# Patient Record
Sex: Male | Born: 1940 | Race: White | Hispanic: No | Marital: Married | Smoking: Former smoker
Health system: Southern US, Community
[De-identification: ages and names within clinical notes are randomized; demographics above are authoritative.]

## PROBLEM LIST (undated history)

## (undated) DIAGNOSIS — E119 Type 2 diabetes mellitus without complications: Secondary | ICD-10-CM

## (undated) DIAGNOSIS — N400 Enlarged prostate without lower urinary tract symptoms: Secondary | ICD-10-CM

## (undated) DIAGNOSIS — I2699 Other pulmonary embolism without acute cor pulmonale: Secondary | ICD-10-CM

## (undated) DIAGNOSIS — C4490 Unspecified malignant neoplasm of skin, unspecified: Secondary | ICD-10-CM

## (undated) DIAGNOSIS — I6381 Other cerebral infarction due to occlusion or stenosis of small artery: Secondary | ICD-10-CM

## (undated) DIAGNOSIS — C189 Malignant neoplasm of colon, unspecified: Secondary | ICD-10-CM

## (undated) DIAGNOSIS — K7460 Unspecified cirrhosis of liver: Secondary | ICD-10-CM

## (undated) DIAGNOSIS — I4891 Unspecified atrial fibrillation: Secondary | ICD-10-CM

## (undated) DIAGNOSIS — E039 Hypothyroidism, unspecified: Secondary | ICD-10-CM

## (undated) DIAGNOSIS — I499 Cardiac arrhythmia, unspecified: Secondary | ICD-10-CM

## (undated) DIAGNOSIS — I4892 Unspecified atrial flutter: Secondary | ICD-10-CM

## (undated) DIAGNOSIS — D649 Anemia, unspecified: Secondary | ICD-10-CM

## (undated) HISTORY — DX: Hypothyroidism, unspecified: E03.9

## (undated) HISTORY — PX: COLOSTOMY: SHX63

## (undated) HISTORY — DX: Other cerebral infarction due to occlusion or stenosis of small artery: I63.81

## (undated) HISTORY — DX: Unspecified malignant neoplasm of skin, unspecified: C44.90

## (undated) HISTORY — DX: Benign prostatic hyperplasia without lower urinary tract symptoms: N40.0

## (undated) HISTORY — DX: Other pulmonary embolism without acute cor pulmonale: I26.99

## (undated) SURGERY — MINOR REMOVAL PORT-A-CATH
Anesthesia: LOCAL

---

## 2005-08-09 HISTORY — PX: LAPAROSCOPIC CHOLECYSTECTOMY: SUR755

## 2006-08-09 HISTORY — PX: COLECTOMY: SHX59

## 2007-01-12 DIAGNOSIS — C189 Malignant neoplasm of colon, unspecified: Secondary | ICD-10-CM | POA: Insufficient documentation

## 2007-02-13 ENCOUNTER — Ambulatory Visit (HOSPITAL_COMMUNITY): Admission: RE | Admit: 2007-02-13 | Discharge: 2007-02-13 | Payer: Self-pay

## 2007-08-10 HISTORY — PX: ABDOMINOPERINEAL PROCTOCOLECTOMY: SUR8

## 2007-08-29 ENCOUNTER — Ambulatory Visit (HOSPITAL_COMMUNITY): Admission: RE | Admit: 2007-08-29 | Discharge: 2007-08-29 | Payer: Self-pay | Admitting: Internal Medicine

## 2007-12-11 ENCOUNTER — Ambulatory Visit: Admission: RE | Admit: 2007-12-11 | Discharge: 2008-02-16 | Payer: Self-pay | Admitting: Radiation Oncology

## 2008-05-20 ENCOUNTER — Ambulatory Visit (HOSPITAL_COMMUNITY): Admission: RE | Admit: 2008-05-20 | Discharge: 2008-05-20 | Payer: Self-pay | Admitting: Internal Medicine

## 2009-04-15 ENCOUNTER — Ambulatory Visit (HOSPITAL_COMMUNITY): Admission: RE | Admit: 2009-04-15 | Discharge: 2009-04-15 | Payer: Self-pay | Admitting: Internal Medicine

## 2009-06-09 HISTORY — PX: OTHER SURGICAL HISTORY: SHX169

## 2012-02-25 ENCOUNTER — Encounter: Payer: Self-pay | Admitting: Internal Medicine

## 2012-02-25 DIAGNOSIS — C189 Malignant neoplasm of colon, unspecified: Secondary | ICD-10-CM

## 2012-11-07 DIAGNOSIS — I6381 Other cerebral infarction due to occlusion or stenosis of small artery: Secondary | ICD-10-CM

## 2012-11-07 HISTORY — DX: Other cerebral infarction due to occlusion or stenosis of small artery: I63.81

## 2013-02-26 DIAGNOSIS — C189 Malignant neoplasm of colon, unspecified: Secondary | ICD-10-CM

## 2015-08-10 HISTORY — PX: EXPLORATORY LAPAROTOMY: SUR591

## 2015-08-28 DIAGNOSIS — H2513 Age-related nuclear cataract, bilateral: Secondary | ICD-10-CM | POA: Diagnosis not present

## 2015-10-09 DIAGNOSIS — R509 Fever, unspecified: Secondary | ICD-10-CM | POA: Diagnosis not present

## 2015-10-09 DIAGNOSIS — R05 Cough: Secondary | ICD-10-CM | POA: Diagnosis not present

## 2015-10-09 DIAGNOSIS — Z299 Encounter for prophylactic measures, unspecified: Secondary | ICD-10-CM | POA: Diagnosis not present

## 2015-10-09 DIAGNOSIS — J3489 Other specified disorders of nose and nasal sinuses: Secondary | ICD-10-CM | POA: Diagnosis not present

## 2015-10-09 DIAGNOSIS — Z87891 Personal history of nicotine dependence: Secondary | ICD-10-CM | POA: Diagnosis not present

## 2015-10-09 DIAGNOSIS — J329 Chronic sinusitis, unspecified: Secondary | ICD-10-CM | POA: Diagnosis not present

## 2015-10-29 DIAGNOSIS — E669 Obesity, unspecified: Secondary | ICD-10-CM | POA: Diagnosis not present

## 2015-10-29 DIAGNOSIS — K469 Unspecified abdominal hernia without obstruction or gangrene: Secondary | ICD-10-CM | POA: Diagnosis not present

## 2015-10-29 DIAGNOSIS — Z933 Colostomy status: Secondary | ICD-10-CM | POA: Diagnosis not present

## 2015-10-29 DIAGNOSIS — E119 Type 2 diabetes mellitus without complications: Secondary | ICD-10-CM | POA: Diagnosis not present

## 2015-10-29 DIAGNOSIS — R188 Other ascites: Secondary | ICD-10-CM | POA: Diagnosis not present

## 2015-10-29 DIAGNOSIS — I10 Essential (primary) hypertension: Secondary | ICD-10-CM | POA: Diagnosis not present

## 2015-10-29 DIAGNOSIS — R1084 Generalized abdominal pain: Secondary | ICD-10-CM | POA: Diagnosis not present

## 2015-10-29 DIAGNOSIS — R7989 Other specified abnormal findings of blood chemistry: Secondary | ICD-10-CM | POA: Diagnosis not present

## 2015-10-29 DIAGNOSIS — Z7982 Long term (current) use of aspirin: Secondary | ICD-10-CM | POA: Diagnosis not present

## 2015-10-29 DIAGNOSIS — K5900 Constipation, unspecified: Secondary | ICD-10-CM | POA: Diagnosis not present

## 2015-10-29 DIAGNOSIS — E039 Hypothyroidism, unspecified: Secondary | ICD-10-CM | POA: Diagnosis not present

## 2015-10-29 DIAGNOSIS — R61 Generalized hyperhidrosis: Secondary | ICD-10-CM | POA: Diagnosis not present

## 2015-10-29 DIAGNOSIS — R109 Unspecified abdominal pain: Secondary | ICD-10-CM | POA: Diagnosis not present

## 2015-10-29 DIAGNOSIS — K449 Diaphragmatic hernia without obstruction or gangrene: Secondary | ICD-10-CM | POA: Diagnosis not present

## 2015-10-29 DIAGNOSIS — Z79899 Other long term (current) drug therapy: Secondary | ICD-10-CM | POA: Diagnosis not present

## 2015-10-29 DIAGNOSIS — Z8249 Family history of ischemic heart disease and other diseases of the circulatory system: Secondary | ICD-10-CM | POA: Diagnosis not present

## 2015-10-29 DIAGNOSIS — N179 Acute kidney failure, unspecified: Secondary | ICD-10-CM | POA: Diagnosis not present

## 2015-10-29 DIAGNOSIS — Z9049 Acquired absence of other specified parts of digestive tract: Secondary | ICD-10-CM | POA: Diagnosis not present

## 2015-10-29 DIAGNOSIS — R338 Other retention of urine: Secondary | ICD-10-CM | POA: Diagnosis not present

## 2015-10-29 DIAGNOSIS — Z87891 Personal history of nicotine dependence: Secondary | ICD-10-CM | POA: Diagnosis not present

## 2015-10-29 DIAGNOSIS — R339 Retention of urine, unspecified: Secondary | ICD-10-CM | POA: Diagnosis not present

## 2015-10-29 DIAGNOSIS — Z7984 Long term (current) use of oral hypoglycemic drugs: Secondary | ICD-10-CM | POA: Diagnosis not present

## 2015-10-29 DIAGNOSIS — Z85038 Personal history of other malignant neoplasm of large intestine: Secondary | ICD-10-CM | POA: Diagnosis not present

## 2015-10-29 DIAGNOSIS — N400 Enlarged prostate without lower urinary tract symptoms: Secondary | ICD-10-CM | POA: Diagnosis not present

## 2015-10-29 DIAGNOSIS — Z6829 Body mass index (BMI) 29.0-29.9, adult: Secondary | ICD-10-CM | POA: Diagnosis not present

## 2015-10-30 DIAGNOSIS — K631 Perforation of intestine (nontraumatic): Secondary | ICD-10-CM | POA: Diagnosis not present

## 2015-10-30 DIAGNOSIS — R1084 Generalized abdominal pain: Secondary | ICD-10-CM | POA: Diagnosis not present

## 2015-10-30 DIAGNOSIS — Z9049 Acquired absence of other specified parts of digestive tract: Secondary | ICD-10-CM | POA: Diagnosis not present

## 2015-10-30 DIAGNOSIS — Z7982 Long term (current) use of aspirin: Secondary | ICD-10-CM | POA: Diagnosis not present

## 2015-10-30 DIAGNOSIS — Z9989 Dependence on other enabling machines and devices: Secondary | ICD-10-CM | POA: Diagnosis not present

## 2015-10-30 DIAGNOSIS — Z933 Colostomy status: Secondary | ICD-10-CM | POA: Diagnosis not present

## 2015-10-30 DIAGNOSIS — R1033 Periumbilical pain: Secondary | ICD-10-CM | POA: Diagnosis not present

## 2015-10-30 DIAGNOSIS — R509 Fever, unspecified: Secondary | ICD-10-CM | POA: Diagnosis not present

## 2015-10-30 DIAGNOSIS — K668 Other specified disorders of peritoneum: Secondary | ICD-10-CM | POA: Diagnosis not present

## 2015-10-30 DIAGNOSIS — I10 Essential (primary) hypertension: Secondary | ICD-10-CM | POA: Diagnosis not present

## 2015-10-30 DIAGNOSIS — E876 Hypokalemia: Secondary | ICD-10-CM | POA: Diagnosis not present

## 2015-10-30 DIAGNOSIS — E039 Hypothyroidism, unspecified: Secondary | ICD-10-CM | POA: Diagnosis not present

## 2015-10-30 DIAGNOSIS — R066 Hiccough: Secondary | ICD-10-CM | POA: Diagnosis not present

## 2015-10-30 DIAGNOSIS — R339 Retention of urine, unspecified: Secondary | ICD-10-CM | POA: Diagnosis not present

## 2015-10-30 DIAGNOSIS — K659 Peritonitis, unspecified: Secondary | ICD-10-CM | POA: Diagnosis not present

## 2015-10-30 DIAGNOSIS — Z79899 Other long term (current) drug therapy: Secondary | ICD-10-CM | POA: Diagnosis not present

## 2015-10-30 DIAGNOSIS — Z85038 Personal history of other malignant neoplasm of large intestine: Secondary | ICD-10-CM | POA: Diagnosis not present

## 2015-10-30 DIAGNOSIS — E119 Type 2 diabetes mellitus without complications: Secondary | ICD-10-CM | POA: Diagnosis not present

## 2015-10-30 DIAGNOSIS — Z9981 Dependence on supplemental oxygen: Secondary | ICD-10-CM | POA: Diagnosis not present

## 2015-10-30 DIAGNOSIS — R531 Weakness: Secondary | ICD-10-CM | POA: Diagnosis not present

## 2015-10-30 DIAGNOSIS — R109 Unspecified abdominal pain: Secondary | ICD-10-CM | POA: Diagnosis not present

## 2015-10-30 DIAGNOSIS — R102 Pelvic and perineal pain: Secondary | ICD-10-CM | POA: Diagnosis not present

## 2015-10-31 DIAGNOSIS — Z79899 Other long term (current) drug therapy: Secondary | ICD-10-CM | POA: Diagnosis not present

## 2015-10-31 DIAGNOSIS — Z933 Colostomy status: Secondary | ICD-10-CM | POA: Diagnosis not present

## 2015-10-31 DIAGNOSIS — Z7984 Long term (current) use of oral hypoglycemic drugs: Secondary | ICD-10-CM | POA: Diagnosis not present

## 2015-10-31 DIAGNOSIS — K659 Peritonitis, unspecified: Secondary | ICD-10-CM | POA: Diagnosis present

## 2015-10-31 DIAGNOSIS — K668 Other specified disorders of peritoneum: Secondary | ICD-10-CM | POA: Insufficient documentation

## 2015-10-31 DIAGNOSIS — Z7982 Long term (current) use of aspirin: Secondary | ICD-10-CM | POA: Diagnosis not present

## 2015-10-31 DIAGNOSIS — E039 Hypothyroidism, unspecified: Secondary | ICD-10-CM | POA: Diagnosis present

## 2015-10-31 DIAGNOSIS — K660 Peritoneal adhesions (postprocedural) (postinfection): Secondary | ICD-10-CM | POA: Diagnosis not present

## 2015-10-31 DIAGNOSIS — K658 Other peritonitis: Secondary | ICD-10-CM | POA: Diagnosis not present

## 2015-10-31 DIAGNOSIS — E876 Hypokalemia: Secondary | ICD-10-CM | POA: Diagnosis not present

## 2015-10-31 DIAGNOSIS — Z85038 Personal history of other malignant neoplasm of large intestine: Secondary | ICD-10-CM | POA: Diagnosis not present

## 2015-10-31 DIAGNOSIS — R066 Hiccough: Secondary | ICD-10-CM | POA: Diagnosis not present

## 2015-10-31 DIAGNOSIS — R188 Other ascites: Secondary | ICD-10-CM | POA: Diagnosis not present

## 2015-10-31 DIAGNOSIS — Z79890 Hormone replacement therapy (postmenopausal): Secondary | ICD-10-CM | POA: Diagnosis not present

## 2015-10-31 DIAGNOSIS — Z9221 Personal history of antineoplastic chemotherapy: Secondary | ICD-10-CM | POA: Diagnosis not present

## 2015-10-31 DIAGNOSIS — E119 Type 2 diabetes mellitus without complications: Secondary | ICD-10-CM | POA: Diagnosis present

## 2015-10-31 DIAGNOSIS — Z8673 Personal history of transient ischemic attack (TIA), and cerebral infarction without residual deficits: Secondary | ICD-10-CM | POA: Diagnosis not present

## 2015-10-31 DIAGNOSIS — K435 Parastomal hernia without obstruction or  gangrene: Secondary | ICD-10-CM | POA: Diagnosis present

## 2015-10-31 DIAGNOSIS — I10 Essential (primary) hypertension: Secondary | ICD-10-CM | POA: Diagnosis present

## 2015-10-31 DIAGNOSIS — Z923 Personal history of irradiation: Secondary | ICD-10-CM | POA: Diagnosis not present

## 2015-11-11 DIAGNOSIS — Z9889 Other specified postprocedural states: Secondary | ICD-10-CM | POA: Insufficient documentation

## 2015-11-11 DIAGNOSIS — Z48815 Encounter for surgical aftercare following surgery on the digestive system: Secondary | ICD-10-CM | POA: Diagnosis not present

## 2015-11-11 DIAGNOSIS — R109 Unspecified abdominal pain: Secondary | ICD-10-CM | POA: Diagnosis not present

## 2015-11-12 DIAGNOSIS — R079 Chest pain, unspecified: Secondary | ICD-10-CM | POA: Diagnosis not present

## 2015-11-12 DIAGNOSIS — R0600 Dyspnea, unspecified: Secondary | ICD-10-CM | POA: Diagnosis not present

## 2015-11-12 DIAGNOSIS — R0602 Shortness of breath: Secondary | ICD-10-CM | POA: Diagnosis not present

## 2015-11-12 DIAGNOSIS — I2699 Other pulmonary embolism without acute cor pulmonale: Secondary | ICD-10-CM | POA: Diagnosis not present

## 2015-11-13 DIAGNOSIS — Z7984 Long term (current) use of oral hypoglycemic drugs: Secondary | ICD-10-CM | POA: Diagnosis not present

## 2015-11-13 DIAGNOSIS — I5189 Other ill-defined heart diseases: Secondary | ICD-10-CM | POA: Diagnosis not present

## 2015-11-13 DIAGNOSIS — E119 Type 2 diabetes mellitus without complications: Secondary | ICD-10-CM | POA: Diagnosis not present

## 2015-11-13 DIAGNOSIS — R0600 Dyspnea, unspecified: Secondary | ICD-10-CM | POA: Diagnosis not present

## 2015-11-13 DIAGNOSIS — Z79899 Other long term (current) drug therapy: Secondary | ICD-10-CM | POA: Diagnosis not present

## 2015-11-13 DIAGNOSIS — N400 Enlarged prostate without lower urinary tract symptoms: Secondary | ICD-10-CM | POA: Diagnosis not present

## 2015-11-13 DIAGNOSIS — R079 Chest pain, unspecified: Secondary | ICD-10-CM | POA: Diagnosis not present

## 2015-11-13 DIAGNOSIS — Z85038 Personal history of other malignant neoplasm of large intestine: Secondary | ICD-10-CM | POA: Diagnosis not present

## 2015-11-13 DIAGNOSIS — E039 Hypothyroidism, unspecified: Secondary | ICD-10-CM | POA: Diagnosis not present

## 2015-11-13 DIAGNOSIS — R0602 Shortness of breath: Secondary | ICD-10-CM | POA: Diagnosis not present

## 2015-11-13 DIAGNOSIS — I2699 Other pulmonary embolism without acute cor pulmonale: Secondary | ICD-10-CM | POA: Diagnosis not present

## 2015-11-14 DIAGNOSIS — I2699 Other pulmonary embolism without acute cor pulmonale: Secondary | ICD-10-CM | POA: Diagnosis not present

## 2015-11-20 DIAGNOSIS — I2699 Other pulmonary embolism without acute cor pulmonale: Secondary | ICD-10-CM | POA: Diagnosis not present

## 2015-11-20 DIAGNOSIS — Z299 Encounter for prophylactic measures, unspecified: Secondary | ICD-10-CM | POA: Diagnosis not present

## 2015-11-20 DIAGNOSIS — Z09 Encounter for follow-up examination after completed treatment for conditions other than malignant neoplasm: Secondary | ICD-10-CM | POA: Diagnosis not present

## 2015-11-20 DIAGNOSIS — I10 Essential (primary) hypertension: Secondary | ICD-10-CM | POA: Diagnosis not present

## 2015-11-20 DIAGNOSIS — E1165 Type 2 diabetes mellitus with hyperglycemia: Secondary | ICD-10-CM | POA: Diagnosis not present

## 2015-12-05 DIAGNOSIS — E1165 Type 2 diabetes mellitus with hyperglycemia: Secondary | ICD-10-CM | POA: Diagnosis not present

## 2015-12-05 DIAGNOSIS — I10 Essential (primary) hypertension: Secondary | ICD-10-CM | POA: Diagnosis not present

## 2015-12-05 DIAGNOSIS — F419 Anxiety disorder, unspecified: Secondary | ICD-10-CM | POA: Diagnosis not present

## 2015-12-05 DIAGNOSIS — I2699 Other pulmonary embolism without acute cor pulmonale: Secondary | ICD-10-CM | POA: Diagnosis not present

## 2015-12-09 ENCOUNTER — Encounter: Payer: Self-pay | Admitting: *Deleted

## 2015-12-10 ENCOUNTER — Ambulatory Visit (INDEPENDENT_AMBULATORY_CARE_PROVIDER_SITE_OTHER): Payer: Medicare Other | Admitting: Cardiology

## 2015-12-10 ENCOUNTER — Encounter: Payer: Self-pay | Admitting: *Deleted

## 2015-12-10 VITALS — BP 130/76 | HR 90 | Ht 71.0 in | Wt 215.0 lb

## 2015-12-10 DIAGNOSIS — R0602 Shortness of breath: Secondary | ICD-10-CM

## 2015-12-10 DIAGNOSIS — I2782 Chronic pulmonary embolism: Secondary | ICD-10-CM | POA: Diagnosis not present

## 2015-12-10 NOTE — Progress Notes (Signed)
Patient ID: Jose Lawrence, male   DOB: 12-Nov-1940, 75 y.o.   MRN: [id number]     Clinical Summary Mr. Deblanc is a 75 y.o.male seen today as a new patient for the following medical problems, he is referred by Dr Woody Seller  1. History of PE/SOB - history of large bilateral PE with RV strain by CT during admit at [facility] 11/2015 - echo 11/2015 report indicated normal RV function, no LVEF reported. PASP 45. Fairly limited details in echo report.  - recent abdominal surgery 10/2015 at [facility] for peforated bowel, suspected trigger for PE - no history of previous blood clot - several months of DOE, started even prior to recent PE. No history of chest pain.  - gradual onset of SOB/DOE over last several months. Occasional cough and wheezing.  - no recent LE edema, no orthopnea.  -CAD risk factors: DM2, HTN, former smoke, father CABG in 59s     Past Medical History  Diagnosis Date  . Hypothyroidism   . PE (pulmonary embolism)     on eliquis 11-2015  . 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  . Diabetes mellitus ([place])   . BPH (benign prostatic hypertrophy)      No Known Allergies   Current Outpatient Prescriptions  Medication Sig Dispense Refill  . ALPRAZolam (XANAX) 0.5 MG tablet Take 0.5 mg by mouth at bedtime as needed for anxiety.    Marland Kitchen apixaban (ELIQUIS) 5 MG TABS tablet Take 5 mg by mouth 2 (two) times daily.    Marland Kitchen aspirin 81 MG tablet Take 81 mg by mouth daily.    Marland Kitchen docusate sodium (COLACE) 100 MG capsule Take 100 mg by mouth daily as needed for mild constipation.    Marland Kitchen glipiZIDE (GLUCOTROL) 5 MG tablet Take 5 mg by mouth daily before breakfast.    . levothyroxine (SYNTHROID, LEVOTHROID) 50 MCG tablet Take 50 mcg by mouth daily before breakfast.    . lisinopril (PRINIVIL,ZESTRIL) 10 MG tablet Take 10 mg by mouth daily.    . meloxicam (MOBIC) 7.5 MG tablet Take 7.5 mg by mouth daily.    . tamsulosin (FLOMAX) 0.4 MG CAPS capsule Take 0.4  mg by mouth daily.     No current facility-administered medications for this visit.     Past Surgical History  Procedure Laterality Date  . Sbo surgery  06/2009     No Known Allergies    Family History  Problem Relation Age of Onset  . Heart disease Father   . Heart failure Father   . Heart attack Father      Social History Mr. Larick reports that he has quit smoking. He does not have any smokeless tobacco history on file. Mr. Spilman has no alcohol history on file.   Review of Systems CONSTITUTIONAL: No weight loss, fever, chills, weakness or fatigue.  HEENT: Eyes: No visual loss, blurred vision, double vision or yellow sclerae.No hearing loss, sneezing, congestion, runny nose or sore throat.  SKIN: No rash or itching.  CARDIOVASCULAR: per HPI RESPIRATORY: per HPI GASTROINTESTINAL: No anorexia, nausea, vomiting or diarrhea. No abdominal pain or blood.  GENITOURINARY: No burning on urination, no polyuria NEUROLOGICAL: No headache, dizziness, syncope, paralysis, ataxia, numbness or tingling in the extremities. No change in bowel or bladder control.  MUSCULOSKELETAL: No muscle, back pain, joint pain or stiffness.  LYMPHATICS: No enlarged nodes. No history of splenectomy.  PSYCHIATRIC: No history of depression or anxiety.  ENDOCRINOLOGIC: No reports  of sweating, cold or heat intolerance. No polyuria or polydipsia.  Marland Kitchen   Physical Examination Filed Vitals:   12/10/15 1028  BP: 130/76  Pulse: 90   Filed Vitals:   12/10/15 1028  Height: 5\' 11"  (1.803 m)  Weight: 215 lb (97.523 kg)    Gen: resting comfortably, no acute distress HEENT: no scleral icterus, pupils equal round and reactive, no palptable cervical adenopathy,  CV: RRR, no m/r/g, no jvd Resp: Clear to auscultation bilaterally GI: abdomen is soft, non-tender, non-distended, normal bowel sounds, no hepatosplenomegaly MSK: extremities are warm, no edema.  Skin: warm, no rash Neuro:  no focal  deficits Psych: appropriate affect    Assessment and Plan  1. SOB - gradual onset over the last several months, symptoms started prior to recent PE and continue 1 month after PE - we will repeat his echo as prior echo report is limited, and wish to reevaluate RV function and PASP after recent PE   2. PE - likely provoked due to recent abdominal surgery - he is on eliquis, continue anticoagulation - f/u echo in setting of ongoing SOB/DOE  F/u 1 month      Arnoldo Lenis, M.D.,

## 2015-12-10 NOTE — Patient Instructions (Signed)
Your physician has requested that you have an echocardiogram. Echocardiography is a painless test that uses sound waves to create images of your heart. It provides your doctor with information about the size and shape of your heart and how well your heart's chambers and valves are working. This procedure takes approximately one hour. There are no restrictions for this procedure. Office will contact with results via phone or letter.   Continue all current medications. Follow up in  1 month  

## 2015-12-11 ENCOUNTER — Other Ambulatory Visit: Payer: Self-pay

## 2015-12-11 ENCOUNTER — Telehealth: Payer: Self-pay | Admitting: Cardiology

## 2015-12-11 ENCOUNTER — Telehealth: Payer: Self-pay | Admitting: *Deleted

## 2015-12-11 ENCOUNTER — Ambulatory Visit (INDEPENDENT_AMBULATORY_CARE_PROVIDER_SITE_OTHER): Payer: Medicare Other

## 2015-12-11 DIAGNOSIS — R0602 Shortness of breath: Secondary | ICD-10-CM | POA: Diagnosis not present

## 2015-12-11 DIAGNOSIS — I4892 Unspecified atrial flutter: Secondary | ICD-10-CM

## 2015-12-11 MED ORDER — METOPROLOL TARTRATE 25 MG PO TABS: 25.0000 mg | ORAL_TABLET | Freq: Two times a day (BID) | ORAL | Status: DC

## 2015-12-11 NOTE — Telephone Encounter (Signed)
Pt voiced understanding. Medication sent to pharmacy. Routed to pcp

## 2015-12-11 NOTE — Telephone Encounter (Signed)
Patient in clinic today for echo. Echo technician identified abnormal rhythm, 12 lead EKG done shows aflutter heart rate of 70. This is a new finding for this patient. He denies any palpitations. Unclear if paroxysmal aflutter could be playing a role in his fatigue and SOB. We will start lopressor 12.5mg  bid, CHADS2Vasc score of 2(HTN, DM2), he is already on eliquis for recent PE, continue.   Zandra Abts MD

## 2015-12-11 NOTE — Telephone Encounter (Signed)
-----   Message from Arnoldo Lenis, MD sent at 12/11/2015  4:21 PM EDT ----- Please let patient know that overall his echo looks good, his heart function is normal. For the abnormal heart rhythm we talked about today (aflutter) please start him on lopressor 12.5mg  bid  J BrancH MD

## 2015-12-12 DIAGNOSIS — I10 Essential (primary) hypertension: Secondary | ICD-10-CM | POA: Diagnosis not present

## 2015-12-12 DIAGNOSIS — E119 Type 2 diabetes mellitus without complications: Secondary | ICD-10-CM | POA: Diagnosis not present

## 2015-12-12 NOTE — Addendum Note (Signed)
Addended by: Merlene Laughter on: 12/12/2015 03:51 PM   Modules accepted: Orders

## 2015-12-16 DIAGNOSIS — I4891 Unspecified atrial fibrillation: Secondary | ICD-10-CM | POA: Diagnosis not present

## 2015-12-16 DIAGNOSIS — I2699 Other pulmonary embolism without acute cor pulmonale: Secondary | ICD-10-CM | POA: Diagnosis not present

## 2015-12-16 DIAGNOSIS — Z6829 Body mass index (BMI) 29.0-29.9, adult: Secondary | ICD-10-CM | POA: Diagnosis not present

## 2015-12-16 DIAGNOSIS — E1165 Type 2 diabetes mellitus with hyperglycemia: Secondary | ICD-10-CM | POA: Diagnosis not present

## 2015-12-16 DIAGNOSIS — I10 Essential (primary) hypertension: Secondary | ICD-10-CM | POA: Diagnosis not present

## 2015-12-24 ENCOUNTER — Telehealth: Payer: Self-pay | Admitting: Cardiology

## 2015-12-24 MED ORDER — DILTIAZEM HCL 30 MG PO TABS: 30.0000 mg | ORAL_TABLET | Freq: Four times a day (QID) | ORAL | Status: DC

## 2015-12-24 NOTE — Telephone Encounter (Signed)
Called pt to see what the problem is with the metoprolol and he stated that he is so depressed, has no energy at all, stays dizzy headed and has worsening sob since starting the medication. He stated he will no be taking any more. Please advise.

## 2015-12-24 NOTE — Telephone Encounter (Signed)
Pt called stating he is unable to take his metoprolol tartrate (LOPRESSOR) 25 MG tablet [id number] says it's driving him crazy

## 2015-12-24 NOTE — Telephone Encounter (Signed)
Let pt know to start diltiazem 30 mg daily. He stated he will wait about 10 days before he starts because he wants to be back in his right mind after going off the metoprolol. Sent in rx .

## 2015-12-24 NOTE — Telephone Encounter (Signed)
Ok to stop metoprolol but would recommend starting short acting diltiazem 30mg  bid instead.   Zandra Abts MD

## 2016-01-16 DIAGNOSIS — Z8673 Personal history of transient ischemic attack (TIA), and cerebral infarction without residual deficits: Secondary | ICD-10-CM | POA: Diagnosis not present

## 2016-01-16 DIAGNOSIS — Z833 Family history of diabetes mellitus: Secondary | ICD-10-CM | POA: Diagnosis not present

## 2016-01-16 DIAGNOSIS — K922 Gastrointestinal hemorrhage, unspecified: Secondary | ICD-10-CM | POA: Diagnosis present

## 2016-01-16 DIAGNOSIS — E1165 Type 2 diabetes mellitus with hyperglycemia: Secondary | ICD-10-CM | POA: Diagnosis not present

## 2016-01-16 DIAGNOSIS — I10 Essential (primary) hypertension: Secondary | ICD-10-CM | POA: Diagnosis not present

## 2016-01-16 DIAGNOSIS — Z86711 Personal history of pulmonary embolism: Secondary | ICD-10-CM | POA: Diagnosis not present

## 2016-01-16 DIAGNOSIS — Z79899 Other long term (current) drug therapy: Secondary | ICD-10-CM | POA: Diagnosis not present

## 2016-01-16 DIAGNOSIS — N400 Enlarged prostate without lower urinary tract symptoms: Secondary | ICD-10-CM | POA: Diagnosis present

## 2016-01-16 DIAGNOSIS — I4891 Unspecified atrial fibrillation: Secondary | ICD-10-CM | POA: Diagnosis present

## 2016-01-16 DIAGNOSIS — E119 Type 2 diabetes mellitus without complications: Secondary | ICD-10-CM | POA: Diagnosis present

## 2016-01-16 DIAGNOSIS — Z299 Encounter for prophylactic measures, unspecified: Secondary | ICD-10-CM | POA: Diagnosis not present

## 2016-01-16 DIAGNOSIS — D649 Anemia, unspecified: Secondary | ICD-10-CM | POA: Diagnosis not present

## 2016-01-16 DIAGNOSIS — I2699 Other pulmonary embolism without acute cor pulmonale: Secondary | ICD-10-CM | POA: Diagnosis not present

## 2016-01-16 DIAGNOSIS — R0609 Other forms of dyspnea: Secondary | ICD-10-CM | POA: Diagnosis not present

## 2016-01-16 DIAGNOSIS — E039 Hypothyroidism, unspecified: Secondary | ICD-10-CM | POA: Diagnosis present

## 2016-01-16 DIAGNOSIS — D62 Acute posthemorrhagic anemia: Secondary | ICD-10-CM | POA: Diagnosis present

## 2016-01-16 DIAGNOSIS — Z85038 Personal history of other malignant neoplasm of large intestine: Secondary | ICD-10-CM | POA: Diagnosis not present

## 2016-01-16 DIAGNOSIS — Z933 Colostomy status: Secondary | ICD-10-CM | POA: Diagnosis not present

## 2016-01-16 DIAGNOSIS — Z7901 Long term (current) use of anticoagulants: Secondary | ICD-10-CM | POA: Diagnosis not present

## 2016-01-16 DIAGNOSIS — R0602 Shortness of breath: Secondary | ICD-10-CM | POA: Diagnosis not present

## 2016-01-16 DIAGNOSIS — Z8249 Family history of ischemic heart disease and other diseases of the circulatory system: Secondary | ICD-10-CM | POA: Diagnosis not present

## 2016-01-16 DIAGNOSIS — D500 Iron deficiency anemia secondary to blood loss (chronic): Secondary | ICD-10-CM | POA: Diagnosis not present

## 2016-01-19 ENCOUNTER — Ambulatory Visit: Payer: PRIVATE HEALTH INSURANCE | Admitting: Cardiology

## 2016-01-20 DIAGNOSIS — K922 Gastrointestinal hemorrhage, unspecified: Secondary | ICD-10-CM | POA: Diagnosis not present

## 2016-01-20 DIAGNOSIS — D649 Anemia, unspecified: Secondary | ICD-10-CM | POA: Diagnosis not present

## 2016-01-20 DIAGNOSIS — E119 Type 2 diabetes mellitus without complications: Secondary | ICD-10-CM | POA: Diagnosis not present

## 2016-01-20 DIAGNOSIS — Z85038 Personal history of other malignant neoplasm of large intestine: Secondary | ICD-10-CM | POA: Diagnosis not present

## 2016-01-20 DIAGNOSIS — Z7901 Long term (current) use of anticoagulants: Secondary | ICD-10-CM | POA: Diagnosis not present

## 2016-01-20 DIAGNOSIS — Z8673 Personal history of transient ischemic attack (TIA), and cerebral infarction without residual deficits: Secondary | ICD-10-CM | POA: Diagnosis not present

## 2016-01-20 DIAGNOSIS — I10 Essential (primary) hypertension: Secondary | ICD-10-CM | POA: Diagnosis not present

## 2016-01-20 DIAGNOSIS — D500 Iron deficiency anemia secondary to blood loss (chronic): Secondary | ICD-10-CM | POA: Diagnosis not present

## 2016-01-20 DIAGNOSIS — Z933 Colostomy status: Secondary | ICD-10-CM | POA: Diagnosis not present

## 2016-01-20 DIAGNOSIS — R5383 Other fatigue: Secondary | ICD-10-CM | POA: Diagnosis not present

## 2016-01-29 DIAGNOSIS — Z09 Encounter for follow-up examination after completed treatment for conditions other than malignant neoplasm: Secondary | ICD-10-CM | POA: Diagnosis not present

## 2016-01-29 DIAGNOSIS — D649 Anemia, unspecified: Secondary | ICD-10-CM | POA: Diagnosis not present

## 2016-01-29 DIAGNOSIS — Z299 Encounter for prophylactic measures, unspecified: Secondary | ICD-10-CM | POA: Diagnosis not present

## 2016-01-29 DIAGNOSIS — K922 Gastrointestinal hemorrhage, unspecified: Secondary | ICD-10-CM | POA: Diagnosis not present

## 2016-02-12 DIAGNOSIS — Z85038 Personal history of other malignant neoplasm of large intestine: Secondary | ICD-10-CM | POA: Diagnosis not present

## 2016-02-12 DIAGNOSIS — D500 Iron deficiency anemia secondary to blood loss (chronic): Secondary | ICD-10-CM | POA: Diagnosis not present

## 2016-02-16 ENCOUNTER — Encounter: Payer: Self-pay | Admitting: *Deleted

## 2016-02-16 ENCOUNTER — Ambulatory Visit (INDEPENDENT_AMBULATORY_CARE_PROVIDER_SITE_OTHER): Payer: Medicare Other | Admitting: Cardiology

## 2016-02-16 ENCOUNTER — Encounter: Payer: Self-pay | Admitting: Cardiology

## 2016-02-16 VITALS — BP 137/87 | HR 70 | Ht 71.0 in | Wt 217.6 lb

## 2016-02-16 DIAGNOSIS — I10 Essential (primary) hypertension: Secondary | ICD-10-CM | POA: Diagnosis not present

## 2016-02-16 DIAGNOSIS — I4892 Unspecified atrial flutter: Secondary | ICD-10-CM | POA: Diagnosis not present

## 2016-02-16 DIAGNOSIS — I2782 Chronic pulmonary embolism: Secondary | ICD-10-CM

## 2016-02-16 MED ORDER — METOPROLOL TARTRATE 25 MG PO TABS: 12.5000 mg | ORAL_TABLET | Freq: Two times a day (BID) | ORAL | Status: DC

## 2016-02-16 NOTE — Patient Instructions (Signed)
Your physician wants you to follow-up in: [place] DR. BRANCH You will receive a reminder letter in the mail two months in advance. If you don't receive a letter, please call our office to schedule the follow-up appointment.  Your physician has recommended you make the following change in your medication:   RESTART METOPROLOL 12.5 MG TWICE DAILY  Your physician recommends that you return for lab work CBC/BMP  Thank you for choosing [facility]!!

## 2016-02-16 NOTE — Progress Notes (Signed)
Clinical Summary Jose Lawrence is a 75 y.o.male seen today for follow up of the following medical problems.   1. History of PE/SOB - history of large bilateral PE with RV strain by CT during admit at [facility] 11/2015 - echo 11/2015 report indicated normal RV function, no LVEF reported. PASP 45. Fairly limited details in echo report.  - recent abdominal surgery 10/2015 at [facility] for peforated bowel, suspected trigger for PE - anticoag stopped recently due to severe anemia, with recent admission 01/2016 at [facility]. Hgb in the 6s, received 4 total units of pRBCs. He reports negative GI workup. Has been off anticoag since, Hgb followed as outpatient by pcp   2. Aflutter - new diagnosis 12/2015 - started on lopressor, he had already been on anticoagualation due to recent PE.  - stopped metoprolol due to SOB and weakness. Took for about 1 week. Did not fill Rx for diltiazem. He thinks now that his fatigue was probably more related to his severe anemia at the time.  - eliquis $300, xarelto was same price. Currently has samples at home.  - reports occasional palpitaitons.   3. Anemia - reports Hgb in 6s recently, tranfused blood during recent admission.  - he has stopped takling his eliquis. - has not restarted eliquis since discharge.  - H&H followed as outpatient by pcp  Past Medical History  Diagnosis Date  . Hypothyroidism   . PE (pulmonary embolism)     on eliquis 11-2015  . 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  . Diabetes mellitus ([place])   . BPH (benign prostatic hypertrophy)      Allergies  Allergen Reactions  . Metoprolol Tartrate Shortness Of Breath, Anxiety and Other (See Comments)    Pt says medication caused severe depression " felt like he was going to die and was going crazy"     Current Outpatient Prescriptions  Medication Sig Dispense Refill  . ALPRAZolam (XANAX) 0.5 MG tablet Take 0.5 mg by mouth at bedtime as  needed for anxiety.    Marland Kitchen apixaban (ELIQUIS) 5 MG TABS tablet Take 5 mg by mouth 2 (two) times daily.    Marland Kitchen diltiazem (CARDIZEM) 30 MG tablet Take 1 tablet (30 mg total) by mouth 4 (four) times daily. 30 tablet 3  . glipiZIDE (GLUCOTROL) 5 MG tablet Take 10 mg by mouth daily before breakfast. & 5 mg in the evening    . levothyroxine (SYNTHROID, LEVOTHROID) 50 MCG tablet Take 50 mcg by mouth daily before breakfast.    . lisinopril (PRINIVIL,ZESTRIL) 10 MG tablet Take 10 mg by mouth daily.    . tamsulosin (FLOMAX) 0.4 MG CAPS capsule Take 0.4 mg by mouth daily.     No current facility-administered medications for this visit.     Past Surgical History  Procedure Laterality Date  . Sbo surgery  06/2009     Allergies  Allergen Reactions  . Metoprolol Tartrate Shortness Of Breath, Anxiety and Other (See Comments)    Pt says medication caused severe depression " felt like he was going to die and was going crazy"      Family History  Problem Relation Age of Onset  . Heart disease Father   . Heart failure Father   . Heart attack Father      Social History Jose Lawrence reports that he quit smoking about 25 years ago. His smoking use included Cigarettes. He started smoking about 52 years ago. He  has a 27 pack-year smoking history. He quit smokeless tobacco use about 20 years ago. His smokeless tobacco use included Chew. Jose Lawrence has no alcohol history on file.   Review of Systems CONSTITUTIONAL: No weight loss, fever, chills, weakness or fatigue.  HEENT: Eyes: No visual loss, blurred vision, double vision or yellow sclerae.No hearing loss, sneezing, congestion, runny nose or sore throat.  SKIN: No rash or itching.  CARDIOVASCULAR: per HPI RESPIRATORY: No shortness of breath, cough or sputum.  GASTROINTESTINAL: No anorexia, nausea, vomiting or diarrhea. No abdominal pain or blood.  GENITOURINARY: No burning on urination, no polyuria NEUROLOGICAL: No headache, dizziness, syncope,  paralysis, ataxia, numbness or tingling in the extremities. No change in bowel or bladder control.  MUSCULOSKELETAL: No muscle, back pain, joint pain or stiffness.  LYMPHATICS: No enlarged nodes. No history of splenectomy.  PSYCHIATRIC: No history of depression or anxiety.  ENDOCRINOLOGIC: No reports of sweating, cold or heat intolerance. No polyuria or polydipsia.  Marland Kitchen   Physical Examination Filed Vitals:   02/16/16 1529  BP: 137/87  Pulse: 70   Filed Vitals:   02/16/16 1529  Height: 5\' 11"  (1.803 m)  Weight: 217 lb 9.6 oz (98.703 kg)    Gen: resting comfortably, no acute distress HEENT: no scleral icterus, pupils equal round and reactive, no palptable cervical adenopathy,  CV: RRR, no m/r/g, no jvd Resp: Clear to auscultation bilaterally GI: abdomen is soft, non-tender, non-distended, normal bowel sounds, no hepatosplenomegaly MSK: extremities are warm, no edema.  Skin: warm, no rash Neuro:  no focal deficits Psych: appropriate affect   Diagnostic Studies 12/2015 echo Study Conclusions  - Left ventricle: The cavity size was normal. Wall thickness was  increased in a pattern of mild LVH. Systolic function was normal.  The estimated ejection fraction was in the range of 55% to 60%.  Unable to assess diastolic function, patient is in aflutter. Wall  motion was normal; there were no regional wall motion  abnormalities. - Aortic valve: Mildly calcified annulus. Trileaflet; mildly  thickened leaflets. Valve area (VTI): 2.74 cm^2. Valve area  (Vmax): 3.04 cm^2. Valve area (Vmean): 2.25 cm^2. - Mitral valve: Mildly calcified annulus. Mildly thickened leaflets  . - Left atrium: The atrium was moderately dilated. - Right ventricle: The cavity size was mildly dilated. 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 - Atrial septum: There was increased thickness of the  septum,  consistent with lipomatous hypertrophy. No defect or patent  foramen ovale was identified. - Pulmonary arteries: Systolic pressure was moderately increased.  PA peak pressure: 42 mm Hg (S). - Technically adequate study.    Assessment and Plan  1. PE - likely provoked due to recent abdominal surgery - anticoag on hold due to recent admission with severe anemia   2. Paroxysmal aflutter - he will retry low dose lopressor 12.5mg  bid for palpitations - repeat CBC, if stable Hgb plan to restart trial of eliquis. Ultimately once samples have run out if he tolerates anticoag he will need to be changed to coumadin due to costs.    F/u 6 months. Restart eliquis if Hgb stable on upcoming lab check.        Arnoldo Lenis, M.D., F.A.C.C.

## 2016-02-17 DIAGNOSIS — I10 Essential (primary) hypertension: Secondary | ICD-10-CM | POA: Diagnosis not present

## 2016-02-20 ENCOUNTER — Telehealth: Payer: Self-pay | Admitting: *Deleted

## 2016-02-20 NOTE — Telephone Encounter (Signed)
Pt aware and will re start Eliquis - routed to pcp

## 2016-02-20 NOTE — Telephone Encounter (Signed)
-----   Message from Arnoldo Lenis, MD sent at 02/20/2016 12:38 PM EDT ----- Blood counts look good, have him restart his eliquis samples at home.   Zandra Abts MD

## 2016-03-16 DIAGNOSIS — E1165 Type 2 diabetes mellitus with hyperglycemia: Secondary | ICD-10-CM | POA: Diagnosis not present

## 2016-03-16 DIAGNOSIS — D649 Anemia, unspecified: Secondary | ICD-10-CM | POA: Diagnosis not present

## 2016-03-16 DIAGNOSIS — I10 Essential (primary) hypertension: Secondary | ICD-10-CM | POA: Diagnosis not present

## 2016-03-16 DIAGNOSIS — I4891 Unspecified atrial fibrillation: Secondary | ICD-10-CM | POA: Diagnosis not present

## 2016-04-06 DIAGNOSIS — I10 Essential (primary) hypertension: Secondary | ICD-10-CM | POA: Diagnosis not present

## 2016-04-06 DIAGNOSIS — E119 Type 2 diabetes mellitus without complications: Secondary | ICD-10-CM | POA: Diagnosis not present

## 2016-04-09 DIAGNOSIS — Z79899 Other long term (current) drug therapy: Secondary | ICD-10-CM | POA: Diagnosis not present

## 2016-04-09 DIAGNOSIS — Z125 Encounter for screening for malignant neoplasm of prostate: Secondary | ICD-10-CM | POA: Diagnosis not present

## 2016-04-09 DIAGNOSIS — Z1211 Encounter for screening for malignant neoplasm of colon: Secondary | ICD-10-CM | POA: Diagnosis not present

## 2016-04-09 DIAGNOSIS — Z299 Encounter for prophylactic measures, unspecified: Secondary | ICD-10-CM | POA: Diagnosis not present

## 2016-04-09 DIAGNOSIS — Z1389 Encounter for screening for other disorder: Secondary | ICD-10-CM | POA: Diagnosis not present

## 2016-04-09 DIAGNOSIS — R5383 Other fatigue: Secondary | ICD-10-CM | POA: Diagnosis not present

## 2016-04-09 DIAGNOSIS — Z7189 Other specified counseling: Secondary | ICD-10-CM | POA: Diagnosis not present

## 2016-04-09 DIAGNOSIS — Z0000 Encounter for general adult medical examination without abnormal findings: Secondary | ICD-10-CM | POA: Diagnosis not present

## 2016-06-08 DIAGNOSIS — Z23 Encounter for immunization: Secondary | ICD-10-CM | POA: Diagnosis not present

## 2016-06-29 DIAGNOSIS — F419 Anxiety disorder, unspecified: Secondary | ICD-10-CM | POA: Diagnosis not present

## 2016-06-29 DIAGNOSIS — M545 Low back pain: Secondary | ICD-10-CM | POA: Diagnosis not present

## 2016-06-29 DIAGNOSIS — E1165 Type 2 diabetes mellitus with hyperglycemia: Secondary | ICD-10-CM | POA: Diagnosis not present

## 2016-06-29 DIAGNOSIS — Z299 Encounter for prophylactic measures, unspecified: Secondary | ICD-10-CM | POA: Diagnosis not present

## 2016-06-29 DIAGNOSIS — D649 Anemia, unspecified: Secondary | ICD-10-CM | POA: Diagnosis not present

## 2016-08-09 DEATH — deceased

## 2016-09-03 DIAGNOSIS — E1165 Type 2 diabetes mellitus with hyperglycemia: Secondary | ICD-10-CM | POA: Diagnosis not present

## 2016-09-03 DIAGNOSIS — D649 Anemia, unspecified: Secondary | ICD-10-CM | POA: Diagnosis not present

## 2016-09-03 DIAGNOSIS — R5383 Other fatigue: Secondary | ICD-10-CM | POA: Diagnosis not present

## 2016-09-03 DIAGNOSIS — R350 Frequency of micturition: Secondary | ICD-10-CM | POA: Diagnosis not present

## 2016-09-03 DIAGNOSIS — Z79899 Other long term (current) drug therapy: Secondary | ICD-10-CM | POA: Diagnosis not present

## 2016-09-03 DIAGNOSIS — I4891 Unspecified atrial fibrillation: Secondary | ICD-10-CM | POA: Diagnosis not present

## 2016-09-03 DIAGNOSIS — I10 Essential (primary) hypertension: Secondary | ICD-10-CM | POA: Diagnosis not present

## 2016-09-29 DIAGNOSIS — E119 Type 2 diabetes mellitus without complications: Secondary | ICD-10-CM | POA: Diagnosis not present

## 2016-09-29 DIAGNOSIS — I10 Essential (primary) hypertension: Secondary | ICD-10-CM | POA: Diagnosis not present

## 2016-10-21 DIAGNOSIS — Z713 Dietary counseling and surveillance: Secondary | ICD-10-CM | POA: Diagnosis not present

## 2016-10-21 DIAGNOSIS — Z299 Encounter for prophylactic measures, unspecified: Secondary | ICD-10-CM | POA: Diagnosis not present

## 2016-10-21 DIAGNOSIS — I4891 Unspecified atrial fibrillation: Secondary | ICD-10-CM | POA: Diagnosis not present

## 2016-10-21 DIAGNOSIS — I2699 Other pulmonary embolism without acute cor pulmonale: Secondary | ICD-10-CM | POA: Diagnosis not present

## 2016-10-21 DIAGNOSIS — E1165 Type 2 diabetes mellitus with hyperglycemia: Secondary | ICD-10-CM | POA: Diagnosis not present

## 2016-10-21 DIAGNOSIS — F419 Anxiety disorder, unspecified: Secondary | ICD-10-CM | POA: Diagnosis not present

## 2016-10-21 DIAGNOSIS — N400 Enlarged prostate without lower urinary tract symptoms: Secondary | ICD-10-CM | POA: Diagnosis not present

## 2016-10-21 DIAGNOSIS — I10 Essential (primary) hypertension: Secondary | ICD-10-CM | POA: Diagnosis not present

## 2016-10-29 DIAGNOSIS — Z299 Encounter for prophylactic measures, unspecified: Secondary | ICD-10-CM | POA: Diagnosis not present

## 2016-10-29 DIAGNOSIS — I10 Essential (primary) hypertension: Secondary | ICD-10-CM | POA: Diagnosis not present

## 2016-10-29 DIAGNOSIS — I4891 Unspecified atrial fibrillation: Secondary | ICD-10-CM | POA: Diagnosis not present

## 2016-10-29 DIAGNOSIS — E1165 Type 2 diabetes mellitus with hyperglycemia: Secondary | ICD-10-CM | POA: Diagnosis not present

## 2016-10-29 DIAGNOSIS — D649 Anemia, unspecified: Secondary | ICD-10-CM | POA: Diagnosis not present

## 2016-10-29 DIAGNOSIS — Z6830 Body mass index (BMI) 30.0-30.9, adult: Secondary | ICD-10-CM | POA: Diagnosis not present

## 2016-10-29 DIAGNOSIS — K6289 Other specified diseases of anus and rectum: Secondary | ICD-10-CM | POA: Diagnosis not present

## 2016-10-29 DIAGNOSIS — Z713 Dietary counseling and surveillance: Secondary | ICD-10-CM | POA: Diagnosis not present

## 2016-11-15 DIAGNOSIS — Z9049 Acquired absence of other specified parts of digestive tract: Secondary | ICD-10-CM | POA: Diagnosis not present

## 2016-11-15 DIAGNOSIS — R102 Pelvic and perineal pain: Secondary | ICD-10-CM | POA: Diagnosis not present

## 2016-11-19 DIAGNOSIS — I700 Atherosclerosis of aorta: Secondary | ICD-10-CM | POA: Diagnosis not present

## 2016-11-19 DIAGNOSIS — Z85038 Personal history of other malignant neoplasm of large intestine: Secondary | ICD-10-CM | POA: Diagnosis not present

## 2016-11-19 DIAGNOSIS — Z933 Colostomy status: Secondary | ICD-10-CM | POA: Diagnosis not present

## 2016-11-19 DIAGNOSIS — R102 Pelvic and perineal pain: Secondary | ICD-10-CM | POA: Diagnosis not present

## 2016-11-22 DIAGNOSIS — Z9049 Acquired absence of other specified parts of digestive tract: Secondary | ICD-10-CM | POA: Diagnosis not present

## 2017-01-31 DIAGNOSIS — I10 Essential (primary) hypertension: Secondary | ICD-10-CM | POA: Diagnosis not present

## 2017-01-31 DIAGNOSIS — Z299 Encounter for prophylactic measures, unspecified: Secondary | ICD-10-CM | POA: Diagnosis not present

## 2017-01-31 DIAGNOSIS — I4891 Unspecified atrial fibrillation: Secondary | ICD-10-CM | POA: Diagnosis not present

## 2017-01-31 DIAGNOSIS — N400 Enlarged prostate without lower urinary tract symptoms: Secondary | ICD-10-CM | POA: Diagnosis not present

## 2017-01-31 DIAGNOSIS — Z6830 Body mass index (BMI) 30.0-30.9, adult: Secondary | ICD-10-CM | POA: Diagnosis not present

## 2017-01-31 DIAGNOSIS — E1165 Type 2 diabetes mellitus with hyperglycemia: Secondary | ICD-10-CM | POA: Diagnosis not present

## 2017-03-03 ENCOUNTER — Telehealth: Payer: Self-pay | Admitting: Cardiology

## 2017-03-03 NOTE — Telephone Encounter (Signed)
Numerous attempts to contact patient with recall letters. Unable to reach by telephone. with no success.   02/16/2016 4:12 PM New [10]    [System] 05/09/2016 11:01 PM Notification Sent [20]   Chanda Busing [[phone]] 10/22/2016 9:40 AM Notification Sent [20]   Chanda Busing [[id number]] 12/31/2016 11:37 AM Notification Sent [20]   Weston Anna [[id number]] 01/20/2017 12:18 PM Notification Sent [20]   Chanda Busing [[id number]] 03/03/2017 2:59 PM Notification Sent [20]

## 2017-03-30 DIAGNOSIS — I4891 Unspecified atrial fibrillation: Secondary | ICD-10-CM | POA: Diagnosis not present

## 2017-03-30 DIAGNOSIS — E119 Type 2 diabetes mellitus without complications: Secondary | ICD-10-CM | POA: Diagnosis not present

## 2017-03-30 DIAGNOSIS — I10 Essential (primary) hypertension: Secondary | ICD-10-CM | POA: Diagnosis not present

## 2017-05-03 DIAGNOSIS — Z0000 Encounter for general adult medical examination without abnormal findings: Secondary | ICD-10-CM | POA: Diagnosis not present

## 2017-05-03 DIAGNOSIS — I4891 Unspecified atrial fibrillation: Secondary | ICD-10-CM | POA: Diagnosis not present

## 2017-05-03 DIAGNOSIS — F419 Anxiety disorder, unspecified: Secondary | ICD-10-CM | POA: Diagnosis not present

## 2017-05-03 DIAGNOSIS — E1165 Type 2 diabetes mellitus with hyperglycemia: Secondary | ICD-10-CM | POA: Diagnosis not present

## 2017-05-03 DIAGNOSIS — Z299 Encounter for prophylactic measures, unspecified: Secondary | ICD-10-CM | POA: Diagnosis not present

## 2017-05-03 DIAGNOSIS — Z1211 Encounter for screening for malignant neoplasm of colon: Secondary | ICD-10-CM | POA: Diagnosis not present

## 2017-05-03 DIAGNOSIS — Z6829 Body mass index (BMI) 29.0-29.9, adult: Secondary | ICD-10-CM | POA: Diagnosis not present

## 2017-05-03 DIAGNOSIS — Z1389 Encounter for screening for other disorder: Secondary | ICD-10-CM | POA: Diagnosis not present

## 2017-05-03 DIAGNOSIS — I10 Essential (primary) hypertension: Secondary | ICD-10-CM | POA: Diagnosis not present

## 2017-05-03 DIAGNOSIS — N400 Enlarged prostate without lower urinary tract symptoms: Secondary | ICD-10-CM | POA: Diagnosis not present

## 2017-05-03 DIAGNOSIS — Z7189 Other specified counseling: Secondary | ICD-10-CM | POA: Diagnosis not present

## 2017-05-03 DIAGNOSIS — R5383 Other fatigue: Secondary | ICD-10-CM | POA: Diagnosis not present

## 2017-05-04 DIAGNOSIS — F419 Anxiety disorder, unspecified: Secondary | ICD-10-CM | POA: Diagnosis not present

## 2017-05-04 DIAGNOSIS — Z79899 Other long term (current) drug therapy: Secondary | ICD-10-CM | POA: Diagnosis not present

## 2017-05-04 DIAGNOSIS — Z125 Encounter for screening for malignant neoplasm of prostate: Secondary | ICD-10-CM | POA: Diagnosis not present

## 2017-05-04 DIAGNOSIS — R5383 Other fatigue: Secondary | ICD-10-CM | POA: Diagnosis not present

## 2017-05-04 DIAGNOSIS — I10 Essential (primary) hypertension: Secondary | ICD-10-CM | POA: Diagnosis not present

## 2017-06-02 DIAGNOSIS — Z299 Encounter for prophylactic measures, unspecified: Secondary | ICD-10-CM | POA: Diagnosis not present

## 2017-06-02 DIAGNOSIS — E1165 Type 2 diabetes mellitus with hyperglycemia: Secondary | ICD-10-CM | POA: Diagnosis not present

## 2017-06-02 DIAGNOSIS — Z6829 Body mass index (BMI) 29.0-29.9, adult: Secondary | ICD-10-CM | POA: Diagnosis not present

## 2017-06-02 DIAGNOSIS — I4891 Unspecified atrial fibrillation: Secondary | ICD-10-CM | POA: Diagnosis not present

## 2017-06-02 DIAGNOSIS — I10 Essential (primary) hypertension: Secondary | ICD-10-CM | POA: Diagnosis not present

## 2017-06-02 DIAGNOSIS — R252 Cramp and spasm: Secondary | ICD-10-CM | POA: Diagnosis not present

## 2017-06-27 DIAGNOSIS — Z789 Other specified health status: Secondary | ICD-10-CM | POA: Diagnosis not present

## 2017-06-27 DIAGNOSIS — Z299 Encounter for prophylactic measures, unspecified: Secondary | ICD-10-CM | POA: Diagnosis not present

## 2017-06-27 DIAGNOSIS — J40 Bronchitis, not specified as acute or chronic: Secondary | ICD-10-CM | POA: Diagnosis not present

## 2017-06-27 DIAGNOSIS — Z6829 Body mass index (BMI) 29.0-29.9, adult: Secondary | ICD-10-CM | POA: Diagnosis not present

## 2017-06-27 DIAGNOSIS — I4891 Unspecified atrial fibrillation: Secondary | ICD-10-CM | POA: Diagnosis not present

## 2017-06-27 DIAGNOSIS — I2699 Other pulmonary embolism without acute cor pulmonale: Secondary | ICD-10-CM | POA: Diagnosis not present

## 2017-06-27 DIAGNOSIS — E1165 Type 2 diabetes mellitus with hyperglycemia: Secondary | ICD-10-CM | POA: Diagnosis not present

## 2017-07-01 DIAGNOSIS — R05 Cough: Secondary | ICD-10-CM | POA: Diagnosis not present

## 2017-07-27 DIAGNOSIS — Z23 Encounter for immunization: Secondary | ICD-10-CM | POA: Diagnosis not present

## 2017-10-13 DIAGNOSIS — I2699 Other pulmonary embolism without acute cor pulmonale: Secondary | ICD-10-CM | POA: Diagnosis not present

## 2017-10-13 DIAGNOSIS — R109 Unspecified abdominal pain: Secondary | ICD-10-CM | POA: Diagnosis not present

## 2017-10-13 DIAGNOSIS — G459 Transient cerebral ischemic attack, unspecified: Secondary | ICD-10-CM | POA: Diagnosis not present

## 2017-10-13 DIAGNOSIS — E1165 Type 2 diabetes mellitus with hyperglycemia: Secondary | ICD-10-CM | POA: Diagnosis not present

## 2017-10-13 DIAGNOSIS — I4891 Unspecified atrial fibrillation: Secondary | ICD-10-CM | POA: Diagnosis not present

## 2017-10-13 DIAGNOSIS — Z87891 Personal history of nicotine dependence: Secondary | ICD-10-CM | POA: Diagnosis not present

## 2017-10-13 DIAGNOSIS — D649 Anemia, unspecified: Secondary | ICD-10-CM | POA: Diagnosis not present

## 2017-10-13 DIAGNOSIS — R5383 Other fatigue: Secondary | ICD-10-CM | POA: Diagnosis not present

## 2017-10-13 DIAGNOSIS — Z299 Encounter for prophylactic measures, unspecified: Secondary | ICD-10-CM | POA: Diagnosis not present

## 2017-10-13 DIAGNOSIS — Z6829 Body mass index (BMI) 29.0-29.9, adult: Secondary | ICD-10-CM | POA: Diagnosis not present

## 2017-10-17 DIAGNOSIS — K9409 Other complications of colostomy: Secondary | ICD-10-CM | POA: Diagnosis not present

## 2017-10-17 DIAGNOSIS — R1031 Right lower quadrant pain: Secondary | ICD-10-CM | POA: Diagnosis not present

## 2017-10-17 DIAGNOSIS — Z85038 Personal history of other malignant neoplasm of large intestine: Secondary | ICD-10-CM | POA: Diagnosis not present

## 2017-10-17 DIAGNOSIS — K449 Diaphragmatic hernia without obstruction or gangrene: Secondary | ICD-10-CM | POA: Diagnosis not present

## 2017-12-21 DIAGNOSIS — I10 Essential (primary) hypertension: Secondary | ICD-10-CM | POA: Diagnosis not present

## 2017-12-21 DIAGNOSIS — E1165 Type 2 diabetes mellitus with hyperglycemia: Secondary | ICD-10-CM | POA: Diagnosis not present

## 2017-12-21 DIAGNOSIS — Z299 Encounter for prophylactic measures, unspecified: Secondary | ICD-10-CM | POA: Diagnosis not present

## 2017-12-21 DIAGNOSIS — Z6828 Body mass index (BMI) 28.0-28.9, adult: Secondary | ICD-10-CM | POA: Diagnosis not present

## 2017-12-21 DIAGNOSIS — I2699 Other pulmonary embolism without acute cor pulmonale: Secondary | ICD-10-CM | POA: Diagnosis not present

## 2017-12-21 DIAGNOSIS — I4891 Unspecified atrial fibrillation: Secondary | ICD-10-CM | POA: Diagnosis not present

## 2018-01-31 DIAGNOSIS — C004 Malignant neoplasm of lower lip, inner aspect: Secondary | ICD-10-CM | POA: Diagnosis not present

## 2018-01-31 DIAGNOSIS — C002 Malignant neoplasm of external lip, unspecified: Secondary | ICD-10-CM | POA: Diagnosis not present

## 2018-02-08 DIAGNOSIS — C002 Malignant neoplasm of external lip, unspecified: Secondary | ICD-10-CM | POA: Diagnosis not present

## 2018-02-08 DIAGNOSIS — C004 Malignant neoplasm of lower lip, inner aspect: Secondary | ICD-10-CM | POA: Diagnosis not present

## 2018-03-29 DIAGNOSIS — E1165 Type 2 diabetes mellitus with hyperglycemia: Secondary | ICD-10-CM | POA: Diagnosis not present

## 2018-03-29 DIAGNOSIS — I4891 Unspecified atrial fibrillation: Secondary | ICD-10-CM | POA: Diagnosis not present

## 2018-03-29 DIAGNOSIS — Z299 Encounter for prophylactic measures, unspecified: Secondary | ICD-10-CM | POA: Diagnosis not present

## 2018-03-29 DIAGNOSIS — I10 Essential (primary) hypertension: Secondary | ICD-10-CM | POA: Diagnosis not present

## 2018-03-29 DIAGNOSIS — I2699 Other pulmonary embolism without acute cor pulmonale: Secondary | ICD-10-CM | POA: Diagnosis not present

## 2018-03-29 DIAGNOSIS — Z6828 Body mass index (BMI) 28.0-28.9, adult: Secondary | ICD-10-CM | POA: Diagnosis not present

## 2018-05-12 DIAGNOSIS — D649 Anemia, unspecified: Secondary | ICD-10-CM | POA: Diagnosis not present

## 2018-05-12 DIAGNOSIS — Z6827 Body mass index (BMI) 27.0-27.9, adult: Secondary | ICD-10-CM | POA: Diagnosis not present

## 2018-05-12 DIAGNOSIS — Z299 Encounter for prophylactic measures, unspecified: Secondary | ICD-10-CM | POA: Diagnosis not present

## 2018-05-12 DIAGNOSIS — Z7189 Other specified counseling: Secondary | ICD-10-CM | POA: Diagnosis not present

## 2018-05-12 DIAGNOSIS — Z125 Encounter for screening for malignant neoplasm of prostate: Secondary | ICD-10-CM | POA: Diagnosis not present

## 2018-05-12 DIAGNOSIS — Z0000 Encounter for general adult medical examination without abnormal findings: Secondary | ICD-10-CM | POA: Diagnosis not present

## 2018-05-12 DIAGNOSIS — E039 Hypothyroidism, unspecified: Secondary | ICD-10-CM | POA: Diagnosis not present

## 2018-05-12 DIAGNOSIS — I10 Essential (primary) hypertension: Secondary | ICD-10-CM | POA: Diagnosis not present

## 2018-05-12 DIAGNOSIS — Z1331 Encounter for screening for depression: Secondary | ICD-10-CM | POA: Diagnosis not present

## 2018-05-12 DIAGNOSIS — Z1339 Encounter for screening examination for other mental health and behavioral disorders: Secondary | ICD-10-CM | POA: Diagnosis not present

## 2018-05-12 DIAGNOSIS — Z79899 Other long term (current) drug therapy: Secondary | ICD-10-CM | POA: Diagnosis not present

## 2018-05-16 DIAGNOSIS — D649 Anemia, unspecified: Secondary | ICD-10-CM | POA: Diagnosis not present

## 2018-05-17 DIAGNOSIS — D649 Anemia, unspecified: Secondary | ICD-10-CM | POA: Diagnosis not present

## 2018-06-06 DIAGNOSIS — Z23 Encounter for immunization: Secondary | ICD-10-CM | POA: Diagnosis not present

## 2018-06-21 DIAGNOSIS — R05 Cough: Secondary | ICD-10-CM | POA: Diagnosis not present

## 2018-06-21 DIAGNOSIS — Z6826 Body mass index (BMI) 26.0-26.9, adult: Secondary | ICD-10-CM | POA: Diagnosis not present

## 2018-06-21 DIAGNOSIS — R0989 Other specified symptoms and signs involving the circulatory and respiratory systems: Secondary | ICD-10-CM | POA: Diagnosis not present

## 2018-06-21 DIAGNOSIS — Z299 Encounter for prophylactic measures, unspecified: Secondary | ICD-10-CM | POA: Diagnosis not present

## 2018-06-21 DIAGNOSIS — J45909 Unspecified asthma, uncomplicated: Secondary | ICD-10-CM | POA: Diagnosis not present

## 2018-06-21 DIAGNOSIS — I4891 Unspecified atrial fibrillation: Secondary | ICD-10-CM | POA: Diagnosis not present

## 2018-06-21 DIAGNOSIS — R0602 Shortness of breath: Secondary | ICD-10-CM | POA: Diagnosis not present

## 2018-06-21 DIAGNOSIS — I10 Essential (primary) hypertension: Secondary | ICD-10-CM | POA: Diagnosis not present

## 2018-07-05 DIAGNOSIS — R5383 Other fatigue: Secondary | ICD-10-CM | POA: Diagnosis not present

## 2018-07-05 DIAGNOSIS — Z6826 Body mass index (BMI) 26.0-26.9, adult: Secondary | ICD-10-CM | POA: Diagnosis not present

## 2018-07-05 DIAGNOSIS — I10 Essential (primary) hypertension: Secondary | ICD-10-CM | POA: Diagnosis not present

## 2018-07-05 DIAGNOSIS — D649 Anemia, unspecified: Secondary | ICD-10-CM | POA: Diagnosis not present

## 2018-07-05 DIAGNOSIS — Z299 Encounter for prophylactic measures, unspecified: Secondary | ICD-10-CM | POA: Diagnosis not present

## 2018-07-05 DIAGNOSIS — E1165 Type 2 diabetes mellitus with hyperglycemia: Secondary | ICD-10-CM | POA: Diagnosis not present

## 2018-07-05 DIAGNOSIS — R0600 Dyspnea, unspecified: Secondary | ICD-10-CM | POA: Diagnosis not present

## 2018-07-10 DIAGNOSIS — R0602 Shortness of breath: Secondary | ICD-10-CM | POA: Diagnosis not present

## 2018-07-17 DIAGNOSIS — R0600 Dyspnea, unspecified: Secondary | ICD-10-CM | POA: Diagnosis not present

## 2018-07-17 DIAGNOSIS — D649 Anemia, unspecified: Secondary | ICD-10-CM | POA: Diagnosis not present

## 2018-07-17 DIAGNOSIS — I4891 Unspecified atrial fibrillation: Secondary | ICD-10-CM | POA: Diagnosis not present

## 2018-07-17 DIAGNOSIS — Z299 Encounter for prophylactic measures, unspecified: Secondary | ICD-10-CM | POA: Diagnosis not present

## 2018-07-17 DIAGNOSIS — I10 Essential (primary) hypertension: Secondary | ICD-10-CM | POA: Diagnosis not present

## 2018-07-17 DIAGNOSIS — E1165 Type 2 diabetes mellitus with hyperglycemia: Secondary | ICD-10-CM | POA: Diagnosis not present

## 2018-07-17 DIAGNOSIS — Z6826 Body mass index (BMI) 26.0-26.9, adult: Secondary | ICD-10-CM | POA: Diagnosis not present

## 2018-08-01 ENCOUNTER — Encounter: Payer: Self-pay | Admitting: *Deleted

## 2018-08-03 ENCOUNTER — Encounter: Payer: Self-pay | Admitting: *Deleted

## 2018-08-03 ENCOUNTER — Encounter: Payer: Self-pay | Admitting: Cardiology

## 2018-08-03 ENCOUNTER — Ambulatory Visit (INDEPENDENT_AMBULATORY_CARE_PROVIDER_SITE_OTHER): Payer: Medicare Other | Admitting: Cardiology

## 2018-08-03 VITALS — BP 140/79 | HR 100 | Ht 72.0 in | Wt 197.0 lb

## 2018-08-03 DIAGNOSIS — R0602 Shortness of breath: Secondary | ICD-10-CM

## 2018-08-03 NOTE — Progress Notes (Signed)
Clinical Summary Mr. Osment is a 77 y.o.male seen today for follow up of the following medical problems.   1. History of PE/SOB - history of large bilateral PE with RV strain by CT during admit at [facility] 11/2015 - echo 11/2015 report indicated normal RV function, no LVEF reported. PASP 45. Fairly limited details in echo report.  - recent abdominal surgery 10/2015 at [facility] for peforated bowel, suspected trigger for PE - anticoag stopped recently due to severe anemia, with recent admission 01/2016 at [facility]. Hgb in the 6s, received 4 total units of pRBCs. He reports negative GI workup. Has been off anticoag since, Hgb followed as outpatient by pcp   2. Aflutter/Afib - new diagnosis 12/2015 - started on lopressor, he had already been on anticoagualation due to recent PE.  - stopped metoprolol due to SOB and weakness. Took for about 1 week. Did not fill Rx for diltiazem. He thinks now that his fatigue was probably more related to his severe anemia at the time.  - eliquis $300, xarelto was same price. Currently has samples at home.  - reports occasional palpitaitons.   - no recent palpitations. Has not been on anticoag due to ongoing issues with anemia. Has required trasnfusions twice within the last year.   3. Anemia - reports Hgb in 6s recently, tranfused blood during recent admission.  - he has stopped takling his eliquis. - has not restarted eliquis since discharge.  - H&H followed as outpatient by pcp  - recent labs by pcp. Ongoing problem. Last labs about 1 month ago   4. SOB - started 3-4 months ago. Gradual onset. Mainyl with exertion. Notices walking steps at home. No chest pain. Can have some leg weakness - +cough few months, no productive. Former smoker off and on 20 years.  - no recent edema, no chest pain. - he reports a recent echo by pcp that was abnormal but he was unsure of results.    Past Medical History:  Diagnosis Date  . BPH (benign  prostatic hypertrophy)   . Diabetes mellitus ([place])   . Hypothyroidism   . 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  . PE (pulmonary embolism)    on eliquis 11-2015     No Active Allergies   Current Outpatient Medications  Medication Sig Dispense Refill  . ALPRAZolam (XANAX) 0.5 MG tablet Take 0.5 mg by mouth at bedtime as needed for anxiety.    Marland Kitchen apixaban (ELIQUIS) 5 MG TABS tablet Take 5 mg by mouth 2 (two) times daily.    Marland Kitchen aspirin 81 MG tablet Take 81 mg by mouth daily.    Marland Kitchen glipiZIDE (GLUCOTROL) 5 MG tablet Take 10 mg by mouth daily before breakfast. & 5 mg in the evening    . levothyroxine (SYNTHROID, LEVOTHROID) 50 MCG tablet Take 50 mcg by mouth daily before breakfast.    . lisinopril (PRINIVIL,ZESTRIL) 10 MG tablet Take 10 mg by mouth daily.    . metoprolol tartrate (LOPRESSOR) 25 MG tablet Take 0.5 tablets (12.5 mg total) by mouth 2 (two) times daily. 90 tablet 3  . tamsulosin (FLOMAX) 0.4 MG CAPS capsule Take 0.4 mg by mouth daily.     No current facility-administered medications for this visit.         No Active Allergies    Family History  Problem Relation Age of Onset  . Heart disease Father   . Heart failure Father   .  Heart attack Father      Social History Mr. Berberian reports that he quit smoking about 27 years ago. His smoking use included cigarettes. He started smoking about 54 years ago. He has a 27.00 pack-year smoking history. He quit smokeless tobacco use about 22 years ago.  His smokeless tobacco use included chew. Mr. Graumann has no history on file for alcohol.   Review of Systems CONSTITUTIONAL: No weight loss, fever, chills, weakness or fatigue.  HEENT: Eyes: No visual loss, blurred vision, double vision or yellow sclerae.No hearing loss, sneezing, congestion, runny nose or sore throat.  SKIN: No rash or itching.  CARDIOVASCULAR: per hpi RESPIRATORY: per hpi GASTROINTESTINAL: No anorexia, nausea, vomiting or  diarrhea. No abdominal pain or blood.  GENITOURINARY: No burning on urination, no polyuria NEUROLOGICAL: No headache, dizziness, syncope, paralysis, ataxia, numbness or tingling in the extremities. No change in bowel or bladder control.  MUSCULOSKELETAL: No muscle, back pain, joint pain or stiffness.  LYMPHATICS: No enlarged nodes. No history of splenectomy.  PSYCHIATRIC: No history of depression or anxiety.  ENDOCRINOLOGIC: No reports of sweating, cold or heat intolerance. No polyuria or polydipsia.  Marland Kitchen   Physical Examination Vitals:   08/03/18 0814  BP: 140/79  Pulse: 100  SpO2: 96%   Vitals:   08/03/18 0814  Weight: 197 lb (89.4 kg)  Height: 6' (1.829 m)    Gen: resting comfortably, no acute distress HEENT: no scleral icterus, pupils equal round and reactive, no palptable cervical adenopathy,  CV: irreg, no m/r/g, no jvd Resp: Clear to auscultation bilaterally GI: abdomen is soft, non-tender, non-distended, normal bowel sounds, no hepatosplenomegaly MSK: extremities are warm, no edema.  Skin: warm, no rash Neuro:  no focal deficits Psych: appropriate affect   Diagnostic Studies 12/2015 echo Study Conclusions  - Left ventricle: The cavity size was normal. Wall thickness was  increased in a pattern of mild LVH. Systolic function was normal.  The estimated ejection fraction was in the range of 55% to 60%.  Unable to assess diastolic function, patient is in aflutter. Wall  motion was normal; there were no regional wall motion  abnormalities. - Aortic valve: Mildly calcified annulus. Trileaflet; mildly  thickened leaflets. Valve area (VTI): 2.74 cm^2. Valve area  (Vmax): 3.04 cm^2. Valve area (Vmean): 2.25 cm^2. - Mitral valve: Mildly calcified annulus. Mildly thickened leaflets  . - Left atrium: The atrium was moderately dilated. - Right ventricle: The cavity size was mildly dilated. - Right atrium: Right atrial mass on the superior wall of similar   echogenicity as surrounding myocardium, likely prominent crista  terminalis. - Atrial septum: There was increased thickness of the septum,  consistent with lipomatous hypertrophy. No defect or patent  foramen ovale was identified. - Pulmonary arteries: Systolic pressure was moderately increased.  PA peak pressure: 42 mm Hg (S). - Technically adequate study.     Assessment and Plan   1. Paroxysmal aflutter/Afib - previously noted to have aflutter, today EKG shows afib - no symptoms. He reports metoprolol causes depression and suicidial ideations and will not retry. He did not fill the dilt we gave him last visit - has not been on anticoag due to ongoing issues with anemia  2. SOB - unclear etiology. We will request echo report  From pcp. Does not appear volume overloaded by exam, no significant murmurs on exam - ongoing cough few months, former smoker. Consider PFTs - ongoing issues with anemia, f/u labs from pcp -no chest pain,  would check PFTs first  before considering ischemic testing unless markedly abnormal echocardiogram.     Echo obtained after echo. LVEF 70-75%, no WMAs, normal RV, no significant valve pathology - will obtain PFTs given SOB, cough, smoking history. If negative reconsider possible ischemic testing at that time.    Arnoldo Lenis, M.D.

## 2018-08-03 NOTE — Patient Instructions (Addendum)
Medication Instructions:  Continue all current medications.  Labwork: none  Testing/Procedures: none  Follow-Up: Pending   Any Other Special Instructions Will Be Listed Below (If Applicable).   If you need a refill on your cardiac medications before your next appointment, please call your pharmacy.  

## 2018-08-04 ENCOUNTER — Telehealth: Payer: Self-pay | Admitting: *Deleted

## 2018-08-04 NOTE — Telephone Encounter (Signed)
[  08/04/2018 4:13 PM]  Carlyle Dolly:   can we call Jose Lawrence from [facility] and let him know the echo from his pcp was normal. I would not start with a stress or cath but would recommend getting PFTs at [facility] for his SOB

## 2018-08-04 NOTE — Telephone Encounter (Signed)
Patient notified and verbalized understanding.   Stated that he will be seeing Dr. Woody Seller on 08/07/18 & would like to discuss with him first.   He will call back when ready to schedule.

## 2018-08-07 DIAGNOSIS — Z299 Encounter for prophylactic measures, unspecified: Secondary | ICD-10-CM | POA: Diagnosis not present

## 2018-08-07 DIAGNOSIS — I10 Essential (primary) hypertension: Secondary | ICD-10-CM | POA: Diagnosis not present

## 2018-08-07 DIAGNOSIS — E1165 Type 2 diabetes mellitus with hyperglycemia: Secondary | ICD-10-CM | POA: Diagnosis not present

## 2018-08-07 DIAGNOSIS — Z6826 Body mass index (BMI) 26.0-26.9, adult: Secondary | ICD-10-CM | POA: Diagnosis not present

## 2018-08-07 DIAGNOSIS — R42 Dizziness and giddiness: Secondary | ICD-10-CM | POA: Diagnosis not present

## 2018-08-07 DIAGNOSIS — D649 Anemia, unspecified: Secondary | ICD-10-CM | POA: Diagnosis not present

## 2018-08-08 DIAGNOSIS — D649 Anemia, unspecified: Secondary | ICD-10-CM | POA: Diagnosis not present

## 2018-08-08 DIAGNOSIS — H81393 Other peripheral vertigo, bilateral: Secondary | ICD-10-CM | POA: Diagnosis not present

## 2018-08-08 DIAGNOSIS — E1140 Type 2 diabetes mellitus with diabetic neuropathy, unspecified: Secondary | ICD-10-CM | POA: Diagnosis not present

## 2018-08-08 DIAGNOSIS — E1159 Type 2 diabetes mellitus with other circulatory complications: Secondary | ICD-10-CM | POA: Diagnosis not present

## 2018-08-11 DIAGNOSIS — D649 Anemia, unspecified: Secondary | ICD-10-CM | POA: Diagnosis not present

## 2018-08-11 DIAGNOSIS — R531 Weakness: Secondary | ICD-10-CM | POA: Diagnosis not present

## 2018-08-17 DIAGNOSIS — I4891 Unspecified atrial fibrillation: Secondary | ICD-10-CM | POA: Diagnosis not present

## 2018-08-17 DIAGNOSIS — Z299 Encounter for prophylactic measures, unspecified: Secondary | ICD-10-CM | POA: Diagnosis not present

## 2018-08-17 DIAGNOSIS — Z6826 Body mass index (BMI) 26.0-26.9, adult: Secondary | ICD-10-CM | POA: Diagnosis not present

## 2018-08-17 DIAGNOSIS — I10 Essential (primary) hypertension: Secondary | ICD-10-CM | POA: Diagnosis not present

## 2018-08-17 DIAGNOSIS — K921 Melena: Secondary | ICD-10-CM | POA: Diagnosis not present

## 2018-08-17 DIAGNOSIS — K219 Gastro-esophageal reflux disease without esophagitis: Secondary | ICD-10-CM | POA: Diagnosis not present

## 2018-08-21 ENCOUNTER — Encounter (HOSPITAL_COMMUNITY): Payer: Self-pay | Admitting: Emergency Medicine

## 2018-08-21 ENCOUNTER — Emergency Department (HOSPITAL_COMMUNITY): Payer: Medicare Other

## 2018-08-21 ENCOUNTER — Encounter (INDEPENDENT_AMBULATORY_CARE_PROVIDER_SITE_OTHER): Payer: Self-pay | Admitting: *Deleted

## 2018-08-21 ENCOUNTER — Emergency Department (HOSPITAL_COMMUNITY)
Admission: EM | Admit: 2018-08-21 | Discharge: 2018-08-21 | Disposition: A | Discharge status: Home / Self Care | Payer: Medicare Other | Attending: Emergency Medicine | Admitting: Emergency Medicine

## 2018-08-21 ENCOUNTER — Other Ambulatory Visit (INDEPENDENT_AMBULATORY_CARE_PROVIDER_SITE_OTHER): Payer: Self-pay | Admitting: *Deleted

## 2018-08-21 DIAGNOSIS — Z87891 Personal history of nicotine dependence: Secondary | ICD-10-CM | POA: Insufficient documentation

## 2018-08-21 DIAGNOSIS — R531 Weakness: Secondary | ICD-10-CM | POA: Diagnosis not present

## 2018-08-21 DIAGNOSIS — D649 Anemia, unspecified: Secondary | ICD-10-CM | POA: Diagnosis not present

## 2018-08-21 DIAGNOSIS — R1030 Lower abdominal pain, unspecified: Secondary | ICD-10-CM | POA: Diagnosis not present

## 2018-08-21 DIAGNOSIS — Z7982 Long term (current) use of aspirin: Secondary | ICD-10-CM | POA: Diagnosis not present

## 2018-08-21 DIAGNOSIS — Z79899 Other long term (current) drug therapy: Secondary | ICD-10-CM | POA: Insufficient documentation

## 2018-08-21 DIAGNOSIS — E039 Hypothyroidism, unspecified: Secondary | ICD-10-CM | POA: Diagnosis not present

## 2018-08-21 DIAGNOSIS — Z7984 Long term (current) use of oral hypoglycemic drugs: Secondary | ICD-10-CM | POA: Insufficient documentation

## 2018-08-21 DIAGNOSIS — R1032 Left lower quadrant pain: Secondary | ICD-10-CM | POA: Diagnosis not present

## 2018-08-21 DIAGNOSIS — E119 Type 2 diabetes mellitus without complications: Secondary | ICD-10-CM | POA: Insufficient documentation

## 2018-08-21 DIAGNOSIS — K449 Diaphragmatic hernia without obstruction or gangrene: Secondary | ICD-10-CM | POA: Diagnosis not present

## 2018-08-21 DIAGNOSIS — K6389 Other specified diseases of intestine: Secondary | ICD-10-CM | POA: Diagnosis present

## 2018-08-21 LAB — CBC WITH DIFFERENTIAL/PLATELET
Abs Immature Granulocytes: 0.02 10*3/uL (ref 0.00–0.07)
BASOS PCT: 1 %
Basophils Absolute: 0.1 10*3/uL (ref 0.0–0.1)
Eosinophils Absolute: 0.2 10*3/uL (ref 0.0–0.5)
Eosinophils Relative: 3 %
HCT: 33.2 % — ABNORMAL LOW (ref 39.0–52.0)
Hemoglobin: 9.7 g/dL — ABNORMAL LOW (ref 13.0–17.0)
Immature Granulocytes: 0 %
Lymphocytes Relative: 18 %
Lymphs Abs: 1.4 10*3/uL (ref 0.7–4.0)
MCH: 25.8 pg — ABNORMAL LOW (ref 26.0–34.0)
MCHC: 29.2 g/dL — AB (ref 30.0–36.0)
MCV: 88.3 fL (ref 80.0–100.0)
Monocytes Absolute: 0.8 10*3/uL (ref 0.1–1.0)
Monocytes Relative: 10 %
Neutro Abs: 5.3 10*3/uL (ref 1.7–7.7)
Neutrophils Relative %: 68 %
Platelets: 237 10*3/uL (ref 150–400)
RBC: 3.76 MIL/uL — ABNORMAL LOW (ref 4.22–5.81)
RDW: 14.1 % (ref 11.5–15.5)
WBC: 7.7 10*3/uL (ref 4.0–10.5)
nRBC: 0 % (ref 0.0–0.2)

## 2018-08-21 LAB — COMPREHENSIVE METABOLIC PANEL
ALT: 15 U/L (ref 0–44)
AST: 22 U/L (ref 15–41)
Albumin: 3.2 g/dL — ABNORMAL LOW (ref 3.5–5.0)
Alkaline Phosphatase: 69 U/L (ref 38–126)
Anion gap: 7 (ref 5–15)
BUN: 12 mg/dL (ref 8–23)
CO2: 24 mmol/L (ref 22–32)
Calcium: 8.8 mg/dL — ABNORMAL LOW (ref 8.9–10.3)
Chloride: 105 mmol/L (ref 98–111)
Creatinine, Ser: 0.8 mg/dL (ref 0.61–1.24)
GFR calc Af Amer: >60 mL/min (ref >60)
Glucose, Bld: 147 mg/dL — ABNORMAL HIGH (ref 70–99)
Potassium: 3.9 mmol/L (ref 3.5–5.1)
Sodium: 136 mmol/L (ref 135–145)
Total Bilirubin: 0.5 mg/dL (ref 0.3–1.2)
Total Protein: 6.8 g/dL (ref 6.5–8.1)

## 2018-08-21 LAB — TYPE AND SCREEN
ABO/RH(D): A POS
ANTIBODY SCREEN: NEGATIVE

## 2018-08-21 LAB — LIPASE, BLOOD: Lipase: 23 U/L (ref 11–51)

## 2018-08-21 LAB — POC OCCULT BLOOD, ED: Fecal Occult Bld: POSITIVE — AB

## 2018-08-21 MED ORDER — SODIUM CHLORIDE 0.9 % IV BOLUS
1000.0000 mL | Freq: Once | INTRAVENOUS | Status: AC
  Administered 2018-08-21: 1000 mL via INTRAVENOUS

## 2018-08-21 MED ORDER — IOHEXOL 300 MG/ML  SOLN
100.0000 mL | Freq: Once | INTRAMUSCULAR | Status: AC | PRN
  Administered 2018-08-21: 100 mL via INTRAVENOUS

## 2018-08-21 NOTE — Discharge Instructions (Addendum)
Someone from Dr. Olevia Perches office will contact you this week to arrange for a follow-up appt in his office and schedule you to have a colonoscopy.  You may continue taking your 81 mg aspirin.  Return here for any worsening symptoms

## 2018-08-21 NOTE — ED Provider Notes (Signed)
[facility] EMERGENCY DEPARTMENT Provider Note   CSN: [id number] Arrival date & time: 08/21/18  0759     History   Chief Complaint Chief Complaint  Patient presents with  . Melena    HPI Jose Lawrence is a 78 y.o. male.  HPI  Jose Lawrence is a 78 y.o. male with history of colon cancer with colostomy, diabetes mellitus, PE, and atrial fibrillation presents to the Emergency Department complaining of left lower abdominal pain and blood in his colostomy bag for 4 days.  He states that his abdomen feels "sore" around his stoma and appears to be swollen.  He describes the blood in his bag as bright red in color.  He denies fever or vomiting.  No decreased appetite.  Pain to his abdomen is constant and not affected by food intake.  He also complains of generalized weakness and fatigue since his current symptoms began.  No dizziness or headache, no visual changes.  he has a history of anemia of unclear etiology.  He had a blood transfusion 2 weeks ago (2 units) at [facility] with f/u Hgb of 10.5.  He takes an 81 mg aspirin daily for his A. fib, but no other anticoagulants.  He denies fever, chills, weight loss and dysuria.  No shortness of breath or chest pain.    Past Medical History:  Diagnosis Date  . A-fib ([place])   . BPH (benign prostatic hypertrophy)   . Cancer ([place])    colon  . Diabetes mellitus ([place])   . Hypothyroidism   . 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  . PE (pulmonary embolism)    on eliquis 11-2015    There are no active problems to display for this patient.   Past Surgical History:  Procedure Laterality Date  . CHOLECYSTECTOMY    . COLOSTOMY    . SBO SURGERY  06/2009      Home Medications    Prior to Admission medications   Medication Sig Start Date End Date Taking? Authorizing Provider  aspirin 81 MG tablet Take 81 mg by mouth daily.    [provider]  glipiZIDE (GLUCOTROL) 5 MG tablet Take 10 mg by  mouth daily before breakfast. & 5 mg in the evening    [provider]  levothyroxine (SYNTHROID, LEVOTHROID) 50 MCG tablet Take 50 mcg by mouth daily before breakfast.    [provider]  lisinopril (PRINIVIL,ZESTRIL) 10 MG tablet Take 10 mg by mouth daily.    [provider]  tamsulosin (FLOMAX) 0.4 MG CAPS capsule Take 0.4 mg by mouth daily.    [provider]    Family History Family History  Problem Relation Age of Onset  . Heart disease Father   . Heart failure Father   . Heart attack Father     Social History Social History   Tobacco Use  . Smoking status: Former Smoker    Packs/day: 1.00    Years: 27.00    Pack years: 27.00    Types: Cigarettes    Start date: 01/27/1964    Last attempt to quit: 01/27/1991    Years since quitting: 27.5  . Smokeless tobacco: Former Systems developer    Types: Chew    Quit date: 08/10/1995  Substance Use Topics  . Alcohol use: Never    Alcohol/week: 0.0 standard drinks    Frequency: Never  . Drug use: Never     Allergies   Patient has no  known allergies.   Review of Systems Review of Systems  Constitutional: Positive for fatigue. Negative for activity change, appetite change, fever and unexpected weight change.  HENT: Negative for trouble swallowing.   Eyes: Negative for visual disturbance.  Respiratory: Negative for cough, chest tightness and shortness of breath.   Cardiovascular: Negative for chest pain and leg swelling.  Gastrointestinal: Positive for abdominal pain and blood in stool. Negative for nausea and vomiting.  Genitourinary: Negative for dysuria.  Musculoskeletal: Negative for arthralgias, back pain and myalgias.  Skin: Negative for rash.  Neurological: Positive for weakness (Generalized weakness). Negative for dizziness, syncope, light-headedness, numbness and headaches.  Psychiatric/Behavioral: Negative for confusion. The patient is not nervous/anxious.      Physical Exam Updated Vital  Signs BP 139/87 (BP Location: Left Arm)   Pulse 88   Temp 97.9 F (36.6 C) (Oral)   Resp 18   Ht 6' (1.829 m)   Wt 87.1 kg   SpO2 99%   BMI 26.04 kg/m   Physical Exam Vitals signs and nursing note reviewed.  Constitutional:      General: He is not in acute distress.    Appearance: Normal appearance. He is not ill-appearing or toxic-appearing.  HENT:     Head: Atraumatic.     Mouth/Throat:     Mouth: Mucous membranes are moist.     Pharynx: Oropharynx is clear. No posterior oropharyngeal erythema.  Eyes:     Conjunctiva/sclera: Conjunctivae normal.  Neck:     Musculoskeletal: Normal range of motion and neck supple.  Cardiovascular:     Rate and Rhythm: Normal rate and regular rhythm.     Pulses: Normal pulses.  Pulmonary:     Effort: Pulmonary effort is normal. No respiratory distress.     Breath sounds: Normal breath sounds. No wheezing or rales.  Abdominal:     Palpations: Abdomen is soft.     Tenderness: There is abdominal tenderness. There is no guarding.     Comments: Mild left lower abdominal tenderness and swelling around the stoma site.  Dark brown, watery stool present in the colostomy bag.  Musculoskeletal: Normal range of motion.     Right lower leg: No edema.     Left lower leg: No edema.  Skin:    General: Skin is warm.     Findings: No rash.  Neurological:     General: No focal deficit present.     Mental Status: He is alert.     Motor: No weakness.      ED Treatments / Results  Labs (all labs ordered are listed, but only abnormal results are displayed) Labs Reviewed  CBC WITH DIFFERENTIAL/PLATELET - Abnormal; Notable for the following components:      Result Value   RBC 3.76 (*)    Hemoglobin 9.7 (*)    HCT 33.2 (*)    MCH 25.8 (*)    MCHC 29.2 (*)    All other components within normal limits  COMPREHENSIVE METABOLIC PANEL - Abnormal; Notable for the following components:   Glucose, Bld 147 (*)    Calcium 8.8 (*)    Albumin 3.2 (*)     All other components within normal limits  POC OCCULT BLOOD, ED - Abnormal; Notable for the following components:   Fecal Occult Bld POSITIVE (*)    All other components within normal limits  LIPASE, BLOOD  TYPE AND SCREEN    EKG None  Radiology Ct Abdomen Pelvis W Contrast  Result Date: 08/21/2018 CLINICAL  DATA:  Blood in his colostomy bag. Intermittent abdominal pain. History of colon cancer. EXAM: CT ABDOMEN AND PELVIS WITH CONTRAST TECHNIQUE: Multidetector CT imaging of the abdomen and pelvis was performed using the standard protocol following bolus administration of intravenous contrast. CONTRAST:  174mL OMNIPAQUE IOHEXOL 300 MG/ML  SOLN COMPARISON:  CT abdomen pelvis dated October 17, 2017. FINDINGS: Lower chest: No acute abnormality. Hepatobiliary: No focal liver abnormality is seen. Status post cholecystectomy. No biliary dilatation. Pancreas: Unremarkable. No pancreatic ductal dilatation or surrounding inflammatory changes. Spleen: Normal in size without focal abnormality. Adrenals/Urinary Tract: The adrenal glands are unremarkable. Subcentimeter low-density lesions in the peripheral right kidney remain too small to characterize. No renal calculi or hydronephrosis. The bladder is unremarkable. Stomach/Bowel: Unchanged small hiatal hernia. The stomach is otherwise within normal limits. Unchanged left lower quadrant colostomy with small parasternal hernia containing nondilated small bowel. Focal, irregular, nodular wall thickening of the mid transverse colon. Normal appendix. Vascular/Lymphatic: Aortic atherosclerosis. No enlarged abdominal or pelvic lymph nodes. Reproductive: Unchanged prostatomegaly. Other: Stable presacral soft tissue thickening consistent with post treatment changes. No free fluid or pneumoperitoneum. Musculoskeletal: No acute or significant osseous findings. IMPRESSION: 1. Persistent focal, irregular, nodular wall thickening of the mid transverse colon, concerning for  malignancy. Colonoscopy is recommended for further evaluation. 2. Unchanged left lower quadrant colostomy with small parastomal hernia containing non-dilated small bowel. 3. Stable presacral post treatment changes. Electronically Signed   By: Titus Dubin M.D.   On: 08/21/2018 10:29    Procedures Procedures (including critical care time)  Medications Ordered in ED Medications  sodium chloride 0.9 % bolus 1,000 mL (0 mLs Intravenous Stopped 08/21/18 1002)  iohexol (OMNIPAQUE) 300 MG/ML solution 100 mL (100 mLs Intravenous Contrast Given 08/21/18 1000)     Initial Impression / Assessment and Plan / ED Course  I have reviewed the triage vital signs and the nursing notes.  Pertinent labs & imaging results that were available during my care of the patient were reviewed by me and considered in my medical decision making (see chart for details).    Patient resting comfortably, appears hemodynamically stable.  Follow-up hemoglobin 1 week ago of 10.5 per patient after receiving blood transfusion,  hemoglobin today is 9.7  Patient is requesting referral to GI.  I will consult Dr. Laural Golden.  1300 consulted Dr. Laural Golden and discussed findings, he will follow-up patient in his office and arrange for colonoscopy this week.  Patient agrees to plan, Dr. Laural Golden asked that patient stop by his office after discharge from ER, to pick up instructions for colonoscopy. Pt verbalized understanding  Final Clinical Impressions(s) / ED Diagnoses   Final diagnoses:  Anemia, unspecified type  Lower abdominal pain    ED Discharge Orders    None       Kem Parkinson, PA-C 08/21/18 1641    Milton Ferguson, MD 08/23/18 1134

## 2018-08-21 NOTE — ED Triage Notes (Signed)
Pt states he has had blood in his colostomy bag since Thursday.  Abd is painful to touch, but not constant and has been very tired and weak since this began.  Had blood 2 weeks ago (2 units).

## 2018-08-23 ENCOUNTER — Encounter (HOSPITAL_COMMUNITY)
Admission: RE | Admit: 2018-08-23 | Discharge: 2018-08-23 | Disposition: A | Discharge status: Home / Self Care | Payer: Medicare Other | Source: Ambulatory Visit | Attending: Internal Medicine | Admitting: Internal Medicine

## 2018-08-23 ENCOUNTER — Encounter (HOSPITAL_COMMUNITY): Payer: Self-pay

## 2018-08-23 DIAGNOSIS — Z299 Encounter for prophylactic measures, unspecified: Secondary | ICD-10-CM | POA: Diagnosis not present

## 2018-08-23 DIAGNOSIS — I10 Essential (primary) hypertension: Secondary | ICD-10-CM | POA: Diagnosis not present

## 2018-08-23 DIAGNOSIS — I2699 Other pulmonary embolism without acute cor pulmonale: Secondary | ICD-10-CM | POA: Diagnosis not present

## 2018-08-23 DIAGNOSIS — Z6827 Body mass index (BMI) 27.0-27.9, adult: Secondary | ICD-10-CM | POA: Diagnosis not present

## 2018-08-23 DIAGNOSIS — R42 Dizziness and giddiness: Secondary | ICD-10-CM | POA: Diagnosis not present

## 2018-08-23 DIAGNOSIS — E1165 Type 2 diabetes mellitus with hyperglycemia: Secondary | ICD-10-CM | POA: Diagnosis not present

## 2018-08-25 ENCOUNTER — Inpatient Hospital Stay (HOSPITAL_COMMUNITY)
Admission: RE | Admit: 2018-08-25 | Discharge: 2018-09-04 | DRG: 330 | Disposition: A | Discharge status: Home / Self Care | Payer: Medicare Other | Attending: General Surgery | Admitting: General Surgery

## 2018-08-25 ENCOUNTER — Other Ambulatory Visit: Payer: Self-pay

## 2018-08-25 ENCOUNTER — Encounter (HOSPITAL_COMMUNITY): Payer: Self-pay | Admitting: *Deleted

## 2018-08-25 ENCOUNTER — Ambulatory Visit (HOSPITAL_COMMUNITY): Payer: Medicare Other | Admitting: Anesthesiology

## 2018-08-25 ENCOUNTER — Encounter (HOSPITAL_COMMUNITY)
Admission: RE | Disposition: A | Discharge status: Home / Self Care | Payer: Self-pay | Source: Home / Self Care | Attending: General Surgery

## 2018-08-25 ENCOUNTER — Inpatient Hospital Stay (HOSPITAL_COMMUNITY): Payer: Medicare Other

## 2018-08-25 DIAGNOSIS — Z923 Personal history of irradiation: Secondary | ICD-10-CM

## 2018-08-25 DIAGNOSIS — N990 Postprocedural (acute) (chronic) kidney failure: Secondary | ICD-10-CM | POA: Diagnosis not present

## 2018-08-25 DIAGNOSIS — K660 Peritoneal adhesions (postprocedural) (postinfection): Secondary | ICD-10-CM | POA: Diagnosis present

## 2018-08-25 DIAGNOSIS — I10 Essential (primary) hypertension: Secondary | ICD-10-CM | POA: Diagnosis present

## 2018-08-25 DIAGNOSIS — Z87891 Personal history of nicotine dependence: Secondary | ICD-10-CM

## 2018-08-25 DIAGNOSIS — Y838 Other surgical procedures as the cause of abnormal reaction of the patient, or of later complication, without mention of misadventure at the time of the procedure: Secondary | ICD-10-CM | POA: Diagnosis not present

## 2018-08-25 DIAGNOSIS — K435 Parastomal hernia without obstruction or  gangrene: Secondary | ICD-10-CM | POA: Diagnosis present

## 2018-08-25 DIAGNOSIS — R05 Cough: Secondary | ICD-10-CM | POA: Diagnosis not present

## 2018-08-25 DIAGNOSIS — I2510 Atherosclerotic heart disease of native coronary artery without angina pectoris: Secondary | ICD-10-CM | POA: Diagnosis present

## 2018-08-25 DIAGNOSIS — K432 Incisional hernia without obstruction or gangrene: Secondary | ICD-10-CM | POA: Diagnosis present

## 2018-08-25 DIAGNOSIS — I4821 Permanent atrial fibrillation: Secondary | ICD-10-CM | POA: Diagnosis present

## 2018-08-25 DIAGNOSIS — D72829 Elevated white blood cell count, unspecified: Secondary | ICD-10-CM | POA: Diagnosis not present

## 2018-08-25 DIAGNOSIS — E1165 Type 2 diabetes mellitus with hyperglycemia: Secondary | ICD-10-CM | POA: Diagnosis present

## 2018-08-25 DIAGNOSIS — E039 Hypothyroidism, unspecified: Secondary | ICD-10-CM | POA: Diagnosis present

## 2018-08-25 DIAGNOSIS — N400 Enlarged prostate without lower urinary tract symptoms: Secondary | ICD-10-CM | POA: Diagnosis present

## 2018-08-25 DIAGNOSIS — N9981 Other intraoperative complications of genitourinary system: Secondary | ICD-10-CM

## 2018-08-25 DIAGNOSIS — Z79899 Other long term (current) drug therapy: Secondary | ICD-10-CM

## 2018-08-25 DIAGNOSIS — D500 Iron deficiency anemia secondary to blood loss (chronic): Secondary | ICD-10-CM | POA: Diagnosis present

## 2018-08-25 DIAGNOSIS — I482 Chronic atrial fibrillation, unspecified: Secondary | ICD-10-CM | POA: Diagnosis not present

## 2018-08-25 DIAGNOSIS — C184 Malignant neoplasm of transverse colon: Principal | ICD-10-CM | POA: Diagnosis present

## 2018-08-25 DIAGNOSIS — Z9049 Acquired absence of other specified parts of digestive tract: Secondary | ICD-10-CM

## 2018-08-25 DIAGNOSIS — Z79890 Hormone replacement therapy (postmenopausal): Secondary | ICD-10-CM

## 2018-08-25 DIAGNOSIS — K6389 Other specified diseases of intestine: Secondary | ICD-10-CM | POA: Diagnosis not present

## 2018-08-25 DIAGNOSIS — E875 Hyperkalemia: Secondary | ICD-10-CM | POA: Diagnosis not present

## 2018-08-25 DIAGNOSIS — Y842 Radiological procedure and radiotherapy as the cause of abnormal reaction of the patient, or of later complication, without mention of misadventure at the time of the procedure: Secondary | ICD-10-CM | POA: Diagnosis present

## 2018-08-25 DIAGNOSIS — R000 Tachycardia, unspecified: Secondary | ICD-10-CM | POA: Diagnosis not present

## 2018-08-25 DIAGNOSIS — Z01810 Encounter for preprocedural cardiovascular examination: Secondary | ICD-10-CM | POA: Diagnosis not present

## 2018-08-25 DIAGNOSIS — C189 Malignant neoplasm of colon, unspecified: Secondary | ICD-10-CM | POA: Diagnosis not present

## 2018-08-25 DIAGNOSIS — Z933 Colostomy status: Secondary | ICD-10-CM

## 2018-08-25 DIAGNOSIS — R262 Difficulty in walking, not elsewhere classified: Secondary | ICD-10-CM | POA: Diagnosis present

## 2018-08-25 DIAGNOSIS — Z8249 Family history of ischemic heart disease and other diseases of the circulatory system: Secondary | ICD-10-CM | POA: Diagnosis not present

## 2018-08-25 DIAGNOSIS — S3729XA Other injury of bladder, initial encounter: Secondary | ICD-10-CM | POA: Diagnosis not present

## 2018-08-25 DIAGNOSIS — R0609 Other forms of dyspnea: Secondary | ICD-10-CM | POA: Diagnosis not present

## 2018-08-25 DIAGNOSIS — F329 Major depressive disorder, single episode, unspecified: Secondary | ICD-10-CM | POA: Diagnosis present

## 2018-08-25 DIAGNOSIS — E119 Type 2 diabetes mellitus without complications: Secondary | ICD-10-CM

## 2018-08-25 DIAGNOSIS — R918 Other nonspecific abnormal finding of lung field: Secondary | ICD-10-CM | POA: Diagnosis not present

## 2018-08-25 DIAGNOSIS — R933 Abnormal findings on diagnostic imaging of other parts of digestive tract: Secondary | ICD-10-CM | POA: Diagnosis not present

## 2018-08-25 DIAGNOSIS — Z7982 Long term (current) use of aspirin: Secondary | ICD-10-CM

## 2018-08-25 DIAGNOSIS — Z85048 Personal history of other malignant neoplasm of rectum, rectosigmoid junction, and anus: Secondary | ICD-10-CM

## 2018-08-25 DIAGNOSIS — D62 Acute posthemorrhagic anemia: Secondary | ICD-10-CM

## 2018-08-25 DIAGNOSIS — K567 Ileus, unspecified: Secondary | ICD-10-CM | POA: Diagnosis not present

## 2018-08-25 DIAGNOSIS — K921 Melena: Secondary | ICD-10-CM | POA: Diagnosis not present

## 2018-08-25 DIAGNOSIS — Z86711 Personal history of pulmonary embolism: Secondary | ICD-10-CM

## 2018-08-25 DIAGNOSIS — K5669 Other partial intestinal obstruction: Secondary | ICD-10-CM | POA: Diagnosis not present

## 2018-08-25 DIAGNOSIS — E1169 Type 2 diabetes mellitus with other specified complication: Secondary | ICD-10-CM | POA: Diagnosis not present

## 2018-08-25 DIAGNOSIS — Z7984 Long term (current) use of oral hypoglycemic drugs: Secondary | ICD-10-CM

## 2018-08-25 DIAGNOSIS — Z8673 Personal history of transient ischemic attack (TIA), and cerebral infarction without residual deficits: Secondary | ICD-10-CM

## 2018-08-25 HISTORY — PX: BIOPSY: SHX5522

## 2018-08-25 HISTORY — DX: Type 2 diabetes mellitus without complications: E11.9

## 2018-08-25 HISTORY — DX: Unspecified atrial flutter: I48.92

## 2018-08-25 HISTORY — PX: COLONOSCOPY WITH PROPOFOL: SHX5780

## 2018-08-25 HISTORY — DX: Malignant neoplasm of colon, unspecified: C18.9

## 2018-08-25 HISTORY — DX: Unspecified atrial fibrillation: I48.91

## 2018-08-25 HISTORY — DX: Anemia, unspecified: D64.9

## 2018-08-25 LAB — CBC
HCT: 32.7 % — ABNORMAL LOW (ref 39.0–52.0)
Hemoglobin: 9.6 g/dL — ABNORMAL LOW (ref 13.0–17.0)
MCH: 26.4 pg (ref 26.0–34.0)
MCHC: 29.4 g/dL — ABNORMAL LOW (ref 30.0–36.0)
MCV: 89.8 fL (ref 80.0–100.0)
Platelets: 258 10*3/uL (ref 150–400)
RBC: 3.64 MIL/uL — ABNORMAL LOW (ref 4.22–5.81)
RDW: 14.2 % (ref 11.5–15.5)
WBC: 6.6 10*3/uL (ref 4.0–10.5)
nRBC: 0 % (ref 0.0–0.2)

## 2018-08-25 LAB — COMPREHENSIVE METABOLIC PANEL
ALT: 15 U/L (ref 0–44)
AST: 21 U/L (ref 15–41)
Albumin: 3.2 g/dL — ABNORMAL LOW (ref 3.5–5.0)
Alkaline Phosphatase: 74 U/L (ref 38–126)
Anion gap: 9 (ref 5–15)
BUN: 11 mg/dL (ref 8–23)
CO2: 24 mmol/L (ref 22–32)
CREATININE: 0.83 mg/dL (ref 0.61–1.24)
Calcium: 8.7 mg/dL — ABNORMAL LOW (ref 8.9–10.3)
Chloride: 102 mmol/L (ref 98–111)
GFR calc Af Amer: >60 mL/min (ref >60)
GFR calc non Af Amer: >60 mL/min (ref >60)
Glucose, Bld: 158 mg/dL — ABNORMAL HIGH (ref 70–99)
Potassium: 3.9 mmol/L (ref 3.5–5.1)
SODIUM: 135 mmol/L (ref 135–145)
Total Bilirubin: 0.6 mg/dL (ref 0.3–1.2)
Total Protein: 6.8 g/dL (ref 6.5–8.1)

## 2018-08-25 LAB — TYPE AND SCREEN
ABO/RH(D): A POS
Antibody Screen: NEGATIVE

## 2018-08-25 LAB — GLUCOSE, CAPILLARY
GLUCOSE-CAPILLARY: 152 mg/dL — AB (ref 70–99)
GLUCOSE-CAPILLARY: 158 mg/dL — AB (ref 70–99)
Glucose-Capillary: 167 mg/dL — ABNORMAL HIGH (ref 70–99)

## 2018-08-25 LAB — PROTIME-INR
INR: 1.12
Prothrombin Time: 14.3 seconds (ref 11.4–15.2)

## 2018-08-25 SURGERY — COLONOSCOPY WITH PROPOFOL
Anesthesia: General

## 2018-08-25 MED ORDER — PROPOFOL 500 MG/50ML IV EMUL
INTRAVENOUS | Status: DC | PRN
  Administered 2018-08-25: 200 ug/kg/min via INTRAVENOUS

## 2018-08-25 MED ORDER — PROPOFOL 10 MG/ML IV BOLUS
INTRAVENOUS | Status: DC | PRN
  Administered 2018-08-25: 40 mg via INTRAVENOUS

## 2018-08-25 MED ORDER — LEVOTHYROXINE SODIUM 50 MCG PO TABS
50.0000 ug | ORAL_TABLET | Freq: Every day | ORAL | Status: DC
  Administered 2018-08-30 – 2018-09-04 (×6): 50 ug via ORAL
  Filled 2018-08-25: qty 1
  Filled 2018-08-25 (×2): qty 2
  Filled 2018-08-25: qty 1
  Filled 2018-08-25: qty 2
  Filled 2018-08-25: qty 1
  Filled 2018-08-25: qty 2

## 2018-08-25 MED ORDER — CHLORHEXIDINE GLUCONATE CLOTH 2 % EX PADS: 6.0000 | MEDICATED_PAD | Freq: Once | CUTANEOUS | Status: DC

## 2018-08-25 MED ORDER — PHENOL 1.4 % MT LIQD
1.0000 | OROMUCOSAL | Status: DC | PRN
  Administered 2018-08-25: 1 via OROMUCOSAL
  Filled 2018-08-25: qty 177

## 2018-08-25 MED ORDER — LACTATED RINGERS IV SOLN
INTRAVENOUS | Status: DC
  Administered 2018-08-25: 11:00:00 via INTRAVENOUS

## 2018-08-25 MED ORDER — SODIUM CHLORIDE 0.9% FLUSH
3.0000 mL | INTRAVENOUS | Status: DC | PRN
  Filled 2018-08-25: qty 3

## 2018-08-25 MED ORDER — STERILE WATER FOR IRRIGATION IR SOLN
Status: DC | PRN
  Administered 2018-08-25: 100 mL

## 2018-08-25 MED ORDER — ACETAMINOPHEN 325 MG PO TABS: 650.0000 mg | ORAL_TABLET | Freq: Four times a day (QID) | ORAL | Status: DC | PRN

## 2018-08-25 MED ORDER — HYDROCODONE-ACETAMINOPHEN 7.5-325 MG PO TABS: 1.0000 | ORAL_TABLET | Freq: Once | ORAL | Status: DC | PRN

## 2018-08-25 MED ORDER — ACETAMINOPHEN 650 MG RE SUPP: 650.0000 mg | Freq: Four times a day (QID) | RECTAL | Status: DC | PRN

## 2018-08-25 MED ORDER — IOHEXOL 300 MG/ML  SOLN
75.0000 mL | Freq: Once | INTRAMUSCULAR | Status: AC | PRN
  Administered 2018-08-25: 75 mL via INTRAVENOUS

## 2018-08-25 MED ORDER — SODIUM CHLORIDE 0.9% FLUSH
3.0000 mL | Freq: Two times a day (BID) | INTRAVENOUS | Status: DC
  Administered 2018-08-25 – 2018-09-03 (×17): 3 mL via INTRAVENOUS

## 2018-08-25 MED ORDER — MIDAZOLAM HCL 2 MG/2ML IJ SOLN: 0.5000 mg | Freq: Once | INTRAMUSCULAR | Status: DC | PRN

## 2018-08-25 MED ORDER — TAMSULOSIN HCL 0.4 MG PO CAPS
0.4000 mg | ORAL_CAPSULE | Freq: Every day | ORAL | Status: DC
  Administered 2018-08-28 – 2018-09-03 (×7): 0.4 mg via ORAL
  Filled 2018-08-25 (×9): qty 1

## 2018-08-25 MED ORDER — ONDANSETRON HCL 4 MG/2ML IJ SOLN: 4.0000 mg | Freq: Four times a day (QID) | INTRAMUSCULAR | Status: DC | PRN

## 2018-08-25 MED ORDER — PROMETHAZINE HCL 25 MG/ML IJ SOLN: 6.2500 mg | INTRAMUSCULAR | Status: DC | PRN

## 2018-08-25 MED ORDER — SODIUM CHLORIDE 0.9 % IV SOLN: 250.0000 mL | INTRAVENOUS | Status: DC | PRN

## 2018-08-25 MED ORDER — HYDROMORPHONE HCL 1 MG/ML IJ SOLN: 0.2500 mg | INTRAMUSCULAR | Status: DC | PRN

## 2018-08-25 MED ORDER — ONDANSETRON HCL 4 MG PO TABS: 4.0000 mg | ORAL_TABLET | Freq: Four times a day (QID) | ORAL | Status: DC | PRN

## 2018-08-25 MED ORDER — INSULIN ASPART 100 UNIT/ML ~~LOC~~ SOLN
0.0000 [IU] | Freq: Three times a day (TID) | SUBCUTANEOUS | Status: DC
  Administered 2018-08-25: 3 [IU] via SUBCUTANEOUS
  Administered 2018-08-26 (×2): 5 [IU] via SUBCUTANEOUS
  Administered 2018-08-26: 3 [IU] via SUBCUTANEOUS
  Administered 2018-08-27: 8 [IU] via SUBCUTANEOUS

## 2018-08-25 NOTE — Consult Note (Signed)
Jose Lawrence  Reason for Consult: Colon mass/ Cancer  Referring Physician:  [name] Jose Lawrence is a 78 y.o. male.  HPI: [name] is a 78 yo with a prior history of colon cancer s/p LAR in 2008, followed by a re-resection with APR in 2009, followed by a SBO with lysis of adhesion in 2010.  Then subsequent exploration for pneumoperitoneum of unknown etiology in 2017 at [facility] after having severe acute onset of abdominal pain. He says that over the past few weeks he has been having pain at his ostomy and bleeding. He was evaluated in the ED and on CT was found to have an apple core lesion of the transverse colon. Dr. Laural Golden scoped him today and has found a large mass that is near obstructing with sequale of bleeding. He was reported to have lost 60 lbs in the last year but trying in addition to having no appetite.  He is only on an aspirin for his A fib. He had a prior PE and was on Eliquis but is not taking anything now.  He was recently seen by cardiology for SOB. [name] notes indicate a recent ECHO with EF of 70% and recommended PFTs, which I cannot find in the chart.  Dr. [name] indicated he might need more testing.    Past Medical History:  Diagnosis Date  . A-fib ([place])   . BPH (benign prostatic hypertrophy)   . Cancer ([place])    colon  . Diabetes mellitus ([place])   . Hypothyroidism   . Lacunar infarction ([place]) [phone]   RT 3rd NERVE PALSY, SB [name]  . PE (pulmonary embolism)    on eliquis 11-2015    Past Surgical History:  Procedure Laterality Date  . ABDOMINOPERINEAL PROCTOCOLECTOMY  2009   end colostomy   . COLECTOMY  2008  . COLOSTOMY    . EXPLORATORY LAPAROTOMY  2017   ? pneumoperitoneum of unknown etiology/ negative Ex lap  . LAPAROSCOPIC CHOLECYSTECTOMY  2007  . SBO SURGERY  06/2009    Family History  Problem Relation Age of Onset  . Heart disease Father   . Heart failure Father   . Heart attack Father      Social History   Tobacco Use  . Smoking status: Former Smoker    Packs/day: 1.00    Years: 27.00    Pack years: 27.00    Types: Cigarettes    Start date: 01/27/1964    Last attempt to quit: 01/27/1991    Years since quitting: 27.5  . Smokeless tobacco: Former Systems developer    Types: Chew    Quit date: 08/10/1995  Substance Use Topics  . Alcohol use: Never    Alcohol/week: 0.0 standard drinks    Frequency: Never  . Drug use: Never    Medications: I have reviewed the patient's current medications. Current Facility-Administered Medications  Medication Dose Route Frequency Provider Last Rate Last Dose  . Chlorhexidine Gluconate Cloth 2 % PADS 6 each  6 each Topical Once [name], [name], MD       And  . Chlorhexidine Gluconate Cloth 2 % PADS 6 each  6 each Topical Once [name], [name], MD      . HYDROcodone-acetaminophen (NORCO) 7.5-325 MG per tablet 1 tablet  1 tablet Oral Once PRN [name], MD      . HYDROmorphone (DILAUDID) injection 0.25-0.5 mg  0.25-0.5 mg Intravenous Q5 min PRN [name], MD      .  lactated ringers infusion   Intravenous Continuous [name], MD 50 mL/hr at 08/25/18 1120    . midazolam (VERSED) injection 0.5-2 mg  0.5-2 mg Intravenous Once PRN [name], MD      . promethazine (PHENERGAN) injection 6.25-12.5 mg  6.25-12.5 mg Intravenous Q15 min PRN [name], MD       Allergies as of 08/25/2018   No Known Allergies     Medication List    STOP taking these medications   aspirin 81 MG tablet     TAKE these medications   cetirizine 10 MG tablet Commonly known as:  ZYRTEC Take 10 mg by mouth daily.   glipiZIDE 5 MG tablet Commonly known as:  GLUCOTROL Take 10 mg by mouth 2 (two) times daily before a meal.   levothyroxine 50 MCG tablet Commonly known as:  SYNTHROID, LEVOTHROID Take 50 mcg by mouth daily before breakfast.   tamsulosin 0.4 MG Caps capsule Commonly known as:  FLOMAX Take 0.4 mg by mouth at bedtime.    valsartan 80 MG tablet Commonly known as:  DIOVAN Take 80 mg by mouth daily.        ROS:  A comprehensive review of systems was negative except for: Gastrointestinal: positive for bleeding from ostomy, pain at ostomy site  Blood pressure (!) 145/83, pulse 75, temperature 98.2 F (36.8 C), resp. rate 18, height 6' (1.829 m), weight 87.9 kg, SpO2 100 %. Physical Exam Vitals signs reviewed.  Constitutional:      Appearance: Normal appearance.  HENT:     Head: Normocephalic and atraumatic.     Nose: Nose normal.     Mouth/Throat:     Mouth: Mucous membranes are moist.  Eyes:     Extraocular Movements: Extraocular movements intact.     Pupils: Pupils are equal, round, and reactive to light.  Neck:     Musculoskeletal: Normal range of motion.  Cardiovascular:     Rate and Rhythm: Normal rate. Rhythm irregular.  Pulmonary:     Breath sounds: Normal breath sounds.  Abdominal:     General: There is no distension.     Palpations: Abdomen is soft.     Tenderness: There is no abdominal tenderness.     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        General: No swelling.  Skin:    General: Skin is warm and dry.  Neurological:     General: No focal deficit present.     Mental Status: He is alert and oriented to person, place, and time.  Psychiatric:        Mood and Affect: Mood normal.        Behavior: Behavior normal.        Thought Content: Thought content normal.        Judgment: Judgment normal.     Results: Results for orders placed or performed during the hospital encounter of 08/25/18 (from the past 48 hour(s))  Glucose, capillary     Status: Abnormal   Collection Time: 08/25/18 10:42 AM  Result Value Ref Range   Glucose-Capillary 167 (H) 70 - 99 mg/dL  Glucose, capillary     Status: Abnormal   Collection Time: 08/25/18 12:29 PM  Result Value Ref Range    Glucose-Capillary 152 (H) 70 - 99 mg/dL    CT a/p personally reviewed - Apple  Core lesion transverse colon, parastomal hernia  around colostomy   Assessment & Plan:  Jose Lawrence is a 78 y.o. male with a colon cancer in his transverse colon. He has been scoped by Dr. Laural Golden. He has a prior history of colon cancer s/p LAR and subsequent APR. He has also been explored since those surgeries for SBO and pneumoperitoneum of unknown etiology.  Will proceed with completion colectomy on Monday given the cancer and bleeding. Dr. Olevia Perches photos show it near obstructing but he was able to pass the scope.    Discussed ileostomy and possibility of dehydration with the patient. Will discuss surgery more over the weekend. Discussed clears for weekend since he already had a bowel preparation. Discussed risk of hernia given history and parastomal hernia  Discussed Cardiac history and patient has been seen by Dr. [name] last month. His note indicated possibly needing ischemic workup. Will ask them to weigh in. If bleeds, would give some pRBC as the bleeding does not seem that vigorous, will plan for surgery Monday.  Asked Dr. Roderic Palau to get Cardiology to see.   Will get CT chest to complete workup. Has had CT a/p an LFTs.   All questions were answered to the satisfaction of the patient.   [name] 08/25/2018, 1:37 PM

## 2018-08-25 NOTE — Anesthesia Procedure Notes (Signed)
Procedure Name: MAC Date/Time: 08/25/2018 11:58 AM Performed by: Vista Deck, CRNA Pre-anesthesia Checklist: Patient identified, Emergency Drugs available, Suction available, Timeout performed and Patient being monitored Patient Re-evaluated:Patient Re-evaluated prior to induction Oxygen Delivery Method: Non-rebreather mask

## 2018-08-25 NOTE — Op Note (Addendum)
[facility] Patient Name: Jose Lawrence Procedure Date: 08/25/2018 11:18 AM MRN: [id number] Date of Birth: 03-Jan-1941 Attending MD: Hildred Laser , MD CSN: [id number] Age: 78 Admit Type: Outpatient Procedure:                Colonoscopy Indications:              Hematochezia, Abnormal CT of the GI tract Providers:                Hildred Laser, MD, Rosina Lowenstein, RN, Aram Candela Referring MD:             Kem Parkinson, [facility] Medicines:                Propofol per Anesthesia Complications:            No immediate complications. Estimated Blood Loss:     Estimated blood loss was minimal. Procedure:                Pre-Anesthesia Assessment:                           - Prior to the procedure, a History and Physical                            was performed, and patient medications and                            allergies were reviewed. The patient's tolerance of                            previous anesthesia was also reviewed. The risks                            and benefits of the procedure and the sedation                            options and risks were discussed with the patient.                            All questions were answered, and informed consent                            was obtained. Prior Anticoagulants: The patient                            last took aspirin 2 days prior to the procedure.                            ASA Grade Assessment: III - A patient with severe                            systemic disease. After reviewing the risks and                            benefits, the patient was deemed in satisfactory  condition to undergo the procedure.                           After obtaining informed consent, the colonoscope                            was passed under direct vision. Throughout the                            procedure, the patient's blood pressure, pulse, and                            oxygen saturations were monitored  continuously. The                            [id number]-[id number] ([id number]) scope was introduced through                            the anus and advanced to the the cecum, identified                            by appendiceal orifice and ileocecal valve. The                            ileocecal valve and the appendiceal orifice were                            photographed. Scope In: 12:03:41 PM Scope Out: 12:15:47 PM Scope Withdrawal Time: 0 hours 6 minutes 9 seconds  Total Procedure Duration: 0 hours 12 minutes 6 seconds  Findings:      There was evidence of a widely patent end colostomy in the descending       colon. This was characterized by healthy appearing mucosa.      A fungating and polypoid partially obstructing large mass was found in       the mid transverse colon. The mass was circumferential. Oozing was       present. This was biopsied with a cold forceps for histology.      The descending colon, proximal transverse colon, hepatic flexure,       ascending colon and cecum appeared normal. Impression:               - Widely patent end colostomy with healthy                            appearing mucosa in the descending colon.                           - Malignant partially obstructing tumor in the mid                            transverse colon with active bleeding. Biopsied.                           - The descending colon, proximal transverse colon,  hepatic flexure, ascending colon and cecum are                            normal. Moderate Sedation:      Per Anesthesia Care Recommendation:           - Patient has a contact number available for                            emergencies. The signs and symptoms of potential                            delayed complications were discussed with the                            patient. Return to normal activities tomorrow.                            Written discharge instructions were provided to the                             patient.                           - Admit the patient to hospital ward for ongoing                            care.                           - Clear liquid diet today.                           - Continue present medications.                           - Await pathology results.                           Surgical consult. Procedure Code(s):        --- Professional ---                           [phone], Colonoscopy, flexible; with biopsy, single                            or multiple Diagnosis Code(s):        --- Professional ---                           Z93.3, Colostomy status                           C18.4, Malignant neoplasm of transverse colon                           K56.690, Other partial intestinal obstruction  K92.1, Melena (includes Hematochezia)                           R93.3, Abnormal findings on diagnostic imaging of                            other parts of digestive tract CPT copyright 2018 [employer]. All rights reserved. The codes documented in this report are preliminary and upon coder review may  be revised to meet current compliance requirements. Hildred Laser, MD Hildred Laser, MD 08/25/2018 12:47:20 PM This report has been signed electronically. Number of Addenda: 0

## 2018-08-25 NOTE — H&P (Signed)
Jose Lawrence is an 78 y.o. male.   Chief Complaint: Patient is here for colonoscopy. HPI: Patient is 78 year old Caucasian male who has history of colon carcinoma.  He had resection in 2009.  6 months later he had to go back for residual disease.  He underwent APR.  He did fine.  Few years later he had emergency laparotomy for pneumoperitoneum.  Surgery was done at [facility] and no bowel perforation was found.  His last colonoscopy was about 3 years ago.  He presented to emergency room earlier this week with frank bleeding into colostomy.  His hemoglobin was 9.7.  Patient underwent abdominal pelvic CT which showed apple core lesion in mid transverse colon.  He is therefore returning for diagnostic colonoscopy.  He did notice some blood when he took the prep yesterday.  He notices some lower abdominal discomfort.  He has lost 60 pounds last year according to his wife and their daughter.  He has been trying to lose weight but then he has no appetite.  He has not had any nausea or vomiting.  He is on low-dose aspirin.  Last dose was 2 days ago.  Patient has history of atrial fibrillation and pulmonary embolism.  He took an anticoagulant for 1 month and stopped it.  He states his co-pay was over $300 a month and he could not afford it. Family history is negative for CRC.  Past Medical History:  Diagnosis Date  . A-fib ([place])   . BPH (benign prostatic hypertrophy)   . Cancer ([place])    colon  . Diabetes mellitus ([place])   . Hypothyroidism   . Lacunar infarction ([place]) [phone]   RT 3rd NERVE PALSY, SB [name]  . PE (pulmonary embolism)    on eliquis 11-2015    Past Surgical History:  Procedure Laterality Date  . CHOLECYSTECTOMY    . COLOSTOMY    . SBO SURGERY  06/2009    Family History  Problem Relation Age of Onset  . Heart disease Father   . Heart failure Father   . Heart attack Father    Social History:  reports that he quit smoking about 27 years ago. His smoking use included  cigarettes. He started smoking about 54 years ago. He has a 27.00 pack-year smoking history. He quit smokeless tobacco use about 23 years ago.  His smokeless tobacco use included chew. He reports that he does not drink alcohol or use drugs.  Allergies: No Known Allergies  Medications Prior to Admission  Medication Sig Dispense Refill  . aspirin 81 MG tablet Take 81 mg by mouth daily.    . cetirizine (ZYRTEC) 10 MG tablet Take 10 mg by mouth daily.    Marland Kitchen glipiZIDE (GLUCOTROL) 5 MG tablet Take 10 mg by mouth 2 (two) times daily before a meal.     . levothyroxine (SYNTHROID, LEVOTHROID) 50 MCG tablet Take 50 mcg by mouth daily before breakfast.    . tamsulosin (FLOMAX) 0.4 MG CAPS capsule Take 0.4 mg by mouth at bedtime.     . valsartan (DIOVAN) 80 MG tablet Take 80 mg by mouth daily.      Results for orders placed or performed during the hospital encounter of 08/25/18 (from the past 48 hour(s))  Glucose, capillary     Status: Abnormal   Collection Time: 08/25/18 10:42 AM  Result Value Ref Range   Glucose-Capillary 167 (H) 70 - 99 mg/dL   No results found.  ROS  Blood pressure (!) 137/98,  pulse 98, temperature 98.3 F (36.8 C), temperature source Oral, resp. rate (!) 24, height 6' (1.829 m), weight 87.9 kg, SpO2 98 %. Physical Exam  Constitutional: He appears well-developed and well-nourished.  HENT:  Mouth/Throat: Oropharynx is clear and moist.  Eyes: Conjunctivae are normal. No scleral icterus.  Neck: No thyromegaly present.  Cardiovascular:  Irregular rhythm normal S1 and S2.  No murmur gallop noted.  Respiratory: Effort normal and breath sounds normal.  GI:  He has midline scar.  There is incisional hernia below the level of umbilicus.  Colostomy bag has scant amount of brownish stool.  Abdomen is soft nontender with organomegaly or masses.  Musculoskeletal:        General: No edema.  Lymphadenopathy:    He has no cervical adenopathy.  Skin: Skin is warm and dry.      Assessment/Plan Abnormal CT suggesting apple core lesion in mid transverse colon. Rectal bleeding. History of colon carcinoma. Diagnostic colonoscopy.  Hildred Laser, MD 08/25/2018, 11:48 AM

## 2018-08-25 NOTE — H&P (Addendum)
History and Physical    [name]:[id number] DOB: 05-23-1941 DOA: 08/25/2018  PCP: Glenda Chroman, MD  Patient coming from: Home  I have personally briefly reviewed patient's old medical records in [place]  Chief Complaint: Bloody stools  HPI: Jose Lawrence is a 78 y.o. male with medical history significant of colon cancer in the past status post LAR and APR in 2009.  He reports that for the past few weeks he has noticed dark-colored stool in his ostomy.  Wife reports that is been bloody.  He has been having difficulty ambulating, feels he is unsteady on his feet.  He has had had worsening shortness of breath, worse on exertion.  This is been present since Thanksgiving.  He does not take any anticoagulation.  He is chronically on aspirin.  Denies any chest pain.  He has not had any vomiting.  He was seen in the emergency room on 1/13 and was arranged to have follow-up with GI for further work-up of heme positive stools.  Hemoglobin at that time was 9.7 and CT abdomen showed lesion of the transverse colon, concerning for malignancy.  [facility] for scheduled [facility].  During his colonoscopy, he was noted to have a friable lesion in the transverse colon which was a source of his heme positive stools.  General surgery was consulted and he was referred to the hospital for admission.  Review of Systems: As per HPI otherwise 10 point review of systems negative.    Past Medical History:  Diagnosis Date  . A-fib ([place])   . BPH (benign prostatic hypertrophy)   . Cancer ([place])    colon  . Diabetes mellitus ([place])   . Hypothyroidism   . Lacunar infarction ([place]) [phone]   RT 3rd NERVE PALSY, SB [name]  . PE (pulmonary embolism)    on eliquis 11-2015    Past Surgical History:  Procedure Laterality Date  . ABDOMINOPERINEAL PROCTOCOLECTOMY  2009   end colostomy   . COLECTOMY  2008  . COLOSTOMY    . EXPLORATORY LAPAROTOMY  2017   ?  pneumoperitoneum of unknown etiology/ negative Ex lap  . LAPAROSCOPIC CHOLECYSTECTOMY  2007  . SBO SURGERY  06/2009    Social History:  reports that he quit smoking about 27 years ago. His smoking use included cigarettes. He started smoking about 54 years ago. He has a 27.00 pack-year smoking history. He quit smokeless tobacco use about 23 years ago.  His smokeless tobacco use included chew. He reports that he does not drink alcohol or use drugs.  No Known Allergies  Family History  Problem Relation Age of Onset  . Heart disease Father   . Heart failure Father   . Heart attack Father     Prior to Admission medications   Medication Sig Start Date End Date Taking? Authorizing Provider  aspirin 81 MG tablet Take 81 mg by mouth daily.    [provider]  cetirizine (ZYRTEC) 10 MG tablet Take 10 mg by mouth daily.    [provider]  glipiZIDE (GLUCOTROL) 5 MG tablet Take 10 mg by mouth 2 (two) times daily before a meal.     [provider]  levothyroxine (SYNTHROID, LEVOTHROID) 50 MCG tablet Take 50 mcg by mouth daily before breakfast.    [provider]  tamsulosin (FLOMAX) 0.4 MG CAPS capsule Take 0.4 mg by mouth at bedtime.     [provider]  valsartan (  DIOVAN) 80 MG tablet Take 80 mg by mouth daily.    [provider]    Physical Exam: Vitals:   08/25/18 1400 08/25/18 1415 08/25/18 1430 08/25/18 1440  BP: 137/77 121/72  (!) 143/94  Pulse: 85 88 86 78  Resp: 19 (!) 22 20 18   Temp:    98.9 F (37.2 C)  TempSrc:    Oral  SpO2: 99% 97% 96% 100%  Weight:      Height:        Constitutional: NAD, calm, comfortable Eyes: PERRL, lids and conjunctivae normal ENMT: Mucous membranes are moist. Posterior pharynx clear of any exudate or lesions.Normal dentition.  Neck: normal, supple, no masses, no thyromegaly Respiratory: clear to auscultation bilaterally, no wheezing, no crackles. Normal respiratory effort. No accessory muscle  use.  Cardiovascular: Irregular rate and rhythm, no murmurs / rubs / gallops. No extremity edema. 2+ pedal pulses. No carotid bruits.  Abdomen: no tenderness, no masses palpated. No hepatosplenomegaly. Bowel sounds positive.  Colostomy left lower quadrant with empty bag Musculoskeletal: no clubbing / cyanosis. No joint deformity upper and lower extremities. Good ROM, no contractures. Normal muscle tone.  Skin: no rashes, lesions, ulcers. No induration Neurologic: CN 2-12 grossly intact. Sensation intact, DTR normal. Strength 5/5 in all 4.  Psychiatric: Normal judgment and insight. Alert and oriented x 3. Normal mood.    Labs on Admission: I have personally reviewed following labs and imaging studies  CBC: Recent Labs  Lab 08/21/18 0853  WBC 7.7  NEUTROABS 5.3  HGB 9.7*  HCT 33.2*  MCV 88.3  PLT 500   Basic Metabolic Panel: Recent Labs  Lab 08/21/18 0853  NA 136  K 3.9  CL 105  CO2 24  GLUCOSE 147*  BUN 12  CREATININE 0.80  CALCIUM 8.8*   GFR: Estimated Creatinine Clearance: 87.6 mL/min (by C-G formula based on SCr of 0.8 mg/dL). Liver Function Tests: Recent Labs  Lab 08/21/18 0853  AST 22  ALT 15  ALKPHOS 69  BILITOT 0.5  PROT 6.8  ALBUMIN 3.2*   Recent Labs  Lab 08/21/18 0853  LIPASE 23   No results for input(s): AMMONIA in the last 168 hours. Coagulation Profile: No results for input(s): INR, PROTIME in the last 168 hours. Cardiac Enzymes: No results for input(s): CKTOTAL, CKMB, CKMBINDEX, TROPONINI in the last 168 hours. BNP (last 3 results) No results for input(s): PROBNP in the last 8760 hours. HbA1C: No results for input(s): HGBA1C in the last 72 hours. CBG: Recent Labs  Lab 08/25/18 1042 08/25/18 1229  GLUCAP 167* 152*   Lipid Profile: No results for input(s): CHOL, HDL, LDLCALC, TRIG, CHOLHDL, LDLDIRECT in the last 72 hours. Thyroid Function Tests: No results for input(s): TSH, T4TOTAL, FREET4, T3FREE, THYROIDAB in the last 72  hours. Anemia Panel: No results for input(s): VITAMINB12, FOLATE, FERRITIN, TIBC, IRON, RETICCTPCT in the last 72 hours. Urine analysis: No results found for: COLORURINE, APPEARANCEUR, LABSPEC, PHURINE, GLUCOSEU, HGBUR, BILIRUBINUR, KETONESUR, PROTEINUR, UROBILINOGEN, NITRITE, LEUKOCYTESUR  Radiological Exams on Admission: No results found.  EKG: Independently reviewed.  Atrial fibrillation  Assessment/Plan Principal Problem:   Mass of colon Active Problems:   Iron deficiency anemia due to chronic blood loss   Hypothyroidism   HTN (hypertension)   DM (diabetes mellitus), type 2 (HCC)   Colostomy in place [facility])   BPH (benign prostatic hyperplasia)     1. Colon mass.  Seen by general surgery. Plan is for total colectomy with ileostomy.  We will keep the  patient on clear liquids.  CT of the chest has been ordered to complete staging.  Since he recently saw cardiology in the end of December and there was concern that he may need further ischemic work-up for shortness of breath, preop cardiology consultation has been requested. 2. Anemia secondary to chronic blood loss.  Repeat hemoglobin today is currently in process.  He has been typed and screened.  Will transfuse as necessary. 3. Diabetes.  Hold oral agents.  Start on sliding scale insulin. 4. Hypertension.  He is chronically on valsartan.  Will hold for now in the setting of bleeding. 5. BPH.  Continue on Flomax. 6. Hypothyroidism.  Continue on Synthroid. 7. Chronic atrial fibrillation.  Heart rate is currently controlled.  Monitor on telemetry.  He has not been on anticoagulation due to issues with anemia.  He is chronically on aspirin.  DVT prophylaxis: SCD Code Status: Full code Family Communication: Discussed with multiple family was at the bedside Disposition Plan: Discharge home once improved Consults called: General surgery, cardiology Admission status: Inpatient, telemetry  Kathie Dike MD [facility]   If  7PM-7AM, please contact night-coverage [email or web address]   08/25/2018, 3:41 PM

## 2018-08-25 NOTE — H&P (View-Only) (Signed)
[place]  Reason for Consult: Colon mass/ Cancer  Referring Physician:  [name] Jose Lawrence is a 78 y.o. male.  HPI: Jose Lawrence is a 78 yo with a prior history of colon cancer s/p LAR in 2008, followed by a re-resection with APR in 2009, followed by a SBO with lysis of adhesion in 2010.  Then subsequent exploration for pneumoperitoneum of unknown etiology in 2017 at [facility] after having severe acute onset of abdominal pain. He says that over the past few weeks he has been having pain at his ostomy and bleeding. He was evaluated in the ED and on CT was found to have an apple core lesion of the transverse colon. Dr. Laural Golden scoped him today and has found a large mass that is near obstructing with sequale of bleeding. He was reported to have lost 60 lbs in the last year but trying in addition to having no appetite.  He is only on an aspirin for his A fib. He had a prior PE and was on Eliquis but is not taking anything now.  He was recently seen by cardiology for SOB. [name] notes indicate a recent ECHO with EF of 70% and recommended PFTs, which I cannot find in the chart.  Dr. [name] indicated he might need more testing.    Past Medical History:  Diagnosis Date  . A-fib ([place])   . BPH (benign prostatic hypertrophy)   . Cancer ([place])    colon  . Diabetes mellitus ([place])   . Hypothyroidism   . Lacunar infarction ([place]) [phone]   RT 3rd NERVE PALSY, SB [name]  . PE (pulmonary embolism)    on eliquis 11-2015    Past Surgical History:  Procedure Laterality Date  . ABDOMINOPERINEAL PROCTOCOLECTOMY  2009   end colostomy   . COLECTOMY  2008  . COLOSTOMY    . EXPLORATORY LAPAROTOMY  2017   ? pneumoperitoneum of unknown etiology/ negative Ex lap  . LAPAROSCOPIC CHOLECYSTECTOMY  2007  . SBO SURGERY  06/2009    Family History  Problem Relation Age of Onset  . Heart disease Father   . Heart failure Father   . Heart attack Father      Social History   Tobacco Use  . Smoking status: Former Smoker    Packs/day: 1.00    Years: 27.00    Pack years: 27.00    Types: Cigarettes    Start date: 01/27/1964    Last attempt to quit: 01/27/1991    Years since quitting: 27.5  . Smokeless tobacco: Former Systems developer    Types: Chew    Quit date: 08/10/1995  Substance Use Topics  . Alcohol use: Never    Alcohol/week: 0.0 standard drinks    Frequency: Never  . Drug use: Never    Medications: I have reviewed the patient's current medications. Current Facility-Administered Medications  Medication Dose Route Frequency Provider Last Rate Last Dose  . Chlorhexidine Gluconate Cloth 2 % PADS 6 each  6 each Topical Once [name], [name], MD       And  . Chlorhexidine Gluconate Cloth 2 % PADS 6 each  6 each Topical Once [name], [name], MD      . HYDROcodone-acetaminophen (NORCO) 7.5-325 MG per tablet 1 tablet  1 tablet Oral Once PRN [name], MD      . HYDROmorphone (DILAUDID) injection 0.25-0.5 mg  0.25-0.5 mg Intravenous Q5 min PRN [name], MD      .  lactated ringers infusion   Intravenous Continuous [name], MD 50 mL/hr at 08/25/18 1120    . midazolam (VERSED) injection 0.5-2 mg  0.5-2 mg Intravenous Once PRN [name], MD      . promethazine (PHENERGAN) injection 6.25-12.5 mg  6.25-12.5 mg Intravenous Q15 min PRN [name], MD       Allergies as of 08/25/2018   No Known Allergies     Medication List    STOP taking these medications   aspirin 81 MG tablet     TAKE these medications   cetirizine 10 MG tablet Commonly known as:  ZYRTEC Take 10 mg by mouth daily.   glipiZIDE 5 MG tablet Commonly known as:  GLUCOTROL Take 10 mg by mouth 2 (two) times daily before a meal.   levothyroxine 50 MCG tablet Commonly known as:  SYNTHROID, LEVOTHROID Take 50 mcg by mouth daily before breakfast.   tamsulosin 0.4 MG Caps capsule Commonly known as:  FLOMAX Take 0.4 mg by mouth at bedtime.    valsartan 80 MG tablet Commonly known as:  DIOVAN Take 80 mg by mouth daily.        ROS:  A comprehensive review of systems was negative except for: Gastrointestinal: positive for bleeding from ostomy, pain at ostomy site  Blood pressure (!) 145/83, pulse 75, temperature 98.2 F (36.8 C), resp. rate 18, height 6' (1.829 m), weight 87.9 kg, SpO2 100 %. Physical Exam Vitals signs reviewed.  Constitutional:      Appearance: Normal appearance.  HENT:     Head: Normocephalic and atraumatic.     Nose: Nose normal.     Mouth/Throat:     Mouth: Mucous membranes are moist.  Eyes:     Extraocular Movements: Extraocular movements intact.     Pupils: Pupils are equal, round, and reactive to light.  Neck:     Musculoskeletal: Normal range of motion.  Cardiovascular:     Rate and Rhythm: Normal rate. Rhythm irregular.  Pulmonary:     Breath sounds: Normal breath sounds.  Abdominal:     General: There is no distension.     Palpations: Abdomen is soft.     Tenderness: There is no abdominal tenderness.     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        General: No swelling.  Skin:    General: Skin is warm and dry.  Neurological:     General: No focal deficit present.     Mental Status: He is alert and oriented to person, place, and time.  Psychiatric:        Mood and Affect: Mood normal.        Behavior: Behavior normal.        Thought Content: Thought content normal.        Judgment: Judgment normal.     Results: Results for orders placed or performed during the hospital encounter of 08/25/18 (from the past 48 hour(s))  Glucose, capillary     Status: Abnormal   Collection Time: 08/25/18 10:42 AM  Result Value Ref Range   Glucose-Capillary 167 (H) 70 - 99 mg/dL  Glucose, capillary     Status: Abnormal   Collection Time: 08/25/18 12:29 PM  Result Value Ref Range    Glucose-Capillary 152 (H) 70 - 99 mg/dL    CT a/p personally reviewed - Apple  Core lesion transverse colon, parastomal hernia  around colostomy   Assessment & Plan:  AMIN Jose Lawrence is a 78 y.o. male with a colon cancer in his transverse colon. He has been scoped by Dr. Laural Golden. He has a prior history of colon cancer s/p LAR and subsequent APR. He has also been explored since those surgeries for SBO and pneumoperitoneum of unknown etiology.  Will proceed with completion colectomy on Monday given the cancer and bleeding. Dr. Olevia Perches photos show it near obstructing but he was able to pass the scope.    Discussed ileostomy and possibility of dehydration with the patient. Will discuss surgery more over the weekend. Discussed clears for weekend since he already had a bowel preparation. Discussed risk of hernia given history and parastomal hernia  Discussed Cardiac history and patient has been seen by Dr. [name] last month. His note indicated possibly needing ischemic workup. Will ask them to weigh in. If bleeds, would give some pRBC as the bleeding does not seem that vigorous, will plan for surgery Monday.  Asked Dr. Roderic Palau to get Cardiology to see.   Will get CT chest to complete workup. Has had CT a/p an LFTs.   All questions were answered to the satisfaction of the patient.   [name] 08/25/2018, 1:37 PM

## 2018-08-25 NOTE — Consult Note (Signed)
Cardiology Consultation:   Patient ID: MASAHIRO IGLESIA; [id number]; 08-22-1940   Admit date: 08/25/2018 Date of Consult: 08/25/2018  Primary Care Provider: Glenda Chroman, MD Primary Cardiologist: Dr. Carlyle Dolly   Patient Profile:   SALIH WILLIAMSON is a 78 y.o. male with a history of permanent atrial fibrillation (not anticoagulated with history of recurring anemia and recent GI bleed), previous diagnosis of colon cancer status post LAR in 2008, type 2 diabetes mellitus, pulmonary embolus in 2017 following surgery, and previous lacunar infarct, who is being seen today for preoperative cardiac evaluation at the request of Dr. Constance Haw.  History of Present Illness:   Mr. Legrand is currently admitted to the hospital with recurring anemia, recent melena at his ostomy site, and progressive dyspnea on exertion.  CT imaging noted an apple core lesion within the transverse colon concerning for malignancy and he underwent a colonoscopy earlier today which confirmed a large, near obstructing mass within the transverse colon.  Patient has been seen by Dr. Constance Haw with tentative plan for surgical management on Monday.  Patient has been experiencing intermittent dyspnea on exertion over the past few months although in the setting of anemia, he has received packed red cell transfusions as an outpatient under the direction of his PCP.  At this point he states that he cannot walk up his stairs from the basement without stopping due to shortness of breath.  He does not describe angina symptoms, no definite sense of palpitations.  He has not required AV nodal blockers as an outpatient for rate control of atrial fibrillation.  His current hemoglobin is 9.6.  Echocardiogram done through [facility] in December 2019 revealed LVEF 70 to 75%, mild left atrial enlargement, mild mitral annular calcification with trace mitral regurgitation, no significant aortic valve disease, mild tricuspid  regurgitation with normal RVSP, no significant pericardial effusion.  Patient has no known history of ischemic heart disease although has not undergone any objective testing.  He does have a history of type 2 diabetes mellitus and previous lacunar infarct.  Past Medical History:  Diagnosis Date  . Anemia    Causing interruption in anticoagulation  . Atrial fibrillation and flutter ([place])   . BPH (benign prostatic hypertrophy)   . Colon cancer ([place])    Status post LAR 2008  . Hypothyroidism   . Lacunar infarction ([place]) 11/2012   RT 3rd NERVE PALSY, SB [name]  . PE (pulmonary embolism)    Temporarily on Eliquis 2017  . Type 2 diabetes mellitus ([place])     Past Surgical History:  Procedure Laterality Date  . ABDOMINOPERINEAL PROCTOCOLECTOMY  2009   end colostomy   . COLECTOMY  2008  . COLOSTOMY    . EXPLORATORY LAPAROTOMY  2017   ? pneumoperitoneum of unknown etiology/ negative Ex lap  . LAPAROSCOPIC CHOLECYSTECTOMY  2007  . SBO SURGERY  06/2009     Inpatient Medications: Scheduled Meds: . insulin aspart  0-15 Units Subcutaneous TID WC  . levothyroxine  50 mcg Oral QAC breakfast  . sodium chloride flush  3 mL Intravenous Q12H  . tamsulosin  0.4 mg Oral QHS   Continuous Infusions: . sodium chloride     PRN Meds: sodium chloride, acetaminophen **OR** acetaminophen, ondansetron **OR** ondansetron (ZOFRAN) IV, sodium chloride flush  Allergies:   No Known Allergies  Social History:   Social History   Socioeconomic History  . Marital status: Married    Spouse name: Not on file  . Number of children:  Not on file  . Years of education: Not on file  . Highest education level: Not on file  Occupational History  . Not on file  Social Needs  . Financial resource strain: Not on file  . Food insecurity:    Worry: Not on file    Inability: Not on file  . Transportation needs:    Medical: Not on file    Non-medical: Not on file  Tobacco Use  . Smoking status: Former  Smoker    Packs/day: 1.00    Years: 27.00    Pack years: 27.00    Types: Cigarettes    Start date: 01/27/1964    Last attempt to quit: 01/27/1991    Years since quitting: 27.5  . Smokeless tobacco: Former Systems developer    Types: [place] date: 08/10/1995  Substance and Sexual Activity  . Alcohol use: Never    Alcohol/week: 0.0 standard drinks    Frequency: Never  . Drug use: Never  . Sexual activity: Not on file  Lifestyle  . Physical activity:    Days per week: Not on file    Minutes per session: Not on file  . Stress: Not on file  Relationships  . Social connections:    Talks on phone: Not on file    Gets together: Not on file    Attends religious service: Not on file    Active member of club or organization: Not on file    Attends meetings of clubs or organizations: Not on file    Relationship status: Not on file  . Intimate partner violence:    Fear of current or ex partner: Not on file    Emotionally abused: Not on file    Physically abused: Not on file    Forced sexual activity: Not on file  Other Topics Concern  . Not on file  Social History Narrative  . Not on file    Family History:   The patient's family history includes Heart attack in his father; Heart disease in his father; Heart failure in his father.  ROS:  Please see the history of present illness.  None.  All other ROS reviewed and negative.     Physical Exam/Data:   Vitals:   08/25/18 1400 08/25/18 1415 08/25/18 1430 08/25/18 1440  BP: 137/77 121/72  (!) 143/94  Pulse: 85 88 86 78  Resp: 19 (!) 22 20 18   Temp:    98.9 F (37.2 C)  TempSrc:    Oral  SpO2: 99% 97% 96% 100%  Weight:      Height:        Intake/Output Summary (Last 24 hours) at 08/25/2018 1628 Last data filed at 08/25/2018 1202 Gross per 24 hour  Intake 100 ml  Output -  Net 100 ml   Filed Weights   08/25/18 1037  Weight: 87.9 kg   Body mass index is 26.28 kg/m.   Gen: Elderly male, no acute distress. HEENT: Conjunctiva  and lids normal, oropharynx clear. Neck: Supple, no elevated JVP or carotid bruits, no thyromegaly. Lungs: Clear to auscultation, nonlabored breathing at rest. Cardiac: Irregularly irregular, no S3, soft systolic murmur. 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 Extremities: No pitting edema, distal pulses 2+. Skin: Warm and dry. Musculoskeletal: No kyphosis. Neuropsychiatric: Alert and oriented x3, affect grossly appropriate.  EKG:  I personally reviewed the tracing from 08/21/2018 which showed rate controlled atrial fibrillation with leftward axis, R' in V1 and  V2, nonspecific T wave changes, low voltage.  Telemetry: Currently not on telemetry.  Relevant CV Studies:  Echocardiogram through [facility] from December 2019 as summarized above.  Laboratory Data:  Chemistry Recent Labs  Lab 08/21/18 0853  NA 136  K 3.9  CL 105  CO2 24  GLUCOSE 147*  BUN 12  CREATININE 0.80  CALCIUM 8.8*  GFRNONAA >60  GFRAA >60  ANIONGAP 7    Recent Labs  Lab 08/21/18 0853  PROT 6.8  ALBUMIN 3.2*  AST 22  ALT 15  ALKPHOS 69  BILITOT 0.5   Hematology Recent Labs  Lab 08/21/18 0853 08/25/18 1553  WBC 7.7 6.6  RBC 3.76* 3.64*  HGB 9.7* 9.6*  HCT 33.2* 32.7*  MCV 88.3 89.8  MCH 25.8* 26.4  MCHC 29.2* 29.4*  RDW 14.1 14.2  PLT 237 258   Radiology/Studies:  Ct Chest W Contrast  Result Date: 08/25/2018 CLINICAL DATA:  Recurrent colon carcinoma. Treated with chemotherapy and radiation therapy 10 years ago. Scheduled for surgery. EXAM: CT CHEST WITH CONTRAST TECHNIQUE: Multidetector CT imaging of the chest was performed during intravenous contrast administration. CONTRAST:  79mL OMNIPAQUE IOHEXOL 300 MG/ML  SOLN COMPARISON:  Chest radiographs, 06/21/2018 FINDINGS: Cardiovascular: Heart is normal in size. No pericardial effusion. Three-vessel coronary artery calcifications. Great vessels are normal in caliber. Minor aortic  atherosclerosis. Mediastinum/Nodes: No enlarged mediastinal, hilar, or axillary lymph nodes. Thyroid gland, trachea, and esophagus demonstrate no significant findings. Lungs/Pleura: Small nodules are noted along the diaphragmatic surface of the right lung base, adjacent to the oblique fissure, largest measuring 5 mm. There are multiple small calcified nodules consistent with healed granuloma most evident in the peripheral right upper lobe. There are scattered areas of peripheral reticular opacities consistent with scarring. No evidence of pneumonia or pulmonary edema. No pleural effusion or pneumothorax. Upper Abdomen: Fat herniates through the esophageal hiatus into the posterior inferior mediastinum. No visualized liver or adrenal masses. There is thickening of the transverse colon that is partly imaged, consistent with the given history of colon carcinoma. No acute findings in the upper abdomen. Musculoskeletal: No acute fracture. No osteoblastic or osteolytic lesions. IMPRESSION: 1. No acute findings. 2. Small pleural based nodules the base of the right lung, largest measuring 5 mm. These are stable compared to the lung bases from an abdomen and pelvis CT dated 10/17/2017 and relatively stable from a CT dated 02/22/2013, therefore benign. 3. 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 4. Coronary artery and aortic atherosclerosis. Aortic Atherosclerosis (ICD10-I70.0). Electronically Signed   By: Lajean Manes M.D.   On: 08/25/2018 16:04    Assessment and Plan:   1.  Preoperative cardiac evaluation in a 78 year old male with a history of type 2 diabetes mellitus, previous lacunar infarct, and progressive dyspnea on exertion although in the setting of anemia with recently documented GI bleed and a transverse colonic, near obstructing mass consistent with malignancy.  Echocardiogram from December 2019 revealed LVEF 70 to 75% without significant  valvular abnormalities.  He has no known history of chronic lung disease, stable nodules/granulomas by CT imaging are evident.  He also has 3 vessel distribution coronary artery calcification by CT imaging.  He has not undergone any objective ischemic testing.  Tentative plan is for him to undergo colon surgery under general anesthesia early next week.  2.  Permanent atrial fibrillation, not anticoagulated with history of recurring anemia and recent GI bleed.  He was previously on  Eliquis during diagnosis of pulmonary embolus in 2017, but has not been anticoagulated for the last few years.  He has been on aspirin at home. CHADSVASC score is 5.  Heart rate is controlled without use of AV nodal blockers.  I reviewed his records including recent work-up, also discussed the case with Dr. Roderic Palau, patient and family members present.  Would place him on telemetry to get a better sense of heart rate control in atrial fibrillation as he is not on any AV nodal blockers at this time.  We will schedule a Lexiscan Myoview for Monday to objectively assess ischemic burden.  RCRI cardiac risk calculator indicates class II perioperative risk at 6.6% for major event, although if he does have significant ischemic heart disease this would further increase his risk.  Main scenario is to exclude a high risk Myoview result, in that case it may be best to have him transferred to [facility] for surgical intervention at that facility.  Pursuing a cardiac catheterization at this time however would only serve to define coronary anatomy as he would not be a candidate for PCI and dual antiplatelet therapy under the present circumstances.  Signed, Rozann Lesches, MD  08/25/2018 4:28 PM

## 2018-08-25 NOTE — Anesthesia Postprocedure Evaluation (Signed)
Anesthesia Post Note  Patient: Jose Lawrence  Procedure(s) Performed: COLONOSCOPY WITH PROPOFOL (N/A ) BIOPSY  Patient location during evaluation: PACU Anesthesia Type: General Level of consciousness: awake and alert and patient cooperative Pain management: satisfactory to patient Vital Signs Assessment: post-procedure vital signs reviewed and stable Cardiovascular status: stable Postop Assessment: no apparent nausea or vomiting Anesthetic complications: no     Last Vitals:  Vitals:   08/25/18 1102  BP: (!) 137/98  Pulse: 98  Resp: (!) 24  Temp: 36.8 C  SpO2: 98%    Last Pain:  Vitals:   08/25/18 1155  TempSrc:   PainSc: 0-No pain                 Amillia Biffle

## 2018-08-25 NOTE — Anesthesia Preprocedure Evaluation (Addendum)
Anesthesia Evaluation  Patient identified by MRN, date of birth, ID band Patient awake    Reviewed: Allergy & Precautions, NPO status , Patient's Chart, lab work & pertinent test results  Airway Mallampati: II  TM Distance: >3 FB Neck ROM: Full    Dental no notable dental hx. (+) Teeth Intact   Pulmonary neg pulmonary ROS, former smoker,    Pulmonary exam normal breath sounds clear to auscultation       Cardiovascular Exercise Tolerance: Good negative cardio ROS Normal cardiovascular examI Rhythm:Regular Rate:Normal  States gardens and cuts wood -but recently decreasing ET Has been receiving prbcs x3 - last time 2 units ~2 weeks ago    Neuro/Psych Per chart h/o lacunar infarct with CN 3,5 issues  States uses an eye patch  CVA negative psych ROS   GI/Hepatic negative GI ROS, Neg liver ROS, Colon mass and anemia  H/o Colon Ca in the past - now diverted    Endo/Other  diabetes, Well Controlled, Type 2Hypothyroidism   Renal/GU negative Renal ROS  negative genitourinary   Musculoskeletal negative musculoskeletal ROS (+)   Abdominal   Peds negative pediatric ROS (+)  Hematology negative hematology ROS (+)   Anesthesia Other Findings   Reproductive/Obstetrics negative OB ROS                            Anesthesia Physical Anesthesia Plan  ASA: III  Anesthesia Plan: General   Post-op Pain Management:    Induction: Intravenous  PONV Risk Score and Plan:   Airway Management Planned: Nasal Cannula and Simple Face Mask  Additional Equipment:   Intra-op Plan:   Post-operative Plan:   Informed Consent: I have reviewed the patients History and Physical, chart, labs and discussed the procedure including the risks, benefits and alternatives for the proposed anesthesia with the patient or authorized representative who has indicated his/her understanding and acceptance.     Dental  advisory given  Plan Discussed with: CRNA  Anesthesia Plan Comments:         Anesthesia Quick Evaluation

## 2018-08-25 NOTE — Transfer of Care (Signed)
Immediate Anesthesia Transfer of Care Note  Patient: Jose Lawrence  Procedure(s) Performed: COLONOSCOPY WITH PROPOFOL (N/A )  Patient Location: PACU  Anesthesia Type:General  Level of Consciousness: awake, alert  and patient cooperative  Airway & Oxygen Therapy: Patient Spontanous Breathing  Post-op Assessment: Report given to RN and Post -op Vital signs reviewed and stable  Post vital signs: Reviewed and stable  Last Vitals:  Vitals Value Taken Time  BP    Temp    Pulse 99 08/25/2018 12:25 PM  Resp 17 08/25/2018 12:25 PM  SpO2 100 % 08/25/2018 12:25 PM  Vitals shown include unvalidated device data.  Last Pain:  Vitals:   08/25/18 1155  TempSrc:   PainSc: 0-No pain         Complications: No apparent anesthesia complications

## 2018-08-25 NOTE — Progress Notes (Signed)
[facility]  Colon cancer, second primary. Spoke with Dr. Laural Golden. Has been bleeding. Coming into hospitalist service. Will see tomorrow as had sedation today for colonoscopy.   Plan for OR Monday with Ex lap, colectomy with end ileostomy.  Will discuss with patient and family tomorrow.  Curlene Labrum, MD [facility] [address] [place], [place] [address] [phone] (office)

## 2018-08-26 LAB — CBC
HCT: 33.6 % — ABNORMAL LOW (ref 39.0–52.0)
Hemoglobin: 9.9 g/dL — ABNORMAL LOW (ref 13.0–17.0)
MCH: 26.2 pg (ref 26.0–34.0)
MCHC: 29.5 g/dL — ABNORMAL LOW (ref 30.0–36.0)
MCV: 88.9 fL (ref 80.0–100.0)
Platelets: 270 10*3/uL (ref 150–400)
RBC: 3.78 MIL/uL — ABNORMAL LOW (ref 4.22–5.81)
RDW: 14.4 % (ref 11.5–15.5)
WBC: 6.8 10*3/uL (ref 4.0–10.5)
nRBC: 0 % (ref 0.0–0.2)

## 2018-08-26 LAB — BASIC METABOLIC PANEL
Anion gap: 11 (ref 5–15)
BUN: 10 mg/dL (ref 8–23)
CHLORIDE: 103 mmol/L (ref 98–111)
CO2: 21 mmol/L — ABNORMAL LOW (ref 22–32)
CREATININE: 0.92 mg/dL (ref 0.61–1.24)
Calcium: 8.7 mg/dL — ABNORMAL LOW (ref 8.9–10.3)
GFR calc Af Amer: >60 mL/min (ref >60)
GFR calc non Af Amer: >60 mL/min (ref >60)
Glucose, Bld: 176 mg/dL — ABNORMAL HIGH (ref 70–99)
Potassium: 3.8 mmol/L (ref 3.5–5.1)
SODIUM: 135 mmol/L (ref 135–145)

## 2018-08-26 LAB — GLUCOSE, CAPILLARY
Glucose-Capillary: 237 mg/dL — ABNORMAL HIGH (ref 70–99)
Glucose-Capillary: 216 mg/dL — ABNORMAL HIGH (ref 70–99)
Glucose-Capillary: 255 mg/dL — ABNORMAL HIGH (ref 70–99)

## 2018-08-26 LAB — CEA: CEA1: 2 ng/mL (ref 0.0–4.7)

## 2018-08-26 NOTE — Progress Notes (Signed)
[facility] Progress Note  1 Day Post-Op  Subjective: Discussed with patient and his family the colon cancer and plans for surgery. Discussed need for completion colectomy based on the location of the cancer in the transverse colon as well as a new primary cancer. Discussed the issues with ileostomy and dehydration and the challenges we will face. Discussed that we will be getting a stress test on Monday and that he will have surgery on Tuesday pending no issues.   He is doing well. Tolerating diet.   Objective: Vital signs in last 24 hours: Temp:  [98.2 F (36.8 C)-98.9 F (37.2 C)] 98.2 F (36.8 C) (01/18 0526) Pulse Rate:  [75-91] 83 (01/18 0526) Resp:  [17-24] 20 (01/18 0526) BP: (90-145)/(64-94) 90/64 (01/18 0526) SpO2:  [83 %-100 %] 83 % (01/18 0526)    Intake/Output from previous day: 01/17 0701 - 01/18 0700 In: 100 [I.V.:100] Out: -  Intake/Output this shift: Total I/O In: 240 [P.O.:240] Out: 300 [Urine:300]  General appearance: alert, cooperative and no distress Resp: normal work breathing GI: soft, nondistended, nontender, parastomal hernia, ostomy pink  Lab Results:  Recent Labs    08/25/18 1553 08/26/18 0643  WBC 6.6 6.8  HGB 9.6* 9.9*  HCT 32.7* 33.6*  PLT 258 270   BMET Recent Labs    08/25/18 1553 08/26/18 0643  NA 135 135  K 3.9 3.8  CL 102 103  CO2 24 21*  GLUCOSE 158* 176*  BUN 11 10  CREATININE 0.83 0.92  CALCIUM 8.7* 8.7*   PT/INR Recent Labs    08/25/18 1553  LABPROT 14.3  INR 1.12   Personally reviewed-  No metastatic disease on CT chest  Studies/Results: Ct Chest W Contrast  Result Date: 08/25/2018 CLINICAL DATA:  Recurrent colon carcinoma. Treated with chemotherapy and radiation therapy 10 years ago. Scheduled for surgery. EXAM: CT CHEST WITH CONTRAST TECHNIQUE: Multidetector CT imaging of the chest was performed during intravenous contrast administration. CONTRAST:  63mL OMNIPAQUE IOHEXOL 300 MG/ML  SOLN  COMPARISON:  Chest radiographs, 06/21/2018 FINDINGS: Cardiovascular: Heart is normal in size. No pericardial effusion. Three-vessel coronary artery calcifications. Great vessels are normal in caliber. Minor aortic atherosclerosis. Mediastinum/Nodes: No enlarged mediastinal, hilar, or axillary lymph nodes. Thyroid gland, trachea, and esophagus demonstrate no significant findings. Lungs/Pleura: Small nodules are noted along the diaphragmatic surface of the right lung base, adjacent to the oblique fissure, largest measuring 5 mm. There are multiple small calcified nodules consistent with healed granuloma most evident in the peripheral right upper lobe. There are scattered areas of peripheral reticular opacities consistent with scarring. No evidence of pneumonia or pulmonary edema. No pleural effusion or pneumothorax. Upper Abdomen: Fat herniates through the esophageal hiatus into the posterior inferior mediastinum. No visualized liver or adrenal masses. There is thickening of the transverse colon that is partly imaged, consistent with the given history of colon carcinoma. No acute findings in the upper abdomen. Musculoskeletal: No acute fracture. No osteoblastic or osteolytic lesions. IMPRESSION: 1. No acute findings. 2. Small pleural based nodules the base of the right lung, largest measuring 5 mm. These are stable compared to the lung bases from an abdomen and pelvis CT dated 10/17/2017 and relatively stable from a CT dated 02/22/2013, therefore benign. 3. 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 4. Coronary artery and aortic atherosclerosis. Aortic Atherosclerosis (ICD10-I70.0). Electronically Signed   By: Lajean Manes M.D.   On: 08/25/2018 16:04    Assessment/Plan: Jose Lawrence is a 78 yo with a new colon cancer who is getting a stress test on Monday. He is doing fair now. We have discussed surgery and the plan for Tuesday pending no issues on  Monday with the stress test.  -Soft diet for now -Will see to make sure no more questions  -Cardiology Stress test Monday    LOS: 1 day    [name] 08/26/2018

## 2018-08-26 NOTE — Progress Notes (Signed)
PROGRESS NOTE    Jose Lawrence  NLG:[id number] DOB: Dec 26, 1940 DOA: 08/25/2018 PCP: Glenda Chroman, MD    Brief Narrative:  78 year old male with a history of colon cancer status post resection and colostomy, with undergoing colonoscopy for a 2 to 3-week history of dark-colored stool.  He was found to have a partially obstructing lesion in his transverse colon which was a source of dark stools.  He was admitted to the hospital and seen by general surgery with plans for resection.  Cardiology is also evaluated the patient for preoperative evaluation and plans on stress test on 1/20 in order to risk stratify him.   Assessment & Plan:   Principal Problem:   Mass of colon Active Problems:   Iron deficiency anemia due to chronic blood loss   Hypothyroidism   HTN (hypertension)   DM (diabetes mellitus), type 2 (HCC)   Colostomy in place [facility])   BPH (benign prostatic hyperplasia)   Atrial fibrillation, chronic   1. Colon mass.  Seen by general surgery with plans for total colectomy and ileostomy.  Currently on clear liquids.  CT scan of the chest did not show any clear evidence of metastatic disease.  Seen by cardiology for preoperative work-up and with his issues with recent shortness of breath, stress test has been ordered for 1/20. 2. Anemia secondary to chronic blood loss.  Hemoglobin has been stable since coming to the hospital.  Continue to follow. 3. Diabetes.  Holding oral agents.  Blood sugars have been stable on sliding scale insulin. 4. Hypertension.  Chronically on valsartan.  Currently on hold in the setting of bleeding. 5. BPH.  Continue on Flomax. 6. Hypothyroidism.  Continue on Synthroid. 7. Chronic atrial fibrillation.  Heart rate is currently controlled.  He was not on anticoagulation in the past due to issues with anemia.  He is chronically on aspirin which is currently on hold due to bleeding issues.   DVT prophylaxis: SCDs Code Status: Full code Family  Communication: Discussed with multiple family members at bedside Disposition Plan: Discharge home after surgery   Consultants:   General surgery  Cardiology  Gastroenterology  Procedures:  Colonoscopy:- Widely patent end colostomy with healthy                            appearing mucosa in the descending colon.                           - Malignant partially obstructing tumor in the mid                            transverse colon with active bleeding. Biopsied.                           - The descending colon, proximal transverse colon,                            hepatic flexure, ascending colon and cecum are                             normal.  Antimicrobials:       Subjective: No chest pain or shortness of breath.  No vomiting.  Tolerating clear liquids.  Objective: Vitals:  08/25/18 1440 08/25/18 2201 08/26/18 0526 08/26/18 1458  BP: (!) 143/94 118/79 90/64 122/89  Pulse: 78 79 83 85  Resp: 18 20 20 18   Temp: 98.9 F (37.2 C) 98.4 F (36.9 C) 98.2 F (36.8 C) 98 F (36.7 C)  TempSrc: Oral Oral Oral Oral  SpO2: 100% 94% (!) 83% 100%  Weight:      Height:        Intake/Output Summary (Last 24 hours) at 08/26/2018 1822 Last data filed at 08/26/2018 1741 Gross per 24 hour  Intake 1200 ml  Output 1300 ml  Net -100 ml   Filed Weights   08/25/18 1037  Weight: 87.9 kg    Examination:  General exam: Appears calm and comfortable  Respiratory system: Clear to auscultation. Respiratory effort normal. Cardiovascular system: Irregularly irregular. No JVD, murmurs, rubs, gallops or clicks. No pedal edema. Gastrointestinal system: Abdomen is nondistended, soft and nontender. No organomegaly or masses felt. Normal bowel sounds heard.  Colostomy in left lower quadrant with empty bag Central nervous system: Alert and oriented. No focal neurological deficits. Extremities: Symmetric 5 x 5 power. Skin: No rashes, lesions or ulcers Psychiatry: Judgement and insight  appear normal. Mood & affect appropriate.     Data Reviewed: I have personally reviewed following labs and imaging studies  CBC: Recent Labs  Lab 08/21/18 0853 08/25/18 1553 08/26/18 0643  WBC 7.7 6.6 6.8  NEUTROABS 5.3  --   --   HGB 9.7* 9.6* 9.9*  HCT 33.2* 32.7* 33.6*  MCV 88.3 89.8 88.9  PLT 237 258 656   Basic Metabolic Panel: Recent Labs  Lab 08/21/18 0853 08/25/18 1553 08/26/18 0643  NA 136 135 135  K 3.9 3.9 3.8  CL 105 102 103  CO2 24 24 21*  GLUCOSE 147* 158* 176*  BUN 12 11 10   CREATININE 0.80 0.83 0.92  CALCIUM 8.8* 8.7* 8.7*   GFR: Estimated Creatinine Clearance: 76.1 mL/min (by C-G formula based on SCr of 0.92 mg/dL). Liver Function Tests: Recent Labs  Lab 08/21/18 0853 08/25/18 1553  AST 22 21  ALT 15 15  ALKPHOS 69 74  BILITOT 0.5 0.6  PROT 6.8 6.8  ALBUMIN 3.2* 3.2*   Recent Labs  Lab 08/21/18 0853  LIPASE 23   No results for input(s): AMMONIA in the last 168 hours. Coagulation Profile: Recent Labs  Lab 08/25/18 1553  INR 1.12   Cardiac Enzymes: No results for input(s): CKTOTAL, CKMB, CKMBINDEX, TROPONINI in the last 168 hours. BNP (last 3 results) No results for input(s): PROBNP in the last 8760 hours. HbA1C: No results for input(s): HGBA1C in the last 72 hours. CBG: Recent Labs  Lab 08/25/18 1042 08/25/18 1229 08/25/18 2117 08/26/18 1129 08/26/18 1621  GLUCAP 167* 152* 158* 237* 216*   Lipid Profile: No results for input(s): CHOL, HDL, LDLCALC, TRIG, CHOLHDL, LDLDIRECT in the last 72 hours. Thyroid Function Tests: No results for input(s): TSH, T4TOTAL, FREET4, T3FREE, THYROIDAB in the last 72 hours. Anemia Panel: No results for input(s): VITAMINB12, FOLATE, FERRITIN, TIBC, IRON, RETICCTPCT in the last 72 hours. Sepsis Labs: No results for input(s): PROCALCITON, LATICACIDVEN in the last 168 hours.  No results found for this or any previous visit (from the past 240 hour(s)).       Radiology Studies: Ct Chest  W Contrast  Result Date: 08/25/2018 CLINICAL DATA:  Recurrent colon carcinoma. Treated with chemotherapy and radiation therapy 10 years ago. Scheduled for surgery. EXAM: CT CHEST WITH CONTRAST TECHNIQUE: Multidetector CT imaging of  the chest was performed during intravenous contrast administration. CONTRAST:  6mL OMNIPAQUE IOHEXOL 300 MG/ML  SOLN COMPARISON:  Chest radiographs, 06/21/2018 FINDINGS: Cardiovascular: Heart is normal in size. No pericardial effusion. Three-vessel coronary artery calcifications. Great vessels are normal in caliber. Minor aortic atherosclerosis. Mediastinum/Nodes: No enlarged mediastinal, hilar, or axillary lymph nodes. Thyroid gland, trachea, and esophagus demonstrate no significant findings. Lungs/Pleura: Small nodules are noted along the diaphragmatic surface of the right lung base, adjacent to the oblique fissure, largest measuring 5 mm. There are multiple small calcified nodules consistent with healed granuloma most evident in the peripheral right upper lobe. There are scattered areas of peripheral reticular opacities consistent with scarring. No evidence of pneumonia or pulmonary edema. No pleural effusion or pneumothorax. Upper Abdomen: Fat herniates through the esophageal hiatus into the posterior inferior mediastinum. No visualized liver or adrenal masses. There is thickening of the transverse colon that is partly imaged, consistent with the given history of colon carcinoma. No acute findings in the upper abdomen. Musculoskeletal: No acute fracture. No osteoblastic or osteolytic lesions. IMPRESSION: 1. No acute findings. 2. Small pleural based nodules the base of the right lung, largest measuring 5 mm. These are stable compared to the lung bases from an abdomen and pelvis CT dated 10/17/2017 and relatively stable from a CT dated 02/22/2013, therefore benign. 3. 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 4. Coronary  artery and aortic atherosclerosis. Aortic Atherosclerosis (ICD10-I70.0). Electronically Signed   By: Lajean Manes M.D.   On: 08/25/2018 16:04        Scheduled Meds: . insulin aspart  0-15 Units Subcutaneous TID WC  . levothyroxine  50 mcg Oral QAC breakfast  . sodium chloride flush  3 mL Intravenous Q12H  . tamsulosin  0.4 mg Oral QHS   Continuous Infusions: . sodium chloride       LOS: 1 day    Time spent: 71mins    Kathie Dike, MD [facility]   If 7PM-7AM, please contact night-coverage [email or web address]  08/26/2018, 6:22 PM

## 2018-08-27 LAB — CBC
HEMATOCRIT: 33.5 % — AB (ref 39.0–52.0)
Hemoglobin: 9.7 g/dL — ABNORMAL LOW (ref 13.0–17.0)
MCH: 25.7 pg — ABNORMAL LOW (ref 26.0–34.0)
MCHC: 29 g/dL — ABNORMAL LOW (ref 30.0–36.0)
MCV: 88.6 fL (ref 80.0–100.0)
Platelets: 250 10*3/uL (ref 150–400)
RBC: 3.78 MIL/uL — ABNORMAL LOW (ref 4.22–5.81)
RDW: 14.1 % (ref 11.5–15.5)
WBC: 7.3 10*3/uL (ref 4.0–10.5)
nRBC: 0 % (ref 0.0–0.2)

## 2018-08-27 LAB — BASIC METABOLIC PANEL
Anion gap: 9 (ref 5–15)
BUN: 10 mg/dL (ref 8–23)
CO2: 24 mmol/L (ref 22–32)
Calcium: 8.5 mg/dL — ABNORMAL LOW (ref 8.9–10.3)
Chloride: 99 mmol/L (ref 98–111)
Creatinine, Ser: 1.21 mg/dL (ref 0.61–1.24)
GFR calc non Af Amer: 58 mL/min — ABNORMAL LOW (ref >60)
Glucose, Bld: 345 mg/dL — ABNORMAL HIGH (ref 70–99)
Potassium: 4 mmol/L (ref 3.5–5.1)
Sodium: 132 mmol/L — ABNORMAL LOW (ref 135–145)

## 2018-08-27 LAB — GLUCOSE, CAPILLARY
Glucose-Capillary: 197 mg/dL — ABNORMAL HIGH (ref 70–99)
Glucose-Capillary: 285 mg/dL — ABNORMAL HIGH (ref 70–99)
Glucose-Capillary: 311 mg/dL — ABNORMAL HIGH (ref 70–99)
Glucose-Capillary: 254 mg/dL — ABNORMAL HIGH (ref 70–99)
Glucose-Capillary: 159 mg/dL — ABNORMAL HIGH (ref 70–99)

## 2018-08-27 MED ORDER — INSULIN ASPART 100 UNIT/ML ~~LOC~~ SOLN
0.0000 [IU] | Freq: Every day | SUBCUTANEOUS | Status: DC
  Administered 2018-08-29: 4 [IU] via SUBCUTANEOUS

## 2018-08-27 MED ORDER — INSULIN ASPART 100 UNIT/ML ~~LOC~~ SOLN
0.0000 [IU] | Freq: Three times a day (TID) | SUBCUTANEOUS | Status: DC
  Administered 2018-08-27: 11 [IU] via SUBCUTANEOUS
  Administered 2018-08-28: 7 [IU] via SUBCUTANEOUS
  Administered 2018-08-28 – 2018-08-29 (×3): 4 [IU] via SUBCUTANEOUS
  Administered 2018-08-30 (×2): 11 [IU] via SUBCUTANEOUS

## 2018-08-27 MED ORDER — METOPROLOL TARTRATE 25 MG PO TABS
25.0000 mg | ORAL_TABLET | Freq: Two times a day (BID) | ORAL | Status: DC
  Administered 2018-08-27 – 2018-08-29 (×5): 25 mg via ORAL
  Filled 2018-08-27 (×5): qty 1

## 2018-08-27 NOTE — Progress Notes (Signed)
PROGRESS NOTE    Jose Lawrence  NUU:[id number] DOB: April 17, 1941 DOA: 08/25/2018 PCP: Glenda Chroman, MD    Brief Narrative:  78 year old male with a history of colon cancer status post resection and colostomy, with undergoing colonoscopy for a 2 to 3-week history of dark-colored stool.  He was found to have a partially obstructing lesion in his transverse colon which was a source of dark stools.  He was admitted to the hospital and seen by general surgery with plans for resection.  Cardiology is also evaluated the patient for preoperative evaluation and plans on stress test on 1/20 in order to risk stratify him.   Assessment & Plan:   Principal Problem:   Mass of colon Active Problems:   Iron deficiency anemia due to chronic blood loss   Hypothyroidism   HTN (hypertension)   DM (diabetes mellitus), type 2 (HCC)   Colostomy in place [facility])   BPH (benign prostatic hyperplasia)   Atrial fibrillation, chronic   1. Colon mass.  Seen by general surgery with plans for total colectomy and ileostomy.  Currently on soft diet.  CT scan of the chest did not show any clear evidence of metastatic disease.  Seen by cardiology for preoperative work-up and with his issues with recent shortness of breath, stress test has been ordered for 1/20. 2. Anemia secondary to chronic blood loss.  Hemoglobin has been stable since coming to the hospital.  Continue to follow. 3. Diabetes.  Holding oral agents.  Blood sugars have trended up since diet is being advanced.  Continue on sliding scale insulin.  Will be n.p.o. after midnight. 4. Hypertension.  Chronically on valsartan.  Currently on hold in the setting of bleeding.  Blood pressures been stable 5. BPH.  Continue on Flomax. 6. Hypothyroidism.  Continue on Synthroid. 7. Chronic atrial fibrillation.  Having periods of tachycardia overnight.  Will start on low-dose beta-blockers..  He was not on anticoagulation in the past due to issues with anemia.  He is  chronically on aspirin which is currently on hold due to bleeding issues.   DVT prophylaxis: SCDs Code Status: Full code Family Communication: Discussed with wife at bedside Disposition Plan: Discharge home after surgery   Consultants:   General surgery  Cardiology  Gastroenterology  Procedures:  Colonoscopy:- Widely patent end colostomy with healthy                            appearing mucosa in the descending colon.                           - Malignant partially obstructing tumor in the mid                            transverse colon with active bleeding. Biopsied.                           - The descending colon, proximal transverse colon,                            hepatic flexure, ascending colon and cecum are                             normal.  Antimicrobials:  Subjective: Feeling some palpitations overnight.  No vomiting.  No abdominal pain.  Objective: Vitals:   08/26/18 0526 08/26/18 1458 08/26/18 2144 08/27/18 0611  BP: 90/64 122/89 118/77 109/66  Pulse: 83 85 79 83  Resp: 20 18 18 18   Temp: 98.2 F (36.8 C) 98 F (36.7 C) 98.4 F (36.9 C) 98.4 F (36.9 C)  TempSrc: Oral Oral Oral Oral  SpO2: (!) 83% 100% 98% 96%  Weight:      Height:        Intake/Output Summary (Last 24 hours) at 08/27/2018 1304 Last data filed at 08/27/2018 0926 Gross per 24 hour  Intake 720 ml  Output 1100 ml  Net -380 ml   Filed Weights   08/25/18 1037  Weight: 87.9 kg    Examination:  General exam: Alert, awake, oriented x 3 Respiratory system: Clear to auscultation. Respiratory effort normal. Cardiovascular system: Irregularly irregular. No murmurs, rubs, gallops. Gastrointestinal system: Abdomen is nondistended, soft and nontender. No organomegaly or masses felt. Normal bowel sounds heard.  Colostomy left lower quadrant Central nervous system: Alert and oriented. No focal neurological deficits. Extremities: No C/C/E, +pedal pulses Skin: No rashes, lesions  or ulcers Psychiatry: Judgement and insight appear normal. Mood & affect appropriate.    Data Reviewed: I have personally reviewed following labs and imaging studies  CBC: Recent Labs  Lab 08/21/18 0853 08/25/18 1553 08/26/18 0643 08/27/18 1046  WBC 7.7 6.6 6.8 7.3  NEUTROABS 5.3  --   --   --   HGB 9.7* 9.6* 9.9* 9.7*  HCT 33.2* 32.7* 33.6* 33.5*  MCV 88.3 89.8 88.9 88.6  PLT 237 258 270 355   Basic Metabolic Panel: Recent Labs  Lab 08/21/18 0853 08/25/18 1553 08/26/18 0643 08/27/18 1046  NA 136 135 135 132*  K 3.9 3.9 3.8 4.0  CL 105 102 103 99  CO2 24 24 21* 24  GLUCOSE 147* 158* 176* 345*  BUN 12 11 10 10   CREATININE 0.80 0.83 0.92 1.21  CALCIUM 8.8* 8.7* 8.7* 8.5*   GFR: Estimated Creatinine Clearance: 57.9 mL/min (by C-G formula based on SCr of 1.21 mg/dL). Liver Function Tests: Recent Labs  Lab 08/21/18 0853 08/25/18 1553  AST 22 21  ALT 15 15  ALKPHOS 69 74  BILITOT 0.5 0.6  PROT 6.8 6.8  ALBUMIN 3.2* 3.2*   Recent Labs  Lab 08/21/18 0853  LIPASE 23   No results for input(s): AMMONIA in the last 168 hours. Coagulation Profile: Recent Labs  Lab 08/25/18 1553  INR 1.12   Cardiac Enzymes: No results for input(s): CKTOTAL, CKMB, CKMBINDEX, TROPONINI in the last 168 hours. BNP (last 3 results) No results for input(s): PROBNP in the last 8760 hours. HbA1C: No results for input(s): HGBA1C in the last 72 hours. CBG: Recent Labs  Lab 08/26/18 1129 08/26/18 1621 08/26/18 2138 08/27/18 0735 08/27/18 1133  GLUCAP 237* 216* 255* 197* 285*   Lipid Profile: No results for input(s): CHOL, HDL, LDLCALC, TRIG, CHOLHDL, LDLDIRECT in the last 72 hours. Thyroid Function Tests: No results for input(s): TSH, T4TOTAL, FREET4, T3FREE, THYROIDAB in the last 72 hours. Anemia Panel: No results for input(s): VITAMINB12, FOLATE, FERRITIN, TIBC, IRON, RETICCTPCT in the last 72 hours. Sepsis Labs: No results for input(s): PROCALCITON, LATICACIDVEN in the  last 168 hours.  No results found for this or any previous visit (from the past 240 hour(s)).       Radiology Studies: Ct Chest W Contrast  Result Date: 08/25/2018 CLINICAL DATA:  Recurrent  colon carcinoma. Treated with chemotherapy and radiation therapy 10 years ago. Scheduled for surgery. EXAM: CT CHEST WITH CONTRAST TECHNIQUE: Multidetector CT imaging of the chest was performed during intravenous contrast administration. CONTRAST:  77mL OMNIPAQUE IOHEXOL 300 MG/ML  SOLN COMPARISON:  Chest radiographs, 06/21/2018 FINDINGS: Cardiovascular: Heart is normal in size. No pericardial effusion. Three-vessel coronary artery calcifications. Great vessels are normal in caliber. Minor aortic atherosclerosis. Mediastinum/Nodes: No enlarged mediastinal, hilar, or axillary lymph nodes. Thyroid gland, trachea, and esophagus demonstrate no significant findings. Lungs/Pleura: Small nodules are noted along the diaphragmatic surface of the right lung base, adjacent to the oblique fissure, largest measuring 5 mm. There are multiple small calcified nodules consistent with healed granuloma most evident in the peripheral right upper lobe. There are scattered areas of peripheral reticular opacities consistent with scarring. No evidence of pneumonia or pulmonary edema. No pleural effusion or pneumothorax. Upper Abdomen: Fat herniates through the esophageal hiatus into the posterior inferior mediastinum. No visualized liver or adrenal masses. There is thickening of the transverse colon that is partly imaged, consistent with the given history of colon carcinoma. No acute findings in the upper abdomen. Musculoskeletal: No acute fracture. No osteoblastic or osteolytic lesions. IMPRESSION: 1. No acute findings. 2. Small pleural based nodules the base of the right lung, largest measuring 5 mm. These are stable compared to the lung bases from an abdomen and pelvis CT dated 10/17/2017 and relatively stable from a CT dated 02/22/2013,  therefore benign. 3. 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 4. Coronary artery and aortic atherosclerosis. Aortic Atherosclerosis (ICD10-I70.0). Electronically Signed   By: Lajean Manes M.D.   On: 08/25/2018 16:04        Scheduled Meds: . insulin aspart  0-15 Units Subcutaneous TID WC  . levothyroxine  50 mcg Oral QAC breakfast  . metoprolol tartrate  25 mg Oral BID  . sodium chloride flush  3 mL Intravenous Q12H  . tamsulosin  0.4 mg Oral QHS   Continuous Infusions: . sodium chloride       LOS: 2 days    Time spent: 67mins    Kathie Dike, MD [facility]   If 7PM-7AM, please contact night-coverage [email or web address]  08/27/2018, 1:04 PM

## 2018-08-28 ENCOUNTER — Inpatient Hospital Stay (HOSPITAL_COMMUNITY): Payer: Medicare Other

## 2018-08-28 ENCOUNTER — Encounter (HOSPITAL_COMMUNITY): Payer: Self-pay

## 2018-08-28 DIAGNOSIS — K6389 Other specified diseases of intestine: Secondary | ICD-10-CM

## 2018-08-28 DIAGNOSIS — Z01810 Encounter for preprocedural cardiovascular examination: Secondary | ICD-10-CM

## 2018-08-28 LAB — CBC
HCT: 33.3 % — ABNORMAL LOW (ref 39.0–52.0)
Hemoglobin: 9.8 g/dL — ABNORMAL LOW (ref 13.0–17.0)
MCH: 26.1 pg (ref 26.0–34.0)
MCHC: 29.4 g/dL — ABNORMAL LOW (ref 30.0–36.0)
MCV: 88.8 fL (ref 80.0–100.0)
Platelets: 273 10*3/uL (ref 150–400)
RBC: 3.75 MIL/uL — ABNORMAL LOW (ref 4.22–5.81)
RDW: 14.1 % (ref 11.5–15.5)
WBC: 7.6 10*3/uL (ref 4.0–10.5)
nRBC: 0 % (ref 0.0–0.2)

## 2018-08-28 LAB — GLUCOSE, CAPILLARY
GLUCOSE-CAPILLARY: 199 mg/dL — AB (ref 70–99)
Glucose-Capillary: 139 mg/dL — ABNORMAL HIGH (ref 70–99)
Glucose-Capillary: 153 mg/dL — ABNORMAL HIGH (ref 70–99)
Glucose-Capillary: 190 mg/dL — ABNORMAL HIGH (ref 70–99)
Glucose-Capillary: 185 mg/dL — ABNORMAL HIGH (ref 70–99)

## 2018-08-28 LAB — BASIC METABOLIC PANEL
Anion gap: 7 (ref 5–15)
BUN: 13 mg/dL (ref 8–23)
CO2: 25 mmol/L (ref 22–32)
Calcium: 8.5 mg/dL — ABNORMAL LOW (ref 8.9–10.3)
Chloride: 102 mmol/L (ref 98–111)
Creatinine, Ser: 0.94 mg/dL (ref 0.61–1.24)
GFR calc Af Amer: >60 mL/min (ref >60)
GFR calc non Af Amer: >60 mL/min (ref >60)
GLUCOSE: 202 mg/dL — AB (ref 70–99)
Potassium: 3.8 mmol/L (ref 3.5–5.1)
Sodium: 134 mmol/L — ABNORMAL LOW (ref 135–145)

## 2018-08-28 LAB — SURGICAL PCR SCREEN
MRSA, PCR: NEGATIVE
Staphylococcus aureus: NEGATIVE

## 2018-08-28 LAB — NM MYOCAR MULTI W/SPECT W/WALL MOTION / EF
CHL CUP RESTING HR STRESS: 81 {beats}/min
LHR: 0.36
LV dias vol: 77 mL (ref 62–150)
LV sys vol: 27 mL
NUC STRESS TID: 1.09
Peak HR: 91 {beats}/min
SDS: 0
SRS: 5
SSS: 5

## 2018-08-28 MED ORDER — TECHNETIUM TC 99M TETROFOSMIN IV KIT
10.0000 | PACK | Freq: Once | INTRAVENOUS | Status: AC | PRN
  Administered 2018-08-28: 11 via INTRAVENOUS

## 2018-08-28 MED ORDER — REGADENOSON 0.4 MG/5ML IV SOLN
INTRAVENOUS | Status: AC
  Administered 2018-08-28: 0.4 mg via INTRAVENOUS
  Filled 2018-08-28: qty 5

## 2018-08-28 MED ORDER — DEXTROMETHORPHAN POLISTIREX ER 30 MG/5ML PO SUER
30.0000 mg | Freq: Two times a day (BID) | ORAL | Status: DC
  Administered 2018-08-28 – 2018-08-31 (×5): 30 mg via ORAL
  Filled 2018-08-28 (×15): qty 5

## 2018-08-28 MED ORDER — POLYETHYLENE GLYCOL 3350 17 GM/SCOOP PO POWD
1.0000 | Freq: Once | ORAL | Status: DC
  Filled 2018-08-28: qty 255

## 2018-08-28 MED ORDER — TECHNETIUM TC 99M TETROFOSMIN IV KIT
30.0000 | PACK | Freq: Once | INTRAVENOUS | Status: AC | PRN
  Administered 2018-08-28: 31 via INTRAVENOUS

## 2018-08-28 MED ORDER — SODIUM CHLORIDE 0.9 % IV SOLN
2.0000 g | INTRAVENOUS | Status: AC
  Administered 2018-08-29 (×2): 2 g via INTRAVENOUS
  Filled 2018-08-28 (×2): qty 2

## 2018-08-28 MED ORDER — ACETAMINOPHEN 500 MG PO TABS: 1000.0000 mg | ORAL_TABLET | ORAL | Status: DC

## 2018-08-28 MED ORDER — CHLORHEXIDINE GLUCONATE CLOTH 2 % EX PADS
6.0000 | MEDICATED_PAD | Freq: Once | CUTANEOUS | Status: AC
  Administered 2018-08-29: 6 via TOPICAL

## 2018-08-28 MED ORDER — POLYETHYLENE GLYCOL 3350 17 G PO PACK
255.0000 g | PACK | Freq: Once | ORAL | Status: AC
  Administered 2018-08-28: 255 g via ORAL
  Filled 2018-08-28: qty 15

## 2018-08-28 MED ORDER — GABAPENTIN 300 MG PO CAPS: 300.0000 mg | ORAL_CAPSULE | ORAL | Status: DC

## 2018-08-28 MED ORDER — CHLORHEXIDINE GLUCONATE CLOTH 2 % EX PADS
6.0000 | MEDICATED_PAD | Freq: Once | CUTANEOUS | Status: AC
  Administered 2018-08-28: 6 via TOPICAL

## 2018-08-28 MED ORDER — SODIUM CHLORIDE 0.9% FLUSH
INTRAVENOUS | Status: AC
  Administered 2018-08-28: 10 mL via INTRAVENOUS
  Filled 2018-08-28: qty 10

## 2018-08-28 NOTE — Progress Notes (Signed)
[facility] Progress Note  3 Days Post-Op  Subjective: No major issues. Stress test low risk. Feeling good.   Objective: Vital signs in last 24 hours: Temp:  [97.9 F (36.6 C)-98.2 F (36.8 C)] 97.9 F (36.6 C) (01/20 1426) Pulse Rate:  [62-85] 62 (01/20 1426) Resp:  [18] 18 (01/20 1426) BP: (103-115)/(57-80) 103/71 (01/20 1426) SpO2:  [99 %-100 %] 100 % (01/20 1426)    Intake/Output from previous day: 01/19 0701 - 01/20 0700 In: 720 [P.O.:720] Out: 2000 [Urine:2000] Intake/Output this shift: Total I/O In: -  Out: 600 [Urine:600]  General appearance: alert, cooperative and no distress Resp: normal work breathing GI: soft, non-tender; bowel sounds normal; no masses,  no organomegaly ostomy pink  Lab Results:  Recent Labs    08/27/18 1046 08/28/18 0653  WBC 7.3 7.6  HGB 9.7* 9.8*  HCT 33.5* 33.3*  PLT 250 273   BMET Recent Labs    08/27/18 1046 08/28/18 0653  NA 132* 134*  K 4.0 3.8  CL 99 102  CO2 24 25  GLUCOSE 345* 202*  BUN 10 13  CREATININE 1.21 0.94  CALCIUM 8.5* 8.5*   PT/INR Recent Labs    08/25/18 1553  LABPROT 14.3  INR 1.12    Studies/Results: Nm Myocar Multi W/spect W/wall Motion / Ef  Result Date: 08/28/2018  The study is normal.  This is a low risk study.  The left ventricular ejection fraction is normal (55-65%).  Blood pressure demonstrated a normal response to exercise.  There was no ST segment deviation noted during stress.  Diaphragmatic attenuation no ischemia EF 64% Considered low risk study Ok to proceed with colon surgery at [facility]    Assessment/Plan: Jose Lawrence is a 78 yo with new colon cancer in the setting of 2 prior cancers and end colostomy. We will do a completion colectomy and end ileostomy. We have discussed the risk of surgery and the issues with ileostomy. Discussed risk of getting blood.   All questions answered. Clears today NPO midnight Miralax for bowel prep    LOS: 3 days     Jose Lawrence 08/28/2018

## 2018-08-28 NOTE — Progress Notes (Signed)
Inpatient Diabetes Program Recommendations  AACE/ADA: New Consensus Statement on Inpatient Glycemic Control (2015)  Target Ranges:  Prepandial:   less than 140 mg/dL      Peak postprandial:   less than 180 mg/dL (1-2 hours)      Critically ill patients:  140 - 180 mg/dL   Results for Jose Lawrence, Jose Lawrence (MRN [id number]) as of 08/28/2018 09:03  Ref. Range 08/27/2018 07:35 08/27/2018 11:33 08/27/2018 14:10 08/27/2018 16:00 08/27/2018 21:23  Glucose-Capillary Latest Ref Range: 70 - 99 mg/dL 197 (H)  Refused Novolog SSI 285 (H)  8 units NOVOLOG  311 (H) 254 (H)  11 units NOVOLOG  159 (H)   Results for Jose Lawrence, Jose Lawrence (MRN [id number]) as of 08/28/2018 09:03  Ref. Range 08/28/2018 08:01  Glucose-Capillary Latest Ref Range: 70 - 99 mg/dL 190 (H)  4 units NOVOLOG     Admit with: Colon Mass/ Anemia from Blood Loss  History: DM, Colon Cancer  Home DM Meds: Glipizide 10 mg BID  Current Orders: Novolog Resistant Correction Scale/ SSI (0-20 units) TID AC + HS     Needs to undergo Colectomy per Surgery (08/29/2018).  NPO for Myoview today.   MD- Please consider starting low dose basal insulin while home DM meds are on hold and patient continues to have elevated CBGs in hospital:  Recommend Lantus 9 units Daily (0.1 units/kg dosing based on weight of 87.9kg)  Please start today     --Will follow patient during hospitalization--  Wyn Quaker RN, MSN, CDE Diabetes Coordinator Inpatient Glycemic Control Team Team Pager: [phone] (8a-5p)

## 2018-08-28 NOTE — Progress Notes (Signed)
Progress Note  Patient Name: Jose Lawrence Date of Encounter: 08/28/2018  Primary Cardiologist: No primary care provider on file. Branch  Subjective   No cardiac complaints  Inpatient Medications    Scheduled Meds: . insulin aspart  0-20 Units Subcutaneous TID WC  . insulin aspart  0-5 Units Subcutaneous QHS  . levothyroxine  50 mcg Oral QAC breakfast  . metoprolol tartrate  25 mg Oral BID  . sodium chloride flush  3 mL Intravenous Q12H  . tamsulosin  0.4 mg Oral QHS   Continuous Infusions: . sodium chloride     PRN Meds: sodium chloride, acetaminophen **OR** acetaminophen, ondansetron **OR** ondansetron (ZOFRAN) IV, phenol, sodium chloride flush   Vital Signs    Vitals:   08/27/18 0611 08/27/18 1400 08/27/18 2118 08/28/18 0621  BP: 109/66 98/68 (!) 112/57 108/73  Pulse: 83 64 85 82  Resp: 18 18 18 18   Temp: 98.4 F (36.9 C) 98.2 F (36.8 C) 98.2 F (36.8 C) 98 F (36.7 C)  TempSrc: Oral Oral Oral Oral  SpO2: 96% 100% 100% 99%  Weight:      Height:        Intake/Output Summary (Last 24 hours) at 08/28/2018 0803 Last data filed at 08/27/2018 1858 Gross per 24 hour  Intake 720 ml  Output 2000 ml  Net -1280 ml   Last 3 Weights 08/25/2018 08/21/2018 08/03/2018  Weight (lbs) 193 lb 12.6 oz 192 lb 197 lb  Weight (kg) 87.9 kg 87.091 kg 89.359 kg      Telemetry    Afib  - Personally Reviewed  ECG    Afib rate 91 non specific ST changes - Personally Reviewed  Physical Exam  Pale  GEN: No acute distress.   Neck: No JVD Cardiac: RRR, no murmurs, rubs, or gallops.  Respiratory: Clear to auscultation bilaterally. GI: Soft, nontender, non-distended  MS: No edema; No deformity. Neuro:  Nonfocal  Psych: Normal affect   Labs    Chemistry Recent Labs  Lab 08/21/18 0853 08/25/18 1553 08/26/18 0643 08/27/18 1046 08/28/18 0653  NA 136 135 135 132* 134*  K 3.9 3.9 3.8 4.0 3.8  CL 105 102 103 99 102  CO2 24 24 21* 24 25  GLUCOSE 147* 158* 176*  345* 202*  BUN 12 11 10 10 13   CREATININE 0.80 0.83 0.92 1.21 0.94  CALCIUM 8.8* 8.7* 8.7* 8.5* 8.5*  PROT 6.8 6.8  --   --   --   ALBUMIN 3.2* 3.2*  --   --   --   AST 22 21  --   --   --   ALT 15 15  --   --   --   ALKPHOS 69 74  --   --   --   BILITOT 0.5 0.6  --   --   --   GFRNONAA >60 >60 >60 58* >60  GFRAA >60 >60 >60 >60 >60  ANIONGAP 7 9 11 9 7      Hematology Recent Labs  Lab 08/26/18 0643 08/27/18 1046 08/28/18 0653  WBC 6.8 7.3 7.6  RBC 3.78* 3.78* 3.75*  HGB 9.9* 9.7* 9.8*  HCT 33.6* 33.5* 33.3*  MCV 88.9 88.6 88.8  MCH 26.2 25.7* 26.1  MCHC 29.5* 29.0* 29.4*  RDW 14.4 14.1 14.1  PLT 270 250 273    Cardiac EnzymesNo results for input(s): TROPONINI in the last 168 hours. No results for input(s): TROPIPOC in the last 168 hours.   BNPNo results for input(s):  BNP, PROBNP in the last 168 hours.   DDimer No results for input(s): DDIMER in the last 168 hours.   Radiology    No results found.  Cardiac Studies   TTE EF 70-75%    Assessment and Plan      78 y.o. male recurrent anemia and melena with ostomy transverse colon cancer needs surgical Rx. Seen by Dr Domenic Polite who ordered myovue for 08/28/18 to risk stratify. Not a candidate for Intervention due to bleeding and anemia No known CAD History of DM-2 CVA PE.   Afib is chronic And not on anticoagulation due to bleeding   1. Preoperative:  Per SM if myovue low risk can stay at [facility] and have surgery in am if not transfer to [facility] for surgery 2. Afib: chronic rate control ok on bid lopressor consider adding eliquis back post surgery after source of bleeding eliminated 3. DM:  Discussed low carb diet.  Target hemoglobin A1c is 6.5 or less.  Continue current medications. 4. Thyroid :  Continue synthroid replacement   Testing     Myovue today       For questions or updates, please contact [facility] Please consult [email or web address] for contact info under        Signed, Jenkins Rouge, MD  08/28/2018,  8:03 AM

## 2018-08-28 NOTE — Progress Notes (Signed)
Patient states he is still noticing blood in his colostomy bag.  It is not large volume Hgb 9.8 g from this morning. Biopsy of colonic lesion reveals adenocarcinoma. Results reviewed with patient wife and their daughter. Patient is scheduled for surgery on 08/29/2018.

## 2018-08-28 NOTE — Progress Notes (Signed)
PROGRESS NOTE    Jose Lawrence  DVV:[id number] DOB: 08/28/1940 DOA: 08/25/2018 PCP: Glenda Chroman, MD    Brief Narrative:  78 year old male with a history of colon cancer status post resection and colostomy, with undergoing colonoscopy for a 2 to 3-week history of dark-colored stool.  He was found to have a partially obstructing lesion in his transverse colon which was a source of dark stools.  He was admitted to the hospital and seen by general surgery with plans for resection.  Cardiology is also evaluated the patient for preoperative evaluation.  He underwent stress test on 1/20 that was found to be low risk study.  Plans are for operative management on 1/21.   Assessment & Plan:   Principal Problem:   Mass of colon Active Problems:   Iron deficiency anemia due to chronic blood loss   Hypothyroidism   HTN (hypertension)   DM (diabetes mellitus), type 2 (HCC)   Colostomy in place [facility])   BPH (benign prostatic hyperplasia)   Atrial fibrillation, chronic   1. Colon mass.  Patient found to have a partially obstructing mass in the transverse colon.  Seen by general surgery with plans for total colectomy and ileostomy.  Currently on soft diet.  CT scan of the chest did not show any clear evidence of metastatic disease.  Seen by cardiology for preoperative work-up.  Patient underwent stress test that was found to be low risk study.  Plans are for operative management on 1/21. 2. Anemia secondary to chronic blood loss.  Hemoglobin has been stable since coming to the hospital.  Continue to follow. 3. Diabetes.  Holding oral agents.  Continue on sliding scale insulin.  Will be n.p.o. after midnight. 4. Hypertension.  Chronically on valsartan.  Currently on hold in the setting of bleeding.  Blood pressures been stable 5. BPH.  Continue on Flomax. 6. Hypothyroidism.  Continue on Synthroid. 7. Chronic atrial fibrillation.  Heart rate is stable on low-dose metoprolol.  He was not on  anticoagulation in the past due to issues with anemia.  He is chronically on aspirin which is currently on hold due to bleeding issues.   DVT prophylaxis: SCDs Code Status: Full code Family Communication: Discussed with wife at bedside Disposition Plan: Discharge home after surgery   Consultants:   General surgery  Cardiology  Gastroenterology  Procedures:  Colonoscopy:- Widely patent end colostomy with healthy                            appearing mucosa in the descending colon.                           - Malignant partially obstructing tumor in the mid                            transverse colon with active bleeding. Biopsied.                           - The descending colon, proximal transverse colon,                            hepatic flexure, ascending colon and cecum are  normal.  Antimicrobials:       Subjective: No complaints of chest pain or shortness of breath.  No nausea or vomiting.  Objective: Vitals:   08/27/18 1400 08/27/18 2118 08/28/18 0621 08/28/18 0853  BP: 98/68 (!) 112/57 108/73 115/80  Pulse: 64 85 82   Resp: 18 18 18    Temp: 98.2 F (36.8 C) 98.2 F (36.8 C) 98 F (36.7 C)   TempSrc: Oral Oral Oral   SpO2: 100% 100% 99%   Weight:      Height:        Intake/Output Summary (Last 24 hours) at 08/28/2018 1420 Last data filed at 08/28/2018 1137 Gross per 24 hour  Intake 480 ml  Output 2000 ml  Net -1520 ml   Filed Weights   08/25/18 1037  Weight: 87.9 kg    Examination:  General exam: Alert, awake, oriented x 3 Respiratory system: Clear to auscultation. Respiratory effort normal. Cardiovascular system: Irregular. No murmurs, rubs, gallops. Gastrointestinal system: Abdomen is nondistended, soft and nontender. No organomegaly or masses felt. Normal bowel sounds heard.  Colostomy left lower quadrant Central nervous system: Alert and oriented. No focal neurological deficits. Extremities: No C/C/E, +pedal  pulses Skin: No rashes, lesions or ulcers Psychiatry: Judgement and insight appear normal. Mood & affect appropriate.     Data Reviewed: I have personally reviewed following labs and imaging studies  CBC: Recent Labs  Lab 08/25/18 1553 08/26/18 0643 08/27/18 1046 08/28/18 0653  WBC 6.6 6.8 7.3 7.6  HGB 9.6* 9.9* 9.7* 9.8*  HCT 32.7* 33.6* 33.5* 33.3*  MCV 89.8 88.9 88.6 88.8  PLT 258 270 250 656   Basic Metabolic Panel: Recent Labs  Lab 08/25/18 1553 08/26/18 0643 08/27/18 1046 08/28/18 0653  NA 135 135 132* 134*  K 3.9 3.8 4.0 3.8  CL 102 103 99 102  CO2 24 21* 24 25  GLUCOSE 158* 176* 345* 202*  BUN 11 10 10 13   CREATININE 0.83 0.92 1.21 0.94  CALCIUM 8.7* 8.7* 8.5* 8.5*   GFR: Estimated Creatinine Clearance: 74.5 mL/min (by C-G formula based on SCr of 0.94 mg/dL). Liver Function Tests: Recent Labs  Lab 08/25/18 1553  AST 21  ALT 15  ALKPHOS 74  BILITOT 0.6  PROT 6.8  ALBUMIN 3.2*   No results for input(s): LIPASE, AMYLASE in the last 168 hours. No results for input(s): AMMONIA in the last 168 hours. Coagulation Profile: Recent Labs  Lab 08/25/18 1553  INR 1.12   Cardiac Enzymes: No results for input(s): CKTOTAL, CKMB, CKMBINDEX, TROPONINI in the last 168 hours. BNP (last 3 results) No results for input(s): PROBNP in the last 8760 hours. HbA1C: No results for input(s): HGBA1C in the last 72 hours. CBG: Recent Labs  Lab 08/27/18 1133 08/27/18 1410 08/27/18 1600 08/27/18 2123 08/28/18 0801  GLUCAP 285* 311* 254* 159* 190*   Lipid Profile: No results for input(s): CHOL, HDL, LDLCALC, TRIG, CHOLHDL, LDLDIRECT in the last 72 hours. Thyroid Function Tests: No results for input(s): TSH, T4TOTAL, FREET4, T3FREE, THYROIDAB in the last 72 hours. Anemia Panel: No results for input(s): VITAMINB12, FOLATE, FERRITIN, TIBC, IRON, RETICCTPCT in the last 72 hours. Sepsis Labs: No results for input(s): PROCALCITON, LATICACIDVEN in the last 168  hours.  No results found for this or any previous visit (from the past 240 hour(s)).       Radiology Studies: Nm Myocar Multi W/spect W/wall Motion / Ef  Result Date: 08/28/2018  The study is normal.  This is a low risk  study.  The left ventricular ejection fraction is normal (55-65%).  Blood pressure demonstrated a normal response to exercise.  There was no ST segment deviation noted during stress.  Diaphragmatic attenuation no ischemia EF 64% Considered low risk study Ok to proceed with colon surgery at [place]: . insulin aspart  0-20 Units Subcutaneous TID WC  . insulin aspart  0-5 Units Subcutaneous QHS  . levothyroxine  50 mcg Oral QAC breakfast  . metoprolol tartrate  25 mg Oral BID  . sodium chloride flush  3 mL Intravenous Q12H  . tamsulosin  0.4 mg Oral QHS   Continuous Infusions: . sodium chloride       LOS: 3 days    Time spent: 26mins    Kathie Dike, MD [facility]   If 7PM-7AM, please contact night-coverage [email or web address]  08/28/2018, 2:20 PM

## 2018-08-29 ENCOUNTER — Encounter (HOSPITAL_COMMUNITY): Payer: Self-pay | Admitting: Anesthesiology

## 2018-08-29 ENCOUNTER — Inpatient Hospital Stay (HOSPITAL_COMMUNITY): Payer: Medicare Other | Admitting: Anesthesiology

## 2018-08-29 ENCOUNTER — Encounter (HOSPITAL_COMMUNITY)
Admission: RE | Disposition: A | Discharge status: Home / Self Care | Payer: Self-pay | Source: Home / Self Care | Attending: General Surgery

## 2018-08-29 DIAGNOSIS — Z933 Colostomy status: Secondary | ICD-10-CM

## 2018-08-29 DIAGNOSIS — I10 Essential (primary) hypertension: Secondary | ICD-10-CM

## 2018-08-29 DIAGNOSIS — I482 Chronic atrial fibrillation, unspecified: Secondary | ICD-10-CM

## 2018-08-29 DIAGNOSIS — N400 Enlarged prostate without lower urinary tract symptoms: Secondary | ICD-10-CM

## 2018-08-29 DIAGNOSIS — E1169 Type 2 diabetes mellitus with other specified complication: Secondary | ICD-10-CM

## 2018-08-29 DIAGNOSIS — K6389 Other specified diseases of intestine: Secondary | ICD-10-CM

## 2018-08-29 DIAGNOSIS — C184 Malignant neoplasm of transverse colon: Principal | ICD-10-CM

## 2018-08-29 HISTORY — PX: BLADDER REPAIR: SHX6721

## 2018-08-29 HISTORY — PX: ILEOSTOMY: SHX1783

## 2018-08-29 HISTORY — PX: PARTIAL COLECTOMY: SHX5273

## 2018-08-29 HISTORY — PX: LYSIS OF ADHESION: SHX5961

## 2018-08-29 LAB — GLUCOSE, CAPILLARY
GLUCOSE-CAPILLARY: 157 mg/dL — AB (ref 70–99)
GLUCOSE-CAPILLARY: 312 mg/dL — AB (ref 70–99)
Glucose-Capillary: 170 mg/dL — ABNORMAL HIGH (ref 70–99)
Glucose-Capillary: 150 mg/dL — ABNORMAL HIGH (ref 70–99)
Glucose-Capillary: 285 mg/dL — ABNORMAL HIGH (ref 70–99)

## 2018-08-29 LAB — CBC
HCT: 35.3 % — ABNORMAL LOW (ref 39.0–52.0)
Hemoglobin: 10.3 g/dL — ABNORMAL LOW (ref 13.0–17.0)
MCH: 26.6 pg (ref 26.0–34.0)
MCHC: 29.2 g/dL — ABNORMAL LOW (ref 30.0–36.0)
MCV: 91.2 fL (ref 80.0–100.0)
NRBC: 0 % (ref 0.0–0.2)
Platelets: 362 10*3/uL (ref 150–400)
RBC: 3.87 MIL/uL — ABNORMAL LOW (ref 4.22–5.81)
RDW: 13.9 % (ref 11.5–15.5)
WBC: 17.8 10*3/uL — ABNORMAL HIGH (ref 4.0–10.5)

## 2018-08-29 LAB — POCT I-STAT 4, (NA,K, GLUC, HGB,HCT)
Glucose, Bld: 237 mg/dL — ABNORMAL HIGH (ref 70–99)
HCT: 26 % — ABNORMAL LOW (ref 39.0–52.0)
Hemoglobin: 8.8 g/dL — ABNORMAL LOW (ref 13.0–17.0)
Potassium: 4.7 mmol/L (ref 3.5–5.1)
Sodium: 135 mmol/L (ref 135–145)

## 2018-08-29 LAB — PREPARE RBC (CROSSMATCH)

## 2018-08-29 SURGERY — COLECTOMY, PARTIAL
Anesthesia: General

## 2018-08-29 MED ORDER — SODIUM CHLORIDE 0.9 % IV SOLN
INTRAVENOUS | Status: AC
  Filled 2018-08-29: qty 2

## 2018-08-29 MED ORDER — DIPHENHYDRAMINE HCL 12.5 MG/5ML PO ELIX: 12.5000 mg | ORAL_SOLUTION | Freq: Four times a day (QID) | ORAL | Status: DC | PRN

## 2018-08-29 MED ORDER — ROCURONIUM BROMIDE 10 MG/ML (PF) SYRINGE
PREFILLED_SYRINGE | INTRAVENOUS | Status: AC
  Filled 2018-08-29: qty 10

## 2018-08-29 MED ORDER — KETOROLAC TROMETHAMINE 15 MG/ML IJ SOLN
15.0000 mg | Freq: Four times a day (QID) | INTRAMUSCULAR | Status: AC
  Administered 2018-08-29 – 2018-09-03 (×19): 15 mg via INTRAVENOUS
  Filled 2018-08-29 (×19): qty 1

## 2018-08-29 MED ORDER — 0.9 % SODIUM CHLORIDE (POUR BTL) OPTIME
TOPICAL | Status: DC | PRN
  Administered 2018-08-29: 1000 mL

## 2018-08-29 MED ORDER — FENTANYL CITRATE (PF) 250 MCG/5ML IJ SOLN
INTRAMUSCULAR | Status: AC
  Filled 2018-08-29: qty 5

## 2018-08-29 MED ORDER — ARTIFICIAL TEARS OPHTHALMIC OINT
TOPICAL_OINTMENT | OPHTHALMIC | Status: AC
  Filled 2018-08-29: qty 3.5

## 2018-08-29 MED ORDER — PHENYLEPHRINE 40 MCG/ML (10ML) SYRINGE FOR IV PUSH (FOR BLOOD PRESSURE SUPPORT)
PREFILLED_SYRINGE | INTRAVENOUS | Status: AC
  Filled 2018-08-29: qty 20

## 2018-08-29 MED ORDER — FENTANYL CITRATE (PF) 100 MCG/2ML IJ SOLN
INTRAMUSCULAR | Status: DC | PRN
  Administered 2018-08-29 (×10): 50 ug via INTRAVENOUS

## 2018-08-29 MED ORDER — DIPHENHYDRAMINE HCL 50 MG/ML IJ SOLN
12.5000 mg | Freq: Four times a day (QID) | INTRAMUSCULAR | Status: DC | PRN
  Administered 2018-08-30: 12.5 mg via INTRAVENOUS
  Filled 2018-08-29: qty 1

## 2018-08-29 MED ORDER — LIDOCAINE 2% (20 MG/ML) 5 ML SYRINGE
INTRAMUSCULAR | Status: DC | PRN
  Administered 2018-08-29: 40 mg via INTRAVENOUS

## 2018-08-29 MED ORDER — KETOROLAC TROMETHAMINE 30 MG/ML IJ SOLN: 30.0000 mg | Freq: Once | INTRAMUSCULAR | Status: DC | PRN

## 2018-08-29 MED ORDER — PHENYLEPHRINE HCL 10 MG/ML IJ SOLN
INTRAMUSCULAR | Status: DC | PRN
  Administered 2018-08-29: 80 ug via INTRAVENOUS
  Administered 2018-08-29: 100 ug via INTRAVENOUS
  Administered 2018-08-29 (×4): 80 ug via INTRAVENOUS
  Administered 2018-08-29: 200 ug via INTRAVENOUS
  Administered 2018-08-29 (×3): 80 ug via INTRAVENOUS

## 2018-08-29 MED ORDER — PROPOFOL 10 MG/ML IV BOLUS
INTRAVENOUS | Status: DC | PRN
  Administered 2018-08-29: 150 mg via INTRAVENOUS

## 2018-08-29 MED ORDER — GLYCOPYRROLATE PF 0.2 MG/ML IJ SOSY
PREFILLED_SYRINGE | INTRAMUSCULAR | Status: DC | PRN
  Administered 2018-08-29 (×2): .2 mg via INTRAVENOUS

## 2018-08-29 MED ORDER — HEPARIN SODIUM (PORCINE) 5000 UNIT/ML IJ SOLN
5000.0000 [IU] | Freq: Three times a day (TID) | INTRAMUSCULAR | Status: DC
  Administered 2018-08-30 – 2018-09-04 (×15): 5000 [IU] via SUBCUTANEOUS
  Filled 2018-08-29 (×15): qty 1

## 2018-08-29 MED ORDER — SUCCINYLCHOLINE CHLORIDE 20 MG/ML IJ SOLN
INTRAMUSCULAR | Status: DC | PRN
  Administered 2018-08-29: 120 mg via INTRAVENOUS

## 2018-08-29 MED ORDER — KETOROLAC TROMETHAMINE 30 MG/ML IJ SOLN
INTRAMUSCULAR | Status: AC
  Filled 2018-08-29: qty 1

## 2018-08-29 MED ORDER — HYDROCODONE-ACETAMINOPHEN 7.5-325 MG PO TABS: 1.0000 | ORAL_TABLET | Freq: Once | ORAL | Status: DC | PRN

## 2018-08-29 MED ORDER — TETRACAINE HCL 0.5 % OP SOLN
OPHTHALMIC | Status: AC
  Filled 2018-08-29: qty 4

## 2018-08-29 MED ORDER — ROCURONIUM BROMIDE 50 MG/5ML IV SOSY
PREFILLED_SYRINGE | INTRAVENOUS | Status: DC | PRN
  Administered 2018-08-29: 40 mg via INTRAVENOUS
  Administered 2018-08-29 (×2): 20 mg via INTRAVENOUS
  Administered 2018-08-29: 10 mg via INTRAVENOUS
  Administered 2018-08-29 (×2): 20 mg via INTRAVENOUS

## 2018-08-29 MED ORDER — PROPOFOL 10 MG/ML IV BOLUS
INTRAVENOUS | Status: AC
  Filled 2018-08-29: qty 40

## 2018-08-29 MED ORDER — METHYLENE BLUE 0.5 % INJ SOLN
INTRAVENOUS | Status: AC
  Filled 2018-08-29: qty 10

## 2018-08-29 MED ORDER — EPHEDRINE SULFATE 50 MG/ML IJ SOLN
INTRAMUSCULAR | Status: DC | PRN
  Administered 2018-08-29: 10 mg via INTRAVENOUS

## 2018-08-29 MED ORDER — MEPERIDINE HCL 50 MG/ML IJ SOLN: 6.2500 mg | INTRAMUSCULAR | Status: DC | PRN

## 2018-08-29 MED ORDER — SODIUM CHLORIDE 0.9 % IV SOLN
2.0000 g | Freq: Two times a day (BID) | INTRAVENOUS | Status: AC
  Administered 2018-08-30 – 2018-08-31 (×4): 2 g via INTRAVENOUS
  Filled 2018-08-29 (×4): qty 2

## 2018-08-29 MED ORDER — MORPHINE SULFATE (PF) 2 MG/ML IV SOLN
2.0000 mg | INTRAVENOUS | Status: DC | PRN
  Administered 2018-08-29 – 2018-09-01 (×4): 2 mg via INTRAVENOUS
  Filled 2018-08-29 (×4): qty 1

## 2018-08-29 MED ORDER — METOPROLOL TARTRATE 5 MG/5ML IV SOLN
5.0000 mg | Freq: Four times a day (QID) | INTRAVENOUS | Status: DC | PRN
  Filled 2018-08-29: qty 5

## 2018-08-29 MED ORDER — LACTATED RINGERS IV SOLN
INTRAVENOUS | Status: DC
  Administered 2018-08-29 – 2018-09-01 (×7): via INTRAVENOUS

## 2018-08-29 MED ORDER — PHENYLEPHRINE 40 MCG/ML (10ML) SYRINGE FOR IV PUSH (FOR BLOOD PRESSURE SUPPORT)
PREFILLED_SYRINGE | INTRAVENOUS | Status: AC
  Filled 2018-08-29: qty 10

## 2018-08-29 MED ORDER — GLYCOPYRROLATE PF 0.2 MG/ML IJ SOSY
PREFILLED_SYRINGE | INTRAMUSCULAR | Status: AC
  Filled 2018-08-29: qty 1

## 2018-08-29 MED ORDER — ONDANSETRON HCL 4 MG/2ML IJ SOLN
INTRAMUSCULAR | Status: AC
  Filled 2018-08-29: qty 2

## 2018-08-29 MED ORDER — BUPIVACAINE LIPOSOME 1.3 % IJ SUSP
INTRAMUSCULAR | Status: AC
  Filled 2018-08-29: qty 20

## 2018-08-29 MED ORDER — OXYCODONE HCL 5 MG PO TABS
5.0000 mg | ORAL_TABLET | ORAL | Status: DC | PRN
  Administered 2018-08-30: 5 mg via ORAL
  Administered 2018-08-30 – 2018-09-01 (×8): 10 mg via ORAL
  Filled 2018-08-29 (×9): qty 2

## 2018-08-29 MED ORDER — METOPROLOL TARTRATE 5 MG/5ML IV SOLN
5.0000 mg | Freq: Four times a day (QID) | INTRAVENOUS | Status: DC
  Administered 2018-08-29 – 2018-09-03 (×18): 5 mg via INTRAVENOUS
  Filled 2018-08-29 (×18): qty 5

## 2018-08-29 MED ORDER — SUCCINYLCHOLINE CHLORIDE 200 MG/10ML IV SOSY
PREFILLED_SYRINGE | INTRAVENOUS | Status: AC
  Filled 2018-08-29: qty 10

## 2018-08-29 MED ORDER — NEOSTIGMINE METHYLSULFATE 10 MG/10ML IV SOLN
INTRAVENOUS | Status: DC | PRN
  Administered 2018-08-29: 3 mg via INTRAVENOUS

## 2018-08-29 MED ORDER — ONDANSETRON HCL 4 MG/2ML IJ SOLN
INTRAMUSCULAR | Status: DC | PRN
  Administered 2018-08-29: 4 mg via INTRAVENOUS

## 2018-08-29 MED ORDER — LACTATED RINGERS IV SOLN
INTRAVENOUS | Status: DC
  Administered 2018-08-29 (×5): via INTRAVENOUS

## 2018-08-29 MED ORDER — PANTOPRAZOLE SODIUM 40 MG IV SOLR
40.0000 mg | Freq: Every day | INTRAVENOUS | Status: DC
  Administered 2018-08-29 – 2018-09-02 (×5): 40 mg via INTRAVENOUS
  Filled 2018-08-29 (×5): qty 40

## 2018-08-29 MED ORDER — DEXAMETHASONE SODIUM PHOSPHATE 4 MG/ML IJ SOLN
INTRAMUSCULAR | Status: DC | PRN
  Administered 2018-08-29: 10 mg via INTRAVENOUS

## 2018-08-29 MED ORDER — ONDANSETRON HCL 4 MG/2ML IJ SOLN: 4.0000 mg | Freq: Once | INTRAMUSCULAR | Status: DC | PRN

## 2018-08-29 MED ORDER — HYDROMORPHONE HCL 1 MG/ML IJ SOLN
0.2500 mg | INTRAMUSCULAR | Status: DC | PRN
  Administered 2018-08-29: 0.5 mg via INTRAVENOUS
  Filled 2018-08-29: qty 0.5

## 2018-08-29 MED ORDER — DEXAMETHASONE SODIUM PHOSPHATE 10 MG/ML IJ SOLN
INTRAMUSCULAR | Status: AC
  Filled 2018-08-29: qty 1

## 2018-08-29 MED ORDER — LIDOCAINE 2% (20 MG/ML) 5 ML SYRINGE
INTRAMUSCULAR | Status: AC
  Filled 2018-08-29: qty 5

## 2018-08-29 SURGICAL SUPPLY — 62 items
BAG URINE DRAINAGE (UROLOGICAL SUPPLIES) ×2 IMPLANT
BARRIER SKIN 2 3/4 (OSTOMY) ×3 IMPLANT
BARRIER SKIN 2 3/4 INCH (OSTOMY) ×1
BARRIER SKIN OD2.25 2 3/4 FLNG (OSTOMY) IMPLANT
CATH FOLEY 2WAY SLVR  5CC 20FR (CATHETERS) ×2
CATH FOLEY 2WAY SLVR 5CC 20FR (CATHETERS) IMPLANT
COVER LIGHT HANDLE STERIS (MISCELLANEOUS) ×8 IMPLANT
COVER WAND RF STERILE (DRAPES) ×2 IMPLANT
DRAPE HALF SHEET 40X57 (DRAPES) ×2 IMPLANT
DRSG OPSITE POSTOP 4X10 (GAUZE/BANDAGES/DRESSINGS) ×2 IMPLANT
ELECT BLADE 6 FLAT ULTRCLN (ELECTRODE) ×2 IMPLANT
ELECT REM PT RETURN 9FT ADLT (ELECTROSURGICAL) ×4
ELECTRODE REM PT RTRN 9FT ADLT (ELECTROSURGICAL) ×2 IMPLANT
EVACUATOR DRAINAGE 10X20 100CC (DRAIN) IMPLANT
EVACUATOR SILICONE 100CC (DRAIN) ×2
GAUZE 4X4 16PLY RFD (DISPOSABLE) ×4 IMPLANT
GLOVE BIO SURGEON STRL SZ 6.5 (GLOVE) ×7 IMPLANT
GLOVE BIO SURGEON STRL SZ7 (GLOVE) ×2 IMPLANT
GLOVE BIO SURGEONS STRL SZ 6.5 (GLOVE) ×3
GLOVE BIOGEL PI IND STRL 6.5 (GLOVE) ×4 IMPLANT
GLOVE BIOGEL PI IND STRL 7.0 (GLOVE) ×12 IMPLANT
GLOVE BIOGEL PI IND STRL 7.5 (GLOVE) IMPLANT
GLOVE BIOGEL PI INDICATOR 6.5 (GLOVE) ×8
GLOVE BIOGEL PI INDICATOR 7.0 (GLOVE) ×12
GLOVE BIOGEL PI INDICATOR 7.5 (GLOVE) ×2
GLOVE SURG SS PI 6.5 STRL IVOR (GLOVE) ×4 IMPLANT
GLOVE SURG SS PI 7.5 STRL IVOR (GLOVE) ×2 IMPLANT
GOWN STRL REUS W/ TWL LRG LVL3 (GOWN DISPOSABLE) IMPLANT
GOWN STRL REUS W/TWL LRG LVL3 (GOWN DISPOSABLE) ×24 IMPLANT
INST SET MAJOR GENERAL (KITS) ×4 IMPLANT
KIT COLOSTOMY ILEOSTOMY 4 (WOUND CARE) ×2 IMPLANT
KIT TURNOVER KIT A (KITS) ×4 IMPLANT
LIGASURE IMPACT 36 18CM CVD LR (INSTRUMENTS) ×4 IMPLANT
MANIFOLD NEPTUNE II (INSTRUMENTS) ×4 IMPLANT
NDL HYPO 18GX1.5 BLUNT FILL (NEEDLE) ×2 IMPLANT
NDL HYPO 21X1.5 SAFETY (NEEDLE) ×2 IMPLANT
NEEDLE HYPO 18GX1.5 BLUNT FILL (NEEDLE) ×4 IMPLANT
NEEDLE HYPO 21X1.5 SAFETY (NEEDLE) ×4 IMPLANT
NS IRRIG 1000ML POUR BTL (IV SOLUTION) ×6 IMPLANT
PACK COLON (CUSTOM PROCEDURE TRAY) ×4 IMPLANT
PAD ABD 5X9 TENDERSORB (GAUZE/BANDAGES/DRESSINGS) ×2 IMPLANT
PAD ARMBOARD 7.5X6 YLW CONV (MISCELLANEOUS) ×4 IMPLANT
PENCIL HANDSWITCHING (ELECTRODE) ×4 IMPLANT
RELOAD PROXIMATE 75MM BLUE (ENDOMECHANICALS) ×4 IMPLANT
RELOAD STAPLE 75 3.8 BLU REG (ENDOMECHANICALS) IMPLANT
SPONGE DRAIN TRACH 4X4 STRL 2S (GAUZE/BANDAGES/DRESSINGS) ×2 IMPLANT
SPONGE LAP 18X18 RF (DISPOSABLE) ×18 IMPLANT
STAPLER GUN LINEAR PROX 60 (STAPLE) ×4 IMPLANT
STAPLER PROXIMATE 75MM BLUE (STAPLE) ×2 IMPLANT
STAPLER VISISTAT (STAPLE) ×6 IMPLANT
SUT CHROMIC 3 0 SH 27 (SUTURE) ×10 IMPLANT
SUT PDS AB CT VIOLET #0 27IN (SUTURE) ×4 IMPLANT
SUT PROLENE 0 CT 1 30 (SUTURE) ×6 IMPLANT
SUT PROLENE NAB BLUE 3-0 30IN (SUTURE) ×2 IMPLANT
SUT SILK 0 FSL (SUTURE) ×4 IMPLANT
SUT SILK 3 0 SH CR/8 (SUTURE) ×6 IMPLANT
SUT VIC AB 2-0 CT2 27 (SUTURE) ×6 IMPLANT
SUT VIC AB 3-0 SH 27 (SUTURE) ×4
SUT VIC AB 3-0 SH 27X BRD (SUTURE) IMPLANT
SYR 20CC LL (SYRINGE) ×4 IMPLANT
TRAY FOLEY MTR SLVR 16FR STAT (SET/KITS/TRAYS/PACK) ×4 IMPLANT
YANKAUER SUCT BULB TIP 10FT TU (MISCELLANEOUS) ×4 IMPLANT

## 2018-08-29 NOTE — Interval H&P Note (Signed)
History and Physical Interval Note:  08/29/2018 12:27 PM  Jose Lawrence  has presented today for surgery, with the diagnosis of colon cancer  The various methods of treatment have been discussed with the patient and family. After consideration of risks, benefits and other options for treatment, the patient has consented to  Procedure(s): PARTIAL COLECTOMY (N/A) as a surgical intervention .  The patient's history has been reviewed, patient examined, no change in status, stable for surgery.  I have reviewed the patient's chart and labs.  Questions were answered to the patient's satisfaction.    No questions. Took prep.   [name]

## 2018-08-29 NOTE — Anesthesia Postprocedure Evaluation (Signed)
Anesthesia Post Note  Patient: LEWELLYN FULTZ  Procedure(s) Performed: PARTIAL COLECTOMY (N/A ) LYSIS OF ADHESION ILEOSTOMY BLADDER REPAIR  Patient location during evaluation: PACU Anesthesia Type: General Level of consciousness: awake and alert Pain management: pain level controlled Vital Signs Assessment: post-procedure vital signs reviewed and stable Respiratory status: spontaneous breathing, nonlabored ventilation, respiratory function stable and patient connected to nasal cannula oxygen Cardiovascular status: blood pressure returned to baseline and stable Postop Assessment: no apparent nausea or vomiting Anesthetic complications: no Comments: Patient complaining of sensation that "something is in my (right) eye".  No gross changes noted.  Suspect abrasion despite tape and lube intraop...possible postop contact.  Eyelid closed and paper tape applied to maintain closed eyelid.       Last Vitals:  Vitals:   08/29/18 1915 08/29/18 1930  BP: (!) 94/50 93/84  Pulse: 97 96  Resp: 20 18  Temp:    SpO2: 98% 98%    Last Pain:  Vitals:   08/29/18 1909  TempSrc:   PainSc: [place]

## 2018-08-29 NOTE — Anesthesia Procedure Notes (Signed)
Procedure Name: Intubation Date/Time: 08/29/2018 1:09 PM Performed by: Andree Elk, Amy A, CRNA Pre-anesthesia Checklist: Patient identified, Patient being monitored, Timeout performed, Emergency Drugs available and Suction available Patient Re-evaluated:Patient Re-evaluated prior to induction Oxygen Delivery Method: Circle system utilized Preoxygenation: Pre-oxygenation with 100% oxygen Induction Type: IV induction Ventilation: Mask ventilation without difficulty Laryngoscope Size: 3 and Miller Grade View: Grade I Tube type: Oral Tube size: 7.0 mm Number of attempts: 1 Airway Equipment and Method: Stylet Placement Confirmation: ETT inserted through vocal cords under direct vision,  positive ETCO2 and breath sounds checked- equal and bilateral Secured at: 21 cm Tube secured with: Tape Dental Injury: Teeth and Oropharynx as per pre-operative assessment

## 2018-08-29 NOTE — Progress Notes (Signed)
PROGRESS NOTE    Jose Lawrence  PIR:[id number] DOB: 21-Aug-1940 DOA: 08/25/2018 PCP: Jose Chroman, MD    Brief Narrative:  78 year old male with a history of colon cancer status post resection and colostomy, with undergoing colonoscopy for a 2 to 3-week history of dark-colored stool.  He was found to have a partially obstructing lesion in his transverse colon which was a source of dark stools.  He was admitted to the hospital and seen by general surgery with plans for resection.  Cardiology is also evaluated the patient for preoperative evaluation.  He underwent stress test on 1/20 that was found to be low risk study.  Plans are for operative management on 1/21.  Assessment & Plan:   Principal Problem:   Mass of colon Active Problems:   Iron deficiency anemia due to chronic blood loss   Hypothyroidism   HTN (hypertension)   DM (diabetes mellitus), type 2 (HCC)   Colostomy in place [facility])   BPH (benign prostatic hyperplasia)   Atrial fibrillation, chronic   1. Colon mass.  Patient found to have a partially obstructing mass in the transverse colon.  Seen by general surgery with plans for total colectomy and ileostomy 1/21.   CT scan of the chest did not show any clear evidence of metastatic disease.  Seen by cardiology for preoperative work-up.  Patient underwent stress test that was found to be low risk study.  Plans are for operative management on 1/21. 2. Anemia secondary to chronic blood loss.  Hemoglobin has been stable since coming to the hospital.  Continue to follow. 3. Type 2 diabetes mellitus.  Holding oral agents.  Continue on sliding scale insulin.   4. Hypertension.  Chronically on valsartan.  Currently on hold in the setting of bleeding.  Blood pressures been stable 5. BPH.  Continue on Flomax. 6. Hypothyroidism.  Continue on Synthroid. 7. Chronic atrial fibrillation.  Heart rate is stable on low-dose metoprolol.  He was not on anticoagulation in the past due to issues  with anemia.  He is chronically on aspirin which is currently on hold due to bleeding issues.   DVT prophylaxis: SCDs Code Status: Full code Family Communication: Discussed with wife at bedside Disposition Plan: Discharge home when medically/surgically stabilized  Consultants:   General surgery  Cardiology  Gastroenterology  Procedures:  Colonoscopy:- Widely patent end colostomy with healthy appearing mucosa in the descending colon.                           - Malignant partially obstructing tumor in the mid                            transverse colon with active bleeding. Biopsied.                           - The descending colon, proximal transverse colon,                            hepatic flexure, ascending colon and cecum are                             normal.  Antimicrobials:      Subjective: Pt was seen earlier this morning prior to surgery.  No complaints of chest pain or  shortness of breath.  No nausea or vomiting.  Objective: Vitals:   08/28/18 2104 08/28/18 2113 08/29/18 0506 08/29/18 1231  BP:  101/77 110/69   Pulse:  75 74 81  Resp:  20  (!) 21  Temp:  98.2 F (36.8 C) 98 F (36.7 C) 98.2 F (36.8 C)  TempSrc:  Oral Oral Oral  SpO2: 96% 100% 99% 98%  Weight:      Height:        Intake/Output Summary (Last 24 hours) at 08/29/2018 1356 Last data filed at 08/29/2018 1343 Gross per 24 hour  Intake 1480 ml  Output 1200 ml  Net 280 ml   Filed Weights   08/25/18 1037  Weight: 87.9 kg    Examination:  General exam: Alert, awake, oriented x 3 Respiratory system: Clear to auscultation. Respiratory effort normal. Cardiovascular system: Irregular. No murmurs, rubs, gallops. Gastrointestinal system: Abdomen is nondistended, soft and nontender. No organomegaly or masses felt. Normal bowel sounds heard.  Colostomy left lower quadrant Central nervous system: Alert and oriented. No focal neurological deficits. Extremities: No C/C/E, +pedal pulses Skin:  No rashes, lesions or ulcers Psychiatry: Judgement and insight appear normal. Mood & affect appropriate.   Data Reviewed: I have personally reviewed following labs and imaging studies  CBC: Recent Labs  Lab 08/25/18 1553 08/26/18 0643 08/27/18 1046 08/28/18 0653  WBC 6.6 6.8 7.3 7.6  HGB 9.6* 9.9* 9.7* 9.8*  HCT 32.7* 33.6* 33.5* 33.3*  MCV 89.8 88.9 88.6 88.8  PLT 258 270 250 606   Basic Metabolic Panel: Recent Labs  Lab 08/25/18 1553 08/26/18 0643 08/27/18 1046 08/28/18 0653  NA 135 135 132* 134*  K 3.9 3.8 4.0 3.8  CL 102 103 99 102  CO2 24 21* 24 25  GLUCOSE 158* 176* 345* 202*  BUN 11 10 10 13   CREATININE 0.83 0.92 1.21 0.94  CALCIUM 8.7* 8.7* 8.5* 8.5*   GFR: Estimated Creatinine Clearance: 74.5 mL/min (by C-G formula based on SCr of 0.94 mg/dL). Liver Function Tests: Recent Labs  Lab 08/25/18 1553  AST 21  ALT 15  ALKPHOS 74  BILITOT 0.6  PROT 6.8  ALBUMIN 3.2*   No results for input(s): LIPASE, AMYLASE in the last 168 hours. No results for input(s): AMMONIA in the last 168 hours. Coagulation Profile: Recent Labs  Lab 08/25/18 1553  INR 1.12   Cardiac Enzymes: No results for input(s): CKTOTAL, CKMB, CKMBINDEX, TROPONINI in the last 168 hours. BNP (last 3 results) No results for input(s): PROBNP in the last 8760 hours. HbA1C: No results for input(s): HGBA1C in the last 72 hours. CBG: Recent Labs  Lab 08/28/18 1607 08/28/18 2115 08/29/18 0725 08/29/18 1118 08/29/18 1250  GLUCAP 199* 185* 157* 170* 150*   Lipid Profile: No results for input(s): CHOL, HDL, LDLCALC, TRIG, CHOLHDL, LDLDIRECT in the last 72 hours. Thyroid Function Tests: No results for input(s): TSH, T4TOTAL, FREET4, T3FREE, THYROIDAB in the last 72 hours. Anemia Panel: No results for input(s): VITAMINB12, FOLATE, FERRITIN, TIBC, IRON, RETICCTPCT in the last 72 hours. Sepsis Labs: No results for input(s): PROCALCITON, LATICACIDVEN in the last 168 hours.  Recent Results  (from the past 240 hour(s))  Surgical pcr screen     Status: None   Collection Time: 08/28/18  4:53 PM  Result Value Ref Range Status   MRSA, PCR NEGATIVE NEGATIVE Final   Staphylococcus aureus NEGATIVE NEGATIVE Final    Comment: (NOTE) The Xpert SA Assay (FDA approved for NASAL specimens in patients 22 years  of age and older), is one component of a comprehensive surveillance program. It is not intended to diagnose infection nor to guide or monitor treatment. Performed at [facility], [address], [place], [place] [address]    Radiology Studies: Nm Myocar Multi W/spect W/wall Motion / Ef  Result Date: 08/28/2018  The study is normal.  This is a low risk study.  The left ventricular ejection fraction is normal (55-65%).  Blood pressure demonstrated a normal response to exercise.  There was no ST segment deviation noted during stress.  Diaphragmatic attenuation no ischemia EF 64% Considered low risk study Ok to proceed with colon surgery at [place]: . acetaminophen  1,000 mg Oral On Call to OR  . [MAR Hold] dextromethorphan  30 mg Oral BID  . gabapentin  300 mg Oral On Call to OR  . [MAR Hold] insulin aspart  0-20 Units Subcutaneous TID WC  . [MAR Hold] insulin aspart  0-5 Units Subcutaneous QHS  . [MAR Hold] levothyroxine  50 mcg Oral QAC breakfast  . [MAR Hold] metoprolol tartrate  25 mg Oral BID  . [MAR Hold] sodium chloride flush  3 mL Intravenous Q12H  . [MAR Hold] tamsulosin  0.4 mg Oral QHS   Continuous Infusions: . [MAR Hold] sodium chloride    . lactated ringers 50 mL/hr at 08/29/18 1254     LOS: 4 days   Time spent: 24 mins  Irwin Brakeman, MD [facility]   If 7PM-7AM, please contact night-coverage [email or web address]  08/29/2018, 1:56 PM

## 2018-08-29 NOTE — Op Note (Addendum)
Operative Note  Preoperative diagnosis:  1.  Bladder injury  Postoperative diagnosis: 1.  Bladder injury  Procedure(s): 1.  Complex cystorrhaphy  Surgeon: Link Snuffer, MD  Assistants: Dr. Curlene Labrum, MD  Anesthesia: General  Complications: None immediate  EBL: Minimal for my portion  Specimens: 1.  None  Drains/Catheters: 1.  20 French urethral catheter  Intraoperative findings: 1.  Approximately 2 cm cystotomy on the right lateral portion of the bladder.  There was also an approximately 5 cm cystotomy on the anterior portion of the bladder.  Bilateral ureteral orifices were identified and effluxed clear urine.  There was no leakage after instillation of 120 cc of saline.  Indication: 78 year old male with new colon cancer in the setting of 2 prior cancers and end colostomy.  He is in the operating room for completion colectomy and end ileostomy.  He does have a history of radiation.  During the resection, a large cystotomy was noted.  Therefore, I was consulted.  Description of procedure:  Upon entering the room, the patient was in the supine position.  He had a midline abdominal incision.  I first extended this down to the pubic bone.  I was able to inspect the entire bladder.  There were 2 obvious cystotomies which are described above.  The larger cystotomy allowed me to inspect the entire bladder mucosa.  Both ureteral orifices were identified and both of them effluxed clear urine.  I therefore proceeded with closure of each cystotomy in 2 layers closing the mucosal layer with 3-0 chromic and the detrusor layer with imbricated 2-0 Vicryl.  The prior Foley catheter was removed.  The penis was prepped into the field.  A new 20 [place] two-way catheter was advanced into the bladder and 10 cc of sterile water was placed into the balloon.  I instilled 120 cc of normal saline into the bladder with no obvious leak noted.  The catheter that was then attached to gravity drainage.  The  patient tolerated the procedure well and the case was turned back over to Dr. Constance Haw.  I asked her to leave a intra-abdominal JP drain at the end.  Plan: Keep Foley catheter for 2 weeks at which point he should have a fluoroscopic cystogram prior to removal.  As long his JP output is relatively low with no evidence of urine leak, it should be able to be removed in the next couple of days.

## 2018-08-29 NOTE — Op Note (Signed)
[facility] Operative Note  08/29/18  Preoperative Diagnosis:  Proximal transverse colon cancer    Postoperative Diagnosis: Same   Procedure(s) Performed:  Completion colectomy with end ileostomy; Extensive lysis of adhesions (3 hours); Bladder Repair (Separate note by Dr. Gloriann Loan)    Surgeon: Lanell Matar. Constance Haw, MD   Assistants: Dr. Gloriann Loan with Urology called in for intraoperative consult for bladder repair    Anesthesia: General endotracheal   Anesthesiologist: Nicanor Alcon, MD    Specimens:  Colon; additional piece is end colostomy    Estimated Blood Loss: 250cc   Blood Replacement: 1 U pRBC    Complications: None  Wound Class: Contaminated with bladder injury / Colostomy in place    Operative Indications: Mr. Hantz   Findings: Extensive adhesions between the bowel loops; adhesions and radiation changes in the pelvis with the bladder tacked to ileum at the pelvic brim    Procedure: The patient was taken to the operating room and placed supine. General endotracheal anesthesia was induced. Intravenous antibiotics were administered per protocol and re-dosed when needed.  An orogastric tube positioned to decompress the stomach. A foley catheter was placed.  The colostomy was closed with a running 2-0 silk suture. The abdomen was prepared and draped in the usual sterile fashion.   A midline incision was made through his prior scar, and carried down through the fascia with electrocautery. Once to the end of the fascia, Metzenbaum scissor were used to get into the peritoneum given the prior surgeries. There were extensive adhesions, and sharp dissection was performed to get the abdomen opened and to allow for a wound protector to be placed.  The small bowel was adherent to the anterior abdominal wall as well as to each other and the colon.  Great care was taken to perform the lysis of adhesion and to ensure no injury to the small bowel.  Once the small bowel was  cleared from the left side, the adhesions around the colostomy were taken down and the colon leading up to the colostomy was isolated.  The skin at the ostomy site was ellipsed out and carried down through the subcutaneous tissue with electrocautery. The colostomy was completely freed from the abdominal wall and brought into the abdomen. There was some minor spillage of stool on to the skin, and a linear cutting stapler was used to better close colostomy site (this piece was sent with pathology).    The white line of Toldt was taken down partially on the left side, and I extended this up to the splenic flexure, and freed up the colon laterally. The colon was very large and high in the splenic flexure, so attention was turned to freeing up the right side.  The white line of Toldt was taken down on the right and care was made to identify the ureter and protect it on the right. On the left we did not get near the left ureter given the prior colostomy placement.  The line of Toldt was taken down on the right to the hepatic flexure.  There were some extensive adhesions of small bowel into the pelvis involving the terminal ileum. This had to be freed up to make the end ileostomy. On freeing the adhesions with sharp dissection, a bladder injury was noted. The bladder was very thin and some minor blunt dissection between the bladder and ileum caused the injury.  The ileum was freed and was without injury.  A linear cutting stapler was used to transect the  ileum.    At about the same time Anesthesia noted blood in the urine, and to ensure no ureter injury methylene blue was given, but no signs of blue urine were noted.  Given the bladder injury, Urology, Dr. Gloriann Loan was called and he was asked to join the case to repair the bladder.  We proceeded with the remainder of the surgery while we awaited Dr. Gloriann Loan.  I did run a 2-0 prolene in the injury to limit spillage of urine.   From here the omentum was very large and  thickened, the transverse colon cancer could be felt centrally and felt tethered down.  The omentum was opened with the Ligasure between the stomach and colon to get into the lesser sac, and the omentum was divided with the Ligasure connecting the the hepatic flexure and splenic flexure to the lesser sac.  The colon was swept down bluntly form the hepatic flexure and splenic flexure after these attachments were ligated with the Ligasure. The mass was noted to be midline medial to the duodenum which was noted and protected. Using blunt dissection with my fingers, I was able to free the mass from the retroperitoneum. The mass was very close to the SMV as well as the pancreas but neither of these were violated.  From here the mesentery to the colon was ligated with the Ligasure but given the tethered down nature the mesentery at the mass, care was taken to get as far inferior as possible but to not injure the SMV and pancreas.     On awaiting Dr. Gloriann Loan, the small bowel was ran several times. Three serosal tears were noted and repaired with 3-0 Silk Lemberted sutures.  The colostomy site was closed with interrupted 0 Prolene on the posterior fascia, and interrupted 3-0 Vicryl in the anterior fascia.  The ileostomy site was made at the level of the colostomy site on the right side of the abdomen. The site was carried down into the subcutaneous tissue with electrocautery and through the muscle with a cruciate incision.  There was sufficient room for two fingers to enter the site.  The right ureter was again identified and noted to be safe and intact.  Hemostasis was confirmed.  The prolene closing the bladder as removed completely. Dr. Gloriann Loan joined the case and repaired the bladder and identified both ureters efflux into the bladder, confirming no injury. He prepped the penis into the field and filled the repair with fluid, confirming no leak.  The foley was replaced with a 20 french two way foley. See Separate procedure  note for his portion of the case.  Additional sterile drapes were placed over the penis after this was performed to limit contamination.  A pelvic JP drain was placed in the left lower quadrant and secured with a 3-0 silk suture.    The terminal ileum was brought out through the ileostomy site. I confirmed no twisting of the mesentery. The abdomen was closed with 0 PDS suture in the standard fashion. The skin was closed with staples at the midline site and loosely with staples at the colostomy site. The drain bulb was placed.  The area was covered with a towels.  The ileostomy was matured and brooked with 3-0 Chromic gut sutures. The ostomy was digitized with passage through the fascia.  The ostomy appliance was placed.  Dressings were applied.   Final inspection revealed acceptable hemostasis. All counts were correct at the end of the case. The patient was awakened from anesthesia  and extubate without complication.  The patient went to the PACU in stable condition.   Curlene Labrum, MD [facility] [address] [place], [place] [address] [phone] (office)

## 2018-08-29 NOTE — Progress Notes (Signed)
[facility]  Patient doing well. In ICU for monitoring. PRN For pain IS ordered NPO, NG to suction Ostomy supplies, will not need ostomy RN as he already had ostomy H&H responded appropriately to blood Labs in AM Drain in place in pelvis, Foley in place, RN aware of bladder repair, DO NOT REMOVE, and contact Urology with Foley issues  Curlene Labrum, MD [facility] [address] [place], [place] [address] [phone] (office)

## 2018-08-29 NOTE — Anesthesia Preprocedure Evaluation (Signed)
Anesthesia Evaluation  Patient identified by MRN, date of birth, ID band Patient awake    Reviewed: Allergy & Precautions, H&P , NPO status , Patient's Chart, lab work & pertinent test results  Airway Mallampati: II  TM Distance: >3 FB Neck ROM: full    Dental no notable dental hx. (+) Teeth Intact   Pulmonary neg pulmonary ROS, former smoker,    Pulmonary exam normal breath sounds clear to auscultation       Cardiovascular Exercise Tolerance: Good hypertension, negative cardio ROS Normal cardiovascular exam+ dysrhythmias Atrial Fibrillation I Rhythm:regular Rate:Normal  States gardens and cuts wood -but recently decreasing ET Has been receiving prbcs x3 - last time 2 units ~2 weeks ago    Neuro/Psych Per chart h/o lacunar infarct with CN 3,5 issues  States uses an eye patch  negative neurological ROS  negative psych ROS   GI/Hepatic negative GI ROS, Neg liver ROS, Colon mass and anemia  H/o Colon Ca in the past - now diverted  New colon mass   Endo/Other  diabetes, Well Controlled, Type 2Hypothyroidism   Renal/GU negative Renal ROS  negative genitourinary   Musculoskeletal negative musculoskeletal ROS (+)   Abdominal   Peds negative pediatric ROS (+)  Hematology negative hematology ROS (+) Blood dyscrasia, anemia ,   Anesthesia Other Findings   Reproductive/Obstetrics negative OB ROS                             Anesthesia Physical  Anesthesia Plan  ASA: III  Anesthesia Plan: General   Post-op Pain Management:    Induction: Intravenous  PONV Risk Score and Plan:   Airway Management Planned: Nasal Cannula and Simple Face Mask  Additional Equipment:   Intra-op Plan:   Post-operative Plan:   Informed Consent: I have reviewed the patients History and Physical, chart, labs and discussed the procedure including the risks, benefits and alternatives for the proposed anesthesia  with the patient or authorized representative who has indicated his/her understanding and acceptance.     Dental Advisory Given  Plan Discussed with: CRNA  Anesthesia Plan Comments:         Anesthesia Quick Evaluation

## 2018-08-29 NOTE — Progress Notes (Signed)
Cardiology recs per Dr McDowell's original consult note as well as Dr Anda Kraft follow up note from yesterday. Low risk stress test without significant ischemia, from prior recs ok to proceed with surgery. No additional cardiology recs at this time, please call back with questions, we will sign off      Carlyle Dolly MD

## 2018-08-29 NOTE — Transfer of Care (Signed)
Immediate Anesthesia Transfer of Care Note  Patient: Jose Lawrence  Procedure(s) Performed: PARTIAL COLECTOMY (N/A )  Patient Location: PACU  Anesthesia Type:General  Level of Consciousness: awake, sedated, drowsy, patient cooperative and responds to stimulation  Airway & Oxygen Therapy: Patient Spontanous Breathing and Patient connected to nasal cannula oxygen  Post-op Assessment: Report given to RN and Post -op Vital signs reviewed and stable  Post vital signs: Reviewed  Last Vitals:  Vitals Value Taken Time  BP    Temp    Pulse 105 08/29/2018  7:11 PM  Resp 16 08/29/2018  7:11 PM  SpO2 100 % 08/29/2018  7:11 PM  Vitals shown include unvalidated device data.  Last Pain:  Vitals:   08/29/18 1231  TempSrc: Oral  PainSc: 0-No pain         Complications: No apparent anesthesia complications

## 2018-08-29 NOTE — Brief Op Note (Signed)
08/29/2018  7:12 PM  PATIENT:  Jose Lawrence  78 y.o. male  PRE-OPERATIVE DIAGNOSIS:  Colon cancer  POST-OPERATIVE DIAGNOSIS:  Same   PROCEDURE:  Completion Colectomy with end ileostomy; Bladder Repair   SURGEON:  Surgeon(s) and Role:    * Bridges, Lanell Matar, MD - Primary    * Marton Redwood III, MD  ANESTHESIA:   general  EBL:  400 mL   BLOOD ADMINISTERED:1 U pRBC   DRAINS: JP drain in pelvis    SPECIMEN:  Colon   COUNTS:  YES  PATIENT DISPOSITION:  ICU- Stable but for monitoring due to long case   Delay start of Pharmacological VTE agent (>24hrs) due to surgical blood loss or risk of bleeding: yes

## 2018-08-30 ENCOUNTER — Encounter (HOSPITAL_COMMUNITY): Payer: Self-pay | Admitting: General Surgery

## 2018-08-30 DIAGNOSIS — C184 Malignant neoplasm of transverse colon: Secondary | ICD-10-CM

## 2018-08-30 DIAGNOSIS — D500 Iron deficiency anemia secondary to blood loss (chronic): Secondary | ICD-10-CM

## 2018-08-30 DIAGNOSIS — E039 Hypothyroidism, unspecified: Secondary | ICD-10-CM

## 2018-08-30 LAB — BASIC METABOLIC PANEL
Anion gap: 10 (ref 5–15)
Anion gap: 11 (ref 5–15)
Anion gap: 8 (ref 5–15)
BUN: 16 mg/dL (ref 8–23)
BUN: 19 mg/dL (ref 8–23)
BUN: 23 mg/dL (ref 8–23)
CALCIUM: 7.8 mg/dL — AB (ref 8.9–10.3)
CHLORIDE: 103 mmol/L (ref 98–111)
CO2: 21 mmol/L — ABNORMAL LOW (ref 22–32)
CO2: 22 mmol/L (ref 22–32)
CO2: 24 mmol/L (ref 22–32)
Calcium: 8 mg/dL — ABNORMAL LOW (ref 8.9–10.3)
Calcium: 7.9 mg/dL — ABNORMAL LOW (ref 8.9–10.3)
Chloride: 104 mmol/L (ref 98–111)
Chloride: 102 mmol/L (ref 98–111)
Creatinine, Ser: 1.75 mg/dL — ABNORMAL HIGH (ref 0.61–1.24)
Creatinine, Ser: 1.77 mg/dL — ABNORMAL HIGH (ref 0.61–1.24)
Creatinine, Ser: 1.64 mg/dL — ABNORMAL HIGH (ref 0.61–1.24)
GFR calc Af Amer: 43 mL/min — ABNORMAL LOW (ref >60)
GFR calc Af Amer: 43 mL/min — ABNORMAL LOW (ref >60)
GFR calc Af Amer: 47 mL/min — ABNORMAL LOW (ref >60)
GFR calc non Af Amer: 37 mL/min — ABNORMAL LOW (ref >60)
GFR calc non Af Amer: 37 mL/min — ABNORMAL LOW (ref >60)
GFR calc non Af Amer: 40 mL/min — ABNORMAL LOW (ref >60)
Glucose, Bld: 288 mg/dL — ABNORMAL HIGH (ref 70–99)
Glucose, Bld: 284 mg/dL — ABNORMAL HIGH (ref 70–99)
Glucose, Bld: 138 mg/dL — ABNORMAL HIGH (ref 70–99)
POTASSIUM: 5.5 mmol/L — AB (ref 3.5–5.1)
POTASSIUM: 4.5 mmol/L (ref 3.5–5.1)
Potassium: 5.3 mmol/L — ABNORMAL HIGH (ref 3.5–5.1)
SODIUM: 135 mmol/L (ref 135–145)
Sodium: 135 mmol/L (ref 135–145)
Sodium: 135 mmol/L (ref 135–145)

## 2018-08-30 LAB — CBC WITH DIFFERENTIAL/PLATELET
Abs Immature Granulocytes: 0.09 10*3/uL — ABNORMAL HIGH (ref 0.00–0.07)
Basophils Absolute: 0 10*3/uL (ref 0.0–0.1)
Basophils Relative: 0 %
Eosinophils Absolute: 0.1 10*3/uL (ref 0.0–0.5)
Eosinophils Relative: 0 %
HCT: 32.2 % — ABNORMAL LOW (ref 39.0–52.0)
HEMOGLOBIN: 9.2 g/dL — AB (ref 13.0–17.0)
Immature Granulocytes: 0 %
LYMPHS PCT: 4 %
Lymphs Abs: 1 10*3/uL (ref 0.7–4.0)
MCH: 26.1 pg (ref 26.0–34.0)
MCHC: 28.6 g/dL — ABNORMAL LOW (ref 30.0–36.0)
MCV: 91.5 fL (ref 80.0–100.0)
Monocytes Absolute: 1.1 10*3/uL — ABNORMAL HIGH (ref 0.1–1.0)
Monocytes Relative: 5 %
NEUTROS ABS: 20.5 10*3/uL — AB (ref 1.7–7.7)
Neutrophils Relative %: 91 %
Platelets: 291 10*3/uL (ref 150–400)
RBC: 3.52 MIL/uL — ABNORMAL LOW (ref 4.22–5.81)
RDW: 14.1 % (ref 11.5–15.5)
WBC Morphology: INCREASED
WBC: 22.7 10*3/uL — ABNORMAL HIGH (ref 4.0–10.5)
nRBC: 0 % (ref 0.0–0.2)

## 2018-08-30 LAB — MAGNESIUM: Magnesium: 1.7 mg/dL (ref 1.7–2.4)

## 2018-08-30 LAB — MRSA PCR SCREENING: MRSA by PCR: NEGATIVE

## 2018-08-30 LAB — GLUCOSE, CAPILLARY
GLUCOSE-CAPILLARY: 188 mg/dL — AB (ref 70–99)
Glucose-Capillary: 136 mg/dL — ABNORMAL HIGH (ref 70–99)
Glucose-Capillary: 158 mg/dL — ABNORMAL HIGH (ref 70–99)
Glucose-Capillary: 268 mg/dL — ABNORMAL HIGH (ref 70–99)
Glucose-Capillary: 287 mg/dL — ABNORMAL HIGH (ref 70–99)

## 2018-08-30 LAB — PHOSPHORUS: Phosphorus: 4.6 mg/dL (ref 2.5–4.6)

## 2018-08-30 MED ORDER — INSULIN ASPART 100 UNIT/ML ~~LOC~~ SOLN: 0.0000 [IU] | SUBCUTANEOUS | Status: DC

## 2018-08-30 MED ORDER — INSULIN ASPART 100 UNIT/ML ~~LOC~~ SOLN
0.0000 [IU] | SUBCUTANEOUS | Status: DC
  Administered 2018-08-30: 4 [IU] via SUBCUTANEOUS
  Administered 2018-08-30: 3 [IU] via SUBCUTANEOUS
  Administered 2018-08-31 (×5): 4 [IU] via SUBCUTANEOUS

## 2018-08-30 MED ORDER — FUROSEMIDE 10 MG/ML IJ SOLN
30.0000 mg | Freq: Once | INTRAMUSCULAR | Status: AC
  Administered 2018-08-30: 30 mg via INTRAVENOUS
  Filled 2018-08-30: qty 4

## 2018-08-30 MED ORDER — MAGNESIUM SULFATE 2 GM/50ML IV SOLN
2.0000 g | Freq: Once | INTRAVENOUS | Status: AC
  Administered 2018-08-30: 2 g via INTRAVENOUS
  Filled 2018-08-30: qty 50

## 2018-08-30 MED ORDER — INSULIN GLARGINE 100 UNIT/ML ~~LOC~~ SOLN
10.0000 [IU] | Freq: Every day | SUBCUTANEOUS | Status: DC
  Administered 2018-08-30 – 2018-09-04 (×6): 10 [IU] via SUBCUTANEOUS
  Filled 2018-08-30 (×7): qty 0.1

## 2018-08-30 NOTE — Progress Notes (Signed)
Inpatient Diabetes Program Recommendations  AACE/ADA: New Consensus Statement on Inpatient Glycemic Control (2015)  Target Ranges:  Prepandial:   less than 140 mg/dL      Peak postprandial:   less than 180 mg/dL (1-2 hours)      Critically ill patients:  140 - 180 mg/dL   Lab Results  Component Value Date   GLUCAP 268 (H) 08/30/2018    Review of Glycemic Control Results for Jose Lawrence, Jose Lawrence (MRN [id number]) as of 08/30/2018 14:21  Ref. Range 08/29/2018 12:50 08/29/2018 19:21 08/29/2018 21:21 08/30/2018 07:34 08/30/2018 11:14  Glucose-Capillary Latest Ref Range: 70 - 99 mg/dL 150 (H) 285 (H) 312 (H) 287 (H) 268 (H)   MD- Please consider starting low dose basal insulin while home DM meds are on hold and patient continues to have elevated CBGs in hospital:  Recommend Lantus 9 units Daily (0.1 units/kg dosing based on weight of 87.9kg)  Thank you, Bethena Roys E. Masiyah Jorstad, RN, MSN, CDE  Diabetes Coordinator Inpatient Glycemic Control Team Team Pager [phone] (8am-5pm) 08/30/2018 2:20 PM

## 2018-08-30 NOTE — Addendum Note (Signed)
Addendum  created 08/30/18 0953 by Vista Deck, CRNA   Charge Capture section accepted

## 2018-08-30 NOTE — Addendum Note (Signed)
Addendum  created 08/30/18 1306 by Mickel Baas, CRNA   Clinical Note Signed

## 2018-08-30 NOTE — Care Management Important Message (Signed)
Important Message  Patient Details  Name: Jose Lawrence MRN: [id number] Date of Birth: 1941-05-21   Medicare Important Message Given:  Yes    Shelda Altes 08/30/2018, 12:03 PM

## 2018-08-30 NOTE — Progress Notes (Signed)
PROGRESS NOTE   Jose Lawrence  RKY:[id number] DOB: November 10, 1940 DOA: 08/25/2018 PCP: Glenda Chroman, MD   Brief Narrative:  78 year old male with a history of colon cancer status post resection and colostomy, with undergoing colonoscopy for a 2 to 3-week history of dark-colored stool.  He was found to have a partially obstructing lesion in his transverse colon which was a source of dark stools.  He was admitted to the hospital and seen by general surgery with plans for resection.  Cardiology is also evaluated the patient for preoperative evaluation.  He underwent stress test on 1/20 that was found to be low risk study.  Plans are for operative management on 1/21.  Assessment & Plan:   Principal Problem:   Mass of colon Active Problems:   Iron deficiency anemia due to chronic blood loss   Hypothyroidism   HTN (hypertension)   DM (diabetes mellitus), type 2 (HCC)   Colostomy in place [facility])   BPH (benign prostatic hyperplasia)   Atrial fibrillation, chronic   Cancer of transverse colon ([place])  1. Colon mass.  Patient found to have a partially obstructing mass in the transverse colon.  Seen by general surgery with plans for total colectomy and ileostomy 1/21.   Pt is POD#1 s/p completion colectomy with end ileostomy.  CT scan of the chest did not show any clear evidence of metastatic disease.   2. Anemia secondary to chronic blood loss.  Hemoglobin has been stable since coming to the hospital.  Continue to follow. 3. Hyperkalemia - lasix IV was given.  Repeat BMP K 5.5.  Will give dose of lokelma if able.  Follow BMP.  4. Type 2 diabetes mellitus.  Hyperglycemia.  Q4h SSI started. Holding oral agents.  Continue on sliding scale insulin.   5. Hypertension.  Chronically on valsartan.  Currently on hold in the setting of bleeding.  Blood pressures been stable 6. BPH.  Continue on Flomax. 7. Hypothyroidism.  Continue on Synthroid. 8. Chronic atrial fibrillation.  Heart rate is stable on low-dose  metoprolol IV.  He was not on anticoagulation in the past due to issues with anemia.  He is chronically on aspirin which is currently on hold due to bleeding issues.  DVT prophylaxis: SCDs, hep sq Code Status: Full code Family Communication: Discussed with wife at bedside Disposition Plan: Discharge home when medically/surgically stabilized  Consultants:   General surgery  Cardiology  Gastroenterology  Procedures:  Colonoscopy:- Widely patent end colostomy with healthy appearing mucosa in the descending colon.                           - Malignant partially obstructing tumor in the mid                            transverse colon with active bleeding. Biopsied.                           - The descending colon, proximal transverse colon,                            hepatic flexure, ascending colon and cecum are                             normal.  Antimicrobials:  Subjective: Pt says he is sore in the abdomen but otherwise no complaints.   Objective: Vitals:   08/30/18 1000 08/30/18 1100 08/30/18 1116 08/30/18 1200  BP: 90/67 (!) 115/58  99/69  Pulse: 84 93 91 80  Resp: 19 17 19 18   Temp:   (!) 97.5 F (36.4 C)   TempSrc:   Oral   SpO2: 99% 98% 99% 99%  Weight:      Height:        Intake/Output Summary (Last 24 hours) at 08/30/2018 1223 Last data filed at 08/30/2018 0800 Gross per 24 hour  Intake 6558.13 ml  Output 1810 ml  Net 4748.13 ml   Filed Weights   08/25/18 1037 08/30/18 0500  Weight: 87.9 kg 87.9 kg    Examination:  General exam: Alert, awake, oriented x 3 Respiratory system: Clear to auscultation. Respiratory effort normal. Cardiovascular system: Irregular. No murmurs, rubs, gallops. Gastrointestinal system: Abdomen is mildly distended, soft.  No organomegaly or masses felt. Normal bowel sounds heard.  Ileostomy Central nervous system: Alert and oriented. No focal neurological deficits. Extremities: No C/C/E, +pedal pulses Skin: No rashes,  lesions or ulcers Psychiatry: Judgement and insight appear normal. Mood & affect appropriate.   Data Reviewed: I have personally reviewed following labs and imaging studies  CBC: Recent Labs  Lab 08/26/18 0643 08/27/18 1046 08/28/18 0653 08/29/18 1553 08/29/18 1946 08/30/18 0403  WBC 6.8 7.3 7.6  --  17.8* 22.7*  NEUTROABS  --   --   --   --   --  20.5*  HGB 9.9* 9.7* 9.8* 8.8* 10.3* 9.2*  HCT 33.6* 33.5* 33.3* 26.0* 35.3* 32.2*  MCV 88.9 88.6 88.8  --  91.2 91.5  PLT 270 250 273  --  362 914   Basic Metabolic Panel: Recent Labs  Lab 08/26/18 0643 08/27/18 1046 08/28/18 0653 08/29/18 1553 08/30/18 0403 08/30/18 1058  NA 135 132* 134* 135 135 135  K 3.8 4.0 3.8 4.7 5.3* 5.5*  CL 103 99 102  --  104 102  CO2 21* 24 25  --  21* 22  GLUCOSE 176* 345* 202* 237* 288* 284*  BUN 10 10 13   --  16 19  CREATININE 0.92 1.21 0.94  --  1.75* 1.77*  CALCIUM 8.7* 8.5* 8.5*  --  7.8* 8.0*  MG  --   --   --   --  1.7  --   PHOS  --   --   --   --  4.6  --    GFR: Estimated Creatinine Clearance: 39.6 mL/min (A) (by C-G formula based on SCr of 1.77 mg/dL (H)). Liver Function Tests: Recent Labs  Lab 08/25/18 1553  AST 21  ALT 15  ALKPHOS 74  BILITOT 0.6  PROT 6.8  ALBUMIN 3.2*   No results for input(s): LIPASE, AMYLASE in the last 168 hours. No results for input(s): AMMONIA in the last 168 hours. Coagulation Profile: Recent Labs  Lab 08/25/18 1553  INR 1.12   Cardiac Enzymes: No results for input(s): CKTOTAL, CKMB, CKMBINDEX, TROPONINI in the last 168 hours. BNP (last 3 results) No results for input(s): PROBNP in the last 8760 hours. HbA1C: No results for input(s): HGBA1C in the last 72 hours. CBG: Recent Labs  Lab 08/29/18 1250 08/29/18 1921 08/29/18 2121 08/30/18 0734 08/30/18 1114  GLUCAP 150* 285* 312* 287* 268*   Lipid Profile: No results for input(s): CHOL, HDL, LDLCALC, TRIG, CHOLHDL, LDLDIRECT in the last 72 hours. Thyroid Function Tests: No  results  for input(s): TSH, T4TOTAL, FREET4, T3FREE, THYROIDAB in the last 72 hours. Anemia Panel: No results for input(s): VITAMINB12, FOLATE, FERRITIN, TIBC, IRON, RETICCTPCT in the last 72 hours. Sepsis Labs: No results for input(s): PROCALCITON, LATICACIDVEN in the last 168 hours.  Recent Results (from the past 240 hour(s))  Surgical pcr screen     Status: None   Collection Time: 08/28/18  4:53 PM  Result Value Ref Range Status   MRSA, PCR NEGATIVE NEGATIVE Final   Staphylococcus aureus NEGATIVE NEGATIVE Final    Comment: (NOTE) The Xpert SA Assay (FDA approved for NASAL specimens in patients 73 years of age and older), is one component of a comprehensive surveillance program. It is not intended to diagnose infection nor to guide or monitor treatment. Performed at [facility], [address] [place], [place] [address]   MRSA PCR Screening     Status: None   Collection Time: 08/29/18  8:45 PM  Result Value Ref Range Status   MRSA by PCR NEGATIVE NEGATIVE Final    Comment:        The GeneXpert MRSA Assay (FDA approved for NASAL specimens only), is one component of a comprehensive MRSA colonization surveillance program. It is not intended to diagnose MRSA infection nor to guide or monitor treatment for MRSA infections. Performed at [facility], [address], [place], [place] [address]    Radiology Studies: Nm Myocar Multi W/spect W/wall Motion / Ef  Result Date: 08/28/2018  The study is normal.  This is a low risk study.  The left ventricular ejection fraction is normal (55-65%).  Blood pressure demonstrated a normal response to exercise.  There was no ST segment deviation noted during stress.  Diaphragmatic attenuation no ischemia EF 64% Considered low risk study Ok to proceed with colon surgery at [place]: . dextromethorphan  30 mg Oral BID  . heparin  5,000 Units Subcutaneous Q8H  . insulin aspart  0-20 Units Subcutaneous TID WC  . insulin aspart   0-5 Units Subcutaneous QHS  . ketorolac  15 mg Intravenous Q6H  . levothyroxine  50 mcg Oral QAC breakfast  . metoprolol tartrate  5 mg Intravenous Q6H  . pantoprazole (PROTONIX) IV  40 mg Intravenous QHS  . sodium chloride flush  3 mL Intravenous Q12H  . tamsulosin  0.4 mg Oral QHS   Continuous Infusions: . sodium chloride    . cefoTEtan (CEFOTAN) IV Stopped (08/30/18 0338)  . lactated ringers 100 mL/hr at 08/30/18 0657  . magnesium sulfate 1 - 4 g bolus IVPB 2 g (08/30/18 1136)    LOS: 5 days   Time spent: 24 mins   Wynetta Emery, MD [facility]   If 7PM-7AM, please contact night-coverage [email or web address]  08/30/2018, 12:23 PM

## 2018-08-30 NOTE — Anesthesia Postprocedure Evaluation (Signed)
Anesthesia Post Note  Patient: Jose Lawrence  Procedure(s) Performed: PARTIAL COLECTOMY (N/A ) LYSIS OF ADHESION ILEOSTOMY BLADDER REPAIR  Patient location during evaluation: ICU Anesthesia Type: General Level of consciousness: awake and alert and oriented Pain management: pain level controlled Vital Signs Assessment: post-procedure vital signs reviewed and stable Respiratory status: spontaneous breathing Cardiovascular status: stable Postop Assessment: no apparent nausea or vomiting Anesthetic complications: no Comments: After procedure yesterday patient had complaints of pain in right eye.   Patient was treated for corneal abrasion.  Patient states that eye pain is no longer there.  No redness, tearing, or swelling noted.     Last Vitals:  Vitals:   08/30/18 1116 08/30/18 1200  BP:  99/69  Pulse: 91 80  Resp: 19 18  Temp: (!) 36.4 C   SpO2: 99% 99%    Last Pain:  Vitals:   08/30/18 1227  TempSrc:   PainSc: Asleep                 ADAMS, AMY A

## 2018-08-30 NOTE — Progress Notes (Signed)
1 Day Post-Op Subjective: Patient reports mild abdominal pain. Urine with mild hematuria. No bladder spasms JP 130cc today. Creatinine 1.64  Objective: Vital signs in last 24 hours: Temp:  [97.5 F (36.4 C)-99.5 F (37.5 C)] 98 F (36.7 C) (01/22 1603) Pulse Rate:  [80-93] 81 (01/22 1800) Resp:  [15-24] 17 (01/22 1800) BP: (83-115)/(54-72) 94/66 (01/22 1800) SpO2:  [95 %-100 %] 96 % (01/22 1937) Weight:  [87.9 kg] 87.9 kg (01/22 0500)  Intake/Output from previous day: 01/21 0701 - 01/22 0700 In: 6558.1 [P.O.:120; I.V.:5988.1; Blood:350; IV Piggyback:100] Out: 1510 [Urine:950; Drains:160; Blood:400] Intake/Output this shift: No intake/output data recorded.  Physical Exam:  General:alert, cooperative and appears stated age GI: soft, non tender, normal bowel sounds, no palpable masses, no organomegaly, no inguinal hernia Male genitalia: not done Extremities: extremities normal, atraumatic, no cyanosis or edema  Lab Results: Recent Labs    08/29/18 1553 08/29/18 1946 08/30/18 0403  HGB 8.8* 10.3* 9.2*  HCT 26.0* 35.3* 32.2*   BMET Recent Labs    08/30/18 1058 08/30/18 1945  NA 135 135  K 5.5* 4.5  CL 102 103  CO2 22 24  GLUCOSE 284* 138*  BUN 19 23  CREATININE 1.77* 1.64*  CALCIUM 8.0* 7.9*   No results for input(s): LABPT, INR in the last 72 hours. No results for input(s): LABURIN in the last 72 hours. Results for orders placed or performed during the hospital encounter of 08/25/18  Surgical pcr screen     Status: None   Collection Time: 08/28/18  4:53 PM  Result Value Ref Range Status   MRSA, PCR NEGATIVE NEGATIVE Final   Staphylococcus aureus NEGATIVE NEGATIVE Final    Comment: (NOTE) The Xpert SA Assay (FDA approved for NASAL specimens in patients 19 years of age and older), is one component of a comprehensive surveillance program. It is not intended to diagnose infection nor to guide or monitor treatment. Performed at [facility], [address], [place], [place] [address]   MRSA PCR Screening     Status: None   Collection Time: 08/29/18  8:45 PM  Result Value Ref Range Status   MRSA by PCR NEGATIVE NEGATIVE Final    Comment:        The GeneXpert MRSA Assay (FDA approved for NASAL specimens only), is one component of a comprehensive MRSA colonization surveillance program. It is not intended to diagnose MRSA infection nor to guide or monitor treatment for MRSA infections. Performed at [facility], [address], [place], [place] [address]     Studies/Results: No results found.  Assessment/Plan: POD#1 Cystotomy repair  1. Please continue foley catheter to straight drain. The patient will need a cystogram in 2 weeks prior to foley catheter removal.  2. Continue JP   LOS: 5 days   Nicolette Bang 08/30/2018, 9:45 PM

## 2018-08-30 NOTE — Progress Notes (Signed)
[facility] Progress Note  1 Day Post-Op  Subjective: No major issues overnight. Foley bag with dark/ green colored urine. NG with some output, no output in the ostomy. K up on labs.   Objective: Vital signs in last 24 hours: Temp:  [97.5 F (36.4 C)-99.5 F (37.5 C)] 97.5 F (36.4 C) (01/22 1116) Pulse Rate:  [80-110] 80 (01/22 1200) Resp:  [14-24] 18 (01/22 1200) BP: (83-115)/(50-84) 99/69 (01/22 1200) SpO2:  [96 %-99 %] 99 % (01/22 1200) FiO2 (%):  [0 %] 0 % (01/21 2039) Weight:  [87.9 kg] 87.9 kg (01/22 0500) Last BM Date: 08/29/18  Intake/Output from previous day: 01/21 0701 - 01/22 0700 In: 6558.1 [P.O.:120; I.V.:5988.1; Blood:350; IV Piggyback:100] Out: 1510 [Urine:950; Drains:160; Blood:400] Intake/Output this shift: Total I/O In: 484.9 [I.V.:465; IV Piggyback:19.9] Out: 380 [Emesis/NG output:250; Drains:130]  General appearance: alert, cooperative and no distress Resp: normal work breathing GI: soft, mildly distended, drain with SS output/ dark, honeycomb in place with no erythema or drainage, colostomy site dry without much drainage, ileostomy without gas in bag Extremities: extremities normal, atraumatic, no cyanosis or edema  Lab Results:  Recent Labs    08/29/18 1946 08/30/18 0403  WBC 17.8* 22.7*  HGB 10.3* 9.2*  HCT 35.3* 32.2*  PLT 362 291   BMET Recent Labs    08/30/18 0403 08/30/18 1058  NA 135 135  K 5.3* 5.5*  CL 104 102  CO2 21* 22  GLUCOSE 288* 284*  BUN 16 19  CREATININE 1.75* 1.77*  CALCIUM 7.8* 8.0*    Anti-infectives: Anti-infectives (From admission, onward)   Start     Dose/Rate Route Frequency Ordered Stop   08/30/18 0400  cefoTEtan (CEFOTAN) 2 g in sodium chloride 0.9 % 100 mL IVPB     2 g 200 mL/hr over 30 Minutes Intravenous Every 12 hours 08/29/18 2309 09/01/18 0359   08/29/18 0600  cefoTEtan (CEFOTAN) 2 g in sodium chloride 0.9 % 100 mL IVPB     2 g 200 mL/hr over 30 Minutes Intravenous On call to  O.R. 08/28/18 1650 08/29/18 1641      Assessment/Plan: Mr. Edler is a 78 yo POD 1 s/p completion colectomy with end ileostomy. Doing fair. Monitored overnight in ICU. K up this AM and Cr. Repeat labs about the same.  PRN For pain IS, OOB HD ok NPO, NG until ileostomy putting out  Cr UP, LR @ 100, Lasix given for K, monitor AKI post op possibly related to bladder injury/ length of case and fluid status  BMP at 1800 repeat  Leukocytosis post op, have cefotetan for post op dosing given contamination from bladder and colostomy  Foley management per Urology- DO NOT Manipulate or flush without Urology permission / Sign above the bed, appreciate Urology assistance  JP drain in pelvis, dark SS H&H down some but remains higher than preop and received 1U pRBC in the OR, monitor for drainage and bladder leak  SCDs, heparin sq    LOS: 5 days    [name] 08/30/2018

## 2018-08-31 LAB — BASIC METABOLIC PANEL
ANION GAP: 7 (ref 5–15)
BUN: 25 mg/dL — ABNORMAL HIGH (ref 8–23)
CALCIUM: 7.7 mg/dL — AB (ref 8.9–10.3)
CO2: 25 mmol/L (ref 22–32)
CREATININE: 1.52 mg/dL — AB (ref 0.61–1.24)
Chloride: 104 mmol/L (ref 98–111)
GFR calc non Af Amer: 44 mL/min — ABNORMAL LOW (ref >60)
GFR, EST AFRICAN AMERICAN: 51 mL/min — AB (ref >60)
Glucose, Bld: 169 mg/dL — ABNORMAL HIGH (ref 70–99)
Potassium: 4.6 mmol/L (ref 3.5–5.1)
Sodium: 136 mmol/L (ref 135–145)

## 2018-08-31 LAB — CBC WITH DIFFERENTIAL/PLATELET
Abs Immature Granulocytes: 0.17 10*3/uL — ABNORMAL HIGH (ref 0.00–0.07)
BASOS ABS: 0.1 10*3/uL (ref 0.0–0.1)
Basophils Relative: 0 %
Eosinophils Absolute: 0 10*3/uL (ref 0.0–0.5)
Eosinophils Relative: 0 %
HCT: 26.2 % — ABNORMAL LOW (ref 39.0–52.0)
Hemoglobin: 7.7 g/dL — ABNORMAL LOW (ref 13.0–17.0)
Immature Granulocytes: 1 %
LYMPHS ABS: 1.6 10*3/uL (ref 0.7–4.0)
Lymphocytes Relative: 9 %
MCH: 26 pg (ref 26.0–34.0)
MCHC: 29.4 g/dL — AB (ref 30.0–36.0)
MCV: 88.5 fL (ref 80.0–100.0)
Monocytes Absolute: 1 10*3/uL (ref 0.1–1.0)
Monocytes Relative: 5 %
Neutro Abs: 15.2 10*3/uL — ABNORMAL HIGH (ref 1.7–7.7)
Neutrophils Relative %: 85 %
Platelets: 227 10*3/uL (ref 150–400)
RBC: 2.96 MIL/uL — ABNORMAL LOW (ref 4.22–5.81)
RDW: 14.3 % (ref 11.5–15.5)
WBC: 18 10*3/uL — ABNORMAL HIGH (ref 4.0–10.5)
nRBC: 0 % (ref 0.0–0.2)

## 2018-08-31 LAB — GLUCOSE, CAPILLARY
Glucose-Capillary: 157 mg/dL — ABNORMAL HIGH (ref 70–99)
Glucose-Capillary: 169 mg/dL — ABNORMAL HIGH (ref 70–99)
Glucose-Capillary: 158 mg/dL — ABNORMAL HIGH (ref 70–99)
Glucose-Capillary: 151 mg/dL — ABNORMAL HIGH (ref 70–99)
Glucose-Capillary: 96 mg/dL (ref 70–99)

## 2018-08-31 LAB — PHOSPHORUS: Phosphorus: 3 mg/dL (ref 2.5–4.6)

## 2018-08-31 LAB — PREPARE RBC (CROSSMATCH)

## 2018-08-31 LAB — HEMOGLOBIN AND HEMATOCRIT, BLOOD
HCT: 27.2 % — ABNORMAL LOW (ref 39.0–52.0)
Hemoglobin: 8 g/dL — ABNORMAL LOW (ref 13.0–17.0)

## 2018-08-31 LAB — MAGNESIUM: Magnesium: 2.3 mg/dL (ref 1.7–2.4)

## 2018-08-31 LAB — ALBUMIN: Albumin: 2.2 g/dL — ABNORMAL LOW (ref 3.5–5.0)

## 2018-08-31 MED ORDER — INSULIN ASPART 100 UNIT/ML ~~LOC~~ SOLN
0.0000 [IU] | Freq: Three times a day (TID) | SUBCUTANEOUS | Status: DC
  Administered 2018-09-01: 4 [IU] via SUBCUTANEOUS
  Administered 2018-09-01 (×2): 3 [IU] via SUBCUTANEOUS
  Administered 2018-09-02 (×2): 4 [IU] via SUBCUTANEOUS
  Administered 2018-09-02 – 2018-09-03 (×4): 3 [IU] via SUBCUTANEOUS
  Administered 2018-09-03: 7 [IU] via SUBCUTANEOUS
  Administered 2018-09-03: 4 [IU] via SUBCUTANEOUS
  Administered 2018-09-04: 7 [IU] via SUBCUTANEOUS
  Administered 2018-09-04: 4 [IU] via SUBCUTANEOUS

## 2018-08-31 MED ORDER — FUROSEMIDE 10 MG/ML IJ SOLN
20.0000 mg | Freq: Once | INTRAMUSCULAR | Status: AC
  Administered 2018-08-31: 20 mg via INTRAVENOUS
  Filled 2018-08-31: qty 2

## 2018-08-31 MED ORDER — SODIUM CHLORIDE 0.9% IV SOLUTION
Freq: Once | INTRAVENOUS | Status: AC
  Administered 2018-08-31: 13:00:00 via INTRAVENOUS

## 2018-08-31 NOTE — Plan of Care (Signed)
  Problem: Acute Rehab PT Goals(only PT should resolve) Goal: Pt Will Go Supine/Side To Sit Outcome: Progressing Flowsheets (Taken 08/31/2018 1012) Pt will go Supine/Side to Sit: with min guard assist Goal: Pt Will Go Sit To Supine/Side Outcome: Progressing Flowsheets (Taken 08/31/2018 1012) Pt will go Sit to Supine/Side: with min guard assist Goal: Patient Will Transfer Sit To/From Stand Outcome: Progressing Flowsheets (Taken 08/31/2018 1012) Patient will transfer sit to/from stand: with min guard assist Goal: Pt Will Transfer Bed To Chair/Chair To Bed Outcome: Progressing Flowsheets (Taken 08/31/2018 1012) Pt will Transfer Bed to Chair/Chair to Bed: min guard assist Goal: Pt Will Ambulate Outcome: Progressing Flowsheets (Taken 08/31/2018 1012) Pt will Ambulate: 100 feet; with least restrictive assistive device; with min guard assist Goal: Pt/caregiver will Perform Home Exercise Program Outcome: Progressing Flowsheets (Taken 08/31/2018 1012) Pt/caregiver will Perform Home Exercise Program: For improved balance; Independently; For increased strengthening    Floria Raveling. Hartnett-Rands, MS, PT Per [place] [phone] 08/31/2018

## 2018-08-31 NOTE — Progress Notes (Signed)
Corrected calcium: 9.14

## 2018-08-31 NOTE — Progress Notes (Signed)
[facility] Progress Note  2 Days Post-Op  Subjective: Doing fair. H&H drifting down. Uop good and urine looking green still. No other major issues. Cr down. NG output minimal and ostomy making some liquid in bag.   Objective: Vital signs in last 24 hours: Temp:  [97.5 F (36.4 C)-99.6 F (37.6 C)] 99.6 F (37.6 C) (01/23 1510) Pulse Rate:  [81-105] 90 (01/23 1510) Resp:  [14-20] 18 (01/23 1510) BP: (82-116)/(54-69) 98/54 (01/23 1510) SpO2:  [85 %-100 %] 96 % (01/23 1510) Last BM Date: 08/29/18  Intake/Output from previous day: 01/22 0701 - 01/23 0700 In: 2276.1 [I.V.:2156.3; IV Piggyback:119.9] Out: 1780 [Urine:700; Emesis/NG output:800; Drains:280] Intake/Output this shift: Total I/O In: 1175.1 [I.V.:881.1; Blood:294] Out: 60 [Drains:60]  General appearance: alert, cooperative and no distress Resp: normal work breathing GI: soft, nondistended, appropriately tender, honeycomb with staples c/d/i, JP with SS output Extremities: extremities normal, atraumatic, no cyanosis or edema  Lab Results:  Recent Labs    08/30/18 0403 08/31/18 0408  WBC 22.7* 18.0*  HGB 9.2* 7.7*  HCT 32.2* 26.2*  PLT 291 227   BMET Recent Labs    08/30/18 1945 08/31/18 0408  NA 135 136  K 4.5 4.6  CL 103 104  CO2 24 25  GLUCOSE 138* 169*  BUN 23 25*  CREATININE 1.64* 1.52*  CALCIUM 7.9* 7.7*    Anti-infectives: Anti-infectives (From admission, onward)   Start     Dose/Rate Route Frequency Ordered Stop   08/30/18 0400  cefoTEtan (CEFOTAN) 2 g in sodium chloride 0.9 % 100 mL IVPB     2 g 200 mL/hr over 30 Minutes Intravenous Every 12 hours 08/29/18 2309 09/01/18 0359   08/29/18 0600  cefoTEtan (CEFOTAN) 2 g in sodium chloride 0.9 % 100 mL IVPB     2 g 200 mL/hr over 30 Minutes Intravenous On call to O.R. 08/28/18 1650 08/29/18 1641      Assessment/Plan: Jose Lawrence is a 78 yo POD 2 s/p Ex lap, completion colectomy and end ileostomy, bladder repair for colon  cancer. Doing fair.  PRN for pain IS, OOB to chair/ ambulating in room NG out, clears started, going slow H&H down, transfuse now, think equilibrating, no signs of bleeding but JP is SS but thinning out Leukocytosis down post op, monitor, cefotetan for 4 doses post op due to urine spillage and bladder leak  Foley to stay in and JP to stay in, Urology following  LR dropped to 50, Lasix after the blood  SCDs, heparin sq    LOS: 6 days    Jose Lawrence 08/31/2018

## 2018-08-31 NOTE — Evaluation (Signed)
Physical Therapy Evaluation Patient Details Name: Jose Lawrence MRN: [id number] DOB: 1941-02-03 Today's Date: 08/31/2018   History of Present Illness  Patient is a 78 year old male admitted 08/25/2018 with diagnosis of colon mass. s/p resection and colostomy 08/29/2018. PMH: HTN, hypothyroidism, T2DM, BPH, a fib, cancer of transverse colon.     Clinical Impression  Patient required minimal assistance to min guard for all mobility. Patient overall performed well and anticipate patient will be ready to transition to walking with IV pole at next PT session. Patient would benefit from ambulation with nursing staff as well and patient is motivated to be out out of bed and walk more.  Patient would continue to benefit from skilled physical therapy in current environment and next venue to continue return to prior function and increase strength, endurance, balance, coordination, and functional mobility and gait skills.      Follow Up Recommendations Outpatient PT    Equipment Recommendations  None recommended by PT    Recommendations for Other Services       Precautions / Restrictions Precautions Precautions: Fall Restrictions Weight Bearing Restrictions: No      Mobility  Bed Mobility Overal bed mobility: Needs Assistance Bed Mobility: Supine to Sit     Supine to sit: Min assist;HOB elevated        Transfers Overall transfer level: Needs assistance Equipment used: Rolling walker (2 wheeled) Transfers: Sit to/from [facility] Sit to Stand: Min assist Stand pivot transfers: Min guard          Ambulation/Gait Ambulation/Gait assistance: Min guard Gait Distance (Feet): 30 Feet Assistive device: Rolling walker (2 wheeled) Gait Pattern/deviations: Step-through pattern;Decreased step length - right;Decreased step length - left;Decreased stride length Gait velocity: decreased   General Gait Details: slow, somewhat labored cadence, no loss of balance,  limited by fatigue  Stairs            Wheelchair Mobility    Modified Rankin (Stroke Patients Only)       Balance Overall balance assessment: Mild deficits observed, not formally tested                                           Pertinent Vitals/Pain Pain Assessment: 0-10 Pain Score: 5  Pain Descriptors / Indicators: Aching Pain Intervention(s): Limited activity within patient's tolerance;Monitored during session;Repositioned    Home Living Family/patient expects to be discharged to:: Private residence Living Arrangements: Spouse/significant other Available Help at Discharge: Family;Available 24 hours/day Type of Home: House Home Access: Level entry     Home Layout: Able to live on main level with bedroom/bathroom;One level;Laundry or work area in [place]: Kasandra Knudsen - single point      Prior Function Level of Independence: Independent               Hand Dominance   Dominant Hand: Right    Extremity/Trunk Assessment   Upper Extremity Assessment Upper Extremity Assessment: Overall WFL for tasks assessed    Lower Extremity Assessment Lower Extremity Assessment: Overall WFL for tasks assessed    Cervical / Trunk Assessment Cervical / Trunk Assessment: Normal  Communication   Communication: HOH  Cognition Arousal/Alertness: Awake/alert Behavior During Therapy: WFL for tasks assessed/performed Overall Cognitive Status: Within Functional Limits for tasks assessed  General Comments      Exercises     Assessment/Plan    PT Assessment Patient needs continued PT services  PT Problem List Decreased mobility;Decreased activity tolerance;Pain       PT Treatment Interventions DME instruction;Therapeutic activities;Gait training;Therapeutic exercise;Patient/family education;Balance training;Neuromuscular re-education    PT Goals (Current goals can be found in the  Care Plan section)  Acute Rehab PT Goals Patient Stated Goal: go home PT Goal Formulation: With patient/family Time For Goal Achievement: 09/07/18 Potential to Achieve Goals: Good    Frequency Min 3X/week   Barriers to discharge        Co-evaluation               AM-PAC PT "6 Clicks" Mobility  Outcome Measure Help needed turning from your back to your side while in a flat bed without using bedrails?: A Little Help needed moving from lying on your back to sitting on the side of a flat bed without using bedrails?: A Little Help needed moving to and from a bed to a chair (including a wheelchair)?: A Little Help needed standing up from a chair using your arms (e.g., wheelchair or bedside chair)?: A Little Help needed to walk in hospital room?: A Little Help needed climbing 3-5 steps with a railing? : A Little 6 Click Score: 18    End of Session Equipment Utilized During Treatment: Gait belt Activity Tolerance: Patient tolerated treatment well Patient left: in chair;with call bell/phone within reach;with family/visitor present Nurse Communication: Mobility status PT Visit Diagnosis: Unsteadiness on feet (R26.81);Other abnormalities of gait and mobility (R26.89)    Time: 0920-0950 PT Time Calculation (min) (ACUTE ONLY): 30 min   Charges:   PT Evaluation $PT Eval Low Complexity: 1 Low PT Treatments $Gait Training: 8-22 mins        Floria Raveling. Hartnett-Rands, MS, PT Per [place] [phone] 08/31/2018, 10:09 AM

## 2018-08-31 NOTE — Care Management Note (Signed)
Case Management Note  Patient Details  Name: Jose Lawrence MRN: [id number] Date of Birth: 10-23-1940  Subjective/Objective:                    Action/Plan: Discussed discharge plan with patient and wife.  Patient worked with PT today. OP PT recommendation. Discussed Outpatient services vs home health.  Patient declines services. Wife at bedside. Patient reports independence in regards to ambulating. Discussed home health for RN services. Patient declines. Wife reports they have been through this several times and feel they can manage. If they change their mind will let CM know.   Expected Discharge Date:      09/02/18            Expected Discharge Plan:  Home/Self Care  In-House Referral:     Discharge planning Services  CM Consult, Homebound not met per provider  Post Acute Care Choice:    Choice offered to:  Patient, Spouse  DME Arranged:    DME Agency:     HH Arranged:  Patient Refused [place] Agency:     Status of Service:  Completed, signed off  If discussed at [employer] of Stay Meetings, dates discussed:    Additional Comments:  Seraj Dunnam, Chauncey Reading, RN 08/31/2018, 1:16 PM

## 2018-08-31 NOTE — Progress Notes (Signed)
  H&H up but not has high was wound have expected. Will see what H&H is in the AM.   Monitor.  Curlene Labrum, MD [facility] [address] [place], [place] [address] [phone] (office)

## 2018-08-31 NOTE — Progress Notes (Signed)
PROGRESS NOTE   Jose Lawrence  ZDG:[id number] DOB: 1940/09/28 DOA: 08/25/2018 PCP: Glenda Chroman, MD   Brief Narrative:  78 year old male with a history of colon cancer status post resection and colostomy, with undergoing colonoscopy for a 2 to 3-week history of dark-colored stool.  He was found to have a partially obstructing lesion in his transverse colon which was a source of dark stools.  He was admitted to the hospital and seen by general surgery with plans for resection.  Cardiology is also evaluated the patient for preoperative evaluation.  He underwent stress test on 1/20 that was found to be low risk study.  Pt had operative management on 1/21.  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 Colon mass.  Patient found to have a partially obstructing mass in the transverse colon.  Seen by general surgery with plans for total colectomy and ileostomy 1/21.   Pt is POD#2 s/p completion colectomy with end ileostomy.  CT scan of the chest did not show any clear evidence of metastatic disease.   2. Anemia secondary to acute blood loss.  Hg down to 7.7.  Will defer to surgery if transfusion needed.  3. Hyperkalemia - RESOLVED.  lasix IV was given.   4. Type 2 diabetes mellitus.  Hyperglycemia. Much improved.  Q4h SSI started. Holding oral agents.  Continue on sliding scale insulin.   5. AKI - Improving.  Holding home valsartan, hydrating with IVFs.   6. Hypertension.  Chronically on valsartan.  Currently on hold in the setting of bleeding.  Blood pressures been stable.  7. Leukocytosis - WBC trending down.  8. BPH.  Continue on Flomax. 9. Hypothyroidism.  Continue on Synthroid. 10. Chronic atrial  fibrillation.  Heart rate is stable on low-dose metoprolol IV.  He was not on anticoagulation in the past due to issues with anemia.  He is chronically on aspirin which is currently on hold due to bleeding issues.  DVT prophylaxis: SCDs, hep sq Code Status: Full code Family Communication: Discussed with wife at bedside Disposition Plan: Discharge home when medically/surgically stabilized  Consultants:   General surgery  Cardiology  Gastroenterology  Procedures:  Colonoscopy:- Widely patent end colostomy with healthy appearing mucosa in the descending colon.                           - Malignant partially obstructing tumor in the mid                            transverse colon with active bleeding. Biopsied.                           - The descending colon, proximal transverse colon,                            hepatic flexure, ascending colon and cecum are normal.  Antimicrobials:      Subjective: Pt having some flatus, No BM.     Objective: Vitals:   08/31/18 0300  08/31/18 0400 08/31/18 0500 08/31/18 0600  BP: (!) 82/69 91/62 98/66  (!) 96/59  Pulse: 86 96 86 81  Resp: 16 18 16 15   Temp:  (!) 97.5 F (36.4 C)    TempSrc:      SpO2: 95% (!) 85% 96% 95%  Weight:      Height:        Intake/Output Summary (Last 24 hours) at 08/31/2018 0722 Last data filed at 08/31/2018 4818 Gross per 24 hour  Intake 2276.14 ml  Output 1780 ml  Net 496.14 ml   Filed Weights   08/25/18 1037 08/30/18 0500  Weight: 87.9 kg 87.9 kg    Examination:  General exam: Alert, awake, oriented x 3 Respiratory system: Clear to auscultation. Respiratory effort normal. Cardiovascular system: Irregular. No murmurs, rubs, gallops. Gastrointestinal system: Abdomen is mildly distended, soft.  No organomegaly or masses felt. Normal bowel sounds heard.  Ileostomy Central nervous system: Alert and oriented. No focal neurological deficits. Extremities: No C/C/E, +pedal pulses Skin: No rashes, lesions  or ulcers Psychiatry: Judgement and insight appear normal. Mood & affect appropriate.   Data Reviewed: I have personally reviewed following labs and imaging studies  CBC: Recent Labs  Lab 08/27/18 1046 08/28/18 0653 08/29/18 1553 08/29/18 1946 08/30/18 0403 08/31/18 0408  WBC 7.3 7.6  --  17.8* 22.7* 18.0*  NEUTROABS  --   --   --   --  20.5* 15.2*  HGB 9.7* 9.8* 8.8* 10.3* 9.2* 7.7*  HCT 33.5* 33.3* 26.0* 35.3* 32.2* 26.2*  MCV 88.6 88.8  --  91.2 91.5 88.5  PLT 250 273  --  362 291 563   Basic Metabolic Panel: Recent Labs  Lab 08/28/18 0653 08/29/18 1553 08/30/18 0403 08/30/18 1058 08/30/18 1945 08/31/18 0408  NA 134* 135 135 135 135 136  K 3.8 4.7 5.3* 5.5* 4.5 4.6  CL 102  --  104 102 103 104  CO2 25  --  21* 22 24 25   GLUCOSE 202* 237* 288* 284* 138* 169*  BUN 13  --  16 19 23  25*  CREATININE 0.94  --  1.75* 1.77* 1.64* 1.52*  CALCIUM 8.5*  --  7.8* 8.0* 7.9* 7.7*  MG  --   --  1.7  --   --  2.3  PHOS  --   --  4.6  --   --  3.0   GFR: Estimated Creatinine Clearance: 46.1 mL/min (A) (by C-G formula based on SCr of 1.52 mg/dL (H)). Liver Function Tests: Recent Labs  Lab 08/25/18 1553  AST 21  ALT 15  ALKPHOS 74  BILITOT 0.6  PROT 6.8  ALBUMIN 3.2*   No results for input(s): LIPASE, AMYLASE in the last 168 hours. No results for input(s): AMMONIA in the last 168 hours. Coagulation Profile: Recent Labs  Lab 08/25/18 1553  INR 1.12   Cardiac Enzymes: No results for input(s): CKTOTAL, CKMB, CKMBINDEX, TROPONINI in the last 168 hours. BNP (last 3 results) No results for input(s): PROBNP in the last 8760 hours. HbA1C: No results for input(s): HGBA1C in the last 72 hours. CBG: Recent Labs  Lab 08/30/18 1114 08/30/18 1602 08/30/18 2015 08/30/18 2350 08/31/18 0410  GLUCAP 268* 188* 136* 158* 157*   Lipid Profile: No results for input(s): CHOL, HDL, LDLCALC, TRIG, CHOLHDL, LDLDIRECT in the last 72 hours. Thyroid Function Tests: No results for  input(s): TSH, T4TOTAL, FREET4, T3FREE, THYROIDAB in the last 72 hours. Anemia Panel: No results for input(s): VITAMINB12, FOLATE, FERRITIN, TIBC, IRON,  RETICCTPCT in the last 72 hours. Sepsis Labs: No results for input(s): PROCALCITON, LATICACIDVEN in the last 168 hours.  Recent Results (from the past 240 hour(s))  Surgical pcr screen     Status: None   Collection Time: 08/28/18  4:53 PM  Result Value Ref Range Status   MRSA, PCR NEGATIVE NEGATIVE Final   Staphylococcus aureus NEGATIVE NEGATIVE Final    Comment: (NOTE) The Xpert SA Assay (FDA approved for NASAL specimens in patients 39 years of age and older), is one component of a comprehensive surveillance program. It is not intended to diagnose infection nor to guide or monitor treatment. Performed at [facility], [address] [place], [place] [address]   MRSA PCR Screening     Status: None   Collection Time: 08/29/18  8:45 PM  Result Value Ref Range Status   MRSA by PCR NEGATIVE NEGATIVE Final    Comment:        The GeneXpert MRSA Assay (FDA approved for NASAL specimens only), is one component of a comprehensive MRSA colonization surveillance program. It is not intended to diagnose MRSA infection nor to guide or monitor treatment for MRSA infections. Performed at [facility], [address], [place], [place] [address]    Radiology Studies: No results found. Scheduled Meds: . dextromethorphan  30 mg Oral BID  . heparin  5,000 Units Subcutaneous Q8H  . insulin aspart  0-20 Units Subcutaneous Q4H  . insulin glargine  10 Units Subcutaneous Daily  . ketorolac  15 mg Intravenous Q6H  . levothyroxine  50 mcg Oral QAC breakfast  . metoprolol tartrate  5 mg Intravenous Q6H  . pantoprazole (PROTONIX) IV  40 mg Intravenous QHS  . sodium chloride flush  3 mL Intravenous Q12H  . tamsulosin  0.4 mg Oral QHS   Continuous Infusions: . sodium chloride    . cefoTEtan (CEFOTAN) IV 2 g (08/31/18 0410)  . lactated ringers 100  mL/hr at 08/31/18 0639    LOS: 6 days   Time spent: 24 mins  Irwin Brakeman, MD [facility]   If 7PM-7AM, please contact night-coverage [email or web address]  08/31/2018, 7:22 AM

## 2018-09-01 LAB — CBC WITH DIFFERENTIAL/PLATELET
Abs Immature Granulocytes: 0.07 10*3/uL (ref 0.00–0.07)
Basophils Absolute: 0 10*3/uL (ref 0.0–0.1)
Basophils Relative: 0 %
Eosinophils Absolute: 0.3 10*3/uL (ref 0.0–0.5)
Eosinophils Relative: 3 %
HCT: 25.4 % — ABNORMAL LOW (ref 39.0–52.0)
HEMOGLOBIN: 7.6 g/dL — AB (ref 13.0–17.0)
Immature Granulocytes: 1 %
Lymphocytes Relative: 11 %
Lymphs Abs: 1.1 10*3/uL (ref 0.7–4.0)
MCH: 26.7 pg (ref 26.0–34.0)
MCHC: 29.9 g/dL — AB (ref 30.0–36.0)
MCV: 89.1 fL (ref 80.0–100.0)
Monocytes Absolute: 0.5 10*3/uL (ref 0.1–1.0)
Monocytes Relative: 6 %
Neutro Abs: 7.4 10*3/uL (ref 1.7–7.7)
Neutrophils Relative %: 79 %
Platelets: 189 10*3/uL (ref 150–400)
RBC: 2.85 MIL/uL — ABNORMAL LOW (ref 4.22–5.81)
RDW: 14 % (ref 11.5–15.5)
WBC: 9.4 10*3/uL (ref 4.0–10.5)
nRBC: 0.2 % (ref 0.0–0.2)

## 2018-09-01 LAB — COMPREHENSIVE METABOLIC PANEL
ALT: 11 U/L (ref 0–44)
AST: 15 U/L (ref 15–41)
Albumin: 2 g/dL — ABNORMAL LOW (ref 3.5–5.0)
Alkaline Phosphatase: 60 U/L (ref 38–126)
Anion gap: 6 (ref 5–15)
BUN: 22 mg/dL (ref 8–23)
CHLORIDE: 104 mmol/L (ref 98–111)
CO2: 26 mmol/L (ref 22–32)
Calcium: 7.8 mg/dL — ABNORMAL LOW (ref 8.9–10.3)
Creatinine, Ser: 1.15 mg/dL (ref 0.61–1.24)
GFR calc Af Amer: >60 mL/min (ref >60)
Glucose, Bld: 120 mg/dL — ABNORMAL HIGH (ref 70–99)
Potassium: 4.3 mmol/L (ref 3.5–5.1)
Sodium: 136 mmol/L (ref 135–145)
Total Bilirubin: 0.7 mg/dL (ref 0.3–1.2)
Total Protein: 5 g/dL — ABNORMAL LOW (ref 6.5–8.1)

## 2018-09-01 LAB — PHOSPHORUS: Phosphorus: 2.2 mg/dL — ABNORMAL LOW (ref 2.5–4.6)

## 2018-09-01 LAB — HEMOGLOBIN AND HEMATOCRIT, BLOOD
HCT: 24.8 % — ABNORMAL LOW (ref 39.0–52.0)
HEMOGLOBIN: 7.5 g/dL — AB (ref 13.0–17.0)

## 2018-09-01 LAB — GLUCOSE, CAPILLARY
Glucose-Capillary: 115 mg/dL — ABNORMAL HIGH (ref 70–99)
Glucose-Capillary: 146 mg/dL — ABNORMAL HIGH (ref 70–99)
Glucose-Capillary: 146 mg/dL — ABNORMAL HIGH (ref 70–99)

## 2018-09-01 LAB — PROTIME-INR
INR: 1.17
Prothrombin Time: 14.8 seconds (ref 11.4–15.2)

## 2018-09-01 LAB — MAGNESIUM: Magnesium: 2.3 mg/dL (ref 1.7–2.4)

## 2018-09-01 MED ORDER — DEXTROMETHORPHAN POLISTIREX ER 30 MG/5ML PO SUER
30.0000 mg | Freq: Two times a day (BID) | ORAL | Status: DC | PRN
  Administered 2018-09-01 – 2018-09-02 (×2): 30 mg via ORAL

## 2018-09-01 MED ORDER — SODIUM PHOSPHATES 45 MMOLE/15ML IV SOLN
10.0000 mmol | Freq: Once | INTRAVENOUS | Status: AC
  Administered 2018-09-01: 10 mmol via INTRAVENOUS
  Filled 2018-09-01: qty 3.33

## 2018-09-01 NOTE — Progress Notes (Signed)
PROGRESS NOTE   MARTYN TIMME  OYD:[id number] DOB: 1941-04-22 DOA: 08/25/2018 PCP: Glenda Chroman, MD   Brief Narrative:  78 year old male with a history of colon cancer status post resection and colostomy, with undergoing colonoscopy for a 2 to 3-week history of dark-colored stool.  He was found to have a partially obstructing lesion in his transverse colon which was a source of dark stools.  He was admitted to the hospital and seen by general surgery with plans for resection.  Cardiology is also evaluated the patient for preoperative evaluation.  He underwent stress test on 1/20 that was found to be low risk study.  Pt had operative management on 1/21.  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 Colon mass.  Surgically managed.  Patient found to have a partially obstructing mass in the transverse colon.  Seen by general surgery with plans for total colectomy and ileostomy 1/21.   Pt is POD#3 s/p completion colectomy with end ileostomy.  CT scan of the chest did not show any clear evidence of metastatic disease.   He is progressing.  Bowel sounds heard.  Some ileostomy output reported.  2. Anemia secondary to acute blood loss.  Hg down to 7.6 after 1 unit PRBC given 1/23. Likely will receive additional PRBC.  Will defer to surgery if transfusion needed.  Will continue to monitor.   3. Hyperkalemia - RESOLVED.  lasix IV was given.   4. Low Phos - IV replacement ordered.  5. Type 2 diabetes mellitus.  Hyperglycemia is much improved.  Holding home oral agents.  Continue on sliding scale insulin.   6. AKI - RESOLVED.  Creatinine 1.15.  Holding home valsartan, he was treated with IVFs.    7. Hypertension.  Chronically on valsartan.  Currently on hold in the setting of soft blood pressures.  Blood pressures been low normal not requiring antihypertensive agents.  8. Leukocytosis - RESOLVED.  WBC within normal limits.  9. BPH.  Continue on Flomax. 10. Hypothyroidism.  Continue on Synthroid. 11. Chronic atrial fibrillation.  Heart rate is stable on low-dose metoprolol IV.  Resume home oral metoprolol when he has tolerated his diet well.  He was not on anticoagulation in the past due to issues with anemia.  He is chronically on aspirin which is currently on hold due to bleeding issues.  DVT prophylaxis: SCDs, hep sq Code Status: Full code Family Communication: Discussed with wife at bedside Disposition Plan: Discharge home when medically/surgically stabilized  Consultants:   General surgery  Cardiology  Gastroenterology  Procedures:  Colonoscopy:- Widely patent end colostomy with healthy appearing mucosa in the descending colon.                           - Malignant partially obstructing tumor in the mid transverse colon with active bleeding. Biopsied.                           - The descending colon, proximal transverse colon, hepatic flexure, ascending colon and cecum are normal.  Antimicrobials:      Subjective: Pt having bowel  sounds, pain well controlled with abdominal binder on.  So far, he has been tolerating clear liquids.        Objective: Vitals:   09/01/18 0700 09/01/18 0800 09/01/18 0907 09/01/18 0908  BP: (!) 91/59 (!) 103/57 119/67 119/67  Pulse: 80 75 84 87  Resp: 16 14  18   Temp:  97.6 F (36.4 C)    TempSrc:      SpO2: 93% 95% 97% 98%  Weight:      Height:        Intake/Output Summary (Last 24 hours) at 09/01/2018 1414 Last data filed at 09/01/2018 1300 Gross per 24 hour  Intake 2293.05 ml  Output 1750 ml  Net 543.05 ml   Filed Weights   08/25/18 1037 08/30/18 0500  Weight: 87.9 kg 87.9 kg    Examination:  General exam: Alert,  awake, oriented x 3. He appears comfortable, No distress.  Respiratory system: BBS mostly clear. Respiratory effort normal. Cardiovascular system: Irregularly irregular. No murmurs, rubs, gallops. Gastrointestinal system: Abdomen is mildly distended, soft, mild generalized tenderness. Abdominal binder in place.  No organomegaly or masses felt. Normal bowel sounds heard.  Ileostomy.  Central nervous system: Alert and oriented. No focal neurological deficits. Extremities: No C/C/E, +pedal pulses Skin: No rashes, lesions or ulcers Psychiatry: Judgement and insight appear normal. Mood & affect appropriate.   Data Reviewed: I have personally reviewed following labs and imaging studies  CBC: Recent Labs  Lab 08/28/18 0653  08/29/18 1946 08/30/18 0403 08/31/18 0408 08/31/18 1623 09/01/18 0454  WBC 7.6  --  17.8* 22.7* 18.0*  --  9.4  NEUTROABS  --   --   --  20.5* 15.2*  --  7.4  HGB 9.8*   < > 10.3* 9.2* 7.7* 8.0* 7.6*  HCT 33.3*   < > 35.3* 32.2* 26.2* 27.2* 25.4*  MCV 88.8  --  91.2 91.5 88.5  --  89.1  PLT 273  --  362 291 227  --  189   < > = values in this interval not displayed.   Basic Metabolic Panel: Recent Labs  Lab 08/30/18 0403 08/30/18 1058 08/30/18 1945 08/31/18 0408 09/01/18 0454  NA 135 135 135 136 136  K 5.3* 5.5* 4.5 4.6 4.3  CL 104 102 103 104 104  CO2 21* 22 24 25 26   GLUCOSE 288* 284* 138* 169* 120*  BUN 16 19 23  25* 22  CREATININE 1.75* 1.77* 1.64* 1.52* 1.15  CALCIUM 7.8* 8.0* 7.9* 7.7* 7.8*  MG 1.7  --   --  2.3 2.3  PHOS 4.6  --   --  3.0 2.2*   GFR: Estimated Creatinine Clearance: 60.9 mL/min (by C-G formula based on SCr of 1.15 mg/dL). Liver Function Tests: Recent Labs  Lab 08/25/18 1553 08/31/18 0721 09/01/18 0454  AST 21  --  15  ALT 15  --  11  ALKPHOS 74  --  60  BILITOT 0.6  --  0.7  PROT 6.8  --  5.0*  ALBUMIN 3.2* 2.2* 2.0*   No results for input(s): LIPASE, AMYLASE in the last 168 hours. No results for input(s): AMMONIA in  the last 168 hours. Coagulation Profile: Recent Labs  Lab 08/25/18 1553  INR 1.12   Cardiac Enzymes: No results for input(s): CKTOTAL, CKMB, CKMBINDEX, TROPONINI in the last 168 hours. BNP (last 3 results) No results for input(s): PROBNP in the last 8760 hours. HbA1C: No results for input(s): HGBA1C in the last 72 hours. CBG: Recent  Labs  Lab 08/31/18 0754 08/31/18 1102 08/31/18 1603 08/31/18 2131 09/01/18 0744  GLUCAP 169* 158* 151* 96 115*   Lipid Profile: No results for input(s): CHOL, HDL, LDLCALC, TRIG, CHOLHDL, LDLDIRECT in the last 72 hours. Thyroid Function Tests: No results for input(s): TSH, T4TOTAL, FREET4, T3FREE, THYROIDAB in the last 72 hours. Anemia Panel: No results for input(s): VITAMINB12, FOLATE, FERRITIN, TIBC, IRON, RETICCTPCT in the last 72 hours. Sepsis Labs: No results for input(s): PROCALCITON, LATICACIDVEN in the last 168 hours.  Recent Results (from the past 240 hour(s))  Surgical pcr screen     Status: None   Collection Time: 08/28/18  4:53 PM  Result Value Ref Range Status   MRSA, PCR NEGATIVE NEGATIVE Final   Staphylococcus aureus NEGATIVE NEGATIVE Final    Comment: (NOTE) The Xpert SA Assay (FDA approved for NASAL specimens in patients 25 years of age and older), is one component of a comprehensive surveillance program. It is not intended to diagnose infection nor to guide or monitor treatment. Performed at [facility], [address], [place], [place] [address]   MRSA PCR Screening     Status: None   Collection Time: 08/29/18  8:45 PM  Result Value Ref Range Status   MRSA by PCR NEGATIVE NEGATIVE Final    Comment:        The GeneXpert MRSA Assay (FDA approved for NASAL specimens only), is one component of a comprehensive MRSA colonization surveillance program. It is not intended to diagnose MRSA infection nor to guide or monitor treatment for MRSA infections. Performed at [facility], [address] [place], [place]  [address]    Radiology Studies: No results found. Scheduled Meds: . dextromethorphan  30 mg Oral BID  . heparin  5,000 Units Subcutaneous Q8H  . insulin aspart  0-20 Units Subcutaneous TID WC & HS  . insulin glargine  10 Units Subcutaneous Daily  . ketorolac  15 mg Intravenous Q6H  . levothyroxine  50 mcg Oral QAC breakfast  . metoprolol tartrate  5 mg Intravenous Q6H  . pantoprazole (PROTONIX) IV  40 mg Intravenous QHS  . sodium chloride flush  3 mL Intravenous Q12H  . tamsulosin  0.4 mg Oral QHS   Continuous Infusions: . sodium chloride    . lactated ringers Stopped (09/01/18 0923)  . sodium phosphate  Dextrose 5% IVPB 42 mL/hr at 09/01/18 1140    LOS: 7 days   Time spent: 27 mins  Irwin Brakeman, MD [facility]  09/01/2018, 2:14 PM

## 2018-09-01 NOTE — Progress Notes (Signed)
[facility] Progress Note  3 Days Post-Op  Subjective: Improving overall. Having bowel function. Tolerating clears. Pain controlled. Waiting to ambulate.  H&H rechecked.   Objective: Vital signs in last 24 hours: Temp:  [97.6 F (36.4 C)-99.1 F (37.3 C)] 97.6 F (36.4 C) (01/24 0800) Pulse Rate:  [75-94] 87 (01/24 0908) Resp:  [14-20] 18 (01/24 0908) BP: (81-119)/(52-72) 119/67 (01/24 0908) SpO2:  [90 %-98 %] 98 % (01/24 0908) Last BM Date: 08/29/18  Intake/Output from previous day: 01/23 0701 - 01/24 0700 In: 2354 [P.O.:240; I.V.:1620; Blood:294; IV Piggyback:200] Out: 1400 [Urine:1200; Drains:200] Intake/Output this shift: Total I/O In: 505.1 [P.O.:240; I.V.:169.1; IV Piggyback:96.1] Out: 710 [Urine:300; Drains:190; Stool:220]  General appearance: alert, cooperative and no distress Resp: normal work of breathing GI: soft, nondistended, appropriately tender, staples c/d/i wtih no erythema or drainage, ostomy with green output  Extremities: extremities normal, atraumatic, no cyanosis or edema  Lab Results:  Recent Labs    08/31/18 0408  09/01/18 0454 09/01/18 1419  WBC 18.0*  --  9.4  --   HGB 7.7*   < > 7.6* 7.5*  HCT 26.2*   < > 25.4* 24.8*  PLT 227  --  189  --    < > = values in this interval not displayed.   BMET Recent Labs    08/31/18 0408 09/01/18 0454  NA 136 136  K 4.6 4.3  CL 104 104  CO2 25 26  GLUCOSE 169* 120*  BUN 25* 22  CREATININE 1.52* 1.15  CALCIUM 7.7* 7.8*   PT/INR Recent Labs    09/01/18 1419  LABPROT 14.8  INR 1.17     Assessment/Plan: Mr. Noreen is a 78 yo POD 3 s/p Ex lap, completion colectomy and end ileostomy for colon cancer and bladder repair. Doing well. Tolerating clears. H&H drifting still, drain clearing up and is less dark SS.  PRN for pain IS, ambulate HD ok, will monitor with ambulation to see if gets tachcy or has issues requiring more transfusion Soft diet Leukocytosis resolved post  op Foley in place, remains in place with JP due to bladder injury, urology to see 14 days out  LR stopped SCDs, heparin sq  H&H repeated and stable, monitor, may need more blood  Home in the next few days, will need to match I/Os for discharge due to ileostomy output causing dehydration    LOS: 7 days    [name] 09/01/2018

## 2018-09-01 NOTE — Care Management Important Message (Signed)
Important Message  Patient Details  Name: Jose Lawrence MRN: [id number] Date of Birth: 04-09-1941   Medicare Important Message Given:  Yes    Shelda Altes 09/01/2018, 3:18 PM

## 2018-09-02 LAB — TYPE AND SCREEN
ABO/RH(D): A POS
Antibody Screen: NEGATIVE
UNIT DIVISION: 0
Unit division: 0
Unit division: 0
Unit division: 0

## 2018-09-02 LAB — COMPREHENSIVE METABOLIC PANEL
ALBUMIN: 2 g/dL — AB (ref 3.5–5.0)
ALK PHOS: 76 U/L (ref 38–126)
ALT: 11 U/L (ref 0–44)
AST: 18 U/L (ref 15–41)
Anion gap: 5 (ref 5–15)
BUN: 17 mg/dL (ref 8–23)
CO2: 28 mmol/L (ref 22–32)
Calcium: 7.9 mg/dL — ABNORMAL LOW (ref 8.9–10.3)
Chloride: 104 mmol/L (ref 98–111)
Creatinine, Ser: 1.02 mg/dL (ref 0.61–1.24)
GFR calc Af Amer: >60 mL/min (ref >60)
GFR calc non Af Amer: >60 mL/min (ref >60)
Glucose, Bld: 116 mg/dL — ABNORMAL HIGH (ref 70–99)
Potassium: 3.8 mmol/L (ref 3.5–5.1)
Sodium: 137 mmol/L (ref 135–145)
Total Bilirubin: 0.7 mg/dL (ref 0.3–1.2)
Total Protein: 5.1 g/dL — ABNORMAL LOW (ref 6.5–8.1)

## 2018-09-02 LAB — CBC WITH DIFFERENTIAL/PLATELET
Abs Immature Granulocytes: 0.07 10*3/uL (ref 0.00–0.07)
Basophils Absolute: 0 10*3/uL (ref 0.0–0.1)
Basophils Relative: 1 %
EOS PCT: 5 %
Eosinophils Absolute: 0.3 10*3/uL (ref 0.0–0.5)
HCT: 24.8 % — ABNORMAL LOW (ref 39.0–52.0)
Hemoglobin: 7.4 g/dL — ABNORMAL LOW (ref 13.0–17.0)
Immature Granulocytes: 1 %
LYMPHS PCT: 18 %
Lymphs Abs: 1.1 10*3/uL (ref 0.7–4.0)
MCH: 26.2 pg (ref 26.0–34.0)
MCHC: 29.8 g/dL — ABNORMAL LOW (ref 30.0–36.0)
MCV: 87.9 fL (ref 80.0–100.0)
Monocytes Absolute: 0.6 10*3/uL (ref 0.1–1.0)
Monocytes Relative: 9 %
Neutro Abs: 4.1 10*3/uL (ref 1.7–7.7)
Neutrophils Relative %: 66 %
Platelets: 182 10*3/uL (ref 150–400)
RBC: 2.82 MIL/uL — ABNORMAL LOW (ref 4.22–5.81)
RDW: 14.1 % (ref 11.5–15.5)
WBC: 6.2 10*3/uL (ref 4.0–10.5)
nRBC: 0 % (ref 0.0–0.2)

## 2018-09-02 LAB — GLUCOSE, CAPILLARY
Glucose-Capillary: 123 mg/dL — ABNORMAL HIGH (ref 70–99)
Glucose-Capillary: 133 mg/dL — ABNORMAL HIGH (ref 70–99)
Glucose-Capillary: 151 mg/dL — ABNORMAL HIGH (ref 70–99)
Glucose-Capillary: 188 mg/dL — ABNORMAL HIGH (ref 70–99)

## 2018-09-02 LAB — BPAM RBC
BLOOD PRODUCT EXPIRATION DATE: 202001232359
Blood Product Expiration Date: 202001232359
Blood Product Expiration Date: 202002082359
Blood Product Expiration Date: 202002082359
ISSUE DATE / TIME: 202001211752
ISSUE DATE / TIME: 202001221838
ISSUE DATE / TIME: 202001231236
Unit Type and Rh: 6200
Unit Type and Rh: 6200
Unit Type and Rh: 6200
Unit Type and Rh: 6200

## 2018-09-02 LAB — PREPARE RBC (CROSSMATCH)

## 2018-09-02 MED ORDER — SODIUM CHLORIDE 0.9% IV SOLUTION: Freq: Once | INTRAVENOUS | Status: DC

## 2018-09-02 MED ORDER — FUROSEMIDE 10 MG/ML IJ SOLN
20.0000 mg | Freq: Once | INTRAMUSCULAR | Status: AC
  Administered 2018-09-02: 20 mg via INTRAVENOUS
  Filled 2018-09-02: qty 2

## 2018-09-02 NOTE — Progress Notes (Signed)
[facility] Progress Note  4 Days Post-Op  Subjective: Doing fair but weak with ambulation and feeling a little short of breath. Anemia post operative from acute blood loss on top of prior chronic anemia. Tolerating diet.   Objective: Vital signs in last 24 hours: Temp:  [98.5 F (36.9 C)-98.9 F (37.2 C)] 98.9 F (37.2 C) (01/25 1425) Pulse Rate:  [66-84] 66 (01/25 1425) Resp:  [16-19] 18 (01/25 1425) BP: (102-111)/(63-66) 111/66 (01/25 1425) SpO2:  [94 %-98 %] 97 % (01/25 1425) Last BM Date: 09/02/18  Intake/Output from previous day: 01/24 0701 - 01/25 0700 In: 508.1 [P.O.:240; I.V.:172.1; IV Piggyback:96.1] Out: 1660 [Urine:300; Drains:390; Stool:970] Intake/Output this shift: Total I/O In: 960 [P.O.:960] Out: 1075 [Urine:550; Drains:175; Stool:350]  General appearance: alert, cooperative and no distress Resp: normal work breathing GI: soft, nondistended, appropriately tender, staples w dressing ostomy pink with sucus  Drain with more serous output   Lab Results:  Recent Labs    09/01/18 0454 09/01/18 1419 09/02/18 0543  WBC 9.4  --  6.2  HGB 7.6* 7.5* 7.4*  HCT 25.4* 24.8* 24.8*  PLT 189  --  182   BMET Recent Labs    09/01/18 0454 09/02/18 0543  NA 136 137  K 4.3 3.8  CL 104 104  CO2 26 28  GLUCOSE 120* 116*  BUN 22 17  CREATININE 1.15 1.02  CALCIUM 7.8* 7.9*   PT/INR Recent Labs    09/01/18 1419  LABPROT 14.8  INR 1.17    Anti-infectives: Anti-infectives (From admission, onward)   Start     Dose/Rate Route Frequency Ordered Stop   08/30/18 0400  cefoTEtan (CEFOTAN) 2 g in sodium chloride 0.9 % 100 mL IVPB     2 g 200 mL/hr over 30 Minutes Intravenous Every 12 hours 08/29/18 2309 08/31/18 1718   08/29/18 0600  cefoTEtan (CEFOTAN) 2 g in sodium chloride 0.9 % 100 mL IVPB     2 g 200 mL/hr over 30 Minutes Intravenous On call to O.R. 08/28/18 1650 08/29/18 1641      Assessment/Plan: Mr. Bradt is a 78 yo POD 4 s/p Ex  lap, completion colectomy and end ileostomy for colon cancer and bladder repair. Diet advanced and tolerating. PRN for pain IS, ambulate but weak and SOB HD ok, stopped telemetry  Soft diet Foley in place, remains in place with JP due to bladder injury, urology to see outpatient  SCDs, heparin sq  H&H down from post operative anemia from acute blood loss from surgery, transfusing due to symptoms of weakness and SOB with ambulation, 2U pRBC and lasix between    LOS: 8 days    [name] 09/02/2018

## 2018-09-02 NOTE — Progress Notes (Addendum)
PROGRESS NOTE   Jose Lawrence  GNO:[id number] DOB: [birth year] DOA: 08/25/2018 PCP: Glenda Chroman, MD   Brief Narrative:  78 year old male with a history of colon cancer status post resection and colostomy, with undergoing colonoscopy for a 2 to 3-week history of dark-colored stool.  He was found to have a partially obstructing lesion in his transverse colon which was a source of dark stools.  He was admitted to the hospital and seen by general surgery with plans for resection.  Cardiology is also evaluated the patient for preoperative evaluation.  He underwent stress test on 1/20 that was found to be low risk study.  Pt had operative management on 1/21.  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 Colon mass.  Surgically managed.  Patient found to have a partially obstructing mass in the transverse colon.  Seen by general surgery with plans for total colectomy and ileostomy 1/21.   Pt is POD#4 s/p completion colectomy with end ileostomy.  CT scan of the chest did not show any clear evidence of metastatic disease.   He is progressing.  Bowel sounds heard.  Some ileostomy output reported.  Pt is ambulating the halls  2. Anemia secondary to acute blood loss.  Hg down to 7.4 after 1 unit PRBC given 1/23. He will be transfused an additional 2 unit PRBC today per Dr. Constance Haw.  Will continue to monitor.   3. Hyperkalemia - RESOLVED.    4. Low Phos - IV replacement was given.  5. Type 2 diabetes mellitus.  Hyperglycemia is much improved.  Holding home oral agents.  Continue on sliding scale insulin.   6. AKI - RESOLVED.  Creatinine 1.15.  Holding home valsartan, he was treated with IVFs.   7. Hypertension.   Chronically on valsartan.  Currently on hold in the setting of soft blood pressures.  Blood pressures been low normal not requiring antihypertensive agents.  8. Leukocytosis - RESOLVED.  WBC within normal limits.  9. BPH.  Continue on Flomax. 10. Hypothyroidism.  Continue on Synthroid. 11. Chronic atrial fibrillation.  Heart rate is stable on low-dose metoprolol IV.  Resume home oral metoprolol when he has tolerated his diet well.  He was not on anticoagulation in the past due to issues with anemia.  He is chronically on aspirin which is currently on hold due to bleeding issues.  DVT prophylaxis: SCDs, hep sq Code Status: Full code Family Communication: Discussed with wife at bedside Disposition Plan: Discharge home when medically/surgically stabilized  Consultants:   General surgery  Cardiology  Gastroenterology  Procedures:  Colonoscopy:- Widely patent end colostomy with healthy appearing mucosa in the descending colon.                           - Malignant partially obstructing tumor in the mid transverse colon with active bleeding. Biopsied.                           - The descending colon, proximal transverse colon, hepatic flexure, ascending colon and cecum are normal.  Antimicrobials:      Subjective:  Pt has started ambulating a little more in the halls.  He still has a nagging cough but he is using his incent spirometer.        Objective: Vitals:   09/01/18 0908 09/01/18 2033 09/01/18 2102 09/02/18 0454  BP: 119/67  102/65 107/64  Pulse: 87  77 84  Resp: 18  18 16   Temp:   98.5 F (36.9 C)   TempSrc:   Oral   SpO2: 98% 98% 96% 94%  Weight:      Height:        Intake/Output Summary (Last 24 hours) at 09/02/2018 1225 Last data filed at 09/02/2018 0900 Gross per 24 hour  Intake 963 ml  Output 1325 ml  Net -362 ml   Filed Weights   08/25/18 1037 08/30/18 0500  Weight: 87.9 kg 87.9 kg   Examination:  General exam: Alert, awake, oriented x 3. He appears  comfortable, No distress.  Respiratory system: BBS mostly clear. Respiratory effort normal. Cardiovascular system: Irregularly irregular. No murmurs, rubs, gallops. Gastrointestinal system: Abdomen is mildly distended, soft, mild generalized tenderness. Abdominal binder in place.  No organomegaly or masses felt. Normal bowel sounds heard.  Ileostomy.  Central nervous system: Alert and oriented. No focal neurological deficits. Extremities: No C/C/E, +pedal pulses Skin: No rashes, lesions or ulcers Psychiatry: Judgement and insight appear normal. Mood & affect appropriate.   Data Reviewed: I have personally reviewed following labs and imaging studies  CBC: Recent Labs  Lab 08/29/18 1946 08/30/18 0403 08/31/18 0408 08/31/18 1623 09/01/18 0454 09/01/18 1419 09/02/18 0543  WBC 17.8* 22.7* 18.0*  --  9.4  --  6.2  NEUTROABS  --  20.5* 15.2*  --  7.4  --  4.1  HGB 10.3* 9.2* 7.7* 8.0* 7.6* 7.5* 7.4*  HCT 35.3* 32.2* 26.2* 27.2* 25.4* 24.8* 24.8*  MCV 91.2 91.5 88.5  --  89.1  --  87.9  PLT 362 291 227  --  189  --  749   Basic Metabolic Panel: Recent Labs  Lab 08/30/18 0403 08/30/18 1058 08/30/18 1945 08/31/18 0408 09/01/18 0454 09/02/18 0543  NA 135 135 135 136 136 137  K 5.3* 5.5* 4.5 4.6 4.3 3.8  CL 104 102 103 104 104 104  CO2 21* 22 24 25 26 28   GLUCOSE 288* 284* 138* 169* 120* 116*  BUN 16 19 23  25* 22 17  CREATININE 1.75* 1.77* 1.64* 1.52* 1.15 1.02  CALCIUM 7.8* 8.0* 7.9* 7.7* 7.8* 7.9*  MG 1.7  --   --  2.3 2.3  --   PHOS 4.6  --   --  3.0 2.2*  --    GFR: Estimated Creatinine Clearance: 68.7 mL/min (by C-G formula based on SCr of 1.02 mg/dL). Liver Function Tests: Recent Labs  Lab 08/31/18 0721 09/01/18 0454 09/02/18 0543  AST  --  15 18  ALT  --  11 11  ALKPHOS  --  60 76  BILITOT  --  0.7 0.7  PROT  --  5.0* 5.1*  ALBUMIN 2.2* 2.0* 2.0*   No results for input(s): LIPASE, AMYLASE in the last 168 hours. No results for input(s): AMMONIA in the last  168 hours. Coagulation Profile: Recent Labs  Lab 09/01/18 1419  INR 1.17   Cardiac Enzymes: No results for input(s): CKTOTAL, CKMB, CKMBINDEX, TROPONINI in the last 168 hours. BNP (last 3 results) No results for input(s): PROBNP in the last 8760 hours. HbA1C: No results for input(s): HGBA1C in the last 72 hours.  CBG: Recent Labs  Lab 09/01/18 0744 09/01/18 1619 09/01/18 2103 09/02/18 0715 09/02/18 1133  GLUCAP 115* 146* 146* 123* 133*   Lipid Profile: No results for input(s): CHOL, HDL, LDLCALC, TRIG, CHOLHDL, LDLDIRECT in the last 72 hours. Thyroid Function Tests: No results for input(s): TSH, T4TOTAL, FREET4, T3FREE, THYROIDAB in the last 72 hours. Anemia Panel: No results for input(s): VITAMINB12, FOLATE, FERRITIN, TIBC, IRON, RETICCTPCT in the last 72 hours. Sepsis Labs: No results for input(s): PROCALCITON, LATICACIDVEN in the last 168 hours.  Recent Results (from the past 240 hour(s))  Surgical pcr screen     Status: None   Collection Time: 08/28/18  4:53 PM  Result Value Ref Range Status   MRSA, PCR NEGATIVE NEGATIVE Final   Staphylococcus aureus NEGATIVE NEGATIVE Final    Comment: (NOTE) The Xpert SA Assay (FDA approved for NASAL specimens in patients 23 years of age and older), is one component of a comprehensive surveillance program. It is not intended to diagnose infection nor to guide or monitor treatment. Performed at [facility], [address] [place], [place] [address]   MRSA PCR Screening     Status: None   Collection Time: 08/29/18  8:45 PM  Result Value Ref Range Status   MRSA by PCR NEGATIVE NEGATIVE Final    Comment:        The GeneXpert MRSA Assay (FDA approved for NASAL specimens only), is one component of a comprehensive MRSA colonization surveillance program. It is not intended to diagnose MRSA infection nor to guide or monitor treatment for MRSA infections. Performed at [facility], [address] [place], [place] [address]     Radiology Studies: No results found. Scheduled Meds: . sodium chloride   Intravenous Once  . furosemide  20 mg Intravenous Once  . heparin  5,000 Units Subcutaneous Q8H  . insulin aspart  0-20 Units Subcutaneous TID WC & HS  . insulin glargine  10 Units Subcutaneous Daily  . ketorolac  15 mg Intravenous Q6H  . levothyroxine  50 mcg Oral QAC breakfast  . metoprolol tartrate  5 mg Intravenous Q6H  . pantoprazole (PROTONIX) IV  40 mg Intravenous QHS  . sodium chloride flush  3 mL Intravenous Q12H  . tamsulosin  0.4 mg Oral QHS   Continuous Infusions: . sodium chloride      LOS: 8 days   Time spent: 25 mins  Irwin Brakeman, MD [facility]  09/02/2018, 12:25 PM

## 2018-09-03 LAB — COMPREHENSIVE METABOLIC PANEL
ALT: 13 U/L (ref 0–44)
AST: 22 U/L (ref 15–41)
Albumin: 2 g/dL — ABNORMAL LOW (ref 3.5–5.0)
Alkaline Phosphatase: 87 U/L (ref 38–126)
Anion gap: 8 (ref 5–15)
BUN: 10 mg/dL (ref 8–23)
CO2: 25 mmol/L (ref 22–32)
Calcium: 7.9 mg/dL — ABNORMAL LOW (ref 8.9–10.3)
Chloride: 102 mmol/L (ref 98–111)
Creatinine, Ser: 0.83 mg/dL (ref 0.61–1.24)
Glucose, Bld: 136 mg/dL — ABNORMAL HIGH (ref 70–99)
Potassium: 3.4 mmol/L — ABNORMAL LOW (ref 3.5–5.1)
Sodium: 135 mmol/L (ref 135–145)
Total Bilirubin: 0.9 mg/dL (ref 0.3–1.2)
Total Protein: 5.3 g/dL — ABNORMAL LOW (ref 6.5–8.1)

## 2018-09-03 LAB — CBC WITH DIFFERENTIAL/PLATELET
Abs Immature Granulocytes: 0.08 10*3/uL — ABNORMAL HIGH (ref 0.00–0.07)
Basophils Absolute: 0 10*3/uL (ref 0.0–0.1)
Basophils Relative: 1 %
Eosinophils Absolute: 0.3 10*3/uL (ref 0.0–0.5)
Eosinophils Relative: 5 %
HCT: 30.3 % — ABNORMAL LOW (ref 39.0–52.0)
Hemoglobin: 9.7 g/dL — ABNORMAL LOW (ref 13.0–17.0)
IMMATURE GRANULOCYTES: 1 %
Lymphocytes Relative: 14 %
Lymphs Abs: 0.9 10*3/uL (ref 0.7–4.0)
MCH: 27.2 pg (ref 26.0–34.0)
MCHC: 32 g/dL (ref 30.0–36.0)
MCV: 85.1 fL (ref 80.0–100.0)
Monocytes Absolute: 0.8 10*3/uL (ref 0.1–1.0)
Monocytes Relative: 12 %
Neutro Abs: 4.5 10*3/uL (ref 1.7–7.7)
Neutrophils Relative %: 67 %
Platelets: 178 10*3/uL (ref 150–400)
RBC: 3.56 MIL/uL — ABNORMAL LOW (ref 4.22–5.81)
RDW: 14.3 % (ref 11.5–15.5)
WBC: 6.6 10*3/uL (ref 4.0–10.5)
nRBC: 0 % (ref 0.0–0.2)

## 2018-09-03 LAB — GLUCOSE, CAPILLARY
Glucose-Capillary: 133 mg/dL — ABNORMAL HIGH (ref 70–99)
Glucose-Capillary: 249 mg/dL — ABNORMAL HIGH (ref 70–99)
Glucose-Capillary: 166 mg/dL — ABNORMAL HIGH (ref 70–99)
Glucose-Capillary: 146 mg/dL — ABNORMAL HIGH (ref 70–99)

## 2018-09-03 LAB — TYPE AND SCREEN
ABO/RH(D): A POS
Antibody Screen: NEGATIVE
UNIT DIVISION: 0
Unit division: 0

## 2018-09-03 LAB — BPAM RBC
Blood Product Expiration Date: 202002082359
Blood Product Expiration Date: 202002222359
ISSUE DATE / TIME: 202001251404
ISSUE DATE / TIME: 202001251847
Unit Type and Rh: 5100
Unit Type and Rh: 6200

## 2018-09-03 MED ORDER — PANTOPRAZOLE SODIUM 40 MG IV SOLR
40.0000 mg | Freq: Every day | INTRAVENOUS | Status: DC
  Administered 2018-09-03: 40 mg via INTRAVENOUS
  Filled 2018-09-03: qty 40

## 2018-09-03 MED ORDER — PANTOPRAZOLE SODIUM 40 MG IV SOLR: 40.0000 mg | Freq: Every day | INTRAVENOUS | Status: DC

## 2018-09-03 MED ORDER — PANTOPRAZOLE SODIUM 40 MG PO TBEC: 40.0000 mg | DELAYED_RELEASE_TABLET | Freq: Every day | ORAL | Status: DC

## 2018-09-03 MED ORDER — PSYLLIUM 95 % PO PACK
1.0000 | PACK | Freq: Every day | ORAL | Status: DC
  Administered 2018-09-03 – 2018-09-04 (×2): 1 via ORAL
  Filled 2018-09-03 (×2): qty 1

## 2018-09-03 MED ORDER — BENZONATATE 100 MG PO CAPS
200.0000 mg | ORAL_CAPSULE | Freq: Three times a day (TID) | ORAL | Status: DC
  Administered 2018-09-03 – 2018-09-04 (×3): 200 mg via ORAL
  Filled 2018-09-03 (×3): qty 2

## 2018-09-03 MED ORDER — ACETAMINOPHEN-CODEINE #3 300-30 MG PO TABS
1.0000 | ORAL_TABLET | ORAL | Status: DC | PRN
  Administered 2018-09-03: 1 via ORAL
  Filled 2018-09-03: qty 1

## 2018-09-03 MED ORDER — METOPROLOL TARTRATE 25 MG PO TABS
25.0000 mg | ORAL_TABLET | Freq: Two times a day (BID) | ORAL | Status: DC
  Administered 2018-09-03 – 2018-09-04 (×2): 25 mg via ORAL
  Filled 2018-09-03 (×2): qty 1

## 2018-09-03 MED ORDER — POTASSIUM CHLORIDE CRYS ER 20 MEQ PO TBCR
40.0000 meq | EXTENDED_RELEASE_TABLET | Freq: Two times a day (BID) | ORAL | Status: AC
  Administered 2018-09-03 (×2): 40 meq via ORAL
  Filled 2018-09-03 (×2): qty 2

## 2018-09-03 NOTE — Progress Notes (Signed)
[facility] Progress Note  5 Days Post-Op  Subjective: Feeling better after blood. Less weak. Taking in good amount. Ileostomy with about 1L out.   Objective: Vital signs in last 24 hours: Temp:  [98.2 F (36.8 C)-99.1 F (37.3 C)] 98.2 F (36.8 C) (01/26 0526) Pulse Rate:  [65-80] 73 (01/26 0526) Resp:  [17-19] 17 (01/26 0526) BP: (110-119)/(63-70) 119/66 (01/26 0526) SpO2:  [94 %-98 %] 94 % (01/26 0526) Last BM Date: 09/03/18  Intake/Output from previous day: 01/25 0701 - 01/26 0700 In: 1743 [P.O.:1200; I.V.:53; Blood:490] Out: 2675 [YBFXO:3291; Drains:350; BTYOM:6004] Intake/Output this shift: Total I/O In: 240 [P.O.:240] Out: -   General appearance: alert, cooperative and no distress Resp: normal work of breathing GI: soft, nondistended, ostomy making sucus, staples c/d/ i without erythema or drainage  Lab Results:  Recent Labs    09/02/18 0543 09/03/18 0601  WBC 6.2 6.6  HGB 7.4* 9.7*  HCT 24.8* 30.3*  PLT 182 178   BMET Recent Labs    09/02/18 0543 09/03/18 0601  NA 137 135  K 3.8 3.4*  CL 104 102  CO2 28 25  GLUCOSE 116* 136*  BUN 17 10  CREATININE 1.02 0.83  CALCIUM 7.9* 7.9*   PT/INR Recent Labs    09/01/18 1419  LABPROT 14.8  INR 1.17     Anti-infectives: Anti-infectives (From admission, onward)   Start     Dose/Rate Route Frequency Ordered Stop   08/30/18 0400  cefoTEtan (CEFOTAN) 2 g in sodium chloride 0.9 % 100 mL IVPB     2 g 200 mL/hr over 30 Minutes Intravenous Every 12 hours 08/29/18 2309 08/31/18 1718   08/29/18 0600  cefoTEtan (CEFOTAN) 2 g in sodium chloride 0.9 % 100 mL IVPB     2 g 200 mL/hr over 30 Minutes Intravenous On call to O.R. 08/28/18 1650 08/29/18 1641      Assessment/Plan: Jose Lawrence is a 78 yo POD 5 s/p Ex lap, completion colectomy and end ileostomy for colon cancer and bladder repair. Intake good and output 1L, needs to take in about 2L to account for all losses.  PRN for pain IS,  ambulate HD ok Soft diet; patient to record ins and outs today  Foley in place, remains in place with JP due to bladder injury, urology to see outpatient 09/12/2018 SCDs, heparin sq  Likely home tomorrow    LOS: 9 days    [name] 09/03/2018

## 2018-09-03 NOTE — Progress Notes (Addendum)
PROGRESS NOTE   Jose Lawrence  WUX:[id number] DOB: 24-Feb-1941 DOA: 08/25/2018 PCP: Glenda Chroman, MD   Brief Narrative:  78 year old male with a history of colon cancer status post resection and colostomy, with undergoing colonoscopy for a 2 to 3-week history of dark-colored stool.  He was found to have a partially obstructing lesion in his transverse colon which was a source of dark stools.  He was admitted to the hospital and seen by general surgery with plans for resection.  Cardiology is also evaluated the patient for preoperative evaluation.  He underwent stress test on 1/20 that was found to be low risk study.  Pt had operative management on 1/21.  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 Colon mass.  Surgically managed.  Patient found to have a partially obstructing mass in the transverse colon.  Seen by general surgery with plans for total colectomy and ileostomy 1/21.   Pt is POD#4 s/p completion colectomy with end ileostomy.  CT scan of the chest did not show any clear evidence of metastatic disease.   He is progressing.  Bowel sounds heard.  Some ileostomy output reported.  Pt is ambulating the halls  2. Anemia secondary to acute blood loss.  Hg down to 7.4 after 1 unit PRBC given 1/23.  He was transfused an additional 2 unit PRBC on 1/25 per Dr. Constance Haw.  Follow-up hemoglobin has improved.  Patient is feeling better. 3. Type 2 diabetes mellitus.  Hyperglycemia is much improved.  Holding home oral agents.  Continue on sliding scale insulin.  Will resume home agents on discharge. 4. AKI - RESOLVED.  Creatinine 1.15.  Holding home valsartan, he was treated with IVFs.   5. Hypertension.   Blood pressures currently stable on metoprolol.  Can consider restarting valsartan as an outpatient as blood pressure tolerates..  6. BPH.  Continue on Flomax. 7. Hypothyroidism.  Continue on Synthroid. 8. Chronic atrial fibrillation.  Heart rate is stable on low-dose metoprolol IV.  Will transition to oral metoprolol.  He was not on anticoagulation in the past due to issues with anemia.  He is chronically on aspirin which is currently on hold due to bleeding issues.  Restart aspirin once okay with surgery. 9. Depression.  Patient/family are concerned about depression substance.  I suspect these are situational related to him being in the hospital as well as related to his pain medications.  I would be reluctant to start him on any antidepressants right now.  Recommend close follow-up with his primary care physician/psychiatrist to discuss this further.  If symptoms are persisting, can consider starting antidepressant as an outpatient.  DVT prophylaxis: SCDs, hep sq Code Status: Full code Family Communication: Discussed with wife at bedside Disposition Plan: Discharge home when medically/surgically stabilized  Consultants:   General surgery  Cardiology  Gastroenterology  Procedures:  Colonoscopy:- Widely patent end colostomy with healthy appearing mucosa in the descending colon.                           - Malignant partially obstructing tumor in the mid transverse colon with active bleeding. Biopsied.                           -  The descending colon, proximal transverse colon, hepatic flexure, ascending colon and cecum are normal.  Antimicrobials:      Subjective: Denies any significant abdominal pain outside of coughing.  Still has significant cough.  No dizziness or lightheadedness.  Feels better after receiving blood.  Patient and family do note that he feels significantly depressed.  He reports that this has also occurred in the past when he has taken any opiates.         Objective: Vitals:   09/02/18 2055 09/02/18 2124 09/03/18 0526 09/03/18 1403  BP:  117/63 119/66 116/61  Pulse:  65 73 62  Resp:  18 17 18   Temp:  98.4 F (36.9 C) 98.2 F (36.8 C) 98.6 F (37 C)  TempSrc:  Oral Oral Oral  SpO2: 97% 98% 94% 97%  Weight:      Height:        Intake/Output Summary (Last 24 hours) at 09/03/2018 1840 Last data filed at 09/03/2018 1300 Gross per 24 hour  Intake 533 ml  Output 2775 ml  Net -2242 ml   Filed Weights   08/25/18 1037 08/30/18 0500  Weight: 87.9 kg 87.9 kg   Examination:  General exam: Alert, awake, oriented x 3 Respiratory system: Clear to auscultation. Respiratory effort normal. Cardiovascular system: Irregular. No murmurs, rubs, gallops. Gastrointestinal system: Abdomen is nondistended, soft and nontender. No organomegaly or masses felt. Normal bowel sounds heard.  Ostomy with brown-colored stool Central nervous system: Alert and oriented. No focal neurological deficits. Extremities: No C/C/E, +pedal pulses Skin: No rashes, lesions or ulcers Psychiatry: Appears to be tearful, does not keep eye contact  Data Reviewed: I have personally reviewed following labs and imaging studies  CBC: Recent Labs  Lab 08/30/18 0403 08/31/18 0408 08/31/18 1623 09/01/18 0454 09/01/18 1419 09/02/18 0543 09/03/18 0601  WBC 22.7* 18.0*  --  9.4  --  6.2 6.6  NEUTROABS 20.5* 15.2*  --  7.4  --  4.1 4.5  HGB 9.2* 7.7* 8.0* 7.6* 7.5* 7.4* 9.7*  HCT 32.2* 26.2* 27.2* 25.4* 24.8* 24.8* 30.3*  MCV 91.5 88.5  --  89.1  --  87.9 85.1  PLT 291 227  --  189  --  182 448   Basic Metabolic Panel: Recent Labs  Lab 08/30/18 0403  08/30/18 1945 08/31/18 0408 09/01/18 0454 09/02/18 0543 09/03/18 0601  NA 135   < > 135 136 136 137 135  K 5.3*   < > 4.5 4.6 4.3 3.8 3.4*  CL 104   < > 103 104 104 104 102  CO2 21*   < > 24 25 26 28 25   GLUCOSE 288*   < > 138* 169* 120* 116* 136*  BUN 16   < > 23 25* 22 17 10   CREATININE 1.75*   < > 1.64* 1.52*  1.15 1.02 0.83  CALCIUM 7.8*   < > 7.9* 7.7* 7.8* 7.9* 7.9*  MG 1.7  --   --  2.3 2.3  --   --   PHOS 4.6  --   --  3.0 2.2*  --   --    < > = values in this interval not displayed.   GFR: Estimated Creatinine Clearance: 84.4 mL/min (by C-G formula based on SCr of 0.83 mg/dL). Liver Function Tests: Recent Labs  Lab 08/31/18 0721 09/01/18 0454 09/02/18 0543 09/03/18 0601  AST  --  15 18 22   ALT  --  11 11 13   ALKPHOS  --  60 76  87  BILITOT  --  0.7 0.7 0.9  PROT  --  5.0* 5.1* 5.3*  ALBUMIN 2.2* 2.0* 2.0* 2.0*   No results for input(s): LIPASE, AMYLASE in the last 168 hours. No results for input(s): AMMONIA in the last 168 hours. Coagulation Profile: Recent Labs  Lab 09/01/18 1419  INR 1.17   Cardiac Enzymes: No results for input(s): CKTOTAL, CKMB, CKMBINDEX, TROPONINI in the last 168 hours. BNP (last 3 results) No results for input(s): PROBNP in the last 8760 hours. HbA1C: No results for input(s): HGBA1C in the last 72 hours. CBG: Recent Labs  Lab 09/02/18 1602 09/02/18 2050 09/03/18 0724 09/03/18 1126 09/03/18 1625  GLUCAP 151* 188* 133* 249* 166*   Lipid Profile: No results for input(s): CHOL, HDL, LDLCALC, TRIG, CHOLHDL, LDLDIRECT in the last 72 hours. Thyroid Function Tests: No results for input(s): TSH, T4TOTAL, FREET4, T3FREE, THYROIDAB in the last 72 hours. Anemia Panel: No results for input(s): VITAMINB12, FOLATE, FERRITIN, TIBC, IRON, RETICCTPCT in the last 72 hours. Sepsis Labs: No results for input(s): PROCALCITON, LATICACIDVEN in the last 168 hours.  Recent Results (from the past 240 hour(s))  Surgical pcr screen     Status: None   Collection Time: 08/28/18  4:53 PM  Result Value Ref Range Status   MRSA, PCR NEGATIVE NEGATIVE Final   Staphylococcus aureus NEGATIVE NEGATIVE Final    Comment: (NOTE) The Xpert SA Assay (FDA approved for NASAL specimens in patients 66 years of age and older), is one component of a comprehensive surveillance  program. It is not intended to diagnose infection nor to guide or monitor treatment. Performed at [facility], [address] [place], [place] [address]   MRSA PCR Screening     Status: None   Collection Time: 08/29/18  8:45 PM  Result Value Ref Range Status   MRSA by PCR NEGATIVE NEGATIVE Final    Comment:        The GeneXpert MRSA Assay (FDA approved for NASAL specimens only), is one component of a comprehensive MRSA colonization surveillance program. It is not intended to diagnose MRSA infection nor to guide or monitor treatment for MRSA infections. Performed at [facility], [address], [place], [place] [address]    Radiology Studies: No results found. Scheduled Meds: . sodium chloride   Intravenous Once  . benzonatate  200 mg Oral TID  . heparin  5,000 Units Subcutaneous Q8H  . insulin aspart  0-20 Units Subcutaneous TID WC & HS  . insulin glargine  10 Units Subcutaneous Daily  . levothyroxine  50 mcg Oral QAC breakfast  . metoprolol tartrate  25 mg Oral BID  . pantoprazole (PROTONIX) IV  40 mg Intravenous QHS  . potassium chloride  40 mEq Oral BID  . psyllium  1 packet Oral Daily  . sodium chloride flush  3 mL Intravenous Q12H  . tamsulosin  0.4 mg Oral QHS   Continuous Infusions: . sodium chloride      LOS: 9 days   Time spent: 25 mins  Kathie Dike, MD [facility]  09/03/2018, 6:40 PM

## 2018-09-04 DIAGNOSIS — D62 Acute posthemorrhagic anemia: Secondary | ICD-10-CM

## 2018-09-04 DIAGNOSIS — N9981 Other intraoperative complications of genitourinary system: Secondary | ICD-10-CM

## 2018-09-04 LAB — CBC WITH DIFFERENTIAL/PLATELET
Abs Immature Granulocytes: 0.14 10*3/uL — ABNORMAL HIGH (ref 0.00–0.07)
Basophils Absolute: 0.1 10*3/uL (ref 0.0–0.1)
Basophils Relative: 1 %
Eosinophils Absolute: 0.5 10*3/uL (ref 0.0–0.5)
Eosinophils Relative: 7 %
HCT: 33 % — ABNORMAL LOW (ref 39.0–52.0)
Hemoglobin: 10.2 g/dL — ABNORMAL LOW (ref 13.0–17.0)
IMMATURE GRANULOCYTES: 2 %
Lymphocytes Relative: 14 %
Lymphs Abs: 1.1 10*3/uL (ref 0.7–4.0)
MCH: 27.4 pg (ref 26.0–34.0)
MCHC: 30.9 g/dL (ref 30.0–36.0)
MCV: 88.7 fL (ref 80.0–100.0)
Monocytes Absolute: 0.9 10*3/uL (ref 0.1–1.0)
Monocytes Relative: 13 %
NEUTROS PCT: 63 %
NRBC: 0 % (ref 0.0–0.2)
Neutro Abs: 4.7 10*3/uL (ref 1.7–7.7)
Platelets: 205 10*3/uL (ref 150–400)
RBC: 3.72 MIL/uL — ABNORMAL LOW (ref 4.22–5.81)
RDW: 14.6 % (ref 11.5–15.5)
WBC: 7.4 10*3/uL (ref 4.0–10.5)

## 2018-09-04 LAB — COMPREHENSIVE METABOLIC PANEL
ALT: 12 U/L (ref 0–44)
AST: 19 U/L (ref 15–41)
Albumin: 2.1 g/dL — ABNORMAL LOW (ref 3.5–5.0)
Alkaline Phosphatase: 87 U/L (ref 38–126)
Anion gap: 6 (ref 5–15)
BILIRUBIN TOTAL: 0.8 mg/dL (ref 0.3–1.2)
BUN: 9 mg/dL (ref 8–23)
CHLORIDE: 104 mmol/L (ref 98–111)
CO2: 27 mmol/L (ref 22–32)
Calcium: 8.4 mg/dL — ABNORMAL LOW (ref 8.9–10.3)
Creatinine, Ser: 0.99 mg/dL (ref 0.61–1.24)
GFR calc Af Amer: >60 mL/min (ref >60)
GFR calc non Af Amer: >60 mL/min (ref >60)
Glucose, Bld: 153 mg/dL — ABNORMAL HIGH (ref 70–99)
POTASSIUM: 5 mmol/L (ref 3.5–5.1)
Sodium: 137 mmol/L (ref 135–145)
TOTAL PROTEIN: 5.5 g/dL — AB (ref 6.5–8.1)

## 2018-09-04 LAB — GLUCOSE, CAPILLARY
GLUCOSE-CAPILLARY: 164 mg/dL — AB (ref 70–99)
Glucose-Capillary: 237 mg/dL — ABNORMAL HIGH (ref 70–99)

## 2018-09-04 MED ORDER — METOPROLOL TARTRATE 25 MG PO TABS: 25.0000 mg | ORAL_TABLET | Freq: Two times a day (BID) | ORAL | 0 refills | Status: DC

## 2018-09-04 MED ORDER — ONDANSETRON HCL 4 MG PO TABS: 4.0000 mg | ORAL_TABLET | Freq: Four times a day (QID) | ORAL | 0 refills | Status: DC | PRN

## 2018-09-04 MED ORDER — LOPERAMIDE HCL 2 MG PO CAPS: 2.0000 mg | ORAL_CAPSULE | Freq: Three times a day (TID) | ORAL | 3 refills | Status: DC

## 2018-09-04 MED ORDER — OXYCODONE HCL 5 MG PO TABS: 5.0000 mg | ORAL_TABLET | ORAL | 0 refills | Status: DC | PRN

## 2018-09-04 MED ORDER — PSYLLIUM 95 % PO PACK: 1.0000 | PACK | Freq: Every day | ORAL | 3 refills | Status: DC

## 2018-09-04 MED ORDER — LOPERAMIDE HCL 2 MG PO CAPS
2.0000 mg | ORAL_CAPSULE | Freq: Three times a day (TID) | ORAL | Status: DC
  Administered 2018-09-04: 2 mg via ORAL
  Filled 2018-09-04: qty 1

## 2018-09-04 NOTE — Care Management Important Message (Signed)
Important Message  Patient Details  Name: MAZI BRAILSFORD MRN: [id number] Date of Birth: 04-11-1941   Medicare Important Message Given:  Yes    Shelda Altes 09/04/2018, 1:29 PM

## 2018-09-04 NOTE — Discharge Instructions (Signed)
Ileostomy Output:  I called [facility] and they will send you samples of their equivalent to the new bag.   Monitor how much output you have from your ileostomy a day and drink at least twice that amount in fluids as you will also have losses from sweat and urine.  Example: 1 L output you need 2 L of intake   If you are having more than 1 liter of output a day, please start these medications in a stepwise fashion. Metamucil 34 g packet in 30 mL of water daily.  Imodium 2 - 4 mg four times a day; 30 minutes before meals and before bed.  Start with 2 mg and increase to 4 mg if needed.  Lomotil 2.5 mg four times a day; 30 minutes before meals and before bed.   If you are still having trouble with output, please let us know in your office.   You may need to adjust these medications as you have less output.   Discharge Open Abdominal Surgery Instructions:  Common Complaints: Pain at the incision site is common. This will improve with time. Take your pain medications as described below. Some nausea is common and poor appetite. The main goal is to stay hydrated the first few days after surgery.   Diet/ Activity: Diet as tolerated. You have started and tolerated a diet in the hospital, and should continue to increase what you are able to eat.   You may not have a large appetite, but it is important to stay hydrated. Drink 64 ounces of water a day. Your appetite will return with time.  Keep a dry dressing in place over your staples daily or as needed. Some minor pink/ blood tinged drainage is expected.  This will stop in a few days after surgery.  Shower per your regular routine daily.  Do not take hot showers. Take warm showers that are less than 10 minutes. Path the incision dry. Wear an abdominal binder daily with activity. You do not have to wear this while sleeping or sitting.  Rest and listen to your body, but do not remain in bed all day.  Walk everyday for at least 15-20 minutes. Deep cough  and move around every 1-2 hours in the first few days after surgery.  Do not lift > 10 lbs, perform excessive bending, pushing, pulling, squatting for 6-8 weeks after surgery.  The activity restrictions and the abdominal binder are to prevent hernia formation at your incision while you are healing.  Do not place lotions or balms on your incision unless instructed to specifically by Dr. Constance Haw.   Medication: Take tylenol and ibuprofen as needed for pain control, alternating every 4-6 hours.  Example:  Tylenol 1000mg  @ 6am, 12noon, 6pm, 41midnight (Do not exceed 4000mg  of tylenol a day). Ibuprofen 800mg  @ 9am, 3pm, 9pm, 3am (Do not exceed 3600mg  of ibuprofen a day).  Take Roxicodone for breakthrough pain every 4 hours.  Take Colace for constipation related to narcotic pain medication. If you do not have a bowel movement in 2 days, take Miralax over the counter.  Drink plenty of water to also prevent constipation.   Contact Information: If you have questions or concerns, please call our office, [phone], Monday- Thursday 8AM-5PM and Friday 8AM-12Noon.  If it is after hours or on the weekend, please call [facility], [phone], and ask to speak to the surgeon on call for Dr. Constance Haw at [facility].    Indwelling Urinary Catheter Care, Adult An indwelling  urinary catheter is a thin, flexible, germ-free (sterile) tube that is placed into the bladder to help drain urine out of the body. The catheter is inserted into the part of the body that drains urine from the bladder (urethra). Urine drains from the catheter into a drainage bag outside of the body. Taking good care of your catheter will keep it working properly and help to prevent problems from developing. What are the risks?  Bacteria may get into your bladder and cause a urinary tract infection.  Urine flow can become blocked. This can happen if the catheter is not working correctly, or if you have sediment or a blood clot in  your bladder or the catheter.  Tissue near the catheter may become irritated and bleed. How to wear your catheter and your drainage bag Supplies needed  Adhesive tape or a leg strap.  Alcohol wipe or soap and water (if you use tape).  A clean towel (if you use tape).  Overnight drainage bag.  Smaller drainage bag (leg bag). Wearing your catheter and bag Use adhesive tape or a leg strap to attach your catheter to your leg.  Make sure the catheter is not pulled tight.  If a leg strap gets wet, replace it with a dry one.  If you use adhesive tape: 1. Use an alcohol wipe or soap and water to wash off any stickiness on your skin where you had tape before. 2. Use a clean towel to pat-dry the area. 3. Apply the new tape. You should have received a large overnight drainage bag and a smaller leg bag that fits underneath clothing.  You may wear the overnight bag at any time, but you should not wear the leg bag at night.  Always wear the leg bag below your knee.  Make sure the overnight drainage bag is always lower than the level of your bladder, but do not let it touch the floor. Before you go to sleep, hang the bag inside a wastebasket that is covered by a clean plastic bag. How to care for your skin around the catheter     Supplies needed  A clean washcloth.  Water and mild soap.  A clean towel. Caring for your skin and catheter  Every day, use a clean washcloth and soapy water to clean the skin around your catheter. ? Wash your hands with soap and water. ? Wet a washcloth in warm water and mild soap. ? Clean the skin around your urethra. ? If you are male: ? Use one hand to gently spread the folds of skin around your vagina (labia). ? With the washcloth in your other hand, wipe the inner side of your labia on each side. Do this in a front-to-back direction. ? If you are male: ? Use one hand to pull back any skin that covers the end of your penis (foreskin). ? With  the washcloth in your other hand, wipe your penis in small circles. Start wiping at the tip of your penis, then move outward from the catheter. ? With your free hand, hold the catheter close to where it enters your body. Keep holding the catheter during cleaning so it does not get pulled out. ? Use your other hand to clean the catheter with the washcloth. ? Only wipe downward on the catheter. ? Do not wipe upward toward your body, because that may push bacteria into your urethra and cause infection. ? Use a clean towel to pat-dry the catheter and the skin around  it. Make sure to wipe off all soap. ? Wash your hands with soap and water.  Shower every day. Do not take baths.  Do not use cream, ointment, or lotion on the area where the catheter enters your body, unless your health care provider tells you to do that.  Do not use powders, sprays, or lotions on your genital area.  Check your skin around the catheter every day for signs of infection. Check for: ? Redness, swelling, or pain. ? Fluid or blood. ? Warmth. ? Pus or a bad smell. How to empty the drainage bag Supplies needed  Rubbing alcohol.  Gauze pad or cotton ball.  Adhesive tape or a leg strap. Emptying the bag Empty your drainage bag (your overnight drainage bag or your leg bag) when it is ?- full, or at least 2-3 times a day. Clean the drainage bag according to the manufacturer's instructions or as told by your health care provider. 1. Wash your hands with soap and water. 2. Detach the drainage bag from your leg. 3. Hold the drainage bag over the toilet or a clean container. Make sure the drainage bag is lower than your hips and bladder. This stops urine from going back into the tubing and into your bladder. 4. Open the pour spout at the bottom of the bag. 5. Empty the urine into the toilet or container. Do not let the pour spout touch any surface. This precaution is important to prevent bacteria from getting in the bag  and causing infection. 6. Apply rubbing alcohol to a gauze pad or cotton ball. 7. Use the gauze pad or cotton ball to clean the pour spout. 8. Close the pour spout. 9. Attach the bag to your leg with adhesive tape or a leg strap. 10. Wash your hands with soap and water. How to change the drainage bag Supplies needed:  Alcohol wipes.  A clean drainage bag.  Adhesive tape or a leg strap. Changing the bag Replace your drainage bag with a clean bag once a month. Replace the bag sooner if it leaks, starts to smell bad, or looks dirty. 1. Wash your hands with soap and water. 2. Detach the dirty drainage bag from your leg. 3. Pinch the catheter with your fingers so that urine does not spill out. 4. Disconnect the catheter tube from the drainage tube at the connection valve. Do not let the tubes touch any surface. 5. Clean the end of the catheter tube with an alcohol wipe. Use a different alcohol wipe to clean the end of the drainage tube. 6. Connect the catheter tube to the drainage tube of the clean bag. 7. Attach the clean bag to your leg with adhesive tape or a leg strap. Avoid attaching the new bag too tightly. 8. Wash your hands with soap and water. General instructions   Never pull on your catheter or try to remove it. Pulling can damage your internal tissues.  Always wash your hands before and after you handle your catheter or drainage bag. Use a mild, fragrance-free soap. If soap and water are not available, use hand sanitizer.  Always make sure there are no twists or bends (kinks) in the catheter tube.  Always make sure there are no leaks in the catheter or drainage bag.  Drink enough fluid to keep your urine pale yellow.  Do not take baths, swim, or use a hot tub.  If you are male, wipe from front to back after having a bowel movement. Contact a  health care provider if:  Your urine is cloudy.  Your urine smells unusually bad.  Your catheter gets clogged.  Your  catheter starts to leak.  Your bladder feels full. Get help right away if:  You have redness, swelling, or pain where the catheter enters your body.  You have fluid, blood, pus, or a bad smell coming from the area where the catheter enters your body.  The area where the catheter enters your body feels warm to the touch.  You have a fever.  You have pain in your abdomen, legs, lower back, or bladder.  You see blood in the catheter.  Your urine is pink or red.  You have nausea, vomiting, or chills.  Your urine is not draining into the bag.  Your catheter gets pulled out. Summary  An indwelling urinary catheter is a thin, flexible, germ-free (sterile) tube that is placed into the bladder to help drain urine out of the body.  The catheter is inserted into the part of the body that drains urine from the bladder (urethra).  Take good care of your catheter to keep it working properly and help prevent problems from developing.  Always wash your hands before and after you handle your catheter or drainage bag.  Never pull on your catheter or try to remove it. This information is not intended to replace advice given to you by your health care provider. Make sure you discuss any questions you have with your health care provider. Document Released: 07/26/2005 Document Revised: 01/16/2018 Document Reviewed: 03/11/2017 Elsevier Interactive Patient Education  2019 [place].   Open Colectomy, Care After This sheet gives you information about how to care for yourself after your procedure. Your health care provider may also give you more specific instructions. If you have problems or questions, contact your health care provider. What can I expect after the procedure? After the procedure, it is common to have:  Pain in your abdomen, especially along your incision.  Tiredness. Your energy level will return to normal over the next several weeks.  Constipation.  Nausea.  Difficulty  urinating. Follow these instructions at home: Activity  You may be able to return to most of your normal activities within 1-2 weeks, such as working, walking up stairs, and sexual activity.  Avoid activities that require a lot of energy for 4-6 weeks after surgery, such as running, climbing, and lifting heavy objects. Ask your health care provider what activities are safe for you.  Take rest breaks during the day as needed.  Do not drive for 1-2 weeks or until your health care provider says that it is safe.  Do not drive or use heavy machinery while taking prescription pain medicines.  Do not lift anything that is heavier than 10 lb (4.3 kg) until your health care provider says that it is safe. Incision care   Follow instructions from your health care provider about how to take care of your incision. Make sure you: ? Wash your hands with soap and water before you change your bandage (dressing). If soap and water are not available, use hand sanitizer. ? Change your dressing as told by your health care provider. ? Leave stitches (sutures) or staples in place. These skin closures may need to stay in place for 2 weeks or longer.  Avoid wearing tight clothing around your incision.  Protect your incision area from the sun.  Check your incision area every day for signs of infection. Check for: ? More redness, swelling, or  pain. ? More fluid or blood. ? Warmth. ? Pus or a bad smell. General instructions  Do not take baths, swim, or use a hot tub until your health care provider approves.  You may shower.   Take over-the-counter and prescription medicines, including stool softeners, only as told by your health care provider.  Eat a low-fat and low-fiber diet for the first 4 weeks after surgery.  Keep all follow-up visits as told by your health care provider. This is important. Contact a health care provider if:  You have more redness, swelling, or pain around your incision.  You  have more fluid or blood coming from your incision.  Your incision feels warm to the touch.  You have pus or a bad smell coming from your incision.  You have a fever or chills.  You do not have a bowel movement 2-3 days after surgery.  You cannot eat or drink for 24 hours or more.  You have persistent nausea and vomiting.  You have abdominal pain that gets worse and does not get better with medicine. Get help right away if:  You have chest pain.  You have shortness of breath.  You have pain or swelling in your legs.  Your incision breaks open after your sutures or staples have been removed.  You have bleeding from the rectum. This information is not intended to replace advice given to you by your health care provider. Make sure you discuss any questions you have with your health care provider. Document Released: 02/16/2011 Document Revised: 04/26/2016 Document Reviewed: 04/26/2016 Elsevier Interactive Patient Education  2019 [place]  An ileostomy is a surgical procedure to make an opening (stoma) for stool to leave your body. The surgery is done when a medical condition prevents stool from passing through the intestines and leaving your body through the rectum. During the surgery, a part of the small intestine (ileum) is attached to the stoma made in the abdominal wall. A bag or pouch is fitted over the stoma. Stool and gas will collect in the pouch. After having this surgery, you will need to empty and change your ileostomy pouch as needed. You will also need to take steps to care for the stoma. How do I care for my stoma? Your stoma should look pink and moist. At first, the stoma may be swollen, but this swelling will go away within 6 weeks. To care for the stoma:  Keep the skin around the stoma clean and dry.  Use a clean, soft washcloth to gently wash the stoma and the skin around it.  Use stoma powder or ointment on your skin only as told  by your health care provider.  If your skin becomes irritated, change your ileostomy pouch. The irritation may indicate that the pouch is leaking.  Measure the stoma opening regularly and record the size. Watch for changes. Share the information with your health care provider.  Check your stoma area every day for signs of infection. Check for: ? More redness, swelling, or pain. ? More fluid or blood. ? Pus or warmth. How do I care for my ileostomy pouch? The pouch that fits over the stoma can have either one or two pieces.  One-piece pouch: The skin barrier and the pouch are combined in one unit.  Two-piece pouch: The skin barrier and the pouch are separate pieces that attach to each other. Empty your pouch when it is one-third to one-half full. Do not let more  stool or gas build up. This could cause the pouch to leak. Some pouches have a built-in gas release valve. Change your pouch every 3-4 days for the first 6 weeks, and then every 5-7 days. You should also change the pouch right away if your skin near the stoma looks irritated. How do I empty my ileostomy pouch? You will be taught how to empty your pouch before you leave the hospital. Basic steps include: 1. Wash your hands with soap and water. 2. Sit far back on the toilet. 3. Put pieces of toilet paper into the toilet water. This will prevent splashing as you empty the stool into the toilet bowl. 4. Unclip the tail end of the pouch or separate the hook-and-loop fastener at the end of the pouch. 5. Unroll the tail and empty stool into the toilet. 6. Clean the tail with toilet paper. 7. Reroll the tail, and clip it closed or press the hook-and-loop fastener pieces together. 8. Wash your hands again. How do I change my ileostomy pouch? You will be taught how to change the pouch before you leave the hospital. Basic steps include: 1. Lay out your supplies. 2. Wash your hands with soap and water. 3. Carefully remove the old  pouch. 4. Wash the stoma and the skin around the stoma. Allow the area to dry. Men may be told to carefully shave any hair around the stoma. 5. Use the stoma measuring guide that comes with your pouch set to decide what size hole you will need to cut in the skin barrier piece. Choose the smallest possible size that will hold the stoma but will not touch it. 6. Use the guide to trace the circle on the back of the skin barrier piece. 7. Cut out the hole. 8. Hold the skin barrier piece over the stoma to make sure the hole is the correct size. 9. Remove the adhesive paper backing from the skin barrier piece. 10. Squeeze stoma paste around the opening of the skin barrier piece. 11. Clean and dry the skin around the stoma again. 12. Carefully fit the skin barrier piece over your stoma. 13. If you are using a two-piece pouch, snap the pouch onto the skin barrier piece. 14. Close the tail of the pouch. 15. Put your hand over the top of the skin barrier piece to help warm it for about 5 minutes. This helps it conform to your body better. 63. Wash your hands again. What are some general tips?  Avoid wearing clothes that are tight directly over your stoma.  You can shower with or without the pouch in place. Do not use harsh or oily soaps or lotions. Always keep the pouch on if you are taking a bath or swimming.  If your pouch gets wet, you can dry it with a hair dryer on the cool setting.  Whenever you leave home, take an extra skin barrier and pouch with you.  Store all supplies in a cool, dry place. Do not leave supplies in extreme heat because parts can melt.  To prevent odor, you can put drops of ostomy deodorizer in the pouch. Your health care provider may also recommend putting ostomy lubricant inside the pouch. This helps the stool to slide out of the pouch more easily and completely.  Return to your normal activities as told by your health care provider. Ask your health care provider what  activities are safe for you. Contact a health care provider if:  You have more redness, swelling,  or pain around your stoma.  You have more fluid or blood coming from your stoma.  Your stoma feels warm to the touch.  You have pus coming from your stoma.  Your stoma extends in or out farther than normal.  Your stoma becomes purple, black, or pale white.  You need to change the pouch every day.  You have a fever.  You have abdominal pain, nausea, or bloating.  No stool is passing from the stoma.  You have diarrhea, requiring you to empty the pouch more frequently than normal. Get help right away if:  Your stool is bloody.  You vomit.  You have trouble breathing.  You feel dizzy or light-headed. This information is not intended to replace advice given to you by your health care provider. Make sure you discuss any questions you have with your health care provider. Document Released: 07/29/2003 Document Revised: 09/24/2016 Document Reviewed: 04/08/2015 Elsevier Interactive Patient Education  2019 [place].  Bladder Injury Repair, Care After This sheet gives you information about how to care for yourself after your procedure. Your health care provider may also give you more specific instructions. If you have problems or questions, contact your health care provider. What can I expect after the procedure? After the procedure, it is common to have:  An overactive bladder. This means you may urinate more than normal, have strong urges to urinate, and have urine leaks. These problems may last for a few weeks up to a few months.  Some incision pain.  An ache in your abdomen. You may also have:  A urinary catheter in place. This is a small, thin tube placed in the part of your body that drains urine from your bladder (urethra). The catheter will be removed after your health care provider determines that your bladder has healed. The healing may take 10 or more days, and you  will stay in the hospital during that time.  A pelvic drain in place for several days. This is a tube placed near your incision to drain excess fluid from the surgical area.  Tests, such as a CT cystogram before the urinary catheter is removed. This may be done to make sure your bladder has healed. This involves injecting dye into the bladder and then doing a CT scan of the bladder. Follow these instructions at home: Medicines  Take over-the-counter and prescription medicines only as told by your health care provider.  If you were prescribed an antibiotic medicine, take it as told by your health care provider. Do not stop taking the antibiotic even if you start to feel better. Activity  Do not drive or use heavy machinery while taking prescription pain medicine.  Limit your physical activity as told by your health care provider. Ask what activities are safe for you. Your health care provider may recommend that you: ? Do not lift anything that is heavier than 25 lb (11 kg) for up to 3 months. ? Do not do any exercise besides walking for 3 months. Incision care  Follow instructions from your health care provider about how to take care of your incision. Make sure you: ? Wash your hands with soap and water before you change your bandage (dressing). If soap and water are not available, use hand sanitizer. ? Change your dressing as told by your health care provider. ? Leave stitches (sutures), skin glue, or adhesive strips in place. These skin closures may need to stay in place for 2 weeks or longer. If adhesive strip  edges start to loosen and curl up, you may trim the loose edges. Do not remove adhesive strips completely unless your health care provider tells you to do that.  Check your incision area every day for signs of infection. Check for: ? Redness, swelling, or more pain. ? Fluid or blood. ? Warmth. ? Pus or a bad smell.  General instructions  To prevent or treat constipation while  you are taking prescription pain medicine, your health care provider may recommend that you: ? Drink enough fluid to keep your urine pale yellow. ? Take over-the-counter or prescription medicines. ? Eat foods that are high in fiber, such as fresh fruits and vegetables, whole grains, and beans. ? Limit foods that are high in fat and processed sugars, such as fried and sweet foods.   Do not take baths, swim, or use a hot tub until your health care provider approves. Ask your health care provider if you may take showers. You may only be allowed to take sponge baths.  Keep all follow-up visits as told by your health care provider. This is important. Contact a health care provider if:  You do not produce as much urine as you normally do.  You have a fever or chills.  You have severe pain: ? In your abdomen. ? In your back.  You have trouble emptying your bladder completely.  Your urine is cloudy or bloody.  Your urine has an unusual smell.  Your sutures fall out too soon.  You have redness, swelling, or more pain around your incision.  You have fluid or blood coming from your incision.  Your incision feels warm to the touch.  You have pus or a bad smell coming from your incision. Get help right away if:  You are less alert and more drowsy than usual.  You have a fast heart rate.  Your skin gets pale or gray, and you are sweating.  You have swelling or pain in your legs.  You have shortness of breath.  You have chest pain. Summary  Keep all follow-up visits as told by your health care provider. This is important.  Take over-the-counter and prescription medicines only as told by your health care provider.  Contact your healthcare provider if you have severe pain in your abdomen or your back. This information is not intended to replace advice given to you by your health care provider. Make sure you discuss any questions you have with your health care provider. Document  Released: 11/17/2016 Document Revised: 11/17/2016 Document Reviewed: 11/17/2016 Elsevier Interactive Patient Education  [employer].

## 2018-09-04 NOTE — Discharge Summary (Addendum)
Physician Discharge Summary  Patient ID: Jose Lawrence MRN: [id number] DOB/AGE: 76/12/1940 76 y.o.  Admit date: 08/25/2018 Discharge date: 09/04/2018  Admission Diagnoses: Colon Cancer   Discharge Diagnoses:  Principal Problem:   Mass of colon Active Problems:   Iron deficiency anemia due to chronic blood loss   Hypothyroidism   HTN (hypertension)   DM (diabetes mellitus), type 2 (HCC)   Colostomy in place [facility])   BPH (benign prostatic hyperplasia)   Atrial fibrillation, chronic   Cancer of transverse colon (HCC)   Intraoperative bladder injury   Postoperative anemia due to acute blood loss   Discharged Condition: good  Hospital Course: 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 He was found to have a new transverse colon cancer, and we discussed the risk and benefits and opted to remove the remaining colon due to the second primary and the location of the cancer.  We had cardiology see him prior to surgery to risk stratify him and he underwent a stress test that was low risk.  He was cleared for surgery at [facility].   The patient had a very difficult surgery with significant adhesions, and had a bladder injury secondary to his prior radiation and scarring.  Urology came in and repaired the bladder.  The patient did fair post operatively. He did have a small ileus, but this resolved.  He was slowly started on a diet and had good pain control.  He was ambulating. He was emptying his bag, and did not want the ostomy RN to help with the ostomy care in the hospital as they are educated on this due to this prior ostomy.  He had some issues with post operative anemia secondary to losses in the OR and was transfused blood due to symptoms of dizziness and weakness.    The patient also had his foley and a drain in his pelvis due to the bladder injury, and was making good urine output  prior to his discharge. His drain did become dislodged and this was removed after I spoke with Dr. Alyson Ingles.   Prior to his discharge, he was ambulating and was able to care for his ileostomy and his foley bag. The drain was removed after the dislodgement.  He was recommended for outpatient PT but did not want to pursue this option.  He was tolerating a good diet, and he understand his ileostomy care and need for keeping his output less than 1000-1241mL. He understood that he needs to drink twice as much as the output from the ileostomy.  He understood to take metamucil and imodium for keeping his ostomy output down to prevent dehydration.   Consults: urology and physical therapy; hospitalist; cardiology  Significant Diagnostic Studies: nuclear medicine: stress test 08/28/2018 - low risk without significant ischemia  Treatments: Open completion colectomy with end ileostomy, bladder repair 08/29/2018   Discharge Exam: Blood pressure 127/73, pulse 75, temperature 98.7 F (37.1 C), temperature source Oral, resp. rate 18, height 6' (1.829 m), weight 87.9 kg, SpO2 95 %. General appearance: alert, cooperative and no distress Resp: normal work breathing GI: soft, nondistended, staples c/d/i without drainage or erythema, ileostomy pink and sucus in bag, drain site covered with gauze Extremities: extremities normal, atraumatic, no cyanosis or edema  Disposition: Discharge disposition: 01-Home or Self Care       Discharge Instructions    Call MD  for:  difficulty breathing, headache or visual disturbances   Complete by:  As directed    Call MD for:  extreme fatigue   Complete by:  As directed    Call MD for:  persistant dizziness or light-headedness   Complete by:  As directed    Call MD for:  persistant nausea and vomiting   Complete by:  As directed    Call MD for:  redness, tenderness, or signs of infection (pain, swelling, redness, odor or green/yellow discharge around incision site)    Complete by:  As directed    Call MD for:  severe uncontrolled pain   Complete by:  As directed    Call MD for:  temperature >100.4   Complete by:  As directed    Diet - low sodium heart healthy   Complete by:  As directed    Increase activity slowly   Complete by:  As directed      Allergies as of 09/04/2018   No Known Allergies     Medication List    STOP taking these medications   valsartan 80 MG tablet Commonly known as:  DIOVAN     TAKE these medications   aspirin 81 MG tablet Take 81 mg by mouth daily.   cetirizine 10 MG tablet Commonly known as:  ZYRTEC Take 10 mg by mouth daily.   glipiZIDE 5 MG tablet Commonly known as:  GLUCOTROL Take 10 mg by mouth 2 (two) times daily before a meal.   levothyroxine 50 MCG tablet Commonly known as:  SYNTHROID, LEVOTHROID Take 50 mcg by mouth daily before breakfast.   loperamide 2 MG capsule Commonly known as:  IMODIUM Take 1 capsule (2 mg total) by mouth 4 (four) times daily -  before meals and at bedtime.   metoprolol tartrate 25 MG tablet Commonly known as:  LOPRESSOR Take 1 tablet (25 mg total) by mouth 2 (two) times daily.   ondansetron 4 MG tablet Commonly known as:  ZOFRAN Take 1 tablet (4 mg total) by mouth every 6 (six) hours as needed for nausea.   oxyCODONE 5 MG immediate release tablet Commonly known as:  ROXICODONE Take 1 tablet (5 mg total) by mouth every 4 (four) hours as needed for severe pain or breakthrough pain.   psyllium 95 % Pack Commonly known as:  HYDROCIL/METAMUCIL Take 1 packet by mouth daily. Start taking on:  September 05, 2018   tamsulosin 0.4 MG Caps capsule Commonly known as:  FLOMAX Take 0.4 mg by mouth at bedtime.      Follow-up Information    [name], MD On 09/07/2018.   Specialty:  General Surgery Contact information: [address] [place] [address] [phone]        Alyson Ingles Candee Furbish, MD.   Specialty:  Urology Why:  someone from his office  should call you with follow up this week for a cystogram and an appointment  Contact information: [address] [place] [address] [phone]        Glenda Chroman, MD On 09/12/2018.   Specialty:  Internal Medicine Why:  1:30 Contact information: [place] [address] [phone]          Ileostomy Output:  Monitor how much output you have from your ileostomy a day and drink at least twice that amount in fluids as you will also have losses from sweat and urine.  Example: 1 L output you need 2 L of intake  If you are having more than 1 liter of output a day, please start these medications in a stepwise fashion. Metamucil 34 g packet in 30 mL of water daily.  Imodium 2 - 4 mg four times a day; 30 minutes before meals and before bed.  Start with 2 mg and increase to 4 mg if needed.  Lomotil 2.5 mg four times a day; 30 minutes before meals and before bed.   If you are still having trouble with output, please let us know in your office.   You may need to adjust these medications as you have less output.   Discharge Open Abdominal Surgery Instructions:  Common Complaints: Pain at the incision site is common. This will improve with time. Take your pain medications as described below. Some nausea is common and poor appetite. The main goal is to stay hydrated the first few days after surgery.   Diet/ Activity: Diet as tolerated. You have started and tolerated a diet in the hospital, and should continue to increase what you are able to eat.   You may not have a large appetite, but it is important to stay hydrated. Drink 64 ounces of water a day. Your appetite will return with time.  Keep a dry dressing in place over your staples daily or as needed. Some minor pink/ blood tinged drainage is expected.  This will stop in a few days after surgery.  Shower per your regular routine daily.  Do not take hot showers. Take warm showers that are less than 10 minutes. Path  the incision dry. Wear an abdominal binder daily with activity. You do not have to wear this while sleeping or sitting.  Rest and listen to your body, but do not remain in bed all day.  Walk everyday for at least 15-20 minutes. Deep cough and move around every 1-2 hours in the first few days after surgery.  Do not lift > 10 lbs, perform excessive bending, pushing, pulling, squatting for 6-8 weeks after surgery.  The activity restrictions and the abdominal binder are to prevent hernia formation at your incision while you are healing.  Do not place lotions or balms on your incision unless instructed to specifically by Dr. Constance Haw.   Medication: Take tylenol and ibuprofen as needed for pain control, alternating every 4-6 hours.  Example:  Tylenol 1000mg  @ 6am, 12noon, 6pm, 38midnight (Do not exceed 4000mg  of tylenol a day). Ibuprofen 800mg  @ 9am, 3pm, 9pm, 3am (Do not exceed 3600mg  of ibuprofen a day).  Take Roxicodone for breakthrough pain every 4 hours.  Take Colace for constipation related to narcotic pain medication. If you do not have a bowel movement in 2 days, take Miralax over the counter.  Drink plenty of water to also prevent constipation.   Contact Information: If you have questions or concerns, please call our office, [phone], Monday- Thursday 8AM-5PM and Friday 8AM-12Noon.  If it is after hours or on the weekend, please call [facility] Main Number, [phone], and ask to speak to the surgeon on call for Dr. Constance Haw at [facility].   Signed: [name] 09/04/2018, 1:16 PM

## 2018-09-04 NOTE — Progress Notes (Signed)
Patient discharged home with instructions given on medications and follow up visits,patient  verbalized understanding. Prescriptions sent to Pharmacy of choice documented on AVS. Accompanied by staff to an awaiting vehicle. 

## 2018-09-04 NOTE — Care Management (Signed)
Patient discharging home. Met with wife and patient again in regards to home health.  Dr. Constance Haw came in, we all discussed home health. Patient is not new to having an ileostomy and does not feel that home health is needed.

## 2018-09-04 NOTE — Progress Notes (Signed)
[facility]  On seeing patient today the JP drain in the pelvis is dislodged. Notified Dr. Alyson Ingles. This was removed. He did not want a drain creatinine.  Curlene Labrum, MD [facility] [address] [place], [place] [address] [phone] (office)

## 2018-09-04 NOTE — Progress Notes (Signed)
Physical Therapy Treatment Patient Details Name: LEELYN JASINSKI MRN: [id number] DOB: 29-Aug-1940 Today's Date: 09/04/2018    History of Present Illness Patient is a 78 year old male admitted 08/25/2018 with diagnosis of colon mass. s/p resection and colostomy 08/29/2018. PMH: HTN, hypothyroidism, T2DM, BPH, a fib, cancer of transverse colon.     PT Comments    Patient presents up in chair with spouse at bedside and agreeable to therapy. Patient with improved endurance and reduced pain today. Patient performs sit to/from stand transfers using RW with min guard assistance requiring increased time and dependent on bil lower extremities to assist in rising from seated surfaces. Patient ambulates 200 ft with RW demonstrating decreased cadence, equal bil step length, mildly labored, increased steps and time with turning, no loss of balance, and increased bil upper extremity weight bearing on RW for balance and support. Patient performs seated bil lower extremity strengthening exercises with demonstration and verbal cues from therapist without increase in pain and good motor control. Patient will benefit from continued physical therapy in hospital and recommended venue below to increase strength, balance, endurance for safe ADLs and gait.    Follow Up Recommendations  Outpatient PT     Equipment Recommendations  None recommended by PT    Recommendations for Other Services       Precautions / Restrictions Precautions Precautions: Fall Restrictions Weight Bearing Restrictions: No    Mobility  Bed Mobility               General bed mobility comments: not performed secondary to patient up in chair  Transfers Overall transfer level: Needs assistance Equipment used: Rolling walker (2 wheeled) Transfers: Sit to/from Stand Sit to Stand: Min guard         General transfer comment: increased time, use of bil upper extremities on arm rests and RW  Ambulation/Gait Ambulation/Gait  assistance: Min guard Gait Distance (Feet): 200 Feet Assistive device: Rolling walker (2 wheeled) Gait Pattern/deviations: Step-through pattern;Decreased step length - right;Decreased step length - left;Decreased stride length Gait velocity: decreased   General Gait Details: mildly labored cadence with equal bil step length, no loss of balance, increased bil upper extremity dependence on RW, increased time with short steps when turning   Stairs             Wheelchair Mobility    Modified Rankin (Stroke Patients Only)       Balance Overall balance assessment: Needs assistance Sitting-balance support: Feet supported;No upper extremity supported Sitting balance-Leahy Scale: Fair Sitting balance - Comments: seated in chair without upper extremity support     Standing balance-Leahy Scale: Fair Standing balance comment: with RW                            Cognition Arousal/Alertness: Awake/alert Behavior During Therapy: WFL for tasks assessed/performed Overall Cognitive Status: Within Functional Limits for tasks assessed                                        Exercises General Exercises - Lower Extremity Long Arc Quad: Seated;AROM;Strengthening;Both;5 reps Hip Flexion/Marching: Seated;AROM;Strengthening;Both;5 reps Toe Raises: Seated;AROM;Strengthening;Both;5 reps Heel Raises: Seated;AROM;Strengthening;Both;5 reps    General Comments        Pertinent Vitals/Pain Pain Score: 2  Pain Location: incision Pain Descriptors / Indicators: Aching;Sore Pain Intervention(s): Limited activity within patient's tolerance;Monitored during session  Home Living                      Prior Function            PT Goals (current goals can now be found in the care plan section) Acute Rehab PT Goals Patient Stated Goal: go home PT Goal Formulation: With patient/family Time For Goal Achievement: 09/07/18 Potential to Achieve Goals:  Good Progress towards PT goals: Progressing toward goals    Frequency    Min 3X/week      PT Plan Current plan remains appropriate    Co-evaluation              AM-PAC PT "6 Clicks" Mobility   Outcome Measure  Help needed turning from your back to your side while in a flat bed without using bedrails?: A Little Help needed moving from lying on your back to sitting on the side of a flat bed without using bedrails?: A Little Help needed moving to and from a bed to a chair (including a wheelchair)?: A Little Help needed standing up from a chair using your arms (e.g., wheelchair or bedside chair)?: A Little Help needed to walk in hospital room?: A Little Help needed climbing 3-5 steps with a railing? : A Little 6 Click Score: 18    End of Session   Activity Tolerance: Patient tolerated treatment well;No increased pain Patient left: in chair;with call bell/phone within reach;with family/visitor present(spouse) Nurse Communication: Mobility status PT Visit Diagnosis: Unsteadiness on feet (R26.81);Other abnormalities of gait and mobility (R26.89)     Time: 8182-9937 PT Time Calculation (min) (ACUTE ONLY): 12 min  Charges:  $Gait Training: 8-22 mins                     11:03 AM, 09/04/18 Talbot Grumbling, DPT Physical Therapist with [facility] [phone] office

## 2018-09-07 ENCOUNTER — Ambulatory Visit (INDEPENDENT_AMBULATORY_CARE_PROVIDER_SITE_OTHER): Payer: Self-pay | Admitting: General Surgery

## 2018-09-07 ENCOUNTER — Encounter: Payer: Self-pay | Admitting: General Surgery

## 2018-09-07 VITALS — BP 143/84 | HR 108 | Temp 98.9°F | Resp 18 | Wt 185.8 lb

## 2018-09-07 DIAGNOSIS — C184 Malignant neoplasm of transverse colon: Secondary | ICD-10-CM

## 2018-09-07 NOTE — Patient Instructions (Addendum)
Continue diet as tolerated. Activity as tolerated. Do not lift > 10 lbs, perform excessive bending, pushing, pulling, squatting for 6-8 weeks after surgery.  The activity restrictions and the abdominal binder are to prevent hernia formation at your incision while you are healing.  Do not place lotions or balms on your incision unless instructed to specifically by Dr. Constance Haw.  Continue ileostomy care.  Referring you to Oncology and Urology.  Pack your wound twice daily with saline dampened gauze and cover with dry gauze and tape.   Continue your foley care as your have been doing.  Get some Metamucil or Benefiber and take it daily.  Ileostomy Output: Monitor how much output you have from your ileostomy a day and drink at least twice that amount in fluids as you will also have losses from sweat and urine.  Example: 1 L output you need 2 L of intake   If you are having more than 1 liter of output a day, please start these medications in a stepwise fashion. Metamucil 34 g packet in 30 mL of water daily.  Imodium 2-4 mg four times a day; 30 minutes before meals and before bed.   If you are still having trouble with output, please let us know in your office.   You may need to adjust these medications as you have less output.

## 2018-09-07 NOTE — Progress Notes (Signed)
[facility] Note   HPI:  78 y.o. Male presents to clinic for post-op follow-up evaluation after completion colectomy, end ileostomy and bladder repair on 08/29/2018. He is doing fair. He has been recording his output form his foley and his ileostomy. His UOP has ranged from 700-1039mL a day and his ileostomy output has ranged from 1000-2000. He has been taking the imodium 2mg  QID but has not started metamucil and has not titrated his imodium . Patient reports some drainage from the inferior portion of his wound.   Review of Systems:  NO fever or chills Increasing appetite Good UOP Ileostomy output at 2L only on 1 day Good hydration  All other review of systems: otherwise negative   Vital Signs:  BP (!) 143/84 (BP Location: Left Arm, Patient Position: Sitting, Cuff Size: Normal)   Pulse (!) 108   Temp 98.9 F (37.2 C) (Temporal)   Resp 18   Wt 185 lb 12.8 oz (84.3 kg)   BMI 25.20 kg/m    Physical Exam:  Physical Exam Vitals signs reviewed.  Constitutional:      Appearance: Normal appearance.  Neck:     Musculoskeletal: Normal range of motion.  Cardiovascular:     Rate and Rhythm: Regular rhythm. Tachycardia present.  Pulmonary:     Effort: Pulmonary effort is normal.  Abdominal:     General: There is no distension.     Palpations: Abdomen is soft.     Tenderness: There is abdominal tenderness.     Comments: Colostomy site closed, no drainage, no erythema, midline with erythema inferiorly, staples removed, probed and pocket opened with some drainage, ileostomy pink with sucus in bag  Skin:    General: Skin is warm and dry.  Neurological:     General: No focal deficit present.     Mental Status: He is alert and oriented to person, place, and time.  Psychiatric:        Mood and Affect: Mood normal.        Behavior: Behavior normal.        Thought Content: Thought content normal.        Judgment: Judgment normal.     Assessment:  78 y.o. yo Male s/p  completion colectomy, end ileostomy and bladder repair for colon cancer.   Plan:  Continue diet as tolerated. Activity as tolerated. Do not lift > 10 lbs, perform excessive bending, pushing, pulling, squatting for 6-8 weeks after surgery.  The activity restrictions and the abdominal binder are to prevent hernia formation at your incision while you are healing.  Do not place lotions or balms on your incision unless instructed to specifically by Dr. Constance Haw.  Continue ileostomy care.  Referring you to Oncology and Urology. Will be getting in touch with you about the day for the cystogram and Urology Appointment.   Pack your wound twice daily with saline dampened gauze and cover with dry gauze and tape.   Warned him to call with any increasing drainage from the midline wound as it could be urine if there is large drainage.  Continue your foley care as your have been doing.  Get some Metamucil or Benefiber and take it daily.  Ileostomy Output: Monitor how much output you have from your ileostomy a day and drink at least twice that amount in fluids as you will also have losses from sweat and urine.  Example: 1 L output you need 2 L of intake   If you are having more than 1  liter of output a day, please start these medications in a stepwise fashion. Metamucil 34 g packet in 30 mL of water daily.  Imodium 2-4 mg four times a day; 30 minutes before meals and before bed.   If you are still having trouble with output, please let us know in your office.   You may need to adjust these medications as you have less output.   Future Appointments  Date Time Provider [place]  09/14/2018  2:30 PM [name], MD [facility] None  09/20/2018  1:00 PM Derek Jack, MD [facility] None    All of the above recommendations were discussed with the patient and patient's family, and all of patient's and family's questions were answered to their expressed satisfaction.  Curlene Labrum,  MD [facility] [address] [place], [place] [address] [phone] (office)

## 2018-09-08 ENCOUNTER — Encounter (HOSPITAL_COMMUNITY): Payer: Self-pay | Admitting: *Deleted

## 2018-09-08 NOTE — Progress Notes (Signed)
Oncology Navigator Note:  Patient was referred to our office from Dr. Blake Divine.  I called patient today to introduce myself and provide information in how I will be involved with their care.  I provided information on their first visit and what to expect.  I made sure patient was aware of appointment time and directions to the cancer center.  My phone number was given so that he can call me with any questions or concerns.  He voices appreciation and understanding.

## 2018-09-12 ENCOUNTER — Other Ambulatory Visit: Payer: Self-pay | Admitting: Urology

## 2018-09-12 ENCOUNTER — Other Ambulatory Visit (HOSPITAL_COMMUNITY): Payer: Self-pay | Admitting: Urology

## 2018-09-12 DIAGNOSIS — S3720XA Unspecified injury of bladder, initial encounter: Secondary | ICD-10-CM

## 2018-09-12 DIAGNOSIS — Z6827 Body mass index (BMI) 27.0-27.9, adult: Secondary | ICD-10-CM | POA: Diagnosis not present

## 2018-09-12 DIAGNOSIS — Z299 Encounter for prophylactic measures, unspecified: Secondary | ICD-10-CM | POA: Diagnosis not present

## 2018-09-12 DIAGNOSIS — I4891 Unspecified atrial fibrillation: Secondary | ICD-10-CM | POA: Diagnosis not present

## 2018-09-12 DIAGNOSIS — I10 Essential (primary) hypertension: Secondary | ICD-10-CM | POA: Diagnosis not present

## 2018-09-12 DIAGNOSIS — C189 Malignant neoplasm of colon, unspecified: Secondary | ICD-10-CM | POA: Diagnosis not present

## 2018-09-12 DIAGNOSIS — I2699 Other pulmonary embolism without acute cor pulmonale: Secondary | ICD-10-CM | POA: Diagnosis not present

## 2018-09-12 LAB — GLUCOSE, CAPILLARY
Glucose-Capillary: 211 mg/dL — ABNORMAL HIGH (ref 70–99)
Glucose-Capillary: 164 mg/dL — ABNORMAL HIGH (ref 70–99)

## 2018-09-13 ENCOUNTER — Other Ambulatory Visit (HOSPITAL_COMMUNITY): Payer: Self-pay | Admitting: Urology

## 2018-09-13 ENCOUNTER — Ambulatory Visit (HOSPITAL_COMMUNITY)
Admission: RE | Admit: 2018-09-13 | Discharge: 2018-09-13 | Disposition: A | Discharge status: Home / Self Care | Payer: Medicare Other | Source: Ambulatory Visit | Attending: Urology | Admitting: Urology

## 2018-09-13 ENCOUNTER — Other Ambulatory Visit: Payer: Self-pay | Admitting: Urology

## 2018-09-13 ENCOUNTER — Other Ambulatory Visit: Payer: Self-pay | Admitting: Student

## 2018-09-13 DIAGNOSIS — S3720XA Unspecified injury of bladder, initial encounter: Secondary | ICD-10-CM

## 2018-09-13 MED ORDER — IOTHALAMATE MEGLUMINE 17.2 % UR SOLN: 125.0000 mL | Freq: Once | URETHRAL | Status: DC | PRN

## 2018-09-14 ENCOUNTER — Other Ambulatory Visit: Payer: Self-pay | Admitting: General Surgery

## 2018-09-14 ENCOUNTER — Encounter: Payer: Self-pay | Admitting: General Surgery

## 2018-09-14 ENCOUNTER — Ambulatory Visit (INDEPENDENT_AMBULATORY_CARE_PROVIDER_SITE_OTHER): Payer: Self-pay | Admitting: General Surgery

## 2018-09-14 VITALS — BP 129/71 | HR 91 | Temp 97.3°F | Resp 18 | Wt 181.2 lb

## 2018-09-14 DIAGNOSIS — C184 Malignant neoplasm of transverse colon: Secondary | ICD-10-CM

## 2018-09-14 DIAGNOSIS — Z978 Presence of other specified devices: Secondary | ICD-10-CM

## 2018-09-14 DIAGNOSIS — Z960 Presence of urogenital implants: Secondary | ICD-10-CM

## 2018-09-14 DIAGNOSIS — D649 Anemia, unspecified: Secondary | ICD-10-CM | POA: Diagnosis not present

## 2018-09-14 DIAGNOSIS — D539 Nutritional anemia, unspecified: Secondary | ICD-10-CM | POA: Insufficient documentation

## 2018-09-14 DIAGNOSIS — R5383 Other fatigue: Secondary | ICD-10-CM

## 2018-09-14 DIAGNOSIS — R5381 Other malaise: Secondary | ICD-10-CM | POA: Insufficient documentation

## 2018-09-14 MED ORDER — VITAMIN C 500 MG PO TABS: 500.0000 mg | ORAL_TABLET | Freq: Every day | ORAL | 3 refills | Status: DC

## 2018-09-14 MED ORDER — FERROUS SULFATE 325 (65 FE) MG PO TABS: 325.0000 mg | ORAL_TABLET | Freq: Two times a day (BID) | ORAL | 3 refills | Status: DC

## 2018-09-14 NOTE — Progress Notes (Signed)
[facility] Note   HPI:  78 y.o. Male presents to clinic for post-op follow-up evaluation for his wound. He had his Cystogram yesterday and there were no leaks. He is doing ok but does report some malaise and fatigue. He is just laying around he says. His output from his ileostomy is < 1200 and he is taking in good liquids. The output is more formed after starting metamucil and he feels that sometimes the output needs to be more.  He is taking some imodium with every meal and bedtime.    He denies any fevers or chills. He has the indwelling foley in place.  His urine has been clear and no reported issues.   Review of Systems:  Malaise and fatigue  Improved ileostomy output  All other review of systems: otherwise negative   Vital Signs:  BP 129/71 (BP Location: Left Arm, Patient Position: Sitting, Cuff Size: Normal)   Pulse 91   Temp (!) 97.3 F (36.3 C) (Temporal)   Resp 18   Wt 181 lb 3.2 oz (82.2 kg)   BMI 24.58 kg/m    Physical Exam:  Physical Exam Vitals signs reviewed.  HENT:     Head: Normocephalic.  Eyes:     Pupils: Pupils are equal, round, and reactive to light.  Neck:     Musculoskeletal: Normal range of motion.  Cardiovascular:     Rate and Rhythm: Normal rate.  Pulmonary:     Effort: Pulmonary effort is normal.  Abdominal:     Comments: Soft, nondistended, wound packed inferiorly, beefy granulation tissue, no erythema, ostomy site with some minor redness, probed, does not track, ostomy pink with formed stool  Neurological:     General: No focal deficit present.     Mental Status: He is alert and oriented to person, place, and time.     Assessment:  78 y.o. yo Male s/p completion colectomy with end ileostomy for colon cancer and bladder injury s/p repair. He is doing well. The cystogram looks good. He does not have follow up with Urology yet.   Plan:  - Can hold some of the imodium as needed if output remaining low and titrate as needed    - Will get CBC, BMP and urinalysis and Urine culture today given the malaise and the fatigue, he had anemia pre op and post operatively, and could be dehydrated. He also has had the foley in place and could have developed a UTI.   - Have started some Iron and Vitamin C for post operative anemia.  - Spoke with Dr. Junious Silk, Urology on Call about the Cystogram, he is going to speak with Dr. Jeffie Pollock who is in the [facility] clinic tomorrow about seeing the patient for a voiding trial. We appreciate the assistance!   - Will see next week for wound check, will also talk about Port placement then after patient sees Dr. Delton Coombes   All of the above recommendations were discussed with the patient and patient's family, and all of patient's and family's questions were answered to their expressed satisfaction.  Curlene Labrum, MD [facility] [address] [place], [place] [address] [phone] (office)

## 2018-09-14 NOTE — Patient Instructions (Addendum)
Continue packing daily. Pack the new area daily for the next few days. The labs will be obtained and we will let you know about the results. We will let you know about what the Urology department says.

## 2018-09-15 ENCOUNTER — Other Ambulatory Visit: Payer: Self-pay | Admitting: Emergency Medicine

## 2018-09-15 DIAGNOSIS — Z978 Presence of other specified devices: Secondary | ICD-10-CM

## 2018-09-15 DIAGNOSIS — Z960 Presence of urogenital implants: Secondary | ICD-10-CM

## 2018-09-15 DIAGNOSIS — R5383 Other fatigue: Principal | ICD-10-CM

## 2018-09-15 DIAGNOSIS — R5381 Other malaise: Secondary | ICD-10-CM

## 2018-09-15 MED ORDER — SULFAMETHOXAZOLE-TRIMETHOPRIM 800-160 MG PO TABS: 1.0000 | ORAL_TABLET | Freq: Two times a day (BID) | ORAL | 0 refills | Status: DC

## 2018-09-15 MED ORDER — FUROSEMIDE 20 MG PO TABS: 20.0000 mg | ORAL_TABLET | Freq: Every day | ORAL | 0 refills | Status: DC

## 2018-09-15 NOTE — Progress Notes (Signed)
Was given a verbal order from doctor bridges to send in bactrim DS to take bid x7s and lasix 20mg  to take for three days sent into patients pharmacy due to lab results. Notified patient of these results and he stated he understood.

## 2018-09-18 LAB — CBC WITH DIFFERENTIAL/PLATELET
Basophils Absolute: 0.1 10*3/uL (ref 0.0–0.2)
Basos: 1 %
EOS (ABSOLUTE): 0.6 10*3/uL — ABNORMAL HIGH (ref 0.0–0.4)
Eos: 7 %
Hematocrit: 38.4 % (ref 37.5–51.0)
Hemoglobin: 11.8 g/dL — ABNORMAL LOW (ref 13.0–17.7)
Immature Grans (Abs): 0 10*3/uL (ref 0.0–0.1)
Immature Granulocytes: 0 %
Lymphocytes Absolute: 1.8 10*3/uL (ref 0.7–3.1)
Lymphs: 20 %
MCH: 26.6 pg (ref 26.6–33.0)
MCHC: 30.7 g/dL — ABNORMAL LOW (ref 31.5–35.7)
MCV: 87 fL (ref 79–97)
Monocytes Absolute: 0.7 10*3/uL (ref 0.1–0.9)
Monocytes: 8 %
Neutrophils Absolute: 5.5 10*3/uL (ref 1.4–7.0)
Neutrophils: 64 %
Platelets: 468 10*3/uL — ABNORMAL HIGH (ref 150–450)
RBC: 4.44 x10E6/uL (ref 4.14–5.80)
RDW: 14.3 % (ref 11.6–15.4)
WBC: 8.7 10*3/uL (ref 3.4–10.8)

## 2018-09-18 LAB — BASIC METABOLIC PANEL
BUN/Creatinine Ratio: 7 — ABNORMAL LOW (ref 10–24)
BUN: 7 mg/dL — ABNORMAL LOW (ref 8–27)
CO2: 24 mmol/L (ref 20–29)
Calcium: 9.2 mg/dL (ref 8.6–10.2)
Chloride: 98 mmol/L (ref 96–106)
Creatinine, Ser: 1 mg/dL (ref 0.76–1.27)
GFR calc Af Amer: 85 mL/min/{1.73_m2} (ref >59)
GFR calc non Af Amer: 73 mL/min/{1.73_m2} (ref >59)
Glucose: 301 mg/dL — ABNORMAL HIGH (ref 65–99)
Potassium: 5.3 mmol/L — ABNORMAL HIGH (ref 3.5–5.2)
Sodium: 135 mmol/L (ref 134–144)

## 2018-09-18 LAB — URINALYSIS, ROUTINE W REFLEX MICROSCOPIC
Bilirubin, UA: NEGATIVE
Ketones, UA: NEGATIVE
Nitrite, UA: POSITIVE — AB
Specific Gravity, UA: 1.025 (ref 1.005–1.030)
Urobilinogen, Ur: 1 mg/dL (ref 0.2–1.0)
pH, UA: 5.5 (ref 5.0–7.5)

## 2018-09-18 LAB — MICROSCOPIC EXAMINATION
Casts: NONE SEEN /lpf
RBC, UA: >30 /hpf — AB (ref 0–2)

## 2018-09-18 LAB — URINE CULTURE

## 2018-09-18 NOTE — Progress Notes (Signed)
Provider gave a verbal order to remove patients foley catheter. Placed 30cc's of saline into patients bladder, deflated balloon, removed foley cath, patient voided  45 minutes after foley was removed, notified provider

## 2018-09-20 ENCOUNTER — Inpatient Hospital Stay (HOSPITAL_COMMUNITY): Payer: Medicare Other

## 2018-09-20 ENCOUNTER — Encounter (HOSPITAL_COMMUNITY): Payer: Self-pay | Admitting: Hematology

## 2018-09-20 ENCOUNTER — Other Ambulatory Visit: Payer: Self-pay

## 2018-09-20 ENCOUNTER — Inpatient Hospital Stay (HOSPITAL_COMMUNITY): Payer: Medicare Other | Attending: Hematology | Admitting: Hematology

## 2018-09-20 VITALS — BP 120/71 | HR 68 | Temp 98.1°F | Resp 18 | Wt 181.0 lb

## 2018-09-20 DIAGNOSIS — Z933 Colostomy status: Secondary | ICD-10-CM | POA: Insufficient documentation

## 2018-09-20 DIAGNOSIS — Z79899 Other long term (current) drug therapy: Secondary | ICD-10-CM | POA: Diagnosis not present

## 2018-09-20 DIAGNOSIS — Z9221 Personal history of antineoplastic chemotherapy: Secondary | ICD-10-CM | POA: Diagnosis not present

## 2018-09-20 DIAGNOSIS — Z923 Personal history of irradiation: Secondary | ICD-10-CM | POA: Insufficient documentation

## 2018-09-20 DIAGNOSIS — C184 Malignant neoplasm of transverse colon: Secondary | ICD-10-CM | POA: Diagnosis not present

## 2018-09-20 DIAGNOSIS — Z85038 Personal history of other malignant neoplasm of large intestine: Secondary | ICD-10-CM | POA: Diagnosis not present

## 2018-09-20 DIAGNOSIS — E1142 Type 2 diabetes mellitus with diabetic polyneuropathy: Secondary | ICD-10-CM | POA: Insufficient documentation

## 2018-09-20 NOTE — Assessment & Plan Note (Signed)
1.  Stage IIb (T4AN0) transverse colon adenocarcinoma: - He had a history of colon cancer in June 2008, status post resection and did not require any adjuvant therapy. - He had a recurrence in February 2009, underwent resection with colostomy.  This was followed by 6 months of FOLFOX based chemotherapy sandwiching radiation. - He noticed blood in the colostomy bag recently. - He underwent a colonoscopy on 08/25/2018 which showed malignant partially obstructing tumor in the mid transverse colon with active bleeding.  Other parts of the colon was normal. -This was biopsied and consistent with adenocarcinoma. - Completion colectomy with end ileostomy, lysis of adhesions and bladder repair on 08/29/2018 by Dr. Constance Haw. - Pathology showed pT4a, PN0, 0/16 lymph nodes positive, MMR normal.  Margins were negative. - I had an extensive discussion with the patient and his family about the new diagnosis. - CT chest with contrast on 08/25/2018 shows small pleural-based nodules of the base of the right lung, largest measuring 5 mm.  These are stable from previous scan from March 2019.  Peripheral lung fibrosis and small calcified nodules present. -CT of the abdomen and pelvis with contrast on 08/21/2018 shows nodular thickening of the mid transverse colon consistent with malignancy.  Unchanged left lower quadrant colostomy. - We will obtain a baseline CEA level.  I have recommended a PET CT scan for staging purposes. -We will plan to have a port placed by Dr. Constance Haw. - Assuming there is no metastatic disease, he will require adjuvant chemotherapy.  We will avoid oxaliplatin because of his pre-existing neuropathy. -We will see him back after the PET CT scan to discuss results and further plan. -We will also obtain MSI testing.  2.  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 -This is improved over time.  Right now he has on and off numbness in the  fingertips and toes.  He has trouble buttoning his shirts.  He has also taken gabapentin in the past. - Hence I would avoid oxaliplatin chemotherapy.

## 2018-09-20 NOTE — Patient Instructions (Signed)
[facility] Discharge Instructions     Thank you for choosing [facility] at [facility] to provide your oncology and hematology care.  To afford each patient quality time with our provider, please arrive at least 15 minutes before your scheduled appointment time.   If you have a lab appointment with the Cancer Center please come in thru the  Main Entrance and check in at the main information desk  You need to re-schedule your appointment should you arrive 10 or more minutes late.  We strive to give you quality time with our providers, and arriving late affects you and other patients whose appointments are after yours.  Also, if you no show three or more times for appointments you may be dismissed from the clinic at the providers discretion.     Again, thank you for choosing [facility].  Our hope is that these requests will decrease the amount of time that you wait before being seen by our physicians.       _____________________________________________________________  Should you have questions after your visit to [facility], please contact our office at [phone] between the hours of 8:00 a.m. and 4:30 p.m.  Voicemails left after 4:00 p.m. will not be returned until the following business day.  For prescription refill requests, have your pharmacy contact our office and allow 72 hours.    Cancer Center Support Programs:   > Cancer Support Group  2nd Tuesday of the month 1pm-2pm, Journey Room    

## 2018-09-20 NOTE — Progress Notes (Signed)
[facility] NOTE  Patient Care Team: Glenda Chroman, MD as PCP - General (Internal Medicine) [name] Alphonse Guild, MD as PCP - Cardiology (Cardiology)  CHIEF COMPLAINTS/PURPOSE OF CONSULTATION: Adenocarcinoma of the colon   HISTORY OF PRESENTING ILLNESS:  Jose Lawrence 78 y.o. male is here because of recurrent colon cancer. He reports a history of colon cancer in June of 2008 where they did a resection of his colon with no chemotherapy or radiation. In February 2009 the cancer returned into his rectum. He had surgery and a colostomy bag at that time. He also received chemo and radiation at that time. He reports he had a skin cancer removed from his chest prior to his colon cancer but can not recall the type. He started noticing blood in his colostomy bag, weakness, and SOB with exertion for about a month prior to having a colonoscopy. At the time of his surgery he received 5 bags of blood due to a drop in his hemoglobin. He is now 3 weeks post surgical resection and has a small amount of serous drainage. They are performing wet to dry dressing changes and they have a follow up appointment with Dr. Constance Haw tomorrow 09/21/2018. Denies any fevers. Denies any nausea, vomiting, or diarrhea. Denies any new pains. Had not noticed any recent bleeding such as epistaxis, hematuria or hematochezia. Denies recent chest pain on exertion, shortness of breath on minimal exertion, pre-syncopal episodes, or palpitations. Denies any numbness or tingling in hands or feet. Denies any recent infections or recent hospitalizations. Patient reports appetite at 100% and energy level at 75%. He reports he smoked from age 61 to 41 years old. He started chewing tobacco after he stopped smoking.  He reports having one paternal uncle with prostate cancer. No other family with known cancers. He is full functioning and able to perform all his own ADLs and activities. 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   MEDICAL HISTORY:  Past Medical History:  Diagnosis Date  . Anemia    Causing interruption in anticoagulation  . Atrial fibrillation and flutter ([place])   . BPH (benign prostatic hypertrophy)   . Colon cancer ([place])    Status post LAR 2008  . Hypothyroidism   . Lacunar infarction ([place]) 11/2012   RT 3rd NERVE PALSY, SB [name]  . PE (pulmonary embolism)    Temporarily on Eliquis 2017  . Skin cancer    35 years ago  . Type 2 diabetes mellitus ([place])     SURGICAL HISTORY: Past Surgical History:  Procedure Laterality Date  . ABDOMINOPERINEAL PROCTOCOLECTOMY  2009   end colostomy   . BIOPSY  08/25/2018   Procedure: BIOPSY;  Surgeon: Rogene Houston, MD;  Location: [facility] SUITE;  Service: Endoscopy;;  . BLADDER REPAIR  08/29/2018   Procedure: BLADDER REPAIR;  Surgeon: [name], MD;  Location: [facility] ORS;  Service: General;;  . COLECTOMY  2008  . COLONOSCOPY WITH PROPOFOL N/A 08/25/2018   Procedure: COLONOSCOPY WITH PROPOFOL;  Surgeon: Rogene Houston, MD;  Location: [facility] SU[facility];  Service: Endoscopy;  Laterality: N/A;  2:20  . COLOSTOMY    . EXPLORATORY LAPAROTOMY  2017   ? pneumoperitoneum of unknown etiology/ negative Ex lap  . ILEOSTOMY  08/29/2018   Procedure: ILEOSTOMY;  Surgeon: [name], MD;  Location: [facility] ORS;  Service: General;;  . LAPAROSCOPIC CHOLECYSTECTOMY  2007  . LYSIS OF ADHESION  08/29/2018   Procedure: LYSIS OF ADHESION;  Surgeon: [name], MD;  Location: [facility] ORS;  Service: General;;  . PARTIAL COLECTOMY N/A 08/29/2018   Procedure: PARTIAL COLECTOMY;  Surgeon: [name], MD;  Location: [facility] ORS;  Service: General;  Laterality: N/A;  . SBO SURGERY  06/2009    SOCIAL HISTORY: Social History   Socioeconomic History  . Marital status: Married    Spouse name: Not on file  . Number of children: Not on file  . Years of education: Not on file   . Highest education level: Not on file  Occupational History  . Occupation: Architect    Comment: [employer]  Social Needs  . Financial resource strain: Not hard at all  . Food insecurity:    Worry: Never true    Inability: Never true  . Transportation needs:    Medical: No    Non-medical: No  Tobacco Use  . Smoking status: Former Smoker    Packs/day: 1.00    Years: 27.00    Pack years: 27.00    Types: Cigarettes    Start date: 01/27/1964    Last attempt to quit: 01/27/1991    Years since quitting: 27.6  . Smokeless tobacco: Former Systems developer    Types: [place] date: 08/10/1995  Substance and Sexual Activity  . Alcohol use: Never    Alcohol/week: 0.0 standard drinks    Frequency: Never  . Drug use: Never  . Sexual activity: Not Currently  Lifestyle  . Physical activity:    Days per week: 0 days    Minutes per session: 0 min  . Stress: Not at all  Relationships  . Social connections:    Talks on phone: Twice a week    Gets together: Once a week    Attends religious service: Not on file    Active member of club or organization: Not on file    Attends meetings of clubs or organizations: Not on file    Relationship status: Not on file  . Intimate partner violence:    Fear of current or ex partner: No    Emotionally abused: No    Physically abused: No    Forced sexual activity: No  Other Topics Concern  . Not on file  Social History Narrative  . Not on file    FAMILY HISTORY: Family History  Problem Relation Age of Onset  . Heart disease Father   . Heart failure Father   . Heart attack Father   . Prostate cancer Paternal Uncle     ALLERGIES:  has No Known Allergies.  MEDICATIONS:  Current Outpatient Medications  Medication Sig Dispense Refill  . aspirin 81 MG tablet Take 81 mg by mouth daily.    . cetirizine (ZYRTEC) 10 MG tablet Take 10 mg by mouth daily.    . ferrous sulfate (FERROUSUL) 325 (65 FE) MG tablet Take 1 tablet (325 mg total) by mouth 2 (two)  times daily with a meal. 60 tablet 3  . glipiZIDE (GLUCOTROL) 10 MG tablet Take 10 mg by mouth 2 (two) times daily.    Marland Kitchen levothyroxine (SYNTHROID, LEVOTHROID) 50 MCG tablet Take 50 mcg by mouth daily before breakfast.    . loperamide (IMODIUM) 2 MG capsule Take 1 capsule (2 mg total) by mouth 4 (four) times daily -  before meals and at bedtime. 120 capsule 3  . metoprolol tartrate (LOPRESSOR) 25 MG tablet Take 1 tablet (  25 mg total) by mouth 2 (two) times daily. 60 tablet 0  . psyllium (HYDROCIL/METAMUCIL) 95 % PACK Take 1 packet by mouth daily. 240 each 3  . sulfamethoxazole-trimethoprim (BACTRIM DS,SEPTRA DS) 800-160 MG tablet Take 1 tablet by mouth 2 (two) times daily. 14 tablet 0  . tamsulosin (FLOMAX) 0.4 MG CAPS capsule Take 0.4 mg by mouth at bedtime.     . vitamin C (ASCORBIC ACID) 500 MG tablet Take 1 tablet (500 mg total) by mouth daily. 30 tablet 3   No current facility-administered medications for this visit.     REVIEW OF SYSTEMS:   Constitutional: Denies fevers, chills or abnormal night sweats Eyes: Denies blurriness of vision, double vision or watery eyes Ears, nose, mouth, throat, and face: Denies mucositis or sore throat Respiratory: Denies cough, dyspnea or wheezes Cardiovascular: Denies palpitation, chest discomfort or lower extremity swelling Gastrointestinal:  Denies nausea, heartburn or change in bowel habits Skin: Denies abnormal skin rashes Lymphatics: Denies new lymphadenopathy or easy bruising Neurological: +numbness and tingling in his feet and fingers Behavioral/Psych: Mood is stable, no new changes  All other systems were reviewed with the patient and are negative.  PHYSICAL EXAMINATION: ECOG PERFORMANCE STATUS: 1 - Symptomatic but completely ambulatory  Vitals:   09/20/18 1325  BP: 120/71  Pulse: 68  Resp: 18  Temp: 98.1 F (36.7 C)  SpO2: 99%   Filed Weights   09/20/18 1325  Weight: 181 lb (82.1 kg)    GENERAL:alert, no distress and  comfortable SKIN: skin color, texture, turgor are normal, no rashes or significant lesions EYES: normal, conjunctiva are pink and non-injected, sclera clear OROPHARYNX:no exudate, no erythema and lips, buccal mucosa, and tongue normal  NECK: supple, thyroid normal size, non-tender, without nodularity LYMPH:  no palpable lymphadenopathy in the cervical, axillary or inguinal LUNGS: clear to auscultation and percussion with normal breathing effort HEART: regular rate & rhythm and no murmurs and no lower extremity edema ABDOMEN:abdomen soft, non-tender and normal bowel sounds.  Ileostomy in place. Musculoskeletal:no cyanosis of digits and no clubbing  PSYCH: alert & oriented x 3 with fluent speech NEURO: no focal motor/sensory deficits  LABORATORY DATA:  I have reviewed the data as listed Lab Results  Component Value Date   WBC 8.7 09/14/2018   HGB 11.8 (L) 09/14/2018   HCT 38.4 09/14/2018   MCV 87 09/14/2018   PLT 468 (H) 09/14/2018     Chemistry      Component Value Date/Time   NA 135 09/14/2018 1514   K 5.3 (H) 09/14/2018 1514   CL 98 09/14/2018 1514   CO2 24 09/14/2018 1514   BUN 7 (L) 09/14/2018 1514   CREATININE 1.00 09/14/2018 1514      Component Value Date/Time   CALCIUM 9.2 09/14/2018 1514   ALKPHOS 87 09/04/2018 0409   AST 19 09/04/2018 0409   ALT 12 09/04/2018 0409   BILITOT 0.8 09/04/2018 0409       RADIOGRAPHIC STUDIES: I have personally reviewed the radiological images as listed and agreed with the findings in the report. Ct Chest W Contrast  Result Date: 08/25/2018 CLINICAL DATA:  Recurrent colon carcinoma. Treated with chemotherapy and radiation therapy 10 years ago. Scheduled for surgery. EXAM: CT CHEST WITH CONTRAST TECHNIQUE: Multidetector CT imaging of the chest was performed during intravenous contrast administration. CONTRAST:  51m OMNIPAQUE IOHEXOL 300 MG/ML  SOLN COMPARISON:  Chest radiographs, 06/21/2018 FINDINGS: Cardiovascular: Heart is normal in  size. No pericardial effusion. Three-vessel coronary artery calcifications. Great vessels  are normal in caliber. Minor aortic atherosclerosis. Mediastinum/Nodes: No enlarged mediastinal, hilar, or axillary lymph nodes. Thyroid gland, trachea, and esophagus demonstrate no significant findings. Lungs/Pleura: Small nodules are noted along the diaphragmatic surface of the right lung base, adjacent to the oblique fissure, largest measuring 5 mm. There are multiple small calcified nodules consistent with healed granuloma most evident in the peripheral right upper lobe. There are scattered areas of peripheral reticular opacities consistent with scarring. No evidence of pneumonia or pulmonary edema. No pleural effusion or pneumothorax. Upper Abdomen: Fat herniates through the esophageal hiatus into the posterior inferior mediastinum. No visualized liver or adrenal masses. There is thickening of the transverse colon that is partly imaged, consistent with the given history of colon carcinoma. No acute findings in the upper abdomen. Musculoskeletal: No acute fracture. No osteoblastic or osteolytic lesions. IMPRESSION: 1. No acute findings. 2. Small pleural based nodules the base of the right lung, largest measuring 5 mm. These are stable compared to the lung bases from an abdomen and pelvis CT dated 10/17/2017 and relatively stable from a CT dated 02/22/2013, therefore benign. 3. 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 4. Coronary artery and aortic atherosclerosis. Aortic Atherosclerosis (ICD10-I70.0). Electronically Signed   By: Lajean Manes M.D.   On: 08/25/2018 16:04   Dg Cystogram  Result Date: 09/13/2018 CLINICAL DATA:  Bladder injury during colon surgery EXAM: CYSTOGRAM TECHNIQUE: After catheterization of the urinary bladder following sterile technique the bladder was filled with 150 mL Omnipaque 300 by drip infusion. Serial spot images were obtained  during bladder filling and post draining. FLUOROSCOPY TIME:  Fluoroscopy Time:  0 minutes 48 seconds Radiation Exposure Index (if provided by the fluoroscopic device): 13.4 mGy Number of Acquired Spot Images: multiple fluoroscopic screen captures COMPARISON:  None Correlation: CT abdomen and pelvis 08/21/2018 FINDINGS: No calculi seen in bladder on precontrast images. Bladder distends normally with smooth contours.  Lumbar puncture Minimal bladder trabeculation. No persistent intraluminal filling defects. No vesicoureteral reflux or extravasation of contrast identified. Post drainage images show no extravasated contrast in pelvis. Bones appear demineralized. IMPRESSION: No evidence of bladder leak or mass. Electronically Signed   By: Lavonia Dana M.D.   On: 09/13/2018 09:48   Nm Myocar Multi W/spect W/wall Motion / Ef  Result Date: 08/28/2018  The study is normal.  This is a low risk study.  The left ventricular ejection fraction is normal (55-65%).  Blood pressure demonstrated a normal response to exercise.  There was no ST segment deviation noted during stress.  Diaphragmatic attenuation no ischemia EF 64% Considered low risk study Ok to proceed with colon surgery at [facility]  I have reviewed Francene Finders, NP's note and agree with the documentation.  I personally performed a face-to-face visit, made revisions and my assessment and plan is as follows.   ASSESSMENT & PLAN:  Cancer of transverse colon ([place]) 1.  Stage IIb (T4AN0) transverse colon adenocarcinoma: - He had a history of colon cancer in June 2008, status post resection and did not require any adjuvant therapy. - He had a recurrence in February 2009, underwent resection with colostomy.  This was followed by 6 months of FOLFOX based chemotherapy sandwiching radiation. - He noticed blood in the colostomy bag recently. - He underwent a colonoscopy on 08/25/2018 which showed malignant partially obstructing tumor in the mid transverse colon  with active bleeding.  Other parts of the colon was normal. -This was biopsied and consistent  with adenocarcinoma. - Completion colectomy with end ileostomy, lysis of adhesions and bladder repair on 08/29/2018 by Dr. Constance Haw. - Pathology showed pT4a, PN0, 0/16 lymph nodes positive, MMR normal.  Margins were negative. - I had an extensive discussion with the patient and his family about the new diagnosis. - CT chest with contrast on 08/25/2018 shows small pleural-based nodules of the base of the right lung, largest measuring 5 mm.  These are stable from previous scan from March 2019.  Peripheral lung fibrosis and small calcified nodules present. -CT of the abdomen and pelvis with contrast on 08/21/2018 shows nodular thickening of the mid transverse colon consistent with malignancy.  Unchanged left lower quadrant colostomy. - We will obtain a baseline CEA level.  I have recommended a PET CT scan for staging purposes. -We will plan to have a port placed by Dr. Constance Haw. - Assuming there is no metastatic disease, he will require adjuvant chemotherapy.  We will avoid oxaliplatin because of his pre-existing neuropathy. -We will see him back after the PET CT scan to discuss results and further plan. -We will also obtain MSI testing.  2.  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 -This is improved over time.  Right now he has on and off numbness in the fingertips and toes.  He has trouble buttoning his shirts.  He has also taken gabapentin in the past. - Hence I would avoid oxaliplatin chemotherapy.  Orders Placed This Encounter  Procedures  . NM PET Image Initial (PI) Skull Base To Thigh    Standing Status:   Future    Standing Expiration Date:   09/20/2019    Order Specific Question:   ** REASON FOR EXAM (FREE TEXT)    Answer:   recurrent colon cancer    Order Specific Question:   If indicated for the ordered procedure, I authorize  the administration of a radiopharmaceutical per Radiology protocol    Answer:   Yes    Order Specific Question:   Preferred imaging location?    Answer:   [facility]    Order Specific Question:   Radiology Contrast Protocol - do NOT remove file path    Answer:   [email or web address]  . CEA    Standing Status:   Future    Number of Occurrences:   1    Standing Expiration Date:   09/20/2019    All questions were answered. The patient knows to call the clinic with any problems, questions or concerns.      Derek Jack, MD 09/20/2018 3:31 PM

## 2018-09-21 ENCOUNTER — Ambulatory Visit (INDEPENDENT_AMBULATORY_CARE_PROVIDER_SITE_OTHER): Payer: Self-pay | Admitting: General Surgery

## 2018-09-21 ENCOUNTER — Encounter: Payer: Self-pay | Admitting: General Surgery

## 2018-09-21 ENCOUNTER — Other Ambulatory Visit (HOSPITAL_COMMUNITY)
Admission: RE | Admit: 2018-09-21 | Discharge: 2018-09-21 | Disposition: A | Discharge status: Home / Self Care | Payer: Medicare Other | Source: Ambulatory Visit | Attending: Hematology | Admitting: Hematology

## 2018-09-21 ENCOUNTER — Encounter (HOSPITAL_COMMUNITY): Payer: Self-pay | Admitting: *Deleted

## 2018-09-21 VITALS — BP 131/77 | HR 69 | Temp 97.3°F | Resp 18 | Wt 180.2 lb

## 2018-09-21 DIAGNOSIS — N3000 Acute cystitis without hematuria: Secondary | ICD-10-CM

## 2018-09-21 DIAGNOSIS — C184 Malignant neoplasm of transverse colon: Secondary | ICD-10-CM | POA: Diagnosis not present

## 2018-09-21 DIAGNOSIS — R5381 Other malaise: Secondary | ICD-10-CM

## 2018-09-21 DIAGNOSIS — R5383 Other fatigue: Secondary | ICD-10-CM

## 2018-09-21 LAB — CEA: CEA: 2 ng/mL (ref 0.0–4.7)

## 2018-09-21 NOTE — Progress Notes (Signed)
[facility] History and Physical   Jose Lawrence is a 78 y.o. male.  HPI:  Patient is well known to me s/p completion colectomy for a second primary colon cancer. He underwent colectomy and end ileostomy placement, and has been healing post operatively. He has been seen by Oncology and will start treatments in about 6 weeks.  He has been healing and his wounds are healing. The inferior portion of his wound had to be packed but this is going well. He also had his foley removed last Friday and was treated for a UTI.  He is overall feeling better and denies the malaise he was experiencing last time. He says he is keeping up with is intake and ileostomy output. He is using metamucil and imodium for volume control.    He is starting to move around better and feels that he is getting stronger. He will need a port placed for chemotherapy.   Past Medical History:  Diagnosis Date  . Anemia    Causing interruption in anticoagulation  . Atrial fibrillation and flutter ([place])   . BPH (benign prostatic hypertrophy)   . Colon cancer ([place])    Status post LAR 2008  . Hypothyroidism   . Lacunar infarction ([place]) 11/2012   RT 3rd NERVE PALSY, SB [name]  . PE (pulmonary embolism)    Temporarily on Eliquis 2017  . Skin cancer    35 years ago  . Type 2 diabetes mellitus ([place])     Past Surgical History:  Procedure Laterality Date  . ABDOMINOPERINEAL PROCTOCOLECTOMY  2009   end colostomy   . BIOPSY  08/25/2018   Procedure: BIOPSY;  Surgeon: Rogene Houston, MD;  Location: [facility];  Service: Endoscopy;;  . BLADDER REPAIR  08/29/2018   Procedure: BLADDER REPAIR;  Surgeon: [name], MD;  Location: [facility] ORS;  Service: General;;  . COLECTOMY  2008  . COLONOSCOPY WITH PROPOFOL N/A 08/25/2018   Procedure: COLONOSCOPY WITH PROPOFOL;  Surgeon: Rogene Houston, MD;  Location: [facility];  Service: Endoscopy;  Laterality: N/A;  2:20  . COLOSTOMY    . EXPLORATORY  LAPAROTOMY  2017   ? pneumoperitoneum of unknown etiology/ negative Ex lap  . ILEOSTOMY  08/29/2018   Procedure: ILEOSTOMY;  Surgeon: [name], MD;  Location: [facility] OR[facility];  Service: General;;  . LAPAROSCOPIC CHOLECYSTECTOMY  2007  . LYSIS OF ADHESION  08/29/2018   Procedure: LYSIS OF ADHESION;  Surgeon: [name], MD;  Location: [facility];  Service: General;;  . PARTIAL COLECTOMY N/A 08/29/2018   Procedure: PARTIAL COLECTOMY;  Surgeon: [name], MD;  Location: [facility] OR[facility];  Service: General;  Laterality: N/A;  . SBO SURGERY  06/2009    Family History  Problem Relation Age of Onset  . Heart disease Father   . Heart failure Father   . Heart attack Father   . Prostate cancer Paternal Uncle     Social History   Tobacco Use  . Smoking status: Former Smoker    Packs/day: 1.00    Years: 27.00    Pack years: 27.00    Types: Cigarettes    Start date: 01/27/1964    Last attempt to quit: 01/27/1991    Years since quitting: 27.6  . Smokeless tobacco: Former Systems developer    Types: Chew    Quit date: 08/10/1995  Substance Use Topics  . Alcohol use: Never    Alcohol/week: 0.0 standard drinks    Frequency: Never  .  Drug use: Never    Medications: I have reviewed the patient's current medications. Allergies as of 09/21/2018   No Known Allergies     Medication List       Accurate as of September 21, 2018  2:43 PM. Always use your most recent med list.        aspirin 81 MG tablet Take 81 mg by mouth daily.   cetirizine 10 MG tablet Commonly known as:  ZYRTEC Take 10 mg by mouth daily.   ferrous sulfate 325 (65 FE) MG tablet Commonly known as:  FERROUSUL Take 1 tablet (325 mg total) by mouth 2 (two) times daily with a meal.   glipiZIDE 10 MG tablet Commonly known as:  GLUCOTROL Take 10 mg by mouth 2 (two) times daily.   levothyroxine 50 MCG tablet Commonly known as:  SYNTHROID, LEVOTHROID Take 50 mcg by mouth daily before breakfast.   loperamide 2 MG  capsule Commonly known as:  IMODIUM Take 1 capsule (2 mg total) by mouth 4 (four) times daily -  before meals and at bedtime.   metoprolol tartrate 25 MG tablet Commonly known as:  LOPRESSOR Take 1 tablet (25 mg total) by mouth 2 (two) times daily.   psyllium 95 % Pack Commonly known as:  HYDROCIL/METAMUCIL Take 1 packet by mouth daily.   sulfamethoxazole-trimethoprim 800-160 MG tablet Commonly known as:  BACTRIM DS,SEPTRA DS Take 1 tablet by mouth 2 (two) times daily.   tamsulosin 0.4 MG Caps capsule Commonly known as:  FLOMAX Take 0.4 mg by mouth at bedtime.   vitamin C 500 MG tablet Commonly known as:  ASCORBIC ACID Take 1 tablet (500 mg total) by mouth daily.        ROS:  A comprehensive review of systems was negative except for: Gastrointestinal: positive for abdominal pain and decreased ostomy output Genitourinary: positive for decreased frequency, no issues with retention  Blood pressure 131/77, pulse 69, temperature (!) 97.3 F (36.3 C), temperature source Temporal, resp. rate 18, weight 180 lb 3.2 oz (81.7 kg). Physical Exam Vitals signs reviewed.  HENT:     Head: Normocephalic and atraumatic.     Nose: Nose normal.     Mouth/Throat:     Mouth: Mucous membranes are moist.  Eyes:     Pupils: Pupils are equal, round, and reactive to light.  Neck:     Musculoskeletal: Normal range of motion.  Cardiovascular:     Rate and Rhythm: Normal rate and regular rhythm.  Pulmonary:     Effort: Pulmonary effort is normal.     Breath sounds: Normal breath sounds.  Abdominal:     General: Bowel sounds are normal. There is no distension.     Palpations: Abdomen is soft.     Tenderness: There is no abdominal tenderness.     Comments: Midline healing, inferior part with granulation tissue, no erythema or drainage, ostomy pink and healthy, some skin irritation form the acidic output of the ostomy  Musculoskeletal: Normal range of motion.        General: No swelling.   Skin:    General: Skin is warm and dry.  Neurological:     General: No focal deficit present.     Mental Status: He is alert and oriented to person, place, and time.  Psychiatric:        Mood and Affect: Mood normal.        Behavior: Behavior normal.        Thought Content: Thought content normal.  Judgment: Judgment normal.     Results: Results for orders placed or performed in visit on 09/20/18 (from the past 48 hour(s))  CEA     Status: None   Collection Time: 09/20/18  2:46 PM  Result Value Ref Range   CEA 2.0 0.0 - 4.7 ng/mL    Comment: (NOTE)                             Nonsmokers          <3.9                             Smokers             <5.6 Roche [employer] Electrochemiluminescence Immunoassay (ECLIA) Values obtained with different assay methods or kits cannot be used interchangeably.  Results cannot be interpreted as absolute evidence of the presence or absence of malignant disease. Performed At: [facility] [address] [place], [place] [id number] Rush Farmer MD AG:[id number]       Assessment & Plan:  BURLIE CAJAMARCA is a 78 y.o. male with colon cancer s/p completion colectomy and end ileostomy. Doing well. Healing and managing his ostomy output. Will need a port.  Complete your antibiotic for your urinary track infection.  Dressing daily as you have been doing. Port placement on 10/04/2018. Will try for the left, his prior port was on the right and he is right handed.  Will check your wound then.  Ostomy paste will be called into [facility]. This should help to seal the ostomy appliance better. If this does not help, we can sent to ostomy RNs in [place] versus trial convex ostomy appliance   All questions were answered to the satisfaction of the patient and family.  The risk and benefits of port a catheter placement were discussed including but not limited to bleeding, infection, malfunction, pneumothorax.  After careful  consideration, Jose Lawrence has decided to proceed.    [name] 09/21/2018, 2:43 PM

## 2018-09-21 NOTE — Patient Instructions (Signed)
Complete your antibiotic for your urinary track infection.  Dressing daily as you have been doing. Port placement on 10/04/2018. Will check your wound then.  Ostomy paste will be called into [facility].   Implanted Port Insertion Implanted port insertion is a procedure to put in a port and catheter. The port is a device with an injectable disk that can be accessed by your health care provider. The port is connected to a vein in the chest or neck by a small flexible tube (catheter). There are different types of ports. The implanted port may be used as a long-term IV access for:  Medicines, such as chemotherapy.  Fluids.  Liquid nutrition, such as total parenteral nutrition (TPN). When you have a port, this means that your health care provider will not need to use the veins in your arms for these procedures. Tell a health care provider about:  Any allergies you have.  All medicines you are taking, especially blood thinners, as well as any vitamins, herbs, eye drops, creams, over-the-counter medicines, and steroids.  Any problems you or family members have had with anesthetic medicines.  Any blood disorders you have.  Any surgeries you have had.  Any medical conditions you have or have had, including diabetes or kidney problems.  Whether you are pregnant or may be pregnant. What are the risks? Generally, this is a safe procedure. However, problems may occur, including:  Allergic reactions to medicines or dyes.  Damage to other structures or organs.  Infection.  Damage to the blood vessel, bruising, or bleeding at the puncture site.  Blood clot.  Breakdown of the skin over the port.  A collection of air in the chest that can cause one of the lungs to collapse (pneumothorax). This is rare. What happens before the procedure? Medicines  Ask your health care provider about: ? Changing or stopping your regular medicines. This is especially important if you are taking  diabetes medicines or blood thinners. ? Taking medicines such as aspirin and ibuprofen. These medicines can thin your blood. Do not take these medicines unless your health care provider tells you to take them. ? Taking over-the-counter medicines, vitamins, herbs, and supplements. General instructions  Plan to have someone take you home from the hospital or clinic.  If you will be going home right after the procedure, plan to have someone with you for 24 hours.  You may have blood tests.  Do not use any products that contain nicotine or tobacco for at least 4-6 weeks before the procedure. These products include cigarettes, e-cigarettes, and chewing tobacco. If you need help quitting, ask your health care provider.  Ask your health care provider what steps will be taken to help prevent infection. These may include: ? Removing hair at the surgery site. ? Washing skin with a germ-killing soap. ? Taking antibiotic medicine. What happens during the procedure?   An IV will be inserted into one of your veins.  You will be given one or more of the following: ? A medicine to help you relax (sedative). ? A medicine to numb the area (local anesthetic).  Two small incisions will be made to insert the port. ? One smaller incision will be made in your neck to get access to the vein where the catheter will lie. ? The other incision will be made in the upper chest. This is where the port will lie.  The procedure may be done using continuous X-ray (fluoroscopy) or other imaging tools for guidance.  The port and catheter will be placed. There may be a small, raised area where the port is.  The port will be flushed with a salt solution (saline), and blood will be drawn to make sure that it is working correctly.  The incisions will be closed.  Bandages (dressings) may be placed over the incisions. The procedure may vary among health care providers and hospitals. What happens after the  procedure?  Your blood pressure, heart rate, breathing rate, and blood oxygen level will be monitored until you leave the hospital or clinic.  Do not drive for 24 hours if you were given a sedative during your procedure.  You will be given a manufacturer's information card for the type of port that you have. Keep this with you.  Your port will need to be flushed and checked as told by your health care provider, usually every few weeks.  A chest X-ray will be done to: ? Check the placement of the port. ? Make sure there is no injury to your lung. Summary  Implanted port insertion is a procedure to put in a port and catheter.  The implanted port is used as a long-term IV access.  The port will need to be flushed and checked as told by your health care provider, usually every few weeks.  Keep your manufacturer's information card with you at all times. This information is not intended to replace advice given to you by your health care provider. Make sure you discuss any questions you have with your health care provider. Document Released: 05/16/2013 Document Revised: 02/21/2018 Document Reviewed: 02/21/2018 Elsevier Interactive Patient Education  2019 [employer] An implanted port is a device that is placed under the skin. It is usually placed in the chest. The device can be used to give IV medicine, to take blood, or for dialysis. You may have an implanted port if:  You need IV medicine that would be irritating to the small veins in your hands or arms.  You need IV medicines, such as antibiotics, for a long period of time.  You need IV nutrition for a long period of time.  You need dialysis. Having a port means that your health care provider will not need to use the veins in your arms for these procedures. You may have fewer limitations when using a port than you would if you used other types of long-term IVs, and you will likely be able to return to  normal activities after your incision heals. An implanted port has two main parts:  Reservoir. The reservoir is the part where a needle is inserted to give medicines or draw blood. The reservoir is round. After it is placed, it appears as a small, raised area under your skin.  Catheter. The catheter is a thin, flexible tube that connects the reservoir to a vein. Medicine that is inserted into the reservoir goes into the catheter and then into the vein. How is my port accessed? To access your port:  A numbing cream may be placed on the skin over the port site.  Your health care provider will put on a mask and sterile gloves.  The skin over your port will be cleaned carefully with a germ-killing soap and allowed to dry.  Your health care provider will gently pinch the port and insert a needle into it.  Your health care provider will check for a blood return to make sure the port is in the vein and  is not clogged.  If your port needs to remain accessed to get medicine continuously (constant infusion), your health care provider will place a clear bandage (dressing) over the needle site. The dressing and needle will need to be changed every week, or as told by your health care provider. What is flushing? Flushing helps keep the port from getting clogged. Follow instructions from your health care provider about how and when to flush the port. Ports are usually flushed with saline solution or a medicine called heparin. The need for flushing will depend on how the port is used:  If the port is only used from time to time to give medicines or draw blood, the port may need to be flushed: ? Before and after medicines have been given. ? Before and after blood has been drawn. ? As part of routine maintenance. Flushing may be recommended every 4-6 weeks.  If a constant infusion is running, the port may not need to be flushed.  Throw away any syringes in a disposal container that is meant for sharp  items (sharps container). You can buy a sharps container from a pharmacy, or you can make one by using an empty hard plastic bottle with a cover. How long will my port stay implanted? The port can stay in for as long as your health care provider thinks it is needed. When it is time for the port to come out, a surgery will be done to remove it. The surgery will be similar to the procedure that was done to put the port in. Follow these instructions at home:   Flush your port as told by your health care provider.  If you need an infusion over several days, follow instructions from your health care provider about how to take care of your port site. Make sure you: ? Wash your hands with soap and water before you change your dressing. If soap and water are not available, use alcohol-based hand sanitizer. ? Change your dressing as told by your health care provider. ? Place any used dressings or infusion bags into a plastic bag. Throw that bag in the trash. ? Keep the dressing that covers the needle clean and dry. Do not get it wet. ? Do not use scissors or sharp objects near the tube. ? Keep the tube clamped, unless it is being used.  Check your port site every day for signs of infection. Check for: ? Redness, swelling, or pain. ? Fluid or blood. ? Pus or a bad smell.  Protect the skin around the port site. ? Avoid wearing bra straps that rub or irritate the site. ? Protect the skin around your port from seat belts. Place a soft pad over your chest if needed.  Bathe or shower as told by your health care provider. The site may get wet as long as you are not actively receiving an infusion.  Return to your normal activities as told by your health care provider. Ask your health care provider what activities are safe for you.  Carry a medical alert card or wear a medical alert bracelet at all times. This will let health care providers know that you have an implanted port in case of an  emergency. Get help right away if:  You have redness, swelling, or pain at the port site.  You have fluid or blood coming from your port site.  You have pus or a bad smell coming from the port site.  You have a fever. Summary  Implanted ports are usually placed in the chest for long-term IV access.  Follow instructions from your health care provider about flushing the port and changing bandages (dressings).  Take care of the area around your port by avoiding clothing that puts pressure on the area, and by watching for signs of infection.  Protect the skin around your port from seat belts. Place a soft pad over your chest if needed.  Get help right away if you have a fever or you have redness, swelling, pain, drainage, or a bad smell at the port site. This information is not intended to replace advice given to you by your health care provider. Make sure you discuss any questions you have with your health care provider. Document Released: 07/26/2005 Document Revised: 08/28/2016 Document Reviewed: 08/28/2016 Elsevier Interactive Patient Education  2019 [employer].

## 2018-09-21 NOTE — Progress Notes (Signed)
I spoke with Suanne Marker in pathology and ordered MSI testing on accession # [id number].

## 2018-09-23 NOTE — H&P (Signed)
[facility] History and Physical  Jose Lawrence is a 78 y.o. male.  HPI:  Patient is well known to me s/p completion colectomy for a second primary colon cancer. He underwent colectomy and end ileostomy placement, and has been healing post operatively. He has been seen by Oncology and will start treatments in about 6 weeks.  He has been healing and his wounds are healing. The inferior portion of his wound had to be packed but this is going well. He also had his foley removed last Friday and was treated for a UTI.  He is overall feeling better and denies the malaise he was experiencing last time. He says he is keeping up with is intake and ileostomy output. He is using metamucil and imodium for volume control.    He is starting to move around better and feels that he is getting stronger. He will need a port placed for chemotherapy.       Past Medical History:  Diagnosis Date  . Anemia    Causing interruption in anticoagulation  . Atrial fibrillation and flutter ([place])   . BPH (benign prostatic hypertrophy)   . Colon cancer ([place])    Status post LAR 2008  . Hypothyroidism   . Lacunar infarction ([place]) 11/2012   RT 3rd NERVE PALSY, SB [name]  . PE (pulmonary embolism)    Temporarily on Eliquis 2017  . Skin cancer    35 years ago  . Type 2 diabetes mellitus ([place])          Past Surgical History:  Procedure Laterality Date  . ABDOMINOPERINEAL PROCTOCOLECTOMY  2009   end colostomy   . BIOPSY  08/25/2018   Procedure: BIOPSY;  Surgeon: Rogene Houston, MD;  Location: [facility];  Service: Endoscopy;;  . BLADDER REPAIR  08/29/2018   Procedure: BLADDER REPAIR;  Surgeon: [name], MD;  Location: [facility] ORS;  Service: General;;  . COLECTOMY  2008  . COLONOSCOPY WITH PROPOFOL N/A 08/25/2018   Procedure: COLONOSCOPY WITH PROPOFOL;  Surgeon: Rogene Houston, MD;  Location: [facility];  Service: Endoscopy;  Laterality: N/A;  2:20  .  COLOSTOMY    . EXPLORATORY LAPAROTOMY  2017   ? pneumoperitoneum of unknown etiology/ negative Ex lap  . ILEOSTOMY  08/29/2018   Procedure: ILEOSTOMY;  Surgeon: [name], MD;  Location: [facility];  Service: General;;  . LAPAROSCOPIC CHOLECYSTECTOMY  2007  . LYSIS OF ADHESION  08/29/2018   Procedure: LYSIS OF ADHESION;  Surgeon: [name], MD;  Location: [facility];  Service: General;;  . PARTIAL COLECTOMY N/A 08/29/2018   Procedure: PARTIAL COLECTOMY;  Surgeon: [name], MD;  Location: [facility];  Service: General;  Laterality: N/A;  . SBO SURGERY  06/2009         Family History  Problem Relation Age of Onset  . Heart disease Father   . Heart failure Father   . Heart attack Father   . Prostate cancer Paternal Uncle     Social History        Tobacco Use  . Smoking status: Former Smoker    Packs/day: 1.00    Years: 27.00    Pack years: 27.00    Types: Cigarettes    Start date: 01/27/1964    Last attempt to quit: 01/27/1991    Years since quitting: 27.6  . Smokeless tobacco: Former Systems developer    Types: [place] date: 08/10/1995  Substance Use  Topics  . Alcohol use: Never    Alcohol/week: 0.0 standard drinks    Frequency: Never  . Drug use: Never    Medications: I have reviewed the patient's current medications. Allergies as of 09/21/2018   No Known Allergies        Medication List       Accurate as of September 21, 2018  2:43 PM. Always use your most recent med list.        aspirin 81 MG tablet Take 81 mg by mouth daily.   cetirizine 10 MG tablet Commonly known as:  ZYRTEC Take 10 mg by mouth daily.   ferrous sulfate 325 (65 FE) MG tablet Commonly known as:  FERROUSUL Take 1 tablet (325 mg total) by mouth 2 (two) times daily with a meal.   glipiZIDE 10 MG tablet Commonly known as:  GLUCOTROL Take 10 mg by mouth 2 (two) times daily.   levothyroxine 50 MCG tablet Commonly known as:   SYNTHROID, LEVOTHROID Take 50 mcg by mouth daily before breakfast.   loperamide 2 MG capsule Commonly known as:  IMODIUM Take 1 capsule (2 mg total) by mouth 4 (four) times daily -  before meals and at bedtime.   metoprolol tartrate 25 MG tablet Commonly known as:  LOPRESSOR Take 1 tablet (25 mg total) by mouth 2 (two) times daily.   psyllium 95 % Pack Commonly known as:  HYDROCIL/METAMUCIL Take 1 packet by mouth daily.   sulfamethoxazole-trimethoprim 800-160 MG tablet Commonly known as:  BACTRIM DS,SEPTRA DS Take 1 tablet by mouth 2 (two) times daily.   tamsulosin 0.4 MG Caps capsule Commonly known as:  FLOMAX Take 0.4 mg by mouth at bedtime.   vitamin C 500 MG tablet Commonly known as:  ASCORBIC ACID Take 1 tablet (500 mg total) by mouth daily.        ROS:  A comprehensive review of systems was negative except for: Gastrointestinal: positive for abdominal pain and decreased ostomy output Genitourinary: positive for decreased frequency, no issues with retention  Blood pressure 131/77, pulse 69, temperature (!) 97.3 F (36.3 C), temperature source Temporal, resp. rate 18, weight 180 lb 3.2 oz (81.7 kg). Physical Exam Vitals signs reviewed.  HENT:     Head: Normocephalic and atraumatic.     Nose: Nose normal.     Mouth/Throat:     Mouth: Mucous membranes are moist.  Eyes:     Pupils: Pupils are equal, round, and reactive to light.  Neck:     Musculoskeletal: Normal range of motion.  Cardiovascular:     Rate and Rhythm: Normal rate and regular rhythm.  Pulmonary:     Effort: Pulmonary effort is normal.     Breath sounds: Normal breath sounds.  Abdominal:     General: Bowel sounds are normal. There is no distension.     Palpations: Abdomen is soft.     Tenderness: There is no abdominal tenderness.     Comments: Midline healing, inferior part with granulation tissue, no erythema or drainage, ostomy pink and healthy, some skin irritation form the acidic  output of the ostomy  Musculoskeletal: Normal range of motion.        General: No swelling.  Skin:    General: Skin is warm and dry.  Neurological:     General: No focal deficit present.     Mental Status: He is alert and oriented to person, place, and time.  Psychiatric:        Mood and Affect: Mood  normal.        Behavior: Behavior normal.        Thought Content: Thought content normal.        Judgment: Judgment normal.     Results: LabResultsLast48Hours        Results for orders placed or performed in visit on 09/20/18 (from the past 48 hour(s))  CEA     Status: None   Collection Time: 09/20/18  2:46 PM  Result Value Ref Range   CEA 2.0 0.0 - 4.7 ng/mL    Comment: (NOTE)                             Nonsmokers          <3.9                             Smokers             <5.6 Roche [employer] Electrochemiluminescence Immunoassay (ECLIA) Values obtained with different assay methods or kits cannot be used interchangeably.  Results cannot be interpreted as absolute evidence of the presence or absence of malignant disease. Performed At: [facility] [address] [place], [place] [id number] Rush Farmer MD WH:[id number]         Assessment & Plan:  SAVVAS ROPER is a 78 y.o. male with colon cancer s/p completion colectomy and end ileostomy. Doing well. Healing and managing his ostomy output. Will need a port.  Complete your antibiotic for your urinary track infection.  Dressing daily as you have been doing. Port placement on 10/04/2018. Will try for the left, his prior port was on the right and he is right handed.  Will check your wound then.  Ostomy paste will be called into [facility]. This should help to seal the ostomy appliance better. If this does not help, we can sent to ostomy RNs in [place] versus trial convex ostomy appliance   All questions were answered to the satisfaction of the patient and family.  The risk and  benefits of port a catheter placement were discussed including but not limited to bleeding, infection, malfunction, pneumothorax.  After careful consideration, COSBY PROBY has decided to proceed.    [name] 09/21/2018, 2:43 PM

## 2018-09-27 DIAGNOSIS — I4891 Unspecified atrial fibrillation: Secondary | ICD-10-CM | POA: Diagnosis not present

## 2018-09-27 DIAGNOSIS — Z6825 Body mass index (BMI) 25.0-25.9, adult: Secondary | ICD-10-CM | POA: Diagnosis not present

## 2018-09-27 DIAGNOSIS — I10 Essential (primary) hypertension: Secondary | ICD-10-CM | POA: Diagnosis not present

## 2018-09-27 DIAGNOSIS — R350 Frequency of micturition: Secondary | ICD-10-CM | POA: Diagnosis not present

## 2018-09-27 DIAGNOSIS — Z299 Encounter for prophylactic measures, unspecified: Secondary | ICD-10-CM | POA: Diagnosis not present

## 2018-09-27 DIAGNOSIS — N390 Urinary tract infection, site not specified: Secondary | ICD-10-CM | POA: Diagnosis not present

## 2018-09-27 NOTE — Patient Instructions (Signed)
Jose Lawrence  09/27/2018     @PREFPERIOPPHARMACY @   Your procedure is scheduled on  10/04/2018.  Report to [facility] at  950   A.M.  Call this number if you have problems the morning of surgery:  [phone]   Remember:  Do not eat or drink after midnight.                        Take these medicines the morning of surgery with A SIP OF WATER  Zyrtec, levothyroxine, metoprolol.    Do not wear jewelry, make-up or nail polish.  Do not wear lotions, powders, or perfumes, or deodorant.  Do not shave 48 hours prior to surgery.  Men may shave face and neck.  Do not bring valuables to the hospital.  [facility] is not responsible for any belongings or valuables.  Contacts, dentures or bridgework may not be worn into surgery.  Leave your suitcase in the car.  After surgery it may be brought to your room.  For patients admitted to the hospital, discharge time will be determined by your treatment team.  Patients discharged the day of surgery will not be allowed to drive home.   Name and phone number of your driver:   family Special instructions:  None  Please read over the following fact sheets that you were given. Anesthesia Post-op Instructions and Care and Recovery After Surgery       Implanted [facility] Guide An implanted port is a device that is placed under the skin. It is usually placed in the chest. The device can be used to give IV medicine, to take blood, or for dialysis. You may have an implanted port if:  You need IV medicine that would be irritating to the small veins in your hands or arms.  You need IV medicines, such as antibiotics, for a long period of time.  You need IV nutrition for a long period of time.  You need dialysis. Having a port means that your health care provider will not need to use the veins in your arms for these procedures. You may have fewer limitations when using a port than you would if you used other types of long-term  IVs, and you will likely be able to return to normal activities after your incision heals. An implanted port has two main parts:  Reservoir. The reservoir is the part where a needle is inserted to give medicines or draw blood. The reservoir is round. After it is placed, it appears as a small, raised area under your skin.  Catheter. The catheter is a thin, flexible tube that connects the reservoir to a vein. Medicine that is inserted into the reservoir goes into the catheter and then into the vein. How is my port accessed? To access your port:  A numbing cream may be placed on the skin over the port site.  Your health care provider will put on a mask and sterile gloves.  The skin over your port will be cleaned carefully with a germ-killing soap and allowed to dry.  Your health care provider will gently pinch the port and insert a needle into it.  Your health care provider will check for a blood return to make sure the port is in the vein and is not clogged.  If your port needs to remain accessed to get medicine continuously (constant infusion), your health care provider will place a clear  bandage (dressing) over the needle site. The dressing and needle will need to be changed every week, or as told by your health care provider. What is flushing? Flushing helps keep the port from getting clogged. Follow instructions from your health care provider about how and when to flush the port. Ports are usually flushed with saline solution or a medicine called heparin. The need for flushing will depend on how the port is used:  If the port is only used from time to time to give medicines or draw blood, the port may need to be flushed: ? Before and after medicines have been given. ? Before and after blood has been drawn. ? As part of routine maintenance. Flushing may be recommended every 4-6 weeks.  If a constant infusion is running, the port may not need to be flushed.  Throw away any syringes in a  disposal container that is meant for sharp items (sharps container). You can buy a sharps container from a pharmacy, or you can make one by using an empty hard plastic bottle with a cover. How long will my port stay implanted? The port can stay in for as long as your health care provider thinks it is needed. When it is time for the port to come out, a surgery will be done to remove it. The surgery will be similar to the procedure that was done to put the port in. Follow these instructions at home:   Flush your port as told by your health care provider.  If you need an infusion over several days, follow instructions from your health care provider about how to take care of your port site. Make sure you: ? Wash your hands with soap and water before you change your dressing. If soap and water are not available, use alcohol-based hand sanitizer. ? Change your dressing as told by your health care provider. ? Place any used dressings or infusion bags into a plastic bag. Throw that bag in the trash. ? Keep the dressing that covers the needle clean and dry. Do not get it wet. ? Do not use scissors or sharp objects near the tube. ? Keep the tube clamped, unless it is being used.  Check your port site every day for signs of infection. Check for: ? Redness, swelling, or pain. ? Fluid or blood. ? Pus or a bad smell.  Protect the skin around the port site. ? Avoid wearing bra straps that rub or irritate the site. ? Protect the skin around your port from seat belts. Place a soft pad over your chest if needed.  Bathe or shower as told by your health care provider. The site may get wet as long as you are not actively receiving an infusion.  Return to your normal activities as told by your health care provider. Ask your health care provider what activities are safe for you.  Carry a medical alert card or wear a medical alert bracelet at all times. This will let health care providers know that you have an  implanted port in case of an emergency. Get help right away if:  You have redness, swelling, or pain at the port site.  You have fluid or blood coming from your port site.  You have pus or a bad smell coming from the port site.  You have a fever. Summary  Implanted ports are usually placed in the chest for long-term IV access.  Follow instructions from your health care provider about flushing the port and  changing bandages (dressings).  Take care of the area around your port by avoiding clothing that puts pressure on the area, and by watching for signs of infection.  Protect the skin around your port from seat belts. Place a soft pad over your chest if needed.  Get help right away if you have a fever or you have redness, swelling, pain, drainage, or a bad smell at the port site. This information is not intended to replace advice given to you by your health care provider. Make sure you discuss any questions you have with your health care provider. Document Released: 07/26/2005 Document Revised: 08/28/2016 Document Reviewed: 08/28/2016 Elsevier Interactive Patient Education  2019 [place] Insertion, Care After This sheet gives you information about how to care for yourself after your procedure. Your health care provider may also give you more specific instructions. If you have problems or questions, contact your health care provider. What can I expect after the procedure? After the procedure, it is common to have:  Discomfort at the port insertion site.  Bruising on the skin over the port. This should improve over 3-4 days. Follow these instructions at home: [facility] care  After your port is placed, you will get a manufacturer's information card. The card has information about your port. Keep this card with you at all times.  Take care of the port as told by your health care provider. Ask your health care provider if you or a family member can get training for  taking care of the port at home. A home health care nurse may also take care of the port.  Make sure to remember what type of port you have. Incision care      Follow instructions from your health care provider about how to take care of your port insertion site. Make sure you: ? Wash your hands with soap and water before and after you change your bandage (dressing). If soap and water are not available, use hand sanitizer. ? Change your dressing as told by your health care provider. ? Leave stitches (sutures), skin glue, or adhesive strips in place. These skin closures may need to stay in place for 2 weeks or longer. If adhesive strip edges start to loosen and curl up, you may trim the loose edges. Do not remove adhesive strips completely unless your health care provider tells you to do that.  Check your port insertion site every day for signs of infection. Check for: ? Redness, swelling, or pain. ? Fluid or blood. ? Warmth. ? Pus or a bad smell. Activity  Return to your normal activities as told by your health care provider. Ask your health care provider what activities are safe for you.  Do not lift anything that is heavier than 10 lb (4.5 kg), or the limit that you are told, until your health care provider says that it is safe. General instructions  Take over-the-counter and prescription medicines only as told by your health care provider.  Do not take baths, swim, or use a hot tub until your health care provider approves. Ask your health care provider if you may take showers. You may only be allowed to take sponge baths.  Do not drive for 24 hours if you were given a sedative during your procedure.  Wear a medical alert bracelet in case of an emergency. This will tell any health care providers that you have a port.  Keep all follow-up visits as told by your health care provider.  This is important. Contact a health care provider if:  You cannot flush your port with saline as  directed, or you cannot draw blood from the port.  You have a fever or chills.  You have redness, swelling, or pain around your port insertion site.  You have fluid or blood coming from your port insertion site.  Your port insertion site feels warm to the touch.  You have pus or a bad smell coming from the port insertion site. Get help right away if:  You have chest pain or shortness of breath.  You have bleeding from your port that you cannot control. Summary  Take care of the port as told by your health care provider. Keep the manufacturer's information card with you at all times.  Change your dressing as told by your health care provider.  Contact a health care provider if you have a fever or chills or if you have redness, swelling, or pain around your port insertion site.  Keep all follow-up visits as told by your health care provider. This information is not intended to replace advice given to you by your health care provider. Make sure you discuss any questions you have with your health care provider. Document Released: 05/16/2013 Document Revised: 02/21/2018 Document Reviewed: 02/21/2018 Elsevier Interactive Patient Education  2019 [place], Care After These instructions provide you with information about caring for yourself after your procedure. Your health care provider may also give you more specific instructions. Your treatment has been planned according to current medical practices, but problems sometimes occur. Call your health care provider if you have any problems or questions after your procedure. What can I expect after the procedure? After your procedure, you may:  Feel sleepy for several hours.  Feel clumsy and have poor balance for several hours.  Feel forgetful about what happened after the procedure.  Have poor judgment for several hours.  Feel nauseous or vomit.  Have a sore throat if you had a breathing tube during the  procedure. Follow these instructions at home: For at least 24 hours after the procedure:      Have a responsible adult stay with you. It is important to have someone help care for you until you are awake and alert.  Rest as needed.  Do not: ? Participate in activities in which you could fall or become injured. ? Drive. ? Use heavy machinery. ? Drink alcohol. ? Take sleeping pills or medicines that cause drowsiness. ? Make important decisions or sign legal documents. ? Take care of children on your own. Eating and drinking  Follow the diet that is recommended by your health care provider.  If you vomit, drink water, juice, or soup when you can drink without vomiting.  Make sure you have little or no nausea before eating solid foods. General instructions  Take over-the-counter and prescription medicines only as told by your health care provider.  If you have sleep apnea, surgery and certain medicines can increase your risk for breathing problems. Follow instructions from your health care provider about wearing your sleep device: ? Anytime you are sleeping, including during daytime naps. ? While taking prescription pain medicines, sleeping medicines, or medicines that make you drowsy.  If you smoke, do not smoke without supervision.  Keep all follow-up visits as told by your health care provider. This is important. Contact a health care provider if:  You keep feeling nauseous or you keep vomiting.  You feel light-headed.  You develop  a rash.  You have a fever. Get help right away if:  You have trouble breathing. Summary  For several hours after your procedure, you may feel sleepy and have poor judgment.  Have a responsible adult stay with you for at least 24 hours or until you are awake and alert. This information is not intended to replace advice given to you by your health care provider. Make sure you discuss any questions you have with your health care  provider. Document Released: 11/16/2015 Document Revised: 03/11/2017 Document Reviewed: 11/16/2015 Elsevier Interactive Patient Education  2019 [employer].

## 2018-09-28 ENCOUNTER — Ambulatory Visit (HOSPITAL_COMMUNITY)
Admission: RE | Admit: 2018-09-28 | Discharge: 2018-09-28 | Disposition: A | Discharge status: Home / Self Care | Payer: Medicare Other | Source: Ambulatory Visit | Attending: Nurse Practitioner | Admitting: Nurse Practitioner

## 2018-09-28 DIAGNOSIS — Z9049 Acquired absence of other specified parts of digestive tract: Secondary | ICD-10-CM | POA: Diagnosis not present

## 2018-09-28 DIAGNOSIS — I700 Atherosclerosis of aorta: Secondary | ICD-10-CM | POA: Insufficient documentation

## 2018-09-28 DIAGNOSIS — Z932 Ileostomy status: Secondary | ICD-10-CM | POA: Diagnosis not present

## 2018-09-28 DIAGNOSIS — J322 Chronic ethmoidal sinusitis: Secondary | ICD-10-CM | POA: Diagnosis not present

## 2018-09-28 DIAGNOSIS — C189 Malignant neoplasm of colon, unspecified: Secondary | ICD-10-CM | POA: Diagnosis not present

## 2018-09-28 DIAGNOSIS — I2510 Atherosclerotic heart disease of native coronary artery without angina pectoris: Secondary | ICD-10-CM | POA: Insufficient documentation

## 2018-09-28 DIAGNOSIS — C184 Malignant neoplasm of transverse colon: Secondary | ICD-10-CM | POA: Insufficient documentation

## 2018-09-28 LAB — GLUCOSE, CAPILLARY: Glucose-Capillary: 137 mg/dL — ABNORMAL HIGH (ref 70–99)

## 2018-09-29 ENCOUNTER — Encounter (HOSPITAL_COMMUNITY): Payer: Self-pay | Admitting: Hematology

## 2018-09-29 ENCOUNTER — Inpatient Hospital Stay (HOSPITAL_BASED_OUTPATIENT_CLINIC_OR_DEPARTMENT_OTHER): Payer: Medicare Other | Admitting: Hematology

## 2018-09-29 ENCOUNTER — Encounter (HOSPITAL_COMMUNITY): Payer: Self-pay

## 2018-09-29 VITALS — BP 141/65 | HR 67 | Temp 97.6°F | Resp 18 | Wt 183.0 lb

## 2018-09-29 DIAGNOSIS — E1142 Type 2 diabetes mellitus with diabetic polyneuropathy: Secondary | ICD-10-CM | POA: Diagnosis not present

## 2018-09-29 DIAGNOSIS — Z9221 Personal history of antineoplastic chemotherapy: Secondary | ICD-10-CM

## 2018-09-29 DIAGNOSIS — C184 Malignant neoplasm of transverse colon: Secondary | ICD-10-CM | POA: Diagnosis not present

## 2018-09-29 DIAGNOSIS — Z933 Colostomy status: Secondary | ICD-10-CM

## 2018-09-29 DIAGNOSIS — Z923 Personal history of irradiation: Secondary | ICD-10-CM | POA: Diagnosis not present

## 2018-09-29 DIAGNOSIS — Z79899 Other long term (current) drug therapy: Secondary | ICD-10-CM

## 2018-09-29 DIAGNOSIS — Z85038 Personal history of other malignant neoplasm of large intestine: Secondary | ICD-10-CM | POA: Diagnosis not present

## 2018-09-29 NOTE — Progress Notes (Signed)
START ON PATHWAY REGIMEN - Colorectal     A cycle is every 14 days:     Leucovorin      5-Fluorouracil      5-Fluorouracil   **Always confirm dose/schedule in your pharmacy ordering system**  Patient Characteristics: Postoperative without Neoadjuvant Therapy (Pathologic Staging), Colon, Stage IIB/C Therapeutic Status: Postoperative without Neoadjuvant Therapy (Pathologic Staging) Tumor Location: Colon AJCC M Category: cM0 AJCC T Category: pT4a AJCC N Category: pN0 AJCC 8 Stage Grouping: IIB Intent of Therapy: Curative Intent, Discussed with Patient

## 2018-09-29 NOTE — Progress Notes (Signed)
Oncology navigator note: I met with patient during follow up visit with Dr. Katragadda. Patient is going to begin chemotherapy and will be on the same regimen as he was previously.  Patient was given written information on leucovorin and 5-fu.  Dr. Katragadda had explained to the patient the side effects in detail. I gave patient opportunity to ask questions and his family as well.  Questions were answered to their satisfaction.  I gave them my number and contact information and asked that should they have any questions or concerns to not hesitate to give me a call.  I had him sign chemotherapy consent today.  

## 2018-09-29 NOTE — Patient Instructions (Signed)
[facility] at [facility] Discharge Instructions     Thank you for choosing [facility] at [facility] to provide your oncology and hematology care.  To afford each patient quality time with our provider, please arrive at least 15 minutes before your scheduled appointment time.   If you have a lab appointment with the Cancer Center please come in thru the  Main Entrance and check in at the main information desk  You need to re-schedule your appointment should you arrive 10 or more minutes late.  We strive to give you quality time with our providers, and arriving late affects you and other patients whose appointments are after yours.  Also, if you no show three or more times for appointments you may be dismissed from the clinic at the providers discretion.     Again, thank you for choosing [facility].  Our hope is that these requests will decrease the amount of time that you wait before being seen by our physicians.       _____________________________________________________________  Should you have questions after your visit to [facility], please contact our office at [phone] between the hours of 8:00 a.m. and 4:30 p.m.  Voicemails left after 4:00 p.m. will not be returned until the following business day.  For prescription refill requests, have your pharmacy contact our office and allow 72 hours.    Cancer Center Support Programs:   > Cancer Support Group  2nd Tuesday of the month 1pm-2pm, Journey Room    

## 2018-09-29 NOTE — Progress Notes (Signed)
[address] [place], [place] [address]   CLINIC:  Medical Oncology/Hematology  PCP:  Glenda Chroman, MD [place]  [phone]   REASON FOR VISIT: Follow-up for adenocarcinoma of the colon   CURRENT THERAPY: Infusional 5-FU/leucovorin every 2 weeks   INTERVAL HISTORY:  Mr. Parson 78 y.o. male returns for routine follow-up for adenocarcinoma of the colon. He is here today with his family. He has mild numbness in his feet from previous treatments. He reports he has had diarrhea for a while and uses imodium 4-6 times a day to help slow it down. Some days it is better than others. He has an appointment to have his port placed on Wednesday of next week and we will start treatment the follow ing week. He is overall feeling good and ready to start treatment. Denies any nausea, vomiting, or diarrhea. Denies any new pains. Had not noticed any recent bleeding such as epistaxis, hematuria or hematochezia. Denies recent chest pain on exertion, shortness of breath on minimal exertion, pre-syncopal episodes, or palpitations. Denies any recent fevers, infections, or recent hospitalizations. Patient reports appetite at 100% and energy level at 100%.    REVIEW OF SYSTEMS:  Review of Systems  Gastrointestinal: Positive for diarrhea.  Neurological: Positive for numbness.  All other systems reviewed and are negative.    PAST MEDICAL/SURGICAL HISTORY:  Past Medical History:  Diagnosis Date  . Anemia    Causing interruption in anticoagulation  . Atrial fibrillation and flutter ([place])   . BPH (benign prostatic hypertrophy)   . Colon cancer ([place])    Status post LAR 2008  . Hypothyroidism   . Lacunar infarction ([place]) 11/2012   RT 3rd NERVE PALSY, SB [name]  . PE (pulmonary embolism)    Temporarily on Eliquis 2017  . Skin cancer    35 years ago  . Type 2 diabetes mellitus ([place])    Past Surgical History:  Procedure Laterality Date  .  ABDOMINOPERINEAL PROCTOCOLECTOMY  2009   end colostomy   . BIOPSY  08/25/2018   Procedure: BIOPSY;  Surgeon: Rogene Houston, MD;  Location: [facility] ENDO SUITE;  Service: Endoscopy;;  . BLADDER REPAIR  08/29/2018   Procedure: BLADDER REPAIR;  Surgeon: [name], MD;  Location: [facility] ORS;  Service: General;;  . COLECTOMY  2008  . COLONOSCOPY WITH PROPOFOL N/A 08/25/2018   Procedure: COLONOSCOPY WITH PROPOFOL;  Surgeon: Rogene Houston, MD;  Location: [facility] ENDO SUITE;  Service: Endoscopy;  Laterality: N/A;  2:20  . COLOSTOMY    . EXPLORATORY LAPAROTOMY  2017   ? pneumoperitoneum of unknown etiology/ negative Ex lap  . ILEOSTOMY  08/29/2018   Procedure: ILEOSTOMY;  Surgeon: [name], MD;  Location: [facility] ORS;  Service: General;;  . LAPAROSCOPIC CHOLECYSTECTOMY  2007  . LYSIS OF ADHESION  08/29/2018   Procedure: LYSIS OF ADHESION;  Surgeon: [name], MD;  Location: [facility] ORS;  Service: General;;  . PARTIAL COLECTOMY N/A 08/29/2018   Procedure: PARTIAL COLECTOMY;  Surgeon: [name], MD;  Location: [facility] ORS;  Service: General;  Laterality: N/A;  . SBO SURGERY  06/2009     SOCIAL HISTORY:  Social History   Socioeconomic History  . Marital status: Married    Spouse name: Not on file  . Number of children: Not on file  . Years of education: Not on file  . Highest education level: Not on file  Occupational History  .  Occupation: Architect    Comment: [employer]  Social Needs  . Financial resource strain: Not hard at all  . Food insecurity:    Worry: Never true    Inability: Never true  . Transportation needs:    Medical: No    Non-medical: No  Tobacco Use  . Smoking status: Former Smoker    Packs/day: 1.00    Years: 27.00    Pack years: 27.00    Types: Cigarettes    Start date: 01/27/1964    Last attempt to quit: 01/27/1991    Years since quitting: 27.6  . Smokeless tobacco: Former Systems developer    Types: [place] date: 08/10/1995  Substance and Sexual Activity    . Alcohol use: Never    Alcohol/week: 0.0 standard drinks    Frequency: Never  . Drug use: Never  . Sexual activity: Not Currently  Lifestyle  . Physical activity:    Days per week: 0 days    Minutes per session: 0 min  . Stress: Not at all  Relationships  . Social connections:    Talks on phone: Twice a week    Gets together: Once a week    Attends religious service: Not on file    Active member of club or organization: Not on file    Attends meetings of clubs or organizations: Not on file    Relationship status: Not on file  . Intimate partner violence:    Fear of current or ex partner: No    Emotionally abused: No    Physically abused: No    Forced sexual activity: No  Other Topics Concern  . Not on file  Social History Narrative  . Not on file    FAMILY HISTORY:  Family History  Problem Relation Age of Onset  . Heart disease Father   . Heart failure Father   . Heart attack Father   . Prostate cancer Paternal Uncle     CURRENT MEDICATIONS:  Outpatient Encounter Medications as of 09/29/2018  Medication Sig Note  . aspirin 81 MG tablet Take 81 mg by mouth daily.   . cetirizine (ZYRTEC) 10 MG tablet Take 10 mg by mouth daily.   . ciprofloxacin (CIPRO) 250 MG tablet Take 250 mg by mouth 2 (two) times daily.   . ferrous sulfate (FERROUSUL) 325 (65 FE) MG tablet Take 1 tablet (325 mg total) by mouth 2 (two) times daily with a meal.   . glipiZIDE (GLUCOTROL) 10 MG tablet Take 10 mg by mouth 2 (two) times daily.   Marland Kitchen levothyroxine (SYNTHROID, LEVOTHROID) 50 MCG tablet Take 50 mcg by mouth daily before breakfast.   . loperamide (IMODIUM) 2 MG capsule Take 1 capsule (2 mg total) by mouth 4 (four) times daily -  before meals and at bedtime.   . metoprolol tartrate (LOPRESSOR) 25 MG tablet Take 1 tablet (25 mg total) by mouth 2 (two) times daily.   . psyllium (HYDROCIL/METAMUCIL) 95 % PACK Take 1 packet by mouth daily.   Marland Kitchen sulfamethoxazole-trimethoprim (BACTRIM DS,SEPTRA DS)  800-160 MG tablet Take 1 tablet by mouth 2 (two) times daily. 09/22/2018: Should be finished on 09/23/2018  . tamsulosin (FLOMAX) 0.4 MG CAPS capsule Take 0.4 mg by mouth at bedtime.    . vitamin C (ASCORBIC ACID) 500 MG tablet Take 1 tablet (500 mg total) by mouth daily.    No facility-administered encounter medications on file as of 09/29/2018.     ALLERGIES:  No Known Allergies  PHYSICAL EXAM:  ECOG Performance status: 1  Vitals:   09/29/18 0948  BP: (!) 141/65  Pulse: 67  Resp: 18  Temp: 97.6 F (36.4 C)  SpO2: 100%   Filed Weights   09/29/18 0948  Weight: 183 lb (83 kg)    Physical Exam Constitutional:      Appearance: Normal appearance. He is normal weight.  Musculoskeletal: Normal range of motion.  Skin:    General: Skin is warm and dry.  Neurological:     Mental Status: He is alert and oriented to person, place, and time. Mental status is at baseline.  Psychiatric:        Mood and Affect: Mood normal.        Behavior: Behavior normal.        Thought Content: Thought content normal.        Judgment: Judgment normal.      LABORATORY DATA:  I have reviewed the labs as listed.  CBC    Component Value Date/Time   WBC 8.7 09/14/2018 1514   WBC 7.4 09/04/2018 0409   RBC 4.44 09/14/2018 1514   RBC 3.72 (L) 09/04/2018 0409   HGB 11.8 (L) 09/14/2018 1514   HCT 38.4 09/14/2018 1514   PLT 468 (H) 09/14/2018 1514   MCV 87 09/14/2018 1514   MCH 26.6 09/14/2018 1514   MCH 27.4 09/04/2018 0409   MCHC 30.7 (L) 09/14/2018 1514   MCHC 30.9 09/04/2018 0409   RDW 14.3 09/14/2018 1514   LYMPHSABS 1.8 09/14/2018 1514   MONOABS 0.9 09/04/2018 0409   EOSABS 0.6 (H) 09/14/2018 1514   BASOSABS 0.1 09/14/2018 1514   CMP Latest Ref Rng & Units 09/14/2018 09/04/2018 09/03/2018  Glucose 65 - 99 mg/dL 301(H) 153(H) 136(H)  BUN 8 - 27 mg/dL 7(L) 9 10  Creatinine 0.76 - 1.27 mg/dL 1.00 0.99 0.83  Sodium 134 - 144 mmol/L 135 137 135  Potassium 3.5 - 5.2 mmol/L 5.3(H) 5.0  3.4(L)  Chloride 96 - 106 mmol/L 98 104 102  CO2 20 - 29 mmol/L '24 27 25  '$ Calcium 8.6 - 10.2 mg/dL 9.2 8.4(L) 7.9(L)  Total Protein 6.5 - 8.1 g/dL - 5.5(L) 5.3(L)  Total Bilirubin 0.3 - 1.2 mg/dL - 0.8 0.9  Alkaline Phos 38 - 126 U/L - 87 87  AST 15 - 41 U/L - 19 22  ALT 0 - 44 U/L - 12 13       DIAGNOSTIC IMAGING:  I have independently reviewed the scans and discussed with the patient.   I have reviewed Francene Finders, NP's note and agree with the documentation.  I personally performed a face-to-face visit, made revisions and my assessment and plan is as follows.    ASSESSMENT & PLAN:   Cancer of transverse colon ([place]) 1.  Stage IIb (T4AN0) transverse colon adenocarcinoma, MSI-stable: - He had a history of colon cancer in June 2008, status post resection and did not require any adjuvant therapy. - He had a recurrence in February 2009, underwent resection with colostomy.  This was followed by 6 months of FOLFOX based chemotherapy sandwiching radiation. - He noticed blood in the colostomy bag.  He underwent colonoscopy on 08/25/2018 which showed malignant partially obstructing tumor in the mid transverse colon with active bleeding.  Other parts of the colon was normal.  Biopsy showed adenocarcinoma.  - Completion colectomy with end ileostomy, lysis of adhesions and bladder repair on 08/29/2018 by Dr. Constance Haw. - Pathology showed pT4a, PN0, 0/16 lymph nodes positive, MMR normal.  Margins were negative. -PET/CT scan on 09/28/2018 shows no findings of active malignancy.  There is some mild activity associated with left upper quadrant ascites and anterior abdominal wall wound sites, likely inflammatory but potentially meriting surveillance.  Liver appears unremarkable. -CEA level was 2.0.  He is planning to have a port placed by Dr. Constance Haw on 10/04/2018. - Because of his advanced age and the pre-existing neuropathy, I have recommended 6 months of adjuvant chemotherapy without  oxaliplatin. -Because of co-pay issues, we have decided to pursue infusional 5-FU and leucovorin.  We discussed the side effects of the chemotherapy regimen including but not limited to mucositis, hand-foot skin reaction, diarrhea and nausea.  He reports that he is emptying the bag about 3-4 times since the end ileostomy.  He is also taking 4-5 Imodium tablets daily since the surgery. - We will plan to start him on 10/09/2018 with his adjuvant chemotherapy.  2.  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  -This has improved over time.  He has on and off numbness in the fingertips and toes.  He has trouble buttoning his shirts. -He was treated with gabapentin in the past.    Total time spent is 40 minutes with more than 50% of the time spent face-to-face discussing scan results, chemotherapy options, side effects and coordination of care.    Orders placed this encounter:  Orders Placed This Encounter  Procedures  . Magnesium  . CBC with Differential/Platelet  . Comprehensive metabolic panel  . Lactate dehydrogenase  . CEA      Derek Jack, MD [place] [phone]

## 2018-09-29 NOTE — Assessment & Plan Note (Signed)
1.  Stage IIb (T4AN0) transverse colon adenocarcinoma, MSI-stable: - He had a history of colon cancer in June 2008, status post resection and did not require any adjuvant therapy. - He had a recurrence in February 2009, underwent resection with colostomy.  This was followed by 6 months of FOLFOX based chemotherapy sandwiching radiation. - He noticed blood in the colostomy bag.  He underwent colonoscopy on 08/25/2018 which showed malignant partially obstructing tumor in the mid transverse colon with active bleeding.  Other parts of the colon was normal.  Biopsy showed adenocarcinoma.  - Completion colectomy with end ileostomy, lysis of adhesions and bladder repair on 08/29/2018 by Dr. Constance Haw. - Pathology showed pT4a, PN0, 0/16 lymph nodes positive, MMR normal.  Margins were negative. -PET/CT scan on 09/28/2018 shows no findings of active malignancy.  There is some mild activity associated with left upper quadrant ascites and anterior abdominal wall wound sites, likely inflammatory but potentially meriting surveillance.  Liver appears unremarkable. -CEA level was 2.0.  He is planning to have a port placed by Dr. Constance Haw on 10/04/2018. - Because of his advanced age and the pre-existing neuropathy, I have recommended 6 months of adjuvant chemotherapy without oxaliplatin. -Because of co-pay issues, we have decided to pursue infusional 5-FU and leucovorin.  We discussed the side effects of the chemotherapy regimen including but not limited to mucositis, hand-foot skin reaction, diarrhea and nausea.  He reports that he is emptying the bag about 3-4 times since the end ileostomy.  He is also taking 4-5 Imodium tablets daily since the surgery. - We will plan to start him on 10/09/2018 with his adjuvant chemotherapy.  2.  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  -This has improved over time.  He has on and off numbness in the fingertips and toes.  He  has trouble buttoning his shirts. -He was treated with gabapentin in the past.

## 2018-09-29 NOTE — Pre-Procedure Instructions (Signed)
Dr Kathrin Ruddy aware of potassium 5.3. Will repeat potassium on arrival 10/03/2018.

## 2018-10-02 ENCOUNTER — Encounter (HOSPITAL_COMMUNITY)
Admission: RE | Admit: 2018-10-02 | Discharge: 2018-10-02 | Disposition: A | Discharge status: Home / Self Care | Payer: Medicare Other | Source: Ambulatory Visit | Attending: General Surgery | Admitting: General Surgery

## 2018-10-04 ENCOUNTER — Encounter (HOSPITAL_COMMUNITY)
Admission: RE | Disposition: A | Discharge status: Home / Self Care | Payer: Self-pay | Source: Home / Self Care | Attending: General Surgery

## 2018-10-04 ENCOUNTER — Ambulatory Visit (HOSPITAL_COMMUNITY): Payer: Medicare Other

## 2018-10-04 ENCOUNTER — Encounter (HOSPITAL_COMMUNITY): Payer: Self-pay | Admitting: *Deleted

## 2018-10-04 ENCOUNTER — Ambulatory Visit (HOSPITAL_COMMUNITY): Payer: Medicare Other | Admitting: Anesthesiology

## 2018-10-04 ENCOUNTER — Ambulatory Visit (HOSPITAL_COMMUNITY)
Admission: RE | Admit: 2018-10-04 | Discharge: 2018-10-04 | Disposition: A | Discharge status: Home / Self Care | Payer: Medicare Other | Attending: General Surgery | Admitting: General Surgery

## 2018-10-04 DIAGNOSIS — I10 Essential (primary) hypertension: Secondary | ICD-10-CM | POA: Diagnosis not present

## 2018-10-04 DIAGNOSIS — Z85038 Personal history of other malignant neoplasm of large intestine: Secondary | ICD-10-CM | POA: Insufficient documentation

## 2018-10-04 DIAGNOSIS — Z8673 Personal history of transient ischemic attack (TIA), and cerebral infarction without residual deficits: Secondary | ICD-10-CM | POA: Insufficient documentation

## 2018-10-04 DIAGNOSIS — C184 Malignant neoplasm of transverse colon: Secondary | ICD-10-CM | POA: Diagnosis present

## 2018-10-04 DIAGNOSIS — I4892 Unspecified atrial flutter: Secondary | ICD-10-CM | POA: Diagnosis not present

## 2018-10-04 DIAGNOSIS — I4891 Unspecified atrial fibrillation: Secondary | ICD-10-CM | POA: Diagnosis not present

## 2018-10-04 DIAGNOSIS — Z8249 Family history of ischemic heart disease and other diseases of the circulatory system: Secondary | ICD-10-CM | POA: Insufficient documentation

## 2018-10-04 DIAGNOSIS — C189 Malignant neoplasm of colon, unspecified: Secondary | ICD-10-CM | POA: Insufficient documentation

## 2018-10-04 DIAGNOSIS — Z87891 Personal history of nicotine dependence: Secondary | ICD-10-CM | POA: Insufficient documentation

## 2018-10-04 DIAGNOSIS — Z8042 Family history of malignant neoplasm of prostate: Secondary | ICD-10-CM | POA: Diagnosis not present

## 2018-10-04 DIAGNOSIS — E039 Hypothyroidism, unspecified: Secondary | ICD-10-CM | POA: Diagnosis not present

## 2018-10-04 DIAGNOSIS — Z7982 Long term (current) use of aspirin: Secondary | ICD-10-CM | POA: Diagnosis not present

## 2018-10-04 DIAGNOSIS — Z9049 Acquired absence of other specified parts of digestive tract: Secondary | ICD-10-CM | POA: Insufficient documentation

## 2018-10-04 DIAGNOSIS — I499 Cardiac arrhythmia, unspecified: Secondary | ICD-10-CM | POA: Diagnosis not present

## 2018-10-04 DIAGNOSIS — Z86711 Personal history of pulmonary embolism: Secondary | ICD-10-CM | POA: Insufficient documentation

## 2018-10-04 DIAGNOSIS — Z79899 Other long term (current) drug therapy: Secondary | ICD-10-CM | POA: Insufficient documentation

## 2018-10-04 DIAGNOSIS — Z95828 Presence of other vascular implants and grafts: Secondary | ICD-10-CM

## 2018-10-04 DIAGNOSIS — Z7984 Long term (current) use of oral hypoglycemic drugs: Secondary | ICD-10-CM | POA: Insufficient documentation

## 2018-10-04 DIAGNOSIS — E119 Type 2 diabetes mellitus without complications: Secondary | ICD-10-CM | POA: Insufficient documentation

## 2018-10-04 DIAGNOSIS — N400 Enlarged prostate without lower urinary tract symptoms: Secondary | ICD-10-CM | POA: Diagnosis not present

## 2018-10-04 DIAGNOSIS — Z85828 Personal history of other malignant neoplasm of skin: Secondary | ICD-10-CM | POA: Diagnosis not present

## 2018-10-04 DIAGNOSIS — Z452 Encounter for adjustment and management of vascular access device: Secondary | ICD-10-CM | POA: Diagnosis not present

## 2018-10-04 HISTORY — PX: PORTACATH PLACEMENT: SHX2246

## 2018-10-04 LAB — GLUCOSE, CAPILLARY: Glucose-Capillary: 95 mg/dL (ref 70–99)

## 2018-10-04 SURGERY — INSERTION, TUNNELED CENTRAL VENOUS DEVICE, WITH PORT
Anesthesia: Monitor Anesthesia Care | Site: Chest | Laterality: Left

## 2018-10-04 MED ORDER — LIDOCAINE HCL (CARDIAC) PF 100 MG/5ML IV SOSY
PREFILLED_SYRINGE | INTRAVENOUS | Status: DC | PRN
  Administered 2018-10-04: 40 mg via INTRAVENOUS

## 2018-10-04 MED ORDER — CHLORHEXIDINE GLUCONATE CLOTH 2 % EX PADS: 6.0000 | MEDICATED_PAD | Freq: Once | CUTANEOUS | Status: DC

## 2018-10-04 MED ORDER — LIDOCAINE HCL (PF) 1 % IJ SOLN
INTRAMUSCULAR | Status: DC | PRN
  Administered 2018-10-04: 12 mL

## 2018-10-04 MED ORDER — MEPERIDINE HCL 50 MG/ML IJ SOLN: 6.2500 mg | INTRAMUSCULAR | Status: DC | PRN

## 2018-10-04 MED ORDER — ONDANSETRON HCL 4 MG/2ML IJ SOLN: 4.0000 mg | Freq: Once | INTRAMUSCULAR | Status: DC | PRN

## 2018-10-04 MED ORDER — LIDOCAINE HCL (PF) 1 % IJ SOLN
INTRAMUSCULAR | Status: AC
  Filled 2018-10-04: qty 30

## 2018-10-04 MED ORDER — KETAMINE HCL 50 MG/5ML IJ SOSY
PREFILLED_SYRINGE | INTRAMUSCULAR | Status: AC
  Filled 2018-10-04: qty 5

## 2018-10-04 MED ORDER — LACTATED RINGERS IV SOLN
INTRAVENOUS | Status: DC
  Administered 2018-10-04: 12:00:00 via INTRAVENOUS
  Administered 2018-10-04: 1000 mL via INTRAVENOUS

## 2018-10-04 MED ORDER — LIDOCAINE 2% (20 MG/ML) 5 ML SYRINGE
INTRAMUSCULAR | Status: AC
  Filled 2018-10-04: qty 5

## 2018-10-04 MED ORDER — PROPOFOL 10 MG/ML IV BOLUS
INTRAVENOUS | Status: AC
  Filled 2018-10-04: qty 40

## 2018-10-04 MED ORDER — GLYCOPYRROLATE 0.2 MG/ML IJ SOLN
INTRAMUSCULAR | Status: DC | PRN
  Administered 2018-10-04: 0.2 mg via INTRAVENOUS

## 2018-10-04 MED ORDER — KETAMINE HCL 10 MG/ML IJ SOLN
INTRAMUSCULAR | Status: DC | PRN
  Administered 2018-10-04 (×2): 10 mg via INTRAVENOUS

## 2018-10-04 MED ORDER — PROPOFOL 500 MG/50ML IV EMUL
INTRAVENOUS | Status: DC | PRN
  Administered 2018-10-04: 12:00:00 via INTRAVENOUS
  Administered 2018-10-04: 75 ug/kg/min via INTRAVENOUS

## 2018-10-04 MED ORDER — PROPOFOL 10 MG/ML IV BOLUS
INTRAVENOUS | Status: DC | PRN
  Administered 2018-10-04: 20 mg via INTRAVENOUS

## 2018-10-04 MED ORDER — HEPARIN SOD (PORK) LOCK FLUSH 100 UNIT/ML IV SOLN
INTRAVENOUS | Status: AC
  Filled 2018-10-04: qty 5

## 2018-10-04 MED ORDER — HEPARIN SOD (PORK) LOCK FLUSH 100 UNIT/ML IV SOLN
INTRAVENOUS | Status: DC | PRN
  Administered 2018-10-04: 500 [IU] via INTRAVENOUS

## 2018-10-04 MED ORDER — GLYCOPYRROLATE PF 0.2 MG/ML IJ SOSY
PREFILLED_SYRINGE | INTRAMUSCULAR | Status: AC
  Filled 2018-10-04: qty 1

## 2018-10-04 MED ORDER — CEFAZOLIN SODIUM-DEXTROSE 2-4 GM/100ML-% IV SOLN
2.0000 g | INTRAVENOUS | Status: AC
  Administered 2018-10-04: 2 g via INTRAVENOUS
  Filled 2018-10-04: qty 100

## 2018-10-04 MED ORDER — SODIUM CHLORIDE FLUSH 0.9 % IV SOLN
INTRAVENOUS | Status: DC | PRN
  Administered 2018-10-04: 500 mL via INTRAVENOUS

## 2018-10-04 MED ORDER — HYDROCODONE-ACETAMINOPHEN 7.5-325 MG PO TABS: 1.0000 | ORAL_TABLET | Freq: Once | ORAL | Status: DC | PRN

## 2018-10-04 MED ORDER — KETOROLAC TROMETHAMINE 30 MG/ML IJ SOLN: 30.0000 mg | Freq: Once | INTRAMUSCULAR | Status: DC | PRN

## 2018-10-04 MED ORDER — IOTHALAMATE MEGLUMINE 17.2 % UR SOLN: 250.0000 mL | Freq: Once | URETHRAL | Status: DC | PRN

## 2018-10-04 MED ORDER — HYDROMORPHONE HCL 1 MG/ML IJ SOLN: 0.2500 mg | INTRAMUSCULAR | Status: DC | PRN

## 2018-10-04 SURGICAL SUPPLY — 34 items
BAG DECANTER FOR FLEXI CONT (MISCELLANEOUS) ×3 IMPLANT
CHLORAPREP W/TINT 10.5 ML (MISCELLANEOUS) ×3 IMPLANT
CLOTH BEACON ORANGE TIMEOUT ST (SAFETY) ×3 IMPLANT
COVER LIGHT HANDLE STERIS (MISCELLANEOUS) ×6 IMPLANT
COVER WAND RF STERILE (DRAPES) ×3 IMPLANT
DECANTER SPIKE VIAL GLASS SM (MISCELLANEOUS) ×3 IMPLANT
DERMABOND ADVANCED (GAUZE/BANDAGES/DRESSINGS) ×2
DERMABOND ADVANCED .7 DNX12 (GAUZE/BANDAGES/DRESSINGS) ×1 IMPLANT
DRAPE C-ARM FOLDED MOBILE STRL (DRAPES) ×3 IMPLANT
ELECT REM PT RETURN 9FT ADLT (ELECTROSURGICAL) ×3
ELECTRODE REM PT RTRN 9FT ADLT (ELECTROSURGICAL) ×1 IMPLANT
GLOVE BIO SURGEON STRL SZ 6.5 (GLOVE) ×2 IMPLANT
GLOVE BIO SURGEONS STRL SZ 6.5 (GLOVE) ×1
GLOVE BIOGEL PI IND STRL 6.5 (GLOVE) ×2 IMPLANT
GLOVE BIOGEL PI IND STRL 7.0 (GLOVE) ×1 IMPLANT
GLOVE BIOGEL PI INDICATOR 6.5 (GLOVE) ×4
GLOVE BIOGEL PI INDICATOR 7.0 (GLOVE) ×2
GLOVE ECLIPSE 6.5 STRL STRAW (GLOVE) ×6 IMPLANT
GOWN STRL REUS W/TWL LRG LVL3 (GOWN DISPOSABLE) ×6 IMPLANT
IV NS 500ML (IV SOLUTION) ×2
IV NS 500ML BAXH (IV SOLUTION) ×1 IMPLANT
KIT PORT POWER 8FR ISP MRI (Port) ×3 IMPLANT
KIT TURNOVER KIT A (KITS) ×3 IMPLANT
MANIFOLD NEPTUNE II (INSTRUMENTS) ×3 IMPLANT
NEEDLE HYPO 25X1 1.5 SAFETY (NEEDLE) ×3 IMPLANT
PACK MINOR (CUSTOM PROCEDURE TRAY) ×3 IMPLANT
PAD ARMBOARD 7.5X6 YLW CONV (MISCELLANEOUS) ×3 IMPLANT
SET BASIN LINEN APH (SET/KITS/TRAYS/PACK) ×3 IMPLANT
SUT MNCRL AB 4-0 PS2 18 (SUTURE) ×3 IMPLANT
SUT PROLENE 2 0 SH 30 (SUTURE) ×3 IMPLANT
SUT VIC AB 3-0 SH 27 (SUTURE) ×2
SUT VIC AB 3-0 SH 27X BRD (SUTURE) ×1 IMPLANT
SYR 20CC LL (SYRINGE) ×3 IMPLANT
SYR CONTROL 10ML LL (SYRINGE) ×3 IMPLANT

## 2018-10-04 NOTE — Op Note (Signed)
Operative Note 10/04/18   Preoperative Diagnosis:  Colon cancer    Postoperative Diagnosis: Same   Procedure(s) Performed: Port-A-Cath placement, left subclavian    Surgeon: Lanell Matar. Constance Haw, MD   Assistants: No qualified resident was available   Anesthesia: Monitored anesthesia care   Anesthesiologist: Nicanor Alcon, MD    Specimens: None   Estimated Blood Loss: Minimal   Blood Replacement: None    Complications: None    Operative Findings: Normal anatomy   Indications: Jose Lawrence is a 78 yo with recently diagnosed colon cancer who will need to proceed with chemotherapy after his surgery. He had a prior colon cancer and port a catheter on the right in the past.  We discussed the risk and benefits of surgery including but not limited to bleeding, infection, malfunction, pneumothorax, and need to use the right side if the left side was not able to be accessed.  He opted to proceed.   Procedure: The patient was brought into the operating room and monitored anesthesia care was induced.  One percent lidocaine was used for local anesthesia.   The left chest and neck was prepped and draped in the usual sterile fashion.  Preoperative antibiotics were given.  An incision was made below the left clavicle. A subcutaneous pocket was formed. The needles advanced into the left subclavian vein using the Seldinger technique without difficulty. A guidewire was then advanced into the right atrium under fluoroscopic guidance.  Ectopia was not noted. An introducer and peel-away sheath were placed over the guidewire. The catheter was then inserted through the peel-away sheath and the peel-away sheath was removed.  A spot film was performed to confirm the position. The catheter was then attached to the port and the port placed in subcutaneous pocket. Adequate positioning was confirmed by fluoroscopy. Hemostasis was confirmed, and the port was secured with 2-0 prolene sutures.  Good backflow of blood was  noted on aspiration of the port. The port was flushed with heparin flush. Subcutaneous layer was reapproximated using a 3-0 Vicryl interrupted suture. The skin was closed using a 4-0 Vicryl subcuticular suture. Dermabond was applied.  All tape and needle counts were correct at the end of the procedure. The patient was transferred to PACU in stable condition. A chest x-ray will be performed at that time.  Curlene Labrum, MD [facility] [address] [place], [place] [address] [phone] (office)

## 2018-10-04 NOTE — Progress Notes (Signed)
Port in good position.  Curlene Labrum, MD

## 2018-10-04 NOTE — Anesthesia Procedure Notes (Signed)
Procedure Name: MAC Date/Time: 10/04/2018 11:33 AM Performed by: Andree Elk Amy A, CRNA Pre-anesthesia Checklist: Patient identified, Emergency Drugs available, Suction available, Timeout performed and Patient being monitored Patient Re-evaluated:Patient Re-evaluated prior to induction Oxygen Delivery Method: Nasal Cannula

## 2018-10-04 NOTE — Anesthesia Postprocedure Evaluation (Signed)
Anesthesia Post Note  Patient: Jose Lawrence  Procedure(s) Performed: INSERTION PORT-A-CATH (attached catheter left subclavian)  Patient location during evaluation: PACU Anesthesia Type: MAC Level of consciousness: awake and alert and oriented Pain management: pain level controlled Vital Signs Assessment: post-procedure vital signs reviewed and stable Respiratory status: spontaneous breathing Cardiovascular status: stable Postop Assessment: no apparent nausea or vomiting Anesthetic complications: no     Last Vitals:  Vitals:   10/04/18 0941  BP: 138/74  Pulse: 71  Resp: 16  Temp: 36.7 C  SpO2: 99%    Last Pain:  Vitals:   10/04/18 0941  TempSrc: Oral  PainSc: 0-No pain                 Swetha Rayle A

## 2018-10-04 NOTE — Discharge Instructions (Signed)
Keep area clean and dry. You can take a shower in 24 hours. Do not submerge in water.  Take tylenol and ibuprofen for pain control and take narcotic you have at home with severe pain.    Implanted Port Insertion, Care After This sheet gives you information about how to care for yourself after your procedure. Your health care provider may also give you more specific instructions. If you have problems or questions, contact your health care provider. What can I expect after the procedure? After the procedure, it is common to have:  Discomfort at the port insertion site.  Bruising on the skin over the port. This should improve over 3-4 days. Follow these instructions at home: [facility] care  After your port is placed, you will get a manufacturer's information card. The card has information about your port. Keep this card with you at all times.  Take care of the port as told by your health care provider. Ask your health care provider if you or a family member can get training for taking care of the port at home. A home health care nurse may also take care of the port.  Make sure to remember what type of port you have. Incision care      Follow instructions from your health care provider about how to take care of your port insertion site. Make sure you: ? Wash your hands with soap and water before and after you change your bandage (dressing). If soap and water are not available, use hand sanitizer. ? Change your dressing as told by your health care provider. ? Leave stitches (sutures), skin glue, or adhesive strips in place. These skin closures may need to stay in place for 2 weeks or longer. If adhesive strip edges start to loosen and curl up, you may trim the loose edges. Do not remove adhesive strips completely unless your health care provider tells you to do that.  Check your port insertion site every day for signs of infection. Check for: ? Redness, swelling, or pain. ? Fluid or  blood. ? Warmth. ? Pus or a bad smell. Activity  Return to your normal activities as told by your health care provider. Ask your health care provider what activities are safe for you.  Do not lift anything that is heavier than 10 lb (4.5 kg), or the limit that you are told, until your health care provider says that it is safe. General instructions  Take over-the-counter and prescription medicines only as told by your health care provider.  Do not take baths, swim, or use a hot tub until your health care provider approves.   You may shower.  Do not drive for 24 hours if you were given a sedative during your procedure.  Wear a medical alert bracelet in case of an emergency. This will tell any health care providers that you have a port.  Keep all follow-up visits as told by your health care provider. This is important. Contact a health care provider if:  You cannot flush your port with saline as directed, or you cannot draw blood from the port.  You have a fever or chills.  You have redness, swelling, or pain around your port insertion site.  You have fluid or blood coming from your port insertion site.  Your port insertion site feels warm to the touch.  You have pus or a bad smell coming from the port insertion site. Get help right away if:  You have chest pain or shortness  of breath.  You have bleeding from your port that you cannot control. Summary  Take care of the port as told by your health care provider. Keep the manufacturer's information card with you at all times.  Change your dressing as told by your health care provider.  Contact a health care provider if you have a fever or chills or if you have redness, swelling, or pain around your port insertion site.  Keep all follow-up visits as told by your health care provider. This information is not intended to replace advice given to you by your health care provider. Make sure you discuss any questions you have with  your health care provider. Document Released: 05/16/2013 Document Revised: 02/21/2018 Document Reviewed: 02/21/2018 Elsevier Interactive Patient Education  2019 [place] POST-ANESTHESIA  IMMEDIATELY FOLLOWING SURGERY:  Do not drive or operate machinery for the first twenty four hours after surgery.  Do not make any important decisions for twenty four hours after surgery or while taking narcotic pain medications or sedatives.  If you develop intractable nausea and vomiting or a severe headache please notify your doctor immediately.  FOLLOW-UP:  Please make an appointment with your surgeon as instructed. You do not need to follow up with anesthesia unless specifically instructed to do so.  WOUND CARE INSTRUCTIONS (if applicable):  Keep a dry clean dressing on the anesthesia/puncture wound site if there is drainage.  Once the wound has quit draining you may leave it open to air.  Generally you should leave the bandage intact for twenty four hours unless there is drainage.  If the epidural site drains for more than 36-48 hours please call the anesthesia department.  QUESTIONS?:  Please feel free to call your physician or the hospital operator if you have any questions, and they will be happy to assist you.

## 2018-10-04 NOTE — Addendum Note (Signed)
Encounter addended by: Annie Paras on: 10/04/2018 3:53 PM  Actions taken: Imaging Exam ended, Order list changed

## 2018-10-04 NOTE — Interval H&P Note (Signed)
History and Physical Interval Note:  10/04/2018 10:30 AM  Jose Lawrence  has presented today for surgery, with the diagnosis of colon cancer  The various methods of treatment have been discussed with the patient and family. After consideration of risks, benefits and other options for treatment, the patient has consented to  Procedure(s): INSERTION PORT-A-CATH (Left) as a surgical intervention .  The patient's history has been reviewed, patient examined, no change in status, stable for surgery.  I have reviewed the patient's chart and labs.  Questions were answered to the patient's satisfaction.    Prior port on right side. Right handed. Will start with left.  [name]

## 2018-10-04 NOTE — Anesthesia Preprocedure Evaluation (Signed)
Anesthesia Evaluation  Patient identified by MRN, date of birth, ID band Patient awake    Reviewed: Allergy & Precautions, H&P , NPO status , Patient's Chart, lab work & pertinent test results  Airway Mallampati: II  TM Distance: >3 FB Neck ROM: full    Dental no notable dental hx. (+) Teeth Intact   Pulmonary neg pulmonary ROS, former smoker,    Pulmonary exam normal breath sounds clear to auscultation       Cardiovascular Exercise Tolerance: Good hypertension, negative cardio ROS Normal cardiovascular exam+ dysrhythmias Atrial Fibrillation I Rhythm:regular Rate:Normal  States gardens and cuts wood -but recently decreasing ET Has been receiving prbcs x3 - last time 2 units ~2 weeks ago    Neuro/Psych Per chart h/o lacunar infarct with CN 3,5 issues  States uses an eye patch  negative neurological ROS  negative psych ROS   GI/Hepatic negative GI ROS, Neg liver ROS, Colon mass and anemia  H/o Colon Ca in the past - now diverted  New colon mass   Endo/Other  diabetes, Well Controlled, Type 2Hypothyroidism   Renal/GU negative Renal ROS  negative genitourinary   Musculoskeletal negative musculoskeletal ROS (+)   Abdominal   Peds negative pediatric ROS (+)  Hematology negative hematology ROS (+) Blood dyscrasia, anemia ,   Anesthesia Other Findings   Reproductive/Obstetrics negative OB ROS                             Anesthesia Physical  Anesthesia Plan  ASA: III  Anesthesia Plan: MAC   Post-op Pain Management:    Induction: Intravenous  PONV Risk Score and Plan:   Airway Management Planned: Nasal Cannula and Simple Face Mask  Additional Equipment:   Intra-op Plan:   Post-operative Plan:   Informed Consent: I have reviewed the patients History and Physical, chart, labs and discussed the procedure including the risks, benefits and alternatives for the proposed anesthesia  with the patient or authorized representative who has indicated his/her understanding and acceptance.     Dental Advisory Given  Plan Discussed with: CRNA  Anesthesia Plan Comments:         Anesthesia Quick Evaluation

## 2018-10-04 NOTE — Transfer of Care (Signed)
Immediate Anesthesia Transfer of Care Note  Patient: Jose Lawrence  Procedure(s) Performed: INSERTION PORT-A-CATH (attached catheter left subclavian)  Patient Location: PACU  Anesthesia Type:MAC  Level of Consciousness: awake, alert , oriented and patient cooperative  Airway & Oxygen Therapy: Patient Spontanous Breathing  Post-op Assessment: Report given to RN and Post -op Vital signs reviewed and stable  Post vital signs: Reviewed and stable  Last Vitals:  Vitals Value Taken Time  BP 116/71 10/04/2018 12:25 PM  Temp    Pulse 66 10/04/2018 12:27 PM  Resp 4 10/04/2018 12:27 PM  SpO2 99 % 10/04/2018 12:27 PM  Vitals shown include unvalidated device data.  Last Pain:  Vitals:   10/04/18 0941  TempSrc: Oral  PainSc: 0-No pain      Patients Stated Pain Goal: 8 (66/44/03 4742)  Complications: No apparent anesthesia complications

## 2018-10-05 ENCOUNTER — Inpatient Hospital Stay (HOSPITAL_COMMUNITY): Payer: Medicare Other

## 2018-10-06 ENCOUNTER — Encounter (HOSPITAL_COMMUNITY): Payer: Self-pay | Admitting: General Surgery

## 2018-10-09 ENCOUNTER — Encounter (HOSPITAL_COMMUNITY): Payer: Self-pay

## 2018-10-09 ENCOUNTER — Inpatient Hospital Stay (HOSPITAL_COMMUNITY): Payer: Medicare Other | Attending: Internal Medicine

## 2018-10-09 ENCOUNTER — Inpatient Hospital Stay (HOSPITAL_COMMUNITY): Payer: Medicare Other

## 2018-10-09 VITALS — BP 126/67 | HR 60 | Temp 97.8°F | Resp 18 | Wt 188.6 lb

## 2018-10-09 DIAGNOSIS — G629 Polyneuropathy, unspecified: Secondary | ICD-10-CM | POA: Diagnosis not present

## 2018-10-09 DIAGNOSIS — Z79899 Other long term (current) drug therapy: Secondary | ICD-10-CM | POA: Insufficient documentation

## 2018-10-09 DIAGNOSIS — C184 Malignant neoplasm of transverse colon: Secondary | ICD-10-CM | POA: Diagnosis not present

## 2018-10-09 DIAGNOSIS — Z5111 Encounter for antineoplastic chemotherapy: Secondary | ICD-10-CM | POA: Diagnosis not present

## 2018-10-09 LAB — CBC WITH DIFFERENTIAL/PLATELET
Abs Immature Granulocytes: 0.02 10*3/uL (ref 0.00–0.07)
Basophils Absolute: 0.1 10*3/uL (ref 0.0–0.1)
Basophils Relative: 1 %
Eosinophils Absolute: 0.4 10*3/uL (ref 0.0–0.5)
Eosinophils Relative: 7 %
HCT: 36.2 % — ABNORMAL LOW (ref 39.0–52.0)
Hemoglobin: 11.2 g/dL — ABNORMAL LOW (ref 13.0–17.0)
Immature Granulocytes: 0 %
Lymphocytes Relative: 22 %
Lymphs Abs: 1.1 10*3/uL (ref 0.7–4.0)
MCH: 28.4 pg (ref 26.0–34.0)
MCHC: 30.9 g/dL (ref 30.0–36.0)
MCV: 91.6 fL (ref 80.0–100.0)
MONOS PCT: 10 %
Monocytes Absolute: 0.5 10*3/uL (ref 0.1–1.0)
Neutro Abs: 2.9 10*3/uL (ref 1.7–7.7)
Neutrophils Relative %: 60 %
Platelets: 154 10*3/uL (ref 150–400)
RBC: 3.95 MIL/uL — ABNORMAL LOW (ref 4.22–5.81)
RDW: 18.2 % — ABNORMAL HIGH (ref 11.5–15.5)
WBC: 4.9 10*3/uL (ref 4.0–10.5)
nRBC: 0 % (ref 0.0–0.2)

## 2018-10-09 LAB — COMPREHENSIVE METABOLIC PANEL
ALT: 18 U/L (ref 0–44)
AST: 32 U/L (ref 15–41)
Albumin: 3.2 g/dL — ABNORMAL LOW (ref 3.5–5.0)
Alkaline Phosphatase: 120 U/L (ref 38–126)
Anion gap: 6 (ref 5–15)
BUN: 9 mg/dL (ref 8–23)
CO2: 26 mmol/L (ref 22–32)
Calcium: 9 mg/dL (ref 8.9–10.3)
Chloride: 103 mmol/L (ref 98–111)
Creatinine, Ser: 0.85 mg/dL (ref 0.61–1.24)
GFR calc Af Amer: >60 mL/min (ref >60)
Glucose, Bld: 216 mg/dL — ABNORMAL HIGH (ref 70–99)
Potassium: 4.5 mmol/L (ref 3.5–5.1)
Sodium: 135 mmol/L (ref 135–145)
Total Bilirubin: 0.4 mg/dL (ref 0.3–1.2)
Total Protein: 7.2 g/dL (ref 6.5–8.1)

## 2018-10-09 MED ORDER — SODIUM CHLORIDE 0.9 % IV SOLN
2400.0000 mg/m2 | INTRAVENOUS | Status: DC
  Administered 2018-10-09: 4900 mg via INTRAVENOUS
  Filled 2018-10-09: qty 98

## 2018-10-09 MED ORDER — SODIUM CHLORIDE 0.9 % IV SOLN
8.0000 mg | Freq: Once | INTRAVENOUS | Status: AC
  Administered 2018-10-09: 8 mg via INTRAVENOUS
  Filled 2018-10-09: qty 4

## 2018-10-09 MED ORDER — SODIUM CHLORIDE 0.9% FLUSH
10.0000 mL | INTRAVENOUS | Status: DC | PRN
  Administered 2018-10-09: 10 mL
  Filled 2018-10-09: qty 10

## 2018-10-09 MED ORDER — LIDOCAINE-PRILOCAINE 2.5-2.5 % EX CREA: TOPICAL_CREAM | CUTANEOUS | 3 refills | Status: DC

## 2018-10-09 MED ORDER — PROCHLORPERAZINE MALEATE 10 MG PO TABS: 10.0000 mg | ORAL_TABLET | Freq: Four times a day (QID) | ORAL | 1 refills | Status: DC | PRN

## 2018-10-09 MED ORDER — SODIUM CHLORIDE 0.9 % IV SOLN
Freq: Once | INTRAVENOUS | Status: AC
  Administered 2018-10-09: 10:00:00 via INTRAVENOUS

## 2018-10-09 MED ORDER — FLUOROURACIL CHEMO INJECTION 2.5 GM/50ML
400.0000 mg/m2 | Freq: Once | INTRAVENOUS | Status: AC
  Administered 2018-10-09: 800 mg via INTRAVENOUS
  Filled 2018-10-09: qty 16

## 2018-10-09 MED ORDER — LEUCOVORIN CALCIUM INJECTION 350 MG
800.0000 mg | Freq: Once | INTRAVENOUS | Status: AC
  Administered 2018-10-09: 800 mg via INTRAVENOUS
  Filled 2018-10-09: qty 35

## 2018-10-09 NOTE — Progress Notes (Signed)
Patient to treatment room for chemotherapy.  Consent signed with information given for review and take home.  All questions asked and answered.  Port site clean and dry with no complaints of pain at site.  No s/s of distress noted.   Patient tolerated treatment with no complaints voiced.  Chemotherapy pump connected with no alarms noted.  Reviewed pump with check off information given.  Chemotherapy spill kit given and reviewed telephone numbers and spill clean up.  Patient and family verbalized understanding.  Good blood return noted with completion of leucovorin before chemo pump connect.  Dressing clean and dry.  VSs with discharge and left ambulatory with no s/s of distress noted.

## 2018-10-09 NOTE — Patient Instructions (Signed)
[facility] Discharge Instructions for Patients Receiving Chemotherapy  Today you received the following chemotherapy agents  If you develop nausea and vomiting that is not controlled by your nausea medication, call the clinic.   BELOW ARE SYMPTOMS THAT SHOULD BE REPORTED IMMEDIATELY:  *FEVER GREATER THAN 100.5 F  *CHILLS WITH OR WITHOUT FEVER  NAUSEA AND VOMITING THAT IS NOT CONTROLLED WITH YOUR NAUSEA MEDICATION  *UNUSUAL SHORTNESS OF BREATH  *UNUSUAL BRUISING OR BLEEDING  TENDERNESS IN MOUTH AND THROAT WITH OR WITHOUT PRESENCE OF ULCERS  *URINARY PROBLEMS  *BOWEL PROBLEMS  UNUSUAL RASH Items with * indicate a potential emergency and should be followed up as soon as possible.  Feel free to call the clinic should you have any questions or concerns. The clinic phone number is [phone].  Please show the CHEMO ALERT CARD at check-in to the Emergency Department and triage nurse.   

## 2018-10-11 ENCOUNTER — Inpatient Hospital Stay (HOSPITAL_COMMUNITY): Payer: Medicare Other

## 2018-10-11 ENCOUNTER — Encounter (HOSPITAL_COMMUNITY): Payer: Self-pay

## 2018-10-11 VITALS — BP 119/64 | HR 84 | Temp 98.6°F | Resp 18

## 2018-10-11 DIAGNOSIS — G629 Polyneuropathy, unspecified: Secondary | ICD-10-CM | POA: Diagnosis not present

## 2018-10-11 DIAGNOSIS — Z5111 Encounter for antineoplastic chemotherapy: Secondary | ICD-10-CM | POA: Diagnosis not present

## 2018-10-11 DIAGNOSIS — C184 Malignant neoplasm of transverse colon: Secondary | ICD-10-CM

## 2018-10-11 DIAGNOSIS — Z79899 Other long term (current) drug therapy: Secondary | ICD-10-CM | POA: Diagnosis not present

## 2018-10-11 MED ORDER — HEPARIN SOD (PORK) LOCK FLUSH 100 UNIT/ML IV SOLN
500.0000 [IU] | Freq: Once | INTRAVENOUS | Status: AC | PRN
  Administered 2018-10-11: 500 [IU]

## 2018-10-11 MED ORDER — SODIUM CHLORIDE 0.9% FLUSH
10.0000 mL | INTRAVENOUS | Status: DC | PRN
  Administered 2018-10-11: 10 mL
  Filled 2018-10-11: qty 10

## 2018-10-11 NOTE — Patient Instructions (Signed)
[facility] at [facility]  Discharge Instructions:   _______________________________________________________________  Thank you for choosing [facility] at [facility] to provide your oncology and hematology care.  To afford each patient quality time with our providers, please arrive at least 15 minutes before your scheduled appointment.  You need to re-schedule your appointment if you arrive 10 or more minutes late.  We strive to give you quality time with our providers, and arriving late affects you and other patients whose appointments are after yours.  Also, if you no show three or more times for appointments you may be dismissed from the clinic.  Again, thank you for choosing [facility] at [facility]. Our hope is that these requests will allow you access to exceptional care and in a timely manner. _______________________________________________________________  If you have questions after your visit, please contact our office at [phone] between the hours of 8:30 a.m. and 5:00 p.m. Voicemails left after 4:30 p.m. will not be returned until the following business day. _______________________________________________________________  For prescription refill requests, have your pharmacy contact our office. _______________________________________________________________  Recommendations made by the consultant and any test results will be sent to your referring physician. _______________________________________________________________ 

## 2018-10-11 NOTE — Progress Notes (Signed)
Jose Lawrence returns today for port de access and flush after 46 hr continous infusion of 15fu. Tolerated infusion without problems. Portacath located left chest wall was  deaccessed and flushed with 24ml NS and 500U/83ml Heparin and needle removed intact.  Procedure without incident. Patient tolerated procedure well.

## 2018-10-18 DIAGNOSIS — C189 Malignant neoplasm of colon, unspecified: Secondary | ICD-10-CM | POA: Diagnosis not present

## 2018-10-18 DIAGNOSIS — Z6825 Body mass index (BMI) 25.0-25.9, adult: Secondary | ICD-10-CM | POA: Diagnosis not present

## 2018-10-18 DIAGNOSIS — E1165 Type 2 diabetes mellitus with hyperglycemia: Secondary | ICD-10-CM | POA: Diagnosis not present

## 2018-10-18 DIAGNOSIS — I2699 Other pulmonary embolism without acute cor pulmonale: Secondary | ICD-10-CM | POA: Diagnosis not present

## 2018-10-18 DIAGNOSIS — I4891 Unspecified atrial fibrillation: Secondary | ICD-10-CM | POA: Diagnosis not present

## 2018-10-18 DIAGNOSIS — I10 Essential (primary) hypertension: Secondary | ICD-10-CM | POA: Diagnosis not present

## 2018-10-18 DIAGNOSIS — Z299 Encounter for prophylactic measures, unspecified: Secondary | ICD-10-CM | POA: Diagnosis not present

## 2018-10-20 ENCOUNTER — Inpatient Hospital Stay (HOSPITAL_BASED_OUTPATIENT_CLINIC_OR_DEPARTMENT_OTHER): Payer: Medicare Other | Admitting: Hematology

## 2018-10-20 ENCOUNTER — Encounter (HOSPITAL_COMMUNITY): Payer: Self-pay | Admitting: Hematology

## 2018-10-20 ENCOUNTER — Other Ambulatory Visit: Payer: Self-pay

## 2018-10-20 ENCOUNTER — Inpatient Hospital Stay (HOSPITAL_COMMUNITY): Payer: Medicare Other

## 2018-10-20 DIAGNOSIS — Z79899 Other long term (current) drug therapy: Secondary | ICD-10-CM | POA: Diagnosis not present

## 2018-10-20 DIAGNOSIS — G629 Polyneuropathy, unspecified: Secondary | ICD-10-CM

## 2018-10-20 DIAGNOSIS — C184 Malignant neoplasm of transverse colon: Secondary | ICD-10-CM

## 2018-10-20 DIAGNOSIS — Z5111 Encounter for antineoplastic chemotherapy: Secondary | ICD-10-CM | POA: Diagnosis not present

## 2018-10-20 LAB — CBC WITH DIFFERENTIAL/PLATELET
Abs Immature Granulocytes: 0 10*3/uL (ref 0.00–0.07)
Basophils Absolute: 0 10*3/uL (ref 0.0–0.1)
Basophils Relative: 1 %
Eosinophils Absolute: 0.1 10*3/uL (ref 0.0–0.5)
Eosinophils Relative: 4 %
HCT: 35.8 % — ABNORMAL LOW (ref 39.0–52.0)
Hemoglobin: 11.5 g/dL — ABNORMAL LOW (ref 13.0–17.0)
Immature Granulocytes: 0 %
LYMPHS PCT: 26 %
Lymphs Abs: 0.7 10*3/uL (ref 0.7–4.0)
MCH: 29 pg (ref 26.0–34.0)
MCHC: 32.1 g/dL (ref 30.0–36.0)
MCV: 90.2 fL (ref 80.0–100.0)
Monocytes Absolute: 0.4 10*3/uL (ref 0.1–1.0)
Monocytes Relative: 13 %
Neutro Abs: 1.5 10*3/uL — ABNORMAL LOW (ref 1.7–7.7)
Neutrophils Relative %: 56 %
Platelets: 121 10*3/uL — ABNORMAL LOW (ref 150–400)
RBC: 3.97 MIL/uL — ABNORMAL LOW (ref 4.22–5.81)
RDW: 19.6 % — ABNORMAL HIGH (ref 11.5–15.5)
WBC: 2.7 10*3/uL — ABNORMAL LOW (ref 4.0–10.5)
nRBC: 0 % (ref 0.0–0.2)

## 2018-10-20 LAB — COMPREHENSIVE METABOLIC PANEL
ALT: 23 U/L (ref 0–44)
ANION GAP: 7 (ref 5–15)
AST: 37 U/L (ref 15–41)
Albumin: 3.3 g/dL — ABNORMAL LOW (ref 3.5–5.0)
Alkaline Phosphatase: 123 U/L (ref 38–126)
BUN: 8 mg/dL (ref 8–23)
CO2: 25 mmol/L (ref 22–32)
Calcium: 9.1 mg/dL (ref 8.9–10.3)
Chloride: 106 mmol/L (ref 98–111)
Creatinine, Ser: 0.99 mg/dL (ref 0.61–1.24)
GFR calc non Af Amer: >60 mL/min (ref >60)
Glucose, Bld: 163 mg/dL — ABNORMAL HIGH (ref 70–99)
Potassium: 4.1 mmol/L (ref 3.5–5.1)
Sodium: 138 mmol/L (ref 135–145)
Total Bilirubin: 0.7 mg/dL (ref 0.3–1.2)
Total Protein: 7.1 g/dL (ref 6.5–8.1)

## 2018-10-20 LAB — LACTATE DEHYDROGENASE: LDH: 134 U/L (ref 98–192)

## 2018-10-20 LAB — MAGNESIUM: Magnesium: 2.1 mg/dL (ref 1.7–2.4)

## 2018-10-20 NOTE — Assessment & Plan Note (Signed)
1.  Stage IIb (T4AN0) transverse colon adenocarcinoma, MSI-stable: - He had a history of colon cancer in June 2008, status post resection and did not require any adjuvant therapy. - He had a recurrence in February 2009, underwent resection with colostomy.  This was followed by 6 months of FOLFOX based chemotherapy sandwiching radiation. - He noticed blood in the colostomy bag.  He underwent colonoscopy on 08/25/2018 which showed malignant partially obstructing tumor in the mid transverse colon with active bleeding.  Other parts of the colon was normal.  Biopsy showed adenocarcinoma.  - Completion colectomy with end ileostomy, lysis of adhesions and bladder repair on 08/29/2018 by Dr. Constance Haw. - Pathology showed pT4a, PN0, 0/16 lymph nodes positive, MMR normal.  Margins were negative. -PET/CT scan on 09/28/2018 shows no findings of active malignancy.  There is some mild activity associated with left upper quadrant ascites and anterior abdominal wall wound sites, likely inflammatory but potentially meriting surveillance.  Liver appears unremarkable.  CEA was 2.0. - Because of his advanced age and pre-existing neuropathy, 6 months of adjuvant chemotherapy with 5-FU/LV was recommended.   -Adjuvant infusional 5-FU/leucovorin cycle 1 started on 10/09/2018. -He felt weak for couple of days after first treatment.  Denied any vomiting.  Denied any excessive diarrhea.  He is emptying the bag 3-4 times since end ileostomy.  He is also taking 4-5 Imodium tablets daily since surgery. -I have reviewed his blood work.  White count is slightly low at 2.7 but his ANC is 1500. -He may proceed with cycle 2 on 10/23/2018.  2.  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  This is improved over time. -He has on and off numbness in the fingertips and toes.  He has trouble buttoning his shirts.  Treated with gabapentin in the past.

## 2018-10-20 NOTE — Progress Notes (Signed)
Jose Lawrence, Jose Lawrence [id number]   CLINIC:  Medical Oncology/Hematology  PCP:  Glenda Chroman, MD [place] [phone]   REASON FOR VISIT:  Follow-up for adenocarcinoma of the colon  CURRENT THERAPY: Infusional 5-FU/leucovorin every 2 weeks  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[place])   09/23/2018 Initial Diagnosis    Cancer of transverse colon ([place])    09/29/2018 Cancer Staging    Staging form: Colon and Rectum, AJCC 8th Edition - Clinical stage from 09/29/2018: Stage IIB (cT4a, cN0, cM0) - Signed by Derek Jack, MD on 09/29/2018    10/09/2018 -  Chemotherapy    The patient had ondansetron (ZOFRAN) 8 mg in sodium chloride 0.9 % 50 mL IVPB, 8 mg (100 % of original dose 8 mg), Intravenous,  Once, 1 of 12 cycles Dose modification: 8 mg (original dose 8 mg, Cycle 1) Administration: 8 mg (10/09/2018) leucovorin 800 mg in dextrose 5 % 250 mL infusion, 820 mg, Intravenous,  Once, 1 of 12 cycles Administration: 800 mg (10/09/2018) fluorouracil (ADRUCIL) chemo injection 800 mg, 400 mg/m2 = 800 mg, Intravenous,  Once, 1 of 12 cycles Administration: 800 mg (10/09/2018) fluorouracil (ADRUCIL) 4,900 mg in sodium chloride 0.9 % 52 mL chemo infusion, 2,400 mg/m2 = 4,900 mg, Intravenous, 1 Day/Dose, 1 of 12 cycles Administration: 4,900 mg (10/09/2018)  for chemotherapy treatment.       CANCER STAGING: Cancer Staging Cancer of transverse colon [facility]) Staging form: Colon and Rectum, AJCC 8th Edition - Clinical stage from 09/29/2018: Stage IIB (cT4a, cN0, cM0) - Signed by Derek Jack, MD on 09/29/2018    INTERVAL HISTORY:  Jose Lawrence 78 y.o. male returns for routine follow-up and consideration for next cycle of chemotherapy.  He is here today by himself. He states that he thinks he did great with his first treatment. He states that he has noticed some bruising since his treatment.  Denies any nausea,  vomiting, or diarrhea. Denies any new pains. Had not noticed any recent bleeding such as epistaxis, hematuria or hematochezia. Denies recent chest pain on exertion, shortness of breath on minimal exertion, pre-syncopal episodes, or palpitations. Denies any numbness or tingling in hands or feet. Denies any recent fevers, infections, or recent hospitalizations. Patient reports appetite at 100% and energy level at 100%.     Overall, he feels ready for next cycle of chemo today.     REVIEW OF SYSTEMS:  Review of Systems  Constitutional: Positive for fatigue.  All other systems reviewed and are negative.    PAST MEDICAL/SURGICAL HISTORY:  Past Medical History:  Diagnosis Date  . Anemia    Causing interruption in anticoagulation  . Atrial fibrillation and flutter ([place])   . BPH (benign prostatic hypertrophy)   . Colon cancer ([place])    Status post LAR 2008  . Hypothyroidism   . Lacunar infarction ([place]) 11/2012   RT 3rd NERVE PALSY, SB [name]  . PE (pulmonary embolism)    Temporarily on Eliquis 2017  . Skin cancer    35 years ago  . Type 2 diabetes mellitus ([place])    Past Surgical History:  Procedure Laterality Date  . ABDOMINOPERINEAL PROCTOCOLECTOMY  2009   end colostomy   . BIOPSY  08/25/2018   Procedure: BIOPSY;  Surgeon: Rogene Houston, MD;  Location: [facility] ENDO SUITE;  Service: Endoscopy;;  . BLADDER REPAIR  08/29/2018   Procedure: BLADDER REPAIR;  Surgeon: Constance Haw,  Lanell Matar, MD;  Location: [facility] ORS;  Service: General;;  . COLECTOMY  2008  . COLONOSCOPY WITH PROPOFOL N/A 08/25/2018   Procedure: COLONOSCOPY WITH PROPOFOL;  Surgeon: Rogene Houston, MD;  Location: [facility] ENDO SUITE;  Service: Endoscopy;  Laterality: N/A;  2:20  . COLOSTOMY    . EXPLORATORY LAPAROTOMY  2017   ? pneumoperitoneum of unknown etiology/ negative Ex lap  . ILEOSTOMY  08/29/2018   Procedure: ILEOSTOMY;  Surgeon: [name], MD;  Location: [facility] ORS;  Service: General;;  . LAPAROSCOPIC  CHOLECYSTECTOMY  2007  . LYSIS OF ADHESION  08/29/2018   Procedure: LYSIS OF ADHESION;  Surgeon: [name], MD;  Location: [facility] ORS;  Service: General;;  . PARTIAL COLECTOMY N/A 08/29/2018   Procedure: PARTIAL COLECTOMY;  Surgeon: [name], MD;  Location: [facility] ORS;  Service: General;  Laterality: N/A;  . PORTACATH PLACEMENT Left 10/04/2018   Procedure: INSERTION PORT-A-CATH (attached catheter left subclavian);  Surgeon: [name], MD;  Location: [facility] ORS;  Service: General;  Laterality: Left;  . SBO SURGERY  06/2009     SOCIAL HISTORY:  Social History   Socioeconomic History  . Marital status: Married    Spouse name: Not on file  . Number of children: Not on file  . Years of education: Not on file  . Highest education level: Not on file  Occupational History  . Occupation: Architect    Comment: [employer]  Social Needs  . Financial resource strain: Not hard at all  . Food insecurity:    Worry: Never true    Inability: Never true  . Transportation needs:    Medical: No    Non-medical: No  Tobacco Use  . Smoking status: Former Smoker    Packs/day: 1.00    Years: 27.00    Pack years: 27.00    Types: Cigarettes    Start date: 01/27/1964    Last attempt to quit: 01/27/1991    Years since quitting: 27.7  . Smokeless tobacco: Former Systems developer    Types: [place] date: 08/10/1995  Substance and Sexual Activity  . Alcohol use: Never    Alcohol/week: 0.0 standard drinks    Frequency: Never  . Drug use: Never  . Sexual activity: Not Currently  Lifestyle  . Physical activity:    Days per week: 0 days    Minutes per session: 0 min  . Stress: Not at all  Relationships  . Social connections:    Talks on phone: Twice a week    Gets together: Once a week    Attends religious service: Not on file    Active member of club or organization: Not on file    Attends meetings of clubs or organizations: Not on file    Relationship status: Not on file  . Intimate  partner violence:    Fear of current or ex partner: No    Emotionally abused: No    Physically abused: No    Forced sexual activity: No  Other Topics Concern  . Not on file  Social History Narrative  . Not on file    FAMILY HISTORY:  Family History  Problem Relation Age of Onset  . Heart disease Father   . Heart failure Father   . Heart attack Father   . Prostate cancer Paternal Uncle     CURRENT MEDICATIONS:  Outpatient Encounter Medications as of 10/20/2018  Medication Sig Note  . aspirin 81 MG tablet Take 81  mg by mouth daily.   . cetirizine (ZYRTEC) 10 MG tablet Take 10 mg by mouth daily.   . ferrous sulfate (FERROUSUL) 325 (65 FE) MG tablet Take 1 tablet (325 mg total) by mouth 2 (two) times daily with a meal.   . glipiZIDE (GLUCOTROL) 10 MG tablet Take 10 mg by mouth 2 (two) times daily.   Marland Kitchen levothyroxine (SYNTHROID, LEVOTHROID) 50 MCG tablet Take 50 mcg by mouth daily before breakfast.   . lidocaine-prilocaine (EMLA) cream Apply to affected area once   . loperamide (IMODIUM) 2 MG capsule Take 1 capsule (2 mg total) by mouth 4 (four) times daily -  before meals and at bedtime.   . metoprolol tartrate (LOPRESSOR) 25 MG tablet Take 1 tablet (25 mg total) by mouth 2 (two) times daily.   . prochlorperazine (COMPAZINE) 10 MG tablet Take 1 tablet (10 mg total) by mouth every 6 (six) hours as needed (Nausea or vomiting).   . psyllium (HYDROCIL/METAMUCIL) 95 % PACK Take 1 packet by mouth daily.   . tamsulosin (FLOMAX) 0.4 MG CAPS capsule Take 0.4 mg by mouth at bedtime.    . vitamin C (ASCORBIC ACID) 500 MG tablet Take 1 tablet (500 mg total) by mouth daily.   . [DISCONTINUED] ciprofloxacin (CIPRO) 250 MG tablet Take 250 mg by mouth 2 (two) times daily.   . [DISCONTINUED] sulfamethoxazole-trimethoprim (BACTRIM DS,SEPTRA DS) 800-160 MG tablet Take 1 tablet by mouth 2 (two) times daily. 09/22/2018: Should be finished on 09/23/2018   Facility-Administered Encounter Medications as of  10/20/2018  Medication  . iothalamate meglumine (CYSTO-CONRAY II) 17.2 % solution 250 mL    ALLERGIES:  No Known Allergies   PHYSICAL EXAM:  ECOG Performance status: 1  I have reviewed his vitals.  Blood pressure is 139/73, pulse rate is 68, respiratory rate 16, temperature is 98.  Physical Exam Constitutional:      Appearance: Normal appearance.  Cardiovascular:     Rate and Rhythm: Normal rate and regular rhythm.  Pulmonary:     Effort: Pulmonary effort is normal.     Breath sounds: Normal breath sounds.  Abdominal:     General: Bowel sounds are normal. There is no distension.     Palpations: Abdomen is soft.  Musculoskeletal:        General: No swelling.  Neurological:     General: No focal deficit present.     Mental Status: He is alert and oriented to person, place, and time.  Psychiatric:        Mood and Affect: Mood normal.        Behavior: Behavior normal.      LABORATORY DATA:  I have reviewed the labs as listed.  CBC    Component Value Date/Time   WBC 2.7 (L) 10/20/2018 0915   RBC 3.97 (L) 10/20/2018 0915   HGB 11.5 (L) 10/20/2018 0915   HGB 11.8 (L) 09/14/2018 1514   HCT 35.8 (L) 10/20/2018 0915   HCT 38.4 09/14/2018 1514   PLT 121 (L) 10/20/2018 0915   PLT 468 (H) 09/14/2018 1514   MCV 90.2 10/20/2018 0915   MCV 87 09/14/2018 1514   MCH 29.0 10/20/2018 0915   MCHC 32.1 10/20/2018 0915   RDW 19.6 (H) 10/20/2018 0915   RDW 14.3 09/14/2018 1514   LYMPHSABS 0.7 10/20/2018 0915   LYMPHSABS 1.8 09/14/2018 1514   MONOABS 0.4 10/20/2018 0915   EOSABS 0.1 10/20/2018 0915   EOSABS 0.6 (H) 09/14/2018 1514   BASOSABS 0.0 10/20/2018 0915  BASOSABS 0.1 09/14/2018 1514   CMP Latest Ref Rng & Units 10/20/2018 10/09/2018 09/14/2018  Glucose 70 - 99 mg/dL 163(H) 216(H) 301(H)  BUN 8 - 23 mg/dL 8 9 7(L)  Creatinine 0.61 - 1.24 mg/dL 0.99 0.85 1.00  Sodium 135 - 145 mmol/L 138 135 135  Potassium 3.5 - 5.1 mmol/L 4.1 4.5 5.3(H)  Chloride 98 - 111 mmol/L 106 103  98  CO2 22 - 32 mmol/L _0 Calcium 8.9 - 10.3 mg/dL 9.1 9.0 9.2  Total Protein 6.5 - 8.1 g/dL 7.1 7.2 -  Total Bilirubin 0.3 - 1.2 mg/dL 0.7 0.4 -  Alkaline Phos 38 - 126 U/L 123 120 -  AST 15 - 41 U/L 37 32 -  ALT 0 - 44 U/L 23 18 -       DIAGNOSTIC IMAGING:  I have independently reviewed the scans and discussed with the patient.   I have reviewed Venita Lick LPN's note and agree with the documentation.  I personally performed a face-to-face visit, made revisions and my assessment and plan is as follows.    ASSESSMENT & PLAN:   Cancer of transverse colon ([place]) 1.  Stage IIb (T4AN0) transverse colon adenocarcinoma, MSI-stable: - He had a history of colon cancer in June 2008, status post resection and did not require any adjuvant therapy. - He had a recurrence in February 2009, underwent resection with colostomy.  This was followed by 6 months of FOLFOX based chemotherapy sandwiching radiation. - He noticed blood in the colostomy bag.  He underwent colonoscopy on 08/25/2018 which showed malignant partially obstructing tumor in the mid transverse colon with active bleeding.  Other parts of the colon was normal.  Biopsy showed adenocarcinoma.  - Completion colectomy with end ileostomy, lysis of adhesions and bladder repair on 08/29/2018 by Dr. Constance Haw. - Pathology showed pT4a, PN0, 0/16 lymph nodes positive, MMR normal.  Margins were negative. -PET/CT scan on 09/28/2018 shows no findings of active malignancy.  There is some mild activity associated with left upper quadrant ascites and anterior abdominal wall wound sites, likely inflammatory but potentially meriting surveillance.  Liver appears unremarkable.  CEA was 2.0. - Because of his advanced age and pre-existing neuropathy, 6 months of adjuvant chemotherapy with 5-FU/LV was recommended.   -Adjuvant infusional 5-FU/leucovorin cycle 1 started on 10/09/2018. -He felt weak for couple of days after first treatment.  Denied any  vomiting.  Denied any excessive diarrhea.  He is emptying the bag 3-4 times since end ileostomy.  He is also taking 4-5 Imodium tablets daily since surgery. -I have reviewed his blood work.  White count is slightly low at 2.7 but his ANC is 1500. -He may proceed with cycle 2 on 10/23/2018.  2.  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  This is improved over time. -He has on and off numbness in the fingertips and toes.  He has trouble buttoning his shirts.  Treated with gabapentin in the past.        Orders placed this encounter:  No orders of the defined types were placed in this encounter.     Derek Jack, MD [place] [phone]

## 2018-10-20 NOTE — Patient Instructions (Addendum)
[facility] at [facility] Discharge Instructions  You were seen today by Dr. Katragadda. He went over your recent lab results. He will see you back in 2 weeks for labs, treatment and follow up.   Thank you for choosing [facility] at [facility] to provide your oncology and hematology care.  To afford each patient quality time with our provider, please arrive at least 15 minutes before your scheduled appointment time.   If you have a lab appointment with the Cancer Center please come in thru the  Main Entrance and check in at the main information desk  You need to re-schedule your appointment should you arrive 10 or more minutes late.  We strive to give you quality time with our providers, and arriving late affects you and other patients whose appointments are after yours.  Also, if you no show three or more times for appointments you may be dismissed from the clinic at the providers discretion.     Again, thank you for choosing [facility].  Our hope is that these requests will decrease the amount of time that you wait before being seen by our physicians.       _____________________________________________________________  Should you have questions after your visit to  Cancer Center, please contact our office at [phone] between the hours of 8:00 a.m. and 4:30 p.m.  Voicemails left after 4:00 p.m. will not be returned until the following business day.  For prescription refill requests, have your pharmacy contact our office and allow 72 hours.    Cancer Center Support Programs:   > Cancer Support Group  2nd Tuesday of the month 1pm-2pm, Journey Room    

## 2018-10-21 LAB — CEA: CEA: 1.4 ng/mL (ref 0.0–4.7)

## 2018-10-23 ENCOUNTER — Other Ambulatory Visit: Payer: Self-pay

## 2018-10-23 ENCOUNTER — Other Ambulatory Visit (HOSPITAL_COMMUNITY): Payer: Medicare Other

## 2018-10-23 ENCOUNTER — Inpatient Hospital Stay (HOSPITAL_COMMUNITY): Payer: Medicare Other

## 2018-10-23 ENCOUNTER — Encounter (HOSPITAL_COMMUNITY): Payer: Self-pay

## 2018-10-23 VITALS — BP 132/75 | HR 63 | Temp 97.5°F | Resp 18 | Wt 188.0 lb

## 2018-10-23 DIAGNOSIS — Z5111 Encounter for antineoplastic chemotherapy: Secondary | ICD-10-CM | POA: Diagnosis not present

## 2018-10-23 DIAGNOSIS — Z79899 Other long term (current) drug therapy: Secondary | ICD-10-CM | POA: Diagnosis not present

## 2018-10-23 DIAGNOSIS — G629 Polyneuropathy, unspecified: Secondary | ICD-10-CM | POA: Diagnosis not present

## 2018-10-23 DIAGNOSIS — C184 Malignant neoplasm of transverse colon: Secondary | ICD-10-CM | POA: Diagnosis not present

## 2018-10-23 MED ORDER — SODIUM CHLORIDE 0.9 % IV SOLN
2400.0000 mg/m2 | INTRAVENOUS | Status: DC
  Administered 2018-10-23: 4900 mg via INTRAVENOUS
  Filled 2018-10-23: qty 98

## 2018-10-23 MED ORDER — SODIUM CHLORIDE 0.9% FLUSH
10.0000 mL | INTRAVENOUS | Status: DC | PRN
  Administered 2018-10-23: 10 mL
  Filled 2018-10-23: qty 10

## 2018-10-23 MED ORDER — SODIUM CHLORIDE 0.9 % IV SOLN
8.0000 mg | Freq: Once | INTRAVENOUS | Status: AC
  Administered 2018-10-23: 8 mg via INTRAVENOUS
  Filled 2018-10-23: qty 4

## 2018-10-23 MED ORDER — SODIUM CHLORIDE 0.9 % IV SOLN
Freq: Once | INTRAVENOUS | Status: AC
  Administered 2018-10-23: 09:00:00 via INTRAVENOUS

## 2018-10-23 MED ORDER — FLUOROURACIL CHEMO INJECTION 2.5 GM/50ML
400.0000 mg/m2 | Freq: Once | INTRAVENOUS | Status: AC
  Administered 2018-10-23: 800 mg via INTRAVENOUS
  Filled 2018-10-23: qty 16

## 2018-10-23 MED ORDER — LEUCOVORIN CALCIUM INJECTION 350 MG
400.0000 mg/m2 | Freq: Once | INTRAVENOUS | Status: AC
  Administered 2018-10-23: 820 mg via INTRAVENOUS
  Filled 2018-10-23: qty 25

## 2018-10-23 NOTE — Patient Instructions (Signed)
[facility] Discharge Instructions for Patients Receiving Chemotherapy  Today you received the following chemotherapy agents   To help prevent nausea and vomiting after your treatment, we encourage you to take your nausea medication   If you develop nausea and vomiting that is not controlled by your nausea medication, call the clinic.   BELOW ARE SYMPTOMS THAT SHOULD BE REPORTED IMMEDIATELY:  *FEVER GREATER THAN 100.5 F  *CHILLS WITH OR WITHOUT FEVER  NAUSEA AND VOMITING THAT IS NOT CONTROLLED WITH YOUR NAUSEA MEDICATION  *UNUSUAL SHORTNESS OF BREATH  *UNUSUAL BRUISING OR BLEEDING  TENDERNESS IN MOUTH AND THROAT WITH OR WITHOUT PRESENCE OF ULCERS  *URINARY PROBLEMS  *BOWEL PROBLEMS  UNUSUAL RASH Items with * indicate a potential emergency and should be followed up as soon as possible.  Feel free to call the clinic should you have any questions or concerns. The clinic phone number is [phone].  Please show the CHEMO ALERT CARD at check-in to the Emergency Department and triage nurse.   

## 2018-10-23 NOTE — Progress Notes (Signed)
Pt presents today for Carbo/ VP-16. VSS. No complaints of any changes since the last visit. MAR reviewed. Labs reviewed. Proceed with treatment. Parameters met.   Treatment given today per MD orders. Tolerated infusion without adverse affects. Vital signs stable. No complaints at this time. Discharged from clinic ambulatory. F/U with [facility] as scheduled.

## 2018-10-25 ENCOUNTER — Other Ambulatory Visit: Payer: Self-pay

## 2018-10-25 ENCOUNTER — Encounter (HOSPITAL_COMMUNITY): Payer: Self-pay

## 2018-10-25 ENCOUNTER — Inpatient Hospital Stay (HOSPITAL_COMMUNITY): Payer: Medicare Other

## 2018-10-25 VITALS — BP 122/66 | HR 79 | Temp 98.7°F | Resp 18

## 2018-10-25 DIAGNOSIS — G629 Polyneuropathy, unspecified: Secondary | ICD-10-CM | POA: Diagnosis not present

## 2018-10-25 DIAGNOSIS — C184 Malignant neoplasm of transverse colon: Secondary | ICD-10-CM | POA: Diagnosis not present

## 2018-10-25 DIAGNOSIS — Z79899 Other long term (current) drug therapy: Secondary | ICD-10-CM | POA: Diagnosis not present

## 2018-10-25 DIAGNOSIS — Z5111 Encounter for antineoplastic chemotherapy: Secondary | ICD-10-CM | POA: Diagnosis not present

## 2018-10-25 MED ORDER — SODIUM CHLORIDE 0.9% FLUSH
10.0000 mL | INTRAVENOUS | Status: DC | PRN
  Administered 2018-10-25: 10 mL
  Filled 2018-10-25: qty 10

## 2018-10-25 MED ORDER — HEPARIN SOD (PORK) LOCK FLUSH 100 UNIT/ML IV SOLN
500.0000 [IU] | Freq: Once | INTRAVENOUS | Status: AC | PRN
  Administered 2018-10-25: 500 [IU]

## 2018-10-25 NOTE — Patient Instructions (Signed)
[place] at [facility] Discharge Instructions  5FU pump discontinued with portacath flushed today per protocol. Follow-up as scheduled. Call clinic for any questions or concerns   Thank you for choosing [place] at [facility] to provide your oncology and hematology care.  To afford each patient quality time with our provider, please arrive at least 15 minutes before your scheduled appointment time.   If you have a lab appointment with the [facility] please come in thru the  Main Entrance and check in at the main information desk  You need to re-schedule your appointment should you arrive 10 or more minutes late.  We strive to give you quality time with our providers, and arriving late affects you and other patients whose appointments are after yours.  Also, if you no show three or more times for appointments you may be dismissed from the clinic at the providers discretion.     Again, thank you for choosing [facility].  Our hope is that these requests will decrease the amount of time that you wait before being seen by our physicians.       _____________________________________________________________  Should you have questions after your visit to [facility], please contact our office at [phone] between the hours of 8:00 a.m. and 4:30 p.m.  Voicemails left after 4:00 p.m. will not be returned until the following business day.  For prescription refill requests, have your pharmacy contact our office and allow 72 hours.    Cancer Center Support Programs:   > Cancer Support Group  2nd Tuesday of the month 1pm-2pm, Journey Room

## 2018-10-25 NOTE — Progress Notes (Signed)
[place] tolerated 5FU pump well without complaints or incident. 5FU pump discontinued with portacath flushed with 10 ml NS and 5 ml Heparin easily per protocol then de-accessed. VSS Pt discharged self ambulatory in satisfactory condition

## 2018-11-06 ENCOUNTER — Inpatient Hospital Stay (HOSPITAL_COMMUNITY): Payer: Medicare Other

## 2018-11-06 ENCOUNTER — Inpatient Hospital Stay (HOSPITAL_BASED_OUTPATIENT_CLINIC_OR_DEPARTMENT_OTHER): Payer: Medicare Other | Admitting: Hematology

## 2018-11-06 ENCOUNTER — Encounter (HOSPITAL_COMMUNITY): Payer: Self-pay

## 2018-11-06 ENCOUNTER — Encounter (HOSPITAL_COMMUNITY): Payer: Self-pay | Admitting: Hematology

## 2018-11-06 ENCOUNTER — Other Ambulatory Visit: Payer: Self-pay

## 2018-11-06 VITALS — BP 137/77 | HR 69 | Temp 97.5°F | Resp 18

## 2018-11-06 VITALS — BP 133/71 | HR 78 | Temp 98.2°F | Resp 18 | Wt 188.6 lb

## 2018-11-06 DIAGNOSIS — G629 Polyneuropathy, unspecified: Secondary | ICD-10-CM | POA: Diagnosis not present

## 2018-11-06 DIAGNOSIS — C184 Malignant neoplasm of transverse colon: Secondary | ICD-10-CM

## 2018-11-06 DIAGNOSIS — Z79899 Other long term (current) drug therapy: Secondary | ICD-10-CM | POA: Diagnosis not present

## 2018-11-06 DIAGNOSIS — Z5111 Encounter for antineoplastic chemotherapy: Secondary | ICD-10-CM | POA: Diagnosis not present

## 2018-11-06 LAB — CBC WITH DIFFERENTIAL/PLATELET
Abs Immature Granulocytes: 0.01 10*3/uL (ref 0.00–0.07)
Basophils Absolute: 0 10*3/uL (ref 0.0–0.1)
Basophils Relative: 1 %
Eosinophils Absolute: 0.1 10*3/uL (ref 0.0–0.5)
Eosinophils Relative: 3 %
HCT: 35.7 % — ABNORMAL LOW (ref 39.0–52.0)
Hemoglobin: 11.5 g/dL — ABNORMAL LOW (ref 13.0–17.0)
Immature Granulocytes: 0 %
Lymphocytes Relative: 27 %
Lymphs Abs: 0.9 10*3/uL (ref 0.7–4.0)
MCH: 29.6 pg (ref 26.0–34.0)
MCHC: 32.2 g/dL (ref 30.0–36.0)
MCV: 91.8 fL (ref 80.0–100.0)
MONO ABS: 0.4 10*3/uL (ref 0.1–1.0)
Monocytes Relative: 12 %
Neutro Abs: 2 10*3/uL (ref 1.7–7.7)
Neutrophils Relative %: 57 %
Platelets: 115 10*3/uL — ABNORMAL LOW (ref 150–400)
RBC: 3.89 MIL/uL — ABNORMAL LOW (ref 4.22–5.81)
RDW: 21.2 % — ABNORMAL HIGH (ref 11.5–15.5)
WBC: 3.5 10*3/uL — ABNORMAL LOW (ref 4.0–10.5)
nRBC: 0 % (ref 0.0–0.2)

## 2018-11-06 LAB — COMPREHENSIVE METABOLIC PANEL
ALK PHOS: 106 U/L (ref 38–126)
ALT: 23 U/L (ref 0–44)
AST: 38 U/L (ref 15–41)
Albumin: 3.4 g/dL — ABNORMAL LOW (ref 3.5–5.0)
Anion gap: 7 (ref 5–15)
BUN: 10 mg/dL (ref 8–23)
CO2: 23 mmol/L (ref 22–32)
Calcium: 8.8 mg/dL — ABNORMAL LOW (ref 8.9–10.3)
Chloride: 106 mmol/L (ref 98–111)
Creatinine, Ser: 0.95 mg/dL (ref 0.61–1.24)
GFR calc Af Amer: >60 mL/min (ref >60)
GFR calc non Af Amer: >60 mL/min (ref >60)
Glucose, Bld: 122 mg/dL — ABNORMAL HIGH (ref 70–99)
Potassium: 3.8 mmol/L (ref 3.5–5.1)
Sodium: 136 mmol/L (ref 135–145)
Total Bilirubin: 0.8 mg/dL (ref 0.3–1.2)
Total Protein: 7.1 g/dL (ref 6.5–8.1)

## 2018-11-06 MED ORDER — SODIUM CHLORIDE 0.9 % IV SOLN
Freq: Once | INTRAVENOUS | Status: AC
  Administered 2018-11-06: 09:00:00 via INTRAVENOUS

## 2018-11-06 MED ORDER — SODIUM CHLORIDE 0.9% FLUSH
10.0000 mL | INTRAVENOUS | Status: DC | PRN
  Administered 2018-11-06: 10 mL
  Filled 2018-11-06: qty 10

## 2018-11-06 MED ORDER — SODIUM CHLORIDE 0.9 % IV SOLN
2430.0000 mg/m2 | INTRAVENOUS | Status: DC
  Administered 2018-11-06: 5000 mg via INTRAVENOUS
  Filled 2018-11-06: qty 100

## 2018-11-06 MED ORDER — SODIUM CHLORIDE 0.9 % IV SOLN
8.0000 mg | Freq: Once | INTRAVENOUS | Status: AC
  Administered 2018-11-06: 8 mg via INTRAVENOUS
  Filled 2018-11-06: qty 4

## 2018-11-06 MED ORDER — LEUCOVORIN CALCIUM INJECTION 350 MG
400.0000 mg/m2 | Freq: Once | INTRAVENOUS | Status: AC
  Administered 2018-11-06: 820 mg via INTRAVENOUS
  Filled 2018-11-06: qty 41

## 2018-11-06 MED ORDER — FLUOROURACIL CHEMO INJECTION 2.5 GM/50ML
400.0000 mg/m2 | Freq: Once | INTRAVENOUS | Status: AC
  Administered 2018-11-06: 800 mg via INTRAVENOUS
  Filled 2018-11-06: qty 16

## 2018-11-06 NOTE — Assessment & Plan Note (Signed)
1.  Stage IIb (T4AN0) transverse colon adenocarcinoma, MSI-stable: - He had a history of colon cancer in June 2008, status post resection and did not require any adjuvant therapy. - He had a recurrence in February 2009, underwent resection with colostomy.  This was followed by 6 months of FOLFOX based chemotherapy sandwiching radiation. - He noticed blood in the colostomy bag.  He underwent colonoscopy on 08/25/2018 which showed malignant partially obstructing tumor in the mid transverse colon with active bleeding.  Other parts of the colon was normal.  Biopsy showed adenocarcinoma.  - Completion colectomy with end ileostomy, lysis of adhesions and bladder repair on 08/29/2018 by Dr. Constance Haw. - Pathology showed pT4a, PN0, 0/16 lymph nodes positive, MMR normal.  Margins were negative. -PET/CT scan on 09/28/2018 shows no findings of active malignancy.  There is some mild activity associated with left upper quadrant ascites and anterior abdominal wall wound sites, likely inflammatory but potentially meriting surveillance.  Liver appears unremarkable.  CEA was 2.0. - Because of his advanced age and pre-existing neuropathy, 6 months of adjuvant chemotherapy with 5-FU/LV was recommended.   -Adjuvant infusional 5-FU/leucovorin started on 10/09/2018. -Cycle 2 was on 10/23/2018. -Denies any diarrhea.  He is emptying bag 3-4 times since the end ileostomy.  He also takes 4-5 Imodium tablets daily since surgery. -He has noticed more numbness in the fingertips which is not affecting any of his activities. -I have reviewed his blood work.  White count and platelets are adequate to proceed with his next cycle of treatment. -She will come back in 2 weeks for follow-up.  2.  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  This has improved over time.  He was treated with gabapentin in the past. - He has more numbness in the fingertips for the last 2 weeks.  It is not  affecting any of his activities.

## 2018-11-06 NOTE — Patient Instructions (Signed)
[facility] Discharge Instructions for Patients Receiving Chemotherapy  Today you received the following chemotherapy agents  If you develop nausea and vomiting that is not controlled by your nausea medication, call the clinic.   BELOW ARE SYMPTOMS THAT SHOULD BE REPORTED IMMEDIATELY:  *FEVER GREATER THAN 100.5 F  *CHILLS WITH OR WITHOUT FEVER  NAUSEA AND VOMITING THAT IS NOT CONTROLLED WITH YOUR NAUSEA MEDICATION  *UNUSUAL SHORTNESS OF BREATH  *UNUSUAL BRUISING OR BLEEDING  TENDERNESS IN MOUTH AND THROAT WITH OR WITHOUT PRESENCE OF ULCERS  *URINARY PROBLEMS  *BOWEL PROBLEMS  UNUSUAL RASH Items with * indicate a potential emergency and should be followed up as soon as possible.  Feel free to call the clinic should you have any questions or concerns. The clinic phone number is [phone].  Please show the CHEMO ALERT CARD at check-in to the Emergency Department and triage nurse.   

## 2018-11-06 NOTE — Progress Notes (Signed)
Patient seen by Dr. Omar Person with lab review and ok to treat today verbal order.   Patient tolerated chemotherapy with no complaints voiced.  Port site clean and dry with no bruising or swelling noted at site.  Good blood return noted before and after administration of chemotherapy.  Chemotherapy pump connected with no alarms noted.   Patient left ambulatory with VSS and no s/s of distress noted.

## 2018-11-06 NOTE — Progress Notes (Signed)
Jose Lawrence, [place] [address]   CLINIC:  Medical Oncology/Hematology  PCP:  Jose Chroman, MD [place] [address] [phone]   REASON FOR VISIT:  Follow-up for colon cancer treatment.   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[place])   09/23/2018 Initial Diagnosis    Cancer of transverse colon ([place])    09/29/2018 Cancer Staging    Staging form: Colon and Rectum, AJCC 8th Edition - Clinical stage from 09/29/2018: Stage IIB (cT4a, cN0, cM0) - Signed by Jose Jack, MD on 09/29/2018    10/09/2018 -  Chemotherapy    The patient had ondansetron (ZOFRAN) 8 mg in sodium chloride 0.9 % 50 mL IVPB, 8 mg (100 % of original dose 8 mg), Intravenous,  Once, 2 of 12 cycles Dose modification: 8 mg (original dose 8 mg, Cycle 1) Administration: 8 mg (10/09/2018), 8 mg (10/23/2018) leucovorin 800 mg in dextrose 5 % 250 mL infusion, 820 mg, Intravenous,  Once, 2 of 12 cycles Administration: 800 mg (10/09/2018), 820 mg (10/23/2018) fluorouracil (ADRUCIL) chemo injection 800 mg, 400 mg/m2 = 800 mg, Intravenous,  Once, 2 of 12 cycles Administration: 800 mg (10/09/2018), 800 mg (10/23/2018) fluorouracil (ADRUCIL) 4,900 mg in sodium chloride 0.9 % 52 mL chemo infusion, 2,400 mg/m2 = 4,900 mg, Intravenous, 1 Day/Dose, 2 of 12 cycles Administration: 4,900 mg (10/09/2018), 4,900 mg (10/23/2018)  for chemotherapy treatment.       CANCER STAGING: Cancer Staging Cancer of transverse colon [facility]) Staging form: Colon and Rectum, AJCC 8th Edition - Clinical stage from 09/29/2018: Stage IIB (cT4a, cN0, cM0) - Signed by Jose Jack, MD on 09/29/2018    INTERVAL HISTORY:  Jose Lawrence 78 y.o. male returns for routine follow-up and consideration for next cycle of chemotherapy.  He is here today alone. He states that he has noticed numbness in his feet that feels different from the neuropathy. He states that the numbness in his hands has changed  as well, no trouble  Denies any nausea, vomiting, or diarrhea. Denies any new pains. Had not noticed any recent bleeding such as epistaxis, hematuria or hematochezia. Denies recent chest pain on exertion, shortness of breath on minimal exertion, pre-syncopal episodes, or palpitations. Denies any numbness or tingling in hands or feet. Denies any recent fevers, infections, or recent hospitalizations. Patient reports appetite at 100% and energy level at 100%.    REVIEW OF SYSTEMS:  Review of Systems  HENT:   Positive for mouth sores.   Neurological: Positive for numbness.     PAST MEDICAL/SURGICAL HISTORY:  Past Medical History:  Diagnosis Date  . Anemia    Causing interruption in anticoagulation  . Atrial fibrillation and flutter ([place])   . BPH (benign prostatic hypertrophy)   . Colon cancer ([place])    Status post LAR 2008  . Hypothyroidism   . Lacunar infarction ([place]) 11/2012   RT 3rd NERVE PALSY, SB [name]  . PE (pulmonary embolism)    Temporarily on Eliquis 2017  . Skin cancer    35 years ago  . Type 2 diabetes mellitus ([place])    Past Surgical History:  Procedure Laterality Date  . ABDOMINOPERINEAL PROCTOCOLECTOMY  2009   end colostomy   . BIOPSY  08/25/2018   Procedure: BIOPSY;  Surgeon: Rogene Houston, MD;  Location: [facility] ENDO SUITE;  Service: Endoscopy;;  . BLADDER REPAIR  08/29/2018   Procedure: BLADDER REPAIR;  Surgeon: [name], MD;  Location: [facility] ORS;  Service: General;;  . COLECTOMY  2008  . COLONOSCOPY WITH PROPOFOL N/A 08/25/2018   Procedure: COLONOSCOPY WITH PROPOFOL;  Surgeon: Rogene Houston, MD;  Location: [facility] ENDO SUITE;  Service: Endoscopy;  Laterality: N/A;  2:20  . COLOSTOMY    . EXPLORATORY LAPAROTOMY  2017   ? pneumoperitoneum of unknown etiology/ negative Ex lap  . ILEOSTOMY  08/29/2018   Procedure: ILEOSTOMY;  Surgeon: [name], MD;  Location: [facility] ORS;  Service: General;;  . LAPAROSCOPIC CHOLECYSTECTOMY  2007  . LYSIS OF ADHESION   08/29/2018   Procedure: LYSIS OF ADHESION;  Surgeon: [name], MD;  Location: [facility] ORS;  Service: General;;  . PARTIAL COLECTOMY N/A 08/29/2018   Procedure: PARTIAL COLECTOMY;  Surgeon: [name], MD;  Location: [facility] ORS;  Service: General;  Laterality: N/A;  . PORTACATH PLACEMENT Left 10/04/2018   Procedure: INSERTION PORT-A-CATH (attached catheter left subclavian);  Surgeon: [name], MD;  Location: [facility] ORS;  Service: General;  Laterality: Left;  . SBO SURGERY  06/2009     SOCIAL HISTORY:  Social History   Socioeconomic History  . Marital status: Married    Spouse name: Not on file  . Number of children: Not on file  . Years of education: Not on file  . Highest education level: Not on file  Occupational History  . Occupation: Architect    Comment: [employer]  Social Needs  . Financial resource strain: Not hard at all  . Food insecurity:    Worry: Never true    Inability: Never true  . Transportation needs:    Medical: No    Non-medical: No  Tobacco Use  . Smoking status: Former Smoker    Packs/day: 1.00    Years: 27.00    Pack years: 27.00    Types: Cigarettes    Start date: 01/27/1964    Last attempt to quit: 01/27/1991    Years since quitting: 27.7  . Smokeless tobacco: Former Systems developer    Types: [place] date: 08/10/1995  Substance and Sexual Activity  . Alcohol use: Never    Alcohol/week: 0.0 standard drinks    Frequency: Never  . Drug use: Never  . Sexual activity: Not Currently  Lifestyle  . Physical activity:    Days per week: 0 days    Minutes per session: 0 min  . Stress: Not at all  Relationships  . Social connections:    Talks on phone: Twice a week    Gets together: Once a week    Attends religious service: Not on file    Active member of club or organization: Not on file    Attends meetings of clubs or organizations: Not on file    Relationship status: Not on file  . Intimate partner violence:    Fear of current or ex  partner: No    Emotionally abused: No    Physically abused: No    Forced sexual activity: No  Other Topics Concern  . Not on file  Social History Narrative  . Not on file    FAMILY HISTORY:  Family History  Problem Relation Age of Onset  . Heart disease Father   . Heart failure Father   . Heart attack Father   . Prostate cancer Paternal Uncle     CURRENT MEDICATIONS:  Outpatient Encounter Medications as of 11/06/2018  Medication Sig  . aspirin 81 MG tablet Take 81 mg by mouth daily.  Marland Kitchen  cetirizine (ZYRTEC) 10 MG tablet Take 10 mg by mouth daily.  . ferrous sulfate (FERROUSUL) 325 (65 FE) MG tablet Take 1 tablet (325 mg total) by mouth 2 (two) times daily with a meal.  . glipiZIDE (GLUCOTROL) 10 MG tablet Take 10 mg by mouth 2 (two) times daily.  Marland Kitchen levothyroxine (SYNTHROID, LEVOTHROID) 50 MCG tablet Take 50 mcg by mouth daily before breakfast.  . lidocaine-prilocaine (EMLA) cream Apply to affected area once  . loperamide (IMODIUM) 2 MG capsule Take 1 capsule (2 mg total) by mouth 4 (four) times daily -  before meals and at bedtime.  . metoprolol tartrate (LOPRESSOR) 25 MG tablet Take 1 tablet (25 mg total) by mouth 2 (two) times daily.  . prochlorperazine (COMPAZINE) 10 MG tablet Take 1 tablet (10 mg total) by mouth every 6 (six) hours as needed (Nausea or vomiting).  . psyllium (HYDROCIL/METAMUCIL) 95 % PACK Take 1 packet by mouth daily.  . tamsulosin (FLOMAX) 0.4 MG CAPS capsule Take 0.4 mg by mouth at bedtime.   . vitamin C (ASCORBIC ACID) 500 MG tablet Take 1 tablet (500 mg total) by mouth daily.   Facility-Administered Encounter Medications as of 11/06/2018  Medication  . iothalamate meglumine (CYSTO-CONRAY II) 17.2 % solution 250 mL    ALLERGIES:  No Known Allergies   PHYSICAL EXAM:  ECOG Performance status: 1  Vitals:   11/06/18 0758  BP: 133/71  Pulse: 78  Resp: 18  Temp: 98.2 F (36.8 C)  SpO2: 100%   Filed Weights   11/06/18 0758  Weight: 188 lb 9.6 oz  (85.5 kg)    Physical Exam Constitutional:      Appearance: Normal appearance.  Cardiovascular:     Rate and Rhythm: Normal rate and regular rhythm.     Heart sounds: Normal heart sounds.  Pulmonary:     Effort: Pulmonary effort is normal.     Breath sounds: Normal breath sounds.  Abdominal:     Palpations: Abdomen is soft. There is no mass.     Tenderness: There is no abdominal tenderness.  Musculoskeletal:        General: No swelling.  Skin:    General: Skin is warm.  Neurological:     General: No focal deficit present.     Mental Status: He is alert and oriented to person, place, and time.  Psychiatric:        Mood and Affect: Mood normal.        Behavior: Behavior normal.      LABORATORY DATA:  I have reviewed the labs as listed.  CBC    Component Value Date/Time   WBC 3.5 (L) 11/06/2018 0747   RBC 3.89 (L) 11/06/2018 0747   HGB 11.5 (L) 11/06/2018 0747   HGB 11.8 (L) 09/14/2018 1514   HCT 35.7 (L) 11/06/2018 0747   HCT 38.4 09/14/2018 1514   PLT 115 (L) 11/06/2018 0747   PLT 468 (H) 09/14/2018 1514   MCV 91.8 11/06/2018 0747   MCV 87 09/14/2018 1514   MCH 29.6 11/06/2018 0747   MCHC 32.2 11/06/2018 0747   RDW 21.2 (H) 11/06/2018 0747   RDW 14.3 09/14/2018 1514   LYMPHSABS 0.9 11/06/2018 0747   LYMPHSABS 1.8 09/14/2018 1514   MONOABS 0.4 11/06/2018 0747   EOSABS 0.1 11/06/2018 0747   EOSABS 0.6 (H) 09/14/2018 1514   BASOSABS 0.0 11/06/2018 0747   BASOSABS 0.1 09/14/2018 1514   CMP Latest Ref Rng & Units 11/06/2018 10/20/2018 10/09/2018  Glucose 70 -  99 mg/dL 122(H) 163(H) 216(H)  BUN 8 - 23 mg/dL _0 Creatinine 0.61 - 1.24 mg/dL 0.95 0.99 0.85  Sodium 135 - 145 mmol/L 136 138 135  Potassium 3.5 - 5.1 mmol/L 3.8 4.1 4.5  Chloride 98 - 111 mmol/L 106 106 103  CO2 22 - 32 mmol/L _1 Calcium 8.9 - 10.3 mg/dL 8.8(L) 9.1 9.0  Total Protein 6.5 - 8.1 g/dL 7.1 7.1 7.2  Total Bilirubin 0.3 - 1.2 mg/dL 0.8 0.7 0.4  Alkaline Phos 38 - 126 U/L 106 123  120  AST 15 - 41 U/L 38 37 32  ALT 0 - 44 U/L _2 DIAGNOSTIC IMAGING:  I have independently reviewed the scans and discussed with the patient.   I have reviewed Venita Lick LPN's note and agree with the documentation.  I personally performed a face-to-face visit, made revisions and my assessment and plan is as follows.    ASSESSMENT & PLAN:   Cancer of transverse colon ([place]) 1.  Stage IIb (T4AN0) transverse colon adenocarcinoma, MSI-stable: - He had a history of colon cancer in June 2008, status post resection and did not require any adjuvant therapy. - He had a recurrence in February 2009, underwent resection with colostomy.  This was followed by 6 months of FOLFOX based chemotherapy sandwiching radiation. - He noticed blood in the colostomy bag.  He underwent colonoscopy on 08/25/2018 which showed malignant partially obstructing tumor in the mid transverse colon with active bleeding.  Other parts of the colon was normal.  Biopsy showed adenocarcinoma.  - Completion colectomy with end ileostomy, lysis of adhesions and bladder repair on 08/29/2018 by Dr. Constance Haw. - Pathology showed pT4a, PN0, 0/16 lymph nodes positive, MMR normal.  Margins were negative. -PET/CT scan on 09/28/2018 shows no findings of active malignancy.  There is some mild activity associated with left upper quadrant ascites and anterior abdominal wall wound sites, likely inflammatory but potentially meriting surveillance.  Liver appears unremarkable.  CEA was 2.0. - Because of his advanced age and pre-existing neuropathy, 6 months of adjuvant chemotherapy with 5-FU/LV was recommended.   -Adjuvant infusional 5-FU/leucovorin started on 10/09/2018. -Cycle 2 was on 10/23/2018. -Denies any diarrhea.  He is emptying bag 3-4 times since the end ileostomy.  He also takes 4-5 Imodium tablets daily since surgery. -He has noticed more numbness in the fingertips which is not affecting any of his activities. -I have  reviewed his blood work.  White count and platelets are adequate to proceed with his next cycle of treatment. -She will come back in 2 weeks for follow-up.  2.  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  This has improved over time.  He was treated with gabapentin in the past. - He has more numbness in the fingertips for the last 2 weeks.  It is not affecting any of his activities.     Total time spent is 25 minutes with more than 50% of the time spent face-to-face discussing side effects from chemotherapy and coordination of care.     Orders placed this encounter:  Orders Placed This Encounter  Procedures  . CBC with Differential/Platelet  . Comprehensive metabolic panel  . Magnesium      Jose Jack, MD [place] [phone]

## 2018-11-08 ENCOUNTER — Encounter (HOSPITAL_COMMUNITY): Payer: Self-pay

## 2018-11-08 ENCOUNTER — Other Ambulatory Visit: Payer: Self-pay

## 2018-11-08 ENCOUNTER — Inpatient Hospital Stay (HOSPITAL_COMMUNITY): Payer: Medicare Other | Attending: Hematology

## 2018-11-08 VITALS — BP 142/73 | HR 85 | Temp 97.6°F | Resp 18 | Wt 186.0 lb

## 2018-11-08 DIAGNOSIS — C184 Malignant neoplasm of transverse colon: Secondary | ICD-10-CM | POA: Diagnosis not present

## 2018-11-08 DIAGNOSIS — Z5111 Encounter for antineoplastic chemotherapy: Secondary | ICD-10-CM | POA: Insufficient documentation

## 2018-11-08 DIAGNOSIS — Z923 Personal history of irradiation: Secondary | ICD-10-CM | POA: Insufficient documentation

## 2018-11-08 DIAGNOSIS — G629 Polyneuropathy, unspecified: Secondary | ICD-10-CM | POA: Insufficient documentation

## 2018-11-08 DIAGNOSIS — Z9221 Personal history of antineoplastic chemotherapy: Secondary | ICD-10-CM | POA: Insufficient documentation

## 2018-11-08 DIAGNOSIS — Z79899 Other long term (current) drug therapy: Secondary | ICD-10-CM | POA: Insufficient documentation

## 2018-11-08 DIAGNOSIS — Z85038 Personal history of other malignant neoplasm of large intestine: Secondary | ICD-10-CM | POA: Insufficient documentation

## 2018-11-08 MED ORDER — HEPARIN SOD (PORK) LOCK FLUSH 100 UNIT/ML IV SOLN
500.0000 [IU] | Freq: Once | INTRAVENOUS | Status: AC | PRN
  Administered 2018-11-08: 500 [IU]

## 2018-11-08 MED ORDER — SODIUM CHLORIDE 0.9% FLUSH
10.0000 mL | INTRAVENOUS | Status: DC | PRN
  Administered 2018-11-08: 10 mL
  Filled 2018-11-08: qty 10

## 2018-11-08 NOTE — Patient Instructions (Signed)
[facility] at [facility]  Discharge Instructions:   _______________________________________________________________  Thank you for choosing [facility] at [facility] to provide your oncology and hematology care.  To afford each patient quality time with our providers, please arrive at least 15 minutes before your scheduled appointment.  You need to re-schedule your appointment if you arrive 10 or more minutes late.  We strive to give you quality time with our providers, and arriving late affects you and other patients whose appointments are after yours.  Also, if you no show three or more times for appointments you may be dismissed from the clinic.  Again, thank you for choosing [facility] at [facility]. Our hope is that these requests will allow you access to exceptional care and in a timely manner. _______________________________________________________________  If you have questions after your visit, please contact our office at [phone] between the hours of 8:30 a.m. and 5:00 p.m. Voicemails left after 4:30 p.m. will not be returned until the following business day. _______________________________________________________________  For prescription refill requests, have your pharmacy contact our office. _______________________________________________________________  Recommendations made by the consultant and any test results will be sent to your referring physician. _______________________________________________________________ 

## 2018-11-08 NOTE — Progress Notes (Signed)
Patients chemotherapy pump disconnected and port flushed with no complaints voiced.  Port site clean and dry with no bruising or swelling noted at site.  Band aid applied.  VSs with discharge and left ambulatory with no s/s of distress noted.

## 2018-11-11 ENCOUNTER — Telehealth: Payer: Self-pay | Admitting: General Surgery

## 2018-11-11 MED ORDER — LOPERAMIDE HCL 2 MG PO CAPS: 4.0000 mg | ORAL_CAPSULE | Freq: Three times a day (TID) | ORAL | 3 refills | Status: DC

## 2018-11-11 NOTE — Telephone Encounter (Signed)
[facility]  Refill sent for imodium. 4mg  QID.   Curlene Labrum, MD [facility] [address] [place],  [address] [phone] (office)

## 2018-11-20 ENCOUNTER — Encounter (HOSPITAL_COMMUNITY): Payer: Self-pay | Admitting: Hematology

## 2018-11-20 ENCOUNTER — Other Ambulatory Visit: Payer: Self-pay

## 2018-11-20 ENCOUNTER — Inpatient Hospital Stay (HOSPITAL_COMMUNITY): Payer: Medicare Other

## 2018-11-20 ENCOUNTER — Inpatient Hospital Stay (HOSPITAL_BASED_OUTPATIENT_CLINIC_OR_DEPARTMENT_OTHER): Payer: Medicare Other | Admitting: Hematology

## 2018-11-20 VITALS — BP 119/58 | HR 54 | Temp 97.4°F | Resp 18

## 2018-11-20 DIAGNOSIS — C184 Malignant neoplasm of transverse colon: Secondary | ICD-10-CM | POA: Diagnosis not present

## 2018-11-20 DIAGNOSIS — Z9221 Personal history of antineoplastic chemotherapy: Secondary | ICD-10-CM | POA: Diagnosis not present

## 2018-11-20 DIAGNOSIS — Z79899 Other long term (current) drug therapy: Secondary | ICD-10-CM

## 2018-11-20 DIAGNOSIS — Z923 Personal history of irradiation: Secondary | ICD-10-CM | POA: Diagnosis not present

## 2018-11-20 DIAGNOSIS — Z85038 Personal history of other malignant neoplasm of large intestine: Secondary | ICD-10-CM

## 2018-11-20 DIAGNOSIS — Z5111 Encounter for antineoplastic chemotherapy: Secondary | ICD-10-CM | POA: Diagnosis not present

## 2018-11-20 DIAGNOSIS — G629 Polyneuropathy, unspecified: Secondary | ICD-10-CM

## 2018-11-20 LAB — CBC WITH DIFFERENTIAL/PLATELET
Abs Immature Granulocytes: 0 10*3/uL (ref 0.00–0.07)
Basophils Absolute: 0 10*3/uL (ref 0.0–0.1)
Basophils Relative: 1 %
Eosinophils Absolute: 0.2 10*3/uL (ref 0.0–0.5)
Eosinophils Relative: 5 %
HCT: 35.3 % — ABNORMAL LOW (ref 39.0–52.0)
Hemoglobin: 11.7 g/dL — ABNORMAL LOW (ref 13.0–17.0)
Immature Granulocytes: 0 %
Lymphocytes Relative: 29 %
Lymphs Abs: 1.1 10*3/uL (ref 0.7–4.0)
MCH: 31.4 pg (ref 26.0–34.0)
MCHC: 33.1 g/dL (ref 30.0–36.0)
MCV: 94.6 fL (ref 80.0–100.0)
Monocytes Absolute: 0.5 10*3/uL (ref 0.1–1.0)
Monocytes Relative: 12 %
Neutro Abs: 2.2 10*3/uL (ref 1.7–7.7)
Neutrophils Relative %: 53 %
Platelets: 116 10*3/uL — ABNORMAL LOW (ref 150–400)
RBC: 3.73 MIL/uL — ABNORMAL LOW (ref 4.22–5.81)
RDW: 21.4 % — ABNORMAL HIGH (ref 11.5–15.5)
WBC: 4 10*3/uL (ref 4.0–10.5)
nRBC: 0 % (ref 0.0–0.2)

## 2018-11-20 LAB — COMPREHENSIVE METABOLIC PANEL WITH GFR
ALT: 26 U/L (ref 0–44)
AST: 38 U/L (ref 15–41)
Albumin: 3.4 g/dL — ABNORMAL LOW (ref 3.5–5.0)
Alkaline Phosphatase: 110 U/L (ref 38–126)
Anion gap: 7 (ref 5–15)
BUN: 12 mg/dL (ref 8–23)
CO2: 23 mmol/L (ref 22–32)
Calcium: 9.1 mg/dL (ref 8.9–10.3)
Chloride: 107 mmol/L (ref 98–111)
Creatinine, Ser: 1.03 mg/dL (ref 0.61–1.24)
GFR calc Af Amer: >60 mL/min
GFR calc non Af Amer: >60 mL/min
Glucose, Bld: 208 mg/dL — ABNORMAL HIGH (ref 70–99)
Potassium: 4.2 mmol/L (ref 3.5–5.1)
Sodium: 137 mmol/L (ref 135–145)
Total Bilirubin: 0.6 mg/dL (ref 0.3–1.2)
Total Protein: 6.7 g/dL (ref 6.5–8.1)

## 2018-11-20 LAB — MAGNESIUM: Magnesium: 2.2 mg/dL (ref 1.7–2.4)

## 2018-11-20 MED ORDER — OCTREOTIDE ACETATE 30 MG IM KIT
PACK | INTRAMUSCULAR | Status: AC
  Filled 2018-11-20: qty 1

## 2018-11-20 MED ORDER — SODIUM CHLORIDE 0.9 % IV SOLN
8.0000 mg | Freq: Once | INTRAVENOUS | Status: AC
  Administered 2018-11-20: 8 mg via INTRAVENOUS
  Filled 2018-11-20: qty 4

## 2018-11-20 MED ORDER — FLUOROURACIL CHEMO INJECTION 2.5 GM/50ML
400.0000 mg/m2 | Freq: Once | INTRAVENOUS | Status: AC
  Administered 2018-11-20: 11:00:00 800 mg via INTRAVENOUS
  Filled 2018-11-20: qty 16

## 2018-11-20 MED ORDER — SODIUM CHLORIDE 0.9 % IV SOLN
2430.0000 mg/m2 | INTRAVENOUS | Status: DC
  Administered 2018-11-20: 11:00:00 5000 mg via INTRAVENOUS
  Filled 2018-11-20: qty 100

## 2018-11-20 MED ORDER — LEUCOVORIN CALCIUM 500 MG/50ML IJ SOLN
400.0000 mg/m2 | Freq: Once | INTRAVENOUS | Status: AC
  Administered 2018-11-20: 10:00:00 820 mg via INTRAVENOUS
  Filled 2018-11-20: qty 250

## 2018-11-20 MED ORDER — SODIUM CHLORIDE 0.9 % IV SOLN
Freq: Once | INTRAVENOUS | Status: AC
  Administered 2018-11-20: 09:00:00 via INTRAVENOUS

## 2018-11-20 MED ORDER — SODIUM CHLORIDE 0.9% FLUSH
10.0000 mL | INTRAVENOUS | Status: DC | PRN
  Administered 2018-11-20: 10 mL
  Filled 2018-11-20: qty 10

## 2018-11-20 NOTE — Assessment & Plan Note (Signed)
1.  Stage IIb (T4AN0) transverse colon adenocarcinoma, MSI-stable: - He had a history of colon cancer in June 2008, status post resection and did not require any adjuvant therapy. - He had a recurrence in February 2009, underwent resection with colostomy.  This was followed by 6 months of FOLFOX based chemotherapy sandwiching radiation. - He noticed blood in the colostomy bag.  He underwent colonoscopy on 08/25/2018 which showed malignant partially obstructing tumor in the mid transverse colon with active bleeding.  Other parts of the colon was normal.  Biopsy showed adenocarcinoma.  - Completion colectomy with end ileostomy, lysis of adhesions and bladder repair on 08/29/2018 by Dr. Constance Haw. - Pathology showed pT4a, PN0, 0/16 lymph nodes positive, MMR normal.  Margins were negative. -PET/CT scan on 09/28/2018 shows no findings of active malignancy.  There is some mild activity associated with left upper quadrant ascites and anterior abdominal wall wound sites, likely inflammatory but potentially meriting surveillance.  Liver appears unremarkable.  CEA was 2.0. - Because of his advanced age and pre-existing neuropathy, 6 months of adjuvant chemotherapy with 5-FU/LV was recommended.   -Adjuvant chemotherapy with infusional 5-FU/leucovorin started on 10/09/2018. -Last cycle 3 was on 11/06/2018. -He has baseline diarrhea when he empties his bag 3-4 times since the end ileostomy.  He takes 4-5 Imodium tablets daily since surgery. -No worsening of diarrhea.  I have reviewed his blood work.  He may proceed with next cycle.  We will see him back in 2 weeks for follow-up.  2.  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  This is improved over time. -He was treated with gabapentin in the past.  He has more numbness in the fingertips then feet.  Not affecting any of his activities.

## 2018-11-20 NOTE — Progress Notes (Signed)
Jose Lawrence, [place] [address]   CLINIC:  Medical Oncology/Hematology  PCP:  Glenda Chroman, MD [place] [phone]   REASON FOR VISIT:  Follow-up for colon cancer treatment.   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[place])    09/29/2018 Cancer Staging    Staging form: Colon and Rectum, AJCC 8th Edition - Clinical stage from 09/29/2018: Stage IIB (cT4a, cN0, cM0) - Signed by Derek Jack, MD on 09/29/2018    10/09/2018 -  Chemotherapy    The patient had ondansetron (ZOFRAN) 8 mg in sodium chloride 0.9 % 50 mL IVPB, 8 mg (100 % of original dose 8 mg), Intravenous,  Once, 4 of 12 cycles Dose modification: 8 mg (original dose 8 mg, Cycle 1) Administration: 8 mg (10/09/2018), 8 mg (10/23/2018), 8 mg (11/06/2018), 8 mg (11/20/2018) leucovorin 800 mg in dextrose 5 % 250 mL infusion, 820 mg, Intravenous,  Once, 4 of 12 cycles Administration: 800 mg (10/09/2018), 820 mg (10/23/2018), 820 mg (11/06/2018), 820 mg (11/20/2018) fluorouracil (ADRUCIL) chemo injection 800 mg, 400 mg/m2 = 800 mg, Intravenous,  Once, 4 of 12 cycles Administration: 800 mg (10/09/2018), 800 mg (10/23/2018), 800 mg (11/06/2018), 800 mg (11/20/2018) fluorouracil (ADRUCIL) 4,900 mg in sodium chloride 0.9 % 52 mL chemo infusion, 2,400 mg/m2 = 4,900 mg, Intravenous, 1 Day/Dose, 4 of 12 cycles Administration: 4,900 mg (10/09/2018), 4,900 mg (10/23/2018), 5,000 mg (11/06/2018), 5,000 mg (11/20/2018)  for chemotherapy treatment.       CANCER STAGING: Cancer Staging Cancer of transverse colon [facility]) Staging form: Colon and Rectum, AJCC 8th Edition - Clinical stage from 09/29/2018: Stage IIB (cT4a, cN0, cM0) - Signed by Derek Jack, MD on 09/29/2018    INTERVAL HISTORY:  Jose Lawrence 78 y.o. male returns for routine follow-up and consideration for next cycle of chemotherapy. He is here  today alone. He states that his appetite seems a little better. He states that his numbness and tingling has gotten better as well.  Denies any nausea, vomiting, or diarrhea. Denies any new pains. Had not noticed any recent bleeding such as epistaxis, hematuria or hematochezia. Denies recent chest pain on exertion, shortness of breath on minimal exertion, pre-syncopal episodes, or palpitations. Denies any numbness or tingling in hands or feet. Denies any recent fevers, infections, or recent hospitalizations. Patient reports appetite at 100% and energy level at 0%.   REVIEW OF SYSTEMS:  Review of Systems  Respiratory: Positive for shortness of breath.      PAST MEDICAL/SURGICAL HISTORY:  Past Medical History:  Diagnosis Date  . Anemia    Causing interruption in anticoagulation  . Atrial fibrillation and flutter ([place])   . BPH (benign prostatic hypertrophy)   . Colon cancer ([place])    Status post LAR 2008  . Hypothyroidism   . Lacunar infarction ([place]) 11/2012   RT 3rd NERVE PALSY, SB [name]  . PE (pulmonary embolism)    Temporarily on Eliquis 2017  . Skin cancer    35 years ago  . Type 2 diabetes mellitus ([place])    Past Surgical History:  Procedure Laterality Date  . ABDOMINOPERINEAL PROCTOCOLECTOMY  2009   end colostomy   . BIOPSY  08/25/2018   Procedure: BIOPSY;  Surgeon: Rogene Houston, MD;  Location: [facility] ENDO SU[facility];  Service: Endoscopy;;  . BLADDER REPAIR  08/29/2018   Procedure: BLADDER  REPAIR;  Surgeon: [name], MD;  Location: [facility] ORS;  Service: General;;  . COLECTOMY  2008  . COLONOSCOPY WITH PROPOFOL N/A 08/25/2018   Procedure: COLONOSCOPY WITH PROPOFOL;  Surgeon: Rogene Houston, MD;  Location: [facility] ENDO SUITE;  Service: Endoscopy;  Laterality: N/A;  2:20  . COLOSTOMY    . EXPLORATORY LAPAROTOMY  2017   ? pneumoperitoneum of unknown etiology/ negative Ex lap  . ILEOSTOMY  08/29/2018   Procedure: ILEOSTOMY;  Surgeon: [name], MD;  Location: [facility] ORS;   Service: General;;  . LAPAROSCOPIC CHOLECYSTECTOMY  2007  . LYSIS OF ADHESION  08/29/2018   Procedure: LYSIS OF ADHESION;  Surgeon: [name], MD;  Location: [facility] ORS;  Service: General;;  . PARTIAL COLECTOMY N/A 08/29/2018   Procedure: PARTIAL COLECTOMY;  Surgeon: [name], MD;  Location: [facility] ORS;  Service: General;  Laterality: N/A;  . PORTACATH PLACEMENT Left 10/04/2018   Procedure: INSERTION PORT-A-CATH (attached catheter left subclavian);  Surgeon: [name], MD;  Location: [facility] ORS;  Service: General;  Laterality: Left;  . SBO SURGERY  06/2009     SOCIAL HISTORY:  Social History   Socioeconomic History  . Marital status: Married    Spouse name: Not on file  . Number of children: Not on file  . Years of education: Not on file  . Highest education level: Not on file  Occupational History  . Occupation: Architect    Comment: [employer]  Social Needs  . Financial resource strain: Not hard at all  . Food insecurity:    Worry: Never true    Inability: Never true  . Transportation needs:    Medical: No    Non-medical: No  Tobacco Use  . Smoking status: Former Smoker    Packs/day: 1.00    Years: 27.00    Pack years: 27.00    Types: Cigarettes    Start date: 01/27/1964    Last attempt to quit: 01/27/1991    Years since quitting: 27.8  . Smokeless tobacco: Former Systems developer    Types: [place] date: 08/10/1995  Substance and Sexual Activity  . Alcohol use: Never    Alcohol/week: 0.0 standard drinks    Frequency: Never  . Drug use: Never  . Sexual activity: Not Currently  Lifestyle  . Physical activity:    Days per week: 0 days    Minutes per session: 0 min  . Stress: Not at all  Relationships  . Social connections:    Talks on phone: Twice a week    Gets together: Once a week    Attends religious service: Not on file    Active member of club or organization: Not on file    Attends meetings of clubs or organizations: Not on file    Relationship  status: Not on file  . Intimate partner violence:    Fear of current or ex partner: No    Emotionally abused: No    Physically abused: No    Forced sexual activity: No  Other Topics Concern  . Not on file  Social History Narrative  . Not on file    FAMILY HISTORY:  Family History  Problem Relation Age of Onset  . Heart disease Father   . Heart failure Father   . Heart attack Father   . Prostate cancer Paternal Uncle     CURRENT MEDICATIONS:  Outpatient Encounter Medications as of 11/20/2018  Medication Sig  . aspirin 81 MG  tablet Take 81 mg by mouth daily.  . cetirizine (ZYRTEC) 10 MG tablet Take 10 mg by mouth daily.  . ferrous sulfate (FERROUSUL) 325 (65 FE) MG tablet Take 1 tablet (325 mg total) by mouth 2 (two) times daily with a meal.  . glipiZIDE (GLUCOTROL) 10 MG tablet Take 10 mg by mouth 2 (two) times daily.  Marland Kitchen levothyroxine (SYNTHROID, LEVOTHROID) 50 MCG tablet Take 50 mcg by mouth daily before breakfast.  . lidocaine-prilocaine (EMLA) cream Apply to affected area once  . loperamide (IMODIUM) 2 MG capsule Take 2 capsules (4 mg total) by mouth 4 (four) times daily -  before meals and at bedtime.  . metoprolol tartrate (LOPRESSOR) 25 MG tablet Take 1 tablet (25 mg total) by mouth 2 (two) times daily.  . prochlorperazine (COMPAZINE) 10 MG tablet Take 1 tablet (10 mg total) by mouth every 6 (six) hours as needed (Nausea or vomiting).  . psyllium (HYDROCIL/METAMUCIL) 95 % PACK Take 1 packet by mouth daily.  . tamsulosin (FLOMAX) 0.4 MG CAPS capsule Take 0.4 mg by mouth at bedtime.   . vitamin C (ASCORBIC ACID) 500 MG tablet Take 1 tablet (500 mg total) by mouth daily.   Facility-Administered Encounter Medications as of 11/20/2018  Medication  . iothalamate meglumine (CYSTO-CONRAY II) 17.2 % solution 250 mL    ALLERGIES:  No Known Allergies   PHYSICAL EXAM:  ECOG Performance status: 1  Vitals:   11/20/18 0812  BP: 121/62  Pulse: 63  Resp: 18  Temp: 97.9 F  (36.6 C)  SpO2: 100%   Filed Weights   11/20/18 0812  Weight: 187 lb 3.2 oz (84.9 kg)    Physical Exam Vitals signs reviewed.  Constitutional:      Appearance: Normal appearance.  Cardiovascular:     Rate and Rhythm: Normal rate and regular rhythm.     Heart sounds: Normal heart sounds.  Pulmonary:     Effort: Pulmonary effort is normal.     Breath sounds: Normal breath sounds.  Abdominal:     General: There is no distension.     Palpations: Abdomen is soft.     Tenderness: There is no abdominal tenderness.  Musculoskeletal:        General: No swelling.  Skin:    General: Skin is warm.  Neurological:     General: No focal deficit present.     Mental Status: He is alert and oriented to person, place, and time.  Psychiatric:        Mood and Affect: Mood normal.        Behavior: Behavior normal.      LABORATORY DATA:  I have reviewed the labs as listed.  CBC    Component Value Date/Time   WBC 4.0 11/20/2018 0802   RBC 3.73 (L) 11/20/2018 0802   HGB 11.7 (L) 11/20/2018 0802   HGB 11.8 (L) 09/14/2018 1514   HCT 35.3 (L) 11/20/2018 0802   HCT 38.4 09/14/2018 1514   PLT 116 (L) 11/20/2018 0802   PLT 468 (H) 09/14/2018 1514   MCV 94.6 11/20/2018 0802   MCV 87 09/14/2018 1514   MCH 31.4 11/20/2018 0802   MCHC 33.1 11/20/2018 0802   RDW 21.4 (H) 11/20/2018 0802   RDW 14.3 09/14/2018 1514   LYMPHSABS 1.1 11/20/2018 0802   LYMPHSABS 1.8 09/14/2018 1514   MONOABS 0.5 11/20/2018 0802   EOSABS 0.2 11/20/2018 0802   EOSABS 0.6 (H) 09/14/2018 1514   BASOSABS 0.0 11/20/2018 0802   BASOSABS 0.1  09/14/2018 1514   CMP Latest Ref Rng & Units 11/20/2018 11/06/2018 10/20/2018  Glucose 70 - 99 mg/dL 208(H) 122(H) 163(H)  BUN 8 - 23 mg/dL '12 10 8  '$ Creatinine 0.61 - 1.24 mg/dL 1.03 0.95 0.99  Sodium 135 - 145 mmol/L 137 136 138  Potassium 3.5 - 5.1 mmol/L 4.2 3.8 4.1  Chloride 98 - 111 mmol/L 107 106 106  CO2 22 - 32 mmol/L '23 23 25  '$ Calcium 8.9 - 10.3 mg/dL 9.1 8.8(L) 9.1   Total Protein 6.5 - 8.1 g/dL 6.7 7.1 7.1  Total Bilirubin 0.3 - 1.2 mg/dL 0.6 0.8 0.7  Alkaline Phos 38 - 126 U/L 110 106 123  AST 15 - 41 U/L 38 38 37  ALT 0 - 44 U/L '26 23 23       '$ DIAGNOSTIC IMAGING:  I have independently reviewed the scans and discussed with the patient.   I have reviewed Venita Lick LPN's note and agree with the documentation.  I personally performed a face-to-face visit, made revisions and my assessment and plan is as follows.    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  Stage IIb (T4AN0) transverse colon adenocarcinoma, MSI-stable: - He had a history of colon cancer in June 2008, status post resection and did not require any adjuvant therapy. - He had a recurrence in February 2009, underwent resection with colostomy.  This was followed by 6 months of FOLFOX based chemotherapy sandwiching radiation. - He noticed blood in the colostomy bag.  He underwent colonoscopy on 08/25/2018 which showed malignant partially obstructing tumor in the mid transverse colon with active bleeding.  Other parts of the colon was normal.  Biopsy showed adenocarcinoma.  - Completion colectomy with end ileostomy, lysis of adhesions and bladder repair on 08/29/2018 by Dr. Constance Haw. - Pathology showed pT4a, PN0, 0/16 lymph nodes positive, MMR normal.  Margins were negative. -PET/CT scan on 09/28/2018 shows no findings of active malignancy.  There is some mild activity associated with left upper quadrant ascites and anterior abdominal wall wound sites, likely inflammatory but potentially meriting surveillance.  Liver appears unremarkable.  CEA was 2.0. - Because of his advanced age and pre-existing neuropathy, 6 months of adjuvant chemotherapy with 5-FU/LV was recommended.   -Adjuvant chemotherapy with infusional 5-FU/leucovorin started on 10/09/2018. -Last cycle 3 was on 11/06/2018. -He has baseline diarrhea when he empties his bag 3-4 times since the end ileostomy.  He takes  4-5 Imodium tablets daily since surgery. -No worsening of diarrhea.  I have reviewed his blood work.  He may proceed with next cycle.  We will see him back in 2 weeks for follow-up.  2.  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  This is improved over time. -He was treated with gabapentin in the past.  He has more numbness in the fingertips then feet.  Not affecting any of his activities.       Total time spent is 25 minutes with more than 50% of the time spent face-to-face discussing and reinforcing treatment plan, side effects and coordination of care.    Orders placed this encounter:  No orders of the defined types were placed in this encounter.     Derek Jack, MD [place] [phone]

## 2018-11-20 NOTE — Patient Instructions (Signed)
[facility] Discharge Instructions for Patients Receiving Chemotherapy   Beginning January 23rd 2017 lab work for the Cancer Center will be done in the  Main lab at [facility] on 1st floor. If you have a lab appointment with the Cancer Center please come in thru the  Main Entrance and check in at the main information desk   Today you received the following chemotherapy agents Leucovorin and 5FU. Follow-up as scheduled. Call clinic for any questions or concerns  To help prevent nausea and vomiting after your treatment, we encourage you to take your nausea medication   If you develop nausea and vomiting, or diarrhea that is not controlled by your medication, call the clinic.  The clinic phone number is [phone]. Office hours are Monday-Friday 8:30am-5:00pm.  BELOW ARE SYMPTOMS THAT SHOULD BE REPORTED IMMEDIATELY:  *FEVER GREATER THAN 101.0 F  *CHILLS WITH OR WITHOUT FEVER  NAUSEA AND VOMITING THAT IS NOT CONTROLLED WITH YOUR NAUSEA MEDICATION  *UNUSUAL SHORTNESS OF BREATH  *UNUSUAL BRUISING OR BLEEDING  TENDERNESS IN MOUTH AND THROAT WITH OR WITHOUT PRESENCE OF ULCERS  *URINARY PROBLEMS  *BOWEL PROBLEMS  UNUSUAL RASH Items with * indicate a potential emergency and should be followed up as soon as possible. If you have an emergency after office hours please contact your primary care physician or go to the nearest emergency department.  Please call the clinic during office hours if you have any questions or concerns.   You may also contact the Patient Navigator at [phone] should you have any questions or need assistance in obtaining follow up care.      Resources For Cancer Patients and their Caregivers ? American Cancer Society: Can assist with transportation, wigs, general needs, runs Look Good Feel Better.        1-888-227-6333 ? Cancer Care: Provides financial assistance, online support groups, medication/co-pay assistance.   1-800-813-HOPE (4673) ? [facility] Assists [place] cancer patients and their families through emotional , educational and financial support.  336-427-4357 ? [place] DSS Where to apply for food stamps, Medicaid and utility assistance. 336-342-1394 ? RCATS: Transportation to medical appointments. 336-347-2287 ? Social Security Administration: May apply for disability if have a Stage IV cancer. 336-342-7796 1-800-772-1213 ? [place] Aging, Disability and Transit Services: Assists with nutrition, care and transit needs. 336-349-2343         

## 2018-11-20 NOTE — Progress Notes (Signed)
0900 Labs reviewed with and pt seen by Dr. Delton Coombes and pt approved for chemo tx today per MD                                                                                     Army Chaco tolerated chemotherapy well without complaints or incident. Pt discharged with 5FU pump infusing without issues. VSS upon discharge. Pt discharged self ambulatory in satisfactory condition

## 2018-11-20 NOTE — Patient Instructions (Addendum)
[facility] at [facility] Discharge Instructions  You were seen today by Dr. Katragadda. He went over your recent lab results. He will see you back in 2 weeks for labs, treatment and follow up.   Thank you for choosing [facility] at [facility] to provide your oncology and hematology care.  To afford each patient quality time with our provider, please arrive at least 15 minutes before your scheduled appointment time.   If you have a lab appointment with the Cancer Center please come in thru the  Main Entrance and check in at the main information desk  You need to re-schedule your appointment should you arrive 10 or more minutes late.  We strive to give you quality time with our providers, and arriving late affects you and other patients whose appointments are after yours.  Also, if you no show three or more times for appointments you may be dismissed from the clinic at the providers discretion.     Again, thank you for choosing [facility].  Our hope is that these requests will decrease the amount of time that you wait before being seen by our physicians.       _____________________________________________________________  Should you have questions after your visit to [facility], please contact our office at [phone] between the hours of 8:00 a.m. and 4:30 p.m.  Voicemails left after 4:00 p.m. will not be returned until the following business day.  For prescription refill requests, have your pharmacy contact our office and allow 72 hours.    Cancer Center Support Programs:   > Cancer Support Group  2nd Tuesday of the month 1pm-2pm, Journey Room    

## 2018-11-20 NOTE — Patient Instructions (Signed)
[facility] at [facility] Discharge Instructions  Labs drawn from portacath today   Thank you for choosing [facility] at [facility] to provide your oncology and hematology care.  To afford each patient quality time with our provider, please arrive at least 15 minutes before your scheduled appointment time.   If you have a lab appointment with the Cancer Center please come in thru the  Main Entrance and check in at the main information desk  You need to re-schedule your appointment should you arrive 10 or more minutes late.  We strive to give you quality time with our providers, and arriving late affects you and other patients whose appointments are after yours.  Also, if you no show three or more times for appointments you may be dismissed from the clinic at the providers discretion.     Again, thank you for choosing [facility].  Our hope is that these requests will decrease the amount of time that you wait before being seen by our physicians.       _____________________________________________________________  Should you have questions after your visit to [facility], please contact our office at [phone] between the hours of 8:00 a.m. and 4:30 p.m.  Voicemails left after 4:00 p.m. will not be returned until the following business day.  For prescription refill requests, have your pharmacy contact our office and allow 72 hours.    Cancer Center Support Programs:   > Cancer Support Group  2nd Tuesday of the month 1pm-2pm, Journey Room   

## 2018-11-22 ENCOUNTER — Other Ambulatory Visit: Payer: Self-pay

## 2018-11-22 ENCOUNTER — Inpatient Hospital Stay (HOSPITAL_COMMUNITY): Payer: Medicare Other

## 2018-11-22 ENCOUNTER — Encounter (HOSPITAL_COMMUNITY): Payer: Self-pay

## 2018-11-22 VITALS — BP 125/65 | HR 55 | Temp 97.4°F | Resp 18

## 2018-11-22 DIAGNOSIS — G629 Polyneuropathy, unspecified: Secondary | ICD-10-CM | POA: Diagnosis not present

## 2018-11-22 DIAGNOSIS — Z5111 Encounter for antineoplastic chemotherapy: Secondary | ICD-10-CM | POA: Diagnosis not present

## 2018-11-22 DIAGNOSIS — C184 Malignant neoplasm of transverse colon: Secondary | ICD-10-CM | POA: Diagnosis not present

## 2018-11-22 DIAGNOSIS — Z9221 Personal history of antineoplastic chemotherapy: Secondary | ICD-10-CM | POA: Diagnosis not present

## 2018-11-22 DIAGNOSIS — Z923 Personal history of irradiation: Secondary | ICD-10-CM | POA: Diagnosis not present

## 2018-11-22 DIAGNOSIS — Z85038 Personal history of other malignant neoplasm of large intestine: Secondary | ICD-10-CM | POA: Diagnosis not present

## 2018-11-22 MED ORDER — HEPARIN SOD (PORK) LOCK FLUSH 100 UNIT/ML IV SOLN
500.0000 [IU] | Freq: Once | INTRAVENOUS | Status: AC | PRN
  Administered 2018-11-22: 12:00:00 500 [IU]

## 2018-11-22 MED ORDER — SODIUM CHLORIDE 0.9% FLUSH
10.0000 mL | INTRAVENOUS | Status: DC | PRN
  Administered 2018-11-22: 10 mL
  Filled 2018-11-22: qty 10

## 2018-11-22 NOTE — Progress Notes (Signed)
Patients chemotherapy pump disconnected with no complaints of pain.  Flushed easily.  Good blood return noted.  Band aid applied.  VSS with discharge and left ambulatory with no s/s of distress noted.

## 2018-11-29 DIAGNOSIS — Z6826 Body mass index (BMI) 26.0-26.9, adult: Secondary | ICD-10-CM | POA: Diagnosis not present

## 2018-11-29 DIAGNOSIS — C189 Malignant neoplasm of colon, unspecified: Secondary | ICD-10-CM | POA: Diagnosis not present

## 2018-11-29 DIAGNOSIS — E1165 Type 2 diabetes mellitus with hyperglycemia: Secondary | ICD-10-CM | POA: Diagnosis not present

## 2018-11-29 DIAGNOSIS — I10 Essential (primary) hypertension: Secondary | ICD-10-CM | POA: Diagnosis not present

## 2018-11-29 DIAGNOSIS — I4891 Unspecified atrial fibrillation: Secondary | ICD-10-CM | POA: Diagnosis not present

## 2018-11-29 DIAGNOSIS — I2699 Other pulmonary embolism without acute cor pulmonale: Secondary | ICD-10-CM | POA: Diagnosis not present

## 2018-11-29 DIAGNOSIS — Z299 Encounter for prophylactic measures, unspecified: Secondary | ICD-10-CM | POA: Diagnosis not present

## 2018-11-30 ENCOUNTER — Other Ambulatory Visit: Payer: Self-pay | Admitting: General Surgery

## 2018-12-04 ENCOUNTER — Inpatient Hospital Stay (HOSPITAL_BASED_OUTPATIENT_CLINIC_OR_DEPARTMENT_OTHER): Payer: Medicare Other | Admitting: Hematology

## 2018-12-04 ENCOUNTER — Encounter (HOSPITAL_COMMUNITY): Payer: Self-pay | Admitting: Hematology

## 2018-12-04 ENCOUNTER — Inpatient Hospital Stay (HOSPITAL_COMMUNITY): Payer: Medicare Other

## 2018-12-04 ENCOUNTER — Other Ambulatory Visit: Payer: Self-pay

## 2018-12-04 VITALS — BP 124/70 | HR 58 | Temp 97.4°F | Resp 18 | Wt 188.0 lb

## 2018-12-04 DIAGNOSIS — Z85038 Personal history of other malignant neoplasm of large intestine: Secondary | ICD-10-CM

## 2018-12-04 DIAGNOSIS — Z923 Personal history of irradiation: Secondary | ICD-10-CM

## 2018-12-04 DIAGNOSIS — Z5111 Encounter for antineoplastic chemotherapy: Secondary | ICD-10-CM | POA: Diagnosis not present

## 2018-12-04 DIAGNOSIS — Z79899 Other long term (current) drug therapy: Secondary | ICD-10-CM

## 2018-12-04 DIAGNOSIS — G629 Polyneuropathy, unspecified: Secondary | ICD-10-CM | POA: Diagnosis not present

## 2018-12-04 DIAGNOSIS — C184 Malignant neoplasm of transverse colon: Secondary | ICD-10-CM

## 2018-12-04 DIAGNOSIS — Z9221 Personal history of antineoplastic chemotherapy: Secondary | ICD-10-CM | POA: Diagnosis not present

## 2018-12-04 LAB — COMPREHENSIVE METABOLIC PANEL
ALT: 26 U/L (ref 0–44)
AST: 33 U/L (ref 15–41)
Albumin: 3.5 g/dL (ref 3.5–5.0)
Alkaline Phosphatase: 105 U/L (ref 38–126)
Anion gap: 7 (ref 5–15)
BUN: 11 mg/dL (ref 8–23)
CO2: 25 mmol/L (ref 22–32)
Calcium: 9.1 mg/dL (ref 8.9–10.3)
Chloride: 104 mmol/L (ref 98–111)
Creatinine, Ser: 0.93 mg/dL (ref 0.61–1.24)
GFR calc Af Amer: >60 mL/min (ref >60)
GFR calc non Af Amer: >60 mL/min (ref >60)
Glucose, Bld: 191 mg/dL — ABNORMAL HIGH (ref 70–99)
Potassium: 4 mmol/L (ref 3.5–5.1)
Sodium: 136 mmol/L (ref 135–145)
Total Bilirubin: 0.6 mg/dL (ref 0.3–1.2)
Total Protein: 6.6 g/dL (ref 6.5–8.1)

## 2018-12-04 LAB — CBC WITH DIFFERENTIAL/PLATELET
Abs Immature Granulocytes: 0.01 10*3/uL (ref 0.00–0.07)
Basophils Absolute: 0.1 10*3/uL (ref 0.0–0.1)
Basophils Relative: 2 %
Eosinophils Absolute: 0.2 10*3/uL (ref 0.0–0.5)
Eosinophils Relative: 7 %
HCT: 35.7 % — ABNORMAL LOW (ref 39.0–52.0)
Hemoglobin: 12 g/dL — ABNORMAL LOW (ref 13.0–17.0)
Immature Granulocytes: 0 %
Lymphocytes Relative: 30 %
Lymphs Abs: 1.1 10*3/uL (ref 0.7–4.0)
MCH: 32.3 pg (ref 26.0–34.0)
MCHC: 33.6 g/dL (ref 30.0–36.0)
MCV: 96.2 fL (ref 80.0–100.0)
Monocytes Absolute: 0.5 10*3/uL (ref 0.1–1.0)
Monocytes Relative: 14 %
Neutro Abs: 1.8 10*3/uL (ref 1.7–7.7)
Neutrophils Relative %: 47 %
Platelets: 104 10*3/uL — ABNORMAL LOW (ref 150–400)
RBC: 3.71 MIL/uL — ABNORMAL LOW (ref 4.22–5.81)
RDW: 21.4 % — ABNORMAL HIGH (ref 11.5–15.5)
WBC: 3.7 10*3/uL — ABNORMAL LOW (ref 4.0–10.5)
nRBC: 0 % (ref 0.0–0.2)

## 2018-12-04 MED ORDER — LEUCOVORIN CALCIUM INJECTION 350 MG
400.0000 mg/m2 | Freq: Once | INTRAVENOUS | Status: AC
  Administered 2018-12-04: 10:00:00 820 mg via INTRAVENOUS
  Filled 2018-12-04: qty 41

## 2018-12-04 MED ORDER — SODIUM CHLORIDE 0.9 % IV SOLN
8.0000 mg | Freq: Once | INTRAVENOUS | Status: AC
  Administered 2018-12-04: 09:00:00 8 mg via INTRAVENOUS
  Filled 2018-12-04: qty 4

## 2018-12-04 MED ORDER — SODIUM CHLORIDE 0.9 % IV SOLN
Freq: Once | INTRAVENOUS | Status: AC
  Administered 2018-12-04: 09:00:00 via INTRAVENOUS

## 2018-12-04 MED ORDER — SODIUM CHLORIDE 0.9% FLUSH
10.0000 mL | INTRAVENOUS | Status: DC | PRN
  Administered 2018-12-04: 08:00:00 10 mL
  Filled 2018-12-04: qty 10

## 2018-12-04 MED ORDER — SODIUM CHLORIDE 0.9 % IV SOLN
2430.0000 mg/m2 | INTRAVENOUS | Status: DC
  Administered 2018-12-04: 5000 mg via INTRAVENOUS
  Filled 2018-12-04: qty 100

## 2018-12-04 MED ORDER — FLUOROURACIL CHEMO INJECTION 2.5 GM/50ML
400.0000 mg/m2 | Freq: Once | INTRAVENOUS | Status: AC
  Administered 2018-12-04: 10:00:00 800 mg via INTRAVENOUS
  Filled 2018-12-04: qty 16

## 2018-12-04 NOTE — Progress Notes (Signed)
Jose Lawrence reviewed with and pt seen by Dr. Delton Coombes and pt approved for chemo tx today per MD                                                                                     Jose Lawrence tolerated chemo tx well without complaints or incident. VSS upon discharge. Pt discharged with 5FU pump infusing without issues. Pt discharged self ambulatory in satisfactory ocndition

## 2018-12-04 NOTE — Patient Instructions (Signed)
[facility] Discharge Instructions for Patients Receiving Chemotherapy   Beginning January 23rd 2017 lab work for the Cancer Center will be done in the  Main lab at [facility] on 1st floor. If you have a lab appointment with the Cancer Center please come in thru the  Main Entrance and check in at the main information desk   Today you received the following chemotherapy agents Leucovorin and 5FU. Follow-up as scheduled. Call clinic for any questions or concerns  To help prevent nausea and vomiting after your treatment, we encourage you to take your nausea medication   If you develop nausea and vomiting, or diarrhea that is not controlled by your medication, call the clinic.  The clinic phone number is [phone]. Office hours are Monday-Friday 8:30am-5:00pm.  BELOW ARE SYMPTOMS THAT SHOULD BE REPORTED IMMEDIATELY:  *FEVER GREATER THAN 101.0 F  *CHILLS WITH OR WITHOUT FEVER  NAUSEA AND VOMITING THAT IS NOT CONTROLLED WITH YOUR NAUSEA MEDICATION  *UNUSUAL SHORTNESS OF BREATH  *UNUSUAL BRUISING OR BLEEDING  TENDERNESS IN MOUTH AND THROAT WITH OR WITHOUT PRESENCE OF ULCERS  *URINARY PROBLEMS  *BOWEL PROBLEMS  UNUSUAL RASH Items with * indicate a potential emergency and should be followed up as soon as possible. If you have an emergency after office hours please contact your primary care physician or go to the nearest emergency department.  Please call the clinic during office hours if you have any questions or concerns.   You may also contact the Patient Navigator at [phone] should you have any questions or need assistance in obtaining follow up care.      Resources For Cancer Patients and their Caregivers ? American Cancer Society: Can assist with transportation, wigs, general needs, runs Look Good Feel Better.        1-888-227-6333 ? Cancer Care: Provides financial assistance, online support groups, medication/co-pay assistance.   1-800-813-HOPE (4673) ? [facility] Assists [place] cancer patients and their families through emotional , educational and financial support.  336-427-4357 ? [place] DSS Where to apply for food stamps, Medicaid and utility assistance. 336-342-1394 ? RCATS: Transportation to medical appointments. 336-347-2287 ? Social Security Administration: May apply for disability if have a Stage IV cancer. 336-342-7796 1-800-772-1213 ? [place] Aging, Disability and Transit Services: Assists with nutrition, care and transit needs. 336-349-2343         

## 2018-12-04 NOTE — Assessment & Plan Note (Signed)
1.  Stage IIb (T4AN0) transverse colon adenocarcinoma, MSI-stable: - He had a history of colon cancer in June 2008, status post resection and did not require any adjuvant therapy. - He had a recurrence in February 2009, underwent resection with colostomy.  This was followed by 6 months of FOLFOX based chemotherapy sandwiching radiation. - He noticed blood in the colostomy bag.  He underwent colonoscopy on 08/25/2018 which showed malignant partially obstructing tumor in the mid transverse colon with active bleeding.  Other parts of the colon was normal.  Biopsy showed adenocarcinoma.  - Completion colectomy with end ileostomy, lysis of adhesions and bladder repair on 08/29/2018 by Dr. Constance Haw. - Pathology showed pT4a, PN0, 0/16 lymph nodes positive, MMR normal.  Margins were negative. -PET/CT scan on 09/28/2018 shows no findings of active malignancy.  There is some mild activity associated with left upper quadrant ascites and anterior abdominal wall wound sites, likely inflammatory but potentially meriting surveillance.  Liver appears unremarkable.  CEA was 2.0. - Because of his advanced age and pre-existing neuropathy, 6 months of adjuvant chemotherapy with 5-FU/LV was recommended.   -Adjuvant chemotherapy with infusional 5-FU/leucovorin started on 10/09/2018. -Cycle 4 chemotherapy on 11/20/2018. -He has baseline diarrhea and empties his bag for 3-4 times a day.  He takes 4 Imodium tablets daily since surgery. -He has noticed slight tiredness after the pump is discontinued.  No worsening of diarrhea reported. -I have reviewed his blood work.  He may proceed with his next cycle today. -We will reevaluate him in 2 weeks.   2.  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 -He reports slight worsening of numbness in the fingertips in the last 2 weeks.  He was treated with gabapentin in the past.

## 2018-12-04 NOTE — Progress Notes (Signed)
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[place])    09/29/2018 Cancer Staging    Staging form: Colon and Rectum, AJCC 8th Edition - Clinical stage from 09/29/2018: Stage IIB (cT4a, cN0, cM0) - Signed by Derek Jack, MD on 09/29/2018    10/09/2018 -  Chemotherapy    The patient had ondansetron (ZOFRAN) 8 mg in sodium chloride 0.9 % 50 mL IVPB, 8 mg (100 % of original dose 8 mg), Intravenous,  Once, 5 of 12 cycles Dose modification: 8 mg (original dose 8 mg, Cycle 1) Administration: 8 mg (10/09/2018), 8 mg (10/23/2018), 8 mg (11/06/2018), 8 mg (11/20/2018), 8 mg (12/04/2018) leucovorin 800 mg in dextrose 5 % 250 mL infusion, 820 mg, Intravenous,  Once, 5 of 12 cycles Administration: 800 mg (10/09/2018), 820 mg (10/23/2018), 820 mg (11/06/2018), 820 mg (11/20/2018), 820 mg (12/04/2018) fluorouracil (ADRUCIL) chemo injection 800 mg, 400 mg/m2 = 800 mg, Intravenous,  Once, 5 of 12 cycles Administration: 800 mg (10/09/2018), 800 mg (10/23/2018), 800 mg (11/06/2018), 800 mg (11/20/2018), 800 mg (12/04/2018) fluorouracil (ADRUCIL) 4,900 mg in sodium chloride 0.9 % 52 mL chemo infusion, 2,400 mg/m2 = 4,900 mg, Intravenous, 1 Day/Dose, 5 of 12 cycles Administration: 4,900 mg (10/09/2018), 4,900 mg (10/23/2018), 5,000 mg (11/06/2018), 5,000 mg (11/20/2018), 5,000 mg (12/04/2018)  for chemotherapy treatment.       CANCER STAGING: Cancer Staging Cancer of transverse colon [facility]) Staging form: Colon and Rectum, AJCC 8th Edition - Clinical stage from 09/29/2018: Stage IIB (cT4a, cN0, cM0) - Signed by Derek Jack, MD on 09/29/2018    INTERVAL HISTORY:  Jose Lawrence 78 y.o. male returns for routine  follow-up. He is here today alone. He states that he did well with is last treatment, he is tired for the first 4 days and then that gets better. Denies any nausea, vomiting, or diarrhea. Denies any new pains. Had not noticed any recent bleeding such as epistaxis, hematuria or hematochezia. Denies recent chest pain on exertion, shortness of breath on minimal exertion, pre-syncopal episodes, or palpitations. Denies any numbness or tingling in hands or feet. Denies any recent fevers, infections, or recent hospitalizations. Patient reports appetite at 100% and energy level at 75%.      REVIEW OF SYSTEMS:  Review of Systems  Skin: Positive for itching.  Neurological: Positive for numbness.     PAST MEDICAL/SURGICAL HISTORY:  Past Medical History:  Diagnosis Date  . Anemia    Causing interruption in anticoagulation  . Atrial fibrillation and flutter ([place])   . BPH (benign prostatic hypertrophy)   . Colon cancer ([place])    Status post LAR 2008  . Hypothyroidism   . Lacunar infarction ([place]) 11/2012   RT 3rd NERVE PALSY, SB [name]  . PE (pulmonary embolism)    Temporarily on Eliquis 2017  . Skin cancer    35 years ago  . Type 2 diabetes mellitus ([place])    Past Surgical History:  Procedure Laterality Date  . ABDOMINOPERINEAL PROCTOCOLECTOMY  2009   end colostomy   . BIOPSY  08/25/2018   Procedure: BIOPSY;  Surgeon: Rogene Houston, MD;  Location: [facility] END[facility];  Service:  Endoscopy;;  . BLADDER REPAIR  08/29/2018   Procedure: BLADDER REPAIR;  Surgeon: [name], MD;  Location: [facility] ORS;  Service: General;;  . COLECTOMY  2008  . COLONOSCOPY WITH PROPOFOL N/A 08/25/2018   Procedure: COLONOSCOPY WITH PROPOFOL;  Surgeon: Rogene Houston, MD;  Location: [facility] END[facility];  Service: Endoscopy;  Laterality: N/A;  2:20  . COLOSTOMY    . EXPLORATORY LAPAROTOMY  2017   ? pneumoperitoneum of unknown etiology/ negative Ex lap  . ILEOSTOMY  08/29/2018   Procedure: ILEOSTOMY;  Surgeon: [name], MD;  Location: [facility] ORS;  Service: General;;  . LAPAROSCOPIC CHOLECYSTECTOMY  2007  . LYSIS OF ADHESION  08/29/2018   Procedure: LYSIS OF ADHESION;  Surgeon: [name], MD;  Location: [facility] ORS;  Service: General;;  . PARTIAL COLECTOMY N/A 08/29/2018   Procedure: PARTIAL COLECTOMY;  Surgeon: [name], MD;  Location: [facility] ORS;  Service: General;  Laterality: N/A;  . PORTACATH PLACEMENT Left 10/04/2018   Procedure: INSERTION PORT-A-CATH (attached catheter left subclavian);  Surgeon: [name], MD;  Location: [facility] ORS;  Service: General;  Laterality: Left;  . SBO SURGERY  06/2009     SOCIAL HISTORY:  Social History   Socioeconomic History  . Marital status: Married    Spouse name: Not on file  . Number of children: Not on file  . Years of education: Not on file  . Highest education level: Not on file  Occupational History  . Occupation: Architect    Comment: [employer]  Social Needs  . Financial resource strain: Not hard at all  . Food insecurity:    Worry: Never true    Inability: Never true  . Transportation needs:    Medical: No    Non-medical: No  Tobacco Use  . Smoking status: Former Smoker    Packs/day: 1.00    Years: 27.00    Pack years: 27.00    Types: Cigarettes    Start date: 01/27/1964    Last attempt to quit: 01/27/1991    Years since quitting: 27.8  . Smokeless tobacco: Former Systems developer    Types: [place] date: 08/10/1995  Substance and Sexual Activity  . Alcohol use: Never    Alcohol/week: 0.0 standard drinks    Frequency: Never  . Drug use: Never  . Sexual activity: Not Currently  Lifestyle  . Physical activity:    Days per week: 0 days    Minutes per session: 0 min  . Stress: Not at all  Relationships  . Social connections:    Talks on phone: Twice a week    Gets together: Once a week    Attends religious service: Not on file    Active member of club or organization: Not on file    Attends meetings of clubs or  organizations: Not on file    Relationship status: Not on file  . Intimate partner violence:    Fear of current or ex partner: No    Emotionally abused: No    Physically abused: No    Forced sexual activity: No  Other Topics Concern  . Not on file  Social History Narrative  . Not on file    FAMILY HISTORY:  Family History  Problem Relation Age of Onset  . Heart disease Father   . Heart failure Father   . Heart attack Father   . Prostate cancer Paternal Uncle     CURRENT MEDICATIONS:  Outpatient Encounter Medications  as of 12/04/2018  Medication Sig  . aspirin 81 MG tablet Take 81 mg by mouth daily.  . cetirizine (ZYRTEC) 10 MG tablet Take 10 mg by mouth daily.  . ferrous sulfate (FERROUSUL) 325 (65 FE) MG tablet Take 1 tablet (325 mg total) by mouth 2 (two) times daily with a meal.  . glipiZIDE (GLUCOTROL) 10 MG tablet Take 10 mg by mouth 2 (two) times daily.  Marland Kitchen levothyroxine (SYNTHROID, LEVOTHROID) 50 MCG tablet Take 50 mcg by mouth daily before breakfast.  . lidocaine-prilocaine (EMLA) cream Apply to affected area once  . loperamide (IMODIUM) 2 MG capsule Take 2 capsules (4 mg total) by mouth 4 (four) times daily -  before meals and at bedtime.  . metoprolol tartrate (LOPRESSOR) 25 MG tablet Take 1 tablet (25 mg total) by mouth 2 (two) times daily.  . prochlorperazine (COMPAZINE) 10 MG tablet Take 1 tablet (10 mg total) by mouth every 6 (six) hours as needed (Nausea or vomiting).  . psyllium (HYDROCIL/METAMUCIL) 95 % PACK Take 1 packet by mouth daily.  . tamsulosin (FLOMAX) 0.4 MG CAPS capsule Take 0.4 mg by mouth at bedtime.   . vitamin C (ASCORBIC ACID) 500 MG tablet Take 1 tablet (500 mg total) by mouth daily.   Facility-Administered Encounter Medications as of 12/04/2018  Medication  . iothalamate meglumine (CYSTO-CONRAY II) 17.2 % solution 250 mL    ALLERGIES:  No Known Allergies   PHYSICAL EXAM:  ECOG Performance status: 1  Vitals:   12/04/18 0753  BP: 113/67   Pulse: (!) 57  Resp: 18  Temp: 97.6 F (36.4 C)  SpO2: 100%   Filed Weights   12/04/18 0753  Weight: 188 lb (85.3 kg)    Physical Exam Vitals signs reviewed.  Constitutional:      Appearance: Normal appearance.  Cardiovascular:     Rate and Rhythm: Normal rate and regular rhythm.     Heart sounds: Normal heart sounds.  Pulmonary:     Effort: Pulmonary effort is normal.     Breath sounds: Normal breath sounds.  Abdominal:     General: There is no distension.     Palpations: Abdomen is soft. There is no mass.  Musculoskeletal:        General: No swelling.  Skin:    General: Skin is warm.  Neurological:     General: No focal deficit present.     Mental Status: He is alert and oriented to person, place, and time.  Psychiatric:        Mood and Affect: Mood normal.        Behavior: Behavior normal.      LABORATORY DATA:  I have reviewed the labs as listed.  CBC    Component Value Date/Time   WBC 3.7 (L) 12/04/2018 0750   RBC 3.71 (L) 12/04/2018 0750   HGB 12.0 (L) 12/04/2018 0750   HGB 11.8 (L) 09/14/2018 1514   HCT 35.7 (L) 12/04/2018 0750   HCT 38.4 09/14/2018 1514   PLT 104 (L) 12/04/2018 0750   PLT 468 (H) 09/14/2018 1514   MCV 96.2 12/04/2018 0750   MCV 87 09/14/2018 1514   MCH 32.3 12/04/2018 0750   MCHC 33.6 12/04/2018 0750   RDW 21.4 (H) 12/04/2018 0750   RDW 14.3 09/14/2018 1514   LYMPHSABS 1.1 12/04/2018 0750   LYMPHSABS 1.8 09/14/2018 1514   MONOABS 0.5 12/04/2018 0750   EOSABS 0.2 12/04/2018 0750   EOSABS 0.6 (H) 09/14/2018 1514   BASOSABS 0.1 12/04/2018  0750   BASOSABS 0.1 09/14/2018 1514   CMP Latest Ref Rng & Units 12/04/2018 11/20/2018 11/06/2018  Glucose 70 - 99 mg/dL 191(H) 208(H) 122(H)  BUN 8 - 23 mg/dL _0 Creatinine 0.61 - 1.24 mg/dL 0.93 1.03 0.95  Sodium 135 - 145 mmol/L 136 137 136  Potassium 3.5 - 5.1 mmol/L 4.0 4.2 3.8  Chloride 98 - 111 mmol/L 104 107 106  CO2 22 - 32 mmol/L _1 Calcium 8.9 - 10.3 mg/dL 9.1 9.1  8.8(L)  Total Protein 6.5 - 8.1 g/dL 6.6 6.7 7.1  Total Bilirubin 0.3 - 1.2 mg/dL 0.6 0.6 0.8  Alkaline Phos 38 - 126 U/L 105 110 106  AST 15 - 41 U/L 33 38 38  ALT 0 - 44 U/L _2 DIAGNOSTIC IMAGING:  I have independently reviewed the scans and discussed with the patient.   I have reviewed Venita Lick LPN's note and agree with the documentation.  I personally performed a face-to-face visit, made revisions and my assessment and plan is as follows.    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  Stage IIb (T4AN0) transverse colon adenocarcinoma, MSI-stable: - He had a history of colon cancer in June 2008, status post resection and did not require any adjuvant therapy. - He had a recurrence in February 2009, underwent resection with colostomy.  This was followed by 6 months of FOLFOX based chemotherapy sandwiching radiation. - He noticed blood in the colostomy bag.  He underwent colonoscopy on 08/25/2018 which showed malignant partially obstructing tumor in the mid transverse colon with active bleeding.  Other parts of the colon was normal.  Biopsy showed adenocarcinoma.  - Completion colectomy with end ileostomy, lysis of adhesions and bladder repair on 08/29/2018 by Dr. Constance Haw. - Pathology showed pT4a, PN0, 0/16 lymph nodes positive, MMR normal.  Margins were negative. -PET/CT scan on 09/28/2018 shows no findings of active malignancy.  There is some mild activity associated with left upper quadrant ascites and anterior abdominal wall wound sites, likely inflammatory but potentially meriting surveillance.  Liver appears unremarkable.  CEA was 2.0. - Because of his advanced age and pre-existing neuropathy, 6 months of adjuvant chemotherapy with 5-FU/LV was recommended.   -Adjuvant chemotherapy with infusional 5-FU/leucovorin started on 10/09/2018. -Cycle 4 chemotherapy on 11/20/2018. -He has baseline diarrhea and empties his bag for 3-4 times a day.  He takes 4  Imodium tablets daily since surgery. -He has noticed slight tiredness after the pump is discontinued.  No worsening of diarrhea reported. -I have reviewed his blood work.  He may proceed with his next cycle today. -We will reevaluate him in 2 weeks.   2.  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 -He reports slight worsening of numbness in the fingertips in the last 2 weeks.  He was treated with gabapentin in the past.       Total time spent is 25 minutes with more than 50% of the time spent face-to-face discussing and reinforcing the treatment plan, side effects and coordination of care.     Orders placed this encounter:  No orders of the defined types were placed in this encounter.     Derek Jack, MD [place] [phone]

## 2018-12-04 NOTE — Patient Instructions (Signed)
[facility] at [facility] Discharge Instructions  You were seen today by Dr. Katragadda. He went over your recent lab results. He will see you back in 2 weeks for labs, treatment and follow up.   Thank you for choosing [facility] at [facility] to provide your oncology and hematology care.  To afford each patient quality time with our provider, please arrive at least 15 minutes before your scheduled appointment time.   If you have a lab appointment with the Cancer Center please come in thru the  Main Entrance and check in at the main information desk  You need to re-schedule your appointment should you arrive 10 or more minutes late.  We strive to give you quality time with our providers, and arriving late affects you and other patients whose appointments are after yours.  Also, if you no show three or more times for appointments you may be dismissed from the clinic at the providers discretion.     Again, thank you for choosing [facility].  Our hope is that these requests will decrease the amount of time that you wait before being seen by our physicians.       _____________________________________________________________  Should you have questions after your visit to [facility], please contact our office at [phone] between the hours of 8:00 a.m. and 4:30 p.m.  Voicemails left after 4:00 p.m. will not be returned until the following business day.  For prescription refill requests, have your pharmacy contact our office and allow 72 hours.    Cancer Center Support Programs:   > Cancer Support Group  2nd Tuesday of the month 1pm-2pm, Journey Room    

## 2018-12-06 ENCOUNTER — Other Ambulatory Visit: Payer: Self-pay

## 2018-12-06 ENCOUNTER — Inpatient Hospital Stay (HOSPITAL_COMMUNITY): Payer: Medicare Other

## 2018-12-06 VITALS — BP 120/60 | HR 66 | Temp 98.0°F | Resp 18

## 2018-12-06 DIAGNOSIS — C184 Malignant neoplasm of transverse colon: Secondary | ICD-10-CM

## 2018-12-06 DIAGNOSIS — Z9221 Personal history of antineoplastic chemotherapy: Secondary | ICD-10-CM | POA: Diagnosis not present

## 2018-12-06 DIAGNOSIS — Z923 Personal history of irradiation: Secondary | ICD-10-CM | POA: Diagnosis not present

## 2018-12-06 DIAGNOSIS — Z85038 Personal history of other malignant neoplasm of large intestine: Secondary | ICD-10-CM | POA: Diagnosis not present

## 2018-12-06 DIAGNOSIS — Z5111 Encounter for antineoplastic chemotherapy: Secondary | ICD-10-CM | POA: Diagnosis not present

## 2018-12-06 DIAGNOSIS — G629 Polyneuropathy, unspecified: Secondary | ICD-10-CM | POA: Diagnosis not present

## 2018-12-06 MED ORDER — HEPARIN SOD (PORK) LOCK FLUSH 100 UNIT/ML IV SOLN
500.0000 [IU] | Freq: Once | INTRAVENOUS | Status: AC | PRN
  Administered 2018-12-06: 13:00:00 500 [IU]

## 2018-12-06 MED ORDER — SODIUM CHLORIDE 0.9% FLUSH
10.0000 mL | INTRAVENOUS | Status: DC | PRN
  Administered 2018-12-06: 10 mL
  Filled 2018-12-06: qty 10

## 2018-12-06 NOTE — Progress Notes (Signed)
Pt presents today for pump d/c. VSS. Pt has no complaints of any changes since the last treatment.    No complaints at this time. Discharged from clinic ambulatory. F/U with [facility] as scheduled.

## 2018-12-18 ENCOUNTER — Encounter (HOSPITAL_COMMUNITY): Payer: Self-pay

## 2018-12-18 ENCOUNTER — Inpatient Hospital Stay (HOSPITAL_BASED_OUTPATIENT_CLINIC_OR_DEPARTMENT_OTHER): Payer: Medicare Other | Admitting: Hematology

## 2018-12-18 ENCOUNTER — Inpatient Hospital Stay (HOSPITAL_COMMUNITY): Payer: Medicare Other | Attending: Hematology

## 2018-12-18 ENCOUNTER — Inpatient Hospital Stay (HOSPITAL_COMMUNITY): Payer: Medicare Other

## 2018-12-18 ENCOUNTER — Other Ambulatory Visit: Payer: Self-pay

## 2018-12-18 ENCOUNTER — Encounter (HOSPITAL_COMMUNITY): Payer: Self-pay | Admitting: Hematology

## 2018-12-18 VITALS — BP 118/74 | HR 61 | Temp 97.5°F | Resp 18 | Wt 188.6 lb

## 2018-12-18 DIAGNOSIS — Z923 Personal history of irradiation: Secondary | ICD-10-CM | POA: Diagnosis not present

## 2018-12-18 DIAGNOSIS — Z9221 Personal history of antineoplastic chemotherapy: Secondary | ICD-10-CM | POA: Insufficient documentation

## 2018-12-18 DIAGNOSIS — Z85038 Personal history of other malignant neoplasm of large intestine: Secondary | ICD-10-CM | POA: Insufficient documentation

## 2018-12-18 DIAGNOSIS — C184 Malignant neoplasm of transverse colon: Secondary | ICD-10-CM

## 2018-12-18 DIAGNOSIS — Z79899 Other long term (current) drug therapy: Secondary | ICD-10-CM | POA: Diagnosis not present

## 2018-12-18 DIAGNOSIS — D696 Thrombocytopenia, unspecified: Secondary | ICD-10-CM | POA: Insufficient documentation

## 2018-12-18 DIAGNOSIS — Z5111 Encounter for antineoplastic chemotherapy: Secondary | ICD-10-CM | POA: Insufficient documentation

## 2018-12-18 DIAGNOSIS — D72819 Decreased white blood cell count, unspecified: Secondary | ICD-10-CM | POA: Diagnosis not present

## 2018-12-18 LAB — COMPREHENSIVE METABOLIC PANEL
ALT: 22 U/L (ref 0–44)
AST: 35 U/L (ref 15–41)
Albumin: 3.5 g/dL (ref 3.5–5.0)
Alkaline Phosphatase: 98 U/L (ref 38–126)
Anion gap: 9 (ref 5–15)
BUN: 11 mg/dL (ref 8–23)
CO2: 24 mmol/L (ref 22–32)
Calcium: 9.2 mg/dL (ref 8.9–10.3)
Chloride: 103 mmol/L (ref 98–111)
Creatinine, Ser: 1.04 mg/dL (ref 0.61–1.24)
GFR calc Af Amer: >60 mL/min (ref >60)
GFR calc non Af Amer: >60 mL/min (ref >60)
Glucose, Bld: 200 mg/dL — ABNORMAL HIGH (ref 70–99)
Potassium: 4.3 mmol/L (ref 3.5–5.1)
Sodium: 136 mmol/L (ref 135–145)
Total Bilirubin: 0.7 mg/dL (ref 0.3–1.2)
Total Protein: 6.7 g/dL (ref 6.5–8.1)

## 2018-12-18 LAB — CBC WITH DIFFERENTIAL/PLATELET
Abs Immature Granulocytes: 0.02 10*3/uL (ref 0.00–0.07)
Basophils Absolute: 0.1 10*3/uL (ref 0.0–0.1)
Basophils Relative: 1 %
Eosinophils Absolute: 0.1 10*3/uL (ref 0.0–0.5)
Eosinophils Relative: 4 %
HCT: 35.4 % — ABNORMAL LOW (ref 39.0–52.0)
Hemoglobin: 12 g/dL — ABNORMAL LOW (ref 13.0–17.0)
Immature Granulocytes: 1 %
Lymphocytes Relative: 30 %
Lymphs Abs: 1.1 10*3/uL (ref 0.7–4.0)
MCH: 33.8 pg (ref 26.0–34.0)
MCHC: 33.9 g/dL (ref 30.0–36.0)
MCV: 99.7 fL (ref 80.0–100.0)
Monocytes Absolute: 0.5 10*3/uL (ref 0.1–1.0)
Monocytes Relative: 14 %
Neutro Abs: 1.8 10*3/uL (ref 1.7–7.7)
Neutrophils Relative %: 50 %
Platelets: 97 10*3/uL — ABNORMAL LOW (ref 150–400)
RBC: 3.55 MIL/uL — ABNORMAL LOW (ref 4.22–5.81)
RDW: 20.4 % — ABNORMAL HIGH (ref 11.5–15.5)
WBC: 3.6 10*3/uL — ABNORMAL LOW (ref 4.0–10.5)
nRBC: 0 % (ref 0.0–0.2)

## 2018-12-18 LAB — MAGNESIUM: Magnesium: 2.2 mg/dL (ref 1.7–2.4)

## 2018-12-18 LAB — LACTATE DEHYDROGENASE: LDH: 133 U/L (ref 98–192)

## 2018-12-18 MED ORDER — SODIUM CHLORIDE 0.9 % IV SOLN
2430.0000 mg/m2 | INTRAVENOUS | Status: DC
  Administered 2018-12-18: 5000 mg via INTRAVENOUS
  Filled 2018-12-18: qty 100

## 2018-12-18 MED ORDER — SODIUM CHLORIDE 0.9 % IV SOLN
400.0000 mg/m2 | Freq: Once | INTRAVENOUS | Status: AC
  Administered 2018-12-18: 820 mg via INTRAVENOUS
  Filled 2018-12-18: qty 41

## 2018-12-18 MED ORDER — SODIUM CHLORIDE 0.9 % IV SOLN
Freq: Once | INTRAVENOUS | Status: AC
  Administered 2018-12-18: 09:00:00 via INTRAVENOUS

## 2018-12-18 MED ORDER — SODIUM CHLORIDE 0.9 % IV SOLN
8.0000 mg | Freq: Once | INTRAVENOUS | Status: AC
  Administered 2018-12-18: 8 mg via INTRAVENOUS
  Filled 2018-12-18: qty 4

## 2018-12-18 MED ORDER — SODIUM CHLORIDE 0.9% FLUSH
10.0000 mL | INTRAVENOUS | Status: DC | PRN
  Administered 2018-12-18: 10 mL
  Filled 2018-12-18: qty 10

## 2018-12-18 MED ORDER — FLUOROURACIL CHEMO INJECTION 2.5 GM/50ML
400.0000 mg/m2 | Freq: Once | INTRAVENOUS | Status: AC
  Administered 2018-12-18: 800 mg via INTRAVENOUS
  Filled 2018-12-18: qty 16

## 2018-12-18 NOTE — Progress Notes (Signed)
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[place])    09/29/2018 Cancer Staging    Staging form: Colon and Rectum, AJCC 8th Edition - Clinical stage from 09/29/2018: Stage IIB (cT4a, cN0, cM0) - Signed by Derek Jack, MD on 09/29/2018    10/09/2018 -  Chemotherapy    The patient had ondansetron (ZOFRAN) 8 mg in sodium chloride 0.9 % 50 mL IVPB, 8 mg (100 % of original dose 8 mg), Intravenous,  Once, 5 of 12 cycles Dose modification: 8 mg (original dose 8 mg, Cycle 1) Administration: 8 mg (10/09/2018), 8 mg (10/23/2018), 8 mg (11/06/2018), 8 mg (11/20/2018), 8 mg (12/04/2018) leucovorin 800 mg in dextrose 5 % 250 mL infusion, 820 mg, Intravenous,  Once, 5 of 12 cycles Administration: 800 mg (10/09/2018), 820 mg (10/23/2018), 820 mg (11/06/2018), 820 mg (11/20/2018), 820 mg (12/04/2018) fluorouracil (ADRUCIL) chemo injection 800 mg, 400 mg/m2 = 800 mg, Intravenous,  Once, 5 of 12 cycles Administration: 800 mg (10/09/2018), 800 mg (10/23/2018), 800 mg (11/06/2018), 800 mg (11/20/2018), 800 mg (12/04/2018) fluorouracil (ADRUCIL) 4,900 mg in sodium chloride 0.9 % 52 mL chemo infusion, 2,400 mg/m2 = 4,900 mg, Intravenous, 1 Day/Dose, 5 of 12 cycles Administration: 4,900 mg (10/09/2018), 4,900 mg (10/23/2018), 5,000 mg (11/06/2018), 5,000 mg (11/20/2018), 5,000 mg (12/04/2018)  for chemotherapy treatment.       CANCER STAGING: Cancer Staging Cancer of transverse colon [facility]) Staging form: Colon and Rectum, AJCC 8th Edition - Clinical stage from 09/29/2018: Stage IIB (cT4a, cN0, cM0) - Signed by Derek Jack, MD on 09/29/2018    INTERVAL HISTORY:  Jose Lawrence 78 y.o. male returns for routine  follow-up and consideration for next cycle of chemotherapy. He is here today alone. He states that he has done well since his last treatment. Denies any nausea, vomiting, or diarrhea. He states that he continues to have some numbness in his hands and feet that has not changed at all. He states that he has more fatigue during the first week after treatment that gets better with the second week. Denies any new pains. Had not noticed any recent bleeding such as epistaxis, hematuria or hematochezia. Denies recent chest pain on exertion, shortness of breath on minimal exertion, pre-syncopal episodes, or palpitations. Denies any recent fevers, infections, or recent hospitalizations. Patient reports appetite at 100% and energy level at 50%.      REVIEW OF SYSTEMS:  Review of Systems  Neurological: Positive for numbness.     PAST MEDICAL/SURGICAL HISTORY:  Past Medical History:  Diagnosis Date  . Anemia    Causing interruption in anticoagulation  . Atrial fibrillation and flutter ([place])   . BPH (benign prostatic hypertrophy)   . Colon cancer ([place])    Status post LAR 2008  . Hypothyroidism   . Lacunar infarction ([place]) 11/2012   RT 3rd NERVE PALSY, SB [name]  . PE (pulmonary embolism)    Temporarily on Eliquis 2017  . Skin cancer    35 years ago  . Type 2 diabetes mellitus ([place])    Past Surgical History:  Procedure Laterality Date  . ABDOMINOPERINEAL PROCTOCOLECTOMY  2009   end colostomy   .  BIOPSY  08/25/2018   Procedure: BIOPSY;  Surgeon: Rogene Houston, MD;  Location: [facility] ENDO SU[facility];  Service: Endoscopy;;  . BLADDER REPAIR  08/29/2018   Procedure: BLADDER REPAIR;  Surgeon: [name], MD;  Location: [facility] ORS;  Service: General;;  . COLECTOMY  2008  . COLONOSCOPY WITH PROPOFOL N/A 08/25/2018   Procedure: COLONOSCOPY WITH PROPOFOL;  Surgeon: Rogene Houston, MD;  Location: [facility] ENDO SUITE;  Service: Endoscopy;  Laterality: N/A;  2:20  . COLOSTOMY    . EXPLORATORY LAPAROTOMY   2017   ? pneumoperitoneum of unknown etiology/ negative Ex lap  . ILEOSTOMY  08/29/2018   Procedure: ILEOSTOMY;  Surgeon: [name], MD;  Location: [facility] ORS;  Service: General;;  . LAPAROSCOPIC CHOLECYSTECTOMY  2007  . LYSIS OF ADHESION  08/29/2018   Procedure: LYSIS OF ADHESION;  Surgeon: [name], MD;  Location: [facility] ORS;  Service: General;;  . PARTIAL COLECTOMY N/A 08/29/2018   Procedure: PARTIAL COLECTOMY;  Surgeon: [name], MD;  Location: [facility] ORS;  Service: General;  Laterality: N/A;  . PORTACATH PLACEMENT Left 10/04/2018   Procedure: INSERTION PORT-A-CATH (attached catheter left subclavian);  Surgeon: [name], MD;  Location: [facility] ORS;  Service: General;  Laterality: Left;  . SBO SURGERY  06/2009     SOCIAL HISTORY:  Social History   Socioeconomic History  . Marital status: Married    Spouse name: Not on file  . Number of children: Not on file  . Years of education: Not on file  . Highest education level: Not on file  Occupational History  . Occupation: Architect    Comment: [employer]  Social Needs  . Financial resource strain: Not hard at all  . Food insecurity:    Worry: Never true    Inability: Never true  . Transportation needs:    Medical: No    Non-medical: No  Tobacco Use  . Smoking status: Former Smoker    Packs/day: 1.00    Years: 27.00    Pack years: 27.00    Types: Cigarettes    Start date: 01/27/1964    Last attempt to quit: 01/27/1991    Years since quitting: 27.9  . Smokeless tobacco: Former Systems developer    Types: [place] date: 08/10/1995  Substance and Sexual Activity  . Alcohol use: Never    Alcohol/week: 0.0 standard drinks    Frequency: Never  . Drug use: Never  . Sexual activity: Not Currently  Lifestyle  . Physical activity:    Days per week: 0 days    Minutes per session: 0 min  . Stress: Not at all  Relationships  . Social connections:    Talks on phone: Twice a week    Gets together: Once a week     Attends religious service: Not on file    Active member of club or organization: Not on file    Attends meetings of clubs or organizations: Not on file    Relationship status: Not on file  . Intimate partner violence:    Fear of current or ex partner: No    Emotionally abused: No    Physically abused: No    Forced sexual activity: No  Other Topics Concern  . Not on file  Social History Narrative  . Not on file    FAMILY HISTORY:  Family History  Problem Relation Age of Onset  . Heart disease Father   . Heart failure Father   .  Heart attack Father   . Prostate cancer Paternal Uncle     CURRENT MEDICATIONS:  Outpatient Encounter Medications as of 12/18/2018  Medication Sig  . aspirin 81 MG tablet Take 81 mg by mouth daily.  . cetirizine (ZYRTEC) 10 MG tablet Take 10 mg by mouth daily.  . ferrous sulfate (FERROUSUL) 325 (65 FE) MG tablet Take 1 tablet (325 mg total) by mouth 2 (two) times daily with a meal.  . glipiZIDE (GLUCOTROL) 10 MG tablet Take 10 mg by mouth 2 (two) times daily.  Marland Kitchen levothyroxine (SYNTHROID, LEVOTHROID) 50 MCG tablet Take 50 mcg by mouth daily before breakfast.  . lidocaine-prilocaine (EMLA) cream Apply to affected area once  . loperamide (IMODIUM) 2 MG capsule Take 2 capsules (4 mg total) by mouth 4 (four) times daily -  before meals and at bedtime.  . metoprolol tartrate (LOPRESSOR) 25 MG tablet Take 1 tablet (25 mg total) by mouth 2 (two) times daily.  . prochlorperazine (COMPAZINE) 10 MG tablet Take 1 tablet (10 mg total) by mouth every 6 (six) hours as needed (Nausea or vomiting).  . psyllium (HYDROCIL/METAMUCIL) 95 % PACK Take 1 packet by mouth daily.  . tamsulosin (FLOMAX) 0.4 MG CAPS capsule Take 0.4 mg by mouth at bedtime.   . vitamin C (ASCORBIC ACID) 500 MG tablet Take 1 tablet (500 mg total) by mouth daily.   Facility-Administered Encounter Medications as of 12/18/2018  Medication  . iothalamate meglumine (CYSTO-CONRAY II) 17.2 % solution 250 mL     ALLERGIES:  No Known Allergies   PHYSICAL EXAM:  ECOG Performance status: 1 Blood pressure 118/74.  Pulse rate is 61.  Respiratory is 18.  Temperature 98.  Saturations 100%. Physical Exam Vitals signs reviewed.  Constitutional:      Appearance: Normal appearance.  Cardiovascular:     Rate and Rhythm: Normal rate and regular rhythm.     Heart sounds: Normal heart sounds.  Pulmonary:     Effort: Pulmonary effort is normal.     Breath sounds: Normal breath sounds.  Abdominal:     General: There is no distension.     Palpations: Abdomen is soft. There is no mass.  Musculoskeletal:        General: No swelling.  Skin:    General: Skin is warm.  Neurological:     General: No focal deficit present.     Mental Status: He is alert and oriented to person, place, and time.  Psychiatric:        Mood and Affect: Mood normal.        Behavior: Behavior normal.      LABORATORY DATA:  I have reviewed the labs as listed.  CBC    Component Value Date/Time   WBC 3.6 (L) 12/18/2018 0803   RBC 3.55 (L) 12/18/2018 0803   HGB 12.0 (L) 12/18/2018 0803   HGB 11.8 (L) 09/14/2018 1514   HCT 35.4 (L) 12/18/2018 0803   HCT 38.4 09/14/2018 1514   PLT 97 (L) 12/18/2018 0803   PLT 468 (H) 09/14/2018 1514   MCV 99.7 12/18/2018 0803   MCV 87 09/14/2018 1514   MCH 33.8 12/18/2018 0803   MCHC 33.9 12/18/2018 0803   RDW 20.4 (H) 12/18/2018 0803   RDW 14.3 09/14/2018 1514   LYMPHSABS 1.1 12/18/2018 0803   LYMPHSABS 1.8 09/14/2018 1514   MONOABS 0.5 12/18/2018 0803   EOSABS 0.1 12/18/2018 0803   EOSABS 0.6 (H) 09/14/2018 1514   BASOSABS 0.1 12/18/2018 0803   BASOSABS  0.1 09/14/2018 1514   CMP Latest Ref Rng & Units 12/18/2018 12/04/2018 11/20/2018  Glucose 70 - 99 mg/dL 200(H) 191(H) 208(H)  BUN 8 - 23 mg/dL _0 Creatinine 0.61 - 1.24 mg/dL 1.04 0.93 1.03  Sodium 135 - 145 mmol/L 136 136 137  Potassium 3.5 - 5.1 mmol/L 4.3 4.0 4.2  Chloride 98 - 111 mmol/L 103 104 107  CO2 22 - 32  mmol/L _1 Calcium 8.9 - 10.3 mg/dL 9.2 9.1 9.1  Total Protein 6.5 - 8.1 g/dL 6.7 6.6 6.7  Total Bilirubin 0.3 - 1.2 mg/dL 0.7 0.6 0.6  Alkaline Phos 38 - 126 U/L 98 105 110  AST 15 - 41 U/L 35 33 38  ALT 0 - 44 U/L _2 DIAGNOSTIC IMAGING:  I have independently reviewed the scans and discussed with the patient.   I have reviewed Venita Lick LPN's note and agree with the documentation.  I personally performed a face-to-face visit, made revisions and my assessment and plan is as follows.    ASSESSMENT & PLAN:   Cancer of transverse colon ([place]) 1.  Stage IIb (T4AN0) transverse colon adenocarcinoma, MSI-stable: - He had a history of colon cancer in June 2008, status post resection and did not require any adjuvant therapy. - He had a recurrence in February 2009, underwent resection with colostomy.  This was followed by 6 months of FOLFOX based chemotherapy sandwiching radiation. - He noticed blood in the colostomy bag.  He underwent colonoscopy on 08/25/2018 which showed malignant partially obstructing tumor in the mid transverse colon with active bleeding.  Other parts of the colon was normal.  Biopsy showed adenocarcinoma.  - Completion colectomy with end ileostomy, lysis of adhesions and bladder repair on 08/29/2018 by Dr. Constance Haw. - Pathology showed pT4a, PN0, 0/16 lymph nodes positive, MMR normal.  Margins were negative. -PET/CT scan on 09/28/2018 shows no findings of active malignancy.  There is some mild activity associated with left upper quadrant ascites and anterior abdominal wall wound sites, likely inflammatory but potentially meriting surveillance.  Liver appears unremarkable.  CEA was 2.0. -Adjuvant chemotherapy with infusional 5-FU/leucovorin started on 10/09/2018.  -Cycle 5 chemotherapy on 12/04/2018. -He has baseline diarrhea and empties his bag 3-4 times per day.  He is taking 4 Imodium tablets daily since surgery. -He has tiredness which lasts about 1 week  after each treatment.  Second week he is able to play golf. - We reviewed his blood work.  White count is slightly low at 3.7 but ANC is 1800.  Platelet count is slightly decreased to 94. -He may proceed with his next cycle today.  I will see him back in 2 weeks for follow-up.  2.  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 -Numbness in the fingertips is slightly worse than the feet.  This has been stable.  He was treated with gabapentin in the past.        Total time spent is 25 minutes with more than 50% of the time spent face-to-face discussing treatment plan, side effects and coordination of care.    Orders placed this encounter:  Orders Placed This Encounter  Procedures  . CBC with Differential/Platelet  . Comprehensive metabolic panel      Derek Jack, MD [place] [phone]

## 2018-12-18 NOTE — Patient Instructions (Addendum)
[facility] at [facility] Discharge Instructions  You were seen today by Dr. Katragadda. He went over your recent lab results. He will see you back in 2 weeks for labs, treatment and follow up.   Thank you for choosing [facility] at [facility] to provide your oncology and hematology care.  To afford each patient quality time with our provider, please arrive at least 15 minutes before your scheduled appointment time.   If you have a lab appointment with the Cancer Center please come in thru the  Main Entrance and check in at the main information desk  You need to re-schedule your appointment should you arrive 10 or more minutes late.  We strive to give you quality time with our providers, and arriving late affects you and other patients whose appointments are after yours.  Also, if you no show three or more times for appointments you may be dismissed from the clinic at the providers discretion.     Again, thank you for choosing [facility].  Our hope is that these requests will decrease the amount of time that you wait before being seen by our physicians.       _____________________________________________________________  Should you have questions after your visit to [facility], please contact our office at [phone] between the hours of 8:00 a.m. and 4:30 p.m.  Voicemails left after 4:00 p.m. will not be returned until the following business day.  For prescription refill requests, have your pharmacy contact our office and allow 72 hours.    Cancer Center Support Programs:   > Cancer Support Group  2nd Tuesday of the month 1pm-2pm, Journey Room    

## 2018-12-18 NOTE — Progress Notes (Signed)
Labs reviewed with MD today at office visit. Proceed with treatment.   Treatment given per orders. Patient tolerated it well without problems. Vitals stable and discharged home from clinic ambulatory. Follow up as scheduled.

## 2018-12-18 NOTE — Patient Instructions (Signed)
[facility] Discharge Instructions for Patients Receiving Chemotherapy  Today you received the following chemotherapy agents   To help prevent nausea and vomiting after your treatment, we encourage you to take your nausea medication   If you develop nausea and vomiting that is not controlled by your nausea medication, call the clinic.   BELOW ARE SYMPTOMS THAT SHOULD BE REPORTED IMMEDIATELY:  *FEVER GREATER THAN 100.5 F  *CHILLS WITH OR WITHOUT FEVER  NAUSEA AND VOMITING THAT IS NOT CONTROLLED WITH YOUR NAUSEA MEDICATION  *UNUSUAL SHORTNESS OF BREATH  *UNUSUAL BRUISING OR BLEEDING  TENDERNESS IN MOUTH AND THROAT WITH OR WITHOUT PRESENCE OF ULCERS  *URINARY PROBLEMS  *BOWEL PROBLEMS  UNUSUAL RASH Items with * indicate a potential emergency and should be followed up as soon as possible.  Feel free to call the clinic should you have any questions or concerns. The clinic phone number is [phone].  Please show the CHEMO ALERT CARD at check-in to the Emergency Department and triage nurse.   

## 2018-12-18 NOTE — Assessment & Plan Note (Addendum)
1.  Stage IIb (T4AN0) transverse colon adenocarcinoma, MSI-stable: - He had a history of colon cancer in June 2008, status post resection and did not require any adjuvant therapy. - He had a recurrence in February 2009, underwent resection with colostomy.  This was followed by 6 months of FOLFOX based chemotherapy sandwiching radiation. - He noticed blood in the colostomy bag.  He underwent colonoscopy on 08/25/2018 which showed malignant partially obstructing tumor in the mid transverse colon with active bleeding.  Other parts of the colon was normal.  Biopsy showed adenocarcinoma.  - Completion colectomy with end ileostomy, lysis of adhesions and bladder repair on 08/29/2018 by Dr. Constance Haw. - Pathology showed pT4a, PN0, 0/16 lymph nodes positive, MMR normal.  Margins were negative. -PET/CT scan on 09/28/2018 shows no findings of active malignancy.  There is some mild activity associated with left upper quadrant ascites and anterior abdominal wall wound sites, likely inflammatory but potentially meriting surveillance.  Liver appears unremarkable.  CEA was 2.0. -Adjuvant chemotherapy with infusional 5-FU/leucovorin started on 10/09/2018.  -Cycle 5 chemotherapy on 12/04/2018. -He has baseline diarrhea and empties his bag 3-4 times per day.  He is taking 4 Imodium tablets daily since surgery. -He has tiredness which lasts about 1 week after each treatment.  Second week he is able to play golf. - We reviewed his blood work.  White count is slightly low at 3.7 but ANC is 1800.  Platelet count is slightly decreased to 94. -He may proceed with his next cycle today.  I will see him back in 2 weeks for follow-up.  2.  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 -Numbness in the fingertips is slightly worse than the feet.  This has been stable.  He was treated with gabapentin in the past.

## 2018-12-19 LAB — CEA: CEA: 1.5 ng/mL (ref 0.0–4.7)

## 2018-12-20 ENCOUNTER — Encounter (HOSPITAL_COMMUNITY): Payer: Self-pay

## 2018-12-20 ENCOUNTER — Other Ambulatory Visit: Payer: Self-pay

## 2018-12-20 ENCOUNTER — Inpatient Hospital Stay (HOSPITAL_COMMUNITY): Payer: Medicare Other

## 2018-12-20 VITALS — BP 120/68 | HR 60 | Temp 97.9°F | Resp 18

## 2018-12-20 DIAGNOSIS — C184 Malignant neoplasm of transverse colon: Secondary | ICD-10-CM

## 2018-12-20 DIAGNOSIS — Z85038 Personal history of other malignant neoplasm of large intestine: Secondary | ICD-10-CM | POA: Diagnosis not present

## 2018-12-20 DIAGNOSIS — Z5111 Encounter for antineoplastic chemotherapy: Secondary | ICD-10-CM | POA: Diagnosis not present

## 2018-12-20 DIAGNOSIS — D72819 Decreased white blood cell count, unspecified: Secondary | ICD-10-CM | POA: Diagnosis not present

## 2018-12-20 DIAGNOSIS — D696 Thrombocytopenia, unspecified: Secondary | ICD-10-CM | POA: Diagnosis not present

## 2018-12-20 DIAGNOSIS — Z9221 Personal history of antineoplastic chemotherapy: Secondary | ICD-10-CM | POA: Diagnosis not present

## 2018-12-20 MED ORDER — SODIUM CHLORIDE 0.9% FLUSH
10.0000 mL | INTRAVENOUS | Status: DC | PRN
  Administered 2018-12-20: 10 mL
  Filled 2018-12-20: qty 10

## 2018-12-20 MED ORDER — HEPARIN SOD (PORK) LOCK FLUSH 100 UNIT/ML IV SOLN
500.0000 [IU] | Freq: Once | INTRAVENOUS | Status: AC | PRN
  Administered 2018-12-20: 14:00:00 500 [IU]

## 2018-12-20 NOTE — Progress Notes (Signed)
Patients chemotherapy pump disconnected without complaints voiced.  Good blood return noted.  No bruising or swelling noted at site.  Band aid applied.  VSS with discharge and left ambulatory with no s/s of distress noted.

## 2018-12-20 NOTE — Patient Instructions (Signed)
[facility] at [facility]  Discharge Instructions:   _______________________________________________________________  Thank you for choosing [facility] at [facility] to provide your oncology and hematology care.  To afford each patient quality time with our providers, please arrive at least 15 minutes before your scheduled appointment.  You need to re-schedule your appointment if you arrive 10 or more minutes late.  We strive to give you quality time with our providers, and arriving late affects you and other patients whose appointments are after yours.  Also, if you no show three or more times for appointments you may be dismissed from the clinic.  Again, thank you for choosing [facility] at [facility]. Our hope is that these requests will allow you access to exceptional care and in a timely manner. _______________________________________________________________  If you have questions after your visit, please contact our office at [phone] between the hours of 8:30 a.m. and 5:00 p.m. Voicemails left after 4:30 p.m. will not be returned until the following business day. _______________________________________________________________  For prescription refill requests, have your pharmacy contact our office. _______________________________________________________________  Recommendations made by the consultant and any test results will be sent to your referring physician. _______________________________________________________________ 

## 2018-12-21 ENCOUNTER — Telehealth: Payer: Self-pay

## 2018-12-21 NOTE — Telephone Encounter (Signed)
Patient called to request med refill, patient was instructed to contact oncology or PCP to obtain refill. Patient expresses understanding.

## 2018-12-22 DIAGNOSIS — E1165 Type 2 diabetes mellitus with hyperglycemia: Secondary | ICD-10-CM | POA: Diagnosis not present

## 2018-12-22 DIAGNOSIS — Z6826 Body mass index (BMI) 26.0-26.9, adult: Secondary | ICD-10-CM | POA: Diagnosis not present

## 2018-12-22 DIAGNOSIS — I10 Essential (primary) hypertension: Secondary | ICD-10-CM | POA: Diagnosis not present

## 2018-12-22 DIAGNOSIS — Z299 Encounter for prophylactic measures, unspecified: Secondary | ICD-10-CM | POA: Diagnosis not present

## 2018-12-22 DIAGNOSIS — R21 Rash and other nonspecific skin eruption: Secondary | ICD-10-CM | POA: Diagnosis not present

## 2018-12-22 DIAGNOSIS — R197 Diarrhea, unspecified: Secondary | ICD-10-CM | POA: Diagnosis not present

## 2019-01-02 ENCOUNTER — Inpatient Hospital Stay (HOSPITAL_COMMUNITY): Payer: Medicare Other

## 2019-01-02 ENCOUNTER — Encounter (HOSPITAL_COMMUNITY): Payer: Self-pay

## 2019-01-02 ENCOUNTER — Encounter (HOSPITAL_COMMUNITY): Payer: Self-pay | Admitting: Hematology

## 2019-01-02 ENCOUNTER — Other Ambulatory Visit: Payer: Self-pay

## 2019-01-02 ENCOUNTER — Inpatient Hospital Stay (HOSPITAL_BASED_OUTPATIENT_CLINIC_OR_DEPARTMENT_OTHER): Payer: Medicare Other | Admitting: Hematology

## 2019-01-02 VITALS — BP 129/58 | HR 55 | Temp 98.0°F | Resp 18

## 2019-01-02 DIAGNOSIS — D72819 Decreased white blood cell count, unspecified: Secondary | ICD-10-CM

## 2019-01-02 DIAGNOSIS — Z79899 Other long term (current) drug therapy: Secondary | ICD-10-CM | POA: Diagnosis not present

## 2019-01-02 DIAGNOSIS — Z85038 Personal history of other malignant neoplasm of large intestine: Secondary | ICD-10-CM | POA: Diagnosis not present

## 2019-01-02 DIAGNOSIS — D696 Thrombocytopenia, unspecified: Secondary | ICD-10-CM

## 2019-01-02 DIAGNOSIS — C184 Malignant neoplasm of transverse colon: Secondary | ICD-10-CM | POA: Diagnosis not present

## 2019-01-02 DIAGNOSIS — Z9221 Personal history of antineoplastic chemotherapy: Secondary | ICD-10-CM | POA: Diagnosis not present

## 2019-01-02 DIAGNOSIS — Z923 Personal history of irradiation: Secondary | ICD-10-CM | POA: Diagnosis not present

## 2019-01-02 DIAGNOSIS — Z5111 Encounter for antineoplastic chemotherapy: Secondary | ICD-10-CM | POA: Diagnosis not present

## 2019-01-02 LAB — CBC WITH DIFFERENTIAL/PLATELET
Abs Immature Granulocytes: 0.02 10*3/uL (ref 0.00–0.07)
Basophils Absolute: 0.1 10*3/uL (ref 0.0–0.1)
Basophils Relative: 1 %
Eosinophils Absolute: 0.1 10*3/uL (ref 0.0–0.5)
Eosinophils Relative: 3 %
HCT: 37 % — ABNORMAL LOW (ref 39.0–52.0)
Hemoglobin: 12.6 g/dL — ABNORMAL LOW (ref 13.0–17.0)
Immature Granulocytes: 1 %
Lymphocytes Relative: 26 %
Lymphs Abs: 1.1 10*3/uL (ref 0.7–4.0)
MCH: 34.8 pg — ABNORMAL HIGH (ref 26.0–34.0)
MCHC: 34.1 g/dL (ref 30.0–36.0)
MCV: 102.2 fL — ABNORMAL HIGH (ref 80.0–100.0)
Monocytes Absolute: 0.6 10*3/uL (ref 0.1–1.0)
Monocytes Relative: 14 %
Neutro Abs: 2.3 10*3/uL (ref 1.7–7.7)
Neutrophils Relative %: 55 %
Platelets: 93 10*3/uL — ABNORMAL LOW (ref 150–400)
RBC: 3.62 MIL/uL — ABNORMAL LOW (ref 4.22–5.81)
RDW: 19.5 % — ABNORMAL HIGH (ref 11.5–15.5)
WBC: 4.1 10*3/uL (ref 4.0–10.5)
nRBC: 0 % (ref 0.0–0.2)

## 2019-01-02 LAB — COMPREHENSIVE METABOLIC PANEL
ALT: 31 U/L (ref 0–44)
AST: 35 U/L (ref 15–41)
Albumin: 3.5 g/dL (ref 3.5–5.0)
Alkaline Phosphatase: 101 U/L (ref 38–126)
Anion gap: 10 (ref 5–15)
BUN: 12 mg/dL (ref 8–23)
CO2: 23 mmol/L (ref 22–32)
Calcium: 9.2 mg/dL (ref 8.9–10.3)
Chloride: 106 mmol/L (ref 98–111)
Creatinine, Ser: 0.96 mg/dL (ref 0.61–1.24)
GFR calc Af Amer: >60 mL/min (ref >60)
GFR calc non Af Amer: >60 mL/min (ref >60)
Glucose, Bld: 220 mg/dL — ABNORMAL HIGH (ref 70–99)
Potassium: 4 mmol/L (ref 3.5–5.1)
Sodium: 139 mmol/L (ref 135–145)
Total Bilirubin: 0.5 mg/dL (ref 0.3–1.2)
Total Protein: 6.6 g/dL (ref 6.5–8.1)

## 2019-01-02 LAB — LACTATE DEHYDROGENASE: LDH: 140 U/L (ref 98–192)

## 2019-01-02 LAB — MAGNESIUM: Magnesium: 2.2 mg/dL (ref 1.7–2.4)

## 2019-01-02 MED ORDER — SODIUM CHLORIDE 0.9 % IV SOLN
8.0000 mg | Freq: Once | INTRAVENOUS | Status: AC
  Administered 2019-01-02: 8 mg via INTRAVENOUS
  Filled 2019-01-02: qty 4

## 2019-01-02 MED ORDER — SODIUM CHLORIDE 0.9 % IV SOLN
Freq: Once | INTRAVENOUS | Status: AC
  Administered 2019-01-02: 09:00:00 via INTRAVENOUS

## 2019-01-02 MED ORDER — SODIUM CHLORIDE 0.9% FLUSH
10.0000 mL | INTRAVENOUS | Status: DC | PRN
  Administered 2019-01-02: 10 mL
  Filled 2019-01-02: qty 10

## 2019-01-02 MED ORDER — FLUOROURACIL CHEMO INJECTION 2.5 GM/50ML
400.0000 mg/m2 | Freq: Once | INTRAVENOUS | Status: AC
  Administered 2019-01-02: 800 mg via INTRAVENOUS
  Filled 2019-01-02: qty 16

## 2019-01-02 MED ORDER — SODIUM CHLORIDE 0.9 % IV SOLN
400.0000 mg/m2 | Freq: Once | INTRAVENOUS | Status: AC
  Administered 2019-01-02: 820 mg via INTRAVENOUS
  Filled 2019-01-02: qty 41

## 2019-01-02 MED ORDER — SODIUM CHLORIDE 0.9 % IV SOLN
2430.0000 mg/m2 | INTRAVENOUS | Status: DC
  Administered 2019-01-02: 5000 mg via INTRAVENOUS
  Filled 2019-01-02: qty 100

## 2019-01-02 NOTE — Progress Notes (Signed)
 [facility] [address] [place], [place] [address]   CLINIC:  Medical Oncology/Hematology  PCP:  Vyas, Dhruv B, MD [address] [place] [address] [phone]   REASON FOR VISIT:  Follow-up for colon cancer    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HCC)   09/23/2018 Initial Diagnosis    Cancer of transverse colon (HCC)    09/29/2018 Cancer Staging    Staging form: Colon and Rectum, AJCC 8th Edition - Clinical stage from 09/29/2018: Stage IIB (cT4a, cN0, cM0) - Signed by Katragadda, Sreedhar, MD on 09/29/2018    10/09/2018 -  Chemotherapy    The patient had ondansetron (ZOFRAN) 8 mg in sodium chloride 0.9 % 50 mL IVPB, 8 mg (100 % of original dose 8 mg), Intravenous,  Once, 6 of 12 cycles Dose modification: 8 mg (original dose 8 mg, Cycle 1) Administration: 8 mg (10/09/2018), 8 mg (10/23/2018), 8 mg (11/06/2018), 8 mg (11/20/2018), 8 mg (12/04/2018), 8 mg (12/18/2018) leucovorin 800 mg in dextrose 5 % 250 mL infusion, 820 mg, Intravenous,  Once, 6 of 12 cycles Administration: 800 mg (10/09/2018), 820 mg (10/23/2018), 820 mg (11/06/2018), 820 mg (11/20/2018), 820 mg (12/04/2018), 820 mg (12/18/2018) fluorouracil (ADRUCIL) chemo injection 800 mg, 400 mg/m2 = 800 mg, Intravenous,  Once, 6 of 12 cycles Administration: 800 mg (10/09/2018), 800 mg (10/23/2018), 800 mg (11/06/2018), 800 mg (11/20/2018), 800 mg (12/04/2018), 800 mg (12/18/2018) fluorouracil (ADRUCIL) 4,900 mg in sodium chloride 0.9 % 52 mL chemo infusion, 2,400 mg/m2 = 4,900 mg, Intravenous, 1 Day/Dose, 6 of 12 cycles Administration: 4,900 mg (10/09/2018), 4,900 mg (10/23/2018), 5,000 mg (11/06/2018), 5,000 mg (11/20/2018), 5,000 mg (12/04/2018), 5,000 mg (12/18/2018)  for chemotherapy treatment.       CANCER STAGING: Cancer Staging Cancer of transverse colon (HCC) Staging form: Colon and Rectum, AJCC 8th Edition - Clinical stage from 09/29/2018: Stage IIB (cT4a, cN0, cM0) - Signed by Katragadda, Sreedhar, MD on  09/29/2018    INTERVAL HISTORY:  Jose Lawrence 77 y.o. male returns for follow-up and next cycle of chemotherapy.  He received cycle 6 on 12/18/2018.  He felt tired for the first week.  He denied any nausea, vomiting, diarrhea or abdominal cramping.  He did develop some rash during the first week of treatment.  He was seen by Dr. Vyas and was given triamcinolone cream.  The cream helped control the rash.  The rash was mainly on the upper extremities.  He had this rash before and has gotten slightly worse.  Numbness in the extremities has been stable.  Denies any fevers or infections.    REVIEW OF SYSTEMS:  Review of Systems  Skin: Positive for itching and rash.  Neurological: Positive for numbness.     PAST MEDICAL/SURGICAL HISTORY:  Past Medical History:  Diagnosis Date  . Anemia    Causing interruption in anticoagulation  . Atrial fibrillation and flutter (HCC)   . BPH (benign prostatic hypertrophy)   . Colon cancer (HCC)    Status post LAR 2008  . Hypothyroidism   . Lacunar infarction (HCC) 11/2012   RT 3rd NERVE PALSY, SB Dr. Haines  . PE (pulmonary embolism)    Temporarily on Eliquis 2017  . Skin cancer    35 years ago  . Type 2 diabetes mellitus (HCC)    Past Surgical History:  Procedure Laterality Date  . ABDOMINOPERINEAL PROCTOCOLECTOMY  2009   end colostomy   . BIOPSY  08/25/2018   Procedure: BIOPSY;    Surgeon: Rogene Houston, MD;  Location: [facility];  Service: Endoscopy;;  . BLADDER REPAIR  08/29/2018   Procedure: BLADDER REPAIR;  Surgeon: [name], MD;  Location: [facility] ORS;  Service: General;;  . COLECTOMY  2008  . COLONOSCOPY WITH PROPOFOL N/A 08/25/2018   Procedure: COLONOSCOPY WITH PROPOFOL;  Surgeon: Rogene Houston, MD;  Location: [facility];  Service: Endoscopy;  Laterality: N/A;  2:20  . COLOSTOMY    . EXPLORATORY LAPAROTOMY  2017   ? pneumoperitoneum of unknown etiology/ negative Ex lap  . ILEOSTOMY  08/29/2018   Procedure: ILEOSTOMY;   Surgeon: [name], MD;  Location: [facility] ORS;  Service: General;;  . LAPAROSCOPIC CHOLECYSTECTOMY  2007  . LYSIS OF ADHESION  08/29/2018   Procedure: LYSIS OF ADHESION;  Surgeon: [name], MD;  Location: [facility] ORS;  Service: General;;  . PARTIAL COLECTOMY N/A 08/29/2018   Procedure: PARTIAL COLECTOMY;  Surgeon: [name], MD;  Location: [facility] ORS;  Service: General;  Laterality: N/A;  . PORTACATH PLACEMENT Left 10/04/2018   Procedure: INSERTION PORT-A-CATH (attached catheter left subclavian);  Surgeon: [name], MD;  Location: [facility] ORS;  Service: General;  Laterality: Left;  . SBO SURGERY  06/2009     SOCIAL HISTORY:  Social History   Socioeconomic History  . Marital status: Married    Spouse name: Not on file  . Number of children: Not on file  . Years of education: Not on file  . Highest education level: Not on file  Occupational History  . Occupation: Architect    Comment: [employer]  Social Needs  . Financial resource strain: Not hard at all  . Food insecurity:    Worry: Never true    Inability: Never true  . Transportation needs:    Medical: No    Non-medical: No  Tobacco Use  . Smoking status: Former Smoker    Packs/day: 1.00    Years: 27.00    Pack years: 27.00    Types: Cigarettes    Start date: 01/27/1964    Last attempt to quit: 01/27/1991    Years since quitting: 27.9  . Smokeless tobacco: Former Systems developer    Types: [place] date: 08/10/1995  Substance and Sexual Activity  . Alcohol use: Never    Alcohol/week: 0.0 standard drinks    Frequency: Never  . Drug use: Never  . Sexual activity: Not Currently  Lifestyle  . Physical activity:    Days per week: 0 days    Minutes per session: 0 min  . Stress: Not at all  Relationships  . Social connections:    Talks on phone: Twice a week    Gets together: Once a week    Attends religious service: Not on file    Active member of club or organization: Not on file    Attends meetings of  clubs or organizations: Not on file    Relationship status: Not on file  . Intimate partner violence:    Fear of current or ex partner: No    Emotionally abused: No    Physically abused: No    Forced sexual activity: No  Other Topics Concern  . Not on file  Social History Narrative  . Not on file    FAMILY HISTORY:  Family History  Problem Relation Age of Onset  . Heart disease Father   . Heart failure Father   . Heart attack Father   . Prostate cancer  Paternal Uncle     CURRENT MEDICATIONS:  Outpatient Encounter Medications as of 01/02/2019  Medication Sig  . aspirin 81 MG tablet Take 81 mg by mouth daily.  . cetirizine (ZYRTEC) 10 MG tablet Take 10 mg by mouth daily.  . ferrous sulfate (FERROUSUL) 325 (65 FE) MG tablet Take 1 tablet (325 mg total) by mouth 2 (two) times daily with a meal.  . glipiZIDE (GLUCOTROL) 10 MG tablet Take 10 mg by mouth 2 (two) times daily.  Marland Kitchen levothyroxine (SYNTHROID, LEVOTHROID) 50 MCG tablet Take 50 mcg by mouth daily before breakfast.  . lidocaine-prilocaine (EMLA) cream Apply to affected area once  . loperamide (IMODIUM) 2 MG capsule Take 2 capsules (4 mg total) by mouth 4 (four) times daily -  before meals and at bedtime.  . metoprolol tartrate (LOPRESSOR) 25 MG tablet Take 1 tablet (25 mg total) by mouth 2 (two) times daily.  . predniSONE (STERAPRED UNI-PAK 21 TAB) 5 MG (21) TBPK tablet See admin instructions.  . prochlorperazine (COMPAZINE) 10 MG tablet Take 1 tablet (10 mg total) by mouth every 6 (six) hours as needed (Nausea or vomiting).  . psyllium (HYDROCIL/METAMUCIL) 95 % PACK Take 1 packet by mouth daily.  . tamsulosin (FLOMAX) 0.4 MG CAPS capsule Take 0.4 mg by mouth at bedtime.   . triamcinolone cream (KENALOG) 0.1 % APPLY CREAM EXTERNALLY TWICE DAILY  . vitamin C (ASCORBIC ACID) 500 MG tablet Take 1 tablet (500 mg total) by mouth daily.   Facility-Administered Encounter Medications as of 01/02/2019  Medication  . iothalamate  meglumine (CYSTO-CONRAY II) 17.2 % solution 250 mL    ALLERGIES:  No Known Allergies   PHYSICAL EXAM:  ECOG Performance status: 1 Blood pressure 118/74.  Pulse rate is 61.  Respiratory is 18.  Temperature 98.  Saturations 100%. Physical Exam Vitals signs reviewed.  Constitutional:      Appearance: Normal appearance.  Cardiovascular:     Rate and Rhythm: Normal rate and regular rhythm.     Heart sounds: Normal heart sounds.  Pulmonary:     Effort: Pulmonary effort is normal.     Breath sounds: Normal breath sounds.  Abdominal:     General: There is no distension.     Palpations: Abdomen is soft. There is no mass.  Musculoskeletal:        General: No swelling.  Skin:    General: Skin is warm.  Neurological:     General: No focal deficit present.     Mental Status: He is alert and oriented to person, place, and time.  Psychiatric:        Mood and Affect: Mood normal.        Behavior: Behavior normal.      LABORATORY DATA:  I have reviewed the labs as listed.  CBC    Component Value Date/Time   WBC 4.1 01/02/2019 0802   RBC 3.62 (L) 01/02/2019 0802   HGB 12.6 (L) 01/02/2019 0802   HGB 11.8 (L) 09/14/2018 1514   HCT 37.0 (L) 01/02/2019 0802   HCT 38.4 09/14/2018 1514   PLT 93 (L) 01/02/2019 0802   PLT 468 (H) 09/14/2018 1514   MCV 102.2 (H) 01/02/2019 0802   MCV 87 09/14/2018 1514   MCH 34.8 (H) 01/02/2019 0802   MCHC 34.1 01/02/2019 0802   RDW 19.5 (H) 01/02/2019 0802   RDW 14.3 09/14/2018 1514   LYMPHSABS 1.1 01/02/2019 0802   LYMPHSABS 1.8 09/14/2018 1514   MONOABS 0.6 01/02/2019 0802  EOSABS 0.1 01/02/2019 0802   EOSABS 0.6 (H) 09/14/2018 1514   BASOSABS 0.1 01/02/2019 0802   BASOSABS 0.1 09/14/2018 1514   CMP Latest Ref Rng & Units 01/02/2019 12/18/2018 12/04/2018  Glucose 70 - 99 mg/dL 220(H) 200(H) 191(H)  BUN 8 - 23 mg/dL _0 Creatinine 0.61 - 1.24 mg/dL 0.96 1.04 0.93  Sodium 135 - 145 mmol/L 139 136 136  Potassium 3.5 - 5.1 mmol/L 4.0 4.3  4.0  Chloride 98 - 111 mmol/L 106 103 104  CO2 22 - 32 mmol/L _1 Calcium 8.9 - 10.3 mg/dL 9.2 9.2 9.1  Total Protein 6.5 - 8.1 g/dL 6.6 6.7 6.6  Total Bilirubin 0.3 - 1.2 mg/dL 0.5 0.7 0.6  Alkaline Phos 38 - 126 U/L 101 98 105  AST 15 - 41 U/L 35 35 33  ALT 0 - 44 U/L _2 DIAGNOSTIC IMAGING:  I have independently reviewed the scans and discussed with the patient.   I have reviewed Venita Lick LPN's note and agree with the documentation.  I personally performed a face-to-face visit, made revisions and my assessment and plan is as follows.    ASSESSMENT & PLAN:   Cancer of transverse colon ([place]) 1.  Stage IIb (T4AN0) transverse colon adenocarcinoma, MSI-stable: - He had a history of colon cancer in June 2008, status post resection and did not require any adjuvant therapy. - He had a recurrence in February 2009, underwent resection with colostomy.  This was followed by 6 months of FOLFOX based chemotherapy sandwiching radiation. - He noticed blood in the colostomy bag.  He underwent colonoscopy on 08/25/2018 which showed malignant partially obstructing tumor in the mid transverse colon with active bleeding.  Other parts of the colon was normal.  Biopsy showed adenocarcinoma.  - Completion colectomy with end ileostomy, lysis of adhesions and bladder repair on 08/29/2018 by Dr. Constance Haw. - Pathology showed pT4a, PN0, 0/16 lymph nodes positive, MMR normal.  Margins were negative. -PET/CT scan on 09/28/2018 shows no findings of active malignancy.  There is some mild activity associated with left upper quadrant ascites and anterior abdominal wall wound sites, likely inflammatory but potentially meriting surveillance.  Liver appears unremarkable.  CEA was 2.0. -Adjuvant chemotherapy with infusional 5-FU/leucovorin started on 10/09/2018. -Cycle 6 of treatment on 12/18/2018.  -He has some baseline diarrhea and empties his bag 3-4 times per day.  He is taking 4 tablets of Imodium  daily since surgery. - We reviewed his blood work which is acceptable for his next cycle of treatment today. -He has mild leukopenia and thrombocytopenia which have been stable. -We will come back in 2 weeks for follow-up and next treatment.  2.  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 -Numbness in the fingertips is slightly worse than the feet and has been stable.  He was treated with gabapentin in the past.         Total time spent is 25 minutes with more than 50% of the time spent face-to-face discussing treatment plan, side effects and coordination of care.    Orders placed this encounter:  No orders of the defined types were placed in this encounter.     Derek Jack, MD [place] [phone]

## 2019-01-02 NOTE — Patient Instructions (Signed)
[facility] Discharge Instructions for Patients Receiving Chemotherapy   Beginning January 23rd 2017 lab work for the Cancer Center will be done in the  Main lab at [facility] on 1st floor. If you have a lab appointment with the Cancer Center please come in thru the  Main Entrance and check in at the main information desk   Today you received the following chemotherapy agents Leucovorin and 5FU. Follow-up as scheduled. Call clinic for any questions or concerns  To help prevent nausea and vomiting after your treatment, we encourage you to take your nausea medication   If you develop nausea and vomiting, or diarrhea that is not controlled by your medication, call the clinic.  The clinic phone number is [phone]. Office hours are Monday-Friday 8:30am-5:00pm.  BELOW ARE SYMPTOMS THAT SHOULD BE REPORTED IMMEDIATELY:  *FEVER GREATER THAN 101.0 F  *CHILLS WITH OR WITHOUT FEVER  NAUSEA AND VOMITING THAT IS NOT CONTROLLED WITH YOUR NAUSEA MEDICATION  *UNUSUAL SHORTNESS OF BREATH  *UNUSUAL BRUISING OR BLEEDING  TENDERNESS IN MOUTH AND THROAT WITH OR WITHOUT PRESENCE OF ULCERS  *URINARY PROBLEMS  *BOWEL PROBLEMS  UNUSUAL RASH Items with * indicate a potential emergency and should be followed up as soon as possible. If you have an emergency after office hours please contact your primary care physician or go to the nearest emergency department.  Please call the clinic during office hours if you have any questions or concerns.   You may also contact the Patient Navigator at [phone] should you have any questions or need assistance in obtaining follow up care.      Resources For Cancer Patients and their Caregivers ? American Cancer Society: Can assist with transportation, wigs, general needs, runs Look Good Feel Better.        1-888-227-6333 ? Cancer Care: Provides financial assistance, online support groups, medication/co-pay assistance.   1-800-813-HOPE (4673) ? [facility] Assists [place] cancer patients and their families through emotional , educational and financial support.  336-427-4357 ? [place] DSS Where to apply for food stamps, Medicaid and utility assistance. 336-342-1394 ? [facility]: Transportation to medical appointments. 336-347-2287 ? Social Security Administration: May apply for disability if have a Stage IV cancer. 336-342-7796 1-800-772-1213 ? [place] Aging, Disability and Transit Services: Assists with nutrition, care and transit needs. 336-349-2343         

## 2019-01-02 NOTE — Progress Notes (Signed)
[id number] Labs reviewed with and pt seen by Dr. Larey Seat and pt approved for chemo tx today per MD                                                                                     [place] tolerated chemo tx well without complaints or incident. VSS upon discharge. Pt discharged with 5FU pump infusing without issues. Pt discharged self ambulatory in satisfactory condition

## 2019-01-02 NOTE — Patient Instructions (Signed)
[facility] at [facility] Discharge Instructions  Labs drawn from portacath today   Thank you for choosing [facility] at [facility] to provide your oncology and hematology care.  To afford each patient quality time with our provider, please arrive at least 15 minutes before your scheduled appointment time.   If you have a lab appointment with the Cancer Center please come in thru the  Main Entrance and check in at the main information desk  You need to re-schedule your appointment should you arrive 10 or more minutes late.  We strive to give you quality time with our providers, and arriving late affects you and other patients whose appointments are after yours.  Also, if you no show three or more times for appointments you may be dismissed from the clinic at the providers discretion.     Again, thank you for choosing [facility].  Our hope is that these requests will decrease the amount of time that you wait before being seen by our physicians.       _____________________________________________________________  Should you have questions after your visit to [facility], please contact our office at [phone] between the hours of 8:00 a.m. and 4:30 p.m.  Voicemails left after 4:00 p.m. will not be returned until the following business day.  For prescription refill requests, have your pharmacy contact our office and allow 72 hours.    Cancer Center Support Programs:   > Cancer Support Group  2nd Tuesday of the month 1pm-2pm, Journey Room   

## 2019-01-02 NOTE — Assessment & Plan Note (Signed)
1.  Stage IIb (T4AN0) transverse colon adenocarcinoma, MSI-stable: - He had a history of colon cancer in June 2008, status post resection and did not require any adjuvant therapy. - He had a recurrence in February 2009, underwent resection with colostomy.  This was followed by 6 months of FOLFOX based chemotherapy sandwiching radiation. - He noticed blood in the colostomy bag.  He underwent colonoscopy on 08/25/2018 which showed malignant partially obstructing tumor in the mid transverse colon with active bleeding.  Other parts of the colon was normal.  Biopsy showed adenocarcinoma.  - Completion colectomy with end ileostomy, lysis of adhesions and bladder repair on 08/29/2018 by Dr. Constance Haw. - Pathology showed pT4a, PN0, 0/16 lymph nodes positive, MMR normal.  Margins were negative. -PET/CT scan on 09/28/2018 shows no findings of active malignancy.  There is some mild activity associated with left upper quadrant ascites and anterior abdominal wall wound sites, likely inflammatory but potentially meriting surveillance.  Liver appears unremarkable.  CEA was 2.0. -Adjuvant chemotherapy with infusional 5-FU/leucovorin started on 10/09/2018. -Cycle 6 of treatment on 12/18/2018.  -He has some baseline diarrhea and empties his bag 3-4 times per day.  He is taking 4 tablets of Imodium daily since surgery. - We reviewed his blood work which is acceptable for his next cycle of treatment today. -He has mild leukopenia and thrombocytopenia which have been stable. -We will come back in 2 weeks for follow-up and next treatment.  2.  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 -Numbness in the fingertips is slightly worse than the feet and has been stable.  He was treated with gabapentin in the past.

## 2019-01-04 ENCOUNTER — Inpatient Hospital Stay (HOSPITAL_COMMUNITY): Payer: Medicare Other

## 2019-01-04 ENCOUNTER — Other Ambulatory Visit: Payer: Self-pay

## 2019-01-04 ENCOUNTER — Encounter (HOSPITAL_COMMUNITY): Payer: Self-pay

## 2019-01-04 VITALS — BP 124/78 | HR 65 | Temp 98.3°F | Resp 18

## 2019-01-04 DIAGNOSIS — Z85038 Personal history of other malignant neoplasm of large intestine: Secondary | ICD-10-CM | POA: Diagnosis not present

## 2019-01-04 DIAGNOSIS — D696 Thrombocytopenia, unspecified: Secondary | ICD-10-CM | POA: Diagnosis not present

## 2019-01-04 DIAGNOSIS — C184 Malignant neoplasm of transverse colon: Secondary | ICD-10-CM

## 2019-01-04 DIAGNOSIS — D72819 Decreased white blood cell count, unspecified: Secondary | ICD-10-CM | POA: Diagnosis not present

## 2019-01-04 DIAGNOSIS — Z5111 Encounter for antineoplastic chemotherapy: Secondary | ICD-10-CM | POA: Diagnosis not present

## 2019-01-04 DIAGNOSIS — Z9221 Personal history of antineoplastic chemotherapy: Secondary | ICD-10-CM | POA: Diagnosis not present

## 2019-01-04 MED ORDER — HEPARIN SOD (PORK) LOCK FLUSH 100 UNIT/ML IV SOLN
500.0000 [IU] | Freq: Once | INTRAVENOUS | Status: AC | PRN
  Administered 2019-01-04: 500 [IU]

## 2019-01-04 MED ORDER — SODIUM CHLORIDE 0.9% FLUSH
10.0000 mL | INTRAVENOUS | Status: DC | PRN
  Administered 2019-01-04: 10 mL
  Filled 2019-01-04: qty 10

## 2019-01-04 NOTE — Patient Instructions (Signed)
[place] at [facility] Discharge Instructions  5FU pump discontinued with port flushed per protocol. Follow-up as scheduled. Call clinic for any questions or concerns   Thank you for choosing [place] at [facility] to provide your oncology and hematology care.  To afford each patient quality time with our provider, please arrive at least 15 minutes before your scheduled appointment time.   If you have a lab appointment with the [place] please come in thru the  Main Entrance and check in at the main information desk  You need to re-schedule your appointment should you arrive 10 or more minutes late.  We strive to give you quality time with our providers, and arriving late affects you and other patients whose appointments are after yours.  Also, if you no show three or more times for appointments you may be dismissed from the clinic at the providers discretion.     Again, thank you for choosing [facility].  Our hope is that these requests will decrease the amount of time that you wait before being seen by our physicians.       _____________________________________________________________  Should you have questions after your visit to [facility], please contact our office at [phone] between the hours of 8:00 a.m. and 4:30 p.m.  Voicemails left after 4:00 p.m. will not be returned until the following business day.  For prescription refill requests, have your pharmacy contact our office and allow 72 hours.    Cancer Center Support Programs:   > Cancer Support Group  2nd Tuesday of the month 1pm-2pm, Journey Room

## 2019-01-04 NOTE — Progress Notes (Signed)
Jose Lawrence tolerated 5FU pump well without complaints or incident. 5FU pump discontinued with port flushed easily per protocol then de-accessed. VSS Pt discharged self ambulatory in satisfactory condition

## 2019-01-15 ENCOUNTER — Encounter (HOSPITAL_COMMUNITY): Payer: Self-pay | Admitting: Hematology

## 2019-01-15 ENCOUNTER — Other Ambulatory Visit: Payer: Self-pay

## 2019-01-15 ENCOUNTER — Inpatient Hospital Stay (HOSPITAL_COMMUNITY): Payer: Medicare Other | Attending: Hematology

## 2019-01-15 ENCOUNTER — Inpatient Hospital Stay (HOSPITAL_COMMUNITY): Payer: Medicare Other

## 2019-01-15 ENCOUNTER — Inpatient Hospital Stay (HOSPITAL_BASED_OUTPATIENT_CLINIC_OR_DEPARTMENT_OTHER): Payer: Medicare Other | Admitting: Hematology

## 2019-01-15 VITALS — BP 126/72 | HR 65 | Temp 97.6°F | Resp 18

## 2019-01-15 VITALS — BP 117/59 | HR 57 | Temp 97.5°F | Resp 18 | Wt 190.8 lb

## 2019-01-15 DIAGNOSIS — Z9049 Acquired absence of other specified parts of digestive tract: Secondary | ICD-10-CM | POA: Insufficient documentation

## 2019-01-15 DIAGNOSIS — R531 Weakness: Secondary | ICD-10-CM | POA: Diagnosis not present

## 2019-01-15 DIAGNOSIS — Z932 Ileostomy status: Secondary | ICD-10-CM | POA: Diagnosis not present

## 2019-01-15 DIAGNOSIS — D6959 Other secondary thrombocytopenia: Secondary | ICD-10-CM | POA: Diagnosis not present

## 2019-01-15 DIAGNOSIS — Z87891 Personal history of nicotine dependence: Secondary | ICD-10-CM | POA: Insufficient documentation

## 2019-01-15 DIAGNOSIS — C184 Malignant neoplasm of transverse colon: Secondary | ICD-10-CM | POA: Diagnosis not present

## 2019-01-15 DIAGNOSIS — Z923 Personal history of irradiation: Secondary | ICD-10-CM | POA: Diagnosis not present

## 2019-01-15 DIAGNOSIS — Z5111 Encounter for antineoplastic chemotherapy: Secondary | ICD-10-CM | POA: Insufficient documentation

## 2019-01-15 DIAGNOSIS — G620 Drug-induced polyneuropathy: Secondary | ICD-10-CM | POA: Insufficient documentation

## 2019-01-15 DIAGNOSIS — T451X5A Adverse effect of antineoplastic and immunosuppressive drugs, initial encounter: Secondary | ICD-10-CM | POA: Insufficient documentation

## 2019-01-15 DIAGNOSIS — Z9221 Personal history of antineoplastic chemotherapy: Secondary | ICD-10-CM | POA: Diagnosis not present

## 2019-01-15 LAB — CBC WITH DIFFERENTIAL/PLATELET
Abs Immature Granulocytes: 0.01 10*3/uL (ref 0.00–0.07)
Basophils Absolute: 0 10*3/uL (ref 0.0–0.1)
Basophils Relative: 1 %
Eosinophils Absolute: 0.2 10*3/uL (ref 0.0–0.5)
Eosinophils Relative: 4 %
HCT: 36.2 % — ABNORMAL LOW (ref 39.0–52.0)
Hemoglobin: 12.2 g/dL — ABNORMAL LOW (ref 13.0–17.0)
Immature Granulocytes: 0 %
Lymphocytes Relative: 28 %
Lymphs Abs: 1.2 10*3/uL (ref 0.7–4.0)
MCH: 35.2 pg — ABNORMAL HIGH (ref 26.0–34.0)
MCHC: 33.7 g/dL (ref 30.0–36.0)
MCV: 104.3 fL — ABNORMAL HIGH (ref 80.0–100.0)
Monocytes Absolute: 0.5 10*3/uL (ref 0.1–1.0)
Monocytes Relative: 12 %
Neutro Abs: 2.3 10*3/uL (ref 1.7–7.7)
Neutrophils Relative %: 55 %
Platelets: 90 10*3/uL — ABNORMAL LOW (ref 150–400)
RBC: 3.47 MIL/uL — ABNORMAL LOW (ref 4.22–5.81)
RDW: 17.8 % — ABNORMAL HIGH (ref 11.5–15.5)
WBC: 4.2 10*3/uL (ref 4.0–10.5)
nRBC: 0 % (ref 0.0–0.2)

## 2019-01-15 LAB — COMPREHENSIVE METABOLIC PANEL
ALT: 23 U/L (ref 0–44)
AST: 29 U/L (ref 15–41)
Albumin: 3.5 g/dL (ref 3.5–5.0)
Alkaline Phosphatase: 73 U/L (ref 38–126)
Anion gap: 9 (ref 5–15)
BUN: 10 mg/dL (ref 8–23)
CO2: 23 mmol/L (ref 22–32)
Calcium: 9.2 mg/dL (ref 8.9–10.3)
Chloride: 104 mmol/L (ref 98–111)
Creatinine, Ser: 0.99 mg/dL (ref 0.61–1.24)
GFR calc Af Amer: >60 mL/min (ref >60)
GFR calc non Af Amer: >60 mL/min (ref >60)
Glucose, Bld: 182 mg/dL — ABNORMAL HIGH (ref 70–99)
Potassium: 4.1 mmol/L (ref 3.5–5.1)
Sodium: 136 mmol/L (ref 135–145)
Total Bilirubin: 0.9 mg/dL (ref 0.3–1.2)
Total Protein: 6.5 g/dL (ref 6.5–8.1)

## 2019-01-15 MED ORDER — FLUOROURACIL CHEMO INJECTION 2.5 GM/50ML
400.0000 mg/m2 | Freq: Once | INTRAVENOUS | Status: AC
  Administered 2019-01-15: 800 mg via INTRAVENOUS
  Filled 2019-01-15: qty 16

## 2019-01-15 MED ORDER — SODIUM CHLORIDE 0.9 % IV SOLN
400.0000 mg/m2 | Freq: Once | INTRAVENOUS | Status: AC
  Administered 2019-01-15: 820 mg via INTRAVENOUS
  Filled 2019-01-15: qty 10

## 2019-01-15 MED ORDER — SODIUM CHLORIDE 0.9 % IV SOLN
Freq: Once | INTRAVENOUS | Status: AC
  Administered 2019-01-15: 10:00:00 via INTRAVENOUS

## 2019-01-15 MED ORDER — SODIUM CHLORIDE 0.9 % IV SOLN
8.0000 mg | Freq: Once | INTRAVENOUS | Status: AC
  Administered 2019-01-15: 8 mg via INTRAVENOUS
  Filled 2019-01-15: qty 4

## 2019-01-15 MED ORDER — SODIUM CHLORIDE 0.9 % IV SOLN
2430.0000 mg/m2 | INTRAVENOUS | Status: DC
  Administered 2019-01-15: 5000 mg via INTRAVENOUS
  Filled 2019-01-15: qty 100

## 2019-01-15 MED ORDER — SODIUM CHLORIDE 0.9% FLUSH: 10.0000 mL | INTRAVENOUS | Status: DC | PRN

## 2019-01-15 NOTE — Patient Instructions (Signed)
[facility] Discharge Instructions for Patients Receiving Chemotherapy  Today you received the following chemotherapy agents   To help prevent nausea and vomiting after your treatment, we encourage you to take your nausea medication   If you develop nausea and vomiting that is not controlled by your nausea medication, call the clinic.   BELOW ARE SYMPTOMS THAT SHOULD BE REPORTED IMMEDIATELY:  *FEVER GREATER THAN 100.5 F  *CHILLS WITH OR WITHOUT FEVER  NAUSEA AND VOMITING THAT IS NOT CONTROLLED WITH YOUR NAUSEA MEDICATION  *UNUSUAL SHORTNESS OF BREATH  *UNUSUAL BRUISING OR BLEEDING  TENDERNESS IN MOUTH AND THROAT WITH OR WITHOUT PRESENCE OF ULCERS  *URINARY PROBLEMS  *BOWEL PROBLEMS  UNUSUAL RASH Items with * indicate a potential emergency and should be followed up as soon as possible.  Feel free to call the clinic should you have any questions or concerns. The clinic phone number is [phone].  Please show the CHEMO ALERT CARD at check-in to the Emergency Department and triage nurse.   

## 2019-01-15 NOTE — Assessment & Plan Note (Signed)
1.  Stage IIb (T4AN0) transverse colon adenocarcinoma, MSI-stable: - He had a history of colon cancer in June 2008, status post resection and did not require any adjuvant therapy. - He had a recurrence in February 2009, underwent resection with colostomy.  This was followed by 6 months of FOLFOX based chemotherapy sandwiching radiation. - He noticed blood in the colostomy bag.  He underwent colonoscopy on 08/25/2018 which showed malignant partially obstructing tumor in the mid transverse colon with active bleeding.  Other parts of the colon was normal.  Biopsy showed adenocarcinoma.  - Completion colectomy with end ileostomy, lysis of adhesions and bladder repair on 08/29/2018 by Dr. Constance Haw. - Pathology showed pT4a, PN0, 0/16 lymph nodes positive, MMR normal.  Margins were negative. -PET/CT scan on 09/28/2018 shows no findings of active malignancy.  There is some mild activity associated with left upper quadrant ascites and anterior abdominal wall wound sites, likely inflammatory but potentially meriting surveillance.  Liver appears unremarkable.  CEA was 2.0. -Adjuvant chemotherapy with infusional 5-FU/leucovorin started on 10/09/2018. -Cycle 7 chemotherapy on 01/02/2019. -He has some baseline diarrhea and empties his bag 3-4 times per day.  He takes up to 4 tablets of Imodium daily since surgery. - He has mild thrombocytopenia which is also stable.  He had tiredness and mouth sores lasting 2 to 3 days after the last cycle of chemotherapy. - I have reviewed his blood work today.  Adequate to proceed with next cycle. -We will see him back in 2 weeks for follow-up.  2.  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 -Numbness in the fingertips is slightly worse than the feet and has been stable.  He was treated with gabapentin in the past.

## 2019-01-15 NOTE — Patient Instructions (Signed)
[facility] at [facility] Discharge Instructions  Labs drawn from portacath today   Thank you for choosing [facility] at [facility] to provide your oncology and hematology care.  To afford each patient quality time with our provider, please arrive at least 15 minutes before your scheduled appointment time.   If you have a lab appointment with the Cancer Center please come in thru the  Main Entrance and check in at the main information desk  You need to re-schedule your appointment should you arrive 10 or more minutes late.  We strive to give you quality time with our providers, and arriving late affects you and other patients whose appointments are after yours.  Also, if you no show three or more times for appointments you may be dismissed from the clinic at the providers discretion.     Again, thank you for choosing [facility].  Our hope is that these requests will decrease the amount of time that you wait before being seen by our physicians.       _____________________________________________________________  Should you have questions after your visit to [facility], please contact our office at [phone] between the hours of 8:00 a.m. and 4:30 p.m.  Voicemails left after 4:00 p.m. will not be returned until the following business day.  For prescription refill requests, have your pharmacy contact our office and allow 72 hours.    Cancer Center Support Programs:   > Cancer Support Group  2nd Tuesday of the month 1pm-2pm, Journey Room   

## 2019-01-15 NOTE — Progress Notes (Signed)
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[place])    09/29/2018 Cancer Staging    Staging form: Colon and Rectum, AJCC 8th Edition - Clinical stage from 09/29/2018: Stage IIB (cT4a, cN0, cM0) - Signed by Derek Jack, MD on 09/29/2018    10/09/2018 -  Chemotherapy    The patient had ondansetron (ZOFRAN) 8 mg in sodium chloride 0.9 % 50 mL IVPB, 8 mg (100 % of original dose 8 mg), Intravenous,  Once, 8 of 12 cycles Dose modification: 8 mg (original dose 8 mg, Cycle 1) Administration: 8 mg (10/09/2018), 8 mg (10/23/2018), 8 mg (11/06/2018), 8 mg (11/20/2018), 8 mg (12/04/2018), 8 mg (12/18/2018), 8 mg (01/02/2019) leucovorin 800 mg in dextrose 5 % 250 mL infusion, 820 mg, Intravenous,  Once, 8 of 12 cycles Administration: 800 mg (10/09/2018), 820 mg (10/23/2018), 820 mg (11/06/2018), 820 mg (11/20/2018), 820 mg (12/04/2018), 820 mg (12/18/2018), 820 mg (01/02/2019) fluorouracil (ADRUCIL) chemo injection 800 mg, 400 mg/m2 = 800 mg, Intravenous,  Once, 8 of 12 cycles Administration: 800 mg (10/09/2018), 800 mg (10/23/2018), 800 mg (11/06/2018), 800 mg (11/20/2018), 800 mg (12/04/2018), 800 mg (12/18/2018), 800 mg (01/02/2019) fluorouracil (ADRUCIL) 4,900 mg in sodium chloride 0.9 % 52 mL chemo infusion, 2,400 mg/m2 = 4,900 mg, Intravenous, 1 Day/Dose, 8 of 12 cycles Administration: 4,900 mg (10/09/2018), 4,900 mg (10/23/2018), 5,000 mg (11/06/2018), 5,000 mg (11/20/2018), 5,000 mg (12/04/2018), 5,000 mg (12/18/2018), 5,000 mg (01/02/2019)  for chemotherapy treatment.       CANCER STAGING: Cancer Staging Cancer of transverse colon [facility]) Staging form: Colon and Rectum, AJCC 8th Edition - Clinical stage from  09/29/2018: Stage IIB (cT4a, cN0, cM0) - Signed by Derek Jack, MD on 09/29/2018    INTERVAL HISTORY:  Mr. Marty 78 y.o. male returns for routine follow-up and consideration for next cycle of chemotherapy.  He has baseline diarrhea.  Denies any nausea or vomiting.  He takes up to 4 tablets of Imodium daily.  Had tiredness for 2 to 3 days after last treatment.  He also had mouth sores lasting 2 to 3 days.  Denies any new pains. Had not noticed any recent bleeding such as epistaxis, hematuria or hematochezia. Denies recent chest pain on exertion, shortness of breath on minimal exertion, pre-syncopal episodes, or palpitations.  Numbness in the hands and feet has been stable.  Denies any recent fevers, infections, or recent hospitalizations. Patient reports appetite at 100 % and energy level at 90 %.      REVIEW OF SYSTEMS:  Review of Systems  HENT:   Positive for mouth sores.   Skin: Positive for rash.  Neurological: Positive for numbness.  All other systems reviewed and are negative.    PAST MEDICAL/SURGICAL HISTORY:  Past Medical History:  Diagnosis Date  . Anemia    Causing interruption in anticoagulation  . Atrial fibrillation and flutter ([place])   . BPH (benign prostatic hypertrophy)   . Colon cancer ([place])    Status post LAR 2008  . Hypothyroidism   . Lacunar infarction ([place]) 11/2012   RT 3rd NERVE PALSY, SB [name]  . PE (pulmonary embolism)    Temporarily on Eliquis 2017  .  Skin cancer    35 years ago  . Type 2 diabetes mellitus ([place])    Past Surgical History:  Procedure Laterality Date  . ABDOMINOPERINEAL PROCTOCOLECTOMY  2009   end colostomy   . BIOPSY  08/25/2018   Procedure: BIOPSY;  Surgeon: Rogene Houston, MD;  Location: [facility] ENDO SUITE;  Service: Endoscopy;;  . BLADDER REPAIR  08/29/2018   Procedure: BLADDER REPAIR;  Surgeon: [name], MD;  Location: [facility] ORS;  Service: General;;  . COLECTOMY  2008  . COLONOSCOPY WITH PROPOFOL N/A 08/25/2018    Procedure: COLONOSCOPY WITH PROPOFOL;  Surgeon: Rogene Houston, MD;  Location: [facility] ENDO SUITE;  Service: Endoscopy;  Laterality: N/A;  2:20  . COLOSTOMY    . EXPLORATORY LAPAROTOMY  2017   ? pneumoperitoneum of unknown etiology/ negative Ex lap  . ILEOSTOMY  08/29/2018   Procedure: ILEOSTOMY;  Surgeon: [name], MD;  Location: [facility] ORS;  Service: General;;  . LAPAROSCOPIC CHOLECYSTECTOMY  2007  . LYSIS OF ADHESION  08/29/2018   Procedure: LYSIS OF ADHESION;  Surgeon: [name], MD;  Location: [facility] ORS;  Service: General;;  . PARTIAL COLECTOMY N/A 08/29/2018   Procedure: PARTIAL COLECTOMY;  Surgeon: [name], MD;  Location: [facility] ORS;  Service: General;  Laterality: N/A;  . PORTACATH PLACEMENT Left 10/04/2018   Procedure: INSERTION PORT-A-CATH (attached catheter left subclavian);  Surgeon: [name], MD;  Location: [facility] ORS;  Service: General;  Laterality: Left;  . SBO SURGERY  06/2009     SOCIAL HISTORY:  Social History   Socioeconomic History  . Marital status: Married    Spouse name: Not on file  . Number of children: Not on file  . Years of education: Not on file  . Highest education level: Not on file  Occupational History  . Occupation: Architect    Comment: [employer]  Social Needs  . Financial resource strain: Not hard at all  . Food insecurity:    Worry: Never true    Inability: Never true  . Transportation needs:    Medical: No    Non-medical: No  Tobacco Use  . Smoking status: Former Smoker    Packs/day: 1.00    Years: 27.00    Pack years: 27.00    Types: Cigarettes    Start date: 01/27/1964    Last attempt to quit: 01/27/1991    Years since quitting: 27.9  . Smokeless tobacco: Former Systems developer    Types: [place] date: 08/10/1995  Substance and Sexual Activity  . Alcohol use: Never    Alcohol/week: 0.0 standard drinks    Frequency: Never  . Drug use: Never  . Sexual activity: Not Currently  Lifestyle  . Physical activity:    Days  per week: 0 days    Minutes per session: 0 min  . Stress: Not at all  Relationships  . Social connections:    Talks on phone: Twice a week    Gets together: Once a week    Attends religious service: Not on file    Active member of club or organization: Not on file    Attends meetings of clubs or organizations: Not on file    Relationship status: Not on file  . Intimate partner violence:    Fear of current or ex partner: No    Emotionally abused: No    Physically abused: No    Forced sexual activity: No  Other Topics Concern  . Not on  file  Social History Narrative  . Not on file    FAMILY HISTORY:  Family History  Problem Relation Age of Onset  . Heart disease Father   . Heart failure Father   . Heart attack Father   . Prostate cancer Paternal Uncle     CURRENT MEDICATIONS:  Outpatient Encounter Medications as of 01/15/2019  Medication Sig  . aspirin 81 MG tablet Take 81 mg by mouth daily.  . cetirizine (ZYRTEC) 10 MG tablet Take 10 mg by mouth daily.  . ferrous sulfate (FERROUSUL) 325 (65 FE) MG tablet Take 1 tablet (325 mg total) by mouth 2 (two) times daily with a meal.  . glipiZIDE (GLUCOTROL) 10 MG tablet Take 10 mg by mouth 2 (two) times daily.  Marland Kitchen levothyroxine (SYNTHROID, LEVOTHROID) 50 MCG tablet Take 50 mcg by mouth daily before breakfast.  . lidocaine-prilocaine (EMLA) cream Apply to affected area once  . loperamide (IMODIUM) 2 MG capsule Take 2 capsules (4 mg total) by mouth 4 (four) times daily -  before meals and at bedtime.  . metoprolol tartrate (LOPRESSOR) 25 MG tablet Take 1 tablet (25 mg total) by mouth 2 (two) times daily.  . predniSONE (STERAPRED UNI-PAK 21 TAB) 5 MG (21) TBPK tablet See admin instructions.  . prochlorperazine (COMPAZINE) 10 MG tablet Take 1 tablet (10 mg total) by mouth every 6 (six) hours as needed (Nausea or vomiting).  . psyllium (HYDROCIL/METAMUCIL) 95 % PACK Take 1 packet by mouth daily.  . tamsulosin (FLOMAX) 0.4 MG CAPS capsule  Take 0.4 mg by mouth at bedtime.   . triamcinolone cream (KENALOG) 0.1 % APPLY CREAM EXTERNALLY TWICE DAILY  . vitamin C (ASCORBIC ACID) 500 MG tablet Take 1 tablet (500 mg total) by mouth daily.   Facility-Administered Encounter Medications as of 01/15/2019  Medication  . iothalamate meglumine (CYSTO-CONRAY II) 17.2 % solution 250 mL    ALLERGIES:  No Known Allergies   PHYSICAL EXAM:  ECOG Performance status: 1  Vitals:   01/15/19 0814  BP: (!) 117/59  Pulse: (!) 57  Resp: 18  Temp: (!) 97.5 F (36.4 C)  SpO2: 100%   Filed Weights   01/15/19 0814  Weight: 190 lb 12.8 oz (86.5 kg)    Physical Exam Vitals signs reviewed.  Constitutional:      Appearance: Normal appearance.  Cardiovascular:     Rate and Rhythm: Normal rate and regular rhythm.     Heart sounds: Normal heart sounds.  Pulmonary:     Effort: Pulmonary effort is normal.     Breath sounds: Normal breath sounds.  Abdominal:     General: There is no distension.     Palpations: Abdomen is soft. There is no mass.  Musculoskeletal:        General: No swelling.  Skin:    General: Skin is warm.  Neurological:     General: No focal deficit present.     Mental Status: He is alert and oriented to person, place, and time.  Psychiatric:        Mood and Affect: Mood normal.        Behavior: Behavior normal.      LABORATORY DATA:  I have reviewed the labs as listed.  CBC    Component Value Date/Time   WBC 4.2 01/15/2019 0812   RBC 3.47 (L) 01/15/2019 0812   HGB 12.2 (L) 01/15/2019 0812   HGB 11.8 (L) 09/14/2018 1514   HCT 36.2 (L) 01/15/2019 0812   HCT 38.4 09/14/2018  1514   PLT 90 (L) 01/15/2019 0812   PLT 468 (H) 09/14/2018 1514   MCV 104.3 (H) 01/15/2019 0812   MCV 87 09/14/2018 1514   MCH 35.2 (H) 01/15/2019 0812   MCHC 33.7 01/15/2019 0812   RDW 17.8 (H) 01/15/2019 0812   RDW 14.3 09/14/2018 1514   LYMPHSABS 1.2 01/15/2019 0812   LYMPHSABS 1.8 09/14/2018 1514   MONOABS 0.5 01/15/2019 0812    EOSABS 0.2 01/15/2019 0812   EOSABS 0.6 (H) 09/14/2018 1514   BASOSABS 0.0 01/15/2019 0812   BASOSABS 0.1 09/14/2018 1514   CMP Latest Ref Rng & Units 01/15/2019 01/02/2019 12/18/2018  Glucose 70 - 99 mg/dL 182(H) 220(H) 200(H)  BUN 8 - 23 mg/dL '10 12 11  '$ Creatinine 0.61 - 1.24 mg/dL 0.99 0.96 1.04  Sodium 135 - 145 mmol/L 136 139 136  Potassium 3.5 - 5.1 mmol/L 4.1 4.0 4.3  Chloride 98 - 111 mmol/L 104 106 103  CO2 22 - 32 mmol/L '23 23 24  '$ Calcium 8.9 - 10.3 mg/dL 9.2 9.2 9.2  Total Protein 6.5 - 8.1 g/dL 6.5 6.6 6.7  Total Bilirubin 0.3 - 1.2 mg/dL 0.9 0.5 0.7  Alkaline Phos 38 - 126 U/L 73 101 98  AST 15 - 41 U/L 29 35 35  ALT 0 - 44 U/L '23 31 22       '$ DIAGNOSTIC IMAGING:  I have independently reviewed the scans and discussed with the patient.   I have reviewed Venita Lick LPN's note and agree with the documentation.  I personally performed a face-to-face visit, made revisions and my assessment and plan is as follows.    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  Stage IIb (T4AN0) transverse colon adenocarcinoma, MSI-stable: - He had a history of colon cancer in June 2008, status post resection and did not require any adjuvant therapy. - He had a recurrence in February 2009, underwent resection with colostomy.  This was followed by 6 months of FOLFOX based chemotherapy sandwiching radiation. - He noticed blood in the colostomy bag.  He underwent colonoscopy on 08/25/2018 which showed malignant partially obstructing tumor in the mid transverse colon with active bleeding.  Other parts of the colon was normal.  Biopsy showed adenocarcinoma.  - Completion colectomy with end ileostomy, lysis of adhesions and bladder repair on 08/29/2018 by Dr. Constance Haw. - Pathology showed pT4a, PN0, 0/16 lymph nodes positive, MMR normal.  Margins were negative. -PET/CT scan on 09/28/2018 shows no findings of active malignancy.  There is some mild activity associated with left upper  quadrant ascites and anterior abdominal wall wound sites, likely inflammatory but potentially meriting surveillance.  Liver appears unremarkable.  CEA was 2.0. -Adjuvant chemotherapy with infusional 5-FU/leucovorin started on 10/09/2018. -Cycle 7 chemotherapy on 01/02/2019. -He has some baseline diarrhea and empties his bag 3-4 times per day.  He takes up to 4 tablets of Imodium daily since surgery. - He has mild thrombocytopenia which is also stable.  He had tiredness and mouth sores lasting 2 to 3 days after the last cycle of chemotherapy. - I have reviewed his blood work today.  Adequate to proceed with next cycle. -We will see him back in 2 weeks for follow-up.  2.  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 -Numbness in the fingertips is slightly worse than the feet and has been stable.  He was treated with gabapentin in the past.  Total time spent is 25 minutes with more than 50% of the time spent face-to-face discussing treatment plan and coordination of care.    Orders placed this encounter:  Orders Placed This Encounter  Procedures  . CBC with Differential/Platelet  . Comprehensive metabolic panel  . Magnesium      Derek Jack, MD [place] [phone]

## 2019-01-15 NOTE — Patient Instructions (Addendum)
[facility] at [facility] Discharge Instructions  You were seen today by Dr. Katragadda. He went over your recent lab results. He will see you back in 2 weeks for labs and follow up.   Thank you for choosing [facility] at [facility] to provide your oncology and hematology care.  To afford each patient quality time with our provider, please arrive at least 15 minutes before your scheduled appointment time.   If you have a lab appointment with the Cancer Center please come in thru the  Main Entrance and check in at the main information desk  You need to re-schedule your appointment should you arrive 10 or more minutes late.  We strive to give you quality time with our providers, and arriving late affects you and other patients whose appointments are after yours.  Also, if you no show three or more times for appointments you may be dismissed from the clinic at the providers discretion.     Again, thank you for choosing [facility].  Our hope is that these requests will decrease the amount of time that you wait before being seen by our physicians.       _____________________________________________________________  Should you have questions after your visit to [facility], please contact our office at [phone] between the hours of 8:00 a.m. and 4:30 p.m.  Voicemails left after 4:00 p.m. will not be returned until the following business day.  For prescription refill requests, have your pharmacy contact our office and allow 72 hours.    Cancer Center Support Programs:   > Cancer Support Group  2nd Tuesday of the month 1pm-2pm, Journey Room    

## 2019-01-15 NOTE — Progress Notes (Signed)
Pt presents today for physician visit and treatment. VSS. Pt has no complaints of pain or changes since the last visit. MAR reviewed. Pt has a rash bilateral arms that pt states has not changes since the last visit. .   Treatment given today per MD orders. Tolerated infusion without adverse affects. Vital signs stable. No complaints at this time. 5FU pump running per protocol. Unclamped. RUN noted on pump screen. Discharged from clinic ambulatory. F/U with [facility] as scheduled.

## 2019-01-17 ENCOUNTER — Inpatient Hospital Stay (HOSPITAL_COMMUNITY): Payer: Medicare Other

## 2019-01-17 ENCOUNTER — Encounter (HOSPITAL_COMMUNITY): Payer: Self-pay

## 2019-01-17 ENCOUNTER — Other Ambulatory Visit: Payer: Self-pay

## 2019-01-17 VITALS — BP 121/70 | HR 79 | Temp 98.4°F | Resp 18

## 2019-01-17 DIAGNOSIS — Z87891 Personal history of nicotine dependence: Secondary | ICD-10-CM | POA: Diagnosis not present

## 2019-01-17 DIAGNOSIS — T451X5A Adverse effect of antineoplastic and immunosuppressive drugs, initial encounter: Secondary | ICD-10-CM | POA: Diagnosis not present

## 2019-01-17 DIAGNOSIS — Z5111 Encounter for antineoplastic chemotherapy: Secondary | ICD-10-CM | POA: Diagnosis not present

## 2019-01-17 DIAGNOSIS — C184 Malignant neoplasm of transverse colon: Secondary | ICD-10-CM | POA: Diagnosis not present

## 2019-01-17 DIAGNOSIS — G620 Drug-induced polyneuropathy: Secondary | ICD-10-CM | POA: Diagnosis not present

## 2019-01-17 DIAGNOSIS — D6959 Other secondary thrombocytopenia: Secondary | ICD-10-CM | POA: Diagnosis not present

## 2019-01-17 MED ORDER — SODIUM CHLORIDE 0.9% FLUSH
10.0000 mL | INTRAVENOUS | Status: DC | PRN
  Administered 2019-01-17: 10 mL
  Filled 2019-01-17: qty 10

## 2019-01-17 MED ORDER — HEPARIN SOD (PORK) LOCK FLUSH 100 UNIT/ML IV SOLN
500.0000 [IU] | Freq: Once | INTRAVENOUS | Status: AC | PRN
  Administered 2019-01-17: 500 [IU]

## 2019-01-17 NOTE — Progress Notes (Signed)
Jose Lawrence returns today for port de access and flush after 46 hr continous infusion of 29fu. Tolerated infusion without problems. Portacath located left chest wall was  deaccessed and flushed with 19ml NS and 500U/27ml Heparin and needle removed intact.  Procedure without incident. Patient tolerated procedure well.  Vitals stable and discharged home from clinic ambulatory. Follow up as scheduled.

## 2019-01-17 NOTE — Patient Instructions (Signed)
[facility] at [facility]  Discharge Instructions:   _______________________________________________________________  Thank you for choosing [facility] at [facility] to provide your oncology and hematology care.  To afford each patient quality time with our providers, please arrive at least 15 minutes before your scheduled appointment.  You need to re-schedule your appointment if you arrive 10 or more minutes late.  We strive to give you quality time with our providers, and arriving late affects you and other patients whose appointments are after yours.  Also, if you no show three or more times for appointments you may be dismissed from the clinic.  Again, thank you for choosing [facility] at [facility]. Our hope is that these requests will allow you access to exceptional care and in a timely manner. _______________________________________________________________  If you have questions after your visit, please contact our office at [phone] between the hours of 8:30 a.m. and 5:00 p.m. Voicemails left after 4:30 p.m. will not be returned until the following business day. _______________________________________________________________  For prescription refill requests, have your pharmacy contact our office. _______________________________________________________________  Recommendations made by the consultant and any test results will be sent to your referring physician. _______________________________________________________________ 

## 2019-01-25 DIAGNOSIS — Z6826 Body mass index (BMI) 26.0-26.9, adult: Secondary | ICD-10-CM | POA: Diagnosis not present

## 2019-01-25 DIAGNOSIS — I2699 Other pulmonary embolism without acute cor pulmonale: Secondary | ICD-10-CM | POA: Diagnosis not present

## 2019-01-25 DIAGNOSIS — I4891 Unspecified atrial fibrillation: Secondary | ICD-10-CM | POA: Diagnosis not present

## 2019-01-25 DIAGNOSIS — E1165 Type 2 diabetes mellitus with hyperglycemia: Secondary | ICD-10-CM | POA: Diagnosis not present

## 2019-01-25 DIAGNOSIS — C189 Malignant neoplasm of colon, unspecified: Secondary | ICD-10-CM | POA: Diagnosis not present

## 2019-01-25 DIAGNOSIS — Z299 Encounter for prophylactic measures, unspecified: Secondary | ICD-10-CM | POA: Diagnosis not present

## 2019-01-25 DIAGNOSIS — I10 Essential (primary) hypertension: Secondary | ICD-10-CM | POA: Diagnosis not present

## 2019-01-29 ENCOUNTER — Inpatient Hospital Stay (HOSPITAL_COMMUNITY): Payer: Medicare Other

## 2019-01-29 ENCOUNTER — Inpatient Hospital Stay (HOSPITAL_BASED_OUTPATIENT_CLINIC_OR_DEPARTMENT_OTHER): Payer: Medicare Other | Admitting: Hematology

## 2019-01-29 ENCOUNTER — Encounter (HOSPITAL_COMMUNITY): Payer: Self-pay | Admitting: Hematology

## 2019-01-29 ENCOUNTER — Other Ambulatory Visit: Payer: Self-pay

## 2019-01-29 DIAGNOSIS — D6959 Other secondary thrombocytopenia: Secondary | ICD-10-CM | POA: Diagnosis not present

## 2019-01-29 DIAGNOSIS — Z5111 Encounter for antineoplastic chemotherapy: Secondary | ICD-10-CM | POA: Diagnosis not present

## 2019-01-29 DIAGNOSIS — C184 Malignant neoplasm of transverse colon: Secondary | ICD-10-CM

## 2019-01-29 DIAGNOSIS — Z9221 Personal history of antineoplastic chemotherapy: Secondary | ICD-10-CM | POA: Diagnosis not present

## 2019-01-29 DIAGNOSIS — Z87891 Personal history of nicotine dependence: Secondary | ICD-10-CM | POA: Diagnosis not present

## 2019-01-29 DIAGNOSIS — T451X5A Adverse effect of antineoplastic and immunosuppressive drugs, initial encounter: Secondary | ICD-10-CM | POA: Diagnosis not present

## 2019-01-29 DIAGNOSIS — Z923 Personal history of irradiation: Secondary | ICD-10-CM | POA: Diagnosis not present

## 2019-01-29 DIAGNOSIS — Z932 Ileostomy status: Secondary | ICD-10-CM | POA: Diagnosis not present

## 2019-01-29 DIAGNOSIS — Z9049 Acquired absence of other specified parts of digestive tract: Secondary | ICD-10-CM

## 2019-01-29 DIAGNOSIS — G620 Drug-induced polyneuropathy: Secondary | ICD-10-CM | POA: Diagnosis not present

## 2019-01-29 LAB — CBC WITH DIFFERENTIAL/PLATELET
Abs Immature Granulocytes: 0.02 K/uL (ref 0.00–0.07)
Basophils Absolute: 0.1 K/uL (ref 0.0–0.1)
Basophils Relative: 1 %
Eosinophils Absolute: 0.1 K/uL (ref 0.0–0.5)
Eosinophils Relative: 3 %
HCT: 37.3 % — ABNORMAL LOW (ref 39.0–52.0)
Hemoglobin: 12.4 g/dL — ABNORMAL LOW (ref 13.0–17.0)
Immature Granulocytes: 0 %
Lymphocytes Relative: 25 %
Lymphs Abs: 1.4 K/uL (ref 0.7–4.0)
MCH: 35.4 pg — ABNORMAL HIGH (ref 26.0–34.0)
MCHC: 33.2 g/dL (ref 30.0–36.0)
MCV: 106.6 fL — ABNORMAL HIGH (ref 80.0–100.0)
Monocytes Absolute: 0.8 K/uL (ref 0.1–1.0)
Monocytes Relative: 14 %
Neutro Abs: 3.2 K/uL (ref 1.7–7.7)
Neutrophils Relative %: 57 %
Platelets: 86 K/uL — ABNORMAL LOW (ref 150–400)
RBC: 3.5 MIL/uL — ABNORMAL LOW (ref 4.22–5.81)
RDW: 17.7 % — ABNORMAL HIGH (ref 11.5–15.5)
WBC: 5.6 K/uL (ref 4.0–10.5)
nRBC: 0 % (ref 0.0–0.2)

## 2019-01-29 LAB — COMPREHENSIVE METABOLIC PANEL WITH GFR
ALT: 23 U/L (ref 0–44)
AST: 31 U/L (ref 15–41)
Albumin: 3.7 g/dL (ref 3.5–5.0)
Alkaline Phosphatase: 80 U/L (ref 38–126)
Anion gap: 8 (ref 5–15)
BUN: 11 mg/dL (ref 8–23)
CO2: 23 mmol/L (ref 22–32)
Calcium: 9.3 mg/dL (ref 8.9–10.3)
Chloride: 105 mmol/L (ref 98–111)
Creatinine, Ser: 0.88 mg/dL (ref 0.61–1.24)
GFR calc Af Amer: >60 mL/min
GFR calc non Af Amer: >60 mL/min
Glucose, Bld: 200 mg/dL — ABNORMAL HIGH (ref 70–99)
Potassium: 4.2 mmol/L (ref 3.5–5.1)
Sodium: 136 mmol/L (ref 135–145)
Total Bilirubin: 0.9 mg/dL (ref 0.3–1.2)
Total Protein: 6.8 g/dL (ref 6.5–8.1)

## 2019-01-29 LAB — MAGNESIUM: Magnesium: 2.3 mg/dL (ref 1.7–2.4)

## 2019-01-29 MED ORDER — HEPARIN SOD (PORK) LOCK FLUSH 100 UNIT/ML IV SOLN
500.0000 [IU] | Freq: Once | INTRAVENOUS | Status: AC
  Administered 2019-01-29: 500 [IU] via INTRAVENOUS

## 2019-01-29 MED ORDER — SODIUM CHLORIDE 0.9% FLUSH
10.0000 mL | Freq: Once | INTRAVENOUS | Status: AC
  Administered 2019-01-29: 10 mL via INTRAVENOUS

## 2019-01-29 NOTE — Patient Instructions (Signed)
[facility] Discharge Instructions for Patients Receiving Chemotherapy   Beginning January 23rd 2017 lab work for the Cancer Center will be done in the  Main lab at  on 1st floor. If you have a lab appointment with the Cancer Center please come in thru the  Main Entrance and check in at the main information desk   Today you received the following chemotherapy agents Leucovorin and 5FU  To help prevent nausea and vomiting after your treatment, we encourage you to take your nausea medication   If you develop nausea and vomiting, or diarrhea that is not controlled by your medication, call the clinic.  The clinic phone number is [phone]. Office hours are Monday-Friday 8:30am-5:00pm.  BELOW ARE SYMPTOMS THAT SHOULD BE REPORTED IMMEDIATELY:  *FEVER GREATER THAN 101.0 F  *CHILLS WITH OR WITHOUT FEVER  NAUSEA AND VOMITING THAT IS NOT CONTROLLED WITH YOUR NAUSEA MEDICATION  *UNUSUAL SHORTNESS OF BREATH  *UNUSUAL BRUISING OR BLEEDING  TENDERNESS IN MOUTH AND THROAT WITH OR WITHOUT PRESENCE OF ULCERS  *URINARY PROBLEMS  *BOWEL PROBLEMS  UNUSUAL RASH Items with * indicate a potential emergency and should be followed up as soon as possible. If you have an emergency after office hours please contact your primary care physician or go to the nearest emergency department.  Please call the clinic during office hours if you have any questions or concerns.   You may also contact the Patient Navigator at [phone] should you have any questions or need assistance in obtaining follow up care.      Resources For Cancer Patients and their Caregivers ? American Cancer Society: Can assist with transportation, wigs, general needs, runs Look Good Feel Better.        1-888-227-6333 ? Cancer Care: Provides financial assistance, online support groups, medication/co-pay assistance.  1-800-813-HOPE (4673) ? [facility] Assists [place] cancer patients and their families through emotional , educational and financial support.  336-427-4357 ? [place] DSS Where to apply for food stamps, Medicaid and utility assistance. 336-342-1394 ? [facility]: Transportation to medical appointments. 336-347-2287 ? Social Security Administration: May apply for disability if have a Stage IV cancer. 336-342-7796 1-800-772-1213 ? [place] Aging, Disability and Transit Services: Assists with nutrition, care and transit needs. 336-349-2343          

## 2019-01-29 NOTE — Assessment & Plan Note (Signed)
1.  Stage IIb (T4AN0) transverse colon adenocarcinoma, MSI-stable: - He had a history of colon cancer in June 2008, status post resection and did not require any adjuvant therapy. - He had a recurrence in February 2009, underwent resection with colostomy.  This was followed by 6 months of FOLFOX based chemotherapy sandwiching radiation. - He noticed blood in the colostomy bag.  He underwent colonoscopy on 08/25/2018 which showed malignant partially obstructing tumor in the mid transverse colon with active bleeding.  Other parts of the colon was normal.  Biopsy showed adenocarcinoma.  - Completion colectomy with end ileostomy, lysis of adhesions and bladder repair on 08/29/2018 by Dr. Constance Haw. - Pathology showed pT4a, PN0, 0/16 lymph nodes positive, MMR normal.  Margins were negative. -PET/CT scan on 09/28/2018 shows no findings of active malignancy.  There is some mild activity associated with left upper quadrant ascites and anterior abdominal wall wound sites, likely inflammatory but potentially meriting surveillance.  Liver appears unremarkable.  CEA was 2.0. -8 cycles of adjuvant chemotherapy with infusional 5-FU/leucovorin from 10/09/2018 through 01/15/2019. -He has some baseline diarrhea which is unchanged.  He takes up to 4 Imodium tablets daily. -He felt severely weak over the past 2 weeks.  Usually his weakness improves within a week.  This time it persisted for the whole 2 weeks. - I have reviewed his blood work.  It is adequate to proceed with next cycle.  However because of his severe weakness, we will hold his chemotherapy today.  I will reevaluate him in 1 week.  2.  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 -Numbness in the fingertips is slightly worse than the feet and has been stable.  He was treated with gabapentin in the past.

## 2019-01-29 NOTE — Progress Notes (Signed)
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[place])   09/29/2018 Cancer Staging   Staging form: Colon and Rectum, AJCC 8th Edition - Clinical stage from 09/29/2018: Stage IIB (cT4a, cN0, cM0) - Signed by Derek Jack, MD on 09/29/2018   10/09/2018 -  Chemotherapy   The patient had ondansetron (ZOFRAN) 8 mg in sodium chloride 0.9 % 50 mL IVPB, 8 mg (100 % of original dose 8 mg), Intravenous,  Once, 8 of 12 cycles Dose modification: 8 mg (original dose 8 mg, Cycle 1) Administration: 8 mg (10/09/2018), 8 mg (10/23/2018), 8 mg (11/06/2018), 8 mg (11/20/2018), 8 mg (12/04/2018), 8 mg (12/18/2018), 8 mg (01/02/2019), 8 mg (01/15/2019) leucovorin 800 mg in dextrose 5 % 250 mL infusion, 820 mg, Intravenous,  Once, 8 of 12 cycles Administration: 800 mg (10/09/2018), 820 mg (10/23/2018), 820 mg (11/06/2018), 820 mg (11/20/2018), 820 mg (12/04/2018), 820 mg (12/18/2018), 820 mg (01/02/2019), 820 mg (01/15/2019) fluorouracil (ADRUCIL) chemo injection 800 mg, 400 mg/m2 = 800 mg, Intravenous,  Once, 8 of 12 cycles Administration: 800 mg (10/09/2018), 800 mg (10/23/2018), 800 mg (11/06/2018), 800 mg (11/20/2018), 800 mg (12/04/2018), 800 mg (12/18/2018), 800 mg (01/02/2019), 800 mg (01/15/2019) fluorouracil (ADRUCIL) 4,900 mg in sodium chloride 0.9 % 52 mL chemo infusion, 2,400 mg/m2 = 4,900 mg, Intravenous, 1 Day/Dose, 8 of 12 cycles Administration: 4,900 mg (10/09/2018), 4,900 mg (10/23/2018), 5,000 mg (11/06/2018), 5,000 mg (11/20/2018), 5,000 mg (12/04/2018), 5,000 mg (12/18/2018), 5,000 mg (01/02/2019), 5,000 mg (01/15/2019)  for chemotherapy treatment.       CANCER STAGING: Cancer Staging Cancer of transverse  colon [facility]) Staging form: Colon and Rectum, AJCC 8th Edition - Clinical stage from 09/29/2018: Stage IIB (cT4a, cN0, cM0) - Signed by Derek Jack, MD on 09/29/2018    INTERVAL HISTORY:  Mr. Wild 77 y.o. male returns for routine follow-up for colon cancer. He is feeling very weak and wishes to take a week off from treatment. He started drinking ensure daily to help with his weight. Denies any nausea, vomiting, or diarrhea. Denies any new pains. Had not noticed any recent bleeding such as epistaxis, hematuria or hematochezia. Denies recent chest pain on exertion, shortness of breath on minimal exertion, pre-syncopal episodes, or palpitations. Denies any numbness or tingling in hands or feet. Denies any recent fevers, infections, or recent hospitalizations. Patient reports appetite at 0% and energy level at 0%. He is still maintaining his weight at this time.      REVIEW OF SYSTEMS:  Review of Systems  Constitutional: Positive for fatigue.  Skin: Positive for itching and rash.  Neurological: Positive for extremity weakness and numbness.  All other systems reviewed and are negative.    PAST MEDICAL/SURGICAL HISTORY:  Past Medical History:  Diagnosis Date  . Anemia    Causing interruption in anticoagulation  . Atrial fibrillation and flutter ([place])   . BPH (benign prostatic hypertrophy)   . Colon cancer ([place])    Status post LAR 2008  . Hypothyroidism   . Lacunar infarction ([place]) 11/2012   RT 3rd NERVE PALSY, SB [name]  . PE (pulmonary embolism)    Temporarily on Eliquis 2017  . Skin cancer  35 years ago  . Type 2 diabetes mellitus ([place])    Past Surgical History:  Procedure Laterality Date  . ABDOMINOPERINEAL PROCTOCOLECTOMY  2009   end colostomy   . BIOPSY  08/25/2018   Procedure: BIOPSY;  Surgeon: Rogene Houston, MD;  Location: [facility] ENDO SU[facility];  Service: Endoscopy;;  . BLADDER REPAIR  08/29/2018   Procedure: BLADDER REPAIR;  Surgeon: [name], MD;   Location: [facility] ORS;  Service: General;;  . COLECTOMY  2008  . COLONOSCOPY WITH PROPOFOL N/A 08/25/2018   Procedure: COLONOSCOPY WITH PROPOFOL;  Surgeon: Rogene Houston, MD;  Location: [facility] ENDO SU[facility];  Service: Endoscopy;  Laterality: N/A;  2:20  . COLOSTOMY    . EXPLORATORY LAPAROTOMY  2017   ? pneumoperitoneum of unknown etiology/ negative Ex lap  . ILEOSTOMY  08/29/2018   Procedure: ILEOSTOMY;  Surgeon: [name], MD;  Location: [facility] ORS;  Service: General;;  . LAPAROSCOPIC CHOLECYSTECTOMY  2007  . LYSIS OF ADHESION  08/29/2018   Procedure: LYSIS OF ADHESION;  Surgeon: [name], MD;  Location: [facility] ORS;  Service: General;;  . PARTIAL COLECTOMY N/A 08/29/2018   Procedure: PARTIAL COLECTOMY;  Surgeon: [name], MD;  Location: [facility] ORS;  Service: General;  Laterality: N/A;  . PORTACATH PLACEMENT Left 10/04/2018   Procedure: INSERTION PORT-A-CATH (attached catheter left subclavian);  Surgeon: [name], MD;  Location: [facility] ORS;  Service: General;  Laterality: Left;  . SBO SURGERY  06/2009     SOCIAL HISTORY:  Social History   Socioeconomic History  . Marital status: Married    Spouse name: Not on file  . Number of children: Not on file  . Years of education: Not on file  . Highest education level: Not on file  Occupational History  . Occupation: Architect    Comment: [employer]  Social Needs  . Financial resource strain: Not hard at all  . Food insecurity    Worry: Never true    Inability: Never true  . Transportation needs    Medical: No    Non-medical: No  Tobacco Use  . Smoking status: Former Smoker    Packs/day: 1.00    Years: 27.00    Pack years: 27.00    Types: Cigarettes    Start date: 01/27/1964    Quit date: 01/27/1991    Years since quitting: 28.0  . Smokeless tobacco: Former Systems developer    Types: [place] date: 08/10/1995  Substance and Sexual Activity  . Alcohol use: Never    Alcohol/week: 0.0 standard drinks    Frequency: Never  .  Drug use: Never  . Sexual activity: Not Currently  Lifestyle  . Physical activity    Days per week: 0 days    Minutes per session: 0 min  . Stress: Not at all  Relationships  . Social Herbalist on phone: Twice a week    Gets together: Once a week    Attends religious service: Not on file    Active member of club or organization: Not on file    Attends meetings of clubs or organizations: Not on file    Relationship status: Not on file  . Intimate partner violence    Fear of current or ex partner: No    Emotionally abused: No    Physically abused: No    Forced sexual activity: No  Other Topics Concern  . Not on file  Social History Narrative  .  Not on file    FAMILY HISTORY:  Family History  Problem Relation Age of Onset  . Heart disease Father   . Heart failure Father   . Heart attack Father   . Prostate cancer Paternal Uncle     CURRENT MEDICATIONS:  Outpatient Encounter Medications as of 01/29/2019  Medication Sig  . aspirin 81 MG tablet Take 81 mg by mouth daily.  . ferrous sulfate (FERROUSUL) 325 (65 FE) MG tablet Take 1 tablet (325 mg total) by mouth 2 (two) times daily with a meal.  . glipiZIDE (GLUCOTROL) 10 MG tablet Take 10 mg by mouth 2 (two) times daily.  Marland Kitchen levothyroxine (SYNTHROID, LEVOTHROID) 50 MCG tablet Take 50 mcg by mouth daily before breakfast.  . lidocaine-prilocaine (EMLA) cream Apply to affected area once  . loperamide (IMODIUM) 2 MG capsule Take 2 capsules (4 mg total) by mouth 4 (four) times daily -  before meals and at bedtime.  . metoprolol tartrate (LOPRESSOR) 25 MG tablet Take 1 tablet (25 mg total) by mouth 2 (two) times daily.  . prochlorperazine (COMPAZINE) 10 MG tablet Take 1 tablet (10 mg total) by mouth every 6 (six) hours as needed (Nausea or vomiting).  . tamsulosin (FLOMAX) 0.4 MG CAPS capsule Take 0.4 mg by mouth at bedtime.   . triamcinolone cream (KENALOG) 0.1 % APPLY CREAM EXTERNALLY TWICE DAILY  . cetirizine (ZYRTEC)  10 MG tablet Take 10 mg by mouth daily.  . predniSONE (STERAPRED UNI-PAK 21 TAB) 5 MG (21) TBPK tablet See admin instructions.  . psyllium (HYDROCIL/METAMUCIL) 95 % PACK Take 1 packet by mouth daily. (Patient not taking: Reported on 01/29/2019)  . vitamin C (ASCORBIC ACID) 500 MG tablet Take 1 tablet (500 mg total) by mouth daily. (Patient not taking: Reported on 01/29/2019)   Facility-Administered Encounter Medications as of 01/29/2019  Medication  . iothalamate meglumine (CYSTO-CONRAY II) 17.2 % solution 250 mL    ALLERGIES:  No Known Allergies   PHYSICAL EXAM:  ECOG Performance status: 2  Vitals:   01/29/19 0905  BP: 121/70  Pulse: 73  Resp: 16  Temp: 98.6 F (37 C)  SpO2: 100%   Filed Weights   01/29/19 0905  Weight: 188 lb 6.4 oz (85.5 kg)    Physical Exam Constitutional:      Appearance: Normal appearance. He is normal weight.  Cardiovascular:     Rate and Rhythm: Normal rate and regular rhythm.     Heart sounds: Normal heart sounds.  Pulmonary:     Effort: Pulmonary effort is normal.     Breath sounds: Normal breath sounds.  Abdominal:     General: Bowel sounds are normal.     Palpations: Abdomen is soft.  Musculoskeletal: Normal range of motion.  Skin:    General: Skin is warm and dry.  Neurological:     Mental Status: He is alert and oriented to person, place, and time. Mental status is at baseline.  Psychiatric:        Mood and Affect: Mood normal.        Behavior: Behavior normal.        Thought Content: Thought content normal.        Judgment: Judgment normal.      LABORATORY DATA:  I have reviewed the labs as listed.  CBC    Component Value Date/Time   WBC 5.6 01/29/2019 0901   RBC 3.50 (L) 01/29/2019 0901   HGB 12.4 (L) 01/29/2019 0901   HGB 11.8 (L) 09/14/2018 1514  HCT 37.3 (L) 01/29/2019 0901   HCT 38.4 09/14/2018 1514   PLT 86 (L) 01/29/2019 0901   PLT 468 (H) 09/14/2018 1514   MCV 106.6 (H) 01/29/2019 0901   MCV 87 09/14/2018  1514   MCH 35.4 (H) 01/29/2019 0901   MCHC 33.2 01/29/2019 0901   RDW 17.7 (H) 01/29/2019 0901   RDW 14.3 09/14/2018 1514   LYMPHSABS 1.4 01/29/2019 0901   LYMPHSABS 1.8 09/14/2018 1514   MONOABS 0.8 01/29/2019 0901   EOSABS 0.1 01/29/2019 0901   EOSABS 0.6 (H) 09/14/2018 1514   BASOSABS 0.1 01/29/2019 0901   BASOSABS 0.1 09/14/2018 1514   CMP Latest Ref Rng & Units 01/29/2019 01/15/2019 01/02/2019  Glucose 70 - 99 mg/dL 200(H) 182(H) 220(H)  BUN 8 - 23 mg/dL '11 10 12  '$ Creatinine 0.61 - 1.24 mg/dL 0.88 0.99 0.96  Sodium 135 - 145 mmol/L 136 136 139  Potassium 3.5 - 5.1 mmol/L 4.2 4.1 4.0  Chloride 98 - 111 mmol/L 105 104 106  CO2 22 - 32 mmol/L '23 23 23  '$ Calcium 8.9 - 10.3 mg/dL 9.3 9.2 9.2  Total Protein 6.5 - 8.1 g/dL 6.8 6.5 6.6  Total Bilirubin 0.3 - 1.2 mg/dL 0.9 0.9 0.5  Alkaline Phos 38 - 126 U/L 80 73 101  AST 15 - 41 U/L 31 29 35  ALT 0 - 44 U/L '23 23 31       '$ DIAGNOSTIC IMAGING:  I have independently reviewed the scans and discussed with the patient.   I have reviewed Francene Finders, NP's note and agree with the documentation.  I personally performed a face-to-face visit, made revisions and my assessment and plan is as follows.    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  Stage IIb (T4AN0) transverse colon adenocarcinoma, MSI-stable: - He had a history of colon cancer in June 2008, status post resection and did not require any adjuvant therapy. - He had a recurrence in February 2009, underwent resection with colostomy.  This was followed by 6 months of FOLFOX based chemotherapy sandwiching radiation. - He noticed blood in the colostomy bag.  He underwent colonoscopy on 08/25/2018 which showed malignant partially obstructing tumor in the mid transverse colon with active bleeding.  Other parts of the colon was normal.  Biopsy showed adenocarcinoma.  - Completion colectomy with end ileostomy, lysis of adhesions and bladder repair on 08/29/2018 by Dr.  Constance Haw. - Pathology showed pT4a, PN0, 0/16 lymph nodes positive, MMR normal.  Margins were negative. -PET/CT scan on 09/28/2018 shows no findings of active malignancy.  There is some mild activity associated with left upper quadrant ascites and anterior abdominal wall wound sites, likely inflammatory but potentially meriting surveillance.  Liver appears unremarkable.  CEA was 2.0. -8 cycles of adjuvant chemotherapy with infusional 5-FU/leucovorin from 10/09/2018 through 01/15/2019. -He has some baseline diarrhea which is unchanged.  He takes up to 4 Imodium tablets daily. -He felt severely weak over the past 2 weeks.  Usually his weakness improves within a week.  This time it persisted for the whole 2 weeks. - I have reviewed his blood work.  It is adequate to proceed with next cycle.  However because of his severe weakness, we will hold his chemotherapy today.  I will reevaluate him in 1 week.  2.  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 -Numbness in the fingertips is slightly worse than the feet and has been stable.  He was treated with  gabapentin in the past.             Total time spent is 25 minutes with more than 50% of the time spent face-to-face discussing treatment plan and counseling and coordination of care.    Orders placed this encounter:  No orders of the defined types were placed in this encounter.     Derek Jack, MD [place] [phone]

## 2019-01-29 NOTE — Progress Notes (Signed)
[place] seen by Dr. Delton Coombes today and lab work reviewed. Treatment is being held today d/t severe weakness and fatigue reported by patient. Port flushed and deaccessed and patient discharged in satisfactory condition.

## 2019-01-29 NOTE — Patient Instructions (Signed)
[facility] at [facility] Discharge Instructions   Follow up in 1 weeks with labs and treatment.   Thank you for choosing [facility] at [facility] to provide your oncology and hematology care.  To afford each patient quality time with our provider, please arrive at least 15 minutes before your scheduled appointment time.   If you have a lab appointment with the Cancer Center please come in thru the  Main Entrance and check in at the main information desk  You need to re-schedule your appointment should you arrive 10 or more minutes late.  We strive to give you quality time with our providers, and arriving late affects you and other patients whose appointments are after yours.  Also, if you no show three or more times for appointments you may be dismissed from the clinic at the providers discretion.     Again, thank you for choosing [facility].  Our hope is that these requests will decrease the amount of time that you wait before being seen by our physicians.       _____________________________________________________________  Should you have questions after your visit to [facility], please contact our office at [phone] between the hours of 8:00 a.m. and 4:30 p.m.  Voicemails left after 4:00 p.m. will not be returned until the following business day.  For prescription refill requests, have your pharmacy contact our office and allow 72 hours.    Cancer Center Support Programs:   > Cancer Support Group  2nd Tuesday of the month 1pm-2pm, Journey Room    

## 2019-01-30 ENCOUNTER — Other Ambulatory Visit: Payer: Self-pay | Admitting: General Surgery

## 2019-01-30 DIAGNOSIS — D649 Anemia, unspecified: Secondary | ICD-10-CM

## 2019-01-31 ENCOUNTER — Encounter (HOSPITAL_COMMUNITY): Payer: Medicare Other

## 2019-02-06 ENCOUNTER — Encounter (HOSPITAL_COMMUNITY): Payer: Self-pay | Admitting: Hematology

## 2019-02-06 ENCOUNTER — Inpatient Hospital Stay (HOSPITAL_COMMUNITY): Payer: Medicare Other

## 2019-02-06 ENCOUNTER — Encounter (HOSPITAL_COMMUNITY): Payer: Self-pay

## 2019-02-06 ENCOUNTER — Inpatient Hospital Stay (HOSPITAL_BASED_OUTPATIENT_CLINIC_OR_DEPARTMENT_OTHER): Payer: Medicare Other | Admitting: Hematology

## 2019-02-06 ENCOUNTER — Other Ambulatory Visit: Payer: Self-pay

## 2019-02-06 DIAGNOSIS — Z9049 Acquired absence of other specified parts of digestive tract: Secondary | ICD-10-CM | POA: Diagnosis not present

## 2019-02-06 DIAGNOSIS — C184 Malignant neoplasm of transverse colon: Secondary | ICD-10-CM | POA: Diagnosis not present

## 2019-02-06 DIAGNOSIS — Z87891 Personal history of nicotine dependence: Secondary | ICD-10-CM

## 2019-02-06 DIAGNOSIS — Z5111 Encounter for antineoplastic chemotherapy: Secondary | ICD-10-CM | POA: Diagnosis not present

## 2019-02-06 DIAGNOSIS — Z923 Personal history of irradiation: Secondary | ICD-10-CM | POA: Diagnosis not present

## 2019-02-06 DIAGNOSIS — T451X5A Adverse effect of antineoplastic and immunosuppressive drugs, initial encounter: Secondary | ICD-10-CM | POA: Diagnosis not present

## 2019-02-06 DIAGNOSIS — Z932 Ileostomy status: Secondary | ICD-10-CM | POA: Diagnosis not present

## 2019-02-06 DIAGNOSIS — Z9221 Personal history of antineoplastic chemotherapy: Secondary | ICD-10-CM

## 2019-02-06 DIAGNOSIS — R531 Weakness: Secondary | ICD-10-CM

## 2019-02-06 DIAGNOSIS — G620 Drug-induced polyneuropathy: Secondary | ICD-10-CM | POA: Diagnosis not present

## 2019-02-06 DIAGNOSIS — D6959 Other secondary thrombocytopenia: Secondary | ICD-10-CM | POA: Diagnosis not present

## 2019-02-06 LAB — COMPREHENSIVE METABOLIC PANEL
ALT: 21 U/L (ref 0–44)
AST: 31 U/L (ref 15–41)
Albumin: 3.5 g/dL (ref 3.5–5.0)
Alkaline Phosphatase: 83 U/L (ref 38–126)
Anion gap: 9 (ref 5–15)
BUN: 13 mg/dL (ref 8–23)
CO2: 23 mmol/L (ref 22–32)
Calcium: 8.7 mg/dL — ABNORMAL LOW (ref 8.9–10.3)
Chloride: 107 mmol/L (ref 98–111)
Creatinine, Ser: 0.95 mg/dL (ref 0.61–1.24)
GFR calc Af Amer: >60 mL/min (ref >60)
GFR calc non Af Amer: >60 mL/min (ref >60)
Glucose, Bld: 184 mg/dL — ABNORMAL HIGH (ref 70–99)
Potassium: 4.2 mmol/L (ref 3.5–5.1)
Sodium: 139 mmol/L (ref 135–145)
Total Bilirubin: 0.7 mg/dL (ref 0.3–1.2)
Total Protein: 6.5 g/dL (ref 6.5–8.1)

## 2019-02-06 LAB — CBC WITH DIFFERENTIAL/PLATELET
Abs Immature Granulocytes: 0.02 10*3/uL (ref 0.00–0.07)
Basophils Absolute: 0.1 10*3/uL (ref 0.0–0.1)
Basophils Relative: 2 %
Eosinophils Absolute: 0.1 10*3/uL (ref 0.0–0.5)
Eosinophils Relative: 4 %
HCT: 35.8 % — ABNORMAL LOW (ref 39.0–52.0)
Hemoglobin: 12 g/dL — ABNORMAL LOW (ref 13.0–17.0)
Immature Granulocytes: 1 %
Lymphocytes Relative: 33 %
Lymphs Abs: 1 10*3/uL (ref 0.7–4.0)
MCH: 36.8 pg — ABNORMAL HIGH (ref 26.0–34.0)
MCHC: 33.5 g/dL (ref 30.0–36.0)
MCV: 109.8 fL — ABNORMAL HIGH (ref 80.0–100.0)
Monocytes Absolute: 0.5 10*3/uL (ref 0.1–1.0)
Monocytes Relative: 17 %
Neutro Abs: 1.4 10*3/uL — ABNORMAL LOW (ref 1.7–7.7)
Neutrophils Relative %: 43 %
Platelets: 92 10*3/uL — ABNORMAL LOW (ref 150–400)
RBC: 3.26 MIL/uL — ABNORMAL LOW (ref 4.22–5.81)
RDW: 16.7 % — ABNORMAL HIGH (ref 11.5–15.5)
WBC: 3.1 10*3/uL — ABNORMAL LOW (ref 4.0–10.5)
nRBC: 0 % (ref 0.0–0.2)

## 2019-02-06 LAB — MAGNESIUM: Magnesium: 2.1 mg/dL (ref 1.7–2.4)

## 2019-02-06 MED ORDER — SODIUM CHLORIDE 0.9 % IV SOLN
8.0000 mg | Freq: Once | INTRAVENOUS | Status: AC
  Administered 2019-02-06: 8 mg via INTRAVENOUS
  Filled 2019-02-06: qty 4

## 2019-02-06 MED ORDER — SODIUM CHLORIDE 0.9 % IV SOLN
2430.0000 mg/m2 | INTRAVENOUS | Status: DC
  Administered 2019-02-06: 5000 mg via INTRAVENOUS
  Filled 2019-02-06: qty 100

## 2019-02-06 MED ORDER — FLUOROURACIL CHEMO INJECTION 2.5 GM/50ML
400.0000 mg/m2 | Freq: Once | INTRAVENOUS | Status: AC
  Administered 2019-02-06: 800 mg via INTRAVENOUS
  Filled 2019-02-06: qty 16

## 2019-02-06 MED ORDER — SODIUM CHLORIDE 0.9 % IV SOLN
400.0000 mg/m2 | Freq: Once | INTRAVENOUS | Status: AC
  Administered 2019-02-06: 820 mg via INTRAVENOUS
  Filled 2019-02-06: qty 41

## 2019-02-06 MED ORDER — SODIUM CHLORIDE 0.9% FLUSH
10.0000 mL | INTRAVENOUS | Status: DC | PRN
  Administered 2019-02-06: 10 mL
  Filled 2019-02-06: qty 10

## 2019-02-06 MED ORDER — SODIUM CHLORIDE 0.9 % IV SOLN
Freq: Once | INTRAVENOUS | Status: AC
  Administered 2019-02-06: 09:00:00 via INTRAVENOUS

## 2019-02-06 NOTE — Progress Notes (Signed)
Pt presents today for treatment and appointment with Dr. Delton Coombes. VS within parameters for tx. Pt has no complaints of pain or any changes since the last visit. Pt states, " I feel so good this time around, I asked could I have more time in between treatments." Labs drawn and pending.   Pt seen by Dr. Delton Coombes today. Message received to proceed with tx. Labs reviewed by MD. Platelets 92 today. ANC 1.4.  MD aware. All other labs within parameters for tx.   Treatment given today per MD orders. Tolerated infusion without adverse affects. Vital signs stable. No complaints at this time. 60F/U pump running per protocol. RUN noted on the screen. Discharged from clinic ambulatory. F/U with [facility] as scheduled.

## 2019-02-06 NOTE — Progress Notes (Signed)
[place], [name]   CLINIC:  Medical Oncology/Hematology  PCP:  Jose Chroman, MD Jose [place] [id number] [phone]   REASON FOR VISIT: Follow-up for colon cancer   BRIEF ONCOLOGIC HISTORY:  Oncology History  Cancer of transverse colon (Jose [place])  09/23/2018 Initial Diagnosis   Cancer of transverse colon (Jose [place])   09/29/2018 Cancer Staging   Staging form: Colon and Rectum, AJCC 8th Edition - Clinical stage from 09/29/2018: Stage IIB (cT4a, cN0, cM0) - Signed by Jose Jack, MD on 09/29/2018   10/09/2018 -  Chemotherapy   Lawrence patient had ondansetron (ZOFRAN) 8 mg in sodium chloride 0.9 % 50 mL IVPB, 8 mg (100 % of original dose 8 mg), Intravenous,  Once, 9 of 12 cycles Dose modification: 8 mg (original dose 8 mg, Cycle 1) Administration: 8 mg (10/09/2018), 8 mg (10/23/2018), 8 mg (11/06/2018), 8 mg (11/20/2018), 8 mg (12/04/2018), 8 mg (12/18/2018), 8 mg (01/02/2019), 8 mg (01/15/2019), 8 mg (02/06/2019) leucovorin 800 mg in dextrose 5 % 250 mL infusion, 820 mg, Intravenous,  Once, 9 of 12 cycles Administration: 800 mg (10/09/2018), 820 mg (10/23/2018), 820 mg (11/06/2018), 820 mg (11/20/2018), 820 mg (12/04/2018), 820 mg (12/18/2018), 820 mg (01/02/2019), 820 mg (01/15/2019), 820 mg (02/06/2019) fluorouracil (ADRUCIL) chemo injection 800 mg, 400 mg/m2 = 800 mg, Intravenous,  Once, 9 of 12 cycles Administration: 800 mg (10/09/2018), 800 mg (10/23/2018), 800 mg (11/06/2018), 800 mg (11/20/2018), 800 mg (12/04/2018), 800 mg (12/18/2018), 800 mg (01/02/2019), 800 mg (01/15/2019), 800 mg (02/06/2019) fluorouracil (ADRUCIL) 4,900 mg in sodium chloride 0.9 % 52 mL chemo infusion, 2,400 mg/m2 = 4,900 mg, Intravenous, 1 Day/Dose, 9 of 12 cycles Administration: 4,900 mg (10/09/2018), 4,900 mg (10/23/2018), 5,000 mg (11/06/2018), 5,000 mg (11/20/2018), 5,000 mg (12/04/2018), 5,000 mg (12/18/2018), 5,000 mg (01/02/2019), 5,000 mg (01/15/2019), 5,000 mg (02/06/2019)  for  chemotherapy treatment.       CANCER STAGING: Cancer Staging Cancer of transverse colon [place]) Staging form: Colon and Rectum, AJCC 8th Edition - Clinical stage from 09/29/2018: Stage IIB (cT4a, cN0, cM0) - Signed by Jose Jack, MD on 09/29/2018    INTERVAL HISTORY:  Jose [place] 78 y.o. male returns for follow-up and next cycle of chemotherapy.  We held his treatment last week secondary to severe tiredness.  His energy levels have improved quite well in Lawrence last 1 week.  Appetite and energy levels are 100%.  Denies any nausea vomiting or diarrhea.  Numbness in Lawrence feet has been stable.  Denies any fevers or infections.  No bleeding was reported.     REVIEW OF SYSTEMS:  Review of Systems  Constitutional: Negative for fatigue.  Neurological: Positive for numbness.  All other systems reviewed and are negative.    PAST MEDICAL/SURGICAL HISTORY:  Past Medical History:  Diagnosis Date  . Anemia    Causing interruption in anticoagulation  . Atrial fibrillation and flutter (Jose [place])   . BPH (benign prostatic hypertrophy)   . Colon cancer ([place])    Status post LAR 2008  . Hypothyroidism   . Lacunar infarction (Jose [place]) 11/2012   RT 3rd NERVE PALSY, SB Dr. Iona Lawrence  . PE (pulmonary embolism)    Temporarily on Eliquis 2017  . Skin cancer    35 years ago  . Type 2 diabetes mellitus ([place])    Past Surgical History:  Procedure Laterality Date  . ABDOMINOPERINEAL PROCTOCOLECTOMY  2009   end colostomy   . BIOPSY  08/25/2018  Procedure: BIOPSY;  Surgeon: Jose Houston, MD;  Location: [facility];  Service: Endoscopy;;  . BLADDER REPAIR  08/29/2018   Procedure: BLADDER REPAIR;  Surgeon: Jose Cagey, MD;  Location: [facility] ORS;  Service: General;;  . COLECTOMY  2008  . COLONOSCOPY WITH PROPOFOL N/A 08/25/2018   Procedure: COLONOSCOPY WITH PROPOFOL;  Surgeon: Jose Houston, MD;  Location: [facility];  Service: Endoscopy;  Laterality: N/A;  2:20  . COLOSTOMY    .  EXPLORATORY LAPAROTOMY  2017   ? pneumoperitoneum of unknown etiology/ negative Ex lap  . ILEOSTOMY  08/29/2018   Procedure: ILEOSTOMY;  Surgeon: Jose Cagey, MD;  Location: [facility];  Service: General;;  . LAPAROSCOPIC CHOLECYSTECTOMY  2007  . LYSIS OF ADHESION  08/29/2018   Procedure: LYSIS OF ADHESION;  Surgeon: Jose Cagey, MD;  Location: [facility];  Service: General;;  . PARTIAL COLECTOMY N/A 08/29/2018   Procedure: PARTIAL COLECTOMY;  Surgeon: Jose Cagey, MD;  Location: [facility];  Service: General;  Laterality: N/A;  . PORTACATH PLACEMENT Left 10/04/2018   Procedure: INSERTION PORT-A-CATH (attached catheter left subclavian);  Surgeon: Jose Cagey, MD;  Location: [facility];  Service: General;  Laterality: Left;  . SBO SURGERY  06/2009     SOCIAL HISTORY:  Social History   Socioeconomic History  . Marital status: Married    Spouse name: Not on file  . Number of children: Not on file  . Years of education: Not on file  . Highest education level: Not on file  Occupational History  . Occupation: Architect    Comment: [employer]  Social Needs  . Financial resource strain: Not hard at all  . Food insecurity    Worry: Never true    Inability: Never true  . Transportation needs    Medical: No    Non-medical: No  Tobacco Use  . Smoking status: Former Smoker    Packs/day: 1.00    Years: 27.00    Pack years: 27.00    Types: Cigarettes    Start date: 01/27/1964    Quit date: 01/27/1991    Years since quitting: 28.0  . Smokeless tobacco: Former Systems developer    Types: [place] date: 08/10/1995  Substance and Sexual Activity  . Alcohol use: Never    Alcohol/week: 0.0 standard drinks    Frequency: Never  . Drug use: Never  . Sexual activity: Not Currently  Lifestyle  . Physical activity    Days per week: 0 days    Minutes per session: 0 min  . Stress: Not at all  Relationships  . Social Herbalist on phone: Twice a week    Gets together: Once a week     Attends religious service: Not on file    Active member of club or organization: Not on file    Attends meetings of clubs or organizations: Not on file    Relationship status: Not on file  . Intimate partner violence    Fear of current or ex partner: No    Emotionally abused: No    Physically abused: No    Forced sexual activity: No  Other Topics Concern  . Not on file  Social History Narrative  . Not on file    FAMILY HISTORY:  Family History  Problem Relation Age of Onset  . Heart disease Father   . Heart failure Father   . Heart attack Father   . Prostate  cancer Paternal Uncle     CURRENT MEDICATIONS:  Outpatient Encounter Medications as of 02/06/2019  Medication Sig  . aspirin 81 MG tablet Take 81 mg by mouth daily.  . cetirizine (ZYRTEC) 10 MG tablet Take 10 mg by mouth daily.  . ferrous sulfate (FERROUSUL) 325 (65 FE) MG tablet Take 1 tablet (325 mg total) by mouth 2 (two) times daily with a meal.  . glipiZIDE (GLUCOTROL) 10 MG tablet Take 10 mg by mouth 2 (two) times daily.  [place] levothyroxine (SYNTHROID, LEVOTHROID) 50 MCG tablet Take 50 mcg by mouth daily before breakfast.  . lidocaine-prilocaine (EMLA) cream Apply to affected area once  . loperamide (IMODIUM) 2 MG capsule Take 2 capsules (4 mg total) by mouth 4 (four) times daily -  before meals and at bedtime.  . metoprolol tartrate (LOPRESSOR) 25 MG tablet Take 1 tablet (25 mg total) by mouth 2 (two) times daily.  . predniSONE (STERAPRED UNI-PAK 21 TAB) 5 MG (21) TBPK tablet See admin instructions.  . prochlorperazine (COMPAZINE) 10 MG tablet Take 1 tablet (10 mg total) by mouth every 6 (six) hours as needed (Nausea or vomiting).  . psyllium (HYDROCIL/METAMUCIL) 95 % PACK Take 1 packet by mouth daily.  . tamsulosin (FLOMAX) 0.4 MG CAPS capsule Take 0.4 mg by mouth at bedtime.   . triamcinolone cream (KENALOG) 0.1 % APPLY CREAM EXTERNALLY TWICE DAILY  . vitamin C (ASCORBIC ACID) 500 MG tablet Take 1 tablet (500 mg  total) by mouth daily.   Facility-Administered Encounter Medications as of 02/06/2019  Medication  . iothalamate meglumine (CYSTO-CONRAY II) 17.2 % solution 250 mL    ALLERGIES:  No Known Allergies   PHYSICAL EXAM:  ECOG Performance status: 2  Vitals:   02/06/19 0758  BP: (!) 119/55  Pulse: 67  Resp: 18  Temp: 97.8 F (36.6 C)  SpO2: 100%   Filed Weights   02/06/19 0758  Weight: 192 lb 12.8 oz (87.5 kg)    Physical Exam Constitutional:      Appearance: Normal appearance. He is normal weight.  Cardiovascular:     Rate and Rhythm: Normal rate and regular rhythm.     Heart sounds: Normal heart sounds.  Pulmonary:     Effort: Pulmonary effort is normal.     Breath sounds: Normal breath sounds.  Abdominal:     General: Bowel sounds are normal.     Palpations: Abdomen is soft.  Musculoskeletal: Normal range of motion.  Skin:    General: Skin is warm and dry.  Neurological:     Mental Status: He is alert and oriented to person, place, and time. Mental status is at baseline.  Psychiatric:        Mood and Affect: Mood normal.        Behavior: Behavior normal.        Thought Content: Thought content normal.        Judgment: Judgment normal.      LABORATORY DATA:  I have reviewed Lawrence labs as listed.  CBC    Component Value Date/Time   WBC 3.1 (L) 02/06/2019 0757   RBC 3.26 (L) 02/06/2019 0757   HGB 12.0 (L) 02/06/2019 0757   HGB 11.8 (L) 09/14/2018 1514   HCT 35.8 (L) 02/06/2019 0757   HCT 38.4 09/14/2018 1514   PLT 92 (L) 02/06/2019 0757   PLT 468 (H) 09/14/2018 1514   MCV 109.8 (H) 02/06/2019 0757   MCV 87 09/14/2018 1514   MCH 36.8 (H) 02/06/2019 0757  MCHC 33.5 02/06/2019 0757   RDW 16.7 (H) 02/06/2019 0757   RDW 14.3 09/14/2018 1514   LYMPHSABS 1.0 02/06/2019 0757   LYMPHSABS 1.8 09/14/2018 1514   MONOABS 0.5 02/06/2019 0757   EOSABS 0.1 02/06/2019 0757   EOSABS 0.6 (H) 09/14/2018 1514   BASOSABS 0.1 02/06/2019 0757   BASOSABS 0.1 09/14/2018  1514   CMP Latest Ref Rng & Units 02/06/2019 01/29/2019 01/15/2019  Glucose 70 - 99 mg/dL 184(H) 200(H) 182(H)  BUN 8 - 23 mg/dL '13 11 10  '$ Creatinine 0.61 - 1.24 mg/dL 0.95 0.88 0.99  Sodium 135 - 145 mmol/L 139 136 136  Potassium 3.5 - 5.1 mmol/L 4.2 4.2 4.1  Chloride 98 - 111 mmol/L 107 105 104  CO2 22 - 32 mmol/L '23 23 23  '$ Calcium 8.9 - 10.3 mg/dL 8.7(L) 9.3 9.2  Total Protein 6.5 - 8.1 g/dL 6.5 6.8 6.5  Total Bilirubin 0.3 - 1.2 mg/dL 0.7 0.9 0.9  Alkaline Phos 38 - 126 U/L 83 80 73  AST 15 - 41 U/L '31 31 29  '$ ALT 0 - 44 U/L '21 23 23       '$ DIAGNOSTIC IMAGING:  I have independently reviewed Lawrence scans and discussed with Lawrence patient.     ASSESSMENT & PLAN:   Cancer of transverse colon ([place]) 1.  Stage IIb (T4AN0) transverse colon adenocarcinoma, MSI-stable: - He had a history of colon cancer in June 2008, status post resection and did not require any adjuvant chemotherapy. -He had a recurrence in February 2009, underwent resection with colostomy, followed by 6 months of FOLFOX based chemotherapy sandwiching radiation. - Presentation with blood in Lawrence colostomy bag, colonoscopy on 08/25/2018 showed malignant partially obstructing tumor in Lawrence mid transverse colon with active bleeding.  Other parts of Lawrence colon was normal.  Biopsy consistent with adenocarcinoma. - Completion colectomy with end ileostomy, lysis of adhesions and bladder repair on 08/29/2018 by Dr. Constance Haw. -Pathology showed pT4a, PN 0, 0/16 lymph nodes positive, MMR normal.  Margins negative. -PET scan on 09/28/2018 shows no findings of active malignancy.  There is some mild activity associated with Lawrence left upper quadrant ascites and anterior abdominal wound sites.  Likely inflammatory but potentially meriting surveillance.  Liver appears unremarkable.  CEA was 2.0. -8 cycles of adjuvant chemotherapy with infusional 5-FU/leucovorin from 10/09/2018 through 01/15/2019. - We held his chemotherapy secondary to severe weakness last  week. -His energy levels have improved to normalcy.  We went over Lawrence blood work.  He may proceed with his treatment today.  We will see him back in 2 weeks.  2.  Diarrhea: -He has some baseline diarrhea which is unchanged.  He takes up to 4 Imodium tablets daily.  3.  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 -Numbness in Lawrence fingertips is slightly worse than Lawrence feet and has been stable.  He was treated with gabapentin in Lawrence past.  Total time spent is 25 minutes with more than 50% of Lawrence time spent face-to-face discussing treatment plan and counseling and coordination of care.    Orders placed this encounter:  No orders of Lawrence defined types were placed in this encounter.     Jose Jack, MD [place] [phone]

## 2019-02-06 NOTE — Assessment & Plan Note (Addendum)
1.  Stage IIb (T4AN0) transverse colon adenocarcinoma, MSI-stable: - He had a history of colon cancer in June 2008, status post resection and did not require any adjuvant chemotherapy. -He had a recurrence in February 2009, underwent resection with colostomy, followed by 6 months of FOLFOX based chemotherapy sandwiching radiation. - Presentation with blood in the colostomy bag, colonoscopy on 08/25/2018 showed malignant partially obstructing tumor in the mid transverse colon with active bleeding.  Other parts of the colon was normal.  Biopsy consistent with adenocarcinoma. - Completion colectomy with end ileostomy, lysis of adhesions and bladder repair on 08/29/2018 by Dr. Constance Haw. -Pathology showed pT4a, PN 0, 0/16 lymph nodes positive, MMR normal.  Margins negative. -PET scan on 09/28/2018 shows no findings of active malignancy.  There is some mild activity associated with the left upper quadrant ascites and anterior abdominal wound sites.  Likely inflammatory but potentially meriting surveillance.  Liver appears unremarkable.  CEA was 2.0. -8 cycles of adjuvant chemotherapy with infusional 5-FU/leucovorin from 10/09/2018 through 01/15/2019. - We held his chemotherapy secondary to severe weakness last week. -His energy levels have improved to normalcy.  We went over the blood work.  He may proceed with his treatment today.  We will see him back in 2 weeks.  2.  Diarrhea: -He has some baseline diarrhea which is unchanged.  He takes up to 4 Imodium tablets daily.  3.  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 -Numbness in the fingertips is slightly worse than the feet and has been stable.  He was treated with gabapentin in the past.

## 2019-02-06 NOTE — Patient Instructions (Signed)
[facility] Discharge Instructions for Patients Receiving Chemotherapy  Today you received the following chemotherapy agents   To help prevent nausea and vomiting after your treatment, we encourage you to take your nausea medication   If you develop nausea and vomiting that is not controlled by your nausea medication, call the clinic.   BELOW ARE SYMPTOMS THAT SHOULD BE REPORTED IMMEDIATELY:  *FEVER GREATER THAN 100.5 F  *CHILLS WITH OR WITHOUT FEVER  NAUSEA AND VOMITING THAT IS NOT CONTROLLED WITH YOUR NAUSEA MEDICATION  *UNUSUAL SHORTNESS OF BREATH  *UNUSUAL BRUISING OR BLEEDING  TENDERNESS IN MOUTH AND THROAT WITH OR WITHOUT PRESENCE OF ULCERS  *URINARY PROBLEMS  *BOWEL PROBLEMS  UNUSUAL RASH Items with * indicate a potential emergency and should be followed up as soon as possible.  Feel free to call the clinic should you have any questions or concerns. The clinic phone number is [phone].  Please show the CHEMO ALERT CARD at check-in to the Emergency Department and triage nurse.   

## 2019-02-06 NOTE — Patient Instructions (Signed)
[facility] Discharge Instructions  You were seen today by Dr. Katragadda. He went over your recent lab results. He will see you back in 2 weeks for labs and follow up.   Thank you for choosing [facility] at [facility] to provide your oncology and hematology care.  To afford each patient quality time with our provider, please arrive at least 15 minutes before your scheduled appointment time.   If you have a lab appointment with the Cancer Center please come in thru the  Main Entrance and check in at the main information desk  You need to re-schedule your appointment should you arrive 10 or more minutes late.  We strive to give you quality time with our providers, and arriving late affects you and other patients whose appointments are after yours.  Also, if you no show three or more times for appointments you may be dismissed from the clinic at the providers discretion.     Again, thank you for choosing [facility].  Our hope is that these requests will decrease the amount of time that you wait before being seen by our physicians.       _____________________________________________________________  Should you have questions after your visit to  Cancer Center, please contact our office at [phone] between the hours of 8:00 a.m. and 4:30 p.m.  Voicemails left after 4:00 p.m. will not be returned until the following business day.  For prescription refill requests, have your pharmacy contact our office and allow 72 hours.    Cancer Center Support Programs:   > Cancer Support Group  2nd Tuesday of the month 1pm-2pm, Journey Room    

## 2019-02-08 ENCOUNTER — Encounter (HOSPITAL_COMMUNITY): Payer: Self-pay

## 2019-02-08 ENCOUNTER — Other Ambulatory Visit: Payer: Self-pay

## 2019-02-08 ENCOUNTER — Inpatient Hospital Stay (HOSPITAL_COMMUNITY): Payer: Medicare Other | Attending: Hematology

## 2019-02-08 VITALS — BP 112/63 | HR 61 | Temp 98.9°F | Resp 18

## 2019-02-08 DIAGNOSIS — I4891 Unspecified atrial fibrillation: Secondary | ICD-10-CM | POA: Insufficient documentation

## 2019-02-08 DIAGNOSIS — Z87891 Personal history of nicotine dependence: Secondary | ICD-10-CM | POA: Diagnosis not present

## 2019-02-08 DIAGNOSIS — G620 Drug-induced polyneuropathy: Secondary | ICD-10-CM | POA: Insufficient documentation

## 2019-02-08 DIAGNOSIS — N400 Enlarged prostate without lower urinary tract symptoms: Secondary | ICD-10-CM | POA: Insufficient documentation

## 2019-02-08 DIAGNOSIS — Z86711 Personal history of pulmonary embolism: Secondary | ICD-10-CM | POA: Diagnosis not present

## 2019-02-08 DIAGNOSIS — T451X5S Adverse effect of antineoplastic and immunosuppressive drugs, sequela: Secondary | ICD-10-CM | POA: Insufficient documentation

## 2019-02-08 DIAGNOSIS — K1230 Oral mucositis (ulcerative), unspecified: Secondary | ICD-10-CM | POA: Diagnosis not present

## 2019-02-08 DIAGNOSIS — Z923 Personal history of irradiation: Secondary | ICD-10-CM | POA: Diagnosis not present

## 2019-02-08 DIAGNOSIS — R197 Diarrhea, unspecified: Secondary | ICD-10-CM | POA: Insufficient documentation

## 2019-02-08 DIAGNOSIS — C184 Malignant neoplasm of transverse colon: Secondary | ICD-10-CM | POA: Diagnosis not present

## 2019-02-08 DIAGNOSIS — E119 Type 2 diabetes mellitus without complications: Secondary | ICD-10-CM | POA: Diagnosis not present

## 2019-02-08 DIAGNOSIS — Z5111 Encounter for antineoplastic chemotherapy: Secondary | ICD-10-CM | POA: Diagnosis not present

## 2019-02-08 DIAGNOSIS — R188 Other ascites: Secondary | ICD-10-CM | POA: Diagnosis not present

## 2019-02-08 DIAGNOSIS — R42 Dizziness and giddiness: Secondary | ICD-10-CM | POA: Insufficient documentation

## 2019-02-08 DIAGNOSIS — Z79899 Other long term (current) drug therapy: Secondary | ICD-10-CM | POA: Insufficient documentation

## 2019-02-08 DIAGNOSIS — R530 Neoplastic (malignant) related fatigue: Secondary | ICD-10-CM | POA: Diagnosis not present

## 2019-02-08 DIAGNOSIS — E039 Hypothyroidism, unspecified: Secondary | ICD-10-CM | POA: Diagnosis not present

## 2019-02-08 MED ORDER — HEPARIN SOD (PORK) LOCK FLUSH 100 UNIT/ML IV SOLN
500.0000 [IU] | Freq: Once | INTRAVENOUS | Status: AC | PRN
  Administered 2019-02-08: 500 [IU]

## 2019-02-08 MED ORDER — SODIUM CHLORIDE 0.9% FLUSH
10.0000 mL | INTRAVENOUS | Status: DC | PRN
  Administered 2019-02-08: 10 mL
  Filled 2019-02-08: qty 10

## 2019-02-08 NOTE — Patient Instructions (Signed)
[place] at [facility] Discharge Instructions  5FU pump discontinued with portacath flushed per protocol. Follow-up as scheduled. Call clinic for any questions or concerns. Call clinic for any questions or concerns   Thank you for choosing [place] at [facility] to provide your oncology and hematology care.  To afford each patient quality time with our provider, please arrive at least 15 minutes before your scheduled appointment time.   If you have a lab appointment with the [facility] please come in thru the  Main Entrance and check in at the main information desk  You need to re-schedule your appointment should you arrive 10 or more minutes late.  We strive to give you quality time with our providers, and arriving late affects you and other patients whose appointments are after yours.  Also, if you no show three or more times for appointments you may be dismissed from the clinic at the providers discretion.     Again, thank you for choosing [facility].  Our hope is that these requests will decrease the amount of time that you wait before being seen by our physicians.       _____________________________________________________________  Should you have questions after your visit to [facility], please contact our office at [phone] between the hours of 8:00 a.m. and 4:30 p.m.  Voicemails left after 4:00 p.m. will not be returned until the following business day.  For prescription refill requests, have your pharmacy contact our office and allow 72 hours.    Cancer Center Support Programs:   > Cancer Support Group  2nd Tuesday of the month 1pm-2pm, Journey Room

## 2019-02-08 NOTE — Progress Notes (Signed)
[place] tolerated 5FU pump well without complaints or incident 5FU pump discontinued with portacath flushed easily per protocol. VSS Pt discharged self ambulatory in satisfactory condition

## 2019-02-13 ENCOUNTER — Other Ambulatory Visit (HOSPITAL_COMMUNITY): Payer: Self-pay | Admitting: *Deleted

## 2019-02-13 DIAGNOSIS — D649 Anemia, unspecified: Secondary | ICD-10-CM

## 2019-02-13 MED ORDER — FERROUS SULFATE 325 (65 FE) MG PO TABS: 325.0000 mg | ORAL_TABLET | Freq: Two times a day (BID) | ORAL | 3 refills | Status: DC

## 2019-02-13 MED ORDER — MAGIC MOUTHWASH: 5.0000 mL | Freq: Four times a day (QID) | ORAL | 3 refills | Status: DC | PRN

## 2019-02-21 ENCOUNTER — Inpatient Hospital Stay (HOSPITAL_BASED_OUTPATIENT_CLINIC_OR_DEPARTMENT_OTHER): Payer: Medicare Other | Admitting: Hematology

## 2019-02-21 ENCOUNTER — Encounter (HOSPITAL_COMMUNITY): Payer: Self-pay | Admitting: Hematology

## 2019-02-21 ENCOUNTER — Other Ambulatory Visit: Payer: Self-pay

## 2019-02-21 ENCOUNTER — Inpatient Hospital Stay (HOSPITAL_COMMUNITY): Payer: Medicare Other

## 2019-02-21 VITALS — BP 114/57 | HR 63 | Temp 98.2°F | Resp 18 | Wt 190.2 lb

## 2019-02-21 VITALS — BP 122/68 | HR 56 | Temp 97.3°F

## 2019-02-21 DIAGNOSIS — G620 Drug-induced polyneuropathy: Secondary | ICD-10-CM

## 2019-02-21 DIAGNOSIS — C184 Malignant neoplasm of transverse colon: Secondary | ICD-10-CM

## 2019-02-21 DIAGNOSIS — R197 Diarrhea, unspecified: Secondary | ICD-10-CM

## 2019-02-21 DIAGNOSIS — R188 Other ascites: Secondary | ICD-10-CM | POA: Diagnosis not present

## 2019-02-21 DIAGNOSIS — Z5111 Encounter for antineoplastic chemotherapy: Secondary | ICD-10-CM | POA: Diagnosis not present

## 2019-02-21 DIAGNOSIS — K1230 Oral mucositis (ulcerative), unspecified: Secondary | ICD-10-CM

## 2019-02-21 DIAGNOSIS — T451X5S Adverse effect of antineoplastic and immunosuppressive drugs, sequela: Secondary | ICD-10-CM

## 2019-02-21 LAB — COMPREHENSIVE METABOLIC PANEL
ALT: 29 U/L (ref 0–44)
AST: 34 U/L (ref 15–41)
Albumin: 3.5 g/dL (ref 3.5–5.0)
Alkaline Phosphatase: 89 U/L (ref 38–126)
Anion gap: 7 (ref 5–15)
BUN: 16 mg/dL (ref 8–23)
CO2: 21 mmol/L — ABNORMAL LOW (ref 22–32)
Calcium: 8.9 mg/dL (ref 8.9–10.3)
Chloride: 106 mmol/L (ref 98–111)
Creatinine, Ser: 1 mg/dL (ref 0.61–1.24)
GFR calc Af Amer: >60 mL/min (ref >60)
GFR calc non Af Amer: >60 mL/min (ref >60)
Glucose, Bld: 282 mg/dL — ABNORMAL HIGH (ref 70–99)
Potassium: 4.5 mmol/L (ref 3.5–5.1)
Sodium: 134 mmol/L — ABNORMAL LOW (ref 135–145)
Total Bilirubin: 0.8 mg/dL (ref 0.3–1.2)
Total Protein: 6.6 g/dL (ref 6.5–8.1)

## 2019-02-21 LAB — CBC WITH DIFFERENTIAL/PLATELET
Abs Immature Granulocytes: 0.01 10*3/uL (ref 0.00–0.07)
Basophils Absolute: 0 10*3/uL (ref 0.0–0.1)
Basophils Relative: 1 %
Eosinophils Absolute: 0.1 10*3/uL (ref 0.0–0.5)
Eosinophils Relative: 3 %
HCT: 34.4 % — ABNORMAL LOW (ref 39.0–52.0)
Hemoglobin: 11.5 g/dL — ABNORMAL LOW (ref 13.0–17.0)
Immature Granulocytes: 0 %
Lymphocytes Relative: 18 %
Lymphs Abs: 0.7 10*3/uL (ref 0.7–4.0)
MCH: 36.5 pg — ABNORMAL HIGH (ref 26.0–34.0)
MCHC: 33.4 g/dL (ref 30.0–36.0)
MCV: 109.2 fL — ABNORMAL HIGH (ref 80.0–100.0)
Monocytes Absolute: 0.3 10*3/uL (ref 0.1–1.0)
Monocytes Relative: 8 %
Neutro Abs: 2.8 10*3/uL (ref 1.7–7.7)
Neutrophils Relative %: 70 %
Platelets: 100 10*3/uL — ABNORMAL LOW (ref 150–400)
RBC: 3.15 MIL/uL — ABNORMAL LOW (ref 4.22–5.81)
RDW: 14.7 % (ref 11.5–15.5)
WBC: 4 10*3/uL (ref 4.0–10.5)
nRBC: 0 % (ref 0.0–0.2)

## 2019-02-21 LAB — MAGNESIUM: Magnesium: 2.2 mg/dL (ref 1.7–2.4)

## 2019-02-21 MED ORDER — MAGIC MOUTHWASH W/LIDOCAINE: 5.0000 mL | Freq: Four times a day (QID) | ORAL | 0 refills | Status: DC

## 2019-02-21 MED ORDER — SODIUM CHLORIDE 0.9 % IV SOLN
Freq: Once | INTRAVENOUS | Status: AC
  Administered 2019-02-21: 11:00:00 via INTRAVENOUS

## 2019-02-21 MED ORDER — FLUOROURACIL CHEMO INJECTION 2.5 GM/50ML
400.0000 mg/m2 | Freq: Once | INTRAVENOUS | Status: AC
  Administered 2019-02-21: 800 mg via INTRAVENOUS
  Filled 2019-02-21: qty 16

## 2019-02-21 MED ORDER — SODIUM CHLORIDE 0.9 % IV SOLN
8.0000 mg | Freq: Once | INTRAVENOUS | Status: AC
  Administered 2019-02-21: 8 mg via INTRAVENOUS
  Filled 2019-02-21: qty 4

## 2019-02-21 MED ORDER — SODIUM CHLORIDE 0.9% FLUSH
10.0000 mL | INTRAVENOUS | Status: DC | PRN
  Administered 2019-02-21: 10 mL
  Filled 2019-02-21: qty 10

## 2019-02-21 MED ORDER — SODIUM CHLORIDE 0.9 % IV SOLN
2430.0000 mg/m2 | INTRAVENOUS | Status: DC
  Administered 2019-02-21: 5000 mg via INTRAVENOUS
  Filled 2019-02-21: qty 100

## 2019-02-21 MED ORDER — SODIUM CHLORIDE 0.9 % IV SOLN
400.0000 mg/m2 | Freq: Once | INTRAVENOUS | Status: AC
  Administered 2019-02-21: 820 mg via INTRAVENOUS
  Filled 2019-02-21: qty 41

## 2019-02-21 NOTE — Progress Notes (Signed)
Platelets 100 today with oncology follow up visit.  Ok to treat today verbal order Dr. Delton Coombes.   Patient tolerated chemotherapy with no complaints voiced.  Port site clean and dry with no bruising or swelling noted at site.  Good blood return noted before and after administration of chemotherapy.  Chemo pump connected with no alarms noted.  Dressing intact.   Patient left ambulatory with VSS and no s/s of distress noted.

## 2019-02-21 NOTE — Assessment & Plan Note (Addendum)
1.  Stage IIb (T4AN0) transverse colon adenocarcinoma, MSI-stable: - History of colon cancer in June 2008, status post resection, did not receive any adjuvant chemotherapy. -Recurrence in February 2009, underwent resection with colostomy, followed by 6 months of FOLFOX based chemotherapy sandwiching radiation. - Presentation with blood in the colostomy bag, colonoscopy on 08/25/2018 showed obstructing tumor in the mid transverse colon with active bleeding with other parts of colon normal.  Biopsy consistent with adenocarcinoma. - Completion colectomy and end ileostomy, lysis of adhesions and bladder repair on 08/29/2018 by Dr. Constance Haw. -Pathology showed pT4a, PN 0, 0/16 lymph nodes positive, MMR normal.  Margins negative. - PET scan on 09/28/2018 shows no active malignancy.  Mild activity associated with left upper quadrant ascites and anterior abdominal wound sites.  Likely inflammatory.  Liver unremarkable.  CEA was 2.0. -9 cycles of adjuvant chemotherapy with infusional 5-FU and leucovorin from 10/09/2018 through 02/06/2019. - His blood counts are adequate to proceed with cycle 10 without any dose modifications.  We will see him back in 2 weeks for his next cycle.  2.  Oral mucositis: -He had mucositis after last treatment lasting 1 week.  It affected his eating. - We will send prescription for Magic mouthwash, with lidocaine to be used 4 times a day.  3.  Diarrhea: -He has some baseline diarrhea which is unchanged.  He takes up to 4 Imodium tablets daily.  4.  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 -She has used gabapentin in the past.

## 2019-02-21 NOTE — Progress Notes (Signed)
[address], 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[place])   09/29/2018 Cancer Staging   Staging form: Colon and Rectum, AJCC 8th Edition - Clinical stage from 09/29/2018: Stage IIB (cT4a, cN0, cM0) - Signed by Derek Jack, MD on 09/29/2018   10/09/2018 -  Chemotherapy   The patient had ondansetron (ZOFRAN) 8 mg in sodium chloride 0.9 % 50 mL IVPB, 8 mg (100 % of original dose 8 mg), Intravenous,  Once, 9 of 12 cycles Dose modification: 8 mg (original dose 8 mg, Cycle 1) Administration: 8 mg (10/09/2018), 8 mg (10/23/2018), 8 mg (11/06/2018), 8 mg (11/20/2018), 8 mg (12/04/2018), 8 mg (12/18/2018), 8 mg (01/02/2019), 8 mg (01/15/2019), 8 mg (02/06/2019) leucovorin 800 mg in dextrose 5 % 250 mL infusion, 820 mg, Intravenous,  Once, 9 of 12 cycles Administration: 800 mg (10/09/2018), 820 mg (10/23/2018), 820 mg (11/06/2018), 820 mg (11/20/2018), 820 mg (12/04/2018), 820 mg (12/18/2018), 820 mg (01/02/2019), 820 mg (01/15/2019), 820 mg (02/06/2019) fluorouracil (ADRUCIL) chemo injection 800 mg, 400 mg/m2 = 800 mg, Intravenous,  Once, 9 of 12 cycles Administration: 800 mg (10/09/2018), 800 mg (10/23/2018), 800 mg (11/06/2018), 800 mg (11/20/2018), 800 mg (12/04/2018), 800 mg (12/18/2018), 800 mg (01/02/2019), 800 mg (01/15/2019), 800 mg (02/06/2019) fluorouracil (ADRUCIL) 4,900 mg in sodium chloride 0.9 % 52 mL chemo infusion, 2,400 mg/m2 = 4,900 mg, Intravenous, 1 Day/Dose, 9 of 12 cycles Administration: 4,900 mg (10/09/2018), 4,900 mg (10/23/2018), 5,000 mg (11/06/2018), 5,000 mg (11/20/2018), 5,000 mg (12/04/2018), 5,000 mg (12/18/2018), 5,000 mg (01/02/2019), 5,000 mg (01/15/2019), 5,000 mg (02/06/2019)  for  chemotherapy treatment.       CANCER STAGING: Cancer Staging Cancer of transverse colon [facility]) Staging form: Colon and Rectum, AJCC 8th Edition - Clinical stage from 09/29/2018: Stage IIB (cT4a, cN0, cM0) - Signed by Derek Jack, MD on 09/29/2018    INTERVAL HISTORY:  Mr. Chachere 78 y.o. male returns for follow-up of next cycle chemotherapy.  Last cycle was on 02/06/2019.  He had oral mucositis lasting 1 week after last treatment.  Denied any nausea or vomiting.  Diarrhea is stable.  He takes up to 4 Imodium per day.  Numbness in the feet and fingertips has been stable.  Appetite is 75%.  Energy levels are 75%.  He does not report any pain.  No ER visits or hospitalizations.     REVIEW OF SYSTEMS:  Review of Systems  Constitutional: Negative for fatigue.  Neurological: Positive for numbness.  All other systems reviewed and are negative.    PAST MEDICAL/SURGICAL HISTORY:  Past Medical History:  Diagnosis Date  . Anemia    Causing interruption in anticoagulation  . Atrial fibrillation and flutter ([place])   . BPH (benign prostatic hypertrophy)   . Colon cancer ([place])    Status post LAR 2008  . Hypothyroidism   . Lacunar infarction ([place]) 11/2012   RT 3rd NERVE PALSY, SB [name]  . PE (pulmonary embolism)    Temporarily on Eliquis 2017  . Skin cancer    35 years ago  . Type 2 diabetes mellitus ([place])    Past Surgical History:  Procedure Laterality Date  . ABDOMINOPERINEAL PROCTOCOLECTOMY  2009  end colostomy   . BIOPSY  08/25/2018   Procedure: BIOPSY;  Surgeon: Rogene Houston, MD;  Location: [facility] ENDO SUITE;  Service: Endoscopy;;  . BLADDER REPAIR  08/29/2018   Procedure: BLADDER REPAIR;  Surgeon: [name], MD;  Location: [facility] ORS;  Service: General;;  . COLECTOMY  2008  . COLONOSCOPY WITH PROPOFOL N/A 08/25/2018   Procedure: COLONOSCOPY WITH PROPOFOL;  Surgeon: Rogene Houston, MD;  Location: [facility] ENDO SUITE;  Service: Endoscopy;  Laterality: N/A;  2:20   . COLOSTOMY    . EXPLORATORY LAPAROTOMY  2017   ? pneumoperitoneum of unknown etiology/ negative Ex lap  . ILEOSTOMY  08/29/2018   Procedure: ILEOSTOMY;  Surgeon: [name], MD;  Location: [facility] ORS;  Service: General;;  . LAPAROSCOPIC CHOLECYSTECTOMY  2007  . LYSIS OF ADHESION  08/29/2018   Procedure: LYSIS OF ADHESION;  Surgeon: [name], MD;  Location: [facility] ORS;  Service: General;;  . PARTIAL COLECTOMY N/A 08/29/2018   Procedure: PARTIAL COLECTOMY;  Surgeon: [name], MD;  Location: [facility] ORS;  Service: General;  Laterality: N/A;  . PORTACATH PLACEMENT Left 10/04/2018   Procedure: INSERTION PORT-A-CATH (attached catheter left subclavian);  Surgeon: [name], MD;  Location: [facility] ORS;  Service: General;  Laterality: Left;  . SBO SURGERY  06/2009     SOCIAL HISTORY:  Social History   Socioeconomic History  . Marital status: Married    Spouse name: Not on file  . Number of children: Not on file  . Years of education: Not on file  . Highest education level: Not on file  Occupational History  . Occupation: Architect    Comment: [employer]  Social Needs  . Financial resource strain: Not hard at all  . Food insecurity    Worry: Never true    Inability: Never true  . Transportation needs    Medical: No    Non-medical: No  Tobacco Use  . Smoking status: Former Smoker    Packs/day: 1.00    Years: 27.00    Pack years: 27.00    Types: Cigarettes    Start date: 01/27/1964    Quit date: 01/27/1991    Years since quitting: 28.0  . Smokeless tobacco: Former Systems developer    Types: [place] date: 08/10/1995  Substance and Sexual Activity  . Alcohol use: Never    Alcohol/week: 0.0 standard drinks    Frequency: Never  . Drug use: Never  . Sexual activity: Not Currently  Lifestyle  . Physical activity    Days per week: 0 days    Minutes per session: 0 min  . Stress: Not at all  Relationships  . Social Herbalist on phone: Twice a week    Gets  together: Once a week    Attends religious service: Not on file    Active member of club or organization: Not on file    Attends meetings of clubs or organizations: Not on file    Relationship status: Not on file  . Intimate partner violence    Fear of current or ex partner: No    Emotionally abused: No    Physically abused: No    Forced sexual activity: No  Other Topics Concern  . Not on file  Social History Narrative  . Not on file    FAMILY HISTORY:  Family History  Problem Relation Age of Onset  . Heart disease Father   . Heart failure Father   .  Heart attack Father   . Prostate cancer Paternal Uncle     CURRENT MEDICATIONS:  Outpatient Encounter Medications as of 02/21/2019  Medication Sig  . aspirin 81 MG tablet Take 81 mg by mouth daily.  . cetirizine (ZYRTEC) 10 MG tablet Take 10 mg by mouth daily.  . ferrous sulfate (FERROUSUL) 325 (65 FE) MG tablet Take 1 tablet (325 mg total) by mouth 2 (two) times daily with a meal.  . glipiZIDE (GLUCOTROL) 10 MG tablet Take 10 mg by mouth 2 (two) times daily.  Marland Kitchen levothyroxine (SYNTHROID, LEVOTHROID) 50 MCG tablet Take 50 mcg by mouth daily before breakfast.  . lidocaine-prilocaine (EMLA) cream Apply to affected area once  . loperamide (IMODIUM) 2 MG capsule Take 2 capsules (4 mg total) by mouth 4 (four) times daily -  before meals and at bedtime.  . magic mouthwash SOLN Take 5 mLs by mouth 4 (four) times daily as needed for mouth pain.  . metoprolol tartrate (LOPRESSOR) 25 MG tablet Take 1 tablet (25 mg total) by mouth 2 (two) times daily.  . predniSONE (STERAPRED UNI-PAK 21 TAB) 5 MG (21) TBPK tablet See admin instructions.  . prochlorperazine (COMPAZINE) 10 MG tablet Take 1 tablet (10 mg total) by mouth every 6 (six) hours as needed (Nausea or vomiting).  . psyllium (HYDROCIL/METAMUCIL) 95 % PACK Take 1 packet by mouth daily.  . tamsulosin (FLOMAX) 0.4 MG CAPS capsule Take 0.4 mg by mouth at bedtime.   . triamcinolone cream  (KENALOG) 0.1 % APPLY CREAM EXTERNALLY TWICE DAILY  . vitamin C (ASCORBIC ACID) 500 MG tablet Take 1 tablet (500 mg total) by mouth daily.  . magic mouthwash w/lidocaine SOLN Take 5 mLs by mouth 4 (four) times daily.   Facility-Administered Encounter Medications as of 02/21/2019  Medication  . iothalamate meglumine (CYSTO-CONRAY II) 17.2 % solution 250 mL    ALLERGIES:  No Known Allergies   PHYSICAL EXAM:  ECOG Performance status: 2  Vitals:   02/21/19 0912  BP: (!) 114/57  Pulse: 63  Resp: 18  Temp: 98.2 F (36.8 C)  SpO2: 100%   Filed Weights   02/21/19 0912  Weight: 190 lb 3.2 oz (86.3 kg)    Physical Exam Constitutional:      Appearance: Normal appearance. He is normal weight.  Cardiovascular:     Rate and Rhythm: Normal rate and regular rhythm.     Heart sounds: Normal heart sounds.  Pulmonary:     Effort: Pulmonary effort is normal.     Breath sounds: Normal breath sounds.  Abdominal:     General: Bowel sounds are normal.     Palpations: Abdomen is soft.  Musculoskeletal: Normal range of motion.  Skin:    General: Skin is warm and dry.  Neurological:     Mental Status: He is alert and oriented to person, place, and time. Mental status is at baseline.  Psychiatric:        Mood and Affect: Mood normal.        Behavior: Behavior normal.        Thought Content: Thought content normal.        Judgment: Judgment normal.      LABORATORY DATA:  I have reviewed the labs as listed.  CBC    Component Value Date/Time   WBC 4.0 02/21/2019 0857   RBC 3.15 (L) 02/21/2019 0857   HGB 11.5 (L) 02/21/2019 0857   HGB 11.8 (L) 09/14/2018 1514   HCT 34.4 (L) 02/21/2019 1610  HCT 38.4 09/14/2018 1514   PLT 100 (L) 02/21/2019 0857   PLT 468 (H) 09/14/2018 1514   MCV 109.2 (H) 02/21/2019 0857   MCV 87 09/14/2018 1514   MCH 36.5 (H) 02/21/2019 0857   MCHC 33.4 02/21/2019 0857   RDW 14.7 02/21/2019 0857   RDW 14.3 09/14/2018 1514   LYMPHSABS 0.7 02/21/2019 0857    LYMPHSABS 1.8 09/14/2018 1514   MONOABS 0.3 02/21/2019 0857   EOSABS 0.1 02/21/2019 0857   EOSABS 0.6 (H) 09/14/2018 1514   BASOSABS 0.0 02/21/2019 0857   BASOSABS 0.1 09/14/2018 1514   CMP Latest Ref Rng & Units 02/21/2019 02/06/2019 01/29/2019  Glucose 70 - 99 mg/dL 282(H) 184(H) 200(H)  BUN 8 - 23 mg/dL '16 13 11  '$ Creatinine 0.61 - 1.24 mg/dL 1.00 0.95 0.88  Sodium 135 - 145 mmol/L 134(L) 139 136  Potassium 3.5 - 5.1 mmol/L 4.5 4.2 4.2  Chloride 98 - 111 mmol/L 106 107 105  CO2 22 - 32 mmol/L 21(L) 23 23  Calcium 8.9 - 10.3 mg/dL 8.9 8.7(L) 9.3  Total Protein 6.5 - 8.1 g/dL 6.6 6.5 6.8  Total Bilirubin 0.3 - 1.2 mg/dL 0.8 0.7 0.9  Alkaline Phos 38 - 126 U/L 89 83 80  AST 15 - 41 U/L 34 31 31  ALT 0 - 44 U/L '29 21 23       '$ DIAGNOSTIC IMAGING:  I have independently reviewed the scans and discussed with the patient.     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  Stage IIb (T4AN0) transverse colon adenocarcinoma, MSI-stable: - History of colon cancer in June 2008, status post resection, did not receive any adjuvant chemotherapy. -Recurrence in February 2009, underwent resection with colostomy, followed by 6 months of FOLFOX based chemotherapy sandwiching radiation. - Presentation with blood in the colostomy bag, colonoscopy on 08/25/2018 showed obstructing tumor in the mid transverse colon with active bleeding with other parts of colon normal.  Biopsy consistent with adenocarcinoma. - Completion colectomy and end ileostomy, lysis of adhesions and bladder repair on 08/29/2018 by Dr. Constance Haw. -Pathology showed pT4a, PN 0, 0/16 lymph nodes positive, MMR normal.  Margins negative. - PET scan on 09/28/2018 shows no active malignancy.  Mild activity associated with left upper quadrant ascites and anterior abdominal wound sites.  Likely inflammatory.  Liver unremarkable.  CEA was 2.0. -9 cycles of adjuvant chemotherapy with infusional 5-FU and leucovorin from 10/09/2018  through 02/06/2019. - His blood counts are adequate to proceed with cycle 10 without any dose modifications.  We will see him back in 2 weeks for his next cycle.  2.  Oral mucositis: -He had mucositis after last treatment lasting 1 week.  It affected his eating. - We will send prescription for Magic mouthwash, with lidocaine to be used 4 times a day.  3.  Diarrhea: -He has some baseline diarrhea which is unchanged.  He takes up to 4 Imodium tablets daily.  4.  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 -She has used gabapentin in the past.  Total time spent is 25 minutes with more than 50% of the time spent face-to-face discussing treatment plan and counseling and coordination of care.    Orders placed this encounter:  No orders of the defined types were placed in this encounter.     Derek Jack, MD [place] [phone]

## 2019-02-21 NOTE — Patient Instructions (Addendum)
[facility] at [facility] Discharge Instructions  You were seen today by Dr. Katragadda. He went over your recent lab results. He will see you back in 2 weeks for labs and follow up.   Thank you for choosing  Cancer Center at [facility] to provide your oncology and hematology care.  To afford each patient quality time with our provider, please arrive at least 15 minutes before your scheduled appointment time.   If you have a lab appointment with the Cancer Center please come in thru the  Main Entrance and check in at the main information desk  You need to re-schedule your appointment should you arrive 10 or more minutes late.  We strive to give you quality time with our providers, and arriving late affects you and other patients whose appointments are after yours.  Also, if you no show three or more times for appointments you may be dismissed from the clinic at the providers discretion.     Again, thank you for choosing [facility].  Our hope is that these requests will decrease the amount of time that you wait before being seen by our physicians.       _____________________________________________________________  Should you have questions after your visit to [facility], please contact our office at [phone] between the hours of 8:00 a.m. and 4:30 p.m.  Voicemails left after 4:00 p.m. will not be returned until the following business day.  For prescription refill requests, have your pharmacy contact our office and allow 72 hours.    Cancer Center Support Programs:   > Cancer Support Group  2nd Tuesday of the month 1pm-2pm, Journey Room    

## 2019-02-23 ENCOUNTER — Inpatient Hospital Stay (HOSPITAL_COMMUNITY): Payer: Medicare Other

## 2019-02-23 ENCOUNTER — Other Ambulatory Visit: Payer: Self-pay

## 2019-02-23 ENCOUNTER — Encounter (HOSPITAL_COMMUNITY): Payer: Self-pay

## 2019-02-23 VITALS — BP 114/58 | HR 61 | Temp 98.0°F | Resp 18

## 2019-02-23 DIAGNOSIS — Z5111 Encounter for antineoplastic chemotherapy: Secondary | ICD-10-CM | POA: Diagnosis not present

## 2019-02-23 DIAGNOSIS — C184 Malignant neoplasm of transverse colon: Secondary | ICD-10-CM

## 2019-02-23 DIAGNOSIS — K1230 Oral mucositis (ulcerative), unspecified: Secondary | ICD-10-CM | POA: Diagnosis not present

## 2019-02-23 DIAGNOSIS — R197 Diarrhea, unspecified: Secondary | ICD-10-CM | POA: Diagnosis not present

## 2019-02-23 DIAGNOSIS — G620 Drug-induced polyneuropathy: Secondary | ICD-10-CM | POA: Diagnosis not present

## 2019-02-23 DIAGNOSIS — R188 Other ascites: Secondary | ICD-10-CM | POA: Diagnosis not present

## 2019-02-23 MED ORDER — SODIUM CHLORIDE 0.9% FLUSH
10.0000 mL | INTRAVENOUS | Status: DC | PRN
  Administered 2019-02-23: 15:00:00 10 mL
  Filled 2019-02-23: qty 10

## 2019-02-23 MED ORDER — HEPARIN SOD (PORK) LOCK FLUSH 100 UNIT/ML IV SOLN
500.0000 [IU] | Freq: Once | INTRAVENOUS | Status: AC | PRN
  Administered 2019-02-23: 500 [IU]

## 2019-02-23 NOTE — Progress Notes (Signed)
Jose Lawrence tolerated 5FU pump well without complaints or incident. 5FU pump discontinued with portacath flushed easily per protocol. VSS upon discharge. Pt discharged self ambulatory in satisfactory condition

## 2019-02-23 NOTE — Patient Instructions (Signed)
[place] at [facility] Discharge Instructions  5FU pump discontinued with portacath flushed per protocol. Follow-up as scheduled. Call clinic for any questions or concerns   Thank you for choosing [place] at [facility] to provide your oncology and hematology care.  To afford each patient quality time with our provider, please arrive at least 15 minutes before your scheduled appointment time.   If you have a lab appointment with the [facility] please come in thru the  Main Entrance and check in at the main information desk  You need to re-schedule your appointment should you arrive 10 or more minutes late.  We strive to give you quality time with our providers, and arriving late affects you and other patients whose appointments are after yours.  Also, if you no show three or more times for appointments you may be dismissed from the clinic at the providers discretion.     Again, thank you for choosing [facility].  Our hope is that these requests will decrease the amount of time that you wait before being seen by our physicians.       _____________________________________________________________  Should you have questions after your visit to [facility], please contact our office at [phone] between the hours of 8:00 a.m. and 4:30 p.m.  Voicemails left after 4:00 p.m. will not be returned until the following business day.  For prescription refill requests, have your pharmacy contact our office and allow 72 hours.    Cancer Center Support Programs:   > Cancer Support Group  2nd Tuesday of the month 1pm-2pm, Journey Room

## 2019-03-07 ENCOUNTER — Encounter (HOSPITAL_COMMUNITY): Payer: Self-pay | Admitting: Hematology

## 2019-03-07 ENCOUNTER — Inpatient Hospital Stay (HOSPITAL_BASED_OUTPATIENT_CLINIC_OR_DEPARTMENT_OTHER): Payer: Medicare Other | Admitting: Hematology

## 2019-03-07 ENCOUNTER — Inpatient Hospital Stay (HOSPITAL_COMMUNITY): Payer: Medicare Other

## 2019-03-07 ENCOUNTER — Other Ambulatory Visit: Payer: Self-pay

## 2019-03-07 VITALS — BP 115/75 | HR 64 | Temp 96.9°F | Resp 18

## 2019-03-07 DIAGNOSIS — C184 Malignant neoplasm of transverse colon: Secondary | ICD-10-CM

## 2019-03-07 DIAGNOSIS — R188 Other ascites: Secondary | ICD-10-CM | POA: Diagnosis not present

## 2019-03-07 DIAGNOSIS — G620 Drug-induced polyneuropathy: Secondary | ICD-10-CM | POA: Diagnosis not present

## 2019-03-07 DIAGNOSIS — Z5111 Encounter for antineoplastic chemotherapy: Secondary | ICD-10-CM | POA: Diagnosis not present

## 2019-03-07 DIAGNOSIS — K1230 Oral mucositis (ulcerative), unspecified: Secondary | ICD-10-CM | POA: Diagnosis not present

## 2019-03-07 DIAGNOSIS — R197 Diarrhea, unspecified: Secondary | ICD-10-CM | POA: Diagnosis not present

## 2019-03-07 LAB — CBC WITH DIFFERENTIAL/PLATELET
Abs Immature Granulocytes: 0.03 10*3/uL (ref 0.00–0.07)
Basophils Absolute: 0.1 10*3/uL (ref 0.0–0.1)
Basophils Relative: 2 %
Eosinophils Absolute: 0.2 10*3/uL (ref 0.0–0.5)
Eosinophils Relative: 3 %
HCT: 36.8 % — ABNORMAL LOW (ref 39.0–52.0)
Hemoglobin: 12.6 g/dL — ABNORMAL LOW (ref 13.0–17.0)
Immature Granulocytes: 1 %
Lymphocytes Relative: 28 %
Lymphs Abs: 1.3 10*3/uL (ref 0.7–4.0)
MCH: 37.4 pg — ABNORMAL HIGH (ref 26.0–34.0)
MCHC: 34.2 g/dL (ref 30.0–36.0)
MCV: 109.2 fL — ABNORMAL HIGH (ref 80.0–100.0)
Monocytes Absolute: 0.6 10*3/uL (ref 0.1–1.0)
Monocytes Relative: 13 %
Neutro Abs: 2.5 10*3/uL (ref 1.7–7.7)
Neutrophils Relative %: 53 %
Platelets: 83 10*3/uL — ABNORMAL LOW (ref 150–400)
RBC: 3.37 MIL/uL — ABNORMAL LOW (ref 4.22–5.81)
RDW: 15.1 % (ref 11.5–15.5)
WBC: 4.7 10*3/uL (ref 4.0–10.5)
nRBC: 0 % (ref 0.0–0.2)

## 2019-03-07 LAB — COMPREHENSIVE METABOLIC PANEL
ALT: 25 U/L (ref 0–44)
AST: 34 U/L (ref 15–41)
Albumin: 3.8 g/dL (ref 3.5–5.0)
Alkaline Phosphatase: 86 U/L (ref 38–126)
Anion gap: 6 (ref 5–15)
BUN: 15 mg/dL (ref 8–23)
CO2: 24 mmol/L (ref 22–32)
Calcium: 9.2 mg/dL (ref 8.9–10.3)
Chloride: 106 mmol/L (ref 98–111)
Creatinine, Ser: 1.04 mg/dL (ref 0.61–1.24)
GFR calc Af Amer: >60 mL/min (ref >60)
GFR calc non Af Amer: >60 mL/min (ref >60)
Glucose, Bld: 177 mg/dL — ABNORMAL HIGH (ref 70–99)
Potassium: 4.5 mmol/L (ref 3.5–5.1)
Sodium: 136 mmol/L (ref 135–145)
Total Bilirubin: 0.9 mg/dL (ref 0.3–1.2)
Total Protein: 6.7 g/dL (ref 6.5–8.1)

## 2019-03-07 MED ORDER — SODIUM CHLORIDE 0.9 % IV SOLN
2430.0000 mg/m2 | INTRAVENOUS | Status: DC
  Administered 2019-03-07: 11:00:00 5000 mg via INTRAVENOUS
  Filled 2019-03-07: qty 100

## 2019-03-07 MED ORDER — SODIUM CHLORIDE 0.9 % IV SOLN
8.0000 mg | Freq: Once | INTRAVENOUS | Status: AC
  Administered 2019-03-07: 10:00:00 8 mg via INTRAVENOUS
  Filled 2019-03-07: qty 4

## 2019-03-07 MED ORDER — SODIUM CHLORIDE 0.9 % IV SOLN
Freq: Once | INTRAVENOUS | Status: AC
  Administered 2019-03-07: 09:00:00 via INTRAVENOUS

## 2019-03-07 MED ORDER — SODIUM CHLORIDE 0.9% FLUSH
10.0000 mL | INTRAVENOUS | Status: DC | PRN
  Administered 2019-03-07: 10 mL
  Filled 2019-03-07: qty 10

## 2019-03-07 MED ORDER — FLUOROURACIL CHEMO INJECTION 2.5 GM/50ML
400.0000 mg/m2 | Freq: Once | INTRAVENOUS | Status: AC
  Administered 2019-03-07: 11:00:00 800 mg via INTRAVENOUS
  Filled 2019-03-07: qty 16

## 2019-03-07 MED ORDER — SODIUM CHLORIDE 0.9 % IV SOLN
400.0000 mg/m2 | Freq: Once | INTRAVENOUS | Status: AC
  Administered 2019-03-07: 11:00:00 820 mg via INTRAVENOUS
  Filled 2019-03-07: qty 6

## 2019-03-07 NOTE — Patient Instructions (Signed)
[facility] at [facility] Discharge Instructions  Labs drawn from portacath today   Thank you for choosing [facility] at [facility] to provide your oncology and hematology care.  To afford each patient quality time with our provider, please arrive at least 15 minutes before your scheduled appointment time.   If you have a lab appointment with the Cancer Center please come in thru the  Main Entrance and check in at the main information desk  You need to re-schedule your appointment should you arrive 10 or more minutes late.  We strive to give you quality time with our providers, and arriving late affects you and other patients whose appointments are after yours.  Also, if you no show three or more times for appointments you may be dismissed from the clinic at the providers discretion.     Again, thank you for choosing [facility].  Our hope is that these requests will decrease the amount of time that you wait before being seen by our physicians.       _____________________________________________________________  Should you have questions after your visit to [facility], please contact our office at [phone] between the hours of 8:00 a.m. and 4:30 p.m.  Voicemails left after 4:00 p.m. will not be returned until the following business day.  For prescription refill requests, have your pharmacy contact our office and allow 72 hours.    Cancer Center Support Programs:   > Cancer Support Group  2nd Tuesday of the month 1pm-2pm, Journey Room   

## 2019-03-07 NOTE — Patient Instructions (Signed)
[facility] Discharge Instructions for Patients Receiving Chemotherapy   Beginning January 23rd 2017 lab work for the Cancer Center will be done in the  Main lab at  on 1st floor. If you have a lab appointment with the Cancer Center please come in thru the  Main Entrance and check in at the main information desk   Today you received the following chemotherapy agents Leucovorin and 5FU. Follow-up as scheduled. Call clinic for any questions or concerns  To help prevent nausea and vomiting after your treatment, we encourage you to take your nausea medication   If you develop nausea and vomiting, or diarrhea that is not controlled by your medication, call the clinic.  The clinic phone number is [phone]. Office hours are Monday-Friday 8:30am-5:00pm.  BELOW ARE SYMPTOMS THAT SHOULD BE REPORTED IMMEDIATELY:  *FEVER GREATER THAN 101.0 F  *CHILLS WITH OR WITHOUT FEVER  NAUSEA AND VOMITING THAT IS NOT CONTROLLED WITH YOUR NAUSEA MEDICATION  *UNUSUAL SHORTNESS OF BREATH  *UNUSUAL BRUISING OR BLEEDING  TENDERNESS IN MOUTH AND THROAT WITH OR WITHOUT PRESENCE OF ULCERS  *URINARY PROBLEMS  *BOWEL PROBLEMS  UNUSUAL RASH Items with * indicate a potential emergency and should be followed up as soon as possible. If you have an emergency after office hours please contact your primary care physician or go to the nearest emergency department.  Please call the clinic during office hours if you have any questions or concerns.   You may also contact the Patient Navigator at [phone] should you have any questions or need assistance in obtaining follow up care.      Resources For Cancer Patients and their Caregivers ? American Cancer Society: Can assist with transportation, wigs, general needs, runs Look Good Feel Better.        1-888-227-6333 ? Cancer Care: Provides financial assistance, online support groups, medication/co-pay assistance.   1-800-813-HOPE (4673) ? [facility] Assists [place] cancer patients and their families through emotional , educational and financial support.  336-427-4357 ? [place] DSS Where to apply for food stamps, Medicaid and utility assistance. 336-342-1394 ? [facility]: Transportation to medical appointments. 336-347-2287 ? Social Security Administration: May apply for disability if have a Stage IV cancer. 336-342-7796 1-800-772-1213 ? [place] Aging, Disability and Transit Services: Assists with nutrition, care and transit needs. 336-349-2343         

## 2019-03-07 NOTE — Assessment & Plan Note (Addendum)
1.  Stage IIb (T4AN0) transverse colon adenocarcinoma, MSI-stable: - History of colon cancer in June 2008, status post resection, did not receive any adjuvant chemotherapy. -Recurrence in February 2009, underwent resection with colostomy, followed by 6 months of FOLFOX based chemotherapy sandwiching radiation. - Presentation with blood in the colostomy bag, colonoscopy on 08/25/2018 showed obstructing tumor in the mid transverse colon with active bleeding with other parts of colon normal.  Biopsy consistent with adenocarcinoma. - Completion colectomy and end ileostomy, lysis of adhesions and bladder repair on 08/29/2018 by Dr. Constance Haw. -Pathology showed pT4a, PN 0, 0/16 lymph nodes positive, MMR normal.  Margins negative. - PET scan on 09/28/2018 shows no active malignancy.  Mild activity associated with left upper quadrant ascites and anterior abdominal wound sites.  Likely inflammatory.  Liver unremarkable.  CEA was 2.0. -10 cycles of adjuvant chemotherapy with infusional 5-FU and leucovorin from 10/09/2018 through 02/21/2019. - He had mild fatigue after last cycle.  I have reviewed his labs. -She will proceed with cycle 11 today.  We will see him back in 2 weeks for his final treatment.  2.  Oral mucositis: -He has mucositis in the first week for few days.  He will use Magic mouthwash.  3.  Diarrhea: -Baseline diarrhea is unchanged.  He will take 4 Imodium tablets daily.  4.  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 -He has used gabapentin in the past.

## 2019-03-07 NOTE — Progress Notes (Signed)
7824 Labs,including platelets of 83, reviewed with and pt seen by Dr. Delton Coombes and pt approved for chemo tx today per MD                                                                            [place]y [place] tolerated chemo tx well without complaints or incident. Pt discharged with 5FU pump infusing without issues. VSS upon discharge. Pt discharged self ambulatory in satisfactory condition

## 2019-03-07 NOTE — Progress Notes (Signed)
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[place])   09/29/2018 Cancer Staging   Staging form: Colon and Rectum, AJCC 8th Edition - Clinical stage from 09/29/2018: Stage IIB (cT4a, cN0, cM0) - Signed by Derek Jack, MD on 09/29/2018   10/09/2018 -  Chemotherapy   The patient had ondansetron (ZOFRAN) 8 mg in sodium chloride 0.9 % 50 mL IVPB, 8 mg (100 % of original dose 8 mg), Intravenous,  Once, 11 of 12 cycles Dose modification: 8 mg (original dose 8 mg, Cycle 1) Administration: 8 mg (10/09/2018), 8 mg (10/23/2018), 8 mg (11/06/2018), 8 mg (11/20/2018), 8 mg (12/04/2018), 8 mg (12/18/2018), 8 mg (01/02/2019), 8 mg (01/15/2019), 8 mg (02/06/2019), 8 mg (02/21/2019), 8 mg (03/07/2019) leucovorin 800 mg in dextrose 5 % 250 mL infusion, 820 mg, Intravenous,  Once, 11 of 12 cycles Administration: 800 mg (10/09/2018), 820 mg (10/23/2018), 820 mg (11/06/2018), 820 mg (11/20/2018), 820 mg (12/04/2018), 820 mg (12/18/2018), 820 mg (01/02/2019), 820 mg (01/15/2019), 820 mg (02/06/2019), 820 mg (02/21/2019), 820 mg (03/07/2019) fluorouracil (ADRUCIL) chemo injection 800 mg, 400 mg/m2 = 800 mg, Intravenous,  Once, 11 of 12 cycles Administration: 800 mg (10/09/2018), 800 mg (10/23/2018), 800 mg (11/06/2018), 800 mg (11/20/2018), 800 mg (12/04/2018), 800 mg (12/18/2018), 800 mg (01/02/2019), 800 mg (01/15/2019), 800 mg (02/06/2019), 800 mg (02/21/2019), 800 mg (03/07/2019) fluorouracil (ADRUCIL) 4,900 mg in sodium chloride 0.9 % 52 mL chemo infusion, 2,400 mg/m2 = 4,900 mg, Intravenous, 1 Day/Dose, 11 of 12 cycles Administration: 4,900 mg (10/09/2018), 4,900 mg (10/23/2018), 5,000 mg (11/06/2018), 5,000 mg (11/20/2018), 5,000  mg (12/04/2018), 5,000 mg (12/18/2018), 5,000 mg (01/02/2019), 5,000 mg (01/15/2019), 5,000 mg (02/06/2019), 5,000 mg (02/21/2019), 5,000 mg (03/07/2019)  for chemotherapy treatment.       CANCER STAGING: Cancer Staging Cancer of transverse colon [facility]) Staging form: Colon and Rectum, AJCC 8th Edition - Clinical stage from 09/29/2018: Stage IIB (cT4a, cN0, cM0) - Signed by Derek Jack, MD on 09/29/2018    INTERVAL HISTORY:  Jose Lawrence 78 y.o. male seen for cycle 11 of infusional 5-FU and leucovorin.  Appetite is 100%.  Energy levels are 50%.  He had mild fatigue after last cycle lasting few days.  Numbness in the feet has been stable.  Occasional dizziness is also stable.  Denies any nausea or vomiting.  Diarrhea is stable and he takes 4 tablets of Imodium daily.  He did report some mucositis for few days after each treatment.  He is using Magic mouthwash as needed.  No fevers or chills reported.     REVIEW OF SYSTEMS:  Review of Systems  Constitutional: Positive for fatigue.  Neurological: Positive for dizziness and numbness.  All other systems reviewed and are negative.    PAST MEDICAL/SURGICAL HISTORY:  Past Medical History:  Diagnosis Date  . Anemia    Causing interruption in anticoagulation  . Atrial fibrillation and flutter ([place])   . BPH (benign prostatic hypertrophy)   . Colon cancer ([place])    Status post LAR 2008  . Hypothyroidism   . Lacunar infarction ([place]) 11/2012   RT 3rd NERVE PALSY, SB [name]  . PE (pulmonary embolism)    Temporarily  on Eliquis 2017  . Skin cancer    35 years ago  . Type 2 diabetes mellitus ([place])    Past Surgical History:  Procedure Laterality Date  . ABDOMINOPERINEAL PROCTOCOLECTOMY  2009   end colostomy   . BIOPSY  08/25/2018   Procedure: BIOPSY;  Surgeon: Rogene Houston, MD;  [facility] END[facility];  Service: Endoscopy;;  . BLADDER REPAIR  08/29/2018   Procedure: BLADDER REPAIR;  Surgeon: [name], MD;  Location: [facility]  ORS;  Service: General;;  . COLECTOMY  2008  . COLONOSCOPY WITH PROPOFOL N/A 08/25/2018   Procedure: COLONOSCOPY WITH PROPOFOL;  Surgeon: Rogene Houston, MD;  Location: [facility] ENDO SUITE;  Service: Endoscopy;  Laterality: N/A;  2:20  . COLOSTOMY    . EXPLORATORY LAPAROTOMY  2017   ? pneumoperitoneum of unknown etiology/ negative Ex lap  . ILEOSTOMY  08/29/2018   Procedure: ILEOSTOMY;  Surgeon: [name], MD;  Location: [facility] ORS;  Service: General;;  . LAPAROSCOPIC CHOLECYSTECTOMY  2007  . LYSIS OF ADHESION  08/29/2018   Procedure: LYSIS OF ADHESION;  Surgeon: [name], MD;  Location: [facility] ORS;  Service: General;;  . PARTIAL COLECTOMY N/A 08/29/2018   Procedure: PARTIAL COLECTOMY;  Surgeon: [name], MD;  Location: [facility] ORS;  Service: General;  Laterality: N/A;  . PORTACATH PLACEMENT Left 10/04/2018   Procedure: INSERTION PORT-A-CATH (attached catheter left subclavian);  Surgeon: [name], MD;  Location: [facility] ORS;  Service: General;  Laterality: Left;  . SBO SURGERY  06/2009     SOCIAL HISTORY:  Social History   Socioeconomic History  . Marital status: Married    Spouse name: Not on file  . Number of children: Not on file  . Years of education: Not on file  . Highest education level: Not on file  Occupational History  . Occupation: Architect    Comment: [employer]  Social Needs  . Financial resource strain: Not hard at all  . Food insecurity    Worry: Never true    Inability: Never true  . Transportation needs    Medical: No    Non-medical: No  Tobacco Use  . Smoking status: Former Smoker    Packs/day: 1.00    Years: 27.00    Pack years: 27.00    Types: Cigarettes    Start date: 01/27/1964    Quit date: 01/27/1991    Years since quitting: 28.1  . Smokeless tobacco: Former Systems developer    Types: Halls date: 08/10/1995  Substance and Sexual Activity  . Alcohol use: Never    Alcohol/week: 0.0 standard drinks    Frequency: Never  . Drug use: Never   . Sexual activity: Not Currently  Lifestyle  . Physical activity    Days per week: 0 days    Minutes per session: 0 min  . Stress: Not at all  Relationships  . Social Herbalist on phone: Twice a week    Gets together: Once a week    Attends religious service: Not on file    Active member of club or organization: Not on file    Attends meetings of clubs or organizations: Not on file    Relationship status: Not on file  . Intimate partner violence    Fear of current or ex partner: No    Emotionally abused: No    Physically abused: No    Forced sexual activity: No  Other Topics Concern  .  Not on file  Social History Narrative  . Not on file    FAMILY HISTORY:  Family History  Problem Relation Age of Onset  . Heart disease Father   . Heart failure Father   . Heart attack Father   . Prostate cancer Paternal Uncle     CURRENT MEDICATIONS:  Outpatient Encounter Medications as of 03/07/2019  Medication Sig  . aspirin 81 MG tablet Take 81 mg by mouth daily.  . cetirizine (ZYRTEC) 10 MG tablet Take 10 mg by mouth daily.  . ferrous sulfate (FERROUSUL) 325 (65 FE) MG tablet Take 1 tablet (325 mg total) by mouth 2 (two) times daily with a meal.  . glipiZIDE (GLUCOTROL) 10 MG tablet Take 10 mg by mouth 2 (two) times daily.  Marland Kitchen levothyroxine (SYNTHROID, LEVOTHROID) 50 MCG tablet Take 50 mcg by mouth daily before breakfast.  . lidocaine-prilocaine (EMLA) cream Apply to affected area once  . loperamide (IMODIUM) 2 MG capsule Take 2 capsules (4 mg total) by mouth 4 (four) times daily -  before meals and at bedtime.  . magic mouthwash SOLN Take 5 mLs by mouth 4 (four) times daily as needed for mouth pain.  . magic mouthwash w/lidocaine SOLN Take 5 mLs by mouth 4 (four) times daily.  . metoprolol tartrate (LOPRESSOR) 25 MG tablet Take 1 tablet (25 mg total) by mouth 2 (two) times daily.  . predniSONE (STERAPRED UNI-PAK 21 TAB) 5 MG (21) TBPK tablet See admin instructions.  .  prochlorperazine (COMPAZINE) 10 MG tablet Take 1 tablet (10 mg total) by mouth every 6 (six) hours as needed (Nausea or vomiting).  . psyllium (HYDROCIL/METAMUCIL) 95 % PACK Take 1 packet by mouth daily.  . tamsulosin (FLOMAX) 0.4 MG CAPS capsule Take 0.4 mg by mouth at bedtime.   . triamcinolone cream (KENALOG) 0.1 % APPLY CREAM EXTERNALLY TWICE DAILY  . vitamin C (ASCORBIC ACID) 500 MG tablet Take 1 tablet (500 mg total) by mouth daily.   Facility-Administered Encounter Medications as of 03/07/2019  Medication  . iothalamate meglumine (CYSTO-CONRAY II) 17.2 % solution 250 mL    ALLERGIES:  No Known Allergies   PHYSICAL EXAM:  ECOG Performance status: 2  Vitals:   03/07/19 0753  BP: (!) 118/56  Pulse: 64  Resp: 18  Temp: 97.9 F (36.6 C)  SpO2: 100%   Filed Weights   03/07/19 0753  Weight: 190 lb 3.2 oz (86.3 kg)    Physical Exam Constitutional:      Appearance: Normal appearance. He is normal weight.  Cardiovascular:     Rate and Rhythm: Normal rate and regular rhythm.     Heart sounds: Normal heart sounds.  Pulmonary:     Effort: Pulmonary effort is normal.     Breath sounds: Normal breath sounds.  Abdominal:     General: Bowel sounds are normal.     Palpations: Abdomen is soft.  Musculoskeletal: Normal range of motion.  Skin:    General: Skin is warm and dry.  Neurological:     Mental Status: He is alert and oriented to person, place, and time. Mental status is at baseline.  Psychiatric:        Mood and Affect: Mood normal.        Behavior: Behavior normal.        Thought Content: Thought content normal.        Judgment: Judgment normal.      LABORATORY DATA:  I have reviewed the labs as listed.  CBC  Component Value Date/Time   WBC 4.7 03/07/2019 0756   RBC 3.37 (L) 03/07/2019 0756   HGB 12.6 (L) 03/07/2019 0756   HGB 11.8 (L) 09/14/2018 1514   HCT 36.8 (L) 03/07/2019 0756   HCT 38.4 09/14/2018 1514   PLT 83 (L) 03/07/2019 0756   PLT 468 (H)  09/14/2018 1514   MCV 109.2 (H) 03/07/2019 0756   MCV 87 09/14/2018 1514   MCH 37.4 (H) 03/07/2019 0756   MCHC 34.2 03/07/2019 0756   RDW 15.1 03/07/2019 0756   RDW 14.3 09/14/2018 1514   LYMPHSABS 1.3 03/07/2019 0756   LYMPHSABS 1.8 09/14/2018 1514   MONOABS 0.6 03/07/2019 0756   EOSABS 0.2 03/07/2019 0756   EOSABS 0.6 (H) 09/14/2018 1514   BASOSABS 0.1 03/07/2019 0756   BASOSABS 0.1 09/14/2018 1514   CMP Latest Ref Rng & Units 03/07/2019 02/21/2019 02/06/2019  Glucose 70 - 99 mg/dL 177(H) 282(H) 184(H)  BUN 8 - 23 mg/dL _0 Creatinine 0.61 - 1.24 mg/dL 1.04 1.00 0.95  Sodium 135 - 145 mmol/L 136 134(L) 139  Potassium 3.5 - 5.1 mmol/L 4.5 4.5 4.2  Chloride 98 - 111 mmol/L 106 106 107  CO2 22 - 32 mmol/L 24 21(L) 23  Calcium 8.9 - 10.3 mg/dL 9.2 8.9 8.7(L)  Total Protein 6.5 - 8.1 g/dL 6.7 6.6 6.5  Total Bilirubin 0.3 - 1.2 mg/dL 0.9 0.8 0.7  Alkaline Phos 38 - 126 U/L 86 89 83  AST 15 - 41 U/L 34 34 31  ALT 0 - 44 U/L _1 DIAGNOSTIC IMAGING:  I have independently reviewed the scans and discussed with the patient.     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  Stage IIb (T4AN0) transverse colon adenocarcinoma, MSI-stable: - History of colon cancer in June 2008, status post resection, did not receive any adjuvant chemotherapy. -Recurrence in February 2009, underwent resection with colostomy, followed by 6 months of FOLFOX based chemotherapy sandwiching radiation. - Presentation with blood in the colostomy bag, colonoscopy on 08/25/2018 showed obstructing tumor in the mid transverse colon with active bleeding with other parts of colon normal.  Biopsy consistent with adenocarcinoma. - Completion colectomy and end ileostomy, lysis of adhesions and bladder repair on 08/29/2018 by Dr. Constance Haw. -Pathology showed pT4a, PN 0, 0/16 lymph nodes positive, MMR normal.  Margins negative. - PET scan on 09/28/2018 shows no active malignancy.  Mild activity  associated with left upper quadrant ascites and anterior abdominal wound sites.  Likely inflammatory.  Liver unremarkable.  CEA was 2.0. -10 cycles of adjuvant chemotherapy with infusional 5-FU and leucovorin from 10/09/2018 through 02/21/2019. - He had mild fatigue after last cycle.  I have reviewed his labs. -She will proceed with cycle 11 today.  We will see him back in 2 weeks for his final treatment.  2.  Oral mucositis: -He has mucositis in the first week for few days.  He will use Magic mouthwash.  3.  Diarrhea: -Baseline diarrhea is unchanged.  He will take 4 Imodium tablets daily.  4.  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 -He has used gabapentin in the past.  Total time spent is 25 minutes with more than 50% of the time spent face-to-face discussing treatment plan and counseling and coordination of care.    Orders placed this encounter:  No orders of the defined types were placed in  this encounter.     Derek Jack, MD [place] [phone]

## 2019-03-07 NOTE — Patient Instructions (Addendum)
[facility] at [facility] Discharge Instructions  You were seen today by Dr. Katragadda. He went over your recent lab results. He will see you back in 2 weeks for labs and follow up.   Thank you for choosing [facility] at [facility] to provide your oncology and hematology care.  To afford each patient quality time with our provider, please arrive at least 15 minutes before your scheduled appointment time.   If you have a lab appointment with the Cancer Center please come in thru the  Main Entrance and check in at the main information desk  You need to re-schedule your appointment should you arrive 10 or more minutes late.  We strive to give you quality time with our providers, and arriving late affects you and other patients whose appointments are after yours.  Also, if you no show three or more times for appointments you may be dismissed from the clinic at the providers discretion.     Again, thank you for choosing [facility].  Our hope is that these requests will decrease the amount of time that you wait before being seen by our physicians.       _____________________________________________________________  Should you have questions after your visit to [facility], please contact our office at [phone] between the hours of 8:00 a.m. and 4:30 p.m.  Voicemails left after 4:00 p.m. will not be returned until the following business day.  For prescription refill requests, have your pharmacy contact our office and allow 72 hours.    Cancer Center Support Programs:   > Cancer Support Group  2nd Tuesday of the month 1pm-2pm, Journey Room    

## 2019-03-09 ENCOUNTER — Other Ambulatory Visit: Payer: Self-pay

## 2019-03-09 ENCOUNTER — Inpatient Hospital Stay (HOSPITAL_COMMUNITY): Payer: Medicare Other

## 2019-03-09 VITALS — BP 118/60 | HR 75 | Temp 97.8°F | Resp 18

## 2019-03-09 DIAGNOSIS — C184 Malignant neoplasm of transverse colon: Secondary | ICD-10-CM | POA: Diagnosis not present

## 2019-03-09 DIAGNOSIS — Z5111 Encounter for antineoplastic chemotherapy: Secondary | ICD-10-CM | POA: Diagnosis not present

## 2019-03-09 DIAGNOSIS — G620 Drug-induced polyneuropathy: Secondary | ICD-10-CM | POA: Diagnosis not present

## 2019-03-09 DIAGNOSIS — R188 Other ascites: Secondary | ICD-10-CM | POA: Diagnosis not present

## 2019-03-09 DIAGNOSIS — R197 Diarrhea, unspecified: Secondary | ICD-10-CM | POA: Diagnosis not present

## 2019-03-09 DIAGNOSIS — K1230 Oral mucositis (ulcerative), unspecified: Secondary | ICD-10-CM | POA: Diagnosis not present

## 2019-03-09 MED ORDER — HEPARIN SOD (PORK) LOCK FLUSH 100 UNIT/ML IV SOLN
500.0000 [IU] | Freq: Once | INTRAVENOUS | Status: AC | PRN
  Administered 2019-03-09: 500 [IU]

## 2019-03-09 MED ORDER — SODIUM CHLORIDE 0.9% FLUSH
10.0000 mL | INTRAVENOUS | Status: DC | PRN
  Administered 2019-03-09: 10 mL
  Filled 2019-03-09: qty 10

## 2019-03-09 NOTE — Progress Notes (Signed)
Jose Lawrence presents today for 5FU pump discontinuation. Pump discontinued and port flushed with NS and heparin per protocol. Port deaccessed and bandaid applied. VSS. Discharged in satisfactory condition with follow up instructions.

## 2019-03-09 NOTE — Patient Instructions (Signed)
[place] at [facility]  Discharge Instructions:  5FU pump discontinued today and port flushed. _______________________________________________________________  Thank you for choosing [place] at [facility] to provide your oncology and hematology care.  To afford each patient quality time with our providers, please arrive at least 15 minutes before your scheduled appointment.  You need to re-schedule your appointment if you arrive 10 or more minutes late.  We strive to give you quality time with our providers, and arriving late affects you and other patients whose appointments are after yours.  Also, if you no show three or more times for appointments you may be dismissed from the clinic.  Again, thank you for choosing [place] [facility] hope is that these requests will allow you access to exceptional care and in a timely manner. _______________________________________________________________  If you have questions after your visit, please contact our office at [phone] between the hours of 8:30 a.m. and 5:00 p.m. Voicemails left after 4:30 p.m. will not be returned until the following business day. _______________________________________________________________  For prescription refill requests, have your pharmacy contact our office. _______________________________________________________________  Recommendations made by the consultant and any test results will be sent to your referring physician. _______________________________________________________________

## 2019-03-21 ENCOUNTER — Inpatient Hospital Stay (HOSPITAL_BASED_OUTPATIENT_CLINIC_OR_DEPARTMENT_OTHER): Payer: Medicare Other | Admitting: Hematology

## 2019-03-21 ENCOUNTER — Inpatient Hospital Stay (HOSPITAL_COMMUNITY): Payer: Medicare Other

## 2019-03-21 ENCOUNTER — Other Ambulatory Visit: Payer: Self-pay

## 2019-03-21 ENCOUNTER — Inpatient Hospital Stay (HOSPITAL_COMMUNITY): Payer: Medicare Other | Attending: Hematology

## 2019-03-21 VITALS — BP 117/61 | HR 62 | Temp 97.8°F | Resp 18

## 2019-03-21 VITALS — BP 117/71 | HR 65 | Temp 98.2°F | Resp 16 | Wt 192.2 lb

## 2019-03-21 DIAGNOSIS — G620 Drug-induced polyneuropathy: Secondary | ICD-10-CM | POA: Diagnosis not present

## 2019-03-21 DIAGNOSIS — K1230 Oral mucositis (ulcerative), unspecified: Secondary | ICD-10-CM | POA: Insufficient documentation

## 2019-03-21 DIAGNOSIS — C184 Malignant neoplasm of transverse colon: Secondary | ICD-10-CM

## 2019-03-21 DIAGNOSIS — R197 Diarrhea, unspecified: Secondary | ICD-10-CM | POA: Insufficient documentation

## 2019-03-21 DIAGNOSIS — Z79899 Other long term (current) drug therapy: Secondary | ICD-10-CM | POA: Insufficient documentation

## 2019-03-21 DIAGNOSIS — Z5111 Encounter for antineoplastic chemotherapy: Secondary | ICD-10-CM | POA: Insufficient documentation

## 2019-03-21 DIAGNOSIS — T451X5S Adverse effect of antineoplastic and immunosuppressive drugs, sequela: Secondary | ICD-10-CM | POA: Diagnosis not present

## 2019-03-21 DIAGNOSIS — Z923 Personal history of irradiation: Secondary | ICD-10-CM | POA: Diagnosis not present

## 2019-03-21 LAB — CBC WITH DIFFERENTIAL/PLATELET
Abs Immature Granulocytes: 0.01 10*3/uL (ref 0.00–0.07)
Basophils Absolute: 0.1 10*3/uL (ref 0.0–0.1)
Basophils Relative: 2 %
Eosinophils Absolute: 0.2 10*3/uL (ref 0.0–0.5)
Eosinophils Relative: 5 %
HCT: 35.1 % — ABNORMAL LOW (ref 39.0–52.0)
Hemoglobin: 11.9 g/dL — ABNORMAL LOW (ref 13.0–17.0)
Immature Granulocytes: 0 %
Lymphocytes Relative: 23 %
Lymphs Abs: 1 10*3/uL (ref 0.7–4.0)
MCH: 37.2 pg — ABNORMAL HIGH (ref 26.0–34.0)
MCHC: 33.9 g/dL (ref 30.0–36.0)
MCV: 109.7 fL — ABNORMAL HIGH (ref 80.0–100.0)
Monocytes Absolute: 0.5 10*3/uL (ref 0.1–1.0)
Monocytes Relative: 11 %
Neutro Abs: 2.5 10*3/uL (ref 1.7–7.7)
Neutrophils Relative %: 59 %
Platelets: 92 10*3/uL — ABNORMAL LOW (ref 150–400)
RBC: 3.2 MIL/uL — ABNORMAL LOW (ref 4.22–5.81)
RDW: 14.6 % (ref 11.5–15.5)
WBC: 4.3 10*3/uL (ref 4.0–10.5)
nRBC: 0 % (ref 0.0–0.2)

## 2019-03-21 LAB — COMPREHENSIVE METABOLIC PANEL
ALT: 27 U/L (ref 0–44)
AST: 36 U/L (ref 15–41)
Albumin: 3.7 g/dL (ref 3.5–5.0)
Alkaline Phosphatase: 74 U/L (ref 38–126)
Anion gap: 8 (ref 5–15)
BUN: 13 mg/dL (ref 8–23)
CO2: 23 mmol/L (ref 22–32)
Calcium: 9.3 mg/dL (ref 8.9–10.3)
Chloride: 104 mmol/L (ref 98–111)
Creatinine, Ser: 0.96 mg/dL (ref 0.61–1.24)
GFR calc Af Amer: >60 mL/min (ref >60)
GFR calc non Af Amer: >60 mL/min (ref >60)
Glucose, Bld: 166 mg/dL — ABNORMAL HIGH (ref 70–99)
Potassium: 4.5 mmol/L (ref 3.5–5.1)
Sodium: 135 mmol/L (ref 135–145)
Total Bilirubin: 0.9 mg/dL (ref 0.3–1.2)
Total Protein: 6.7 g/dL (ref 6.5–8.1)

## 2019-03-21 MED ORDER — SODIUM CHLORIDE 0.9 % IV SOLN
8.0000 mg | Freq: Once | INTRAVENOUS | Status: AC
  Administered 2019-03-21: 8 mg via INTRAVENOUS
  Filled 2019-03-21: qty 4

## 2019-03-21 MED ORDER — SODIUM CHLORIDE 0.9 % IV SOLN
Freq: Once | INTRAVENOUS | Status: AC
  Administered 2019-03-21: 10:00:00 via INTRAVENOUS

## 2019-03-21 MED ORDER — SODIUM CHLORIDE 0.9% FLUSH
10.0000 mL | INTRAVENOUS | Status: DC | PRN
  Administered 2019-03-21: 10 mL
  Filled 2019-03-21: qty 10

## 2019-03-21 MED ORDER — FLUOROURACIL CHEMO INJECTION 2.5 GM/50ML
400.0000 mg/m2 | Freq: Once | INTRAVENOUS | Status: AC
  Administered 2019-03-21: 12:00:00 800 mg via INTRAVENOUS
  Filled 2019-03-21: qty 16

## 2019-03-21 MED ORDER — SODIUM CHLORIDE 0.9 % IV SOLN
400.0000 mg/m2 | Freq: Once | INTRAVENOUS | Status: AC
  Administered 2019-03-21: 820 mg via INTRAVENOUS
  Filled 2019-03-21: qty 41

## 2019-03-21 MED ORDER — HEPARIN SOD (PORK) LOCK FLUSH 100 UNIT/ML IV SOLN: 500.0000 [IU] | Freq: Once | INTRAVENOUS | Status: DC | PRN

## 2019-03-21 MED ORDER — SODIUM CHLORIDE 0.9 % IV SOLN
2430.0000 mg/m2 | INTRAVENOUS | Status: DC
  Administered 2019-03-21: 5000 mg via INTRAVENOUS
  Filled 2019-03-21: qty 100

## 2019-03-21 NOTE — Progress Notes (Signed)
Platelets 92 today.  Patient seen by Dr. Delton Coombes with lab review and ok to treat verbal order.    Patient tolerated chemotherapy with no complaints voiced.  Port site clean and dry with no bruising or swelling noted at site.  Good blood return noted before and after administration of chemotherapy.  Chemo pump connected with no alarms noted.  Dressing intact.   Patient left ambulatory with VSS and no s/s of distress noted.

## 2019-03-21 NOTE — Patient Instructions (Addendum)
[facility] at [facility] Discharge Instructions  You were seen today by Dr. Katragadda. He went over your recent lab results. He will see you back in 3 weeks for labs and follow up.   Thank you for choosing [facility] at [facility] to provide your oncology and hematology care.  To afford each patient quality time with our provider, please arrive at least 15 minutes before your scheduled appointment time.   If you have a lab appointment with the Cancer Center please come in thru the  Main Entrance and check in at the main information desk  You need to re-schedule your appointment should you arrive 10 or more minutes late.  We strive to give you quality time with our providers, and arriving late affects you and other patients whose appointments are after yours.  Also, if you no show three or more times for appointments you may be dismissed from the clinic at the providers discretion.     Again, thank you for choosing [facility].  Our hope is that these requests will decrease the amount of time that you wait before being seen by our physicians.       _____________________________________________________________  Should you have questions after your visit to [facility], please contact our office at [phone] between the hours of 8:00 a.m. and 4:30 p.m.  Voicemails left after 4:00 p.m. will not be returned until the following business day.  For prescription refill requests, have your pharmacy contact our office and allow 72 hours.    Cancer Center Support Programs:   > Cancer Support Group  2nd Tuesday of the month 1pm-2pm, Journey Room    

## 2019-03-21 NOTE — Progress Notes (Signed)
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[place])   09/29/2018 Cancer Staging   Staging form: Colon and Rectum, AJCC 8th Edition - Clinical stage from 09/29/2018: Stage IIB (cT4a, cN0, cM0) - Signed by Derek Jack, MD on 09/29/2018   10/09/2018 -  Chemotherapy   The patient had ondansetron (ZOFRAN) 8 mg in sodium chloride 0.9 % 50 mL IVPB, 8 mg (100 % of original dose 8 mg), Intravenous,  Once, 12 of 12 cycles Dose modification: 8 mg (original dose 8 mg, Cycle 1) Administration: 8 mg (10/09/2018), 8 mg (10/23/2018), 8 mg (11/06/2018), 8 mg (11/20/2018), 8 mg (12/04/2018), 8 mg (12/18/2018), 8 mg (01/02/2019), 8 mg (01/15/2019), 8 mg (02/06/2019), 8 mg (02/21/2019), 8 mg (03/07/2019), 8 mg (03/21/2019) leucovorin 800 mg in dextrose 5 % 250 mL infusion, 820 mg, Intravenous,  Once, 12 of 12 cycles Administration: 800 mg (10/09/2018), 820 mg (10/23/2018), 820 mg (11/06/2018), 820 mg (11/20/2018), 820 mg (12/04/2018), 820 mg (12/18/2018), 820 mg (01/02/2019), 820 mg (01/15/2019), 820 mg (02/06/2019), 820 mg (02/21/2019), 820 mg (03/07/2019), 820 mg (03/21/2019) fluorouracil (ADRUCIL) chemo injection 800 mg, 400 mg/m2 = 800 mg, Intravenous,  Once, 12 of 12 cycles Administration: 800 mg (10/09/2018), 800 mg (10/23/2018), 800 mg (11/06/2018), 800 mg (11/20/2018), 800 mg (12/04/2018), 800 mg (12/18/2018), 800 mg (01/02/2019), 800 mg (01/15/2019), 800 mg (02/06/2019), 800 mg (02/21/2019), 800 mg (03/07/2019), 800 mg (03/21/2019) fluorouracil (ADRUCIL) 4,900 mg in sodium chloride 0.9 % 52 mL chemo infusion, 2,400 mg/m2 = 4,900 mg, Intravenous, 1 Day/Dose, 12 of 12 cycles Administration: 4,900 mg (10/09/2018), 4,900 mg  (10/23/2018), 5,000 mg (11/06/2018), 5,000 mg (11/20/2018), 5,000 mg (12/04/2018), 5,000 mg (12/18/2018), 5,000 mg (01/02/2019), 5,000 mg (01/15/2019), 5,000 mg (02/06/2019), 5,000 mg (02/21/2019), 5,000 mg (03/07/2019), 5,000 mg (03/21/2019)  for chemotherapy treatment.       CANCER STAGING: Cancer Staging Cancer of transverse colon [facility]) Staging form: Colon and Rectum, AJCC 8th Edition - Clinical stage from 09/29/2018: Stage IIB (cT4a, cN0, cM0) - Signed by Derek Jack, MD on 09/29/2018    INTERVAL HISTORY:  Mr. Scheel 77 y.o. male seen for cycle 12 of chemotherapy.  Cycle 11 was 2 weeks ago.  He did not experience any major side effects including nausea vomiting diarrhea or constipation.  Mouth sores have been stable and controlled with Magic mouthwash.  Appetite is 100%.  Energy levels are 75%.  No pain reported.  Numbness in the fingers and toes has been stable.  No fevers or infections.     REVIEW OF SYSTEMS:  Review of Systems  HENT:   Positive for mouth sores.   Neurological: Positive for numbness.  All other systems reviewed and are negative.    PAST MEDICAL/SURGICAL HISTORY:  Past Medical History:  Diagnosis Date  . Anemia    Causing interruption in anticoagulation  . Atrial fibrillation and flutter ([place])   . BPH (benign prostatic hypertrophy)   . Colon cancer ([place])    Status post LAR 2008  . Hypothyroidism   . Lacunar infarction ([place]) 11/2012   RT 3rd NERVE PALSY, SB [name]  . PE (pulmonary embolism)    Temporarily on Eliquis 2017  . Skin  cancer    35 years ago  . Type 2 diabetes mellitus ([place])    Past Surgical History:  Procedure Laterality Date  . ABDOMINOPERINEAL PROCTOCOLECTOMY  2009   end colostomy   . BIOPSY  08/25/2018   Procedure: BIOPSY;  Surgeon: Rogene Houston, MD;  Location: [facility];  Service: Endoscopy;;  . BLADDER REPAIR  08/29/2018   Procedure: BLADDER REPAIR;  Surgeon: [name], MD;  Location: [facility] ORS;  Service: General;;   . COLECTOMY  2008  . COLONOSCOPY WITH PROPOFOL N/A 08/25/2018   Procedure: COLONOSCOPY WITH PROPOFOL;  Surgeon: Rogene Houston, MD;  Location: [facility] END[facility];  Service: Endoscopy;  Laterality: N/A;  2:20  . COLOSTOMY    . EXPLORATORY LAPAROTOMY  2017   ? pneumoperitoneum of unknown etiology/ negative Ex lap  . ILEOSTOMY  08/29/2018   Procedure: ILEOSTOMY;  Surgeon: [name], MD;  Location: [facility] ORS;  Service: General;;  . LAPAROSCOPIC CHOLECYSTECTOMY  2007  . LYSIS OF ADHESION  08/29/2018   Procedure: LYSIS OF ADHESION;  Surgeon: [name], MD;  Location: [facility] OR[facility];  Service: General;;  . PARTIAL COLECTOMY N/A 08/29/2018   Procedure: PARTIAL COLECTOMY;  Surgeon: [name], MD;  Location: [facility] ORS;  Service: General;  Laterality: N/A;  . PORTACATH PLACEMENT Left 10/04/2018   Procedure: INSERTION PORT-A-CATH (attached catheter left subclavian);  Surgeon: [name], MD;  Location: [facility] ORS;  Service: General;  Laterality: Left;  . SBO SURGERY  06/2009     SOCIAL HISTORY:  Social History   Socioeconomic History  . Marital status: Married    Spouse name: Not on file  . Number of children: Not on file  . Years of education: Not on file  . Highest education level: Not on file  Occupational History  . Occupation: Architect    Comment: [employer]  Social Needs  . Financial resource strain: Not hard at all  . Food insecurity    Worry: Never true    Inability: Never true  . Transportation needs    Medical: No    Non-medical: No  Tobacco Use  . Smoking status: Former Smoker    Packs/day: 1.00    Years: 27.00    Pack years: 27.00    Types: Cigarettes    Start date: 01/27/1964    Quit date: 01/27/1991    Years since quitting: 28.1  . Smokeless tobacco: Former Systems developer    Types: [place] date: 08/10/1995  Substance and Sexual Activity  . Alcohol use: Never    Alcohol/week: 0.0 standard drinks    Frequency: Never  . Drug use: Never  . Sexual activity: Not  Currently  Lifestyle  . Physical activity    Days per week: 0 days    Minutes per session: 0 min  . Stress: Not at all  Relationships  . Social Herbalist on phone: Twice a week    Gets together: Once a week    Attends religious service: Not on file    Active member of club or organization: Not on file    Attends meetings of clubs or organizations: Not on file    Relationship status: Not on file  . Intimate partner violence    Fear of current or ex partner: No    Emotionally abused: No    Physically abused: No    Forced sexual activity: No  Other Topics Concern  . Not on file  Social  History Narrative  . Not on file    FAMILY HISTORY:  Family History  Problem Relation Age of Onset  . Heart disease Father   . Heart failure Father   . Heart attack Father   . Prostate cancer Paternal Uncle     CURRENT MEDICATIONS:  Outpatient Encounter Medications as of 03/21/2019  Medication Sig  . aspirin 81 MG tablet Take 81 mg by mouth daily.  . cetirizine (ZYRTEC) 10 MG tablet Take 10 mg by mouth daily.  . ferrous sulfate (FERROUSUL) 325 (65 FE) MG tablet Take 1 tablet (325 mg total) by mouth 2 (two) times daily with a meal.  . glipiZIDE (GLUCOTROL) 10 MG tablet Take 10 mg by mouth 2 (two) times daily.  Marland Kitchen levothyroxine (SYNTHROID, LEVOTHROID) 50 MCG tablet Take 50 mcg by mouth daily before breakfast.  . lidocaine-prilocaine (EMLA) cream Apply to affected area once  . lisinopril (ZESTRIL) 10 MG tablet Take 10 mg by mouth daily.  Marland Kitchen loperamide (IMODIUM) 2 MG capsule Take 2 capsules (4 mg total) by mouth 4 (four) times daily -  before meals and at bedtime.  . magic mouthwash SOLN Take 5 mLs by mouth 4 (four) times daily as needed for mouth pain.  . magic mouthwash w/lidocaine SOLN Take 5 mLs by mouth 4 (four) times daily.  . metoprolol tartrate (LOPRESSOR) 25 MG tablet Take 1 tablet (25 mg total) by mouth 2 (two) times daily.  . predniSONE (STERAPRED UNI-PAK 21 TAB) 5 MG (21)  TBPK tablet See admin instructions.  . prochlorperazine (COMPAZINE) 10 MG tablet Take 1 tablet (10 mg total) by mouth every 6 (six) hours as needed (Nausea or vomiting).  . psyllium (HYDROCIL/METAMUCIL) 95 % PACK Take 1 packet by mouth daily.  . tamsulosin (FLOMAX) 0.4 MG CAPS capsule Take 0.4 mg by mouth at bedtime.   . triamcinolone cream (KENALOG) 0.1 % APPLY CREAM EXTERNALLY TWICE DAILY  . vitamin C (ASCORBIC ACID) 500 MG tablet Take 1 tablet (500 mg total) by mouth daily.   Facility-Administered Encounter Medications as of 03/21/2019  Medication  . iothalamate meglumine (CYSTO-CONRAY II) 17.2 % solution 250 mL    ALLERGIES:  No Known Allergies   PHYSICAL EXAM:  ECOG Performance status: 2  Vitals:   03/21/19 0907  BP: 117/71  Pulse: 65  Resp: 16  Temp: 98.2 F (36.8 C)   Filed Weights   03/21/19 0907  Weight: 192 lb 3.2 oz (87.2 kg)    Physical Exam Constitutional:      Appearance: Normal appearance. He is normal weight.  Cardiovascular:     Rate and Rhythm: Normal rate and regular rhythm.     Heart sounds: Normal heart sounds.  Pulmonary:     Effort: Pulmonary effort is normal.     Breath sounds: Normal breath sounds.  Abdominal:     General: Bowel sounds are normal.     Palpations: Abdomen is soft.  Musculoskeletal: Normal range of motion.  Skin:    General: Skin is warm and dry.  Neurological:     Mental Status: He is alert and oriented to person, place, and time. Mental status is at baseline.  Psychiatric:        Mood and Affect: Mood normal.        Behavior: Behavior normal.        Thought Content: Thought content normal.        Judgment: Judgment normal.      LABORATORY DATA:  I have reviewed the labs as  listed.  CBC    Component Value Date/Time   WBC 4.3 03/21/2019 0907   RBC 3.20 (L) 03/21/2019 0907   HGB 11.9 (L) 03/21/2019 0907   HGB 11.8 (L) 09/14/2018 1514   HCT 35.1 (L) 03/21/2019 0907   HCT 38.4 09/14/2018 1514   PLT 92 (L)  03/21/2019 0907   PLT 468 (H) 09/14/2018 1514   MCV 109.7 (H) 03/21/2019 0907   MCV 87 09/14/2018 1514   MCH 37.2 (H) 03/21/2019 0907   MCHC 33.9 03/21/2019 0907   RDW 14.6 03/21/2019 0907   RDW 14.3 09/14/2018 1514   LYMPHSABS 1.0 03/21/2019 0907   LYMPHSABS 1.8 09/14/2018 1514   MONOABS 0.5 03/21/2019 0907   EOSABS 0.2 03/21/2019 0907   EOSABS 0.6 (H) 09/14/2018 1514   BASOSABS 0.1 03/21/2019 0907   BASOSABS 0.1 09/14/2018 1514   CMP Latest Ref Rng & Units 03/21/2019 03/07/2019 02/21/2019  Glucose 70 - 99 mg/dL 166(H) 177(H) 282(H)  BUN 8 - 23 mg/dL '13 15 16  '$ Creatinine 0.61 - 1.24 mg/dL 0.96 1.04 1.00  Sodium 135 - 145 mmol/L 135 136 134(L)  Potassium 3.5 - 5.1 mmol/L 4.5 4.5 4.5  Chloride 98 - 111 mmol/L 104 106 106  CO2 22 - 32 mmol/L 23 24 21(L)  Calcium 8.9 - 10.3 mg/dL 9.3 9.2 8.9  Total Protein 6.5 - 8.1 g/dL 6.7 6.7 6.6  Total Bilirubin 0.3 - 1.2 mg/dL 0.9 0.9 0.8  Alkaline Phos 38 - 126 U/L 74 86 89  AST 15 - 41 U/L 36 34 34  ALT 0 - 44 U/L '27 25 29       '$ DIAGNOSTIC IMAGING:  I have independently reviewed the scans and discussed with the patient.     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  Stage IIb (T4AN0) transverse colon adenocarcinoma, MSI-stable: - History of colon cancer in June 2008, status post resection, did not receive any adjuvant chemotherapy. -Recurrence in February 2009, underwent resection with colostomy, followed by 6 months of FOLFOX based chemotherapy sandwiching radiation. - Presentation with blood in the colostomy bag, colonoscopy on 08/25/2018 showed obstructing tumor in the mid transverse colon with active bleeding with other parts of colon normal.  Biopsy consistent with adenocarcinoma. - Completion colectomy and end ileostomy, lysis of adhesions and bladder repair on 08/29/2018 by Dr. Constance Haw. -Pathology showed pT4a, PN 0, 0/16 lymph nodes positive, MMR normal.  Margins negative. - PET scan on 09/28/2018 shows no active  malignancy.  Mild activity associated with left upper quadrant ascites and anterior abdominal wound sites.  Likely inflammatory.  Liver unremarkable.  CEA was 2.0. -11 cycles of adjuvant chemotherapy with infusional 5-FU and leucovorin from 10/09/2018 through 03/07/2019. -He tolerated his last cycle very well.  Denies any major side effects. -We reviewed his blood work.  He will proceed with final cycle of chemotherapy today. -I plan to repeat CT scans in 3 weeks.  2.  Oral mucositis: -He will use Magic mouthwash which is helping.  3.  Diarrhea: -Baseline diarrhea is stable.  He is taking 4 Imodium tablets daily.  4.  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 -He has used gabapentin in the past.  Total time spent is 25 minutes with more than 50% of the time spent face-to-face discussing treatment plan and counseling and coordination of care.    Orders placed this encounter:  Orders Placed This Encounter  Procedures  .  CT Abdomen Pelvis W Contrast  . CEA  . CBC with Differential/Platelet  . Comprehensive metabolic panel      Derek Jack, MD [place] [phone]

## 2019-03-22 ENCOUNTER — Encounter (HOSPITAL_COMMUNITY): Payer: Self-pay | Admitting: Hematology

## 2019-03-22 NOTE — Assessment & Plan Note (Signed)
1.  Stage IIb (T4AN0) transverse colon adenocarcinoma, MSI-stable: - History of colon cancer in June 2008, status post resection, did not receive any adjuvant chemotherapy. -Recurrence in February 2009, underwent resection with colostomy, followed by 6 months of FOLFOX based chemotherapy sandwiching radiation. - Presentation with blood in the colostomy bag, colonoscopy on 08/25/2018 showed obstructing tumor in the mid transverse colon with active bleeding with other parts of colon normal.  Biopsy consistent with adenocarcinoma. - Completion colectomy and end ileostomy, lysis of adhesions and bladder repair on 08/29/2018 by Dr. Constance Haw. -Pathology showed pT4a, PN 0, 0/16 lymph nodes positive, MMR normal.  Margins negative. - PET scan on 09/28/2018 shows no active malignancy.  Mild activity associated with left upper quadrant ascites and anterior abdominal wound sites.  Likely inflammatory.  Liver unremarkable.  CEA was 2.0. -11 cycles of adjuvant chemotherapy with infusional 5-FU and leucovorin from 10/09/2018 through 03/07/2019. -He tolerated his last cycle very well.  Denies any major side effects. -We reviewed his blood work.  He will proceed with final cycle of chemotherapy today. -I plan to repeat CT scans in 3 weeks.  2.  Oral mucositis: -He will use Magic mouthwash which is helping.  3.  Diarrhea: -Baseline diarrhea is stable.  He is taking 4 Imodium tablets daily.  4.  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 -He has used gabapentin in the past.

## 2019-03-23 ENCOUNTER — Other Ambulatory Visit: Payer: Self-pay

## 2019-03-23 ENCOUNTER — Inpatient Hospital Stay (HOSPITAL_COMMUNITY): Payer: Medicare Other

## 2019-03-23 VITALS — BP 135/76 | HR 75 | Temp 97.9°F | Resp 18

## 2019-03-23 DIAGNOSIS — C184 Malignant neoplasm of transverse colon: Secondary | ICD-10-CM | POA: Diagnosis not present

## 2019-03-23 DIAGNOSIS — G620 Drug-induced polyneuropathy: Secondary | ICD-10-CM | POA: Diagnosis not present

## 2019-03-23 DIAGNOSIS — K1230 Oral mucositis (ulcerative), unspecified: Secondary | ICD-10-CM | POA: Diagnosis not present

## 2019-03-23 DIAGNOSIS — T451X5S Adverse effect of antineoplastic and immunosuppressive drugs, sequela: Secondary | ICD-10-CM | POA: Diagnosis not present

## 2019-03-23 DIAGNOSIS — Z5111 Encounter for antineoplastic chemotherapy: Secondary | ICD-10-CM | POA: Diagnosis not present

## 2019-03-23 DIAGNOSIS — R197 Diarrhea, unspecified: Secondary | ICD-10-CM | POA: Diagnosis not present

## 2019-03-23 MED ORDER — HEPARIN SOD (PORK) LOCK FLUSH 100 UNIT/ML IV SOLN
500.0000 [IU] | Freq: Once | INTRAVENOUS | Status: AC | PRN
  Administered 2019-03-23: 500 [IU]

## 2019-03-23 MED ORDER — SODIUM CHLORIDE 0.9% FLUSH
10.0000 mL | INTRAVENOUS | Status: DC | PRN
  Administered 2019-03-23: 10 mL
  Filled 2019-03-23: qty 10

## 2019-03-23 NOTE — Progress Notes (Signed)
Jose Lawrence returns today for port de access and flush after 46 hr continous infusion of 79fu. Tolerated infusion without problems. Portacath located left chest wall was  deaccessed and flushed with 56ml NS and 500U/29ml Heparin and needle removed intact.  Procedure without incident. Patient tolerated procedure well.  Vitals stable and discharged home from clinic ambulatory. Follow up as scheduled.

## 2019-04-06 ENCOUNTER — Encounter (HOSPITAL_COMMUNITY): Payer: Self-pay

## 2019-04-06 ENCOUNTER — Ambulatory Visit (HOSPITAL_COMMUNITY)
Admission: RE | Admit: 2019-04-06 | Discharge: 2019-04-06 | Disposition: A | Discharge status: Home / Self Care | Payer: Medicare Other | Source: Ambulatory Visit | Attending: Hematology | Admitting: Hematology

## 2019-04-06 ENCOUNTER — Inpatient Hospital Stay (HOSPITAL_COMMUNITY): Payer: Medicare Other

## 2019-04-06 ENCOUNTER — Other Ambulatory Visit: Payer: Self-pay

## 2019-04-06 DIAGNOSIS — K1230 Oral mucositis (ulcerative), unspecified: Secondary | ICD-10-CM | POA: Diagnosis not present

## 2019-04-06 DIAGNOSIS — G620 Drug-induced polyneuropathy: Secondary | ICD-10-CM | POA: Diagnosis not present

## 2019-04-06 DIAGNOSIS — Z5111 Encounter for antineoplastic chemotherapy: Secondary | ICD-10-CM | POA: Diagnosis not present

## 2019-04-06 DIAGNOSIS — C184 Malignant neoplasm of transverse colon: Secondary | ICD-10-CM

## 2019-04-06 DIAGNOSIS — T451X5S Adverse effect of antineoplastic and immunosuppressive drugs, sequela: Secondary | ICD-10-CM | POA: Diagnosis not present

## 2019-04-06 DIAGNOSIS — R197 Diarrhea, unspecified: Secondary | ICD-10-CM | POA: Diagnosis not present

## 2019-04-06 DIAGNOSIS — N289 Disorder of kidney and ureter, unspecified: Secondary | ICD-10-CM | POA: Diagnosis not present

## 2019-04-06 LAB — CBC WITH DIFFERENTIAL/PLATELET
Abs Immature Granulocytes: 0.01 10*3/uL (ref 0.00–0.07)
Basophils Absolute: 0.1 10*3/uL (ref 0.0–0.1)
Basophils Relative: 2 %
Eosinophils Absolute: 0.2 10*3/uL (ref 0.0–0.5)
Eosinophils Relative: 3 %
HCT: 39.3 % (ref 39.0–52.0)
Hemoglobin: 13.1 g/dL (ref 13.0–17.0)
Immature Granulocytes: 0 %
Lymphocytes Relative: 27 %
Lymphs Abs: 1.5 10*3/uL (ref 0.7–4.0)
MCH: 37.4 pg — ABNORMAL HIGH (ref 26.0–34.0)
MCHC: 33.3 g/dL (ref 30.0–36.0)
MCV: 112.3 fL — ABNORMAL HIGH (ref 80.0–100.0)
Monocytes Absolute: 0.6 10*3/uL (ref 0.1–1.0)
Monocytes Relative: 11 %
Neutro Abs: 3.2 10*3/uL (ref 1.7–7.7)
Neutrophils Relative %: 57 %
Platelets: 124 10*3/uL — ABNORMAL LOW (ref 150–400)
RBC: 3.5 MIL/uL — ABNORMAL LOW (ref 4.22–5.81)
RDW: 14.8 % (ref 11.5–15.5)
WBC: 5.6 10*3/uL (ref 4.0–10.5)
nRBC: 0.4 % — ABNORMAL HIGH (ref 0.0–0.2)

## 2019-04-06 LAB — COMPREHENSIVE METABOLIC PANEL
ALT: 29 U/L (ref 0–44)
AST: 37 U/L (ref 15–41)
Albumin: 4.1 g/dL (ref 3.5–5.0)
Alkaline Phosphatase: 87 U/L (ref 38–126)
Anion gap: 7 (ref 5–15)
BUN: 18 mg/dL (ref 8–23)
CO2: 26 mmol/L (ref 22–32)
Calcium: 9.8 mg/dL (ref 8.9–10.3)
Chloride: 104 mmol/L (ref 98–111)
Creatinine, Ser: 1.23 mg/dL (ref 0.61–1.24)
GFR calc Af Amer: >60 mL/min (ref >60)
GFR calc non Af Amer: 57 mL/min — ABNORMAL LOW (ref >60)
Glucose, Bld: 134 mg/dL — ABNORMAL HIGH (ref 70–99)
Potassium: 5.5 mmol/L — ABNORMAL HIGH (ref 3.5–5.1)
Sodium: 137 mmol/L (ref 135–145)
Total Bilirubin: 1.4 mg/dL — ABNORMAL HIGH (ref 0.3–1.2)
Total Protein: 7.5 g/dL (ref 6.5–8.1)

## 2019-04-06 MED ORDER — BARIUM SULFATE 2.1 % PO SUSP
ORAL | Status: AC
  Filled 2019-04-06: qty 2

## 2019-04-06 MED ORDER — IOHEXOL 300 MG/ML  SOLN
100.0000 mL | Freq: Once | INTRAMUSCULAR | Status: AC | PRN
  Administered 2019-04-06: 14:00:00 100 mL via INTRAVENOUS

## 2019-04-07 LAB — CEA: CEA: 2.1 ng/mL (ref 0.0–4.7)

## 2019-04-11 ENCOUNTER — Inpatient Hospital Stay (HOSPITAL_COMMUNITY): Payer: Medicare Other | Attending: Hematology | Admitting: Hematology

## 2019-04-11 ENCOUNTER — Encounter (HOSPITAL_COMMUNITY): Payer: Self-pay | Admitting: Lab

## 2019-04-11 ENCOUNTER — Encounter (HOSPITAL_COMMUNITY): Payer: Self-pay | Admitting: Hematology

## 2019-04-11 ENCOUNTER — Other Ambulatory Visit: Payer: Self-pay

## 2019-04-11 VITALS — BP 110/78 | HR 80 | Temp 97.1°F | Resp 16 | Wt 191.0 lb

## 2019-04-11 DIAGNOSIS — Z85828 Personal history of other malignant neoplasm of skin: Secondary | ICD-10-CM | POA: Diagnosis not present

## 2019-04-11 DIAGNOSIS — Z79899 Other long term (current) drug therapy: Secondary | ICD-10-CM | POA: Diagnosis not present

## 2019-04-11 DIAGNOSIS — R197 Diarrhea, unspecified: Secondary | ICD-10-CM | POA: Insufficient documentation

## 2019-04-11 DIAGNOSIS — E039 Hypothyroidism, unspecified: Secondary | ICD-10-CM | POA: Insufficient documentation

## 2019-04-11 DIAGNOSIS — R188 Other ascites: Secondary | ICD-10-CM | POA: Insufficient documentation

## 2019-04-11 DIAGNOSIS — I4891 Unspecified atrial fibrillation: Secondary | ICD-10-CM | POA: Insufficient documentation

## 2019-04-11 DIAGNOSIS — E119 Type 2 diabetes mellitus without complications: Secondary | ICD-10-CM | POA: Insufficient documentation

## 2019-04-11 DIAGNOSIS — Z86711 Personal history of pulmonary embolism: Secondary | ICD-10-CM | POA: Diagnosis not present

## 2019-04-11 DIAGNOSIS — C184 Malignant neoplasm of transverse colon: Secondary | ICD-10-CM | POA: Diagnosis not present

## 2019-04-11 DIAGNOSIS — G629 Polyneuropathy, unspecified: Secondary | ICD-10-CM | POA: Insufficient documentation

## 2019-04-11 DIAGNOSIS — N400 Enlarged prostate without lower urinary tract symptoms: Secondary | ICD-10-CM | POA: Insufficient documentation

## 2019-04-11 DIAGNOSIS — Z7982 Long term (current) use of aspirin: Secondary | ICD-10-CM | POA: Insufficient documentation

## 2019-04-11 DIAGNOSIS — Z8042 Family history of malignant neoplasm of prostate: Secondary | ICD-10-CM | POA: Diagnosis not present

## 2019-04-11 DIAGNOSIS — Z7984 Long term (current) use of oral hypoglycemic drugs: Secondary | ICD-10-CM | POA: Insufficient documentation

## 2019-04-11 DIAGNOSIS — Z87891 Personal history of nicotine dependence: Secondary | ICD-10-CM | POA: Diagnosis not present

## 2019-04-11 DIAGNOSIS — Z933 Colostomy status: Secondary | ICD-10-CM | POA: Insufficient documentation

## 2019-04-11 NOTE — Progress Notes (Signed)
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[place])   09/29/2018 Cancer Staging   Staging form: Colon and Rectum, AJCC 8th Edition - Clinical stage from 09/29/2018: Stage IIB (cT4a, cN0, cM0) - Signed by Derek Jack, MD on 09/29/2018   10/09/2018 -  Chemotherapy   The patient had ondansetron (ZOFRAN) 8 mg in sodium chloride 0.9 % 50 mL IVPB, 8 mg (100 % of original dose 8 mg), Intravenous,  Once, 12 of 12 cycles Dose modification: 8 mg (original dose 8 mg, Cycle 1) Administration: 8 mg (10/09/2018), 8 mg (10/23/2018), 8 mg (11/06/2018), 8 mg (11/20/2018), 8 mg (12/04/2018), 8 mg (12/18/2018), 8 mg (01/02/2019), 8 mg (01/15/2019), 8 mg (02/06/2019), 8 mg (02/21/2019), 8 mg (03/07/2019), 8 mg (03/21/2019) leucovorin 800 mg in dextrose 5 % 250 mL infusion, 820 mg, Intravenous,  Once, 12 of 12 cycles Administration: 800 mg (10/09/2018), 820 mg (10/23/2018), 820 mg (11/06/2018), 820 mg (11/20/2018), 820 mg (12/04/2018), 820 mg (12/18/2018), 820 mg (01/02/2019), 820 mg (01/15/2019), 820 mg (02/06/2019), 820 mg (02/21/2019), 820 mg (03/07/2019), 820 mg (03/21/2019) fluorouracil (ADRUCIL) chemo injection 800 mg, 400 mg/m2 = 800 mg, Intravenous,  Once, 12 of 12 cycles Administration: 800 mg (10/09/2018), 800 mg (10/23/2018), 800 mg (11/06/2018), 800 mg (11/20/2018), 800 mg (12/04/2018), 800 mg (12/18/2018), 800 mg (01/02/2019), 800 mg (01/15/2019), 800 mg (02/06/2019), 800 mg (02/21/2019), 800 mg (03/07/2019), 800 mg (03/21/2019) fluorouracil (ADRUCIL) 4,900 mg in sodium chloride 0.9 % 52 mL chemo infusion, 2,400 mg/m2 = 4,900 mg, Intravenous, 1 Day/Dose, 12 of 12 cycles Administration: 4,900 mg (10/09/2018), 4,900 mg  (10/23/2018), 5,000 mg (11/06/2018), 5,000 mg (11/20/2018), 5,000 mg (12/04/2018), 5,000 mg (12/18/2018), 5,000 mg (01/02/2019), 5,000 mg (01/15/2019), 5,000 mg (02/06/2019), 5,000 mg (02/21/2019), 5,000 mg (03/07/2019), 5,000 mg (03/21/2019)  for chemotherapy treatment.       CANCER STAGING: Cancer Staging Cancer of transverse colon [facility]) Staging form: Colon and Rectum, AJCC 8th Edition - Clinical stage from 09/29/2018: Stage IIB (cT4a, cN0, cM0) - Signed by Derek Jack, MD on 09/29/2018    INTERVAL HISTORY:  Jose Lawrence 78 y.o. male returns for routine follow-up for colon cancer.He reports he is feeling well. He still has easy bruising. He does have mild numbness and tingling in his hands and feet. He still has mild fatigue. Denies any nausea, vomiting, or diarrhea. Denies any new pains. Had not noticed any recent bleeding such as epistaxis, hematuria or hematochezia. Denies recent chest pain on exertion, shortness of breath on minimal exertion, pre-syncopal episodes, or palpitations.  Denies any recent fevers, infections, or recent hospitalizations. Patient reports appetite at 100% and energy level at 50%. He is eating well and maintaining his weight at this time.     REVIEW OF SYSTEMS:  Review of Systems  Constitutional: Positive for fatigue.  Neurological: Positive for numbness.  All other systems reviewed and are negative.    PAST MEDICAL/SURGICAL HISTORY:  Past Medical History:  Diagnosis Date  . Anemia    Causing interruption in anticoagulation  . Atrial fibrillation and flutter ([place])   . BPH (benign prostatic hypertrophy)   . Colon cancer ([place])    Status post LAR 2008  . Hypothyroidism   .  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  . PE (pulmonary embolism)    Temporarily on Eliquis 2017  . Skin cancer    35 years ago  . Type 2 diabetes mellitus ([place])    Past Surgical History:  Procedure Laterality Date  . ABDOMINOPERINEAL PROCTOCOLECTOMY   2009   end colostomy   . BIOPSY  08/25/2018   Procedure: BIOPSY;  Surgeon: Rogene Houston, MD;  Location: [facility] END[facility];  Service: Endoscopy;;  . BLADDER REPAIR  08/29/2018   Procedure: BLADDER REPAIR;  Surgeon: [name], MD;  Location: [facility] ORS;  Service: General;;  . COLECTOMY  2008  . COLONOSCOPY WITH PROPOFOL N/A 08/25/2018   Procedure: COLONOSCOPY WITH PROPOFOL;  Surgeon: Rogene Houston, MD;  Location: [facility] ENDO SUITE;  Service: Endoscopy;  Laterality: N/A;  2:20  . COLOSTOMY    . EXPLORATORY LAPAROTOMY  2017   ? pneumoperitoneum of unknown etiology/ negative Ex lap  . ILEOSTOMY  08/29/2018   Procedure: ILEOSTOMY;  Surgeon: [name], MD;  Location: [facility] ORS;  Service: General;;  . LAPAROSCOPIC CHOLECYSTECTOMY  2007  . LYSIS OF ADHESION  08/29/2018   Procedure: LYSIS OF ADHESION;  Surgeon: [name], MD;  Location: [facility] ORS;  Service: General;;  . PARTIAL COLECTOMY N/A 08/29/2018   Procedure: PARTIAL COLECTOMY;  Surgeon: [name], MD;  Location: [facility] ORS;  Service: General;  Laterality: N/A;  . PORTACATH PLACEMENT Left 10/04/2018   Procedure: INSERTION PORT-A-CATH (attached catheter left subclavian);  Surgeon: [name], MD;  Location: [facility] ORS;  Service: General;  Laterality: Left;  . SBO SURGERY  06/2009     SOCIAL HISTORY:  Social History   Socioeconomic History  . Marital status: Married    Spouse name: Not on file  . Number of children: Not on file  . Years of education: Not on file  . Highest education level: Not on file  Occupational History  . Occupation: Architect    Comment: [employer]  Social Needs  . Financial resource strain: Not hard at all  . Food insecurity    Worry: Never true    Inability: Never true  . Transportation needs    Medical: No    Non-medical: No  Tobacco Use  . Smoking status: Former Smoker    Packs/day: 1.00    Years: 27.00    Pack years: 27.00    Types: Cigarettes    Start date: 01/27/1964    Quit  date: 01/27/1991    Years since quitting: 28.2  . Smokeless tobacco: Former Systems developer    Types: [place] date: 08/10/1995  Substance and Sexual Activity  . Alcohol use: Never    Alcohol/week: 0.0 standard drinks    Frequency: Never  . Drug use: Never  . Sexual activity: Not Currently  Lifestyle  . Physical activity    Days per week: 0 days    Minutes per session: 0 min  . Stress: Not at all  Relationships  . Social Herbalist on phone: Twice a week    Gets together: Once a week    Attends religious service: Not on file    Active member of club or organization: Not on file    Attends meetings of clubs or organizations: Not on file    Relationship status: Not on file  . Intimate partner violence    Fear of current or ex partner: No  Emotionally abused: No    Physically abused: No    Forced sexual activity: No  Other Topics Concern  . Not on file  Social History Narrative  . Not on file    FAMILY HISTORY:  Family History  Problem Relation Age of Onset  . Heart disease Father   . Heart failure Father   . Heart attack Father   . Prostate cancer Paternal Uncle     CURRENT MEDICATIONS:  Outpatient Encounter Medications as of 04/11/2019  Medication Sig  . aspirin 81 MG tablet Take 81 mg by mouth daily.  . cetirizine (ZYRTEC) 10 MG tablet Take 10 mg by mouth daily.  . ferrous sulfate (FERROUSUL) 325 (65 FE) MG tablet Take 1 tablet (325 mg total) by mouth 2 (two) times daily with a meal.  . glipiZIDE (GLUCOTROL) 10 MG tablet Take 10 mg by mouth 2 (two) times daily.  Marland Kitchen levothyroxine (SYNTHROID, LEVOTHROID) 50 MCG tablet Take 50 mcg by mouth daily before breakfast.  . lisinopril (ZESTRIL) 10 MG tablet Take 10 mg by mouth daily.  . metoprolol tartrate (LOPRESSOR) 25 MG tablet Take 1 tablet (25 mg total) by mouth 2 (two) times daily.  . tamsulosin (FLOMAX) 0.4 MG CAPS capsule Take 0.4 mg by mouth at bedtime.   . lidocaine-prilocaine (EMLA) cream Apply to affected area  once (Patient not taking: Reported on 04/11/2019)  . loperamide (IMODIUM) 2 MG capsule Take 2 capsules (4 mg total) by mouth 4 (four) times daily -  before meals and at bedtime. (Patient not taking: Reported on 04/11/2019)  . [DISCONTINUED] magic mouthwash SOLN Take 5 mLs by mouth 4 (four) times daily as needed for mouth pain.  . [DISCONTINUED] magic mouthwash w/lidocaine SOLN Take 5 mLs by mouth 4 (four) times daily.  . [DISCONTINUED] predniSONE (STERAPRED UNI-PAK 21 TAB) 5 MG (21) TBPK tablet See admin instructions.  . [DISCONTINUED] prochlorperazine (COMPAZINE) 10 MG tablet Take 1 tablet (10 mg total) by mouth every 6 (six) hours as needed (Nausea or vomiting).  . [DISCONTINUED] psyllium (HYDROCIL/METAMUCIL) 95 % PACK Take 1 packet by mouth daily.  . [DISCONTINUED] triamcinolone cream (KENALOG) 0.1 % APPLY CREAM EXTERNALLY TWICE DAILY  . [DISCONTINUED] vitamin C (ASCORBIC ACID) 500 MG tablet Take 1 tablet (500 mg total) by mouth daily.   Facility-Administered Encounter Medications as of 04/11/2019  Medication  . iothalamate meglumine (CYSTO-CONRAY II) 17.2 % solution 250 mL    ALLERGIES:  No Known Allergies   PHYSICAL EXAM:  ECOG Performance status: 1  Vitals:   04/11/19 1430  BP: 110/78  Pulse: 80  Resp: 16  Temp: (!) 97.1 F (36.2 C)  SpO2: 98%   Filed Weights   04/11/19 1430  Weight: 191 lb (86.6 kg)    Physical Exam Constitutional:      Appearance: Normal appearance. He is normal weight.  Cardiovascular:     Rate and Rhythm: Normal rate and regular rhythm.     Heart sounds: Normal heart sounds.  Pulmonary:     Effort: Pulmonary effort is normal.     Breath sounds: Normal breath sounds.  Abdominal:     General: Bowel sounds are normal.     Palpations: Abdomen is soft.  Musculoskeletal: Normal range of motion.  Skin:    General: Skin is warm and dry.  Neurological:     Mental Status: He is alert and oriented to person, place, and time. Mental status is at baseline.   Psychiatric:        Mood and  Affect: Mood normal.        Behavior: Behavior normal.        Thought Content: Thought content normal.        Judgment: Judgment normal.      LABORATORY DATA:  I have reviewed the labs as listed.  CBC    Component Value Date/Time   WBC 5.6 04/06/2019 1206   RBC 3.50 (L) 04/06/2019 1206   HGB 13.1 04/06/2019 1206   HGB 11.8 (L) 09/14/2018 1514   HCT 39.3 04/06/2019 1206   HCT 38.4 09/14/2018 1514   PLT 124 (L) 04/06/2019 1206   PLT 468 (H) 09/14/2018 1514   MCV 112.3 (H) 04/06/2019 1206   MCV 87 09/14/2018 1514   MCH 37.4 (H) 04/06/2019 1206   MCHC 33.3 04/06/2019 1206   RDW 14.8 04/06/2019 1206   RDW 14.3 09/14/2018 1514   LYMPHSABS 1.5 04/06/2019 1206   LYMPHSABS 1.8 09/14/2018 1514   MONOABS 0.6 04/06/2019 1206   EOSABS 0.2 04/06/2019 1206   EOSABS 0.6 (H) 09/14/2018 1514   BASOSABS 0.1 04/06/2019 1206   BASOSABS 0.1 09/14/2018 1514   CMP Latest Ref Rng & Units 04/06/2019 03/21/2019 03/07/2019  Glucose 70 - 99 mg/dL 134(H) 166(H) 177(H)  BUN 8 - 23 mg/dL _0 Creatinine 0.61 - 1.24 mg/dL 1.23 0.96 1.04  Sodium 135 - 145 mmol/L 137 135 136  Potassium 3.5 - 5.1 mmol/L 5.5(H) 4.5 4.5  Chloride 98 - 111 mmol/L 104 104 106  CO2 22 - 32 mmol/L _1 Calcium 8.9 - 10.3 mg/dL 9.8 9.3 9.2  Total Protein 6.5 - 8.1 g/dL 7.5 6.7 6.7  Total Bilirubin 0.3 - 1.2 mg/dL 1.4(H) 0.9 0.9  Alkaline Phos 38 - 126 U/L 87 74 86  AST 15 - 41 U/L 37 36 34  ALT 0 - 44 U/L _2 DIAGNOSTIC IMAGING:  I have independently reviewed the scans and discussed with the patient.   I have reviewed Francene Finders, NP's note and agree with the documentation.  I personally performed a face-to-face visit, made revisions and my assessment and plan is as follows.    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  Stage IIb (T4N0) transverse colon adenocarcinoma, MSI stable: - History of colon cancer in June 2008, status post resection,  did not receive any adjuvant chemotherapy. -Recurrence in February 2009, underwent resection with colostomy, followed by 6 months of FOLFOX based chemotherapy sandwiching radiation. - Presentation with blood in the colostomy bag, colonoscopy on 08/25/2018 shows obstructing tumor in the mid transverse colon with active bleeding with other parts of the colon normal.  Biopsy consistent with adenocarcinoma. - Completion colectomy and end ileostomy, lysis of adhesions and bladder repair on 08/29/2018 by Dr. Constance Haw. - Pathology showed pT4a, PN 0, 0/16 lymph nodes positive, MMR normal, margins negative. -PET scan on 09/28/2018 shows no active malignancy.  Mild activity associated with left upper quadrant ascites and anterior abdominal wound sites.  Likely inflammatory.  Liver unremarkable.  CEA was 2.0. - 12 cycles of adjuvant chemotherapy with infusional 5-FU and leucovorin from 10/09/2018 through 03/21/2019. - He has tolerated adjuvant chemotherapy very well. - We reviewed results of blood work from 04/06/2019 which showed CEA of 2.1. -CT abdomen and pelvis on 04/06/2019 shows interval complete colectomy and right lower quadrant ileostomy with no evidence of metastatic disease in the abdomen or pelvis.  Subtle irregular hepatic capsule, probably from mild  cirrhosis.  Right lung base nodularity stable.  Presacral treatment related soft tissue thickening is also stable. -We discussed surveillance plan with blood work and CEA every 3 months.  We will repeat CT abdomen and pelvis every 6 months for first 2 years followed by yearly scans.  We will switch his follow-up visits to every 6 months after 2 years.  2.  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 - He has used gabapentin in the past.  3.  Diarrhea: -Baseline diarrhea is stable.  He is taking 4 Imodium tablets daily.  Total time spent is 25 minutes with more than 50% of the  time spent face-to-face discussing lab results, scan results, surveillance plan, counseling and coordination of care.    Orders placed this encounter:  Orders Placed This Encounter  Procedures  . CBC with Differential/Platelet  . Comprehensive metabolic panel  . CEA  . Lactate dehydrogenase  . VITAMIN D 25 Hydroxy (Vit-D Deficiency, Fractures)  . Vitamin B12      Derek Jack, [place] [phone]

## 2019-04-11 NOTE — Patient Instructions (Signed)
[facility] at [facility] Discharge Instructions  Follow up in 3 months with lab s   Thank you for choosing [facility] at [facility] to provide your oncology and hematology care.  To afford each patient quality time with our provider, please arrive at least 15 minutes before your scheduled appointment time.   If you have a lab appointment with the Cancer Center please come in thru the Main Entrance and check in at the main information desk.  You need to re-schedule your appointment should you arrive 10 or more minutes late.  We strive to give you quality time with our providers, and arriving late affects you and other patients whose appointments are after yours.  Also, if you no show three or more times for appointments you may be dismissed from the clinic at the providers discretion.     Again, thank you for choosing [facility].  Our hope is that these requests will decrease the amount of time that you wait before being seen by our physicians.       _____________________________________________________________  Should you have questions after your visit to  Cancer Center, please contact our office at [phone] between the hours of 8:00 a.m. and 4:30 p.m.  Voicemails left after 4:00 p.m. will not be returned until the following business day.  For prescription refill requests, have your pharmacy contact our office and allow 72 hours.    Due to Covid, you will need to wear a mask upon entering the hospital. If you do not have a mask, a mask will be given to you at the Main Entrance upon arrival. For doctor visits, patients may have 1 support person with them. For treatment visits, patients can not have anyone with them due to social distancing guidelines and our immunocompromised population.      

## 2019-04-11 NOTE — Assessment & Plan Note (Addendum)
1.  Stage IIb (T4N0) transverse colon adenocarcinoma, MSI stable: - History of colon cancer in June 2008, status post resection, did not receive any adjuvant chemotherapy. -Recurrence in February 2009, underwent resection with colostomy, followed by 6 months of FOLFOX based chemotherapy sandwiching radiation. - Presentation with blood in the colostomy bag, colonoscopy on 08/25/2018 shows obstructing tumor in the mid transverse colon with active bleeding with other parts of the colon normal.  Biopsy consistent with adenocarcinoma. - Completion colectomy and end ileostomy, lysis of adhesions and bladder repair on 08/29/2018 by Dr. Constance Haw. - Pathology showed pT4a, PN 0, 0/16 lymph nodes positive, MMR normal, margins negative. -PET scan on 09/28/2018 shows no active malignancy.  Mild activity associated with left upper quadrant ascites and anterior abdominal wound sites.  Likely inflammatory.  Liver unremarkable.  CEA was 2.0. - 12 cycles of adjuvant chemotherapy with infusional 5-FU and leucovorin from 10/09/2018 through 03/21/2019. - He has tolerated adjuvant chemotherapy very well. - We reviewed results of blood work from 04/06/2019 which showed CEA of 2.1. -CT abdomen and pelvis on 04/06/2019 shows interval complete colectomy and right lower quadrant ileostomy with no evidence of metastatic disease in the abdomen or pelvis.  Subtle irregular hepatic capsule, probably from mild cirrhosis.  Right lung base nodularity stable.  Presacral treatment related soft tissue thickening is also stable. -We discussed surveillance plan with blood work and CEA every 3 months.  We will repeat CT abdomen and pelvis every 6 months for first 2 years followed by yearly scans.  We will switch his follow-up visits to every 6 months after 2 years.  2.  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 - He has used gabapentin in the past.  3.   Diarrhea: -Baseline diarrhea is stable.  He is taking 4 Imodium tablets daily.

## 2019-05-03 DIAGNOSIS — I4891 Unspecified atrial fibrillation: Secondary | ICD-10-CM | POA: Diagnosis not present

## 2019-05-03 DIAGNOSIS — I2699 Other pulmonary embolism without acute cor pulmonale: Secondary | ICD-10-CM | POA: Diagnosis not present

## 2019-05-03 DIAGNOSIS — E1165 Type 2 diabetes mellitus with hyperglycemia: Secondary | ICD-10-CM | POA: Diagnosis not present

## 2019-05-03 DIAGNOSIS — I10 Essential (primary) hypertension: Secondary | ICD-10-CM | POA: Diagnosis not present

## 2019-05-03 DIAGNOSIS — Z299 Encounter for prophylactic measures, unspecified: Secondary | ICD-10-CM | POA: Diagnosis not present

## 2019-05-03 DIAGNOSIS — Z6827 Body mass index (BMI) 27.0-27.9, adult: Secondary | ICD-10-CM | POA: Diagnosis not present

## 2019-05-03 DIAGNOSIS — F419 Anxiety disorder, unspecified: Secondary | ICD-10-CM | POA: Diagnosis not present

## 2019-05-17 DIAGNOSIS — Z23 Encounter for immunization: Secondary | ICD-10-CM | POA: Diagnosis not present

## 2019-05-21 DIAGNOSIS — Z299 Encounter for prophylactic measures, unspecified: Secondary | ICD-10-CM | POA: Diagnosis not present

## 2019-05-21 DIAGNOSIS — I10 Essential (primary) hypertension: Secondary | ICD-10-CM | POA: Diagnosis not present

## 2019-05-21 DIAGNOSIS — Z6827 Body mass index (BMI) 27.0-27.9, adult: Secondary | ICD-10-CM | POA: Diagnosis not present

## 2019-05-21 DIAGNOSIS — L820 Inflamed seborrheic keratosis: Secondary | ICD-10-CM | POA: Diagnosis not present

## 2019-05-21 DIAGNOSIS — L821 Other seborrheic keratosis: Secondary | ICD-10-CM | POA: Diagnosis not present

## 2019-06-21 ENCOUNTER — Encounter (HOSPITAL_COMMUNITY): Payer: Self-pay

## 2019-06-21 ENCOUNTER — Inpatient Hospital Stay (HOSPITAL_COMMUNITY): Payer: Medicare Other | Attending: Hematology

## 2019-06-21 ENCOUNTER — Other Ambulatory Visit: Payer: Self-pay

## 2019-06-21 DIAGNOSIS — R197 Diarrhea, unspecified: Secondary | ICD-10-CM | POA: Diagnosis not present

## 2019-06-21 DIAGNOSIS — Z7984 Long term (current) use of oral hypoglycemic drugs: Secondary | ICD-10-CM | POA: Insufficient documentation

## 2019-06-21 DIAGNOSIS — N400 Enlarged prostate without lower urinary tract symptoms: Secondary | ICD-10-CM | POA: Insufficient documentation

## 2019-06-21 DIAGNOSIS — Z7982 Long term (current) use of aspirin: Secondary | ICD-10-CM | POA: Insufficient documentation

## 2019-06-21 DIAGNOSIS — Z85828 Personal history of other malignant neoplasm of skin: Secondary | ICD-10-CM | POA: Diagnosis not present

## 2019-06-21 DIAGNOSIS — I4891 Unspecified atrial fibrillation: Secondary | ICD-10-CM | POA: Insufficient documentation

## 2019-06-21 DIAGNOSIS — Z87891 Personal history of nicotine dependence: Secondary | ICD-10-CM | POA: Insufficient documentation

## 2019-06-21 DIAGNOSIS — Z933 Colostomy status: Secondary | ICD-10-CM | POA: Insufficient documentation

## 2019-06-21 DIAGNOSIS — E119 Type 2 diabetes mellitus without complications: Secondary | ICD-10-CM | POA: Diagnosis not present

## 2019-06-21 DIAGNOSIS — R188 Other ascites: Secondary | ICD-10-CM | POA: Diagnosis not present

## 2019-06-21 DIAGNOSIS — E039 Hypothyroidism, unspecified: Secondary | ICD-10-CM | POA: Insufficient documentation

## 2019-06-21 DIAGNOSIS — G629 Polyneuropathy, unspecified: Secondary | ICD-10-CM | POA: Insufficient documentation

## 2019-06-21 DIAGNOSIS — Z79899 Other long term (current) drug therapy: Secondary | ICD-10-CM | POA: Insufficient documentation

## 2019-06-21 DIAGNOSIS — Z86711 Personal history of pulmonary embolism: Secondary | ICD-10-CM | POA: Diagnosis not present

## 2019-06-21 DIAGNOSIS — C184 Malignant neoplasm of transverse colon: Secondary | ICD-10-CM | POA: Diagnosis not present

## 2019-06-21 LAB — CBC WITH DIFFERENTIAL/PLATELET
Abs Immature Granulocytes: 0.01 10*3/uL (ref 0.00–0.07)
Basophils Absolute: 0 10*3/uL (ref 0.0–0.1)
Basophils Relative: 1 %
Eosinophils Absolute: 0.2 10*3/uL (ref 0.0–0.5)
Eosinophils Relative: 4 %
HCT: 41.2 % (ref 39.0–52.0)
Hemoglobin: 13.6 g/dL (ref 13.0–17.0)
Immature Granulocytes: 0 %
Lymphocytes Relative: 26 %
Lymphs Abs: 1.1 10*3/uL (ref 0.7–4.0)
MCH: 34.1 pg — ABNORMAL HIGH (ref 26.0–34.0)
MCHC: 33 g/dL (ref 30.0–36.0)
MCV: 103.3 fL — ABNORMAL HIGH (ref 80.0–100.0)
Monocytes Absolute: 0.4 10*3/uL (ref 0.1–1.0)
Monocytes Relative: 9 %
Neutro Abs: 2.5 10*3/uL (ref 1.7–7.7)
Neutrophils Relative %: 60 %
Platelets: 103 10*3/uL — ABNORMAL LOW (ref 150–400)
RBC: 3.99 MIL/uL — ABNORMAL LOW (ref 4.22–5.81)
RDW: 11.9 % (ref 11.5–15.5)
WBC: 4.2 10*3/uL (ref 4.0–10.5)
nRBC: 0 % (ref 0.0–0.2)

## 2019-06-21 LAB — COMPREHENSIVE METABOLIC PANEL
ALT: 25 U/L (ref 0–44)
AST: 32 U/L (ref 15–41)
Albumin: 3.8 g/dL (ref 3.5–5.0)
Alkaline Phosphatase: 109 U/L (ref 38–126)
Anion gap: 10 (ref 5–15)
BUN: 16 mg/dL (ref 8–23)
CO2: 24 mmol/L (ref 22–32)
Calcium: 9.4 mg/dL (ref 8.9–10.3)
Chloride: 104 mmol/L (ref 98–111)
Creatinine, Ser: 1.01 mg/dL (ref 0.61–1.24)
GFR calc Af Amer: >60 mL/min (ref >60)
GFR calc non Af Amer: >60 mL/min (ref >60)
Glucose, Bld: 143 mg/dL — ABNORMAL HIGH (ref 70–99)
Potassium: 4.3 mmol/L (ref 3.5–5.1)
Sodium: 138 mmol/L (ref 135–145)
Total Bilirubin: 0.8 mg/dL (ref 0.3–1.2)
Total Protein: 7.1 g/dL (ref 6.5–8.1)

## 2019-06-21 LAB — VITAMIN B12: Vitamin B-12: 175 pg/mL — ABNORMAL LOW (ref 180–914)

## 2019-06-21 LAB — LACTATE DEHYDROGENASE: LDH: 142 U/L (ref 98–192)

## 2019-06-21 LAB — VITAMIN D 25 HYDROXY (VIT D DEFICIENCY, FRACTURES): Vit D, 25-Hydroxy: 16.11 ng/mL — ABNORMAL LOW (ref 30–100)

## 2019-06-21 MED ORDER — SODIUM CHLORIDE 0.9% FLUSH
10.0000 mL | Freq: Once | INTRAVENOUS | Status: AC
  Administered 2019-06-21: 10:00:00 10 mL via INTRAVENOUS

## 2019-06-21 MED ORDER — HEPARIN SOD (PORK) LOCK FLUSH 100 UNIT/ML IV SOLN
500.0000 [IU] | Freq: Once | INTRAVENOUS | Status: AC
  Administered 2019-06-21: 10:00:00 500 [IU] via INTRAVENOUS

## 2019-06-21 NOTE — Progress Notes (Signed)
Labs drawn from port for next oncology follow up visit.  Patients port flushed without difficulty.  Good blood return noted with no bruising or swelling noted at site.  Band aid applied.  VSS with discharge and left ambulatory with no s/s of distress noted.

## 2019-06-22 LAB — CEA: CEA: 2.1 ng/mL (ref 0.0–4.7)

## 2019-07-04 ENCOUNTER — Ambulatory Visit (HOSPITAL_COMMUNITY): Payer: Medicare Other | Admitting: Hematology

## 2019-07-09 ENCOUNTER — Ambulatory Visit (HOSPITAL_COMMUNITY): Payer: Medicare Other | Admitting: Nurse Practitioner

## 2019-07-10 ENCOUNTER — Inpatient Hospital Stay (HOSPITAL_COMMUNITY): Payer: Medicare Other | Attending: Hematology | Admitting: Nurse Practitioner

## 2019-07-10 ENCOUNTER — Other Ambulatory Visit: Payer: Self-pay

## 2019-07-10 ENCOUNTER — Encounter (HOSPITAL_COMMUNITY): Payer: Self-pay | Admitting: Nurse Practitioner

## 2019-07-10 VITALS — BP 140/65 | HR 44 | Temp 96.9°F | Resp 18 | Wt 198.6 lb

## 2019-07-10 DIAGNOSIS — Z9221 Personal history of antineoplastic chemotherapy: Secondary | ICD-10-CM | POA: Insufficient documentation

## 2019-07-10 DIAGNOSIS — E039 Hypothyroidism, unspecified: Secondary | ICD-10-CM | POA: Insufficient documentation

## 2019-07-10 DIAGNOSIS — C184 Malignant neoplasm of transverse colon: Secondary | ICD-10-CM

## 2019-07-10 DIAGNOSIS — N400 Enlarged prostate without lower urinary tract symptoms: Secondary | ICD-10-CM | POA: Diagnosis not present

## 2019-07-10 DIAGNOSIS — Z933 Colostomy status: Secondary | ICD-10-CM | POA: Insufficient documentation

## 2019-07-10 DIAGNOSIS — Z7984 Long term (current) use of oral hypoglycemic drugs: Secondary | ICD-10-CM | POA: Insufficient documentation

## 2019-07-10 DIAGNOSIS — I4891 Unspecified atrial fibrillation: Secondary | ICD-10-CM | POA: Diagnosis not present

## 2019-07-10 DIAGNOSIS — Z86711 Personal history of pulmonary embolism: Secondary | ICD-10-CM | POA: Insufficient documentation

## 2019-07-10 DIAGNOSIS — Z7982 Long term (current) use of aspirin: Secondary | ICD-10-CM | POA: Diagnosis not present

## 2019-07-10 DIAGNOSIS — Z923 Personal history of irradiation: Secondary | ICD-10-CM | POA: Diagnosis not present

## 2019-07-10 DIAGNOSIS — Z8673 Personal history of transient ischemic attack (TIA), and cerebral infarction without residual deficits: Secondary | ICD-10-CM | POA: Insufficient documentation

## 2019-07-10 DIAGNOSIS — E559 Vitamin D deficiency, unspecified: Secondary | ICD-10-CM | POA: Diagnosis not present

## 2019-07-10 DIAGNOSIS — E538 Deficiency of other specified B group vitamins: Secondary | ICD-10-CM

## 2019-07-10 DIAGNOSIS — Z79899 Other long term (current) drug therapy: Secondary | ICD-10-CM | POA: Diagnosis not present

## 2019-07-10 DIAGNOSIS — E1142 Type 2 diabetes mellitus with diabetic polyneuropathy: Secondary | ICD-10-CM | POA: Diagnosis not present

## 2019-07-10 DIAGNOSIS — F17210 Nicotine dependence, cigarettes, uncomplicated: Secondary | ICD-10-CM | POA: Diagnosis not present

## 2019-07-10 DIAGNOSIS — R197 Diarrhea, unspecified: Secondary | ICD-10-CM | POA: Diagnosis not present

## 2019-07-10 HISTORY — DX: Deficiency of other specified B group vitamins: E53.8

## 2019-07-10 MED ORDER — CYANOCOBALAMIN 1000 MCG/ML IJ SOLN
1000.0000 ug | Freq: Once | INTRAMUSCULAR | Status: AC
  Administered 2019-07-10: 1000 ug via INTRAMUSCULAR

## 2019-07-10 MED ORDER — CYANOCOBALAMIN 1000 MCG/ML IJ SOLN
INTRAMUSCULAR | Status: AC
  Filled 2019-07-10: qty 1

## 2019-07-10 MED ORDER — ERGOCALCIFEROL 1.25 MG (50000 UT) PO CAPS: 50000.0000 [IU] | ORAL_CAPSULE | ORAL | 4 refills | Status: DC

## 2019-07-10 NOTE — Assessment & Plan Note (Signed)
1.  Stage IIb (T4N0) transverse colon adenocarcinoma, MSI stable ( -History of colon cancer in June 2008, status post resection, did not receive any adjuvant chemotherapy. -Recurrence in February 2009, underwent resection with colostomy, followed by 6 months of FOLFOX based chemotherapy sandwiching radiation. -Presentation with blood in the colostomy bag, colonoscopy on 08/25/2018 showing obstructing tumor in the mid transverse colon with active bleeding with other parts of the colon normal.  Biopsy consistent with adenocarcinoma. -Completion colectomy and end ileostomy, lysis of adhesions and bladder repair on 08/29/2018 by Dr. Constance Haw. -Pathology showed pT4a, PN oh, 0/16 lymph nodes positive, MMR normal, margins negative. -PET scan on 09/28/2018 showed no active malignancy.  Mild activity associated with left upper quadrant ascites and anterior abdominal wound sites.  Likely inflammatory, liver unremarkable, CEA was 2.0. -12 cycles of adjuvant chemotherapy with infusional 5-FU and leucovorin from 10/09/2018-03/21/2019. -He has tolerated adjuvant chemotherapy well. -Labs from 04/06/2019 showed CEA level 2.1. -CT AP on 04/06/2019 showed interval complete colectomy and right lower quadrant ileostomy with no evidence of metastatic disease in the abdomen or pelvis.  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  Right lung base nodularity stable.  Presacral treatment related soft tissue thickening is also stable. -Labs on 06/21/2019 showed potassium 4.3, creatinine 1.01, LDH 142, WBC 4.2, hemoglobin 13.6, platelets 23. -Surveillance plan is blood work and CEA level every 3 months.  We will repeat CT abdomen pelvis every 6 months for the first 2 years followed by yearly scans.  We will switch his follow-up visits to every 6 months after 2 years. -He will return to the clinic in 3 months with labs and a CT scan.  2.  Peripheral neuropathy: -He has some residual neuropathy in his feet and  fingertips from the oxaliplatin chemotherapy previously used. -He has used gabapentin in the past which helped.  3.  Diarrhea: -Baseline diarrhea is stable.  He is taking 4-8 Imodium tablets daily.  4.  Vitamin D deficiency: -Labs on 06/21/2019 showed vitamin D level 16.11 -We called him in a prescription for vitamin D 50,000 units weekly. -We will recheck his levels at his next visit  5.  Vitamin B12 deficiency: -Labs on 06/21/2019 showed his vitamin B12 level 175 -We will give him vitamin B12 injection today -He will start taking oral B12 daily.

## 2019-07-10 NOTE — Progress Notes (Signed)
[place] tolerated Vit B12 injection well without complaints or incident. Pt discharged self ambulatory in satisfactory condition

## 2019-07-10 NOTE — Progress Notes (Signed)
[place], [place] [address]   CLINIC:  Medical Oncology/Hematology  PCP:  Glenda Chroman, MD [place] [address] [phone]   REASON FOR VISIT: Follow-up for transverse colon cancer  CURRENT THERAPY: Surveillance per NCCN guidelines  BRIEF ONCOLOGIC HISTORY:  Oncology History  Cancer of transverse colon ([place])  09/23/2018 Initial Diagnosis   Cancer of transverse colon ([place])   09/29/2018 Cancer Staging   Staging form: Colon and Rectum, AJCC 8th Edition - Clinical stage from 09/29/2018: Stage IIB (cT4a, cN0, cM0) - Signed by Derek Jack, MD on 09/29/2018   10/09/2018 -  Chemotherapy   The patient had ondansetron (ZOFRAN) 8 mg in sodium chloride 0.9 % 50 mL IVPB, 8 mg (100 % of original dose 8 mg), Intravenous,  Once, 12 of 12 cycles Dose modification: 8 mg (original dose 8 mg, Cycle 1) Administration: 8 mg (10/09/2018), 8 mg (10/23/2018), 8 mg (11/06/2018), 8 mg (11/20/2018), 8 mg (12/04/2018), 8 mg (12/18/2018), 8 mg (01/02/2019), 8 mg (01/15/2019), 8 mg (02/06/2019), 8 mg (02/21/2019), 8 mg (03/07/2019), 8 mg (03/21/2019) leucovorin 800 mg in dextrose 5 % 250 mL infusion, 820 mg, Intravenous,  Once, 12 of 12 cycles Administration: 800 mg (10/09/2018), 820 mg (10/23/2018), 820 mg (11/06/2018), 820 mg (11/20/2018), 820 mg (12/04/2018), 820 mg (12/18/2018), 820 mg (01/02/2019), 820 mg (01/15/2019), 820 mg (02/06/2019), 820 mg (02/21/2019), 820 mg (03/07/2019), 820 mg (03/21/2019) fluorouracil (ADRUCIL) chemo injection 800 mg, 400 mg/m2 = 800 mg, Intravenous,  Once, 12 of 12 cycles Administration: 800 mg (10/09/2018), 800 mg (10/23/2018), 800 mg (11/06/2018), 800 mg (11/20/2018), 800 mg (12/04/2018), 800 mg (12/18/2018), 800 mg (01/02/2019), 800 mg (01/15/2019), 800 mg (02/06/2019), 800 mg (02/21/2019), 800 mg (03/07/2019), 800 mg (03/21/2019) fluorouracil (ADRUCIL) 4,900 mg in sodium chloride 0.9 % 52 mL chemo infusion, 2,400 mg/m2 = 4,900 mg, Intravenous, 1 Day/Dose, 12 of  12 cycles Administration: 4,900 mg (10/09/2018), 4,900 mg (10/23/2018), 5,000 mg (11/06/2018), 5,000 mg (11/20/2018), 5,000 mg (12/04/2018), 5,000 mg (12/18/2018), 5,000 mg (01/02/2019), 5,000 mg (01/15/2019), 5,000 mg (02/06/2019), 5,000 mg (02/21/2019), 5,000 mg (03/07/2019), 5,000 mg (03/21/2019)  for chemotherapy treatment.       CANCER STAGING: Cancer Staging Cancer of transverse colon [facility]) Staging form: Colon and Rectum, AJCC 8th Edition - Clinical stage from 09/29/2018: Stage IIB (cT4a, cN0, cM0) - Signed by Derek Jack, MD on 09/29/2018    INTERVAL HISTORY:  Jose Lawrence 78 y.o. male returns for routine follow-up for transverse colon cancer.  Patient reports he has been doing well since his last visit.  He does report he is still having some diarrhea.  He is taking Imodium 4 to 8 tablets daily which helps.  He denies any bleeding in his bag.  He denies any abdominal pain. Denies any nausea, vomiting, or diarrhea. Denies any new pains. Had not noticed any recent bleeding such as epistaxis, hematuria or hematochezia. Denies recent chest pain on exertion, shortness of breath on minimal exertion, pre-syncopal episodes, or palpitations. Denies any numbness or tingling in hands or feet. Denies any recent fevers, infections, or recent hospitalizations. Patient reports appetite at 100% and energy level at 75%.  He is eating well maintain his weight at this time.    REVIEW OF SYSTEMS:  Review of Systems  Gastrointestinal: Positive for diarrhea.  All other systems reviewed and are negative.    PAST MEDICAL/SURGICAL HISTORY:  Past Medical History:  Diagnosis Date  . Anemia    Causing interruption in  anticoagulation  . Atrial fibrillation and flutter ([place])   . BPH (benign prostatic hypertrophy)   . Colon cancer ([place])    Status post LAR 2008  . Hypothyroidism   . Lacunar infarction ([place]) 11/2012   RT 3rd NERVE PALSY, SB [name]  . PE (pulmonary embolism)    Temporarily on Eliquis 2017   . Skin cancer    35 years ago  . Type 2 diabetes mellitus ([place])   . Vitamin B 12 deficiency 07/10/2019   Past Surgical History:  Procedure Laterality Date  . ABDOMINOPERINEAL PROCTOCOLECTOMY  2009   end colostomy   . BIOPSY  08/25/2018   Procedure: BIOPSY;  Surgeon: Rogene Houston, MD;  Location: [facility] ENDO SUITE;  Service: Endoscopy;;  . BLADDER REPAIR  08/29/2018   Procedure: BLADDER REPAIR;  Surgeon: [name], MD;  Location: [facility] ORS;  Service: General;;  . COLECTOMY  2008  . COLONOSCOPY WITH PROPOFOL N/A 08/25/2018   Procedure: COLONOSCOPY WITH PROPOFOL;  Surgeon: Rogene Houston, MD;  Location: [facility] ENDO SUITE;  Service: Endoscopy;  Laterality: N/A;  2:20  . COLOSTOMY    . EXPLORATORY LAPAROTOMY  2017   ? pneumoperitoneum of unknown etiology/ negative Ex lap  . ILEOSTOMY  08/29/2018   Procedure: ILEOSTOMY;  Surgeon: [name], MD;  Location: [facility] ORS;  Service: General;;  . LAPAROSCOPIC CHOLECYSTECTOMY  2007  . LYSIS OF ADHESION  08/29/2018   Procedure: LYSIS OF ADHESION;  Surgeon: [name], MD;  Location: [facility] ORS;  Service: General;;  . PARTIAL COLECTOMY N/A 08/29/2018   Procedure: PARTIAL COLECTOMY;  Surgeon: [name], MD;  Location: [facility] ORS;  Service: General;  Laterality: N/A;  . PORTACATH PLACEMENT Left 10/04/2018   Procedure: INSERTION PORT-A-CATH (attached catheter left subclavian);  Surgeon: [name], MD;  Location: [facility] ORS;  Service: General;  Laterality: Left;  . SBO SURGERY  06/2009     SOCIAL HISTORY:  Social History   Socioeconomic History  . Marital status: Married    Spouse name: Not on file  . Number of children: Not on file  . Years of education: Not on file  . Highest education level: Not on file  Occupational History  . Occupation: Architect    Comment: [employer]  Social Needs  . Financial resource strain: Not hard at all  . Food insecurity    Worry: Never true    Inability: Never true  . Transportation needs     Medical: No    Non-medical: No  Tobacco Use  . Smoking status: Former Smoker    Packs/day: 1.00    Years: 27.00    Pack years: 27.00    Types: Cigarettes    Start date: 01/27/1964    Quit date: 01/27/1991    Years since quitting: 28.4  . Smokeless tobacco: Former Systems developer    Types: [place] date: 08/10/1995  Substance and Sexual Activity  . Alcohol use: Never    Alcohol/week: 0.0 standard drinks    Frequency: Never  . Drug use: Never  . Sexual activity: Not Currently  Lifestyle  . Physical activity    Days per week: 0 days    Minutes per session: 0 min  . Stress: Not at all  Relationships  . Social Herbalist on phone: Twice a week    Gets together: Once a week    Attends religious service: Not on file    Active member of club or  organization: Not on file    Attends meetings of clubs or organizations: Not on file    Relationship status: Not on file  . Intimate partner violence    Fear of current or ex partner: No    Emotionally abused: No    Physically abused: No    Forced sexual activity: No  Other Topics Concern  . Not on file  Social History Narrative  . Not on file    FAMILY HISTORY:  Family History  Problem Relation Age of Onset  . Heart disease Father   . Heart failure Father   . Heart attack Father   . Prostate cancer Paternal Uncle     CURRENT MEDICATIONS:  Outpatient Encounter Medications as of 07/10/2019  Medication Sig  . ferrous sulfate (FERROUSUL) 325 (65 FE) MG tablet Take 1 tablet (325 mg total) by mouth 2 (two) times daily with a meal.  . glipiZIDE (GLUCOTROL) 10 MG tablet Take 10 mg by mouth 2 (two) times daily.  Marland Kitchen levothyroxine (SYNTHROID, LEVOTHROID) 50 MCG tablet Take 50 mcg by mouth daily before breakfast.  . lisinopril (ZESTRIL) 10 MG tablet Take 10 mg by mouth daily.  . metoprolol tartrate (LOPRESSOR) 25 MG tablet Take 1 tablet (25 mg total) by mouth 2 (two) times daily.  . tamsulosin (FLOMAX) 0.4 MG CAPS capsule Take 0.4 mg  by mouth at bedtime.   . [DISCONTINUED] aspirin 81 MG tablet Take 81 mg by mouth daily.  . cetirizine (ZYRTEC) 10 MG tablet Take 10 mg by mouth daily.  . ergocalciferol (VITAMIN D2) 1.25 MG (50000 UT) capsule Take 1 capsule (50,000 Units total) by mouth once a week.  . lidocaine-prilocaine (EMLA) cream Apply to affected area once (Patient not taking: Reported on 04/11/2019)  . loperamide (IMODIUM) 2 MG capsule Take 2 capsules (4 mg total) by mouth 4 (four) times daily -  before meals and at bedtime. (Patient not taking: Reported on 04/11/2019)   Facility-Administered Encounter Medications as of 07/10/2019  Medication  . iothalamate meglumine (CYSTO-CONRAY II) 17.2 % solution 250 mL    ALLERGIES:  No Known Allergies   PHYSICAL EXAM:  ECOG Performance status: 1  Vitals:   07/10/19 1425  BP: 140/65  Pulse: (!) 44  Resp: 18  Temp: (!) 96.9 F (36.1 C)  SpO2: 100%   Filed Weights   07/10/19 1425  Weight: 198 lb 9.6 oz (90.1 kg)    Physical Exam Constitutional:      Appearance: Normal appearance. He is normal weight.  Cardiovascular:     Rate and Rhythm: Normal rate and regular rhythm.     Heart sounds: Normal heart sounds.  Pulmonary:     Effort: Pulmonary effort is normal.     Breath sounds: Normal breath sounds.  Abdominal:     General: Bowel sounds are normal.     Palpations: Abdomen is soft.  Musculoskeletal: Normal range of motion.  Skin:    General: Skin is warm.  Neurological:     Mental Status: He is alert and oriented to person, place, and time. Mental status is at baseline.  Psychiatric:        Mood and Affect: Mood normal.        Behavior: Behavior normal.        Thought Content: Thought content normal.        Judgment: Judgment normal.      LABORATORY DATA:  I have reviewed the labs as listed.  CBC    Component Value Date/Time  WBC 4.2 06/21/2019 0956   RBC 3.99 (L) 06/21/2019 0956   HGB 13.6 06/21/2019 0956   HGB 11.8 (L) 09/14/2018 1514   HCT  41.2 06/21/2019 0956   HCT 38.4 09/14/2018 1514   PLT 103 (L) 06/21/2019 0956   PLT 468 (H) 09/14/2018 1514   MCV 103.3 (H) 06/21/2019 0956   MCV 87 09/14/2018 1514   MCH 34.1 (H) 06/21/2019 0956   MCHC 33.0 06/21/2019 0956   RDW 11.9 06/21/2019 0956   RDW 14.3 09/14/2018 1514   LYMPHSABS 1.1 06/21/2019 0956   LYMPHSABS 1.8 09/14/2018 1514   MONOABS 0.4 06/21/2019 0956   EOSABS 0.2 06/21/2019 0956   EOSABS 0.6 (H) 09/14/2018 1514   BASOSABS 0.0 06/21/2019 0956   BASOSABS 0.1 09/14/2018 1514   CMP Latest Ref Rng & Units 06/21/2019 04/06/2019 03/21/2019  Glucose 70 - 99 mg/dL 143(H) 134(H) 166(H)  BUN 8 - 23 mg/dL '16 18 13  '$ Creatinine 0.61 - 1.24 mg/dL 1.01 1.23 0.96  Sodium 135 - 145 mmol/L 138 137 135  Potassium 3.5 - 5.1 mmol/L 4.3 5.5(H) 4.5  Chloride 98 - 111 mmol/L 104 104 104  CO2 22 - 32 mmol/L '24 26 23  '$ Calcium 8.9 - 10.3 mg/dL 9.4 9.8 9.3  Total Protein 6.5 - 8.1 g/dL 7.1 7.5 6.7  Total Bilirubin 0.3 - 1.2 mg/dL 0.8 1.4(H) 0.9  Alkaline Phos 38 - 126 U/L 109 87 74  AST 15 - 41 U/L 32 37 36  ALT 0 - 44 U/L '25 29 27   '$ I personally performed a face-to-face visit.  All questions were answered to patient's stated satisfaction. Encouraged patient to call with any new concerns or questions before his next visit to the cancer center and we can certain see him sooner, if needed.     ASSESSMENT & PLAN:   Cancer of transverse colon ([place]) 1.  Stage IIb (T4N0) transverse colon adenocarcinoma, MSI stable ( -History of colon cancer in June 2008, status post resection, did not receive any adjuvant chemotherapy. -Recurrence in February 2009, underwent resection with colostomy, followed by 6 months of FOLFOX based chemotherapy sandwiching radiation. -Presentation with blood in the colostomy bag, colonoscopy on 08/25/2018 showing obstructing tumor in the mid transverse colon with active bleeding with other parts of the colon normal.  Biopsy consistent with adenocarcinoma. -Completion  colectomy and end ileostomy, lysis of adhesions and bladder repair on 08/29/2018 by Dr. Constance Haw. -Pathology showed pT4a, PN oh, 0/16 lymph nodes positive, MMR normal, margins negative. -PET scan on 09/28/2018 showed no active malignancy.  Mild activity associated with left upper quadrant ascites and anterior abdominal wound sites.  Likely inflammatory, liver unremarkable, CEA was 2.0. -12 cycles of adjuvant chemotherapy with infusional 5-FU and leucovorin from 10/09/2018-03/21/2019. -He has tolerated adjuvant chemotherapy well. -Labs from 04/06/2019 showed CEA level 2.1. -CT AP on 04/06/2019 showed interval complete colectomy and right lower quadrant ileostomy with no evidence of metastatic disease in the abdomen or pelvis.  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  Right lung base nodularity stable.  Presacral treatment related soft tissue thickening is also stable. -Labs on 06/21/2019 showed potassium 4.3, creatinine 1.01, LDH 142, WBC 4.2, hemoglobin 13.6, platelets 23. -Surveillance plan is blood work and CEA level every 3 months.  We will repeat CT abdomen pelvis every 6 months for the first 2 years followed by yearly scans.  We will switch his follow-up visits to every 6 months after 2 years. -He will return to the clinic in  3 months with labs and a CT scan.  2.  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 -He has used gabapentin in the past which helped.  3.  Diarrhea: -Baseline diarrhea is stable.  He is taking 4-8 Imodium tablets daily.  4.  Vitamin D deficiency: -Labs on 06/21/2019 showed vitamin D level 16.11 -We called him in a prescription for vitamin D 50,000 units weekly. -We will recheck his levels at his next visit  5.  Vitamin B12 deficiency: -Labs on 06/21/2019 showed his vitamin B12 level 175 -We will give him vitamin B12 injection today -He will start taking oral  B12 daily.      Orders placed this encounter:  Orders Placed This Encounter  Procedures  . CT ABDOMEN PELVIS W CONTRAST  . CEA  . Lactate dehydrogenase  . CBC with Differential/Platelet  . Comprehensive metabolic panel  . Vitamin B12  . Vitamin D 25 hydroxy      Francene Finders, FNP-C [facility] [phone]

## 2019-07-10 NOTE — Addendum Note (Signed)
Addended by: Anselmo Rod on: 07/10/2019 03:28 PM   Modules accepted: Orders

## 2019-07-10 NOTE — Patient Instructions (Addendum)
[place] at [facility] Discharge Instructions  Follow up in 3 months with labs and CT scan. Received Vit B12 injection today   Thank you for choosing [place] at [facility] to provide your oncology and hematology care.  To afford each patient quality time with our provider, please arrive at least 15 minutes before your scheduled appointment time.   If you have a lab appointment with the [facility] please come in thru the Main Entrance and check in at the main information desk.  You need to re-schedule your appointment should you arrive 10 or more minutes late.  We strive to give you quality time with our providers, and arriving late affects you and other patients whose appointments are after yours.  Also, if you no show three or more times for appointments you may be dismissed from the clinic at the providers discretion.     Again, thank you for choosing [facility].  Our hope is that these requests will decrease the amount of time that you wait before being seen by our physicians.       _____________________________________________________________  Should you have questions after your visit to [facility], please contact our office at [phone] between the hours of 8:00 a.m. and 4:30 p.m.  Voicemails left after 4:00 p.m. will not be returned until the following business day.  For prescription refill requests, have your pharmacy contact our office and allow 72 hours.    Due to Covid, you will need to wear a mask upon entering the hospital. If you do not have a mask, a mask will be given to you at the Main Entrance upon arrival. For doctor visits, patients may have 1 support person with them. For treatment visits, patients can not have anyone with them due to social distancing guidelines and our immunocompromised population.

## 2019-07-17 ENCOUNTER — Other Ambulatory Visit (HOSPITAL_COMMUNITY): Payer: Self-pay | Admitting: *Deleted

## 2019-07-17 DIAGNOSIS — D649 Anemia, unspecified: Secondary | ICD-10-CM

## 2019-07-17 MED ORDER — FERROUS SULFATE 325 (65 FE) MG PO TABS: 325.0000 mg | ORAL_TABLET | Freq: Two times a day (BID) | ORAL | 3 refills | Status: DC

## 2019-08-11 IMAGING — RF DG CYSTOGRAM 3+V
10 series · 10 of 10 positions shown · IV contrast (omnipaque)
Comparison: None

Correlation: CT abdomen and pelvis 08/21/2018

CLINICAL DATA: Bladder injury during colon surgery

EXAM:
CYSTOGRAM
TECHNIQUE: After catheterization of the urinary bladder following sterile
technique the bladder was filled with 150 mL Omnipaque 300 by drip
infusion. Serial spot images were obtained during bladder filling
and post draining.
FLUOROSCOPY TIME:  Fluoroscopy Time:  0 minutes 48 seconds
Radiation Exposure Index (if provided by the fluoroscopic device):
13.4 mGy
Number of Acquired Spot Images: multiple fluoroscopic screen
captures

[Series 1: cp_standard · 0.18mm/px · 1 of 1 slices shown (1 of 10)]
[im 1/1]
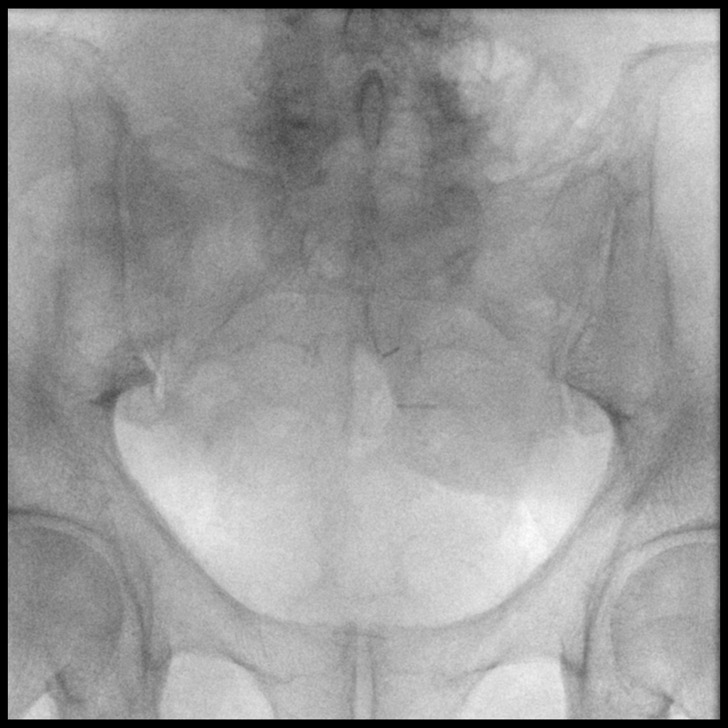

[Series 2: cp_standard · 0.18mm/px · 1 of 1 slices shown (2 of 10)]
[im 1/1]
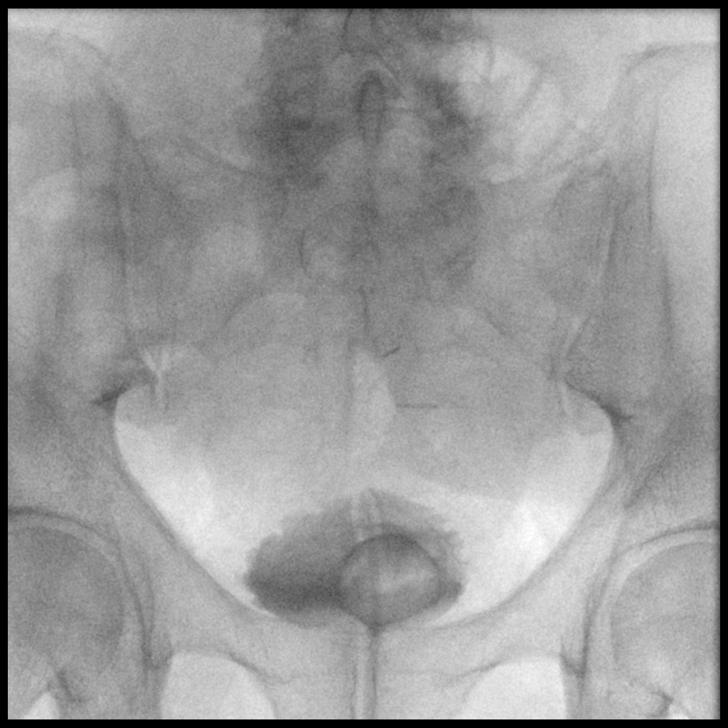

[Series 3: cp_standard · 0.17mm/px · 1 of 1 slices shown (3 of 10)]
[im 1/1]
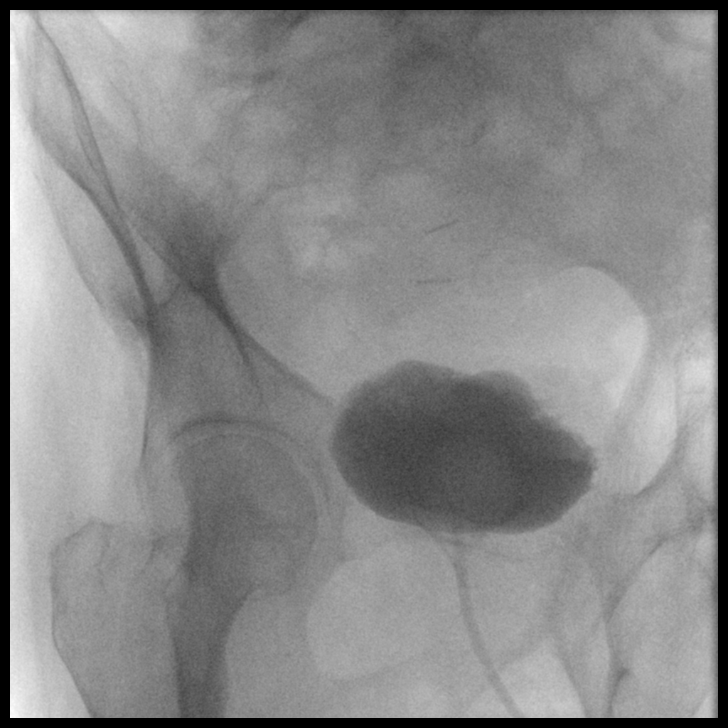

[Series 4: cp_standard · 0.18mm/px · 1 of 1 slices shown (4 of 10)]
[im 1/1]
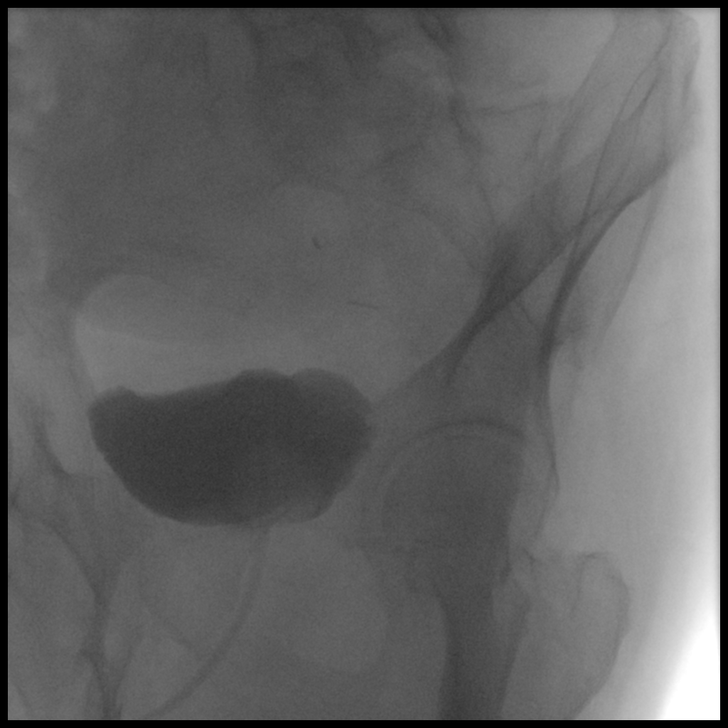

[Series 5: cp_standard · 0.18mm/px · 1 of 1 slices shown (5 of 10)]
[im 1/1]
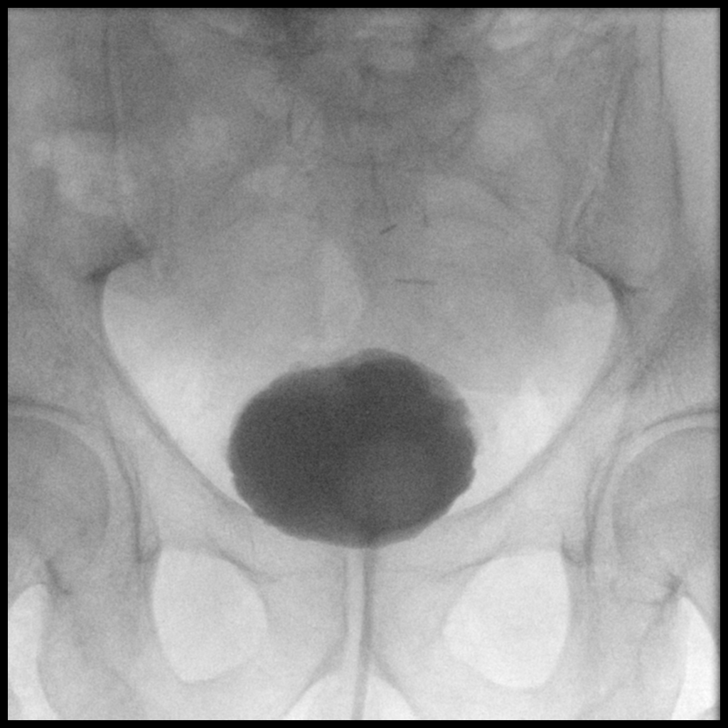

[Series 6: cp_standard · 0.18mm/px · 1 of 1 slices shown (6 of 10)]
[im 1/1]
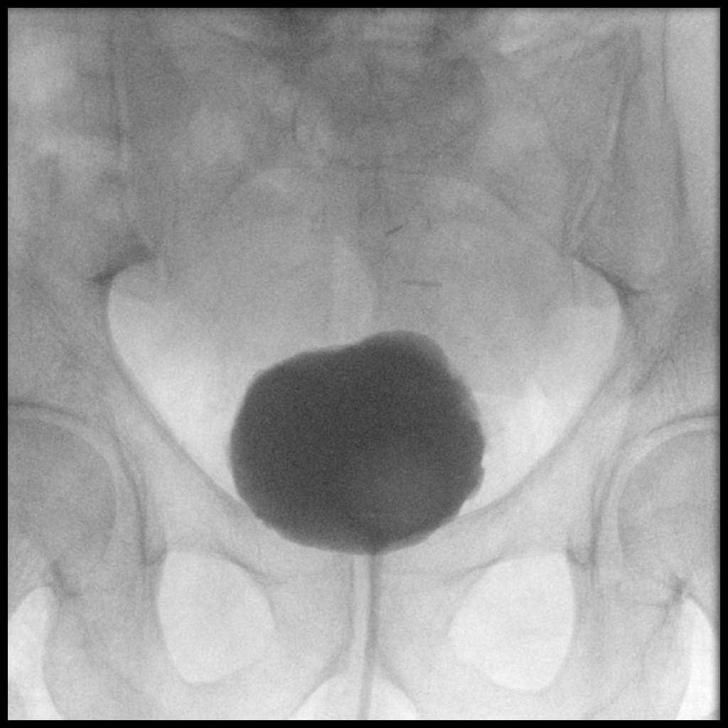

[Series 7: cp_standard · 0.17mm/px · 1 of 1 slices shown (7 of 10)]
[im 1/1]
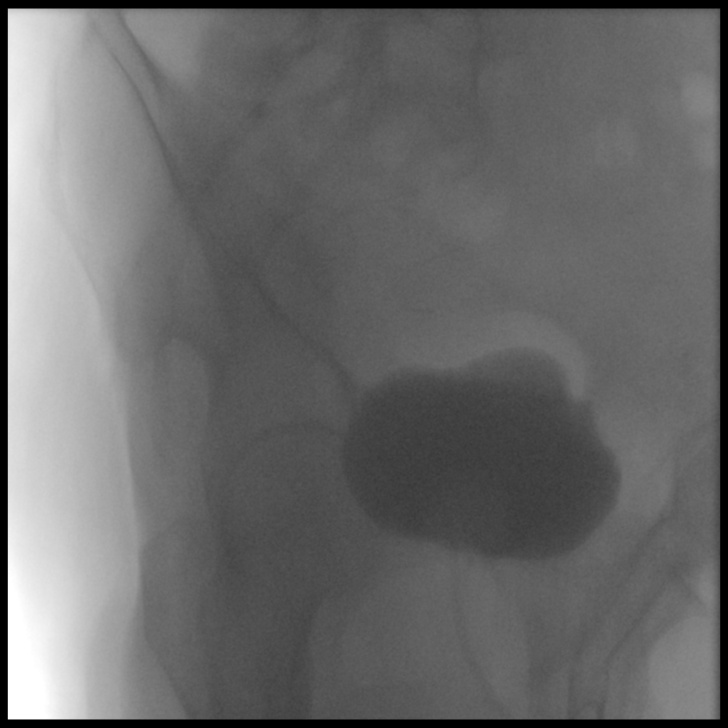

[Series 8: cp_standard · 0.18mm/px · 1 of 1 slices shown (8 of 10)]
[im 1/1]
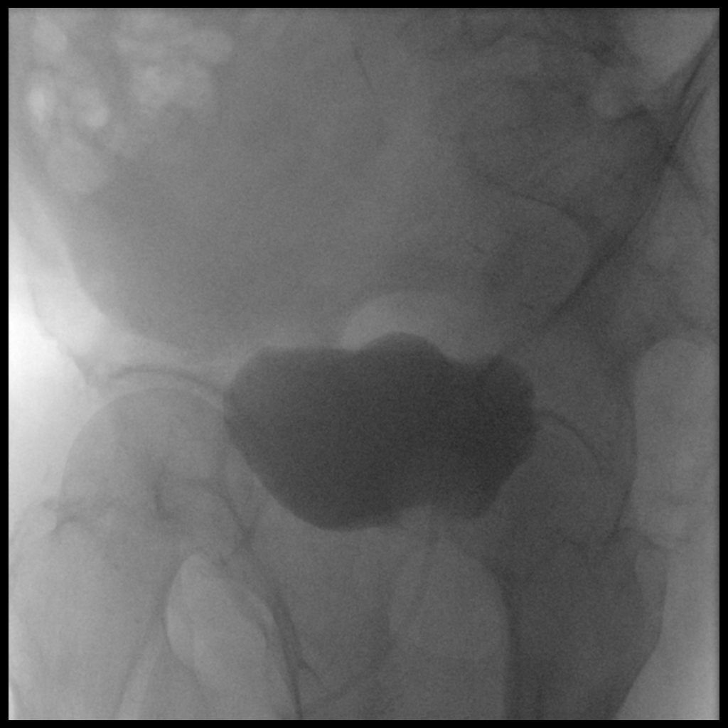

[Series 9: cp_standard · 0.27mm/px · 1 of 1 slices shown (9 of 10)]
[im 1/1]
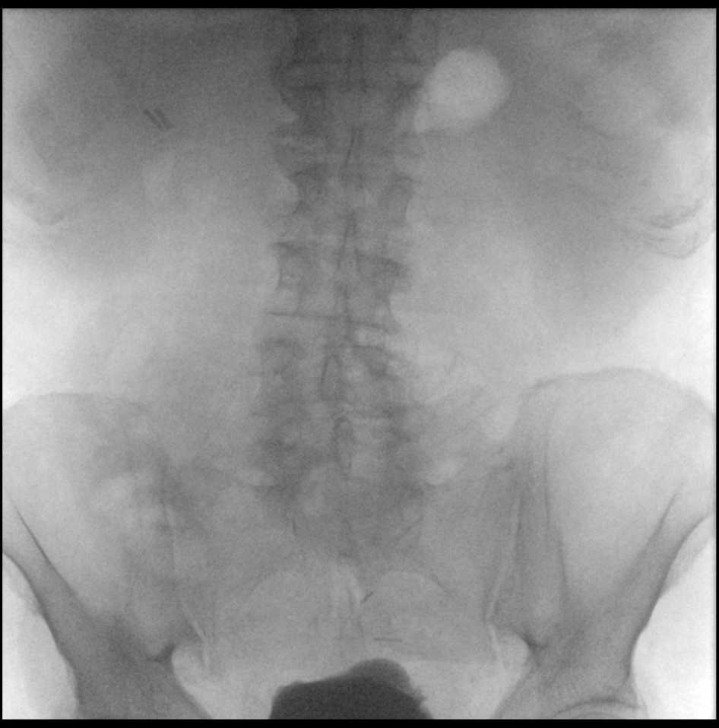

[Series 10: cp_standard · 0.17mm/px · 1 of 1 slices shown (10 of 10)]
[im 1/1]
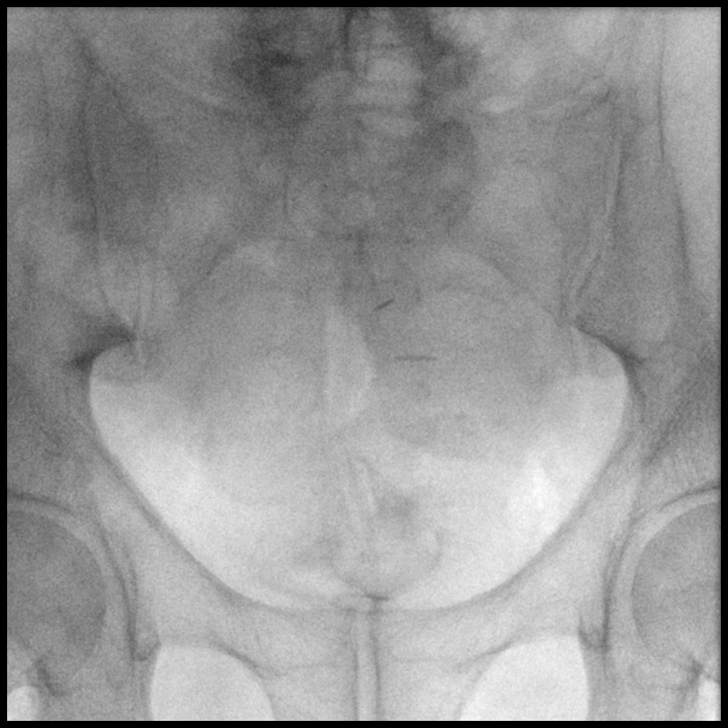

[10 of 10 positions shown; findings below may reference images not displayed]

FINDINGS: No calculi seen in bladder on precontrast images.

Bladder distends normally with smooth contours.  Lumbar puncture

Minimal bladder trabeculation.

No persistent intraluminal filling defects.

No vesicoureteral reflux or extravasation of contrast identified.

Post drainage images show no extravasated contrast in pelvis.

Bones appear demineralized.
IMPRESSION: No evidence of bladder leak or mass.

## 2019-08-27 DIAGNOSIS — Z23 Encounter for immunization: Secondary | ICD-10-CM | POA: Diagnosis not present

## 2019-08-31 DIAGNOSIS — E1165 Type 2 diabetes mellitus with hyperglycemia: Secondary | ICD-10-CM | POA: Diagnosis not present

## 2019-09-26 DIAGNOSIS — Z23 Encounter for immunization: Secondary | ICD-10-CM | POA: Diagnosis not present

## 2019-09-28 ENCOUNTER — Ambulatory Visit (HOSPITAL_COMMUNITY)
Admission: RE | Admit: 2019-09-28 | Discharge: 2019-09-28 | Disposition: A | Discharge status: Home / Self Care | Payer: Medicare Other | Source: Ambulatory Visit | Attending: Family Medicine | Admitting: Family Medicine

## 2019-09-28 ENCOUNTER — Other Ambulatory Visit (HOSPITAL_COMMUNITY): Payer: Self-pay | Admitting: Family Medicine

## 2019-09-28 ENCOUNTER — Other Ambulatory Visit: Payer: Self-pay

## 2019-09-28 DIAGNOSIS — R0789 Other chest pain: Secondary | ICD-10-CM

## 2019-09-28 DIAGNOSIS — I4891 Unspecified atrial fibrillation: Secondary | ICD-10-CM | POA: Diagnosis not present

## 2019-09-28 DIAGNOSIS — Z6827 Body mass index (BMI) 27.0-27.9, adult: Secondary | ICD-10-CM | POA: Diagnosis not present

## 2019-09-28 DIAGNOSIS — Z299 Encounter for prophylactic measures, unspecified: Secondary | ICD-10-CM | POA: Diagnosis not present

## 2019-09-28 DIAGNOSIS — Z87891 Personal history of nicotine dependence: Secondary | ICD-10-CM | POA: Diagnosis not present

## 2019-09-28 DIAGNOSIS — R0602 Shortness of breath: Secondary | ICD-10-CM | POA: Diagnosis not present

## 2019-09-28 DIAGNOSIS — I10 Essential (primary) hypertension: Secondary | ICD-10-CM | POA: Diagnosis not present

## 2019-10-04 DIAGNOSIS — E1165 Type 2 diabetes mellitus with hyperglycemia: Secondary | ICD-10-CM | POA: Diagnosis not present

## 2019-10-08 ENCOUNTER — Inpatient Hospital Stay (HOSPITAL_COMMUNITY): Payer: Medicare Other | Attending: Hematology

## 2019-10-08 ENCOUNTER — Other Ambulatory Visit: Payer: Self-pay

## 2019-10-08 ENCOUNTER — Ambulatory Visit (HOSPITAL_COMMUNITY)
Admission: RE | Admit: 2019-10-08 | Discharge: 2019-10-08 | Disposition: A | Discharge status: Home / Self Care | Payer: Medicare Other | Source: Ambulatory Visit | Attending: Nurse Practitioner | Admitting: Nurse Practitioner

## 2019-10-08 DIAGNOSIS — Z86711 Personal history of pulmonary embolism: Secondary | ICD-10-CM | POA: Insufficient documentation

## 2019-10-08 DIAGNOSIS — E1142 Type 2 diabetes mellitus with diabetic polyneuropathy: Secondary | ICD-10-CM | POA: Insufficient documentation

## 2019-10-08 DIAGNOSIS — I4891 Unspecified atrial fibrillation: Secondary | ICD-10-CM | POA: Insufficient documentation

## 2019-10-08 DIAGNOSIS — E559 Vitamin D deficiency, unspecified: Secondary | ICD-10-CM | POA: Insufficient documentation

## 2019-10-08 DIAGNOSIS — Z8249 Family history of ischemic heart disease and other diseases of the circulatory system: Secondary | ICD-10-CM | POA: Insufficient documentation

## 2019-10-08 DIAGNOSIS — K529 Noninfective gastroenteritis and colitis, unspecified: Secondary | ICD-10-CM | POA: Insufficient documentation

## 2019-10-08 DIAGNOSIS — N400 Enlarged prostate without lower urinary tract symptoms: Secondary | ICD-10-CM | POA: Insufficient documentation

## 2019-10-08 DIAGNOSIS — C189 Malignant neoplasm of colon, unspecified: Secondary | ICD-10-CM | POA: Diagnosis not present

## 2019-10-08 DIAGNOSIS — Z79899 Other long term (current) drug therapy: Secondary | ICD-10-CM | POA: Insufficient documentation

## 2019-10-08 DIAGNOSIS — C184 Malignant neoplasm of transverse colon: Secondary | ICD-10-CM | POA: Insufficient documentation

## 2019-10-08 DIAGNOSIS — Z8042 Family history of malignant neoplasm of prostate: Secondary | ICD-10-CM | POA: Insufficient documentation

## 2019-10-08 DIAGNOSIS — Z933 Colostomy status: Secondary | ICD-10-CM | POA: Diagnosis not present

## 2019-10-08 DIAGNOSIS — Z87891 Personal history of nicotine dependence: Secondary | ICD-10-CM | POA: Diagnosis not present

## 2019-10-08 DIAGNOSIS — Z85828 Personal history of other malignant neoplasm of skin: Secondary | ICD-10-CM | POA: Diagnosis not present

## 2019-10-08 DIAGNOSIS — Z8673 Personal history of transient ischemic attack (TIA), and cerebral infarction without residual deficits: Secondary | ICD-10-CM | POA: Insufficient documentation

## 2019-10-08 DIAGNOSIS — E039 Hypothyroidism, unspecified: Secondary | ICD-10-CM | POA: Insufficient documentation

## 2019-10-08 DIAGNOSIS — R531 Weakness: Secondary | ICD-10-CM | POA: Insufficient documentation

## 2019-10-08 LAB — COMPREHENSIVE METABOLIC PANEL
ALT: 29 U/L (ref 0–44)
AST: 35 U/L (ref 15–41)
Albumin: 3.7 g/dL (ref 3.5–5.0)
Alkaline Phosphatase: 113 U/L (ref 38–126)
Anion gap: 8 (ref 5–15)
BUN: 14 mg/dL (ref 8–23)
CO2: 26 mmol/L (ref 22–32)
Calcium: 9.7 mg/dL (ref 8.9–10.3)
Chloride: 99 mmol/L (ref 98–111)
Creatinine, Ser: 1.06 mg/dL (ref 0.61–1.24)
GFR calc Af Amer: >60 mL/min (ref >60)
GFR calc non Af Amer: >60 mL/min (ref >60)
Glucose, Bld: 227 mg/dL — ABNORMAL HIGH (ref 70–99)
Potassium: 5 mmol/L (ref 3.5–5.1)
Sodium: 133 mmol/L — ABNORMAL LOW (ref 135–145)
Total Bilirubin: 0.9 mg/dL (ref 0.3–1.2)
Total Protein: 8.1 g/dL (ref 6.5–8.1)

## 2019-10-08 LAB — CBC WITH DIFFERENTIAL/PLATELET
Abs Immature Granulocytes: 0.05 10*3/uL (ref 0.00–0.07)
Basophils Absolute: 0.1 10*3/uL (ref 0.0–0.1)
Basophils Relative: 1 %
Eosinophils Absolute: 0.2 10*3/uL (ref 0.0–0.5)
Eosinophils Relative: 3 %
HCT: 44 % (ref 39.0–52.0)
Hemoglobin: 14.6 g/dL (ref 13.0–17.0)
Immature Granulocytes: 1 %
Lymphocytes Relative: 21 %
Lymphs Abs: 1.7 10*3/uL (ref 0.7–4.0)
MCH: 33 pg (ref 26.0–34.0)
MCHC: 33.2 g/dL (ref 30.0–36.0)
MCV: 99.3 fL (ref 80.0–100.0)
Monocytes Absolute: 0.7 10*3/uL (ref 0.1–1.0)
Monocytes Relative: 9 %
Neutro Abs: 5.2 10*3/uL (ref 1.7–7.7)
Neutrophils Relative %: 65 %
Platelets: 150 10*3/uL (ref 150–400)
RBC: 4.43 MIL/uL (ref 4.22–5.81)
RDW: 13.5 % (ref 11.5–15.5)
WBC: 8 10*3/uL (ref 4.0–10.5)
nRBC: 0 % (ref 0.0–0.2)

## 2019-10-08 LAB — LACTATE DEHYDROGENASE: LDH: 144 U/L (ref 98–192)

## 2019-10-08 LAB — VITAMIN B12: Vitamin B-12: 213 pg/mL (ref 180–914)

## 2019-10-08 LAB — VITAMIN D 25 HYDROXY (VIT D DEFICIENCY, FRACTURES): Vit D, 25-Hydroxy: 24.04 ng/mL — ABNORMAL LOW (ref 30–100)

## 2019-10-08 MED ORDER — IOHEXOL 300 MG/ML  SOLN
100.0000 mL | Freq: Once | INTRAMUSCULAR | Status: AC | PRN
  Administered 2019-10-08: 100 mL via INTRAVENOUS

## 2019-10-09 LAB — CEA: CEA: 3.1 ng/mL (ref 0.0–4.7)

## 2019-10-11 ENCOUNTER — Inpatient Hospital Stay (HOSPITAL_BASED_OUTPATIENT_CLINIC_OR_DEPARTMENT_OTHER): Payer: Medicare Other | Admitting: Hematology

## 2019-10-11 ENCOUNTER — Other Ambulatory Visit: Payer: Self-pay

## 2019-10-11 VITALS — BP 111/67 | HR 84 | Temp 97.1°F | Resp 18 | Wt 195.0 lb

## 2019-10-11 DIAGNOSIS — E1142 Type 2 diabetes mellitus with diabetic polyneuropathy: Secondary | ICD-10-CM | POA: Diagnosis not present

## 2019-10-11 DIAGNOSIS — E559 Vitamin D deficiency, unspecified: Secondary | ICD-10-CM | POA: Diagnosis not present

## 2019-10-11 DIAGNOSIS — C184 Malignant neoplasm of transverse colon: Secondary | ICD-10-CM | POA: Diagnosis not present

## 2019-10-11 DIAGNOSIS — R531 Weakness: Secondary | ICD-10-CM | POA: Diagnosis not present

## 2019-10-11 DIAGNOSIS — K529 Noninfective gastroenteritis and colitis, unspecified: Secondary | ICD-10-CM | POA: Diagnosis not present

## 2019-10-11 DIAGNOSIS — I4891 Unspecified atrial fibrillation: Secondary | ICD-10-CM | POA: Diagnosis not present

## 2019-10-11 MED ORDER — HEPARIN SOD (PORK) LOCK FLUSH 100 UNIT/ML IV SOLN
500.0000 [IU] | Freq: Once | INTRAVENOUS | Status: AC
  Administered 2019-10-11: 15:00:00 500 [IU] via INTRAVENOUS

## 2019-10-11 MED ORDER — SODIUM CHLORIDE 0.9% FLUSH
10.0000 mL | INTRAVENOUS | Status: DC | PRN
  Administered 2019-10-11: 10 mL via INTRAVENOUS

## 2019-10-11 NOTE — Patient Instructions (Signed)
[place] at [facility] Discharge Instructions  You were seen today by Dr. Delton Coombes. He went over your recent lab and scan results. Continue taking current medications as directed. Start taking B12 daily. He will see you back in 3 months for labs and follow up.   Thank you for choosing [place] at [facility] to provide your oncology and hematology care.  To afford each patient quality time with our provider, please arrive at least 15 minutes before your scheduled appointment time.   If you have a lab appointment with the [facility] please come in thru the  Main Entrance and check in at the main information desk  You need to re-schedule your appointment should you arrive 10 or more minutes late.  We strive to give you quality time with our providers, and arriving late affects you and other patients whose appointments are after yours.  Also, if you no show three or more times for appointments you may be dismissed from the clinic at the providers discretion.     Again, thank you for choosing [facility].  Our hope is that these requests will decrease the amount of time that you wait before being seen by our physicians.       _____________________________________________________________  Should you have questions after your visit to [facility], please contact our office at [phone] between the hours of 8:00 a.m. and 4:30 p.m.  Voicemails left after 4:00 p.m. will not be returned until the following business day.  For prescription refill requests, have your pharmacy contact our office and allow 72 hours.    Cancer Center Support Programs:   > Cancer Support Group  2nd Tuesday of the month 1pm-2pm, Journey Room

## 2019-10-11 NOTE — Progress Notes (Signed)
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[place])   09/29/2018 Cancer Staging   Staging form: Colon and Rectum, AJCC 8th Edition - Clinical stage from 09/29/2018: Stage IIB (cT4a, cN0, cM0) - Signed by Derek Jack, MD on 09/29/2018   10/09/2018 - 04/03/2019 Chemotherapy   The patient had ondansetron (ZOFRAN) 8 mg in sodium chloride 0.9 % 50 mL IVPB, 8 mg (100 % of original dose 8 mg), Intravenous,  Once, 12 of 12 cycles Dose modification: 8 mg (original dose 8 mg, Cycle 1) Administration: 8 mg (10/09/2018), 8 mg (10/23/2018), 8 mg (11/06/2018), 8 mg (11/20/2018), 8 mg (12/04/2018), 8 mg (12/18/2018), 8 mg (01/02/2019), 8 mg (01/15/2019), 8 mg (02/06/2019), 8 mg (02/21/2019), 8 mg (03/07/2019), 8 mg (03/21/2019) leucovorin 800 mg in dextrose 5 % 250 mL infusion, 820 mg, Intravenous,  Once, 12 of 12 cycles Administration: 800 mg (10/09/2018), 820 mg (10/23/2018), 820 mg (11/06/2018), 820 mg (11/20/2018), 820 mg (12/04/2018), 820 mg (12/18/2018), 820 mg (01/02/2019), 820 mg (01/15/2019), 820 mg (02/06/2019), 820 mg (02/21/2019), 820 mg (03/07/2019), 820 mg (03/21/2019) fluorouracil (ADRUCIL) chemo injection 800 mg, 400 mg/m2 = 800 mg, Intravenous,  Once, 12 of 12 cycles Administration: 800 mg (10/09/2018), 800 mg (10/23/2018), 800 mg (11/06/2018), 800 mg (11/20/2018), 800 mg (12/04/2018), 800 mg (12/18/2018), 800 mg (01/02/2019), 800 mg (01/15/2019), 800 mg (02/06/2019), 800 mg (02/21/2019), 800 mg (03/07/2019), 800 mg (03/21/2019) fluorouracil (ADRUCIL) 4,900 mg in sodium chloride 0.9 % 52 mL chemo infusion, 2,400 mg/m2 = 4,900 mg, Intravenous, 1 Day/Dose, 12 of 12 cycles Administration: 4,900 mg (10/09/2018),  4,900 mg (10/23/2018), 5,000 mg (11/06/2018), 5,000 mg (11/20/2018), 5,000 mg (12/04/2018), 5,000 mg (12/18/2018), 5,000 mg (01/02/2019), 5,000 mg (01/15/2019), 5,000 mg (02/06/2019), 5,000 mg (02/21/2019), 5,000 mg (03/07/2019), 5,000 mg (03/21/2019)  for chemotherapy treatment.       CANCER STAGING: Cancer Staging Cancer of transverse colon [facility]) Staging form: Colon and Rectum, AJCC 8th Edition - Clinical stage from 09/29/2018: Stage IIB (cT4a, cN0, cM0) - Signed by Derek Jack, MD on 09/29/2018    INTERVAL HISTORY:  Jose Lawrence 79 y.o. male seen for follow-up of colon cancer.  He reports good appetite 800%.  Energy levels are 50%.  Denies any bleeding into the colostomy.  Reports taking Imodium 2 tablets twice daily for the chronic diarrhea since all his surgeries.  Numbness in the extremities has been stable.  He is not requiring any gabapentin.  He is taking vitamin B12 supplements and vitamin D once weekly.     REVIEW OF SYSTEMS:  Review of Systems  Neurological: Positive for numbness.  All other systems reviewed and are negative.    PAST MEDICAL/SURGICAL HISTORY:  Past Medical History:  Diagnosis Date  . Anemia    Causing interruption in anticoagulation  . Atrial fibrillation and flutter ([place])   . BPH (benign prostatic hypertrophy)   . Colon cancer ([place])    Status post LAR 2008  . Hypothyroidism   . Lacunar infarction ([place]) 11/2012   RT 3rd NERVE PALSY, SB [name]  . PE (pulmonary embolism)    Temporarily on Eliquis 2017  . Skin cancer    35 years ago  .  Type 2 diabetes mellitus ([place])   . Vitamin B 12 deficiency 07/10/2019   Past Surgical History:  Procedure Laterality Date  . ABDOMINOPERINEAL PROCTOCOLECTOMY  2009   end colostomy   . BIOPSY  08/25/2018   Procedure: BIOPSY;  Surgeon: Rogene Houston, MD;  Location: [facility] SU[facility];  Service: Endoscopy;;  . BLADDER REPAIR  08/29/2018   Procedure: BLADDER REPAIR;  Surgeon: [name], MD;  Location: [facility]  ORS;  Service: General;;  . COLECTOMY  2008  . COLONOSCOPY WITH PROPOFOL N/A 08/25/2018   Procedure: COLONOSCOPY WITH PROPOFOL;  Surgeon: Rogene Houston, MD;  Location: [facility] END[facility];  Service: Endoscopy;  Laterality: N/A;  2:20  . COLOSTOMY    . EXPLORATORY LAPAROTOMY  2017   ? pneumoperitoneum of unknown etiology/ negative Ex lap  . ILEOSTOMY  08/29/2018   Procedure: ILEOSTOMY;  Surgeon: [name], MD;  Location: [facility] ORS;  Service: General;;  . LAPAROSCOPIC CHOLECYSTECTOMY  2007  . LYSIS OF ADHESION  08/29/2018   Procedure: LYSIS OF ADHESION;  Surgeon: [name], MD;  Location: [facility] ORS;  Service: General;;  . PARTIAL COLECTOMY N/A 08/29/2018   Procedure: PARTIAL COLECTOMY;  Surgeon: [name], MD;  Location: [facility] ORS;  Service: General;  Laterality: N/A;  . PORTACATH PLACEMENT Left 10/04/2018   Procedure: INSERTION PORT-A-CATH (attached catheter left subclavian);  Surgeon: [name], MD;  Location: [facility] ORS;  Service: General;  Laterality: Left;  . SBO SURGERY  06/2009     SOCIAL HISTORY:  Social History   Socioeconomic History  . Marital status: Married    Spouse name: Not on file  . Number of children: Not on file  . Years of education: Not on file  . Highest education level: Not on file  Occupational History  . Occupation: Architect    Comment: [employer]  Tobacco Use  . Smoking status: Former Smoker    Packs/day: 1.00    Years: 27.00    Pack years: 27.00    Types: Cigarettes    Start date: 01/27/1964    Quit date: 01/27/1991    Years since quitting: 28.7  . Smokeless tobacco: Former Systems developer    Types: [place] date: 08/10/1995  Substance and Sexual Activity  . Alcohol use: Never    Alcohol/week: 0.0 standard drinks  . Drug use: Never  . Sexual activity: Not Currently  Other Topics Concern  . Not on file  Social History Narrative  . Not on file   Social Determinants of Health   Financial Resource Strain:   . Difficulty of Paying  Living Expenses: Not on file  Food Insecurity:   . Worried About Charity fundraiser in the Last Year: Not on file  . Ran Out of Food in the Last Year: Not on file  Transportation Needs:   . Lack of Transportation (Medical): Not on file  . Lack of Transportation (Non-Medical): Not on file  Physical Activity:   . Days of Exercise per Week: Not on file  . Minutes of Exercise per Session: Not on file  Stress:   . Feeling of Stress : Not on file  Social Connections:   . Frequency of Communication with Friends and Family: Not on file  . Frequency of Social Gatherings with Friends and Family: Not on file  . Attends Religious Services: Not on file  . Active Member of Clubs or Organizations: Not on file  . Attends Archivist Meetings: Not  on file  . Marital Status: Not on file  Intimate Partner Violence:   . Fear of Current or Ex-Partner: Not on file  . Emotionally Abused: Not on file  . Physically Abused: Not on file  . Sexually Abused: Not on file    FAMILY HISTORY:  Family History  Problem Relation Age of Onset  . Heart disease Father   . Heart failure Father   . Heart attack Father   . Prostate cancer Paternal Uncle     CURRENT MEDICATIONS:  Outpatient Encounter Medications as of 10/11/2019  Medication Sig  . cetirizine (ZYRTEC) 10 MG tablet Take 10 mg by mouth daily.  . ergocalciferol (VITAMIN D2) 1.25 MG (50000 UT) capsule Take 1 capsule (50,000 Units total) by mouth once a week.  . ferrous sulfate (FERROUSUL) 325 (65 FE) MG tablet Take 1 tablet (325 mg total) by mouth 2 (two) times daily with a meal.  . glipiZIDE (GLUCOTROL) 10 MG tablet Take 10 mg by mouth 2 (two) times daily.  Marland Kitchen levothyroxine (SYNTHROID, LEVOTHROID) 50 MCG tablet Take 50 mcg by mouth daily before breakfast.  . lidocaine-prilocaine (EMLA) cream Apply to affected area once  . lisinopril (ZESTRIL) 10 MG tablet Take 10 mg by mouth daily.  Marland Kitchen loperamide (IMODIUM) 2 MG capsule Take 2 capsules (4 mg  total) by mouth 4 (four) times daily -  before meals and at bedtime.  . metoprolol tartrate (LOPRESSOR) 25 MG tablet Take 1 tablet (25 mg total) by mouth 2 (two) times daily.  . tamsulosin (FLOMAX) 0.4 MG CAPS capsule Take 0.4 mg by mouth at bedtime.    Facility-Administered Encounter Medications as of 10/11/2019  Medication  . [COMPLETED] heparin lock flush 100 unit/mL  . iothalamate meglumine (CYSTO-CONRAY II) 17.2 % solution 250 mL  . sodium chloride flush (NS) 0.9 % injection 10 mL    ALLERGIES:  No Known Allergies   PHYSICAL EXAM:  ECOG Performance status: 2  Vitals:   10/11/19 1400  BP: 111/67  Pulse: 84  Resp: 18  Temp: (!) 97.1 F (36.2 C)  SpO2: 99%   Filed Weights   10/11/19 1400  Weight: 195 lb (88.5 kg)    Physical Exam Constitutional:      Appearance: Normal appearance. He is normal weight.  Cardiovascular:     Rate and Rhythm: Normal rate and regular rhythm.     Heart sounds: Normal heart sounds.  Pulmonary:     Effort: Pulmonary effort is normal.     Breath sounds: Normal breath sounds.  Abdominal:     General: Bowel sounds are normal.     Palpations: Abdomen is soft.  Musculoskeletal:        General: Normal range of motion.  Skin:    General: Skin is warm and dry.  Neurological:     Mental Status: He is alert and oriented to person, place, and time. Mental status is at baseline.  Psychiatric:        Mood and Affect: Mood normal.        Behavior: Behavior normal.        Thought Content: Thought content normal.        Judgment: Judgment normal.      LABORATORY DATA:  I have reviewed the labs as listed.  CBC    Component Value Date/Time   WBC 8.0 10/08/2019 1057   RBC 4.43 10/08/2019 1057   HGB 14.6 10/08/2019 1057   HGB 11.8 (L) 09/14/2018 1514   HCT 44.0 10/08/2019 1057  HCT 38.4 09/14/2018 1514   PLT 150 10/08/2019 1057   PLT 468 (H) 09/14/2018 1514   MCV 99.3 10/08/2019 1057   MCV 87 09/14/2018 1514   MCH 33.0 10/08/2019 1057     MCHC 33.2 10/08/2019 1057   RDW 13.5 10/08/2019 1057   RDW 14.3 09/14/2018 1514   LYMPHSABS 1.7 10/08/2019 1057   LYMPHSABS 1.8 09/14/2018 1514   MONOABS 0.7 10/08/2019 1057   EOSABS 0.2 10/08/2019 1057   EOSABS 0.6 (H) 09/14/2018 1514   BASOSABS 0.1 10/08/2019 1057   BASOSABS 0.1 09/14/2018 1514   CMP Latest Ref Rng & Units 10/08/2019 06/21/2019 04/06/2019  Glucose 70 - 99 mg/dL 227(H) 143(H) 134(H)  BUN 8 - 23 mg/dL _0 Creatinine 0.61 - 1.24 mg/dL 1.06 1.01 1.23  Sodium 135 - 145 mmol/L 133(L) 138 137  Potassium 3.5 - 5.1 mmol/L 5.0 4.3 5.5(H)  Chloride 98 - 111 mmol/L 99 104 104  CO2 22 - 32 mmol/L _1 Calcium 8.9 - 10.3 mg/dL 9.7 9.4 9.8  Total Protein 6.5 - 8.1 g/dL 8.1 7.1 7.5  Total Bilirubin 0.3 - 1.2 mg/dL 0.9 0.8 1.4(H)  Alkaline Phos 38 - 126 U/L 113 109 87  AST 15 - 41 U/L 35 32 37  ALT 0 - 44 U/L _2 DIAGNOSTIC IMAGING:  I have independently reviewed scans.     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) 1.  Stage IIb (T4N0) transverse colon adenocarcinoma, MSI stable: -History of colon cancer in 2008, status post resection, did not receive any adjuvant chemo.  Recurrence in February 2009, underwent resection with colostomy, followed by 6 months of FOLFOX with radiation. -12 cycles of adjuvant chemotherapy with infusional 5-FU and leucovorin from 10/09/2018-03/21/2019. -I reviewed CTAP from 10/08/2019.  No evidence of recurrence. -I reviewed labs.  CEA is 3.1.  Hemoglobin is 14.6 with normal white count and platelet count. -We will plan to reevaluate him in 3 months with a repeat CEA and other labs.  2.  Deficiency: -We reviewed B12 level which was 213.  He will continue B12 1 mg daily.  3.  Chronic diarrhea: -This is from previous surgeries.  He reports diarrhea and to the colostomy. -He is taking Imodium 2 tablets in the morning and 2 tablets in the evening.  4.  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  He has used gabapentin in the past.  He no longer takes it.  5.  Vitamin D deficiency: -We reviewed vitamin D level from 10/08/2019.  This was 24.  He will continue vitamin D weekly.      Orders placed this encounter:  Orders Placed This Encounter  Procedures  . CBC with Differential/Platelet  . Comprehensive metabolic panel  . CEA  . Vitamin B12  . Vitamin D 25 hydroxy  . Schedule Portacath Flush Appointment      Derek Jack, MD [place] [phone]

## 2019-10-13 NOTE — Assessment & Plan Note (Signed)
1.  Stage IIb (T4N0) transverse colon adenocarcinoma, MSI stable: -History of colon cancer in 2008, status post resection, did not receive any adjuvant chemo.  Recurrence in February 2009, underwent resection with colostomy, followed by 6 months of FOLFOX with radiation. -12 cycles of adjuvant chemotherapy with infusional 5-FU and leucovorin from 10/09/2018-03/21/2019. -I reviewed CTAP from 10/08/2019.  No evidence of recurrence. -I reviewed labs.  CEA is 3.1.  Hemoglobin is 14.6 with normal white count and platelet count. -We will plan to reevaluate him in 3 months with a repeat CEA and other labs.  2.  Deficiency: -We reviewed B12 level which was 213.  He will continue B12 1 mg daily.  3.  Chronic diarrhea: -This is from previous surgeries.  He reports diarrhea and to the colostomy. -He is taking Imodium 2 tablets in the morning and 2 tablets in the evening.  4.  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  He has used gabapentin in the past.  He no longer takes it.  5.  Vitamin D deficiency: -We reviewed vitamin D level from 10/08/2019.  This was 24.  He will continue vitamin D weekly.

## 2019-10-15 ENCOUNTER — Other Ambulatory Visit: Payer: Self-pay

## 2019-10-15 ENCOUNTER — Encounter (HOSPITAL_COMMUNITY): Payer: Self-pay

## 2019-10-15 ENCOUNTER — Ambulatory Visit (HOSPITAL_COMMUNITY)
Admission: RE | Admit: 2019-10-15 | Discharge: 2019-10-15 | Disposition: A | Discharge status: Home / Self Care | Payer: Medicare Other | Source: Ambulatory Visit | Attending: Family Medicine | Admitting: Family Medicine

## 2019-10-15 ENCOUNTER — Other Ambulatory Visit (HOSPITAL_COMMUNITY): Payer: Self-pay | Admitting: Family Medicine

## 2019-10-15 DIAGNOSIS — I10 Essential (primary) hypertension: Secondary | ICD-10-CM | POA: Diagnosis not present

## 2019-10-15 DIAGNOSIS — J189 Pneumonia, unspecified organism: Secondary | ICD-10-CM

## 2019-10-15 DIAGNOSIS — I2699 Other pulmonary embolism without acute cor pulmonale: Secondary | ICD-10-CM | POA: Diagnosis not present

## 2019-10-15 DIAGNOSIS — E1165 Type 2 diabetes mellitus with hyperglycemia: Secondary | ICD-10-CM | POA: Diagnosis not present

## 2019-10-15 DIAGNOSIS — Z299 Encounter for prophylactic measures, unspecified: Secondary | ICD-10-CM | POA: Diagnosis not present

## 2019-10-15 DIAGNOSIS — J181 Lobar pneumonia, unspecified organism: Secondary | ICD-10-CM | POA: Diagnosis not present

## 2019-11-06 DIAGNOSIS — E1165 Type 2 diabetes mellitus with hyperglycemia: Secondary | ICD-10-CM | POA: Diagnosis not present

## 2019-11-15 DIAGNOSIS — Z932 Ileostomy status: Secondary | ICD-10-CM | POA: Diagnosis not present

## 2019-11-15 DIAGNOSIS — M5431 Sciatica, right side: Secondary | ICD-10-CM | POA: Diagnosis not present

## 2019-11-15 DIAGNOSIS — E1165 Type 2 diabetes mellitus with hyperglycemia: Secondary | ICD-10-CM | POA: Diagnosis not present

## 2019-11-15 DIAGNOSIS — Z299 Encounter for prophylactic measures, unspecified: Secondary | ICD-10-CM | POA: Diagnosis not present

## 2019-11-15 DIAGNOSIS — I10 Essential (primary) hypertension: Secondary | ICD-10-CM | POA: Diagnosis not present

## 2019-12-07 DIAGNOSIS — E1165 Type 2 diabetes mellitus with hyperglycemia: Secondary | ICD-10-CM | POA: Diagnosis not present

## 2020-01-16 ENCOUNTER — Inpatient Hospital Stay (HOSPITAL_COMMUNITY): Payer: Medicare Other | Attending: Hematology

## 2020-01-16 ENCOUNTER — Other Ambulatory Visit: Payer: Self-pay

## 2020-01-16 DIAGNOSIS — Z79899 Other long term (current) drug therapy: Secondary | ICD-10-CM | POA: Diagnosis not present

## 2020-01-16 DIAGNOSIS — Z7984 Long term (current) use of oral hypoglycemic drugs: Secondary | ICD-10-CM | POA: Insufficient documentation

## 2020-01-16 DIAGNOSIS — C184 Malignant neoplasm of transverse colon: Secondary | ICD-10-CM

## 2020-01-16 DIAGNOSIS — I4891 Unspecified atrial fibrillation: Secondary | ICD-10-CM | POA: Insufficient documentation

## 2020-01-16 DIAGNOSIS — Z9221 Personal history of antineoplastic chemotherapy: Secondary | ICD-10-CM | POA: Diagnosis not present

## 2020-01-16 DIAGNOSIS — Z87891 Personal history of nicotine dependence: Secondary | ICD-10-CM | POA: Insufficient documentation

## 2020-01-16 DIAGNOSIS — E538 Deficiency of other specified B group vitamins: Secondary | ICD-10-CM | POA: Diagnosis not present

## 2020-01-16 DIAGNOSIS — K529 Noninfective gastroenteritis and colitis, unspecified: Secondary | ICD-10-CM | POA: Diagnosis not present

## 2020-01-16 DIAGNOSIS — Z86711 Personal history of pulmonary embolism: Secondary | ICD-10-CM | POA: Insufficient documentation

## 2020-01-16 DIAGNOSIS — Z8673 Personal history of transient ischemic attack (TIA), and cerebral infarction without residual deficits: Secondary | ICD-10-CM | POA: Diagnosis not present

## 2020-01-16 DIAGNOSIS — Z85828 Personal history of other malignant neoplasm of skin: Secondary | ICD-10-CM | POA: Diagnosis not present

## 2020-01-16 DIAGNOSIS — Z85038 Personal history of other malignant neoplasm of large intestine: Secondary | ICD-10-CM | POA: Diagnosis not present

## 2020-01-16 DIAGNOSIS — E039 Hypothyroidism, unspecified: Secondary | ICD-10-CM | POA: Insufficient documentation

## 2020-01-16 DIAGNOSIS — E559 Vitamin D deficiency, unspecified: Secondary | ICD-10-CM | POA: Diagnosis not present

## 2020-01-16 DIAGNOSIS — N400 Enlarged prostate without lower urinary tract symptoms: Secondary | ICD-10-CM | POA: Diagnosis not present

## 2020-01-16 DIAGNOSIS — E1142 Type 2 diabetes mellitus with diabetic polyneuropathy: Secondary | ICD-10-CM | POA: Diagnosis not present

## 2020-01-16 DIAGNOSIS — G629 Polyneuropathy, unspecified: Secondary | ICD-10-CM | POA: Insufficient documentation

## 2020-01-16 DIAGNOSIS — Z8042 Family history of malignant neoplasm of prostate: Secondary | ICD-10-CM | POA: Insufficient documentation

## 2020-01-16 LAB — VITAMIN B12: Vitamin B-12: 1465 pg/mL — ABNORMAL HIGH (ref 180–914)

## 2020-01-16 LAB — CBC WITH DIFFERENTIAL/PLATELET
Abs Immature Granulocytes: 0.02 10*3/uL (ref 0.00–0.07)
Basophils Absolute: 0.1 10*3/uL (ref 0.0–0.1)
Basophils Relative: 1 %
Eosinophils Absolute: 0.2 10*3/uL (ref 0.0–0.5)
Eosinophils Relative: 3 %
HCT: 39.9 % (ref 39.0–52.0)
Hemoglobin: 13.5 g/dL (ref 13.0–17.0)
Immature Granulocytes: 0 %
Lymphocytes Relative: 21 %
Lymphs Abs: 1.3 10*3/uL (ref 0.7–4.0)
MCH: 33 pg (ref 26.0–34.0)
MCHC: 33.8 g/dL (ref 30.0–36.0)
MCV: 97.6 fL (ref 80.0–100.0)
Monocytes Absolute: 0.5 10*3/uL (ref 0.1–1.0)
Monocytes Relative: 9 %
Neutro Abs: 4 10*3/uL (ref 1.7–7.7)
Neutrophils Relative %: 66 %
Platelets: 133 10*3/uL — ABNORMAL LOW (ref 150–400)
RBC: 4.09 MIL/uL — ABNORMAL LOW (ref 4.22–5.81)
RDW: 13.1 % (ref 11.5–15.5)
WBC: 6.1 10*3/uL (ref 4.0–10.5)
nRBC: 0 % (ref 0.0–0.2)

## 2020-01-16 LAB — COMPREHENSIVE METABOLIC PANEL
ALT: 20 U/L (ref 0–44)
AST: 29 U/L (ref 15–41)
Albumin: 3.7 g/dL (ref 3.5–5.0)
Alkaline Phosphatase: 101 U/L (ref 38–126)
Anion gap: 8 (ref 5–15)
BUN: 17 mg/dL (ref 8–23)
CO2: 24 mmol/L (ref 22–32)
Calcium: 9.4 mg/dL (ref 8.9–10.3)
Chloride: 100 mmol/L (ref 98–111)
Creatinine, Ser: 1.24 mg/dL (ref 0.61–1.24)
GFR calc Af Amer: >60 mL/min (ref >60)
GFR calc non Af Amer: 56 mL/min — ABNORMAL LOW (ref >60)
Glucose, Bld: 241 mg/dL — ABNORMAL HIGH (ref 70–99)
Potassium: 4.9 mmol/L (ref 3.5–5.1)
Sodium: 132 mmol/L — ABNORMAL LOW (ref 135–145)
Total Bilirubin: 0.9 mg/dL (ref 0.3–1.2)
Total Protein: 7.2 g/dL (ref 6.5–8.1)

## 2020-01-16 LAB — VITAMIN D 25 HYDROXY (VIT D DEFICIENCY, FRACTURES): Vit D, 25-Hydroxy: 27.37 ng/mL — ABNORMAL LOW (ref 30–100)

## 2020-01-17 LAB — CEA: CEA: 2.1 ng/mL (ref 0.0–4.7)

## 2020-01-23 ENCOUNTER — Inpatient Hospital Stay (HOSPITAL_COMMUNITY): Payer: Medicare Other

## 2020-01-23 ENCOUNTER — Inpatient Hospital Stay (HOSPITAL_BASED_OUTPATIENT_CLINIC_OR_DEPARTMENT_OTHER): Payer: Medicare Other | Admitting: Hematology

## 2020-01-23 ENCOUNTER — Other Ambulatory Visit: Payer: Self-pay

## 2020-01-23 VITALS — BP 125/66 | HR 42 | Temp 96.6°F | Resp 17 | Wt 195.3 lb

## 2020-01-23 DIAGNOSIS — E559 Vitamin D deficiency, unspecified: Secondary | ICD-10-CM | POA: Diagnosis not present

## 2020-01-23 DIAGNOSIS — C184 Malignant neoplasm of transverse colon: Secondary | ICD-10-CM

## 2020-01-23 DIAGNOSIS — G629 Polyneuropathy, unspecified: Secondary | ICD-10-CM | POA: Diagnosis not present

## 2020-01-23 DIAGNOSIS — I4891 Unspecified atrial fibrillation: Secondary | ICD-10-CM | POA: Diagnosis not present

## 2020-01-23 DIAGNOSIS — K529 Noninfective gastroenteritis and colitis, unspecified: Secondary | ICD-10-CM | POA: Diagnosis not present

## 2020-01-23 DIAGNOSIS — Z85038 Personal history of other malignant neoplasm of large intestine: Secondary | ICD-10-CM | POA: Diagnosis not present

## 2020-01-23 DIAGNOSIS — E538 Deficiency of other specified B group vitamins: Secondary | ICD-10-CM | POA: Diagnosis not present

## 2020-01-23 MED ORDER — SODIUM CHLORIDE 0.9% FLUSH
10.0000 mL | INTRAVENOUS | Status: DC | PRN
  Administered 2020-01-23: 10 mL via INTRAVENOUS

## 2020-01-23 MED ORDER — HEPARIN SOD (PORK) LOCK FLUSH 100 UNIT/ML IV SOLN
500.0000 [IU] | Freq: Once | INTRAVENOUS | Status: AC
  Administered 2020-01-23: 500 [IU] via INTRAVENOUS

## 2020-01-23 NOTE — Progress Notes (Signed)
Jose Lawrence, Jose Lawrence [id number]   CLINIC:  Medical Oncology/Hematology  PCP:  Jose Chroman, MD [address] / [place] [address] [phone]   REASON FOR VISIT:  Follow-up for colon cancer  PRIOR THERAPY:  1. Colon resection with colostomy in 09/2007. 2. FOLFOX and radiation from 09/2007 through 02/2008 3. 12 cycles of infusional 5-FU and leucovorin from 10/09/2018 through 03/21/2019.  NGS Results: Foundation 1 with MSI--stable  CURRENT THERAPY: Observation  BRIEF ONCOLOGIC HISTORY:  Oncology History  Cancer of transverse colon (Jose Lawrence)  09/23/2018 Initial Diagnosis   Cancer of transverse colon ([place])   09/29/2018 Cancer Staging   Staging form: Colon and Rectum, AJCC 8th Edition - Clinical stage from 09/29/2018: Stage IIB (cT4a, cN0, cM0) - Signed by Jose Jack, MD on 09/29/2018   10/09/2018 - 04/03/2019 Chemotherapy   The patient had ondansetron (ZOFRAN) 8 mg in sodium chloride 0.9 % 50 mL IVPB, 8 mg (100 % of original dose 8 mg), Intravenous,  Once, 12 of 12 cycles Dose modification: 8 mg (original dose 8 mg, Cycle 1) Administration: 8 mg (10/09/2018), 8 mg (10/23/2018), 8 mg (11/06/2018), 8 mg (11/20/2018), 8 mg (12/04/2018), 8 mg (12/18/2018), 8 mg (01/02/2019), 8 mg (01/15/2019), 8 mg (02/06/2019), 8 mg (02/21/2019), 8 mg (03/07/2019), 8 mg (03/21/2019) leucovorin 800 mg in dextrose 5 % 250 mL infusion, 820 mg, Intravenous,  Once, 12 of 12 cycles Administration: 800 mg (10/09/2018), 820 mg (10/23/2018), 820 mg (11/06/2018), 820 mg (11/20/2018), 820 mg (12/04/2018), 820 mg (12/18/2018), 820 mg (01/02/2019), 820 mg (01/15/2019), 820 mg (02/06/2019), 820 mg (02/21/2019), 820 mg (03/07/2019), 820 mg (03/21/2019) fluorouracil (ADRUCIL) chemo injection 800 mg, 400 mg/m2 = 800 mg, Intravenous,  Once, 12 of 12 cycles Administration: 800 mg (10/09/2018), 800 mg (10/23/2018), 800 mg (11/06/2018), 800 mg (11/20/2018), 800 mg (12/04/2018), 800 mg (12/18/2018), 800 mg (01/02/2019),  800 mg (01/15/2019), 800 mg (02/06/2019), 800 mg (02/21/2019), 800 mg (03/07/2019), 800 mg (03/21/2019) fluorouracil (ADRUCIL) 4,900 mg in sodium chloride 0.9 % 52 mL chemo infusion, 2,400 mg/m2 = 4,900 mg, Intravenous, 1 Day/Dose, 12 of 12 cycles Administration: 4,900 mg (10/09/2018), 4,900 mg (10/23/2018), 5,000 mg (11/06/2018), 5,000 mg (11/20/2018), 5,000 mg (12/04/2018), 5,000 mg (12/18/2018), 5,000 mg (01/02/2019), 5,000 mg (01/15/2019), 5,000 mg (02/06/2019), 5,000 mg (02/21/2019), 5,000 mg (03/07/2019), 5,000 mg (03/21/2019)  for chemotherapy treatment.      CANCER STAGING: Cancer Staging Cancer of transverse colon [facility]) Staging form: Colon and Rectum, AJCC 8th Edition - Clinical stage from 09/29/2018: Stage IIB (cT4a, cN0, cM0) - Signed by Jose Jack, MD on 09/29/2018   INTERVAL HISTORY:  Jose Lawrence, a 79 y.o. male, returns for routine follow-up of his colon cancer. Jose Lawrence was last seen on 10/11/2019.   Today he is accompanied by his wife. He denies hematochezia and melena in the bag. He denies having any new pains. His weight is stable. He takes vitamin D occasionally. He reports feeling depressed which he ascribes to metoprolol.   REVIEW OF SYSTEMS:  Review of Systems  Constitutional: Positive for appetite change (mildly decreased) and fatigue (mild).  Psychiatric/Behavioral: Positive for depression.  All other systems reviewed and are negative.   PAST MEDICAL/SURGICAL HISTORY:  Past Medical History:  Diagnosis Date  . Anemia    Causing interruption in anticoagulation  . Atrial fibrillation and flutter ([place])   . BPH (benign prostatic hypertrophy)   . Colon cancer ([place])    Status post LAR 2008  . Hypothyroidism   .  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  . PE (pulmonary embolism)    Temporarily on Eliquis 2017  . Skin cancer    35 years ago  . Type 2 diabetes mellitus ([place])   . Vitamin B 12 deficiency 07/10/2019   Past Surgical  History:  Procedure Laterality Date  . ABDOMINOPERINEAL PROCTOCOLECTOMY  2009   end colostomy   . BIOPSY  08/25/2018   Procedure: BIOPSY;  Surgeon: Rogene Houston, MD;  Location: [facility] ENDO SU[facility];  Service: Endoscopy;;  . BLADDER REPAIR  08/29/2018   Procedure: BLADDER REPAIR;  Surgeon: [name], MD;  Location: [facility] ORS;  Service: General;;  . COLECTOMY  2008  . COLONOSCOPY WITH PROPOFOL N/A 08/25/2018   Procedure: COLONOSCOPY WITH PROPOFOL;  Surgeon: Rogene Houston, MD;  Location: [facility] ENDO SUITE;  Service: Endoscopy;  Laterality: N/A;  2:20  . COLOSTOMY    . EXPLORATORY LAPAROTOMY  2017   ? pneumoperitoneum of unknown etiology/ negative Ex lap  . ILEOSTOMY  08/29/2018   Procedure: ILEOSTOMY;  Surgeon: [name], MD;  Location: [facility] ORS;  Service: General;;  . LAPAROSCOPIC CHOLECYSTECTOMY  2007  . LYSIS OF ADHESION  08/29/2018   Procedure: LYSIS OF ADHESION;  Surgeon: [name], MD;  Location: [facility] ORS;  Service: General;;  . PARTIAL COLECTOMY N/A 08/29/2018   Procedure: PARTIAL COLECTOMY;  Surgeon: [name], MD;  Location: [facility] ORS;  Service: General;  Laterality: N/A;  . PORTACATH PLACEMENT Left 10/04/2018   Procedure: INSERTION PORT-A-CATH (attached catheter left subclavian);  Surgeon: [name], MD;  Location: [facility] ORS;  Service: General;  Laterality: Left;  . SBO SURGERY  06/2009    SOCIAL HISTORY:  Social History   Socioeconomic History  . Marital status: Married    Spouse name: Not on file  . Number of children: Not on file  . Years of education: Not on file  . Highest education level: Not on file  Occupational History  . Occupation: Architect    Comment: [employer]  Tobacco Use  . Smoking status: Former Smoker    Packs/day: 1.00    Years: 27.00    Pack years: 27.00    Types: Cigarettes    Start date: 01/27/1964    Quit date: 01/27/1991    Years since quitting: 29.0  . Smokeless tobacco: Former Systems developer    Types: [place] date:  08/10/1995  Vaping Use  . Vaping Use: Never used  Substance and Sexual Activity  . Alcohol use: Never    Alcohol/week: 0.0 standard drinks  . Drug use: Never  . Sexual activity: Not Currently  Other Topics Concern  . Not on file  Social History Narrative  . Not on file   Social Determinants of Health   Financial Resource Strain:   . Difficulty of Paying Living Expenses:   Food Insecurity:   . Worried About Charity fundraiser in the Last Year:   . Arboriculturist in the Last Year:   Transportation Needs:   . Film/video editor (Medical):   Marland Kitchen Lack of Transportation (Non-Medical):   Physical Activity:   . Days of Exercise per Week:   . Minutes of Exercise per Session:   Stress:   . Feeling of Stress :   Social Connections:   . Frequency of Communication with Friends and Family:   . Frequency of Social Gatherings with Friends and Family:   .  Attends Religious Services:   . Active Member of Clubs or Organizations:   . Attends Archivist Meetings:   Marland Kitchen Marital Status:   Intimate Partner Violence:   . Fear of Current or Ex-Partner:   . Emotionally Abused:   Marland Kitchen Physically Abused:   . Sexually Abused:     FAMILY HISTORY:  Family History  Problem Relation Age of Onset  . Heart disease Father   . Heart failure Father   . Heart attack Father   . Prostate cancer Paternal Uncle     CURRENT MEDICATIONS:  Current Outpatient Medications  Medication Sig Dispense Refill  . ergocalciferol (VITAMIN D2) 1.25 MG (50000 UT) capsule Take 1 capsule (50,000 Units total) by mouth once a week. 16 capsule 4  . ferrous sulfate (FERROUSUL) 325 (65 FE) MG tablet Take 1 tablet (325 mg total) by mouth 2 (two) times daily with a meal. 60 tablet 3  . glipiZIDE (GLUCOTROL) 10 MG tablet Take 10 mg by mouth 2 (two) times daily.    Marland Kitchen levothyroxine (SYNTHROID, LEVOTHROID) 50 MCG tablet Take 50 mcg by mouth daily before breakfast.    . lidocaine-prilocaine (EMLA) cream Apply to affected  area once 30 g 3  . lisinopril (ZESTRIL) 10 MG tablet Take 10 mg by mouth daily.    Marland Kitchen loperamide (IMODIUM) 2 MG capsule Take 2 capsules (4 mg total) by mouth 4 (four) times daily -  before meals and at bedtime. 240 capsule 3  . metoprolol tartrate (LOPRESSOR) 25 MG tablet Take 1 tablet (25 mg total) by mouth 2 (two) times daily. 60 tablet 0  . tamsulosin (FLOMAX) 0.4 MG CAPS capsule Take 0.4 mg by mouth at bedtime.      No current facility-administered medications for this visit.   Facility-Administered Medications Ordered in Other Visits  Medication Dose Route Frequency Provider Last Rate Last Admin  . iothalamate meglumine (CYSTO-CONRAY II) 17.2 % solution 250 mL  250 mL Intravesical Once PRN Jose Gustin, MD      . sodium chloride flush (NS) 0.9 % injection 10 mL  10 mL Intravenous PRN Jose Jack, MD   10 mL at 01/23/20 1432    ALLERGIES:  No Known Allergies  PHYSICAL EXAM:  Performance status (ECOG): 2 - Symptomatic, <50% confined to bed  Vitals:   01/23/20 1429  BP: 125/66  Pulse: (!) 42  Resp: 17  Temp: (!) 96.6 F (35.9 C)  SpO2: 100%   Wt Readings from Last 3 Encounters:  01/23/20 195 lb 4.8 oz (88.6 kg)  10/11/19 195 lb (88.5 kg)  07/10/19 198 lb 9.6 oz (90.1 kg)   Physical Exam Constitutional:      Appearance: Normal appearance.  Cardiovascular:     Rate and Rhythm: Normal rate and regular rhythm.     Pulses: Normal pulses.     Heart sounds: Normal heart sounds.  Pulmonary:     Effort: Pulmonary effort is normal.     Breath sounds: Normal breath sounds.  Abdominal:     Palpations: Abdomen is soft. There is no mass.     Tenderness: There is no abdominal tenderness.     Comments: Colostomy on R side  Musculoskeletal:     Right lower leg: No edema.     Left lower leg: No edema.  Neurological:     General: No focal deficit present.     Mental Status: He is alert and oriented to person, place, and time.  Psychiatric:  Mood and Affect:  Mood normal.        Behavior: Behavior normal.      LABORATORY DATA:  I have reviewed the labs as listed.  CBC Latest Ref Rng & Units 01/16/2020 10/08/2019 06/21/2019  WBC 4.0 - 10.5 K/uL 6.1 8.0 4.2  Hemoglobin 13.0 - 17.0 g/dL 13.5 14.6 13.6  Hematocrit 39 - 52 % 39.9 44.0 41.2  Platelets 150 - 400 K/uL 133(L) 150 103(L)   CMP Latest Ref Rng & Units 01/16/2020 10/08/2019 06/21/2019  Glucose 70 - 99 mg/dL 241(H) 227(H) 143(H)  BUN 8 - 23 mg/dL _0 Creatinine 0.61 - 1.24 mg/dL 1.24 1.06 1.01  Sodium 135 - 145 mmol/L 132(L) 133(L) 138  Potassium 3.5 - 5.1 mmol/L 4.9 5.0 4.3  Chloride 98 - 111 mmol/L 100 99 104  CO2 22 - 32 mmol/L _1 Calcium 8.9 - 10.3 mg/dL 9.4 9.7 9.4  Total Protein 6.5 - 8.1 g/dL 7.2 8.1 7.1  Total Bilirubin 0.3 - 1.2 mg/dL 0.9 0.9 0.8  Alkaline Phos 38 - 126 U/L 101 113 109  AST 15 - 41 U/L 29 35 32  ALT 0 - 44 U/L _2 Lab Results  Component Value Date   CEA1 2.1 01/16/2020   CEA1 3.1 10/08/2019   CEA1 2.1 06/21/2019    DIAGNOSTIC IMAGING:  I have independently reviewed the scans and discussed with the patient.   ASSESSMENT:  1.  Stage IIb (T4N0) transverse colon adenocarcinoma, MSI stable: -History of colon cancer in 2008, status post resection, did not receive any adjuvant chemo. -Recurrence in February 2009, underwent resection with colostomy, followed by 6 months of FOLFOX with radiation. -12 cycles of adjuvant chemotherapy with infusional 5-FU/leucovorin from 10/09/2018-03/21/2019. -CTAP on 10/08/2019 did not show any evidence of recurrence.   PLAN:  1.  Stage IIb (T4N0) transverse colon adenocarcinoma, MSI stable: -I have reviewed labs from 01/16/2020.  LFTs are normal.  CEA was 2.1. -I plan to see him back in 3 months with repeat labs and CT scan.  2.  B12 Deficiency: -Vitamin B12 was normal.  Continue vitamin B12.  3.  Chronic diarrhea: -Continue Imodium as needed.  This is from previous surgeries.  4.  Peripheral  neuropathy: -Has some residual neuropathy in the feet and fingers.  This was from prior oxaliplatin.  He is no longer requiring gabapentin.  5.  Vitamin D deficiency: -Vitamin D was 27.  I have told him to take 1000 units of vitamin D consistently.  He is taking on and off.    Orders placed this encounter:  No orders of the defined types were placed in this encounter.    Jose Jack, MD [place] [phone]   I, Milinda Antis, am acting as a scribe for Dr. Sanda Linger.  I, Jose Jack MD, have reviewed the above documentation for accuracy and completeness, and I agree with the above.

## 2020-01-23 NOTE — Patient Instructions (Signed)
[place] at [facility] Discharge Instructions  You were seen today by Dr. Delton Coombes. He went over your recent results. Please take 1,000 units of vitamin D daily. You will be scheduled for a CT scan of your abdomen. Dr. Delton Coombes will see you back in 3 months for labs and follow up.   Thank you for choosing [place] at [facility] to provide your oncology and hematology care.  To afford each patient quality time with our provider, please arrive at least 15 minutes before your scheduled appointment time.   If you have a lab appointment with the [facility] please come in thru the Main Entrance and check in at the main information desk  You need to re-schedule your appointment should you arrive 10 or more minutes late.  We strive to give you quality time with our providers, and arriving late affects you and other patients whose appointments are after yours.  Also, if you no show three or more times for appointments you may be dismissed from the clinic at the providers discretion.     Again, thank you for choosing [facility].  Our hope is that these requests will decrease the amount of time that you wait before being seen by our physicians.       _____________________________________________________________  Should you have questions after your visit to [facility], please contact our office at [phone] between the hours of 8:00 a.m. and 4:30 p.m.  Voicemails left after 4:00 p.m. will not be returned until the following business day.  For prescription refill requests, have your pharmacy contact our office and allow 72 hours.    Cancer Center Support Programs:   > Cancer Support Group  2nd Tuesday of the month 1pm-2pm, Journey Room

## 2020-02-06 DIAGNOSIS — E1165 Type 2 diabetes mellitus with hyperglycemia: Secondary | ICD-10-CM | POA: Diagnosis not present

## 2020-02-06 DIAGNOSIS — I10 Essential (primary) hypertension: Secondary | ICD-10-CM | POA: Diagnosis not present

## 2020-02-20 DIAGNOSIS — E1165 Type 2 diabetes mellitus with hyperglycemia: Secondary | ICD-10-CM | POA: Diagnosis not present

## 2020-02-20 DIAGNOSIS — I4891 Unspecified atrial fibrillation: Secondary | ICD-10-CM | POA: Diagnosis not present

## 2020-02-20 DIAGNOSIS — L819 Disorder of pigmentation, unspecified: Secondary | ICD-10-CM | POA: Diagnosis not present

## 2020-02-20 DIAGNOSIS — E119 Type 2 diabetes mellitus without complications: Secondary | ICD-10-CM | POA: Diagnosis not present

## 2020-02-20 DIAGNOSIS — C44222 Squamous cell carcinoma of skin of right ear and external auricular canal: Secondary | ICD-10-CM | POA: Diagnosis not present

## 2020-02-20 DIAGNOSIS — I2699 Other pulmonary embolism without acute cor pulmonale: Secondary | ICD-10-CM | POA: Diagnosis not present

## 2020-02-20 DIAGNOSIS — C44212 Basal cell carcinoma of skin of right ear and external auricular canal: Secondary | ICD-10-CM | POA: Diagnosis not present

## 2020-02-20 DIAGNOSIS — D485 Neoplasm of uncertain behavior of skin: Secondary | ICD-10-CM | POA: Diagnosis not present

## 2020-02-20 DIAGNOSIS — Z299 Encounter for prophylactic measures, unspecified: Secondary | ICD-10-CM | POA: Diagnosis not present

## 2020-03-17 DIAGNOSIS — Z789 Other specified health status: Secondary | ICD-10-CM | POA: Diagnosis not present

## 2020-03-17 DIAGNOSIS — K9400 Colostomy complication, unspecified: Secondary | ICD-10-CM | POA: Diagnosis not present

## 2020-03-17 DIAGNOSIS — I10 Essential (primary) hypertension: Secondary | ICD-10-CM | POA: Diagnosis not present

## 2020-03-17 DIAGNOSIS — I4891 Unspecified atrial fibrillation: Secondary | ICD-10-CM | POA: Diagnosis not present

## 2020-03-17 DIAGNOSIS — Z299 Encounter for prophylactic measures, unspecified: Secondary | ICD-10-CM | POA: Diagnosis not present

## 2020-03-19 DIAGNOSIS — C44222 Squamous cell carcinoma of skin of right ear and external auricular canal: Secondary | ICD-10-CM | POA: Diagnosis not present

## 2020-04-01 DIAGNOSIS — C44222 Squamous cell carcinoma of skin of right ear and external auricular canal: Secondary | ICD-10-CM | POA: Diagnosis not present

## 2020-04-08 DIAGNOSIS — I10 Essential (primary) hypertension: Secondary | ICD-10-CM | POA: Diagnosis not present

## 2020-04-08 DIAGNOSIS — E1165 Type 2 diabetes mellitus with hyperglycemia: Secondary | ICD-10-CM | POA: Diagnosis not present

## 2020-04-15 ENCOUNTER — Other Ambulatory Visit (HOSPITAL_COMMUNITY): Payer: Self-pay | Admitting: *Deleted

## 2020-04-15 DIAGNOSIS — C184 Malignant neoplasm of transverse colon: Secondary | ICD-10-CM

## 2020-04-21 ENCOUNTER — Inpatient Hospital Stay (HOSPITAL_COMMUNITY): Payer: Medicare Other | Attending: Hematology

## 2020-04-21 ENCOUNTER — Encounter (HOSPITAL_COMMUNITY): Payer: Self-pay

## 2020-04-21 ENCOUNTER — Ambulatory Visit (HOSPITAL_COMMUNITY)
Admission: RE | Admit: 2020-04-21 | Discharge: 2020-04-21 | Disposition: A | Discharge status: Home / Self Care | Payer: Medicare Other | Source: Ambulatory Visit | Attending: Hematology | Admitting: Hematology

## 2020-04-21 ENCOUNTER — Other Ambulatory Visit: Payer: Self-pay

## 2020-04-21 DIAGNOSIS — Z933 Colostomy status: Secondary | ICD-10-CM | POA: Insufficient documentation

## 2020-04-21 DIAGNOSIS — Z79899 Other long term (current) drug therapy: Secondary | ICD-10-CM | POA: Insufficient documentation

## 2020-04-21 DIAGNOSIS — E538 Deficiency of other specified B group vitamins: Secondary | ICD-10-CM | POA: Insufficient documentation

## 2020-04-21 DIAGNOSIS — C184 Malignant neoplasm of transverse colon: Secondary | ICD-10-CM | POA: Insufficient documentation

## 2020-04-21 DIAGNOSIS — E559 Vitamin D deficiency, unspecified: Secondary | ICD-10-CM | POA: Insufficient documentation

## 2020-04-21 DIAGNOSIS — Z85038 Personal history of other malignant neoplasm of large intestine: Secondary | ICD-10-CM | POA: Insufficient documentation

## 2020-04-21 DIAGNOSIS — D696 Thrombocytopenia, unspecified: Secondary | ICD-10-CM | POA: Diagnosis not present

## 2020-04-21 DIAGNOSIS — K432 Incisional hernia without obstruction or gangrene: Secondary | ICD-10-CM | POA: Diagnosis not present

## 2020-04-21 DIAGNOSIS — C189 Malignant neoplasm of colon, unspecified: Secondary | ICD-10-CM | POA: Diagnosis not present

## 2020-04-21 DIAGNOSIS — E1142 Type 2 diabetes mellitus with diabetic polyneuropathy: Secondary | ICD-10-CM | POA: Diagnosis not present

## 2020-04-21 DIAGNOSIS — K439 Ventral hernia without obstruction or gangrene: Secondary | ICD-10-CM | POA: Diagnosis not present

## 2020-04-21 LAB — CBC WITH DIFFERENTIAL/PLATELET
Abs Immature Granulocytes: 0.02 10*3/uL (ref 0.00–0.07)
Basophils Absolute: 0 10*3/uL (ref 0.0–0.1)
Basophils Relative: 1 %
Eosinophils Absolute: 0.1 10*3/uL (ref 0.0–0.5)
Eosinophils Relative: 3 %
HCT: 36.9 % — ABNORMAL LOW (ref 39.0–52.0)
Hemoglobin: 12.6 g/dL — ABNORMAL LOW (ref 13.0–17.0)
Immature Granulocytes: 1 %
Lymphocytes Relative: 29 %
Lymphs Abs: 1 10*3/uL (ref 0.7–4.0)
MCH: 34.4 pg — ABNORMAL HIGH (ref 26.0–34.0)
MCHC: 34.1 g/dL (ref 30.0–36.0)
MCV: 100.8 fL — ABNORMAL HIGH (ref 80.0–100.0)
Monocytes Absolute: 0.4 10*3/uL (ref 0.1–1.0)
Monocytes Relative: 10 %
Neutro Abs: 2 10*3/uL (ref 1.7–7.7)
Neutrophils Relative %: 56 %
Platelets: 85 10*3/uL — ABNORMAL LOW (ref 150–400)
RBC: 3.66 MIL/uL — ABNORMAL LOW (ref 4.22–5.81)
RDW: 13.5 % (ref 11.5–15.5)
WBC: 3.6 10*3/uL — ABNORMAL LOW (ref 4.0–10.5)
nRBC: 0 % (ref 0.0–0.2)

## 2020-04-21 LAB — COMPREHENSIVE METABOLIC PANEL
ALT: 17 U/L (ref 0–44)
AST: 23 U/L (ref 15–41)
Albumin: 3.6 g/dL (ref 3.5–5.0)
Alkaline Phosphatase: 79 U/L (ref 38–126)
Anion gap: 9 (ref 5–15)
BUN: 14 mg/dL (ref 8–23)
CO2: 23 mmol/L (ref 22–32)
Calcium: 9.2 mg/dL (ref 8.9–10.3)
Chloride: 103 mmol/L (ref 98–111)
Creatinine, Ser: 1.24 mg/dL (ref 0.61–1.24)
GFR calc Af Amer: >60 mL/min (ref >60)
GFR calc non Af Amer: 56 mL/min — ABNORMAL LOW (ref >60)
Glucose, Bld: 215 mg/dL — ABNORMAL HIGH (ref 70–99)
Potassium: 4.2 mmol/L (ref 3.5–5.1)
Sodium: 135 mmol/L (ref 135–145)
Total Bilirubin: 0.7 mg/dL (ref 0.3–1.2)
Total Protein: 6.4 g/dL — ABNORMAL LOW (ref 6.5–8.1)

## 2020-04-21 MED ORDER — SODIUM CHLORIDE 0.9% FLUSH
20.0000 mL | INTRAVENOUS | Status: DC | PRN
  Administered 2020-04-21: 20 mL via INTRAVENOUS

## 2020-04-21 MED ORDER — IOHEXOL 300 MG/ML  SOLN
100.0000 mL | Freq: Once | INTRAMUSCULAR | Status: AC | PRN
  Administered 2020-04-21: 100 mL via INTRAVENOUS

## 2020-04-21 MED ORDER — HEPARIN SOD (PORK) LOCK FLUSH 100 UNIT/ML IV SOLN
500.0000 [IU] | Freq: Once | INTRAVENOUS | Status: AC
  Administered 2020-04-21: 500 [IU] via INTRAVENOUS

## 2020-04-21 NOTE — Patient Instructions (Signed)
[facility] at [facility] Discharge Instructions  Labs drawn from portacath today   Thank you for choosing [facility] at [facility] to provide your oncology and hematology care.  To afford each patient quality time with our provider, please arrive at least 15 minutes before your scheduled appointment time.   If you have a lab appointment with the Cancer Center please come in thru the Main Entrance and check in at the main information desk.  You need to re-schedule your appointment should you arrive 10 or more minutes late.  We strive to give you quality time with our providers, and arriving late affects you and other patients whose appointments are after yours.  Also, if you no show three or more times for appointments you may be dismissed from the clinic at the providers discretion.     Again, thank you for choosing [facility].  Our hope is that these requests will decrease the amount of time that you wait before being seen by our physicians.       _____________________________________________________________  Should you have questions after your visit to [facility], please contact our office at [phone] and follow the prompts.  Our office hours are 8:00 a.m. and 4:30 p.m. Monday - Friday.  Please note that voicemails left after 4:00 p.m. may not be returned until the following business day.  We are closed weekends and major holidays.  You do have access to a nurse 24-7, just call the main number to the clinic [phone] and do not press any options, hold on the line and a nurse will answer the phone.    For prescription refill requests, have your pharmacy contact our office and allow 72 hours.    Due to Covid, you will need to wear a mask upon entering the hospital. If you do not have a mask, a mask will be given to you at the Main Entrance upon arrival. For doctor visits, patients may have 1 support person age 18  or older with them. For treatment visits, patients can not have anyone with them due to social distancing guidelines and our immunocompromised population.     

## 2020-04-21 NOTE — Progress Notes (Signed)
[place] tolerated Port lab draw well without complaints or incident. Port accessed with 20 gauge needle with blood drawn for labs ordered then flushed easily per protocol and de-accessed. VSS Pt discharged self ambulatory in satisfactory condition

## 2020-04-28 ENCOUNTER — Inpatient Hospital Stay (HOSPITAL_COMMUNITY): Payer: Medicare Other | Admitting: Hematology

## 2020-04-29 ENCOUNTER — Other Ambulatory Visit: Payer: Self-pay

## 2020-04-29 ENCOUNTER — Inpatient Hospital Stay (HOSPITAL_BASED_OUTPATIENT_CLINIC_OR_DEPARTMENT_OTHER): Payer: Medicare Other | Admitting: Hematology

## 2020-04-29 VITALS — BP 117/69 | HR 56 | Temp 96.8°F | Resp 18 | Wt 203.4 lb

## 2020-04-29 DIAGNOSIS — C184 Malignant neoplasm of transverse colon: Secondary | ICD-10-CM

## 2020-04-29 DIAGNOSIS — E559 Vitamin D deficiency, unspecified: Secondary | ICD-10-CM | POA: Diagnosis not present

## 2020-04-29 DIAGNOSIS — Z85038 Personal history of other malignant neoplasm of large intestine: Secondary | ICD-10-CM | POA: Diagnosis not present

## 2020-04-29 DIAGNOSIS — E538 Deficiency of other specified B group vitamins: Secondary | ICD-10-CM | POA: Diagnosis not present

## 2020-04-29 DIAGNOSIS — D696 Thrombocytopenia, unspecified: Secondary | ICD-10-CM | POA: Diagnosis not present

## 2020-04-29 DIAGNOSIS — E1142 Type 2 diabetes mellitus with diabetic polyneuropathy: Secondary | ICD-10-CM | POA: Diagnosis not present

## 2020-04-29 DIAGNOSIS — Z933 Colostomy status: Secondary | ICD-10-CM | POA: Diagnosis not present

## 2020-04-29 NOTE — Patient Instructions (Signed)
[facility] at [facility] Discharge Instructions  You were seen today by Dr. Katragadda. He went over your recent results and scans. Dr. Katragadda will see you back in 4 months for labs and follow up.   Thank you for choosing [facility] at [facility] to provide your oncology and hematology care.  To afford each patient quality time with our provider, please arrive at least 15 minutes before your scheduled appointment time.   If you have a lab appointment with the Cancer Center please come in thru the Main Entrance and check in at the main information desk  You need to re-schedule your appointment should you arrive 10 or more minutes late.  We strive to give you quality time with our providers, and arriving late affects you and other patients whose appointments are after yours.  Also, if you no show three or more times for appointments you may be dismissed from the clinic at the providers discretion.     Again, thank you for choosing [facility].  Our hope is that these requests will decrease the amount of time that you wait before being seen by our physicians.       _____________________________________________________________  Should you have questions after your visit to  Cancer Center, please contact our office at [phone] between the hours of 8:00 a.m. and 4:30 p.m.  Voicemails left after 4:00 p.m. will not be returned until the following business day.  For prescription refill requests, have your pharmacy contact our office and allow 72 hours.    Cancer Center Support Programs:   > Cancer Support Group  2nd Tuesday of the month 1pm-2pm, Journey Room   

## 2020-04-29 NOTE — Progress Notes (Signed)
[place], [place] [address]   CLINIC:  Medical Oncology/Hematology  PCP:  Glenda Chroman, MD [address] / [place] [address] [phone]   REASON FOR VISIT:  Follow-up for colon cancer  PRIOR THERAPY:  1. Colon resection with colostomy in 09/2007. 2. FOLFOX and radiation from 09/2007 through 02/2008 3. 12 cycles of infusional 5-FU and leucovorin from 10/09/2018 through 03/21/2019.  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[place])   09/29/2018 Cancer Staging   Staging form: Colon and Rectum, AJCC 8th Edition - Clinical stage from 09/29/2018: Stage IIB (cT4a, cN0, cM0) - Signed by Derek Jack, MD on 09/29/2018   10/09/2018 - 04/03/2019 Chemotherapy   The patient had ondansetron (ZOFRAN) 8 mg in sodium chloride 0.9 % 50 mL IVPB, 8 mg (100 % of original dose 8 mg), Intravenous,  Once, 12 of 12 cycles Dose modification: 8 mg (original dose 8 mg, Cycle 1) Administration: 8 mg (10/09/2018), 8 mg (10/23/2018), 8 mg (11/06/2018), 8 mg (11/20/2018), 8 mg (12/04/2018), 8 mg (12/18/2018), 8 mg (01/02/2019), 8 mg (01/15/2019), 8 mg (02/06/2019), 8 mg (02/21/2019), 8 mg (03/07/2019), 8 mg (03/21/2019) leucovorin 800 mg in dextrose 5 % 250 mL infusion, 820 mg, Intravenous,  Once, 12 of 12 cycles Administration: 800 mg (10/09/2018), 820 mg (10/23/2018), 820 mg (11/06/2018), 820 mg (11/20/2018), 820 mg (12/04/2018), 820 mg (12/18/2018), 820 mg (01/02/2019), 820 mg (01/15/2019), 820 mg (02/06/2019), 820 mg (02/21/2019), 820 mg (03/07/2019), 820 mg (03/21/2019) fluorouracil (ADRUCIL) chemo injection 800 mg, 400 mg/m2 = 800 mg, Intravenous,  Once, 12 of 12 cycles Administration: 800 mg (10/09/2018), 800 mg (10/23/2018), 800 mg (11/06/2018), 800 mg (11/20/2018), 800 mg (12/04/2018), 800 mg (12/18/2018), 800 mg (01/02/2019), 800 mg  (01/15/2019), 800 mg (02/06/2019), 800 mg (02/21/2019), 800 mg (03/07/2019), 800 mg (03/21/2019) fluorouracil (ADRUCIL) 4,900 mg in sodium chloride 0.9 % 52 mL chemo infusion, 2,400 mg/m2 = 4,900 mg, Intravenous, 1 Day/Dose, 12 of 12 cycles Administration: 4,900 mg (10/09/2018), 4,900 mg (10/23/2018), 5,000 mg (11/06/2018), 5,000 mg (11/20/2018), 5,000 mg (12/04/2018), 5,000 mg (12/18/2018), 5,000 mg (01/02/2019), 5,000 mg (01/15/2019), 5,000 mg (02/06/2019), 5,000 mg (02/21/2019), 5,000 mg (03/07/2019), 5,000 mg (03/21/2019)  for chemotherapy treatment.      CANCER STAGING: Cancer Staging Cancer of transverse colon [facility]) Staging form: Colon and Rectum, AJCC 8th Edition - Clinical stage from 09/29/2018: Stage IIB (cT4a, cN0, cM0) - Signed by Derek Jack, MD on 09/29/2018   INTERVAL HISTORY:  [name], a 79 y.o. male, returns for routine follow-up of his colon cancer. Perseus was last seen on 01/23/2020.  Today he reports feeling well. His energy levels are improving and he denies having any hematochezia or hematuria. He denies any recent infections or antibiotic use. He denies having any new aches or pains. He has numbness in the bottom of his feet at night. His ileostomy continues to have watery stool, which is his usual.   REVIEW OF SYSTEMS:  Review of Systems  Constitutional: Positive for fatigue (mild). Negative for appetite change.  Cardiovascular: Negative for leg swelling.  Gastrointestinal: Negative for blood in stool.  Genitourinary: Negative for hematuria.   Musculoskeletal: Positive for back pain (10/10 back pain sciatica). Negative for arthralgias and myalgias.  Neurological: Positive for numbness (bottom of feet).  All other systems reviewed and are negative.  PAST MEDICAL/SURGICAL HISTORY:  Past Medical History:  Diagnosis Date  . Anemia    Causing interruption in anticoagulation  . Atrial fibrillation and flutter ([place])   . BPH (benign prostatic hypertrophy)   .  Colon cancer ([place])    Status post LAR 2008  . Hypothyroidism   . Lacunar infarction ([place]) 11/2012   RT 3rd NERVE PALSY, SB [name]  . PE (pulmonary embolism)    Temporarily on Eliquis 2017  . Skin cancer    35 years ago  . Type 2 diabetes mellitus ([place])   . Vitamin B 12 deficiency 07/10/2019   Past Surgical History:  Procedure Laterality Date  . ABDOMINOPERINEAL PROCTOCOLECTOMY  2009   end colostomy   . BIOPSY  08/25/2018   Procedure: BIOPSY;  Surgeon: Rogene Houston, MD;  Location: [facility] END[facility];  Service: Endoscopy;;  . BLADDER REPAIR  08/29/2018   Procedure: BLADDER REPAIR;  Surgeon: [name], MD;  Location: [facility] ORS;  Service: General;;  . COLECTOMY  2008  . COLONOSCOPY WITH PROPOFOL N/A 08/25/2018   Procedure: COLONOSCOPY WITH PROPOFOL;  Surgeon: Rogene Houston, MD;  Location: [facility] END[facility];  Service: Endoscopy;  Laterality: N/A;  2:20  . COLOSTOMY    . EXPLORATORY LAPAROTOMY  2017   ? pneumoperitoneum of unknown etiology/ negative Ex lap  . ILEOSTOMY  08/29/2018   Procedure: ILEOSTOMY;  Surgeon: [name], MD;  Location: [facility] ORS;  Service: General;;  . LAPAROSCOPIC CHOLECYSTECTOMY  2007  . LYSIS OF ADHESION  08/29/2018   Procedure: LYSIS OF ADHESION;  Surgeon: [name], MD;  Location: [facility] ORS;  Service: General;;  . PARTIAL COLECTOMY N/A 08/29/2018   Procedure: PARTIAL COLECTOMY;  Surgeon: [name], MD;  Location: [facility] ORS;  Service: General;  Laterality: N/A;  . PORTACATH PLACEMENT Left 10/04/2018   Procedure: INSERTION PORT-A-CATH (attached catheter left subclavian);  Surgeon: [name], MD;  Location: [facility] ORS;  Service: General;  Laterality: Left;  . SBO SURGERY  06/2009    SOCIAL HISTORY:  Social History   Socioeconomic History  . Marital status: Married    Spouse name: Not on file  . Number of children: Not on file  . Years of education: Not on file  . Highest education level: Not on file  Occupational History  .  Occupation: Architect    Comment: [employer]  Tobacco Use  . Smoking status: Former Smoker    Packs/day: 1.00    Years: 27.00    Pack years: 27.00    Types: Cigarettes    Start date: 01/27/1964    Quit date: 01/27/1991    Years since quitting: 29.2  . Smokeless tobacco: Former Systems developer    Types: [place] date: 08/10/1995  Vaping Use  . Vaping Use: Never used  Substance and Sexual Activity  . Alcohol use: Never    Alcohol/week: 0.0 standard drinks  . Drug use: Never  . Sexual activity: Not Currently  Other Topics Concern  . Not on file  Social History Narrative  . Not on file   Social Determinants of Health   Financial Resource Strain:   . Difficulty of Paying Living Expenses: Not on file  Food Insecurity:   . Worried About Charity fundraiser in the Last Year: Not on file  . Ran Out of Food in the Last Year: Not on file  Transportation Needs:   . Lack of Transportation (Medical): Not on file  . Lack  of Transportation (Non-Medical): Not on file  Physical Activity:   . Days of Exercise per Week: Not on file  . Minutes of Exercise per Session: Not on file  Stress:   . Feeling of Stress : Not on file  Social Connections:   . Frequency of Communication with Friends and Family: Not on file  . Frequency of Social Gatherings with Friends and Family: Not on file  . Attends Religious Services: Not on file  . Active Member of Clubs or Organizations: Not on file  . Attends Archivist Meetings: Not on file  . Marital Status: Not on file  Intimate Partner Violence:   . Fear of Current or Ex-Partner: Not on file  . Emotionally Abused: Not on file  . Physically Abused: Not on file  . Sexually Abused: Not on file    FAMILY HISTORY:  Family History  Problem Relation Age of Onset  . Heart disease Father   . Heart failure Father   . Heart attack Father   . Prostate cancer Paternal Uncle     CURRENT MEDICATIONS:  Current Outpatient Medications  Medication Sig  Dispense Refill  . ergocalciferol (VITAMIN D2) 1.25 MG (50000 UT) capsule Take 1 capsule (50,000 Units total) by mouth once a week. 16 capsule 4  . ferrous sulfate (FERROUSUL) 325 (65 FE) MG tablet Take 1 tablet (325 mg total) by mouth 2 (two) times daily with a meal. 60 tablet 3  . glipiZIDE (GLUCOTROL) 10 MG tablet Take 10 mg by mouth 2 (two) times daily.    Marland Kitchen levothyroxine (SYNTHROID, LEVOTHROID) 50 MCG tablet Take 50 mcg by mouth daily before breakfast.    . lisinopril (ZESTRIL) 10 MG tablet Take 10 mg by mouth daily.    Marland Kitchen loperamide (IMODIUM) 2 MG capsule Take 2 capsules (4 mg total) by mouth 4 (four) times daily -  before meals and at bedtime. 240 capsule 3  . meloxicam (MOBIC) 15 MG tablet     . metFORMIN (GLUCOPHAGE) 500 MG tablet Take by mouth.    . metoprolol tartrate (LOPRESSOR) 25 MG tablet Take 1 tablet (25 mg total) by mouth 2 (two) times daily. 60 tablet 0  . tamsulosin (FLOMAX) 0.4 MG CAPS capsule Take 0.4 mg by mouth at bedtime.     . lidocaine-prilocaine (EMLA) cream Apply to affected area once (Patient not taking: Reported on 04/29/2020) 30 g 3   No current facility-administered medications for this visit.    ALLERGIES:  No Known Allergies  PHYSICAL EXAM:  Performance status (ECOG): 2 - Symptomatic, <50% confined to bed  Vitals:   04/29/20 0800  BP: 117/69  Pulse: (!) 56  Resp: 18  Temp: (!) 96.8 F (36 C)  SpO2: 100%   Wt Readings from Last 3 Encounters:  04/29/20 203 lb 6.4 oz (92.3 kg)  01/23/20 195 lb 4.8 oz (88.6 kg)  10/11/19 195 lb (88.5 kg)   Physical Exam Vitals reviewed.  Constitutional:      Appearance: Normal appearance.  Cardiovascular:     Rate and Rhythm: Normal rate and regular rhythm.     Pulses: Normal pulses.     Heart sounds: Normal heart sounds.  Pulmonary:     Effort: Pulmonary effort is normal.     Breath sounds: Normal breath sounds.  Abdominal:     Palpations: Abdomen is soft. There is no mass.     Tenderness: There is no  abdominal tenderness.     Comments: Colostomy on R side  Musculoskeletal:  Right lower leg: No edema.     Left lower leg: No edema.  Neurological:     General: No focal deficit present.     Mental Status: He is alert and oriented to person, place, and time.  Psychiatric:        Mood and Affect: Mood normal.        Behavior: Behavior normal.      LABORATORY DATA:  I have reviewed the labs as listed.  CBC Latest Ref Rng & Units 04/21/2020 01/16/2020 10/08/2019  WBC 4.0 - 10.5 K/uL 3.6(L) 6.1 8.0  Hemoglobin 13.0 - 17.0 g/dL 12.6(L) 13.5 14.6  Hematocrit 39 - 52 % 36.9(L) 39.9 44.0  Platelets 150 - 400 K/uL 85(L) 133(L) 150   CMP Latest Ref Rng & Units 04/21/2020 01/16/2020 10/08/2019  Glucose 70 - 99 mg/dL 215(H) 241(H) 227(H)  BUN 8 - 23 mg/dL $Remove'14 17 14  'HSqmCuf$ Creatinine 0.61 - 1.24 mg/dL 1.24 1.24 1.06  Sodium 135 - 145 mmol/L 135 132(L) 133(L)  Potassium 3.5 - 5.1 mmol/L 4.2 4.9 5.0  Chloride 98 - 111 mmol/L 103 100 99  CO2 22 - 32 mmol/L $RemoveB'23 24 26  'ZOgCCRfL$ Calcium 8.9 - 10.3 mg/dL 9.2 9.4 9.7  Total Protein 6.5 - 8.1 g/dL 6.4(L) 7.2 8.1  Total Bilirubin 0.3 - 1.2 mg/dL 0.7 0.9 0.9  Alkaline Phos 38 - 126 U/L 79 101 113  AST 15 - 41 U/L 23 29 35  ALT 0 - 44 U/L $Remo'17 20 29    'oHsYf$ DIAGNOSTIC IMAGING:  I have independently reviewed the scans and discussed with the patient. CT Abdomen Pelvis W Contrast  Result Date: 04/21/2020 CLINICAL DATA:  Colon cancer surveillance, status post colectomy with end ileostomy EXAM: CT ABDOMEN AND PELVIS WITH CONTRAST TECHNIQUE: Multidetector CT imaging of the abdomen and pelvis was performed using the standard protocol following bolus administration of intravenous contrast. CONTRAST:  145mL OMNIPAQUE IOHEXOL 300 MG/ML SOLN, additional oral enteric contrast COMPARISON:  10/08/2019 FINDINGS: Lower chest: No acute abnormality. Extensive 3 vessel coronary artery calcifications and/or stents. Hepatobiliary: No focal liver abnormality is seen. Status post cholecystectomy. No  biliary dilatation. Pancreas: Unremarkable. No pancreatic ductal dilatation or surrounding inflammatory changes. Spleen: Normal in size without significant abnormality. Adrenals/Urinary Tract: Adrenal glands are unremarkable. Kidneys are normal, without renal calculi, solid lesion, or hydronephrosis. Bladder is unremarkable. Stomach/Bowel: Stomach is within normal limits. Status post colectomy with right lower quadrant end ileostomy. Unchanged postoperative and post treatment appearance of the low pelvis and presacral soft tissues (series 2, image 70). Vascular/Lymphatic: Aortic atherosclerosis. No enlarged abdominal or pelvic lymph nodes. Reproductive: No mass or other significant abnormality. Other: Unchanged broad-based midline ventral hernia and diastasis. No abdominopelvic ascites. Musculoskeletal: No acute or significant osseous findings. IMPRESSION: 1. Status post colectomy with right lower quadrant end ileostomy. 2. Unchanged postoperative and post treatment appearance of the low pelvis and presacral soft tissues. 3. No evidence of recurrent or metastatic disease in the abdomen or pelvis. 4. Coronary artery disease.  Aortic Atherosclerosis (ICD10-I70.0). Electronically Signed   By: Eddie Candle M.D.   On: 04/21/2020 11:13     ASSESSMENT:  1. Stage IIb (T4N0) transverse colon adenocarcinoma, MSI stable: -History of colon cancer in 2008, status post resection, did not receive any adjuvant chemo. -Recurrence in February 2009, underwent resection with colostomy, followed by 6 months of FOLFOX with radiation. -12 cycles of adjuvant chemotherapy with infusional 5-FU/leucovorin from 10/09/2018-03/21/2019. -CTAP on 10/08/2019 did not show any evidence of recurrence. -CTAP on 04/21/2020  shows unchanged postoperative appearance of the low pelvis and presacral soft tissues with no evidence of recurrence or metastatic disease.   PLAN:  1. Stage IIb (T4N0) transverse colon adenocarcinoma, MSI stable: -Denies  any change in consistency of stool in the ileostomy bag.  No bleeding. -Reviewed CTAP from 04/21/2020 which did not show any evidence of recurrence or metastatic disease. -Reviewed LFTs and CBC.  We will see him back in 4 months for follow-up with repeat labs and CEA.  2. B12 Deficiency: -Continue B12 supplements.  3. Chronic diarrhea: -Continue Imodium as needed.  4. Peripheral neuropathy: -Numbness in the finger tips and feet has been stable from prior oxaliplatin.  5. Vitamin D deficiency: -Continue vitamin D 1000 units daily.  6.  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 -CT scan showed normal-sized spleen.   Orders placed this encounter:  Orders Placed This Encounter  Procedures  . CBC with Differential/Platelet  . Comprehensive metabolic panel  . CEA  . VITAMIN D 25 Hydroxy (Vit-D Deficiency, Fractures)     Derek Jack, MD [place] [phone]   I, Milinda Antis, am acting as a scribe for Dr. Sanda Linger.  I, Derek Jack MD, have reviewed the above documentation for accuracy and completeness, and I agree with the above.

## 2020-05-26 DIAGNOSIS — E1165 Type 2 diabetes mellitus with hyperglycemia: Secondary | ICD-10-CM | POA: Diagnosis not present

## 2020-05-26 DIAGNOSIS — M5416 Radiculopathy, lumbar region: Secondary | ICD-10-CM | POA: Diagnosis not present

## 2020-05-26 DIAGNOSIS — Z299 Encounter for prophylactic measures, unspecified: Secondary | ICD-10-CM | POA: Diagnosis not present

## 2020-05-26 DIAGNOSIS — G620 Drug-induced polyneuropathy: Secondary | ICD-10-CM | POA: Diagnosis not present

## 2020-05-26 DIAGNOSIS — I4891 Unspecified atrial fibrillation: Secondary | ICD-10-CM | POA: Diagnosis not present

## 2020-06-06 DIAGNOSIS — E1165 Type 2 diabetes mellitus with hyperglycemia: Secondary | ICD-10-CM | POA: Diagnosis not present

## 2020-06-06 DIAGNOSIS — I10 Essential (primary) hypertension: Secondary | ICD-10-CM | POA: Diagnosis not present

## 2020-06-11 DIAGNOSIS — M5136 Other intervertebral disc degeneration, lumbar region: Secondary | ICD-10-CM | POA: Diagnosis not present

## 2020-06-11 DIAGNOSIS — M9953 Intervertebral disc stenosis of neural canal of lumbar region: Secondary | ICD-10-CM | POA: Diagnosis not present

## 2020-06-11 DIAGNOSIS — M545 Low back pain, unspecified: Secondary | ICD-10-CM | POA: Diagnosis not present

## 2020-06-11 DIAGNOSIS — M47816 Spondylosis without myelopathy or radiculopathy, lumbar region: Secondary | ICD-10-CM | POA: Diagnosis not present

## 2020-06-15 DIAGNOSIS — Z23 Encounter for immunization: Secondary | ICD-10-CM | POA: Diagnosis not present

## 2020-06-17 DIAGNOSIS — I10 Essential (primary) hypertension: Secondary | ICD-10-CM | POA: Diagnosis not present

## 2020-06-17 DIAGNOSIS — I4891 Unspecified atrial fibrillation: Secondary | ICD-10-CM | POA: Diagnosis not present

## 2020-06-17 DIAGNOSIS — E1165 Type 2 diabetes mellitus with hyperglycemia: Secondary | ICD-10-CM | POA: Diagnosis not present

## 2020-06-17 DIAGNOSIS — Z299 Encounter for prophylactic measures, unspecified: Secondary | ICD-10-CM | POA: Diagnosis not present

## 2020-06-17 DIAGNOSIS — I2699 Other pulmonary embolism without acute cor pulmonale: Secondary | ICD-10-CM | POA: Diagnosis not present

## 2020-06-17 DIAGNOSIS — M47816 Spondylosis without myelopathy or radiculopathy, lumbar region: Secondary | ICD-10-CM | POA: Diagnosis not present

## 2020-06-23 DIAGNOSIS — M5416 Radiculopathy, lumbar region: Secondary | ICD-10-CM | POA: Insufficient documentation

## 2020-06-23 DIAGNOSIS — M4317 Spondylolisthesis, lumbosacral region: Secondary | ICD-10-CM | POA: Diagnosis not present

## 2020-06-30 DIAGNOSIS — M5126 Other intervertebral disc displacement, lumbar region: Secondary | ICD-10-CM | POA: Insufficient documentation

## 2020-06-30 DIAGNOSIS — M4696 Unspecified inflammatory spondylopathy, lumbar region: Secondary | ICD-10-CM | POA: Diagnosis not present

## 2020-06-30 DIAGNOSIS — M5416 Radiculopathy, lumbar region: Secondary | ICD-10-CM | POA: Diagnosis not present

## 2020-06-30 DIAGNOSIS — M4690 Unspecified inflammatory spondylopathy, site unspecified: Secondary | ICD-10-CM | POA: Insufficient documentation

## 2020-07-02 DIAGNOSIS — M5126 Other intervertebral disc displacement, lumbar region: Secondary | ICD-10-CM | POA: Diagnosis not present

## 2020-07-02 DIAGNOSIS — Z6827 Body mass index (BMI) 27.0-27.9, adult: Secondary | ICD-10-CM | POA: Diagnosis not present

## 2020-07-02 DIAGNOSIS — R030 Elevated blood-pressure reading, without diagnosis of hypertension: Secondary | ICD-10-CM | POA: Insufficient documentation

## 2020-07-25 DIAGNOSIS — Z299 Encounter for prophylactic measures, unspecified: Secondary | ICD-10-CM | POA: Diagnosis not present

## 2020-07-25 DIAGNOSIS — Z7189 Other specified counseling: Secondary | ICD-10-CM | POA: Diagnosis not present

## 2020-07-25 DIAGNOSIS — I10 Essential (primary) hypertension: Secondary | ICD-10-CM | POA: Diagnosis not present

## 2020-07-25 DIAGNOSIS — Z1331 Encounter for screening for depression: Secondary | ICD-10-CM | POA: Diagnosis not present

## 2020-07-25 DIAGNOSIS — Z6827 Body mass index (BMI) 27.0-27.9, adult: Secondary | ICD-10-CM | POA: Diagnosis not present

## 2020-07-25 DIAGNOSIS — Z79899 Other long term (current) drug therapy: Secondary | ICD-10-CM | POA: Diagnosis not present

## 2020-07-25 DIAGNOSIS — E7800 Pure hypercholesterolemia, unspecified: Secondary | ICD-10-CM | POA: Diagnosis not present

## 2020-07-25 DIAGNOSIS — Z1339 Encounter for screening examination for other mental health and behavioral disorders: Secondary | ICD-10-CM | POA: Diagnosis not present

## 2020-07-25 DIAGNOSIS — R5383 Other fatigue: Secondary | ICD-10-CM | POA: Diagnosis not present

## 2020-07-25 DIAGNOSIS — Z0000 Encounter for general adult medical examination without abnormal findings: Secondary | ICD-10-CM | POA: Diagnosis not present

## 2020-07-25 DIAGNOSIS — E039 Hypothyroidism, unspecified: Secondary | ICD-10-CM | POA: Diagnosis not present

## 2020-07-25 DIAGNOSIS — Z125 Encounter for screening for malignant neoplasm of prostate: Secondary | ICD-10-CM | POA: Diagnosis not present

## 2020-08-08 DIAGNOSIS — E1165 Type 2 diabetes mellitus with hyperglycemia: Secondary | ICD-10-CM | POA: Diagnosis not present

## 2020-08-08 DIAGNOSIS — I10 Essential (primary) hypertension: Secondary | ICD-10-CM | POA: Diagnosis not present

## 2020-08-22 ENCOUNTER — Inpatient Hospital Stay (HOSPITAL_COMMUNITY): Payer: Medicare Other | Attending: Hematology

## 2020-08-22 ENCOUNTER — Other Ambulatory Visit: Payer: Self-pay

## 2020-08-22 DIAGNOSIS — Z7984 Long term (current) use of oral hypoglycemic drugs: Secondary | ICD-10-CM | POA: Diagnosis not present

## 2020-08-22 DIAGNOSIS — Z86711 Personal history of pulmonary embolism: Secondary | ICD-10-CM | POA: Insufficient documentation

## 2020-08-22 DIAGNOSIS — E538 Deficiency of other specified B group vitamins: Secondary | ICD-10-CM | POA: Insufficient documentation

## 2020-08-22 DIAGNOSIS — N400 Enlarged prostate without lower urinary tract symptoms: Secondary | ICD-10-CM | POA: Insufficient documentation

## 2020-08-22 DIAGNOSIS — Z8673 Personal history of transient ischemic attack (TIA), and cerebral infarction without residual deficits: Secondary | ICD-10-CM | POA: Insufficient documentation

## 2020-08-22 DIAGNOSIS — C184 Malignant neoplasm of transverse colon: Secondary | ICD-10-CM | POA: Insufficient documentation

## 2020-08-22 DIAGNOSIS — Z79899 Other long term (current) drug therapy: Secondary | ICD-10-CM | POA: Insufficient documentation

## 2020-08-22 DIAGNOSIS — Z87891 Personal history of nicotine dependence: Secondary | ICD-10-CM | POA: Diagnosis not present

## 2020-08-22 DIAGNOSIS — E875 Hyperkalemia: Secondary | ICD-10-CM | POA: Insufficient documentation

## 2020-08-22 DIAGNOSIS — K529 Noninfective gastroenteritis and colitis, unspecified: Secondary | ICD-10-CM | POA: Insufficient documentation

## 2020-08-22 DIAGNOSIS — Z85828 Personal history of other malignant neoplasm of skin: Secondary | ICD-10-CM | POA: Diagnosis not present

## 2020-08-22 DIAGNOSIS — I4891 Unspecified atrial fibrillation: Secondary | ICD-10-CM | POA: Insufficient documentation

## 2020-08-22 DIAGNOSIS — E559 Vitamin D deficiency, unspecified: Secondary | ICD-10-CM | POA: Diagnosis not present

## 2020-08-22 DIAGNOSIS — Z933 Colostomy status: Secondary | ICD-10-CM | POA: Diagnosis not present

## 2020-08-22 DIAGNOSIS — Z923 Personal history of irradiation: Secondary | ICD-10-CM | POA: Insufficient documentation

## 2020-08-22 DIAGNOSIS — E1142 Type 2 diabetes mellitus with diabetic polyneuropathy: Secondary | ICD-10-CM | POA: Diagnosis not present

## 2020-08-22 DIAGNOSIS — D696 Thrombocytopenia, unspecified: Secondary | ICD-10-CM | POA: Diagnosis not present

## 2020-08-22 DIAGNOSIS — Z9221 Personal history of antineoplastic chemotherapy: Secondary | ICD-10-CM | POA: Diagnosis not present

## 2020-08-22 DIAGNOSIS — E039 Hypothyroidism, unspecified: Secondary | ICD-10-CM | POA: Diagnosis not present

## 2020-08-22 LAB — CBC WITH DIFFERENTIAL/PLATELET
Abs Immature Granulocytes: 0.04 10*3/uL (ref 0.00–0.07)
Basophils Absolute: 0.1 10*3/uL (ref 0.0–0.1)
Basophils Relative: 1 %
Eosinophils Absolute: 0.1 10*3/uL (ref 0.0–0.5)
Eosinophils Relative: 2 %
HCT: 44.9 % (ref 39.0–52.0)
Hemoglobin: 15 g/dL (ref 13.0–17.0)
Immature Granulocytes: 1 %
Lymphocytes Relative: 21 %
Lymphs Abs: 1.3 10*3/uL (ref 0.7–4.0)
MCH: 33.6 pg (ref 26.0–34.0)
MCHC: 33.4 g/dL (ref 30.0–36.0)
MCV: 100.7 fL — ABNORMAL HIGH (ref 80.0–100.0)
Monocytes Absolute: 0.5 10*3/uL (ref 0.1–1.0)
Monocytes Relative: 8 %
Neutro Abs: 4.1 10*3/uL (ref 1.7–7.7)
Neutrophils Relative %: 67 %
Platelets: 122 10*3/uL — ABNORMAL LOW (ref 150–400)
RBC: 4.46 MIL/uL (ref 4.22–5.81)
RDW: 13.5 % (ref 11.5–15.5)
WBC: 6.1 10*3/uL (ref 4.0–10.5)
nRBC: 0 % (ref 0.0–0.2)

## 2020-08-22 LAB — COMPREHENSIVE METABOLIC PANEL
ALT: 33 U/L (ref 0–44)
AST: 35 U/L (ref 15–41)
Albumin: 3.6 g/dL (ref 3.5–5.0)
Alkaline Phosphatase: 97 U/L (ref 38–126)
Anion gap: 8 (ref 5–15)
BUN: 17 mg/dL (ref 8–23)
CO2: 23 mmol/L (ref 22–32)
Calcium: 9.3 mg/dL (ref 8.9–10.3)
Chloride: 100 mmol/L (ref 98–111)
Creatinine, Ser: 1.46 mg/dL — ABNORMAL HIGH (ref 0.61–1.24)
GFR, Estimated: 49 mL/min — ABNORMAL LOW (ref >60)
Glucose, Bld: 336 mg/dL — ABNORMAL HIGH (ref 70–99)
Potassium: 5.4 mmol/L — ABNORMAL HIGH (ref 3.5–5.1)
Sodium: 131 mmol/L — ABNORMAL LOW (ref 135–145)
Total Bilirubin: 1.1 mg/dL (ref 0.3–1.2)
Total Protein: 7.2 g/dL (ref 6.5–8.1)

## 2020-08-22 LAB — VITAMIN D 25 HYDROXY (VIT D DEFICIENCY, FRACTURES): Vit D, 25-Hydroxy: 40.9 ng/mL (ref 30–100)

## 2020-08-23 LAB — CEA: CEA: 1.9 ng/mL (ref 0.0–4.7)

## 2020-08-25 ENCOUNTER — Other Ambulatory Visit (HOSPITAL_COMMUNITY): Payer: Medicare Other

## 2020-08-26 DIAGNOSIS — M47816 Spondylosis without myelopathy or radiculopathy, lumbar region: Secondary | ICD-10-CM | POA: Insufficient documentation

## 2020-08-27 DIAGNOSIS — L905 Scar conditions and fibrosis of skin: Secondary | ICD-10-CM | POA: Diagnosis not present

## 2020-08-27 DIAGNOSIS — H61009 Unspecified perichondritis of external ear, unspecified ear: Secondary | ICD-10-CM | POA: Diagnosis not present

## 2020-08-27 DIAGNOSIS — C44319 Basal cell carcinoma of skin of other parts of face: Secondary | ICD-10-CM | POA: Diagnosis not present

## 2020-08-27 DIAGNOSIS — D485 Neoplasm of uncertain behavior of skin: Secondary | ICD-10-CM | POA: Diagnosis not present

## 2020-08-27 DIAGNOSIS — Z85828 Personal history of other malignant neoplasm of skin: Secondary | ICD-10-CM | POA: Diagnosis not present

## 2020-09-01 ENCOUNTER — Other Ambulatory Visit: Payer: Self-pay

## 2020-09-01 ENCOUNTER — Inpatient Hospital Stay (HOSPITAL_BASED_OUTPATIENT_CLINIC_OR_DEPARTMENT_OTHER): Payer: Medicare Other | Admitting: Hematology

## 2020-09-01 ENCOUNTER — Encounter (HOSPITAL_COMMUNITY): Payer: Self-pay | Admitting: Hematology

## 2020-09-01 VITALS — BP 117/69 | HR 54 | Temp 97.2°F | Resp 18 | Wt 204.0 lb

## 2020-09-01 DIAGNOSIS — Z923 Personal history of irradiation: Secondary | ICD-10-CM | POA: Diagnosis not present

## 2020-09-01 DIAGNOSIS — Z9221 Personal history of antineoplastic chemotherapy: Secondary | ICD-10-CM | POA: Diagnosis not present

## 2020-09-01 DIAGNOSIS — E559 Vitamin D deficiency, unspecified: Secondary | ICD-10-CM | POA: Diagnosis not present

## 2020-09-01 DIAGNOSIS — C184 Malignant neoplasm of transverse colon: Secondary | ICD-10-CM | POA: Diagnosis not present

## 2020-09-01 DIAGNOSIS — K529 Noninfective gastroenteritis and colitis, unspecified: Secondary | ICD-10-CM | POA: Diagnosis not present

## 2020-09-01 DIAGNOSIS — Z933 Colostomy status: Secondary | ICD-10-CM | POA: Diagnosis not present

## 2020-09-01 LAB — BASIC METABOLIC PANEL
Anion gap: 6 (ref 5–15)
BUN: 16 mg/dL (ref 8–23)
CO2: 25 mmol/L (ref 22–32)
Calcium: 9.1 mg/dL (ref 8.9–10.3)
Chloride: 100 mmol/L (ref 98–111)
Creatinine, Ser: 1.22 mg/dL (ref 0.61–1.24)
GFR, Estimated: >60 mL/min (ref >60)
Glucose, Bld: 228 mg/dL — ABNORMAL HIGH (ref 70–99)
Potassium: 4.4 mmol/L (ref 3.5–5.1)
Sodium: 131 mmol/L — ABNORMAL LOW (ref 135–145)

## 2020-09-01 MED ORDER — SODIUM CHLORIDE 0.9% FLUSH
10.0000 mL | INTRAVENOUS | Status: DC | PRN
  Administered 2020-09-01: 10 mL via INTRAVENOUS

## 2020-09-01 MED ORDER — HEPARIN SOD (PORK) LOCK FLUSH 100 UNIT/ML IV SOLN
500.0000 [IU] | Freq: Once | INTRAVENOUS | Status: AC
  Administered 2020-09-01: 500 [IU] via INTRAVENOUS

## 2020-09-01 NOTE — Patient Instructions (Signed)
[place] at [facility] Discharge Instructions  You were seen today by Dr. Delton Coombes. He went over your recent results. You had labs drawn today. Drink plenty of water to keep your kidneys flushed. You will be scheduled for a CT scan of your abdomen before your next visit. Dr. Delton Coombes will see you back in 3 months for labs and follow up.   Thank you for choosing [place] at [facility] to provide your oncology and hematology care.  To afford each patient quality time with our provider, please arrive at least 15 minutes before your scheduled appointment time.   If you have a lab appointment with the [facility] please come in thru the Main Entrance and check in at the main information desk  You need to re-schedule your appointment should you arrive 10 or more minutes late.  We strive to give you quality time with our providers, and arriving late affects you and other patients whose appointments are after yours.  Also, if you no show three or more times for appointments you may be dismissed from the clinic at the providers discretion.     Again, thank you for choosing [facility].  Our hope is that these requests will decrease the amount of time that you wait before being seen by our physicians.       _____________________________________________________________  Should you have questions after your visit to [facility], please contact our office at [phone] between the hours of 8:00 a.m. and 4:30 p.m.  Voicemails left after 4:00 p.m. will not be returned until the following business day.  For prescription refill requests, have your pharmacy contact our office and allow 72 hours.    Cancer Center Support Programs:   > Cancer Support Group  2nd Tuesday of the month 1pm-2pm, Journey Room

## 2020-09-01 NOTE — Progress Notes (Signed)
Jose Lawrence, Jose Lawrence   CLINIC:  Medical Oncology/Hematology  PCP:  Jose Chroman, MD [address] / [place] [address] [phone]   REASON FOR VISIT:  Follow-up for transverse colon cancer  PRIOR THERAPY:  1. Colon resection with colostomy in 09/2007. 2. FOLFOX and radiation from 09/2007 through 02/2008 3. 12 cycles of infusional 5-FU and leucovorin from 10/09/2018 through 03/21/2019.  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[place])  09/23/2018 Initial Diagnosis   Cancer of transverse colon ([place])   09/29/2018 Cancer Staging   Staging form: Colon and Rectum, AJCC 8th Edition - Clinical stage from 09/29/2018: Stage IIB (cT4a, cN0, cM0) - Signed by Jose Jack, MD on 09/29/2018   10/09/2018 - 03/23/2019 Chemotherapy   The patient had ondansetron (ZOFRAN) 8 mg in sodium chloride 0.9 % 50 mL IVPB, 8 mg (100 % of original dose 8 mg), Intravenous,  Once, 12 of 12 cycles Dose modification: 8 mg (original dose 8 mg, Cycle 1) Administration: 8 mg (10/09/2018), 8 mg (10/23/2018), 8 mg (11/06/2018), 8 mg (11/20/2018), 8 mg (12/04/2018), 8 mg (12/18/2018), 8 mg (01/02/2019), 8 mg (01/15/2019), 8 mg (02/06/2019), 8 mg (02/21/2019), 8 mg (03/07/2019), 8 mg (03/21/2019) leucovorin 800 mg in dextrose 5 % 250 mL infusion, 820 mg, Intravenous,  Once, 12 of 12 cycles Administration: 800 mg (10/09/2018), 820 mg (10/23/2018), 820 mg (11/06/2018), 820 mg (11/20/2018), 820 mg (12/04/2018), 820 mg (12/18/2018), 820 mg (01/02/2019), 820 mg (01/15/2019), 820 mg (02/06/2019), 820 mg (02/21/2019), 820 mg (03/07/2019), 820 mg (03/21/2019) fluorouracil (ADRUCIL) chemo injection 800 mg, 400 mg/m2 = 800 mg, Intravenous,  Once, 12 of 12 cycles Administration: 800 mg (10/09/2018), 800 mg (10/23/2018), 800 mg (11/06/2018), 800 mg (11/20/2018), 800 mg (12/04/2018), 800 mg (12/18/2018), 800 mg  (01/02/2019), 800 mg (01/15/2019), 800 mg (02/06/2019), 800 mg (02/21/2019), 800 mg (03/07/2019), 800 mg (03/21/2019) fluorouracil (ADRUCIL) 4,900 mg in sodium chloride 0.9 % 52 mL chemo infusion, 2,400 mg/m2 = 4,900 mg, Intravenous, 1 Day/Dose, 12 of 12 cycles Administration: 4,900 mg (10/09/2018), 4,900 mg (10/23/2018), 5,000 mg (11/06/2018), 5,000 mg (11/20/2018), 5,000 mg (12/04/2018), 5,000 mg (12/18/2018), 5,000 mg (01/02/2019), 5,000 mg (01/15/2019), 5,000 mg (02/06/2019), 5,000 mg (02/21/2019), 5,000 mg (03/07/2019), 5,000 mg (03/21/2019)  for chemotherapy treatment.      CANCER STAGING: Cancer Staging Cancer of transverse colon [facility]) Staging form: Colon and Rectum, AJCC 8th Edition - Clinical stage from 09/29/2018: Stage IIB (cT4a, cN0, cM0) - Signed by Jose Jack, MD on 09/29/2018   INTERVAL HISTORY:  Jose Lawrence, a 80 y.o. male, returns for routine follow-up of his transverse colon cancer. Jose Lawrence was last seen on 04/29/2020.   Today he is accompanied by his wife and he reports feeling well. He reports having cortisone injections placed into his back, but denies having any recent infections. He is taking vitamin D weekly, but denies taking a diuretic or potassium tablets. He denies seeing blood in his colostomy or melena and denies changes in his stool consistency. He admits to drinking a soft drink, in particular Dr. Malachi Lawrence, daily more than water. He has several biopsies done on his face which show possible cancer.   REVIEW OF SYSTEMS:  Review of Systems  Constitutional: Positive for fatigue (75%). Negative for appetite change.  Neurological: Positive for numbness (feet & fingertips).  All other systems reviewed and are negative.   PAST MEDICAL/SURGICAL  HISTORY:  Past Medical History:  Diagnosis Date  . Anemia    Causing interruption in anticoagulation  . Atrial fibrillation and flutter ([place])   . BPH (benign prostatic hypertrophy)   . Colon cancer ([place])    Status post LAR  2008  . Hypothyroidism   . Lacunar infarction ([place]) 11/2012   RT 3rd NERVE PALSY, SB Dr. Iona Lawrence  . PE (pulmonary embolism)    Temporarily on Eliquis 2017  . Skin cancer    35 years ago  . Type 2 diabetes mellitus ([place])   . Vitamin B 12 deficiency 07/10/2019   Past Surgical History:  Procedure Laterality Date  . ABDOMINOPERINEAL PROCTOCOLECTOMY  2009   end colostomy   . BIOPSY  08/25/2018   Procedure: BIOPSY;  Surgeon: Jose Houston, MD;  Location: [facility];  Service: Endoscopy;;  . BLADDER REPAIR  08/29/2018   Procedure: BLADDER REPAIR;  Surgeon: Jose Cagey, MD;  Location: [facility] ORS;  Service: General;;  . COLECTOMY  2008  . COLONOSCOPY WITH PROPOFOL N/A 08/25/2018   Procedure: COLONOSCOPY WITH PROPOFOL;  Surgeon: Jose Houston, MD;  Location: [facility];  Service: Endoscopy;  Laterality: N/A;  2:20  . COLOSTOMY    . EXPLORATORY LAPAROTOMY  2017   ? pneumoperitoneum of unknown etiology/ negative Ex lap  . ILEOSTOMY  08/29/2018   Procedure: ILEOSTOMY;  Surgeon: Jose Cagey, MD;  Location: [facility] ORS;  Service: General;;  . LAPAROSCOPIC CHOLECYSTECTOMY  2007  . LYSIS OF ADHESION  08/29/2018   Procedure: LYSIS OF ADHESION;  Surgeon: Jose Cagey, MD;  Location: [facility] OR[facility];  Service: General;;  . PARTIAL COLECTOMY N/A 08/29/2018   Procedure: PARTIAL COLECTOMY;  Surgeon: Jose Cagey, MD;  Location: [facility] ORS;  Service: General;  Laterality: N/A;  . PORTACATH PLACEMENT Left 10/04/2018   Procedure: INSERTION PORT-A-CATH (attached catheter left subclavian);  Surgeon: Jose Cagey, MD;  Location: [facility] ORS;  Service: General;  Laterality: Left;  . SBO SURGERY  06/2009    SOCIAL HISTORY:  Social History   Socioeconomic History  . Marital status: Married    Spouse name: Not on file  . Number of children: Not on file  . Years of education: Not on file  . Highest education level: Not on file  Occupational History  . Occupation: Architect    Comment:  [employer]  Tobacco Use  . Smoking status: Former Smoker    Packs/day: 1.00    Years: 27.00    Pack years: 27.00    Types: Cigarettes    Start date: 01/27/1964    Quit date: 01/27/1991    Years since quitting: 29.6  . Smokeless tobacco: Former Systems developer    Types: [place] date: 08/10/1995  Vaping Use  . Vaping Use: Never used  Substance and Sexual Activity  . Alcohol use: Never    Alcohol/week: 0.0 standard drinks  . Drug use: Never  . Sexual activity: Not Currently  Other Topics Concern  . Not on file  Social History Narrative  . Not on file   Social Determinants of Health   Financial Resource Strain: Low Risk   . Difficulty of Paying Living Expenses: Not hard at all  Food Insecurity: No Food Insecurity  . Worried About Charity fundraiser in the Last Year: Never true  . Ran Out of Food in the Last Year: Never true  Transportation Needs: No Transportation Needs  . Lack of Transportation (Medical): No  .  Lack of Transportation (Non-Medical): No  Physical Activity: Sufficiently Active  . Days of Exercise per Week: 7 days  . Minutes of Exercise per Session: 30 min  Stress: No Stress Concern Present  . Feeling of Stress : Not at all  Social Connections: Socially Integrated  . Frequency of Communication with Friends and Family: More than three times a week  . Frequency of Social Gatherings with Friends and Family: More than three times a week  . Attends Religious Services: More than 4 times per year  . Active Member of Clubs or Organizations: Yes  . Attends Archivist Meetings: More than 4 times per year  . Marital Status: Married  Human resources officer Violence: Not At Risk  . Fear of Current or Ex-Partner: No  . Emotionally Abused: No  . Physically Abused: No  . Sexually Abused: No    FAMILY HISTORY:  Family History  Problem Relation Age of Onset  . Heart disease Father   . Heart failure Father   . Heart attack Father   . Prostate cancer Paternal Uncle      CURRENT MEDICATIONS:  Current Outpatient Medications  Medication Sig Dispense Refill  . ergocalciferol (VITAMIN D2) 1.25 MG (50000 UT) capsule Take 1 capsule (50,000 Units total) by mouth once a week. 16 capsule 4  . ferrous sulfate (FERROUSUL) 325 (65 FE) MG tablet Take 1 tablet (325 mg total) by mouth 2 (two) times daily with a meal. 60 tablet 3  . glipiZIDE (GLUCOTROL) 10 MG tablet Take 10 mg by mouth 2 (two) times daily.    Marland Kitchen levothyroxine (SYNTHROID, LEVOTHROID) 50 MCG tablet Take 50 mcg by mouth daily before breakfast.    . lidocaine-prilocaine (EMLA) cream Apply to affected area once (Patient not taking: Reported on 04/29/2020) 30 g 3  . lisinopril (ZESTRIL) 10 MG tablet Take 10 mg by mouth daily.    Marland Kitchen loperamide (IMODIUM) 2 MG capsule Take 2 capsules (4 mg total) by mouth 4 (four) times daily -  before meals and at bedtime. 240 capsule 3  . metoprolol tartrate (LOPRESSOR) 25 MG tablet Take 1 tablet (25 mg total) by mouth 2 (two) times daily. 60 tablet 0  . tamsulosin (FLOMAX) 0.4 MG CAPS capsule Take 0.4 mg by mouth at bedtime.      No current facility-administered medications for this visit.    ALLERGIES:  No Known Allergies  PHYSICAL EXAM:  Performance status (ECOG): 2 - Symptomatic, <50% confined to bed  Vitals:   09/01/20 0801  BP: 117/69  Pulse: (!) 54  Resp: 18  Temp: (!) 97.2 F (36.2 C)  SpO2: 98%   Wt Readings from Last 3 Encounters:  09/01/20 204 lb (92.5 kg)  04/29/20 203 lb 6.4 oz (92.3 kg)  01/23/20 195 lb 4.8 oz (88.6 kg)   Physical Exam Vitals reviewed.  Constitutional:      Appearance: Normal appearance. He is obese.  Cardiovascular:     Rate and Rhythm: Normal rate and regular rhythm.     Pulses: Normal pulses.     Heart sounds: Normal heart sounds.  Pulmonary:     Effort: Pulmonary effort is normal.     Breath sounds: Normal breath sounds.  Abdominal:     Palpations: Abdomen is soft. There is no hepatomegaly or mass.     Tenderness:  There is no abdominal tenderness.     Hernia: No hernia is present.  Neurological:     General: No focal deficit present.  Mental Status: He is alert and oriented to person, place, and time.  Psychiatric:        Mood and Affect: Mood normal.        Behavior: Behavior normal.      LABORATORY DATA:  I have reviewed the labs as listed.  CBC Latest Ref Rng & Units 08/22/2020 04/21/2020 01/16/2020  WBC 4.0 - 10.5 K/uL 6.1 3.6(L) 6.1  Hemoglobin 13.0 - 17.0 g/dL 15.0 12.6(L) 13.5  Hematocrit 39.0 - 52.0 % 44.9 36.9(L) 39.9  Platelets 150 - 400 K/uL 122(L) 85(L) 133(L)   CMP Latest Ref Rng & Units 08/22/2020 04/21/2020 01/16/2020  Glucose 70 - 99 mg/dL 336(H) 215(H) 241(H)  BUN 8 - 23 mg/dL $Remove'17 14 17  'DLensva$ Creatinine 0.61 - 1.24 mg/dL 1.46(H) 1.24 1.24  Sodium 135 - 145 mmol/L 131(L) 135 132(L)  Potassium 3.5 - 5.1 mmol/L 5.4(H) 4.2 4.9  Chloride 98 - 111 mmol/L 100 103 100  CO2 22 - 32 mmol/L $RemoveB'23 23 24  'pwcXkmYa$ Calcium 8.9 - 10.3 mg/dL 9.3 9.2 9.4  Total Protein 6.5 - 8.1 g/dL 7.2 6.4(L) 7.2  Total Bilirubin 0.3 - 1.2 mg/dL 1.1 0.7 0.9  Alkaline Phos 38 - 126 U/L 97 79 101  AST 15 - 41 U/L 35 23 29  ALT 0 - 44 U/L 33 17 20   Lab Results  Component Value Date   VD25OH 40.90 08/22/2020   VD25OH 27.37 (L) 01/16/2020   VD25OH 24.04 (L) 10/08/2019   Lab Results  Component Value Date   CEA1 1.9 08/22/2020   CEA1 2.1 01/16/2020   CEA1 3.1 10/08/2019    DIAGNOSTIC IMAGING:  I have independently reviewed the scans and discussed with the patient. No results found.   ASSESSMENT:  1. Stage IIb (T4N0) transverse colon adenocarcinoma, MSI stable: -History of colon cancer in 2008, status post resection, did not receive any adjuvant chemo. -Recurrence in February 2009, underwent resection with colostomy, followed by 6 months of FOLFOX with radiation. -12 cycles of adjuvant chemotherapy with infusional 5-FU/leucovorin from 10/09/2018-03/21/2019. -CTAP on 10/08/2019 did not show any evidence of  recurrence. -CTAP on 04/21/2020 shows unchanged postoperative appearance of the low pelvis and presacral soft tissues with no evidence of recurrence or metastatic disease.   PLAN:  1. Stage IIb (T4N0) transverse colon adenocarcinoma, MSI stable: -Denies any change in consistency of stool in the colostomy bag. Denies any bleeding. -Reviewed labs from 08/22/2020. CEA is 1.9. LFTs are normal. -RTC 3 months with CTAP, CEA and routine labs. -Continue port flush every 3 months.  2.B12Deficiency: -Continue B12 supplements.  3. Chronic diarrhea: -Continue Imodium as needed.  4. Peripheral neuropathy: -Peripheral neuropathy in the fingertips and feet has been stable.  5. Vitamin D deficiency: -Continue vitamin D 1000 units daily.  6.  Moderate thrombocytopenia: -CT scan showed normal-sized spleen. He has mild to moderate thrombocytopenia, likely immune related. Latest platelet count improved to 122.  7. Hyperkalemia/worsening renal function: -His creatinine was increased to 1.46 and potassium 5.4. -Repeated labs today which showed potassium normalized to 4.4 and creatinine to his baseline 1.22.   Orders placed this encounter:  Orders Placed This Encounter  Procedures  . Basic metabolic panel     Jose Jack, MD [place] [phone]   I, Milinda Antis, am acting as a scribe for Dr. Sanda Linger.  I, Jose Jack MD, have reviewed the above documentation for accuracy and completeness, and I agree with the above.

## 2020-09-01 NOTE — Progress Notes (Signed)
Patient here today for office visit with Dr. Delton Coombes and port flush.  Port accessed and flushed without difficulty.  Unable to draw labs from port.    Labs drawn with butterfly from left AC.  bandaid applied.    Patient discharged ambulatory and in stable condition from clinic.  Follow up as scheduled.

## 2020-09-08 DIAGNOSIS — I10 Essential (primary) hypertension: Secondary | ICD-10-CM | POA: Diagnosis not present

## 2020-09-08 DIAGNOSIS — E1165 Type 2 diabetes mellitus with hyperglycemia: Secondary | ICD-10-CM | POA: Diagnosis not present

## 2020-09-10 DIAGNOSIS — I700 Atherosclerosis of aorta: Secondary | ICD-10-CM | POA: Diagnosis not present

## 2020-09-10 DIAGNOSIS — E1165 Type 2 diabetes mellitus with hyperglycemia: Secondary | ICD-10-CM | POA: Diagnosis not present

## 2020-09-10 DIAGNOSIS — Z299 Encounter for prophylactic measures, unspecified: Secondary | ICD-10-CM | POA: Diagnosis not present

## 2020-09-10 DIAGNOSIS — G620 Drug-induced polyneuropathy: Secondary | ICD-10-CM | POA: Diagnosis not present

## 2020-09-10 DIAGNOSIS — Z932 Ileostomy status: Secondary | ICD-10-CM | POA: Diagnosis not present

## 2020-09-15 DIAGNOSIS — C44311 Basal cell carcinoma of skin of nose: Secondary | ICD-10-CM | POA: Diagnosis not present

## 2020-09-15 DIAGNOSIS — C44319 Basal cell carcinoma of skin of other parts of face: Secondary | ICD-10-CM | POA: Diagnosis not present

## 2020-09-17 ENCOUNTER — Other Ambulatory Visit (HOSPITAL_COMMUNITY): Payer: Self-pay | Admitting: *Deleted

## 2020-09-17 DIAGNOSIS — E559 Vitamin D deficiency, unspecified: Secondary | ICD-10-CM

## 2020-09-19 ENCOUNTER — Other Ambulatory Visit (HOSPITAL_COMMUNITY): Payer: Self-pay

## 2020-09-19 DIAGNOSIS — E559 Vitamin D deficiency, unspecified: Secondary | ICD-10-CM

## 2020-09-19 MED ORDER — ERGOCALCIFEROL 1.25 MG (50000 UT) PO CAPS: 50000.0000 [IU] | ORAL_CAPSULE | ORAL | 4 refills | Status: DC

## 2020-10-06 DIAGNOSIS — I10 Essential (primary) hypertension: Secondary | ICD-10-CM | POA: Diagnosis not present

## 2020-10-06 DIAGNOSIS — E1165 Type 2 diabetes mellitus with hyperglycemia: Secondary | ICD-10-CM | POA: Diagnosis not present

## 2020-10-29 DIAGNOSIS — C44311 Basal cell carcinoma of skin of nose: Secondary | ICD-10-CM | POA: Diagnosis not present

## 2020-11-24 ENCOUNTER — Other Ambulatory Visit: Payer: Self-pay

## 2020-11-24 ENCOUNTER — Encounter (HOSPITAL_COMMUNITY): Payer: Self-pay

## 2020-11-24 ENCOUNTER — Inpatient Hospital Stay (HOSPITAL_COMMUNITY): Payer: Medicare Other | Attending: Hematology

## 2020-11-24 DIAGNOSIS — Z87891 Personal history of nicotine dependence: Secondary | ICD-10-CM | POA: Diagnosis not present

## 2020-11-24 DIAGNOSIS — E875 Hyperkalemia: Secondary | ICD-10-CM | POA: Diagnosis not present

## 2020-11-24 DIAGNOSIS — G620 Drug-induced polyneuropathy: Secondary | ICD-10-CM | POA: Diagnosis not present

## 2020-11-24 DIAGNOSIS — C184 Malignant neoplasm of transverse colon: Secondary | ICD-10-CM | POA: Diagnosis not present

## 2020-11-24 DIAGNOSIS — E538 Deficiency of other specified B group vitamins: Secondary | ICD-10-CM | POA: Insufficient documentation

## 2020-11-24 DIAGNOSIS — D696 Thrombocytopenia, unspecified: Secondary | ICD-10-CM | POA: Diagnosis not present

## 2020-11-24 DIAGNOSIS — Z923 Personal history of irradiation: Secondary | ICD-10-CM | POA: Insufficient documentation

## 2020-11-24 DIAGNOSIS — K529 Noninfective gastroenteritis and colitis, unspecified: Secondary | ICD-10-CM | POA: Diagnosis not present

## 2020-11-24 DIAGNOSIS — Z9221 Personal history of antineoplastic chemotherapy: Secondary | ICD-10-CM | POA: Insufficient documentation

## 2020-11-24 DIAGNOSIS — E559 Vitamin D deficiency, unspecified: Secondary | ICD-10-CM | POA: Diagnosis not present

## 2020-11-24 DIAGNOSIS — Z933 Colostomy status: Secondary | ICD-10-CM | POA: Diagnosis not present

## 2020-11-24 LAB — CBC WITH DIFFERENTIAL/PLATELET
Abs Immature Granulocytes: 0.02 10*3/uL (ref 0.00–0.07)
Basophils Absolute: 0 10*3/uL (ref 0.0–0.1)
Basophils Relative: 1 %
Eosinophils Absolute: 0.1 10*3/uL (ref 0.0–0.5)
Eosinophils Relative: 3 %
HCT: 42.6 % (ref 39.0–52.0)
Hemoglobin: 14.3 g/dL (ref 13.0–17.0)
Immature Granulocytes: 0 %
Lymphocytes Relative: 27 %
Lymphs Abs: 1.3 10*3/uL (ref 0.7–4.0)
MCH: 34 pg (ref 26.0–34.0)
MCHC: 33.6 g/dL (ref 30.0–36.0)
MCV: 101.4 fL — ABNORMAL HIGH (ref 80.0–100.0)
Monocytes Absolute: 0.4 10*3/uL (ref 0.1–1.0)
Monocytes Relative: 9 %
Neutro Abs: 2.8 10*3/uL (ref 1.7–7.7)
Neutrophils Relative %: 60 %
RBC: 4.2 MIL/uL — ABNORMAL LOW (ref 4.22–5.81)
RDW: 12.8 % (ref 11.5–15.5)
WBC: 4.6 10*3/uL (ref 4.0–10.5)
nRBC: 0 % (ref 0.0–0.2)

## 2020-11-24 LAB — COMPREHENSIVE METABOLIC PANEL
ALT: 23 U/L (ref 0–44)
AST: 30 U/L (ref 15–41)
Albumin: 3.6 g/dL (ref 3.5–5.0)
Alkaline Phosphatase: 102 U/L (ref 38–126)
Anion gap: 7 (ref 5–15)
BUN: 15 mg/dL (ref 8–23)
CO2: 24 mmol/L (ref 22–32)
Calcium: 9.2 mg/dL (ref 8.9–10.3)
Chloride: 103 mmol/L (ref 98–111)
Creatinine, Ser: 1.13 mg/dL (ref 0.61–1.24)
GFR, Estimated: >60 mL/min (ref >60)
Glucose, Bld: 219 mg/dL — ABNORMAL HIGH (ref 70–99)
Potassium: 4.7 mmol/L (ref 3.5–5.1)
Sodium: 134 mmol/L — ABNORMAL LOW (ref 135–145)
Total Bilirubin: 0.7 mg/dL (ref 0.3–1.2)
Total Protein: 7 g/dL (ref 6.5–8.1)

## 2020-11-24 MED ORDER — HEPARIN SOD (PORK) LOCK FLUSH 100 UNIT/ML IV SOLN
500.0000 [IU] | Freq: Once | INTRAVENOUS | Status: AC
  Administered 2020-11-24: 500 [IU] via INTRAVENOUS

## 2020-11-24 MED ORDER — SODIUM CHLORIDE 0.9% FLUSH
10.0000 mL | INTRAVENOUS | Status: DC | PRN
  Administered 2020-11-24: 10 mL via INTRAVENOUS

## 2020-11-24 NOTE — Progress Notes (Signed)
Jose Lawrence's reason for visit today are for labs as scheduled per MD orders.  Venipuncture performed with a 23 gauge butterfly needle to R Antecubital.  Jose Lawrence tolerated venipuncture well and without incident; questions were answered and patient was discharged. Pt stable during and after procedure.  Discharged in stable condition ambulatory.  Reminded pt to go by and get contrast to upcoming CT scan.   Jose Lawrence presented for Portacath access and flush.  Portacath located left chest wall accessed with  H 20 needle.  Sluggish blood return and flushed easily.  Not enough blood return top obtain labs. Portacath flushed with 62ml NS and 500U/52ml Heparin and needle removed intact.  Procedure tolerated well and without incident.

## 2020-11-24 NOTE — Patient Instructions (Signed)
Port flush with labs today.  Follow up as scheduled.  Please call the clinic if you have any questions or concerns.

## 2020-11-25 LAB — CEA: CEA: 1.8 ng/mL (ref 0.0–4.7)

## 2020-11-26 ENCOUNTER — Other Ambulatory Visit: Payer: Self-pay

## 2020-11-26 ENCOUNTER — Ambulatory Visit (HOSPITAL_COMMUNITY)
Admission: RE | Admit: 2020-11-26 | Discharge: 2020-11-26 | Disposition: A | Discharge status: Home / Self Care | Payer: Medicare Other | Source: Ambulatory Visit | Attending: Hematology | Admitting: Hematology

## 2020-11-26 DIAGNOSIS — C184 Malignant neoplasm of transverse colon: Secondary | ICD-10-CM | POA: Diagnosis not present

## 2020-11-26 DIAGNOSIS — Z9049 Acquired absence of other specified parts of digestive tract: Secondary | ICD-10-CM | POA: Diagnosis not present

## 2020-11-26 DIAGNOSIS — I700 Atherosclerosis of aorta: Secondary | ICD-10-CM | POA: Diagnosis not present

## 2020-11-26 DIAGNOSIS — C189 Malignant neoplasm of colon, unspecified: Secondary | ICD-10-CM | POA: Diagnosis not present

## 2020-11-26 MED ORDER — IOHEXOL 300 MG/ML  SOLN
100.0000 mL | Freq: Once | INTRAMUSCULAR | Status: AC | PRN
  Administered 2020-11-26: 100 mL via INTRAVENOUS

## 2020-12-01 ENCOUNTER — Encounter (HOSPITAL_COMMUNITY): Payer: Self-pay | Admitting: Hematology

## 2020-12-01 ENCOUNTER — Other Ambulatory Visit: Payer: Self-pay

## 2020-12-01 ENCOUNTER — Inpatient Hospital Stay (HOSPITAL_BASED_OUTPATIENT_CLINIC_OR_DEPARTMENT_OTHER): Payer: Medicare Other | Admitting: Hematology

## 2020-12-01 VITALS — BP 130/74 | HR 50 | Temp 97.7°F | Resp 16 | Wt 206.5 lb

## 2020-12-01 DIAGNOSIS — G620 Drug-induced polyneuropathy: Secondary | ICD-10-CM | POA: Diagnosis not present

## 2020-12-01 DIAGNOSIS — E559 Vitamin D deficiency, unspecified: Secondary | ICD-10-CM | POA: Diagnosis not present

## 2020-12-01 DIAGNOSIS — C184 Malignant neoplasm of transverse colon: Secondary | ICD-10-CM | POA: Diagnosis not present

## 2020-12-01 DIAGNOSIS — E538 Deficiency of other specified B group vitamins: Secondary | ICD-10-CM | POA: Diagnosis not present

## 2020-12-01 DIAGNOSIS — E875 Hyperkalemia: Secondary | ICD-10-CM | POA: Diagnosis not present

## 2020-12-01 DIAGNOSIS — D696 Thrombocytopenia, unspecified: Secondary | ICD-10-CM | POA: Diagnosis not present

## 2020-12-01 MED ORDER — OCTREOTIDE ACETATE 30 MG IM KIT
PACK | INTRAMUSCULAR | Status: AC
  Filled 2020-12-01: qty 1

## 2020-12-01 NOTE — Patient Instructions (Addendum)
[place] at [facility] Discharge Instructions  You were seen today by Dr. Delton Coombes. He went over your recent results and scans. You will keep getting a CT scan every 6 months. If you see any more specks of blood or frank bleeding around the stoma, make an appointment with Dr. Constance Haw to get it checked out. Dr. Delton Coombes will see you back in 3 months for labs and follow up.   Thank you for choosing [place] at [facility] to provide your oncology and hematology care.  To afford each patient quality time with our provider, please arrive at least 15 minutes before your scheduled appointment time.   If you have a lab appointment with the [facility] please come in thru the Main Entrance and check in at the main information desk  You need to re-schedule your appointment should you arrive 10 or more minutes late.  We strive to give you quality time with our providers, and arriving late affects you and other patients whose appointments are after yours.  Also, if you no show three or more times for appointments you may be dismissed from the clinic at the providers discretion.     Again, thank you for choosing [facility].  Our hope is that these requests will decrease the amount of time that you wait before being seen by our physicians.       _____________________________________________________________  Should you have questions after your visit to [facility], please contact our office at [phone] between the hours of 8:00 a.m. and 4:30 p.m.  Voicemails left after 4:00 p.m. will not be returned until the following business day.  For prescription refill requests, have your pharmacy contact our office and allow 72 hours.    Cancer Center Support Programs:   > Cancer Support Group  2nd Tuesday of the month 1pm-2pm, Journey Room

## 2020-12-01 NOTE — Progress Notes (Signed)
[place], [place] [address]   CLINIC:  Medical Oncology/Hematology  PCP:  Jose Chroman, MD [address] / [place] [address] [phone]   REASON FOR VISIT:  Follow-up for transverse colon cancer  PRIOR THERAPY:  1. Colon resection with colostomy in 09/2007. 2. FOLFOX and radiation from 09/2007 through 02/2008 3. 12 cycles of infusional 5-FU and leucovorin from 10/09/2018 through 03/21/2019.  NGS Results: Foundation 1 MSI--stable  CURRENT THERAPY: Surveillance  BRIEF ONCOLOGIC HISTORY:  Oncology History  Cancer of transverse colon ([place])  09/23/2018 Initial Diagnosis   Cancer of transverse colon ([place])   09/29/2018 Cancer Staging   Staging form: Colon and Rectum, AJCC 8th Edition - Clinical stage from 09/29/2018: Stage IIB (cT4a, cN0, cM0) - Signed by Derek Jack, MD on 09/29/2018   10/09/2018 - 03/23/2019 Chemotherapy   The patient had ondansetron (ZOFRAN) 8 mg in sodium chloride 0.9 % 50 mL IVPB, 8 mg (100 % of original dose 8 mg), Intravenous,  Once, 12 of 12 cycles Dose modification: 8 mg (original dose 8 mg, Cycle 1) Administration: 8 mg (10/09/2018), 8 mg (10/23/2018), 8 mg (11/06/2018), 8 mg (11/20/2018), 8 mg (12/04/2018), 8 mg (12/18/2018), 8 mg (01/02/2019), 8 mg (01/15/2019), 8 mg (02/06/2019), 8 mg (02/21/2019), 8 mg (03/07/2019), 8 mg (03/21/2019) leucovorin 800 mg in dextrose 5 % 250 mL infusion, 820 mg, Intravenous,  Once, 12 of 12 cycles Administration: 800 mg (10/09/2018), 820 mg (10/23/2018), 820 mg (11/06/2018), 820 mg (11/20/2018), 820 mg (12/04/2018), 820 mg (12/18/2018), 820 mg (01/02/2019), 820 mg (01/15/2019), 820 mg (02/06/2019), 820 mg (02/21/2019), 820 mg (03/07/2019), 820 mg (03/21/2019) fluorouracil (ADRUCIL) chemo injection 800 mg, 400 mg/m2 = 800 mg, Intravenous,  Once, 12 of 12 cycles Administration: 800 mg (10/09/2018), 800 mg (10/23/2018), 800 mg (11/06/2018), 800 mg (11/20/2018), 800 mg (12/04/2018), 800 mg (12/18/2018), 800 mg  (01/02/2019), 800 mg (01/15/2019), 800 mg (02/06/2019), 800 mg (02/21/2019), 800 mg (03/07/2019), 800 mg (03/21/2019) fluorouracil (ADRUCIL) 4,900 mg in sodium chloride 0.9 % 52 mL chemo infusion, 2,400 mg/m2 = 4,900 mg, Intravenous, 1 Day/Dose, 12 of 12 cycles Administration: 4,900 mg (10/09/2018), 4,900 mg (10/23/2018), 5,000 mg (11/06/2018), 5,000 mg (11/20/2018), 5,000 mg (12/04/2018), 5,000 mg (12/18/2018), 5,000 mg (01/02/2019), 5,000 mg (01/15/2019), 5,000 mg (02/06/2019), 5,000 mg (02/21/2019), 5,000 mg (03/07/2019), 5,000 mg (03/21/2019)  for chemotherapy treatment.      CANCER STAGING: Cancer Staging Cancer of transverse colon [facility]) Staging form: Colon and Rectum, AJCC 8th Edition - Clinical stage from 09/29/2018: Stage IIB (cT4a, cN0, cM0) - Signed by Derek Jack, MD on 09/29/2018   INTERVAL HISTORY:  Jose Lawrence, a 80 y.o. male, returns for routine follow-up of his transverse colon cancer. [name] was last seen on 09/01/2020.   Today he reports feeling well. He denies having any changes in his BM's or seeing blood in the colostomy bag, nosebleeds, or gum bleeds, though he reports having easy bruising; he does report seeing specks of blood around the stoma when he changes his colostomy bag. His appetite is excellent.   REVIEW OF SYSTEMS:  Review of Systems  Constitutional: Negative for appetite change and fatigue.  HENT:   Negative for nosebleeds.   Gastrointestinal: Positive for diarrhea (watery stool in colostomy). Negative for blood in stool and constipation.  Neurological: Positive for dizziness (intermittent) and numbness (fingertips).  Hematological: Bruises/bleeds easily (easy bruising).  All other systems reviewed and are negative.   PAST MEDICAL/SURGICAL HISTORY:  Past Medical History:  Diagnosis Date  . Anemia    Causing interruption in anticoagulation  . Atrial fibrillation and flutter (HCC)   . BPH (benign prostatic hypertrophy)   . Colon cancer (H[facility])    Status  post LAR 2008  . Hypothyroidism   . Lacunar infarction (HCC) 11/2012   RT 3rd NERVE PALSY, SB Dr. Lita Mains  . PE (pulmonary embolism)    Temporarily on Eliquis 2017  . Skin cancer    35 years ago  . Type 2 diabetes mellitus (HCC)   . Vitamin B 12 deficiency 07/10/2019   Past Surgical History:  Procedure Laterality Date  . ABDOMINOPERINEAL PROCTOCOLECTOMY  2009   end colostomy   . BIOPSY  08/25/2018   Procedure: BIOPSY;  Surgeon: Malissa Hippo, MD;  [facility];  Service: Endoscopy;;  . BLADDER REPAIR  08/29/2018   Procedure: BLADDER REPAIR;  Surgeon: Lucretia Roers, MD;  Location: [facility] ORS;  Service: General;;  . COLECTOMY  2008  . COLONOSCOPY WITH PROPOFOL N/A 08/25/2018   Procedure: COLONOSCOPY WITH PROPOFOL;  Surgeon: Malissa Hippo, MD;  Location: [facility];  Service: Endoscopy;  Laterality: N/A;  2:20  . COLOSTOMY    . EXPLORATORY LAPAROTOMY  2017   ? pneumoperitoneum of unknown etiology/ negative Ex lap  . ILEOSTOMY  08/29/2018   Procedure: ILEOSTOMY;  Surgeon: Lucretia Roers, MD;  Location: [facility] ORS;  Service: General;;  . LAPAROSCOPIC CHOLECYSTECTOMY  2007  . LYSIS OF ADHESION  08/29/2018   Procedure: LYSIS OF ADHESION;  Surgeon: Lucretia Roers, MD;  Location: [facility] ORS;  Service: General;;  . PARTIAL COLECTOMY N/A 08/29/2018   Procedure: PARTIAL COLECTOMY;  Surgeon: Lucretia Roers, MD;  Location: [facility] ORS;  Service: General;  Laterality: N/A;  . PORTACATH PLACEMENT Left 10/04/2018   Procedure: INSERTION PORT-A-CATH (attached catheter left subclavian);  Surgeon: Lucretia Roers, MD;  Location: [facility] ORS;  Service: General;  Laterality: Left;  . SBO SURGERY  06/2009    SOCIAL HISTORY:  Social History   Socioeconomic History  . Marital status: Married    Spouse name: Not on file  . Number of children: Not on file  . Years of education: Not on file  . Highest education level: Not on file  Occupational History  . Occupation: Holiday representative     Comment: [employer]  Tobacco Use  . Smoking status: Former Smoker    Packs/day: 1.00    Years: 27.00    Pack years: 27.00    Types: Cigarettes    Start date: 01/27/1964    Quit date: 01/27/1991    Years since quitting: 29.8  . Smokeless tobacco: Former Neurosurgeon    Types: Chew    Quit date: 08/10/1995  Vaping Use  . Vaping Use: Never used  Substance and Sexual Activity  . Alcohol use: Never    Alcohol/week: 0.0 standard drinks  . Drug use: Never  . Sexual activity: Not Currently  Other Topics Concern  . Not on file  Social History Narrative  . Not on file   Social Determinants of Health   Financial Resource Strain: Low Risk   . Difficulty of Paying Living Expenses: Not hard at all  Food Insecurity: No Food Insecurity  . Worried About Programme researcher, broadcasting/film/video in the Last Year: Never true  . Ran Out of Food in the Last Year: Never true  Transportation Needs: No Transportation Needs  . Lack of Transportation (Medical): No  . Lack of Transportation (Non-Medical): No  Physical Activity: Sufficiently Active  . Days of Exercise per Week: 7 days  . Minutes of Exercise per Session: 30 min  Stress: No Stress Concern Present  . Feeling of Stress : Not at all  Social Connections: Socially Integrated  . Frequency of Communication with Friends and Family: More than three times a week  . Frequency of Social Gatherings with Friends and Family: More than three times a week  . Attends Religious Services: More than 4 times per year  . Active Member of Clubs or Organizations: Yes  . Attends Archivist Meetings: More than 4 times per year  . Marital Status: Married  Human resources officer Violence: Not At Risk  . Fear of Current or Ex-Partner: No  . Emotionally Abused: No  . Physically Abused: No  . Sexually Abused: No    FAMILY HISTORY:  Family History  Problem Relation Age of Onset  . Heart disease Father   . Heart failure Father   . Heart attack Father   . Prostate cancer Paternal Uncle      CURRENT MEDICATIONS:  Current Outpatient Medications  Medication Sig Dispense Refill  . ergocalciferol (VITAMIN D2) 1.25 MG (50000 UT) capsule Take 1 capsule (50,000 Units total) by mouth once a week. 16 capsule 4  . ferrous sulfate (FERROUSUL) 325 (65 FE) MG tablet Take 1 tablet (325 mg total) by mouth 2 (two) times daily with a meal. 60 tablet 3  . glipiZIDE (GLUCOTROL) 10 MG tablet Take 10 mg by mouth 2 (two) times daily.    Marland Kitchen levothyroxine (SYNTHROID, LEVOTHROID) 50 MCG tablet Take 50 mcg by mouth daily before breakfast.    . lidocaine-prilocaine (EMLA) cream Apply to affected area once (Patient not taking: Reported on 04/29/2020) 30 g 3  . lisinopril (ZESTRIL) 10 MG tablet Take 10 mg by mouth daily.    Marland Kitchen loperamide (IMODIUM) 2 MG capsule Take 2 capsules (4 mg total) by mouth 4 (four) times daily -  before meals and at bedtime. 240 capsule 3  . metoprolol tartrate (LOPRESSOR) 25 MG tablet Take 1 tablet (25 mg total) by mouth 2 (two) times daily. 60 tablet 0  . tamsulosin (FLOMAX) 0.4 MG CAPS capsule Take 0.4 mg by mouth at bedtime.      No current facility-administered medications for this visit.    ALLERGIES:  No Known Allergies  PHYSICAL EXAM:  Performance status (ECOG): 2 - Symptomatic, <50% confined to bed  Vitals:   12/01/20 0820  BP: 130/74  Pulse: (!) 50  Resp: 16  Temp: 97.7 F (36.5 C)  SpO2: 98%   Wt Readings from Last 3 Encounters:  12/01/20 206 lb 8 oz (93.7 kg)  09/01/20 204 lb (92.5 kg)  04/29/20 203 lb 6.4 oz (92.3 kg)   Physical Exam   LABORATORY DATA:  I have reviewed the labs as listed.  CBC Latest Ref Rng & Units 11/24/2020 08/22/2020 04/21/2020  WBC 4.0 - 10.5 K/uL 4.6 6.1 3.6(L)  Hemoglobin 13.0 - 17.0 g/dL 14.3 15.0 12.6(L)  Hematocrit 39.0 - 52.0 % 42.6 44.9 36.9(L)  Platelets 150 - 400 K/uL SEE NOTE 122(L) 85(L)   CMP Latest Ref Rng & Units 11/24/2020 09/01/2020 08/22/2020  Glucose 70 - 99 mg/dL 219(H) 228(H) 336(H)  BUN 8 - 23 mg/dL _0 Creatinine 0.61 - 1.24 mg/dL 1.13 1.22 1.46(H)  Sodium 135 - 145 mmol/L 134(L) 131(L) 131(L)  Potassium 3.5 - 5.1 mmol/L 4.7 4.4 5.4(H)  Chloride 98 - 111 mmol/L 103  100 100  CO2 22 - 32 mmol/L _0 Calcium 8.9 - 10.3 mg/dL 9.2 9.1 9.3  Total Protein 6.5 - 8.1 g/dL 7.0 - 7.2  Total Bilirubin 0.3 - 1.2 mg/dL 0.7 - 1.1  Alkaline Phos 38 - 126 U/L 102 - 97  AST 15 - 41 U/L 30 - 35  ALT 0 - 44 U/L 23 - 33   Lab Results  Component Value Date   CEA1 1.8 11/24/2020   CEA1 1.9 08/22/2020   CEA1 2.1 01/16/2020    DIAGNOSTIC IMAGING:  I have independently reviewed the scans and discussed with the patient. CT Abdomen Pelvis W Contrast  Result Date: 11/26/2020 CLINICAL DATA:  Follow-up colorectal carcinoma. Previous surgery, chemotherapy, and radiation therapy. Surveillance. EXAM: CT ABDOMEN AND PELVIS WITH CONTRAST TECHNIQUE: Multidetector CT imaging of the abdomen and pelvis was performed using the standard protocol following bolus administration of intravenous contrast. CONTRAST:  127m OMNIPAQUE IOHEXOL 300 MG/ML  SOLN COMPARISON:  04/21/2020 FINDINGS: Lower Chest: No acute findings. Hepatobiliary: No hepatic masses identified. Prior cholecystectomy. No evidence of biliary obstruction. Pancreas:  No mass or inflammatory changes. Spleen: Within normal limits in size and appearance. Adrenals/Urinary Tract: No masses identified. No evidence of ureteral calculi or hydronephrosis. 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 Ill-defined soft tissue density in the presacral region remains stable, consistent with post treatment changes. No other soft tissue masses identified. No evidence of bowel obstruction, inflammatory process, or abnormal fluid collections. Vascular/Lymphatic: No pathologically enlarged lymph nodes. No acute vascular findings. Aortic atherosclerotic calcification noted. Reproductive:  No mass or other significant abnormality. Other:   None. Musculoskeletal:  No suspicious bone lesions identified. IMPRESSION: Stable post treatment changes in presacral region. No evidence of recurrent or metastatic carcinoma, or other acute findings. Aortic Atherosclerosis (ICD10-I70.0). Electronically Signed   By: JMarlaine HindM.D.   On: 11/26/2020 11:02     ASSESSMENT:  1. Stage IIb (T4N0) transverse colon adenocarcinoma, MSI stable: -History of colon cancer in 2008, status post resection, did not receive any adjuvant chemo. -Recurrence in February 2009, underwent resection with colostomy, followed by 6 months of FOLFOX with radiation. -12 cycles of adjuvant chemotherapy with infusional 5-FU/leucovorin from 10/09/2018-03/21/2019. -CTAP on 10/08/2019 did not show any evidence of recurrence. -CTAP on 04/21/2020 shows unchanged postoperative appearance of the low pelvis and presacral soft tissues with no evidence of recurrence or metastatic disease.   PLAN:  1. Stage IIb (T4N0) transverse colon adenocarcinoma, MSI stable: -Denies any change in consistency of stool in the colostomy bag.  Denies any bleeding. - Reviewed labs from 11/24/2020 which showed normal LFTs and CBC except mild thrombocytopenia.  CEA is 1.8. - Reviewed results of CT [facility] from 11/26/2020 which showed stable posttreatment changes in the presacral region with no evidence of recurrence. - RTC 3 months with repeat CEA and other labs.  We will switch him to every 611-monthisits and yearly scans at next visit.  2.B12Deficiency: -Continue B12 supplements.  3. Chronic diarrhea: -Continue Imodium as needed.  4. Peripheral neuropathy: -Peripheral neuropathy in the fingertips and feet has been stable.  5. Vitamin D deficiency: -Continue vitamin D 1000 units daily.  6. Moderate thrombocytopenia: -CT scan on 11/26/2020 showed normal spleen.  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  7. Hyperkalemia/worsening renal function: -Creatinine improved to 1.13.  Potassium is  normal at 4.7.   Orders placed this encounter:  Orders Placed This Encounter  Procedures  . CBC with Differential/Platelet  . Comprehensive  metabolic panel  . CEA     Derek Jack, MD [place] [phone]   I, Milinda Antis, am acting as a scribe for Dr. Sanda Linger.  I, Derek Jack MD, have reviewed the above documentation for accuracy and completeness, and I agree with the above.

## 2020-12-11 DIAGNOSIS — I700 Atherosclerosis of aorta: Secondary | ICD-10-CM | POA: Diagnosis not present

## 2020-12-11 DIAGNOSIS — I10 Essential (primary) hypertension: Secondary | ICD-10-CM | POA: Diagnosis not present

## 2020-12-11 DIAGNOSIS — I4891 Unspecified atrial fibrillation: Secondary | ICD-10-CM | POA: Diagnosis not present

## 2020-12-11 DIAGNOSIS — Z299 Encounter for prophylactic measures, unspecified: Secondary | ICD-10-CM | POA: Diagnosis not present

## 2020-12-11 DIAGNOSIS — E1165 Type 2 diabetes mellitus with hyperglycemia: Secondary | ICD-10-CM | POA: Diagnosis not present

## 2020-12-23 ENCOUNTER — Inpatient Hospital Stay (HOSPITAL_COMMUNITY)
Admission: EM | Admit: 2020-12-23 | Discharge: 2020-12-27 | DRG: 683 | Disposition: A | Discharge status: Home / Self Care | Payer: Medicare Other | Attending: Internal Medicine | Admitting: Internal Medicine

## 2020-12-23 ENCOUNTER — Other Ambulatory Visit: Payer: Self-pay

## 2020-12-23 ENCOUNTER — Encounter (HOSPITAL_COMMUNITY): Payer: Self-pay | Admitting: Emergency Medicine

## 2020-12-23 ENCOUNTER — Inpatient Hospital Stay (HOSPITAL_COMMUNITY): Payer: Medicare Other

## 2020-12-23 DIAGNOSIS — N179 Acute kidney failure, unspecified: Secondary | ICD-10-CM | POA: Diagnosis present

## 2020-12-23 DIAGNOSIS — E1122 Type 2 diabetes mellitus with diabetic chronic kidney disease: Secondary | ICD-10-CM | POA: Diagnosis present

## 2020-12-23 DIAGNOSIS — Z7984 Long term (current) use of oral hypoglycemic drugs: Secondary | ICD-10-CM

## 2020-12-23 DIAGNOSIS — R17 Unspecified jaundice: Secondary | ICD-10-CM

## 2020-12-23 DIAGNOSIS — Z79899 Other long term (current) drug therapy: Secondary | ICD-10-CM | POA: Diagnosis not present

## 2020-12-23 DIAGNOSIS — I9589 Other hypotension: Secondary | ICD-10-CM

## 2020-12-23 DIAGNOSIS — Z87891 Personal history of nicotine dependence: Secondary | ICD-10-CM | POA: Diagnosis not present

## 2020-12-23 DIAGNOSIS — R7989 Other specified abnormal findings of blood chemistry: Secondary | ICD-10-CM | POA: Diagnosis not present

## 2020-12-23 DIAGNOSIS — E1165 Type 2 diabetes mellitus with hyperglycemia: Secondary | ICD-10-CM | POA: Diagnosis present

## 2020-12-23 DIAGNOSIS — E860 Dehydration: Secondary | ICD-10-CM | POA: Diagnosis present

## 2020-12-23 DIAGNOSIS — Z8042 Family history of malignant neoplasm of prostate: Secondary | ICD-10-CM | POA: Diagnosis not present

## 2020-12-23 DIAGNOSIS — E872 Acidosis, unspecified: Secondary | ICD-10-CM

## 2020-12-23 DIAGNOSIS — E1121 Type 2 diabetes mellitus with diabetic nephropathy: Secondary | ICD-10-CM | POA: Diagnosis not present

## 2020-12-23 DIAGNOSIS — E861 Hypovolemia: Secondary | ICD-10-CM | POA: Diagnosis present

## 2020-12-23 DIAGNOSIS — Z8673 Personal history of transient ischemic attack (TIA), and cerebral infarction without residual deficits: Secondary | ICD-10-CM | POA: Diagnosis not present

## 2020-12-23 DIAGNOSIS — Z8249 Family history of ischemic heart disease and other diseases of the circulatory system: Secondary | ICD-10-CM | POA: Diagnosis not present

## 2020-12-23 DIAGNOSIS — N400 Enlarged prostate without lower urinary tract symptoms: Secondary | ICD-10-CM | POA: Diagnosis present

## 2020-12-23 DIAGNOSIS — N1832 Chronic kidney disease, stage 3b: Secondary | ICD-10-CM | POA: Diagnosis not present

## 2020-12-23 DIAGNOSIS — N1831 Chronic kidney disease, stage 3a: Secondary | ICD-10-CM | POA: Diagnosis present

## 2020-12-23 DIAGNOSIS — E875 Hyperkalemia: Secondary | ICD-10-CM | POA: Diagnosis present

## 2020-12-23 DIAGNOSIS — Z85048 Personal history of other malignant neoplasm of rectum, rectosigmoid junction, and anus: Secondary | ICD-10-CM

## 2020-12-23 DIAGNOSIS — N170 Acute kidney failure with tubular necrosis: Principal | ICD-10-CM | POA: Diagnosis present

## 2020-12-23 DIAGNOSIS — I129 Hypertensive chronic kidney disease with stage 1 through stage 4 chronic kidney disease, or unspecified chronic kidney disease: Secondary | ICD-10-CM | POA: Diagnosis present

## 2020-12-23 DIAGNOSIS — R7401 Elevation of levels of liver transaminase levels: Secondary | ICD-10-CM | POA: Diagnosis not present

## 2020-12-23 DIAGNOSIS — Z932 Ileostomy status: Secondary | ICD-10-CM

## 2020-12-23 DIAGNOSIS — D631 Anemia in chronic kidney disease: Secondary | ICD-10-CM | POA: Diagnosis present

## 2020-12-23 DIAGNOSIS — I4821 Permanent atrial fibrillation: Secondary | ICD-10-CM | POA: Diagnosis present

## 2020-12-23 DIAGNOSIS — E869 Volume depletion, unspecified: Secondary | ICD-10-CM | POA: Diagnosis present

## 2020-12-23 DIAGNOSIS — Z85828 Personal history of other malignant neoplasm of skin: Secondary | ICD-10-CM | POA: Diagnosis not present

## 2020-12-23 DIAGNOSIS — D696 Thrombocytopenia, unspecified: Secondary | ICD-10-CM | POA: Diagnosis present

## 2020-12-23 DIAGNOSIS — Z86711 Personal history of pulmonary embolism: Secondary | ICD-10-CM

## 2020-12-23 DIAGNOSIS — E039 Hypothyroidism, unspecified: Secondary | ICD-10-CM | POA: Diagnosis present

## 2020-12-23 DIAGNOSIS — R112 Nausea with vomiting, unspecified: Secondary | ICD-10-CM | POA: Diagnosis not present

## 2020-12-23 DIAGNOSIS — I10 Essential (primary) hypertension: Secondary | ICD-10-CM | POA: Diagnosis not present

## 2020-12-23 DIAGNOSIS — G620 Drug-induced polyneuropathy: Secondary | ICD-10-CM | POA: Insufficient documentation

## 2020-12-23 DIAGNOSIS — E871 Hypo-osmolality and hyponatremia: Secondary | ICD-10-CM | POA: Diagnosis present

## 2020-12-23 DIAGNOSIS — Z79890 Hormone replacement therapy (postmenopausal): Secondary | ICD-10-CM

## 2020-12-23 DIAGNOSIS — Z9049 Acquired absence of other specified parts of digestive tract: Secondary | ICD-10-CM | POA: Diagnosis not present

## 2020-12-23 DIAGNOSIS — D649 Anemia, unspecified: Secondary | ICD-10-CM | POA: Diagnosis not present

## 2020-12-23 DIAGNOSIS — I4891 Unspecified atrial fibrillation: Secondary | ICD-10-CM | POA: Diagnosis not present

## 2020-12-23 DIAGNOSIS — Z20822 Contact with and (suspected) exposure to covid-19: Secondary | ICD-10-CM | POA: Diagnosis present

## 2020-12-23 DIAGNOSIS — R531 Weakness: Secondary | ICD-10-CM | POA: Diagnosis not present

## 2020-12-23 DIAGNOSIS — D751 Secondary polycythemia: Secondary | ICD-10-CM

## 2020-12-23 DIAGNOSIS — T451X5A Adverse effect of antineoplastic and immunosuppressive drugs, initial encounter: Secondary | ICD-10-CM | POA: Insufficient documentation

## 2020-12-23 LAB — COMPREHENSIVE METABOLIC PANEL
ALT: 35 U/L (ref 0–44)
AST: 44 U/L — ABNORMAL HIGH (ref 15–41)
Albumin: 4.6 g/dL (ref 3.5–5.0)
Alkaline Phosphatase: 131 U/L — ABNORMAL HIGH (ref 38–126)
Anion gap: 13 (ref 5–15)
BUN: 31 mg/dL — ABNORMAL HIGH (ref 8–23)
CO2: 21 mmol/L — ABNORMAL LOW (ref 22–32)
Calcium: 9.8 mg/dL (ref 8.9–10.3)
Chloride: 94 mmol/L — ABNORMAL LOW (ref 98–111)
Creatinine, Ser: 3.69 mg/dL — ABNORMAL HIGH (ref 0.61–1.24)
GFR, Estimated: 16 mL/min — ABNORMAL LOW (ref >60)
Glucose, Bld: 287 mg/dL — ABNORMAL HIGH (ref 70–99)
Potassium: >7.5 mmol/L (ref 3.5–5.1)
Sodium: 128 mmol/L — ABNORMAL LOW (ref 135–145)
Total Bilirubin: 1.5 mg/dL — ABNORMAL HIGH (ref 0.3–1.2)
Total Protein: 9.2 g/dL — ABNORMAL HIGH (ref 6.5–8.1)

## 2020-12-23 LAB — CBC WITH DIFFERENTIAL/PLATELET
Abs Immature Granulocytes: 0.1 10*3/uL — ABNORMAL HIGH (ref 0.00–0.07)
Basophils Absolute: 0.1 10*3/uL (ref 0.0–0.1)
Basophils Relative: 1 %
Eosinophils Absolute: 0.1 10*3/uL (ref 0.0–0.5)
Eosinophils Relative: 1 %
HCT: 52.7 % — ABNORMAL HIGH (ref 39.0–52.0)
Hemoglobin: 17.6 g/dL — ABNORMAL HIGH (ref 13.0–17.0)
Immature Granulocytes: 1 %
Lymphocytes Relative: 10 %
Lymphs Abs: 0.9 10*3/uL (ref 0.7–4.0)
MCH: 33.3 pg (ref 26.0–34.0)
MCHC: 33.4 g/dL (ref 30.0–36.0)
MCV: 99.8 fL (ref 80.0–100.0)
Monocytes Absolute: 1.1 10*3/uL — ABNORMAL HIGH (ref 0.1–1.0)
Monocytes Relative: 12 %
Neutro Abs: 7 10*3/uL (ref 1.7–7.7)
Neutrophils Relative %: 75 %
Platelets: 142 10*3/uL — ABNORMAL LOW (ref 150–400)
RBC: 5.28 MIL/uL (ref 4.22–5.81)
RDW: 12.9 % (ref 11.5–15.5)
WBC: 9.3 10*3/uL (ref 4.0–10.5)
nRBC: 0 % (ref 0.0–0.2)

## 2020-12-23 LAB — GLUCOSE, CAPILLARY
Glucose-Capillary: 183 mg/dL — ABNORMAL HIGH (ref 70–99)
Glucose-Capillary: 132 mg/dL — ABNORMAL HIGH (ref 70–99)
Glucose-Capillary: 115 mg/dL — ABNORMAL HIGH (ref 70–99)

## 2020-12-23 LAB — RESP PANEL BY RT-PCR (FLU A&B, COVID) ARPGX2
Influenza A by PCR: NEGATIVE
Influenza B by PCR: NEGATIVE
SARS Coronavirus 2 by RT PCR: NEGATIVE

## 2020-12-23 LAB — URINALYSIS, ROUTINE W REFLEX MICROSCOPIC
Bilirubin Urine: NEGATIVE
Glucose, UA: NEGATIVE mg/dL
Hgb urine dipstick: NEGATIVE
Ketones, ur: NEGATIVE mg/dL
Leukocytes,Ua: NEGATIVE
Nitrite: NEGATIVE
Protein, ur: NEGATIVE mg/dL
Specific Gravity, Urine: >1.03 — ABNORMAL HIGH (ref 1.005–1.030)
pH: 5 (ref 5.0–8.0)

## 2020-12-23 LAB — TSH: TSH: 6.114 u[IU]/mL — ABNORMAL HIGH (ref 0.350–4.500)

## 2020-12-23 LAB — PHOSPHORUS: Phosphorus: 4.1 mg/dL (ref 2.5–4.6)

## 2020-12-23 LAB — HEMOGLOBIN A1C
Hgb A1c MFr Bld: 9.1 % — ABNORMAL HIGH (ref 4.8–5.6)
Mean Plasma Glucose: 214.47 mg/dL

## 2020-12-23 LAB — LACTIC ACID, PLASMA
Lactic Acid, Venous: 2.7 mmol/L (ref 0.5–1.9)
Lactic Acid, Venous: 2.6 mmol/L (ref 0.5–1.9)

## 2020-12-23 LAB — MAGNESIUM: Magnesium: 1.9 mg/dL (ref 1.7–2.4)

## 2020-12-23 LAB — POTASSIUM: Potassium: 5.7 mmol/L — ABNORMAL HIGH (ref 3.5–5.1)

## 2020-12-23 LAB — MRSA PCR SCREENING: MRSA by PCR: NEGATIVE

## 2020-12-23 MED ORDER — SODIUM CHLORIDE 0.9 % IV SOLN: INTRAVENOUS | Status: AC

## 2020-12-23 MED ORDER — CHLORHEXIDINE GLUCONATE CLOTH 2 % EX PADS
6.0000 | MEDICATED_PAD | Freq: Every day | CUTANEOUS | Status: DC
  Administered 2020-12-23 – 2020-12-27 (×5): 6 via TOPICAL

## 2020-12-23 MED ORDER — SODIUM BICARBONATE 8.4 % IV SOLN: 50.0000 meq | Freq: Once | INTRAVENOUS | Status: AC

## 2020-12-23 MED ORDER — LOPERAMIDE HCL 2 MG PO CAPS
4.0000 mg | ORAL_CAPSULE | Freq: Three times a day (TID) | ORAL | Status: DC
  Administered 2020-12-23 (×2): 4 mg via ORAL
  Filled 2020-12-23 (×2): qty 2

## 2020-12-23 MED ORDER — LACTATED RINGERS IV BOLUS
1000.0000 mL | Freq: Once | INTRAVENOUS | Status: AC
  Administered 2020-12-23: 1000 mL via INTRAVENOUS

## 2020-12-23 MED ORDER — LOPERAMIDE HCL 2 MG PO CAPS
4.0000 mg | ORAL_CAPSULE | Freq: Four times a day (QID) | ORAL | Status: DC
  Administered 2020-12-24 – 2020-12-27 (×15): 4 mg via ORAL
  Filled 2020-12-23 (×15): qty 2

## 2020-12-23 MED ORDER — CALCIUM GLUCONATE 10 % IV SOLN
1.0000 g | Freq: Once | INTRAVENOUS | Status: DC
  Filled 2020-12-23: qty 10

## 2020-12-23 MED ORDER — ONDANSETRON HCL 4 MG PO TABS: 4.0000 mg | ORAL_TABLET | Freq: Four times a day (QID) | ORAL | Status: DC | PRN

## 2020-12-23 MED ORDER — LEVOTHYROXINE SODIUM 50 MCG PO TABS
50.0000 ug | ORAL_TABLET | Freq: Every day | ORAL | Status: DC
  Administered 2020-12-23 – 2020-12-27 (×5): 50 ug via ORAL
  Filled 2020-12-23 (×4): qty 1
  Filled 2020-12-23: qty 2

## 2020-12-23 MED ORDER — SODIUM ZIRCONIUM CYCLOSILICATE 5 G PO PACK
10.0000 g | PACK | Freq: Once | ORAL | Status: AC
  Administered 2020-12-23: 10 g via ORAL
  Filled 2020-12-23: qty 2

## 2020-12-23 MED ORDER — ALBUTEROL SULFATE (2.5 MG/3ML) 0.083% IN NEBU
10.0000 mg | INHALATION_SOLUTION | Freq: Once | RESPIRATORY_TRACT | Status: AC
  Administered 2020-12-23: 10 mg via RESPIRATORY_TRACT
  Filled 2020-12-23: qty 12

## 2020-12-23 MED ORDER — INSULIN ASPART 100 UNIT/ML IJ SOLN
0.0000 [IU] | Freq: Three times a day (TID) | INTRAMUSCULAR | Status: DC
  Administered 2020-12-23: 2 [IU] via SUBCUTANEOUS
  Administered 2020-12-23: 1 [IU] via SUBCUTANEOUS
  Administered 2020-12-24 (×2): 2 [IU] via SUBCUTANEOUS
  Administered 2020-12-25: 1 [IU] via SUBCUTANEOUS
  Administered 2020-12-25 (×2): 5 [IU] via SUBCUTANEOUS
  Administered 2020-12-26: 2 [IU] via SUBCUTANEOUS
  Administered 2020-12-26: 3 [IU] via SUBCUTANEOUS

## 2020-12-23 MED ORDER — ACETAMINOPHEN 650 MG RE SUPP: 650.0000 mg | Freq: Four times a day (QID) | RECTAL | Status: DC | PRN

## 2020-12-23 MED ORDER — HEPARIN SODIUM (PORCINE) 5000 UNIT/ML IJ SOLN
5000.0000 [IU] | Freq: Three times a day (TID) | INTRAMUSCULAR | Status: DC
  Administered 2020-12-23 – 2020-12-24 (×3): 5000 [IU] via SUBCUTANEOUS
  Filled 2020-12-23 (×3): qty 1

## 2020-12-23 MED ORDER — LOPERAMIDE HCL 2 MG PO CAPS
4.0000 mg | ORAL_CAPSULE | Freq: Once | ORAL | Status: AC
  Administered 2020-12-23: 4 mg via ORAL
  Filled 2020-12-23: qty 2

## 2020-12-23 MED ORDER — ACETAMINOPHEN 325 MG PO TABS: 650.0000 mg | ORAL_TABLET | Freq: Four times a day (QID) | ORAL | Status: DC | PRN

## 2020-12-23 MED ORDER — FERROUS SULFATE 325 (65 FE) MG PO TABS
325.0000 mg | ORAL_TABLET | Freq: Two times a day (BID) | ORAL | Status: DC
  Administered 2020-12-23 – 2020-12-27 (×8): 325 mg via ORAL
  Filled 2020-12-23 (×8): qty 1

## 2020-12-23 MED ORDER — SODIUM BICARBONATE 8.4 % IV SOLN
INTRAVENOUS | Status: AC
  Administered 2020-12-23: 50 meq via INTRAVENOUS
  Filled 2020-12-23: qty 50

## 2020-12-23 MED ORDER — ONDANSETRON HCL 4 MG/2ML IJ SOLN: 4.0000 mg | Freq: Four times a day (QID) | INTRAMUSCULAR | Status: DC | PRN

## 2020-12-23 MED ORDER — INSULIN ASPART 100 UNIT/ML IV SOLN
5.0000 [IU] | Freq: Once | INTRAVENOUS | Status: AC
  Administered 2020-12-23: 5 [IU] via INTRAVENOUS

## 2020-12-23 MED ORDER — SODIUM ZIRCONIUM CYCLOSILICATE 10 G PO PACK
10.0000 g | PACK | Freq: Once | ORAL | Status: AC
  Administered 2020-12-23: 10 g via ORAL
  Filled 2020-12-23: qty 1

## 2020-12-23 MED ORDER — INSULIN ASPART 100 UNIT/ML IJ SOLN: 0.0000 [IU] | Freq: Every day | INTRAMUSCULAR | Status: DC

## 2020-12-23 NOTE — H&P (Signed)
History and Physical    DAJOUR PIERPOINT GUR:[id number] DOB: 23-Sep-1940 DOA: 12/23/2020  PCP: Glenda Chroman, MD   Patient coming from: home  I have personally briefly reviewed patient's old medical records in [place]  Chief Complaint: vomiting and weakness.  HPI: Jose Lawrence is a 80 y.o. male with medical history significant of atrial fibrillation, HTN, CKD stage 3b, hx of colon cancer (s/p resection and ileostomy), BPH, type 2 diabetes with nephropathy and hypothyroidism ; who came to ED secondary to generalized weakness and vomiting. Patient also reported for the last 2-3 days increase in his ileostomy output (having to exchange bag approx 12 times a day, he usually does it 2-3 times daily) and also noticed decrease in urine output. No CP, no hematuria, no dysuria, no SOB, no focal weakness, no fever, no abdominal pain or any other complaints.  Patient with two vaccines against covid, but not booster.  ED Course: found significantly dehydrated, hypotensive, with acute on chronic renal failure and hyperkalemic. IVF's initiated and patient received, insulin, sodium bicarbonate, lokelma and albuterol for tx of high potassium. TRH called to admit in the hospital for further evaluation and management.   Review of Systems: As per HPI otherwise all other systems reviewed and are negative.   Past Medical History:  Diagnosis Date  . Anemia    Causing interruption in anticoagulation  . Atrial fibrillation and flutter ([place])   . BPH (benign prostatic hypertrophy)   . Colon cancer ([place])    Status post LAR 2008  . Hypothyroidism   . Lacunar infarction ([place]) 11/2012   RT 3rd NERVE PALSY, SB [name]  . PE (pulmonary embolism)    Temporarily on Eliquis 2017  . Skin cancer    35 years ago  . Type 2 diabetes mellitus ([place])   . Vitamin B 12 deficiency 07/10/2019    Past Surgical History:  Procedure Laterality Date  . ABDOMINOPERINEAL PROCTOCOLECTOMY  2009   end colostomy    . BIOPSY  08/25/2018   Procedure: BIOPSY;  Surgeon: Rogene Houston, MD;  [facility];  Service: Endoscopy;;  . BLADDER REPAIR  08/29/2018   Procedure: BLADDER REPAIR;  Surgeon: [name], MD;  Location: [facility] ORS;  Service: General;;  . COLECTOMY  2008  . COLONOSCOPY WITH PROPOFOL N/A 08/25/2018   Procedure: COLONOSCOPY WITH PROPOFOL;  Surgeon: Rogene Houston, MD;  Location: [facility];  Service: Endoscopy;  Laterality: N/A;  2:20  . COLOSTOMY    . EXPLORATORY LAPAROTOMY  2017   ? pneumoperitoneum of unknown etiology/ negative Ex lap  . ILEOSTOMY  08/29/2018   Procedure: ILEOSTOMY;  Surgeon: [name], MD;  Location: [facility] OR[facility];  Service: General;;  . LAPAROSCOPIC CHOLECYSTECTOMY  2007  . LYSIS OF ADHESION  08/29/2018   Procedure: LYSIS OF ADHESION;  Surgeon: [name], MD;  Location: [facility] OR[facility];  Service: General;;  . PARTIAL COLECTOMY N/A 08/29/2018   Procedure: PARTIAL COLECTOMY;  Surgeon: [name], MD;  Location: [facility] ORS;  Service: General;  Laterality: N/A;  . PORTACATH PLACEMENT Left 10/04/2018   Procedure: INSERTION PORT-A-CATH (attached catheter left subclavian);  Surgeon: [name], MD;  Location: [facility] ORS;  Service: General;  Laterality: Left;  . SBO SURGERY  06/2009    Social History  reports that he quit smoking about 29 years ago. His smoking use included cigarettes. He started smoking about 56 years ago. He has a 27.00 pack-year smoking history. He  quit smokeless tobacco use about 25 years ago.  His smokeless tobacco use included chew. He reports that he does not drink alcohol and does not use drugs.  No Known Allergies  Family History  Problem Relation Age of Onset  . Heart disease Father   . Heart failure Father   . Heart attack Father   . Prostate cancer Paternal Uncle     Prior to Admission medications   Medication Sig Start Date End Date Taking? Authorizing Provider  ergocalciferol (VITAMIN D2) 1.25 MG (50000 UT)  capsule Take 1 capsule (50,000 Units total) by mouth once a week. 09/19/20  Yes Derek Jack, MD  ferrous sulfate (FERROUSUL) 325 (65 FE) MG tablet Take 1 tablet (325 mg total) by mouth 2 (two) times daily with a meal. 07/17/19  Yes Derek Jack, MD  glipiZIDE (GLUCOTROL) 10 MG tablet Take 10 mg by mouth 2 (two) times daily. 07/05/18  Yes [provider]  levothyroxine (SYNTHROID, LEVOTHROID) 50 MCG tablet Take 50 mcg by mouth daily before breakfast.   Yes [provider]  lisinopril (ZESTRIL) 10 MG tablet Take 10 mg by mouth daily. 02/26/19  Yes [provider]  loperamide (IMODIUM) 2 MG capsule Take 2 capsules (4 mg total) by mouth 4 (four) times daily -  before meals and at bedtime. 11/11/18  Yes [name], MD  metoprolol tartrate (LOPRESSOR) 25 MG tablet Take 1 tablet (25 mg total) by mouth 2 (two) times daily. 09/04/18  Yes Kathie Dike, MD  tamsulosin (FLOMAX) 0.4 MG CAPS capsule Take 0.4 mg by mouth at bedtime.    Yes [provider]  lidocaine-prilocaine (EMLA) cream Apply to affected area once Patient not taking: No sig reported 10/09/18   Derek Jack, MD    Physical Exam: Vitals:   12/23/20 0700 12/23/20 0810 12/23/20 0816 12/23/20 0825  BP: (!) 103/55  (!) 85/54   Pulse: 81     Resp: (!) 21     Temp:    97.6 F (36.4 C)  TempSrc:   Oral Oral  SpO2: 98%  100%   Weight:  88.3 kg 88.3 kg   Height:  6' (1.829 m)      Constitutional: NAD, calm, comfortable; complaining of generalized weakness. Vitals:   12/23/20 0700 12/23/20 0810 12/23/20 0816 12/23/20 0825  BP: (!) 103/55  (!) 85/54   Pulse: 81     Resp: (!) 21     Temp:    97.6 F (36.4 C)  TempSrc:   Oral Oral  SpO2: 98%  100%   Weight:  88.3 kg 88.3 kg   Height:  6' (1.829 m)     Eyes: PERRL, lids and conjunctivae normal, no icterus, no nystagmus. ENMT: Mucous membranes are very dry on exam. Posterior pharynx clear of any exudate or lesions.Normal  dentition.  Neck: normal, supple, no masses, no thyromegaly Respiratory: clear to auscultation bilaterally, no wheezing, no crackles. Normal respiratory effort. No accessory muscle use.  Cardiovascular: Rate controlled, no rubs, no gallops, positive murmur; No extremity edema. 2+ pedal pulses. No carotid bruits.  Abdomen: no tenderness, no masses palpated. No hepatosplenomegaly. Bowel sounds positive. Ileostomy bag half full of liquid stool appreciated. Musculoskeletal: no clubbing / cyanosis. No joint deformity upper and lower extremities. Good ROM, no contractures. Normal muscle tone.  Skin: no rashes, lesions, ulcers. No induration Neurologic: CN 2-12 grossly intact. Sensation intact, DTR normal. Strength 5/5 in all 4.  Psychiatric: Normal judgment and insight. Alert and oriented x 3. Normal  mood.   Labs on Admission: I have personally reviewed following labs and imaging studies  CBC: Recent Labs  Lab 12/23/20 0325  WBC 9.3  NEUTROABS 7.0  HGB 17.6*  HCT 52.7*  MCV 99.8  PLT 142*    Basic Metabolic Panel: Recent Labs  Lab 12/23/20 0325 12/23/20 0549  NA 128*  --   K >7.5* 5.7*  CL 94*  --   CO2 21*  --   GLUCOSE 287*  --   BUN 31*  --   CREATININE 3.69*  --   CALCIUM 9.8  --     GFR: Estimated Creatinine Clearance: 18.4 mL/min (A) (by C-G formula based on SCr of 3.69 mg/dL (H)).  Liver Function Tests: Recent Labs  Lab 12/23/20 0325  AST 44*  ALT 35  ALKPHOS 131*  BILITOT 1.5*  PROT 9.2*  ALBUMIN 4.6    Urine analysis:    Component Value Date/Time   APPEARANCEUR Turbid (A) 09/14/2018 1514   GLUCOSEU 2+ (A) 09/14/2018 1514   BILIRUBINUR Negative 09/14/2018 1514   PROTEINUR 3+ (A) 09/14/2018 1514   NITRITE Positive (A) 09/14/2018 1514   LEUKOCYTESUR 1+ (A) 09/14/2018 1514    Radiological Exams on Admission: No results found.  EKG: Independently reviewed. Normal T waves, no acute ischemic changes. Positive atrial fibrillation with slow ventricular  response.   Assessment/Plan 1-acute on chronic renal failure: baseline CKD stage 3b -in the setting of dehydration, poor perfusion, hypotension and continue use of nephrotoxic agents (ACE inhibitor) -continue aggressive fluid resuscitation -minimize the use of nephrotoxic agents -avoid contras and hypotension -check renal US -follow urine output renal function trend  -base on response might need nephrology consultation  2-hyperkalemia -in the setting of worsening renal function -will repeat lokelma dose -close telemetry monitoring -follow electrolytes trend -continue and maintain adequate hydration  3-HTN: presenting with hypotension -hold antihypertensive agents for now -provide adequate amount of IVF's -follow VS's  4-hypothyroidism -continue synthroid  5-a. Fib -rate controlled -will monitor on telemetry -follow electrolytes stability   6-type 2 diabetes with nephropathy -started on SSI -will follow CBG's and check A1C  7-hx of colon cancer -s/p ileostomy -maintain adequate hydration -resume adjusted dose imodium to decrease intestinal transit and favor better fluid reabsorption.   DVT prophylaxis: heparin Code Status:   Full code Family Communication:  No family at bedside Disposition Plan:   Patient is from:  home  Anticipated DC to:  home  Anticipated DC date:  approx 48 hours.  Anticipated DC barriers: Stabilization of volume status and renal function. Consults called:  None  Admission status:  Inpatient, stepdown, LOS > 2 midnights.  Severity of Illness: Moderate severity with high risk for decompensation without acute hospitalization and intravenous therapy.  Barton Dubois MD [facility]  How to contact the [facility] Attending or Consulting provider [place] or covering provider during after hours [place], for this patient?   1. Check the care team in [facility] and look for a) attending/consulting [facility] provider listed and b) the [facility] team listed 2. Log  into [email or web address] and use [place]'s universal password to access. If you do not have the password, please contact the hospital operator. 3. Locate the [facility] provider you are looking for under [facility] and page to a number that you can be directly reached. 4. If you still have difficulty reaching the provider, please page the [facility] (Director on Call) for the Hospitalists listed on amion for assistance.  12/23/2020, 9:12 AM

## 2020-12-23 NOTE — ED Triage Notes (Signed)
Pt c/o n/v/d since yesterday with poor po intake.

## 2020-12-23 NOTE — ED Notes (Addendum)
Pt ambulated to restroom empty colostomy bag.

## 2020-12-23 NOTE — ED Notes (Signed)
Pt placed on cardiac monitor with BP to set cycle every 30 minutes. Continuous pulse oximeter applied.  

## 2020-12-23 NOTE — ED Provider Notes (Addendum)
[facility] EMERGENCY DEPARTMENT Provider Note   CSN: [id number] Arrival date & time: 12/23/20  0209     History Chief Complaint  Patient presents with  . Emesis    Jose Lawrence is a 80 y.o. male.  The history is provided by the patient.  Emesis He has history of hypertension, diabetes, atrial fibrillation not on anticoagulation, colon cancer status post ileostomy and comes in because he feels he is dehydrated.  He had nausea and vomiting earlier in the day and developed massively increased output through his ileostomy tonight.  He states that he would need to change the bag 2 or 3 times an hour.  There has been some abdominal cramping.  He denies fever but has had chills and sweats.  He no longer is having any nausea.  He is feeling weak and lightheaded.  He states that he has not had any urine output for nearly 24 hours.   Past Medical History:  Diagnosis Date  . Anemia    Causing interruption in anticoagulation  . Atrial fibrillation and flutter ([place])   . BPH (benign prostatic hypertrophy)   . Colon cancer ([place])    Status post LAR 2008  . Hypothyroidism   . Lacunar infarction ([place]) 11/2012   RT 3rd NERVE PALSY, SB [name]  . PE (pulmonary embolism)    Temporarily on Eliquis 2017  . Skin cancer    35 years ago  . Type 2 diabetes mellitus ([place])   . Vitamin B 12 deficiency 07/10/2019    Patient Active Problem List   Diagnosis Date Noted  . Vitamin B 12 deficiency 07/10/2019  . Anemia 09/14/2018  . Malaise and fatigue 09/14/2018  . Indwelling Foley catheter present 09/14/2018  . Intraoperative bladder injury 09/04/2018  . Postoperative anemia due to acute blood loss 09/04/2018  . Cancer of transverse colon ([place])   . Iron deficiency anemia due to chronic blood loss 08/25/2018  . Hypothyroidism 08/25/2018  . HTN (hypertension) 08/25/2018  . DM (diabetes mellitus), type 2 ([place]) 08/25/2018  . Colostomy in place [facility]) 08/25/2018  . BPH (benign prostatic  hyperplasia) 08/25/2018  . Atrial fibrillation, chronic ([place]) 08/25/2018  . Mass of colon 08/21/2018    Past Surgical History:  Procedure Laterality Date  . ABDOMINOPERINEAL PROCTOCOLECTOMY  2009   end colostomy   . BIOPSY  08/25/2018   Procedure: BIOPSY;  Surgeon: Rogene Houston, MD;  Location: [facility] ENDO SUITE;  Service: Endoscopy;;  . BLADDER REPAIR  08/29/2018   Procedure: BLADDER REPAIR;  Surgeon: [name], MD;  Location: [facility] ORS;  Service: General;;  . COLECTOMY  2008  . COLONOSCOPY WITH PROPOFOL N/A 08/25/2018   Procedure: COLONOSCOPY WITH PROPOFOL;  Surgeon: Rogene Houston, MD;  Location: [facility] ENDO SUITE;  Service: Endoscopy;  Laterality: N/A;  2:20  . COLOSTOMY    . EXPLORATORY LAPAROTOMY  2017   ? pneumoperitoneum of unknown etiology/ negative Ex lap  . ILEOSTOMY  08/29/2018   Procedure: ILEOSTOMY;  Surgeon: [name], MD;  Location: [facility] ORS;  Service: General;;  . LAPAROSCOPIC CHOLECYSTECTOMY  2007  . LYSIS OF ADHESION  08/29/2018   Procedure: LYSIS OF ADHESION;  Surgeon: [name], MD;  Location: [facility] ORS;  Service: General;;  . PARTIAL COLECTOMY N/A 08/29/2018   Procedure: PARTIAL COLECTOMY;  Surgeon: [name], MD;  Location: [facility] ORS;  Service: General;  Laterality: N/A;  . PORTACATH PLACEMENT Left 10/04/2018   Procedure: INSERTION PORT-A-CATH (attached catheter left subclavian);  Surgeon: [name], MD;  Location: [facility] ORS;  Service: General;  Laterality: Left;  . SBO SURGERY  06/2009       Family History  Problem Relation Age of Onset  . Heart disease Father   . Heart failure Father   . Heart attack Father   . Prostate cancer Paternal Uncle     Social History   Tobacco Use  . Smoking status: Former Smoker    Packs/day: 1.00    Years: 27.00    Pack years: 27.00    Types: Cigarettes    Start date: 01/27/1964    Quit date: 01/27/1991    Years since quitting: 29.9  . Smokeless tobacco: Former Systems developer    Types: [place]  date: 08/10/1995  Vaping Use  . Vaping Use: Never used  Substance Use Topics  . Alcohol use: Never    Alcohol/week: 0.0 standard drinks  . Drug use: Never    Home Medications Prior to Admission medications   Medication Sig Start Date End Date Taking? Authorizing Provider  ergocalciferol (VITAMIN D2) 1.25 MG (50000 UT) capsule Take 1 capsule (50,000 Units total) by mouth once a week. 09/19/20   Derek Jack, MD  ferrous sulfate (FERROUSUL) 325 (65 FE) MG tablet Take 1 tablet (325 mg total) by mouth 2 (two) times daily with a meal. 07/17/19   Derek Jack, MD  glipiZIDE (GLUCOTROL) 10 MG tablet Take 10 mg by mouth 2 (two) times daily. 07/05/18   [provider]  levothyroxine (SYNTHROID, LEVOTHROID) 50 MCG tablet Take 50 mcg by mouth daily before breakfast.    [provider]  lidocaine-prilocaine (EMLA) cream Apply to affected area once Patient not taking: Reported on 04/29/2020 10/09/18   Derek Jack, MD  lisinopril (ZESTRIL) 10 MG tablet Take 10 mg by mouth daily. 02/26/19   [provider]  loperamide (IMODIUM) 2 MG capsule Take 2 capsules (4 mg total) by mouth 4 (four) times daily -  before meals and at bedtime. 11/11/18   [name], MD  metoprolol tartrate (LOPRESSOR) 25 MG tablet Take 1 tablet (25 mg total) by mouth 2 (two) times daily. 09/04/18   Kathie Dike, MD  tamsulosin (FLOMAX) 0.4 MG CAPS capsule Take 0.4 mg by mouth at bedtime.     [provider]    Allergies    Patient has no known allergies.  Review of Systems   Review of Systems  Gastrointestinal: Positive for vomiting.  All other systems reviewed and are negative.   Physical Exam Updated Vital Signs BP (!) 80/55   Pulse 73   Temp (!) 97.5 F (36.4 C) (Oral)   Resp 18   Ht 6' (1.829 m)   Wt 94 kg   SpO2 100%   BMI 28.11 kg/m   Physical Exam Vitals and nursing note reviewed.   80 year old male, resting comfortably and in no acute distress.  Vital signs are significant for low blood pressure. Oxygen saturation is 100%, which is normal. Head is normocephalic and atraumatic. PERRLA, EOMI. Mucous membranes are dry. Neck is nontender and supple without adenopathy or JVD. Back is nontender and there is no CVA tenderness. Lungs are clear without rales, wheezes, or rhonchi. Chest is nontender. Heart has regular rate and rhythm without murmur. Abdomen is soft, flat, nontender.  Ileostomy present in the right lower quadrant.  Peristalsis is diminished. Extremities have no cyanosis or edema, full range of motion is present. Skin is warm and dry without  rash.  Skin turgor is decreased. Neurologic: Mental status is normal, cranial nerves are intact, there are no motor or sensory deficits.  ED Results / Procedures / Treatments   Labs (all labs ordered are listed, but only abnormal results are displayed) Labs Reviewed  COMPREHENSIVE METABOLIC PANEL - Abnormal; Notable for the following components:      Result Value   Sodium 128 (*)    Potassium >7.5 (*)    Chloride 94 (*)    CO2 21 (*)    Glucose, Bld 287 (*)    BUN 31 (*)    Creatinine, Ser 3.69 (*)    Total Protein 9.2 (*)    AST 44 (*)    Alkaline Phosphatase 131 (*)    Total Bilirubin 1.5 (*)    GFR, Estimated 16 (*)    All other components within normal limits  LACTIC ACID, PLASMA - Abnormal; Notable for the following components:   Lactic Acid, Venous 2.7 (*)    All other components within normal limits  CBC WITH DIFFERENTIAL/PLATELET - Abnormal; Notable for the following components:   Hemoglobin 17.6 (*)    HCT 52.7 (*)    Platelets 142 (*)    Monocytes Absolute 1.1 (*)    Abs Immature Granulocytes 0.10 (*)    All other components within normal limits  LACTIC ACID, PLASMA  URINALYSIS, ROUTINE W REFLEX MICROSCOPIC  POTASSIUM    EKG EKG Interpretation  Date/Time:  Tuesday Dec 23 2020 04:30:39 EDT Ventricular Rate:  58 PR Interval:    QRS Duration: 112 QT  Interval:  448 QTC Calculation: 440 R Axis:   -72 Text Interpretation: Atrial fibrillation Incomplete RBBB and LAFB Low voltage, extremity and precordial leads When compared with ECG of 08/21/2018, Nonspecific T wave abnormality is no longer present Confirmed by Delora Fuel ([id number]) on 12/23/2020 4:35:25 AM  Procedures Procedures  CRITICAL CARE Performed by: Delora Fuel Total critical care time: 160 minutes Critical care time was exclusive of separately billable procedures and treating other patients. Critical care was necessary to treat or prevent imminent or life-threatening deterioration. Critical care was time spent personally by me on the following activities: development of treatment plan with patient and/or surrogate as well as nursing, discussions with consultants, evaluation of patient's response to treatment, examination of patient, obtaining history from patient or surrogate, ordering and performing treatments and interventions, ordering and review of laboratory studies, ordering and review of radiographic studies, pulse oximetry and re-evaluation of patient's condition.  Medications Ordered in ED Medications  calcium gluconate inj 10% (1 g) URGENT USE ONLY! (has no administration in time range)  albuterol (PROVENTIL) (2.5 MG/3ML) 0.083% nebulizer solution 10 mg (has no administration in time range)  insulin aspart (novoLOG) injection 5 Units (has no administration in time range)  sodium bicarbonate injection 50 mEq (has no administration in time range)  sodium zirconium cyclosilicate (LOKELMA) packet 10 g (has no administration in time range)  lactated ringers bolus 1,000 mL (has no administration in time range)  lactated ringers bolus 1,000 mL (0 mLs Intravenous Stopped 12/23/20 0432)  loperamide (IMODIUM) capsule 4 mg (4 mg Oral Given 12/23/20 0333)  lactated ringers bolus 1,000 mL (0 mLs Intravenous Stopped 12/23/20 0542)    ED Course  I have reviewed the triage vital signs and  the nursing notes.  Pertinent labs & imaging results that were available during my care of the patient were reviewed by me and considered in my medical decision making (see chart for details).  MDM Rules/Calculators/A&P                         Hypotension with high ileostomy output concerning for dehydration.  He has other physical findings suggesting dehydration.  Will check screening labs and give IV fluids, oral loperamide.  Old records were reviewed confirming history of colon cancer with colon resection and ileostomy.  Blood pressure has not improved with 1 L of lactated Ringer's and additional fluid is given.  Lactic acid level is elevated at 2.7 and this is thought to be secondary to dehydration and not sepsis.  Metabolic panel shows potassium of greater than 7.5.  This was called to the ED and there was concern that he did not have a good reason for hyperkalemia and I would have actually expected hypokalemia given high output from ileostomy.  Repeat potassium and been ordered.  When full metabolic panel became available, it was noted that he had acute renal failure with creatinine 3.69 compared with baseline 1.13 on 4/18.  Hemoglobin is also significantly elevated felt to be secondary to dehydration.  Borderline thrombocytopenia, mild hyponatremia, mild elevation of AST and bilirubin are all felt to be not clinically significant.  Following second liter of IV fluid, he remained hypotensive and an additional liter is being given.  He is also given medication for hyperkalemia including calcium, sodium bicarbonate, insulin, albuterol, sodium zirconium cyclosilicate.  Case is discussed with Dr. Clearence Ped of [facility], who agrees to admit the patient.  Final Clinical Impression(s) / ED Diagnoses Final diagnoses:  Acute kidney injury (nontraumatic) (HCC)  Hyperkalemia  Hypotension due to hypovolemia  Elevated lactic acid level  Hyponatremia  Total bilirubin, elevated  Polycythemia  due to fall in plasma volume  Thrombocytopenia (HCC)  Elevated AST (SGOT)    Rx / DC Orders ED Discharge Orders    None       Delora Fuel, MD 01/74/94 0549  Repeat potassium is still elevated but not nearly as high, 5.7.  This is before meds have been given. No need for calcium.    Delora Fuel, MD 49/67/59 1638    Delora Fuel, MD 46/65/99 [phone]

## 2020-12-24 DIAGNOSIS — E1165 Type 2 diabetes mellitus with hyperglycemia: Secondary | ICD-10-CM

## 2020-12-24 DIAGNOSIS — E872 Acidosis, unspecified: Secondary | ICD-10-CM

## 2020-12-24 DIAGNOSIS — N179 Acute kidney failure, unspecified: Secondary | ICD-10-CM

## 2020-12-24 DIAGNOSIS — R7989 Other specified abnormal findings of blood chemistry: Secondary | ICD-10-CM

## 2020-12-24 DIAGNOSIS — N1831 Chronic kidney disease, stage 3a: Secondary | ICD-10-CM

## 2020-12-24 DIAGNOSIS — D696 Thrombocytopenia, unspecified: Secondary | ICD-10-CM

## 2020-12-24 DIAGNOSIS — E875 Hyperkalemia: Secondary | ICD-10-CM

## 2020-12-24 DIAGNOSIS — I4821 Permanent atrial fibrillation: Secondary | ICD-10-CM

## 2020-12-24 DIAGNOSIS — E871 Hypo-osmolality and hyponatremia: Secondary | ICD-10-CM

## 2020-12-24 LAB — GLUCOSE, CAPILLARY
Glucose-Capillary: 154 mg/dL — ABNORMAL HIGH (ref 70–99)
Glucose-Capillary: 146 mg/dL — ABNORMAL HIGH (ref 70–99)
Glucose-Capillary: 166 mg/dL — ABNORMAL HIGH (ref 70–99)

## 2020-12-24 LAB — CBC
HCT: 47.2 % (ref 39.0–52.0)
Hemoglobin: 15.7 g/dL (ref 13.0–17.0)
MCH: 33.1 pg (ref 26.0–34.0)
MCHC: 33.3 g/dL (ref 30.0–36.0)
MCV: 99.6 fL (ref 80.0–100.0)
Platelets: 90 10*3/uL — ABNORMAL LOW (ref 150–400)
RBC: 4.74 MIL/uL (ref 4.22–5.81)
RDW: 12.9 % (ref 11.5–15.5)
WBC: 7 10*3/uL (ref 4.0–10.5)
nRBC: 0 % (ref 0.0–0.2)

## 2020-12-24 LAB — BASIC METABOLIC PANEL
Anion gap: 10 (ref 5–15)
BUN: 44 mg/dL — ABNORMAL HIGH (ref 8–23)
CO2: 25 mmol/L (ref 22–32)
Calcium: 9 mg/dL (ref 8.9–10.3)
Chloride: 94 mmol/L — ABNORMAL LOW (ref 98–111)
Creatinine, Ser: 2.48 mg/dL — ABNORMAL HIGH (ref 0.61–1.24)
GFR, Estimated: 26 mL/min — ABNORMAL LOW (ref >60)
Glucose, Bld: 96 mg/dL (ref 70–99)
Potassium: 4.4 mmol/L (ref 3.5–5.1)
Sodium: 129 mmol/L — ABNORMAL LOW (ref 135–145)

## 2020-12-24 MED ORDER — SODIUM CHLORIDE 0.9 % IV SOLN: INTRAVENOUS | Status: DC

## 2020-12-24 MED ORDER — METOPROLOL TARTRATE 25 MG PO TABS
12.5000 mg | ORAL_TABLET | Freq: Two times a day (BID) | ORAL | Status: DC
  Administered 2020-12-24 – 2020-12-27 (×7): 12.5 mg via ORAL
  Filled 2020-12-24 (×7): qty 1

## 2020-12-24 NOTE — Progress Notes (Signed)
PROGRESS NOTE  Jose Lawrence CBU:[id number] DOB: February 19, 1941 DOA: 12/23/2020 PCP: Glenda Chroman, MD  Brief History:  80 year old male with a history of atrial fibrillation, hypertension, CKD stage III, colon adenocarcinoma status post total colectomy, diabetes mellitus type 2, BPH, hypothyroidism, post-operative PE 2017 presenting with 1-2 day history of nausea, vomiting, and increased ileostomy output.  He had denied any fevers, chills, chest pain, shortness of breath, abdominal pain, hematochezia, melena.  He has some generalized weakness.  He denies any new medications, but he states that he takes care of his grandchildren who have had a similar presentation with vomiting and diarrhea approximately 1 week prior to his presentation to the hospital.  In addition, the patient states that he recently ate at a church picnic on 12/21/2020. In the emergency department, the patient was afebrile with soft blood pressures with systolic BP in the 03O.  Oxygen saturation was 97% on room air.  BMP showed a sodium of 128 and serum creatinine 3.69.  AST 44, ALT 45, alk phosphatase 131, total bilirubin 1.5.  WBC 9.3, hemoglobin 17.6, platelets 142,000.  The patient was started on IV fluids.  He states that he has been taking his Imodium, 4 mg twice daily.  Assessment/Plan: Acute on chronic renal failure--CKD stage IIIa -Baseline creatinine 1.1-1.4 -Serum creatinine peaked 3.69 -Secondary to volume depletion and hemodynamic changes -Continue IV fluids -renal US--no hydronephrosis  Hyponatremia -Secondary to volume depletion -Continue normal saline  Lactic acidosis -Secondary to volume depletion -Sepsis ruled out -Lactic acid peaked at 2.7  Permanent atrial fibrillation -Not on anticoagulation due to recurrent anemia -rate controlled -continue metoprolol tartrate  Colon adenocarcinoma stage IIb -Status post total colectomy/LAR 2008 -Patient follows Dr.  Delton Coombes  Thrombocytopenia -This appears to be chronic -Patient follows Dr. Delton Coombes -No splenomegaly on recent CT abd/pelvis  Diabetes mellitus type 2, uncontrolled with hyperglycemia -Continue NovoLog sliding scale -Holding glipizide -12/23/20 A1c--9.1  Hypothyroidism -Continue Synthroid  Essential hypertension -Continue metoprolol tartrate  BPH -Continue Flomax  Hyponatremia -due to volume depletion -continue IVF  Hyperkalemia -improved     Status is: Inpatient  Remains inpatient appropriate because:IV treatments appropriate due to intensity of illness or inability to take PO   Dispo: The patient is from: Home              Anticipated d/c is to: Home              Patient currently is not medically stable to d/c.   Difficult to place patient No        Family Communication:   No Family at bedside  Consultants:  none  Code Status:  FULL  DVT Prophylaxis:   Heparin    Procedures: As Listed in Progress Note Above  Antibiotics: None       Subjective: He is feeling a little better this morning.  His emesis has improved.  He still has a good amount of ileostomy output.  He denies any headache, chest pain, shortness breath, abdominal pain, vomiting.  There is no fevers or chills.  Objective: Vitals:   12/23/20 2000 12/24/20 0504 12/24/20 0607 12/24/20 0800  BP: 109/61 (!) 81/57 98/60 115/83  Pulse: 82 (!) 56  (!) 109  Resp: (!) $RemoveB'21 18  18  'kCEvUozj$ Temp:  98.2 F (36.8 C)  98 F (36.7 C)  TempSrc:  Oral  Oral  SpO2: 95% 94%  99%  Weight:      Height:  Intake/Output Summary (Last 24 hours) at 12/24/2020 0842 Last data filed at 12/23/2020 1800 Gross per 24 hour  Intake 1130.75 ml  Output --  Net 1130.75 ml   Weight change: -5.7 kg Exam:   General:  Pt is alert, follows commands appropriately, not in acute distress  HEENT: No icterus, No thrush, No neck mass, [place]/AT  Cardiovascular: RRR, S1/S2, no rubs, no  gallops  Respiratory: CTA bilaterally, no wheezing, no crackles, no rhonchi  Abdomen: Soft/+BS, non tender, non distended, no guarding  Extremities: No edema, No lymphangitis, No petechiae, No rashes, no synovitis   Data Reviewed: I have personally reviewed following labs and imaging studies Basic Metabolic Panel: Recent Labs  Lab 12/23/20 0325 12/23/20 0549 12/24/20 0607  NA 128*  --  129*  K >7.5* 5.7* 4.4  CL 94*  --  94*  CO2 21*  --  25  GLUCOSE 287*  --  96  BUN 31*  --  44*  CREATININE 3.69*  --  2.48*  CALCIUM 9.8  --  9.0  MG 1.9  --   --   PHOS 4.1  --   --    Liver Function Tests: Recent Labs  Lab 12/23/20 0325  AST 44*  ALT 35  ALKPHOS 131*  BILITOT 1.5*  PROT 9.2*  ALBUMIN 4.6   No results for input(s): LIPASE, AMYLASE in the last 168 hours. No results for input(s): AMMONIA in the last 168 hours. Coagulation Profile: No results for input(s): INR, PROTIME in the last 168 hours. CBC: Recent Labs  Lab 12/23/20 0325 12/24/20 0607  WBC 9.3 7.0  NEUTROABS 7.0  --   HGB 17.6* 15.7  HCT 52.7* 47.2  MCV 99.8 99.6  PLT 142* 90*   Cardiac Enzymes: No results for input(s): CKTOTAL, CKMB, CKMBINDEX, TROPONINI in the last 168 hours. BNP: Invalid input(s): POCBNP CBG: Recent Labs  Lab 12/23/20 1148 12/23/20 1623 12/23/20 2110  GLUCAP 183* 132* 115*   HbA1C: Recent Labs    12/23/20 0325  HGBA1C 9.1*   Urine analysis:    Component Value Date/Time   COLORURINE YELLOW 12/23/2020 0258   APPEARANCEUR CLEAR 12/23/2020 0258   APPEARANCEUR Turbid (A) 09/14/2018 1514   LABSPEC >1.030 (H) 12/23/2020 0258   PHURINE 5.0 12/23/2020 0258   GLUCOSEU NEGATIVE 12/23/2020 0258   HGBUR NEGATIVE 12/23/2020 0258   BILIRUBINUR NEGATIVE 12/23/2020 0258   BILIRUBINUR Negative 09/14/2018 1514   KETONESUR NEGATIVE 12/23/2020 0258   PROTEINUR NEGATIVE 12/23/2020 0258   NITRITE NEGATIVE 12/23/2020 0258   LEUKOCYTESUR NEGATIVE 12/23/2020 0258   Sepsis  Labs: $Remo'[id number]$ (procalcitonin:4,lacticidven:4) ) Recent Results (from the past 240 hour(s))  Resp Panel by RT-PCR (Flu A&B, Covid) Nasopharyngeal Swab     Status: None   Collection Time: 12/23/20  6:33 AM   Specimen: Nasopharyngeal Swab; Nasopharyngeal(NP) swabs in vial transport medium  Result Value Ref Range Status   SARS Coronavirus 2 by RT PCR NEGATIVE NEGATIVE Final    Comment: (NOTE) SARS-CoV-2 target nucleic acids are NOT DETECTED.  The SARS-CoV-2 RNA is generally detectable in upper respiratory specimens during the acute phase of infection. The lowest concentration of SARS-CoV-2 viral copies this assay can detect is 138 copies/mL. A negative result does not preclude SARS-Cov-2 infection and should not be used as the sole basis for treatment or other patient management decisions. A negative result may occur with  improper specimen collection/handling, submission of specimen other than nasopharyngeal swab, presence of viral mutation(s) within the areas targeted by this assay, and  inadequate number of viral copies(<138 copies/mL). A negative result must be combined with clinical observations, patient history, and epidemiological information. The expected result is Negative.  Fact Sheet for Patients:  EntrepreneurPulse.com.au  Fact Sheet for Healthcare Providers:  IncredibleEmployment.be  This test is no t yet approved or cleared by the [place] FDA and  has been authorized for detection and/or diagnosis of SARS-CoV-2 by FDA under an Emergency Use Authorization (EUA). This EUA will remain  in effect (meaning this test can be used) for the duration of the COVID-19 declaration under Section 564(b)(1) of the Act, 21 U.S.C.section 360bbb-3(b)(1), unless the authorization is terminated  or revoked sooner.       Influenza A by PCR NEGATIVE NEGATIVE Final   Influenza B by PCR NEGATIVE NEGATIVE Final    Comment: (NOTE) The Xpert Xpress  SARS-CoV-2/FLU/RSV plus assay is intended as an aid in the diagnosis of influenza from Nasopharyngeal swab specimens and should not be used as a sole basis for treatment. Nasal washings and aspirates are unacceptable for Xpert Xpress SARS-CoV-2/FLU/RSV testing.  Fact Sheet for Patients: EntrepreneurPulse.com.au  Fact Sheet for Healthcare Providers: IncredibleEmployment.be  This test is not yet approved or cleared by the [place] FDA and has been authorized for detection and/or diagnosis of SARS-CoV-2 by FDA under an Emergency Use Authorization (EUA). This EUA will remain in effect (meaning this test can be used) for the duration of the COVID-19 declaration under Section 564(b)(1) of the Act, 21 U.S.C. section 360bbb-3(b)(1), unless the authorization is terminated or revoked.  Performed at [facility], [address] [place], [place] [address]   MRSA PCR Screening     Status: None   Collection Time: 12/23/20  8:03 AM   Specimen: Nasal Mucosa; Nasopharyngeal  Result Value Ref Range Status   MRSA by PCR NEGATIVE NEGATIVE Final    Comment:        The GeneXpert MRSA Assay (FDA approved for NASAL specimens only), is one component of a comprehensive MRSA colonization surveillance program. It is not intended to diagnose MRSA infection nor to guide or monitor treatment for MRSA infections. Performed at [facility], [address] [place], [place] [address]      Scheduled Meds: . Chlorhexidine Gluconate Cloth  6 each Topical Daily  . ferrous sulfate  325 mg Oral BID WC  . heparin  5,000 Units Subcutaneous Q8H  . insulin aspart  0-5 Units Subcutaneous QHS  . insulin aspart  0-9 Units Subcutaneous TID WC  . levothyroxine  50 mcg Oral QAC breakfast  . loperamide  4 mg Oral Q6H   Continuous Infusions:  Procedures/Studies: CT Abdomen Pelvis W Contrast  Result Date: 11/26/2020 CLINICAL DATA:  Follow-up colorectal carcinoma. Previous  surgery, chemotherapy, and radiation therapy. Surveillance. EXAM: CT ABDOMEN AND PELVIS WITH CONTRAST TECHNIQUE: Multidetector CT imaging of the abdomen and pelvis was performed using the standard protocol following bolus administration of intravenous contrast. CONTRAST:  118mL OMNIPAQUE IOHEXOL 300 MG/ML  SOLN COMPARISON:  04/21/2020 FINDINGS: Lower Chest: No acute findings. Hepatobiliary: No hepatic masses identified. Prior cholecystectomy. No evidence of biliary obstruction. Pancreas:  No mass or inflammatory changes. Spleen: Within normal limits in size and appearance. Adrenals/Urinary Tract: No masses identified. No evidence of ureteral calculi or hydronephrosis. 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 Ill-defined soft tissue density in the presacral region remains stable, consistent with post treatment changes. No other soft tissue masses identified. No evidence of bowel obstruction, inflammatory process, or abnormal fluid collections.  Vascular/Lymphatic: No pathologically enlarged lymph nodes. No acute vascular findings. Aortic atherosclerotic calcification noted. Reproductive:  No mass or other significant abnormality. Other:  None. Musculoskeletal:  No suspicious bone lesions identified. IMPRESSION: Stable post treatment changes in presacral region. No evidence of recurrent or metastatic carcinoma, or other acute findings. Aortic Atherosclerosis (ICD10-I70.0). Electronically Signed   By: Marlaine Hind M.D.   On: 11/26/2020 11:02   US RENAL  Result Date: 12/23/2020 CLINICAL DATA:  Acute kidney injury. EXAM: RENAL / URINARY TRACT ULTRASOUND COMPLETE COMPARISON:  CT abdomen 11/26/2020 FINDINGS: Right Kidney: Renal measurements: 11.9 x 6.1 x 5.7 cm = volume: 218 mL. Echogenicity within normal limits. No mass or hydronephrosis visualized. Left Kidney: Renal measurements: 11.3 x 6.2 x 5.2 cm = volume: 191 mL. Echogenicity within normal limits. No mass or  hydronephrosis visualized. Bladder: Appears normal for degree of bladder distention. Other: None. IMPRESSION: Normal renal ultrasound. Electronically Signed   By: Markus Daft M.D.   On: 12/23/2020 13:27    Orson Eva, DO  [facility]  If 7PM-7AM, please contact night-coverage [email or web address] Password [email or web address] 12/24/2020, 8:42 AM   LOS: 1 day

## 2020-12-25 DIAGNOSIS — E861 Hypovolemia: Secondary | ICD-10-CM

## 2020-12-25 DIAGNOSIS — R7401 Elevation of levels of liver transaminase levels: Secondary | ICD-10-CM

## 2020-12-25 DIAGNOSIS — I9589 Other hypotension: Secondary | ICD-10-CM

## 2020-12-25 LAB — CBC
HCT: 47.5 % (ref 39.0–52.0)
Hemoglobin: 16.5 g/dL (ref 13.0–17.0)
MCH: 33.6 pg (ref 26.0–34.0)
MCHC: 34.7 g/dL (ref 30.0–36.0)
MCV: 96.7 fL (ref 80.0–100.0)
Platelets: 95 10*3/uL — ABNORMAL LOW (ref 150–400)
RBC: 4.91 MIL/uL (ref 4.22–5.81)
RDW: 12.8 % (ref 11.5–15.5)
WBC: 5.6 10*3/uL (ref 4.0–10.5)
nRBC: 0 % (ref 0.0–0.2)

## 2020-12-25 LAB — OSMOLALITY, URINE: Osmolality, Ur: 619 mOsm/kg (ref 300–900)

## 2020-12-25 LAB — COMPREHENSIVE METABOLIC PANEL
ALT: 29 U/L (ref 0–44)
AST: 40 U/L (ref 15–41)
Albumin: 3.7 g/dL (ref 3.5–5.0)
Alkaline Phosphatase: 100 U/L (ref 38–126)
Anion gap: 10 (ref 5–15)
BUN: 57 mg/dL — ABNORMAL HIGH (ref 8–23)
CO2: 21 mmol/L — ABNORMAL LOW (ref 22–32)
Calcium: 9 mg/dL (ref 8.9–10.3)
Chloride: 95 mmol/L — ABNORMAL LOW (ref 98–111)
Creatinine, Ser: 2.46 mg/dL — ABNORMAL HIGH (ref 0.61–1.24)
GFR, Estimated: 26 mL/min — ABNORMAL LOW (ref >60)
Glucose, Bld: 146 mg/dL — ABNORMAL HIGH (ref 70–99)
Potassium: 4.9 mmol/L (ref 3.5–5.1)
Sodium: 126 mmol/L — ABNORMAL LOW (ref 135–145)
Total Bilirubin: 1.2 mg/dL (ref 0.3–1.2)
Total Protein: 7.5 g/dL (ref 6.5–8.1)

## 2020-12-25 LAB — GLUCOSE, CAPILLARY
Glucose-Capillary: 129 mg/dL — ABNORMAL HIGH (ref 70–99)
Glucose-Capillary: 242 mg/dL — ABNORMAL HIGH (ref 70–99)
Glucose-Capillary: 252 mg/dL — ABNORMAL HIGH (ref 70–99)
Glucose-Capillary: 226 mg/dL — ABNORMAL HIGH (ref 70–99)

## 2020-12-25 LAB — TSH: TSH: 2.149 u[IU]/mL (ref 0.350–4.500)

## 2020-12-25 LAB — CREATININE, URINE, RANDOM: Creatinine, Urine: 335.6 mg/dL

## 2020-12-25 LAB — OSMOLALITY: Osmolality: 301 mOsm/kg — ABNORMAL HIGH (ref 275–295)

## 2020-12-25 LAB — SODIUM, URINE, RANDOM: Sodium, Ur: <10 mmol/L

## 2020-12-25 MED ORDER — SODIUM CHLORIDE 0.9 % IV SOLN: INTRAVENOUS | Status: DC

## 2020-12-25 NOTE — Progress Notes (Signed)
PROGRESS NOTE  Jose Lawrence MOL:[id number] DOB: 1941-06-17 DOA: 12/23/2020 PCP: Glenda Chroman, MD   Brief History:  80 year old male with a history of atrial fibrillation, hypertension, CKD stage III, colon adenocarcinoma status post total colectomy, diabetes mellitus type 2, BPH, hypothyroidism, post-operative PE 2017 presenting with 1-2 day history of nausea, vomiting, and increased ileostomy output.  He had denied any fevers, chills, chest pain, shortness of breath, abdominal pain, hematochezia, melena.  He has some generalized weakness.  He denies any new medications, but he states that he takes care of his grandchildren who have had a similar presentation with vomiting and diarrhea approximately 1 week prior to his presentation to the hospital.  In addition, the patient states that he recently ate at a church picnic on 12/21/2020. In the emergency department, the patient was afebrile with soft blood pressures with systolic BP in the 20F.  Oxygen saturation was 97% on room air.  BMP showed a sodium of 128 and serum creatinine 3.69.  AST 44, ALT 45, alk phosphatase 131, total bilirubin 1.5.  WBC 9.3, hemoglobin 17.6, platelets 142,000.  The patient was started on IV fluids.  He states that he has been taking his Imodium, 4 mg twice daily.  Assessment/Plan: Acute on chronic renal failure--CKD stage IIIa -Baseline creatinine 1.1-1.4 -Serum creatinine peaked 3.69>>2.48>>2.46 -Secondary to volume depletion and hemodynamic changes -Continue IV fluids -renal US--no hydronephrosis  Hyponatremia -Secondary to volume depletion -Continue normal saline -urine Na -urine creatinine -serum osm -urine osm -TSH  Lactic acidosis -Secondary to volume depletion -Sepsis ruled out -Lactic acid peaked at 2.7  Permanent atrial fibrillation -Not on anticoagulation due to recurrent anemia -rate controlled -continue metoprolol tartrate  Colon adenocarcinoma stage IIb -Status post  total colectomy/LAR 2008 -Patient follows Dr. Delton Coombes  Thrombocytopenia -This appears to be chronic -Patient follows Dr. Delton Coombes -No splenomegaly on recent CT abd/pelvis  Diabetes mellitus type 2, uncontrolled with hyperglycemia -Continue NovoLog sliding scale -Holding glipizide -12/23/20 A1c--9.1  Hypothyroidism -Continue Synthroid  Essential hypertension -Continue metoprolol tartrate  BPH -Continue Flomax  Hyponatremia -due to volume depletion -continue IVF  Hyperkalemia -improved     Status is: Inpatient  Remains inpatient appropriate because:IV treatments appropriate due to intensity of illness or inability to take PO   Dispo: The patient is from: Home  Anticipated d/c is to: Home  Patient currently is not medically stable to d/c.              Difficult to place patient No        Family Communication:   spouse updated 5/19 at bedside  Consultants:  none  Code Status:  FULL  DVT Prophylaxis:  [place] Heparin    Procedures: As Listed in Progress Note Above  Antibiotics: None   Subjective: Patient denies fevers, chills, headache, chest pain, dyspnea, nausea, vomiting, diarrhea, abdominal pain, dysuria, hematuria, hematochezia, and melena.   Objective: Vitals:   12/24/20 0607 12/24/20 0800 12/24/20 1338 12/25/20 0527  BP: 98/60 115/83 107/73 110/80  Pulse:  (!) 109 66 75  Resp:  _0 Temp:  98 F (36.7 C) 98.1 F (36.7 C) 97.8 F (36.6 C)  TempSrc:  Oral Oral Oral  SpO2:  99% 97% 96%  Weight:      Height:        Intake/Output Summary (Last 24 hours) at 12/25/2020 1155 Last data filed at 12/25/2020 0900 Gross per 24 hour  Intake 780.83 ml  Output --  Net 780.83 ml   Weight change:  Exam:   General:  Pt is alert, follows commands appropriately, not in acute distress  HEENT: No icterus, No thrush, No neck mass, [place]/AT  Cardiovascular: RRR, S1/S2, no rubs, no  gallops  Respiratory: CTA bilaterally, no wheezing, no crackles, no rhonchi  Abdomen: Soft/+BS, non tender, non distended, no guarding  Extremities: No edema, No lymphangitis, No petechiae, No rashes, no synovitis   Data Reviewed: I have personally reviewed following labs and imaging studies Basic Metabolic Panel: Recent Labs  Lab 12/23/20 0325 12/23/20 0549 12/24/20 0607 12/25/20 0624  NA 128*  --  129* 126*  K >7.5* 5.7* 4.4 4.9  CL 94*  --  94* 95*  CO2 21*  --  25 21*  GLUCOSE 287*  --  96 146*  BUN 31*  --  44* 57*  CREATININE 3.69*  --  2.48* 2.46*  CALCIUM 9.8  --  9.0 9.0  MG 1.9  --   --   --   PHOS 4.1  --   --   --    Liver Function Tests: Recent Labs  Lab 12/23/20 0325 12/25/20 0624  AST 44* 40  ALT 35 29  ALKPHOS 131* 100  BILITOT 1.5* 1.2  PROT 9.2* 7.5  ALBUMIN 4.6 3.7   No results for input(s): LIPASE, AMYLASE in the last 168 hours. No results for input(s): AMMONIA in the last 168 hours. Coagulation Profile: No results for input(s): INR, PROTIME in the last 168 hours. CBC: Recent Labs  Lab 12/23/20 0325 12/24/20 0607 12/25/20 0624  WBC 9.3 7.0 5.6  NEUTROABS 7.0  --   --   HGB 17.6* 15.7 16.5  HCT 52.7* 47.2 47.5  MCV 99.8 99.6 96.7  PLT 142* 90* 95*   Cardiac Enzymes: No results for input(s): CKTOTAL, CKMB, CKMBINDEX, TROPONINI in the last 168 hours. BNP: Invalid input(s): POCBNP CBG: Recent Labs  Lab 12/24/20 1126 12/24/20 1618 12/24/20 2135 12/25/20 0722 12/25/20 1116  GLUCAP 154* 146* 166* 129* 242*   HbA1C: Recent Labs    12/23/20 0325  HGBA1C 9.1*   Urine analysis:    Component Value Date/Time   COLORURINE YELLOW 12/23/2020 0258   APPEARANCEUR CLEAR 12/23/2020 0258   APPEARANCEUR Turbid (A) 09/14/2018 1514   LABSPEC >1.030 (H) 12/23/2020 0258   PHURINE 5.0 12/23/2020 0258   GLUCOSEU NEGATIVE 12/23/2020 0258   HGBUR NEGATIVE 12/23/2020 0258   BILIRUBINUR NEGATIVE 12/23/2020 0258   BILIRUBINUR Negative  09/14/2018 1514   KETONESUR NEGATIVE 12/23/2020 0258   PROTEINUR NEGATIVE 12/23/2020 0258   NITRITE NEGATIVE 12/23/2020 0258   LEUKOCYTESUR NEGATIVE 12/23/2020 0258   Sepsis Labs: _0 (procalcitonin:4,lacticidven:4) ) Recent Results (from the past 240 hour(s))  Resp Panel by RT-PCR (Flu A&B, Covid) Nasopharyngeal Swab     Status: None   Collection Time: 12/23/20  6:33 AM   Specimen: Nasopharyngeal Swab; Nasopharyngeal(NP) swabs in vial transport medium  Result Value Ref Range Status   SARS Coronavirus 2 by RT PCR NEGATIVE NEGATIVE Final    Comment: (NOTE) SARS-CoV-2 target nucleic acids are NOT DETECTED.  The SARS-CoV-2 RNA is generally detectable in upper respiratory specimens during the acute phase of infection. The lowest concentration of SARS-CoV-2 viral copies this assay can detect is 138 copies/mL. A negative result does not preclude SARS-Cov-2 infection and should not be used as the sole basis for treatment or other patient management decisions. A negative result may occur with  improper specimen collection/handling, submission of specimen other than nasopharyngeal  swab, presence of viral mutation(s) within the areas targeted by this assay, and inadequate number of viral copies(<138 copies/mL). A negative result must be combined with clinical observations, patient history, and epidemiological information. The expected result is Negative.  Fact Sheet for Patients:  EntrepreneurPulse.com.au  Fact Sheet for Healthcare Providers:  IncredibleEmployment.be  This test is no t yet approved or cleared by the [place] FDA and  has been authorized for detection and/or diagnosis of SARS-CoV-2 by FDA under an Emergency Use Authorization (EUA). This EUA will remain  in effect (meaning this test can be used) for the duration of the COVID-19 declaration under Section 564(b)(1) of the Act, 21 U.S.C.section 360bbb-3(b)(1), unless the  authorization is terminated  or revoked sooner.       Influenza A by PCR NEGATIVE NEGATIVE Final   Influenza B by PCR NEGATIVE NEGATIVE Final    Comment: (NOTE) The Xpert Xpress SARS-CoV-2/FLU/RSV plus assay is intended as an aid in the diagnosis of influenza from Nasopharyngeal swab specimens and should not be used as a sole basis for treatment. Nasal washings and aspirates are unacceptable for Xpert Xpress SARS-CoV-2/FLU/RSV testing.  Fact Sheet for Patients: EntrepreneurPulse.com.au  Fact Sheet for Healthcare Providers: IncredibleEmployment.be  This test is not yet approved or cleared by the [place] FDA and has been authorized for detection and/or diagnosis of SARS-CoV-2 by FDA under an Emergency Use Authorization (EUA). This EUA will remain in effect (meaning this test can be used) for the duration of the COVID-19 declaration under Section 564(b)(1) of the Act, 21 U.S.C. section 360bbb-3(b)(1), unless the authorization is terminated or revoked.  Performed at [facility], [address], [place], [place] [address]   MRSA PCR Screening     Status: None   Collection Time: 12/23/20  8:03 AM   Specimen: Nasal Mucosa; Nasopharyngeal  Result Value Ref Range Status   MRSA by PCR NEGATIVE NEGATIVE Final    Comment:        The GeneXpert MRSA Assay (FDA approved for NASAL specimens only), is one component of a comprehensive MRSA colonization surveillance program. It is not intended to diagnose MRSA infection nor to guide or monitor treatment for MRSA infections. Performed at [facility], [address] [place], [place] [address]      Scheduled Meds: . Chlorhexidine Gluconate Cloth  6 each Topical Daily  . ferrous sulfate  325 mg Oral BID WC  . insulin aspart  0-9 Units Subcutaneous TID WC  . levothyroxine  50 mcg Oral QAC breakfast  . loperamide  4 mg Oral Q6H  . metoprolol tartrate  12.5 mg Oral BID   Continuous  Infusions:  Procedures/Studies: CT Abdomen Pelvis W Contrast  Result Date: 11/26/2020 CLINICAL DATA:  Follow-up colorectal carcinoma. Previous surgery, chemotherapy, and radiation therapy. Surveillance. EXAM: CT ABDOMEN AND PELVIS WITH CONTRAST TECHNIQUE: Multidetector CT imaging of the abdomen and pelvis was performed using the standard protocol following bolus administration of intravenous contrast. CONTRAST:  133m OMNIPAQUE IOHEXOL 300 MG/ML  SOLN COMPARISON:  04/21/2020 FINDINGS: Lower Chest: No acute findings. Hepatobiliary: No hepatic masses identified. Prior cholecystectomy. No evidence of biliary obstruction. Pancreas:  No mass or inflammatory changes. Spleen: Within normal limits in size and appearance. Adrenals/Urinary Tract: No masses identified. No evidence of ureteral calculi or hydronephrosis. 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 Ill-defined soft tissue density in the presacral region remains stable, consistent with post treatment changes. No other soft tissue masses identified. No evidence of bowel obstruction, inflammatory  process, or abnormal fluid collections. Vascular/Lymphatic: No pathologically enlarged lymph nodes. No acute vascular findings. Aortic atherosclerotic calcification noted. Reproductive:  No mass or other significant abnormality. Other:  None. Musculoskeletal:  No suspicious bone lesions identified. IMPRESSION: Stable post treatment changes in presacral region. No evidence of recurrent or metastatic carcinoma, or other acute findings. Aortic Atherosclerosis (ICD10-I70.0). Electronically Signed   By: Marlaine Hind M.D.   On: 11/26/2020 11:02   US RENAL  Result Date: 12/23/2020 CLINICAL DATA:  Acute kidney injury. EXAM: RENAL / URINARY TRACT ULTRASOUND COMPLETE COMPARISON:  CT abdomen 11/26/2020 FINDINGS: Right Kidney: Renal measurements: 11.9 x 6.1 x 5.7 cm = volume: 218 mL. Echogenicity within normal limits. No mass or  hydronephrosis visualized. Left Kidney: Renal measurements: 11.3 x 6.2 x 5.2 cm = volume: 191 mL. Echogenicity within normal limits. No mass or hydronephrosis visualized. Bladder: Appears normal for degree of bladder distention. Other: None. IMPRESSION: Normal renal ultrasound. Electronically Signed   By: Markus Daft M.D.   On: 12/23/2020 13:27    Orson Eva, DO  [facility]  If 7PM-7AM, please contact night-coverage [email or web address] Password [email or web address] 12/25/2020, 11:55 AM   LOS: 2 days

## 2020-12-26 LAB — BASIC METABOLIC PANEL
Anion gap: 10 (ref 5–15)
BUN: 66 mg/dL — ABNORMAL HIGH (ref 8–23)
CO2: 22 mmol/L (ref 22–32)
Calcium: 8.9 mg/dL (ref 8.9–10.3)
Chloride: 95 mmol/L — ABNORMAL LOW (ref 98–111)
Creatinine, Ser: 2.75 mg/dL — ABNORMAL HIGH (ref 0.61–1.24)
GFR, Estimated: 23 mL/min — ABNORMAL LOW (ref >60)
Glucose, Bld: 198 mg/dL — ABNORMAL HIGH (ref 70–99)
Potassium: 5.3 mmol/L — ABNORMAL HIGH (ref 3.5–5.1)
Sodium: 127 mmol/L — ABNORMAL LOW (ref 135–145)

## 2020-12-26 LAB — GLUCOSE, CAPILLARY
Glucose-Capillary: 190 mg/dL — ABNORMAL HIGH (ref 70–99)
Glucose-Capillary: 235 mg/dL — ABNORMAL HIGH (ref 70–99)
Glucose-Capillary: 192 mg/dL — ABNORMAL HIGH (ref 70–99)
Glucose-Capillary: 125 mg/dL — ABNORMAL HIGH (ref 70–99)

## 2020-12-26 MED ORDER — INSULIN ASPART 100 UNIT/ML IJ SOLN
0.0000 [IU] | Freq: Three times a day (TID) | INTRAMUSCULAR | Status: DC
  Administered 2020-12-26: 3 [IU] via SUBCUTANEOUS
  Administered 2020-12-27: 8 [IU] via SUBCUTANEOUS
  Administered 2020-12-27: 2 [IU] via SUBCUTANEOUS

## 2020-12-26 MED ORDER — SODIUM CHLORIDE 0.9 % IV SOLN: INTRAVENOUS | Status: DC

## 2020-12-26 MED ORDER — INSULIN ASPART 100 UNIT/ML IJ SOLN
3.0000 [IU] | Freq: Three times a day (TID) | INTRAMUSCULAR | Status: DC
  Administered 2020-12-26 – 2020-12-27 (×3): 3 [IU] via SUBCUTANEOUS

## 2020-12-26 NOTE — Progress Notes (Signed)
PROGRESS NOTE  Jose Lawrence FDV:[id number] DOB: Apr 07, 1941 DOA: 12/23/2020 PCP: Glenda Chroman, MD    Brief History: 80 year old male with a history of atrial fibrillation, hypertension, CKD stage III, colon adenocarcinoma status post total colectomy, diabetes mellitus type 2, BPH, hypothyroidism, post-operative PE 2017presenting with 1-2day history of nausea, vomiting, and increased ileostomy output.He had denied any fevers, chills, chest pain, shortness of breath, abdominal pain, hematochezia, melena. He has some generalized weakness. He denies any new medications, but he states that he takes care of his grandchildren who have had a similar presentation with vomiting and diarrhea approximately 1 week prior to his presentation to the hospital. In addition, the patient states that he recently ate at a church picnic on 12/21/2020. In the emergency department, the patient was afebrile with soft blood pressures with systolic BP in the 99Y. Oxygen saturation was 97% on room air. BMP showed a sodium of 128 and serum creatinine 3.69. AST 44, ALT 45, alk phosphatase 131, total bilirubin 1.5. WBC 9.3, hemoglobin 17.6, platelets 142,000. The patient was started on IV fluids. He states that he has been taking his Imodium, 4 mg twice daily.  Assessment/Plan: Acute on chronic renal failure--CKD stage IIIa -Baseline creatinine 1.1-1.4 -Serum creatinine peaked 3.69>>2.48>>2.46>>2.75 -Secondary to volume depletion and hemodynamic changes -Continue IV fluids -renal US--no hydronephrosis -12/26/20--renal consulted>>continue supportive care  Hyponatremia -Secondary to volume depletion -Continue normal saline -urine Na <10 -serum osm 301 -urine osm 619 -TSH--2.149 -likely due to AKI  Lactic acidosis -Secondary to volume depletion -Sepsis ruled out -Lactic acid peaked at 2.7  Permanent atrial fibrillation -Not on anticoagulation due to recurrent anemia -rate  controlled -continue metoprolol tartrate  Colon adenocarcinoma stage IIb -Status post total colectomy/LAR 2008 -Patient follows Dr. Delton Coombes  Thrombocytopenia -This appears to be chronic -Patient follows Dr. Delton Coombes -No splenomegaly on recent CT abd/pelvis  Diabetes mellitus type 2, uncontrolled with hyperglycemia -Continue NovoLog sliding scale -Holding glipizide -12/23/20 A1c--9.1  Hypothyroidism -Continue Synthroid  Essential hypertension -Continue metoprolol tartrate  BPH -Continue Flomax  Hyperkalemia -anticipate improvement with IVF     Status is: Inpatient  Remains inpatient appropriate because:IV treatments appropriate due to intensity of illness or inability to take PO   Dispo: The patient is from:Home Anticipated d/c is XA:JLUN Patient currently is not medically stable to d/c. Difficult to place patient No        Family Communication:spouse updated 5/20 at bedside  Consultants:none  Code Status: FULL  DVT Prophylaxis: [place] Heparin    Procedures: As Listed in Progress Note Above    Subjective: Patient denies fevers, chills, headache, chest pain, dyspnea, nausea, vomiting, diarrhea, abdominal pain, dysuria, hematuria, hematochezia, and melena.   Objective: Vitals:   12/25/20 1407 12/25/20 2148 12/26/20 0430 12/26/20 0834  BP: 114/72 119/85 103/77 117/78  Pulse: 81 80 71 81  Resp:  17 17   Temp:  97.9 F (36.6 C) 97.6 F (36.4 C)   TempSrc:  Oral    SpO2: 99%  97%   Weight:      Height:        Intake/Output Summary (Last 24 hours) at 12/26/2020 1426 Last data filed at 12/26/2020 0900 Gross per 24 hour  Intake 480 ml  Output --  Net 480 ml   Weight change:  Exam:   General:  Pt is alert, follows commands appropriately, not in acute distress  HEENT: No icterus, No thrush, No neck mass, [place]/AT  Cardiovascular: RRR, S1/S2,  no rubs, no  gallops  Respiratory: CTA bilaterally, no wheezing, no crackles, no rhonchi  Abdomen: Soft/+BS, non tender, non distended, no guarding  Extremities: No edema, No lymphangitis, No petechiae, No rashes, no synovitis   Data Reviewed: I have personally reviewed following labs and imaging studies Basic Metabolic Panel: Recent Labs  Lab 12/23/20 0325 12/23/20 0549 12/24/20 0607 12/25/20 0624 12/26/20 0422  NA 128*  --  129* 126* 127*  K >7.5* 5.7* 4.4 4.9 5.3*  CL 94*  --  94* 95* 95*  CO2 21*  --  25 21* 22  GLUCOSE 287*  --  96 146* 198*  BUN 31*  --  44* 57* 66*  CREATININE 3.69*  --  2.48* 2.46* 2.75*  CALCIUM 9.8  --  9.0 9.0 8.9  MG 1.9  --   --   --   --   PHOS 4.1  --   --   --   --    Liver Function Tests: Recent Labs  Lab 12/23/20 0325 12/25/20 0624  AST 44* 40  ALT 35 29  ALKPHOS 131* 100  BILITOT 1.5* 1.2  PROT 9.2* 7.5  ALBUMIN 4.6 3.7   No results for input(s): LIPASE, AMYLASE in the last 168 hours. No results for input(s): AMMONIA in the last 168 hours. Coagulation Profile: No results for input(s): INR, PROTIME in the last 168 hours. CBC: Recent Labs  Lab 12/23/20 0325 12/24/20 0607 12/25/20 0624  WBC 9.3 7.0 5.6  NEUTROABS 7.0  --   --   HGB 17.6* 15.7 16.5  HCT 52.7* 47.2 47.5  MCV 99.8 99.6 96.7  PLT 142* 90* 95*   Cardiac Enzymes: No results for input(s): CKTOTAL, CKMB, CKMBINDEX, TROPONINI in the last 168 hours. BNP: Invalid input(s): POCBNP CBG: Recent Labs  Lab 12/25/20 1116 12/25/20 1624 12/25/20 2144 12/26/20 0737 12/26/20 1112  GLUCAP 242* 252* 226* 190* 235*   HbA1C: No results for input(s): HGBA1C in the last 72 hours. Urine analysis:    Component Value Date/Time   COLORURINE YELLOW 12/23/2020 0258   APPEARANCEUR CLEAR 12/23/2020 0258   APPEARANCEUR Turbid (A) 09/14/2018 1514   LABSPEC >1.030 (H) 12/23/2020 0258   PHURINE 5.0 12/23/2020 0258   GLUCOSEU NEGATIVE 12/23/2020 0258   HGBUR NEGATIVE 12/23/2020 0258    BILIRUBINUR NEGATIVE 12/23/2020 0258   BILIRUBINUR Negative 09/14/2018 1514   KETONESUR NEGATIVE 12/23/2020 0258   PROTEINUR NEGATIVE 12/23/2020 0258   NITRITE NEGATIVE 12/23/2020 0258   LEUKOCYTESUR NEGATIVE 12/23/2020 0258   Sepsis Labs: _0 (procalcitonin:4,lacticidven:4) ) Recent Results (from the past 240 hour(s))  Resp Panel by RT-PCR (Flu A&B, Covid) Nasopharyngeal Swab     Status: None   Collection Time: 12/23/20  6:33 AM   Specimen: Nasopharyngeal Swab; Nasopharyngeal(NP) swabs in vial transport medium  Result Value Ref Range Status   SARS Coronavirus 2 by RT PCR NEGATIVE NEGATIVE Final    Comment: (NOTE) SARS-CoV-2 target nucleic acids are NOT DETECTED.  The SARS-CoV-2 RNA is generally detectable in upper respiratory specimens during the acute phase of infection. The lowest concentration of SARS-CoV-2 viral copies this assay can detect is 138 copies/mL. A negative result does not preclude SARS-Cov-2 infection and should not be used as the sole basis for treatment or other patient management decisions. A negative result may occur with  improper specimen collection/handling, submission of specimen other than nasopharyngeal swab, presence of viral mutation(s) within the areas targeted by this assay, and inadequate number of viral copies(<138 copies/mL). A negative result must  be combined with clinical observations, patient history, and epidemiological information. The expected result is Negative.  Fact Sheet for Patients:  EntrepreneurPulse.com.au  Fact Sheet for Healthcare Providers:  IncredibleEmployment.be  This test is no t yet approved or cleared by the [place] FDA and  has been authorized for detection and/or diagnosis of SARS-CoV-2 by FDA under an Emergency Use Authorization (EUA). This EUA will remain  in effect (meaning this test can be used) for the duration of the COVID-19 declaration under Section 564(b)(1) of  the Act, 21 U.S.C.section 360bbb-3(b)(1), unless the authorization is terminated  or revoked sooner.       Influenza A by PCR NEGATIVE NEGATIVE Final   Influenza B by PCR NEGATIVE NEGATIVE Final    Comment: (NOTE) The Xpert Xpress SARS-CoV-2/FLU/RSV plus assay is intended as an aid in the diagnosis of influenza from Nasopharyngeal swab specimens and should not be used as a sole basis for treatment. Nasal washings and aspirates are unacceptable for Xpert Xpress SARS-CoV-2/FLU/RSV testing.  Fact Sheet for Patients: EntrepreneurPulse.com.au  Fact Sheet for Healthcare Providers: IncredibleEmployment.be  This test is not yet approved or cleared by the [place] FDA and has been authorized for detection and/or diagnosis of SARS-CoV-2 by FDA under an Emergency Use Authorization (EUA). This EUA will remain in effect (meaning this test can be used) for the duration of the COVID-19 declaration under Section 564(b)(1) of the Act, 21 U.S.C. section 360bbb-3(b)(1), unless the authorization is terminated or revoked.  Performed at [facility], [address], [place], [place] [address]   MRSA PCR Screening     Status: None   Collection Time: 12/23/20  8:03 AM   Specimen: Nasal Mucosa; Nasopharyngeal  Result Value Ref Range Status   MRSA by PCR NEGATIVE NEGATIVE Final    Comment:        The GeneXpert MRSA Assay (FDA approved for NASAL specimens only), is one component of a comprehensive MRSA colonization surveillance program. It is not intended to diagnose MRSA infection nor to guide or monitor treatment for MRSA infections. Performed at [facility] [address] [place], [place] [address]      Scheduled Meds: . Chlorhexidine Gluconate Cloth  6 each Topical Daily  . ferrous sulfate  325 mg Oral BID WC  . insulin aspart  0-9 Units Subcutaneous TID WC  . levothyroxine  50 mcg Oral QAC breakfast  . loperamide  4 mg Oral Q6H  . metoprolol  tartrate  12.5 mg Oral BID   Continuous Infusions:  Procedures/Studies: US RENAL  Result Date: 12/23/2020 CLINICAL DATA:  Acute kidney injury. EXAM: RENAL / URINARY TRACT ULTRASOUND COMPLETE COMPARISON:  CT abdomen 11/26/2020 FINDINGS: Right Kidney: Renal measurements: 11.9 x 6.1 x 5.7 cm = volume: 218 mL. Echogenicity within normal limits. No mass or hydronephrosis visualized. Left Kidney: Renal measurements: 11.3 x 6.2 x 5.2 cm = volume: 191 mL. Echogenicity within normal limits. No mass or hydronephrosis visualized. Bladder: Appears normal for degree of bladder distention. Other: None. IMPRESSION: Normal renal ultrasound. Electronically Signed   By: Markus Daft M.D.   On: 12/23/2020 13:27    Orson Eva, DO  [facility]  If 7PM-7AM, please contact night-coverage [email or web address] Password [email or web address] 12/26/2020, 2:26 PM   LOS: 3 days

## 2020-12-26 NOTE — Consult Note (Signed)
[place] Admit Date: 12/23/2020 12/26/2020 Rexene Agent Requesting Physician:  Tat MD  Reason for Consult:  AKI HPI:  67M presented to the ED on 5/17 with weakness, vomiting, increase in ileostomy output, decreased urine output.  Found to have acute kidney injury, severe hyperkalemia.  Etiology was felt to be from hypovolemia while continuing to use his ACE inhibitor for hypertension.  Patient was hydrated, received medical therapy for hyperkalemia, and his creatinine improved to low value of 2.46 yesterday.  His potassium had normalized.  Today his creatinine increased to 2.75, with a potassium of 5.3.  He has had mild hyponatremia and today the value is 127.    His ostomy output has normalized.  Subjectively, he has resumed d normal urine output but this is not quantified.  Earlier today his IV fluids were stopped.  He tolerated a full breakfast earlier today without nausea or vomiting.  Yesterday his urine osmolality was 619 and his urine sodium was less than 10.  On 5/17 he had a renal ultrasound with no acute obstructive or structural findings and normal-appearing kidneys.  Urine analysis at the time of presentation was without hematuria or proteinuria.  He denies use of NSAIDs prior to admission.  Yesterday on 5/19 he had a urine sodium of less than 10 and a urine osmolality of 619.  PMH Incudes:  Atrial fibrillation  Hypertension on ACE inhibitor  Baseline creatinine around 1.1, as per trend below  BPH  DM2  Hypothyroidism  History of colorectal cancer status post total colectomy with ileostomy, underwent chemotherapy, under observation with Dr. Delton Coombes   Creatinine, Ser (mg/dL)  Date Value  12/26/2020 2.75 (H)  12/25/2020 2.46 (H)  12/24/2020 2.48 (H)  12/23/2020 3.69 (H)  11/24/2020 1.13  09/01/2020 1.22  08/22/2020 1.46 (H)  04/21/2020 1.24  01/16/2020 1.24  10/08/2019 1.06  ] I/Os: I/O last 3 completed shifts: In: 71 [P.O.:720] Out: -     ROS Balance of 12 systems is negative w/ exceptions as above  PMH  Past Medical History:  Diagnosis Date  . Anemia    Causing interruption in anticoagulation  . Atrial fibrillation and flutter ([place])   . BPH (benign prostatic hypertrophy)   . Colon cancer ([place])    Status post LAR 2008  . Hypothyroidism   . Lacunar infarction ([place]) 11/2012   RT 3rd NERVE PALSY, SB [name]  . PE (pulmonary embolism)    Temporarily on Eliquis 2017  . Skin cancer    35 years ago  . Type 2 diabetes mellitus ([place])   . Vitamin B 12 deficiency 07/10/2019   PSH  Past Surgical History:  Procedure Laterality Date  . ABDOMINOPERINEAL PROCTOCOLECTOMY  2009   end colostomy   . BIOPSY  08/25/2018   Procedure: BIOPSY;  Surgeon: Rogene Houston, MD;  Location: [facility];  Service: Endoscopy;;  . BLADDER REPAIR  08/29/2018   Procedure: BLADDER REPAIR;  Surgeon: [name], MD;  Location: [facility] ORS;  Service: General;;  . COLECTOMY  2008  . COLONOSCOPY WITH PROPOFOL N/A 08/25/2018   Procedure: COLONOSCOPY WITH PROPOFOL;  Surgeon: Rogene Houston, MD;  Location: [facility];  Service: Endoscopy;  Laterality: N/A;  2:20  . COLOSTOMY    . EXPLORATORY LAPAROTOMY  2017   ? pneumoperitoneum of unknown etiology/ negative Ex lap  . ILEOSTOMY  08/29/2018   Procedure: ILEOSTOMY;  Surgeon: [name], MD;  Location: [facility];  Service: General;;  . LAPAROSCOPIC CHOLECYSTECTOMY  2007  . LYSIS OF ADHESION  08/29/2018   Procedure: LYSIS OF ADHESION;  Surgeon: [name], MD;  Location: [facility];  Service: General;;  . PARTIAL COLECTOMY N/A 08/29/2018   Procedure: PARTIAL COLECTOMY;  Surgeon: [name], MD;  Location: [facility];  Service: General;  Laterality: N/A;  . PORTACATH PLACEMENT Left 10/04/2018   Procedure: INSERTION PORT-A-CATH (attached catheter left subclavian);  Surgeon: [name], MD;  Location: [facility];  Service: General;  Laterality: Left;  . SBO SURGERY  06/2009    FH  Family History  Problem Relation Age of Onset  . Heart disease Father   . Heart failure Father   . Heart attack Father   . Prostate cancer Paternal Uncle    SH  reports that he quit smoking about 29 years ago. His smoking use included cigarettes. He started smoking about 56 years ago. He has a 27.00 pack-year smoking history. He quit smokeless tobacco use about 25 years ago.  His smokeless tobacco use included chew. He reports that he does not drink alcohol and does not use drugs. Allergies No Known Allergies Home medications Prior to Admission medications   Medication Sig Start Date End Date Taking? Authorizing Provider  ergocalciferol (VITAMIN D2) 1.25 MG (50000 UT) capsule Take 1 capsule (50,000 Units total) by mouth once a week. 09/19/20  Yes Derek Jack, MD  ferrous sulfate (FERROUSUL) 325 (65 FE) MG tablet Take 1 tablet (325 mg total) by mouth 2 (two) times daily with a meal. 07/17/19  Yes Derek Jack, MD  glipiZIDE (GLUCOTROL) 10 MG tablet Take 10 mg by mouth 2 (two) times daily. 07/05/18  Yes [provider]  levothyroxine (SYNTHROID, LEVOTHROID) 50 MCG tablet Take 50 mcg by mouth daily before breakfast.   Yes [provider]  lisinopril (ZESTRIL) 10 MG tablet Take 10 mg by mouth daily. 02/26/19  Yes [provider]  loperamide (IMODIUM) 2 MG capsule Take 2 capsules (4 mg total) by mouth 4 (four) times daily -  before meals and at bedtime. 11/11/18  Yes [name], MD  metoprolol tartrate (LOPRESSOR) 25 MG tablet Take 1 tablet (25 mg total) by mouth 2 (two) times daily. 09/04/18  Yes Kathie Dike, MD  tamsulosin (FLOMAX) 0.4 MG CAPS capsule Take 0.4 mg by mouth at bedtime.    Yes [provider]  lidocaine-prilocaine (EMLA) cream Apply to affected area once Patient not taking: No sig reported 10/09/18   Derek Jack, MD    Current Medications Scheduled Meds: . Chlorhexidine Gluconate Cloth  6 each Topical  Daily  . ferrous sulfate  325 mg Oral BID WC  . insulin aspart  0-9 Units Subcutaneous TID WC  . levothyroxine  50 mcg Oral QAC breakfast  . loperamide  4 mg Oral Q6H  . metoprolol tartrate  12.5 mg Oral BID   Continuous Infusions: . sodium chloride 75 mL/hr at 12/25/20 2241   PRN Meds:.acetaminophen **OR** acetaminophen, ondansetron **OR** ondansetron (ZOFRAN) IV  CBC Recent Labs  Lab 12/23/20 0325 12/24/20 0607 12/25/20 0624  WBC 9.3 7.0 5.6  NEUTROABS 7.0  --   --   HGB 17.6* 15.7 16.5  HCT 52.7* 47.2 47.5  MCV 99.8 99.6 96.7  PLT 142* 90* 95*   Basic Metabolic Panel Recent Labs  Lab 12/23/20 0325 12/23/20 0549 12/24/20 0607 12/25/20 0624 12/26/20 0422  NA 128*  --  129* 126* 127*  K >7.5* 5.7* 4.4 4.9 5.3*  CL 94*  --  94*  95* 95*  CO2 21*  --  25 21* 22  GLUCOSE 287*  --  96 146* 198*  BUN 31*  --  44* 57* 66*  CREATININE 3.69*  --  2.48* 2.46* 2.75*  CALCIUM 9.8  --  9.0 9.0 8.9  PHOS 4.1  --   --   --   --     Physical Exam  Blood pressure 117/78, pulse 81, temperature 97.6 F (36.4 C), resp. rate 17, height 6' (1.829 m), weight 88.3 kg, SpO2 97 %. GEN: NAD, appears well, sitting in chair ENT: NCAT EYES: EOMI CV: Regular, normal S1 and S2 PULM: Clear bilaterally ABD: Soft, nontender SKIN: NCAT EXT: Nonfocal  Assessment 4M with hypovolemia from increased ostomy output/nausea/vomiting while using ACE inhibitor presenting with AKI with hyperkalemia now with sluggish resolution/GFR recovery.  I think he is received excellent care.  I suspect that his kidney function will recover in time.  I do not think that there is a more unusual etiology of his AKI at the current time.  There probably is some degree of ATN present.  I think we can continue oral hydration.  Staying off the ACE inhibitor will be important.  If renal function is stable or improved tomorrow I think it be okay for discharge and outpatient follow-up.  I suspect that his hyponatremia is  related to hypovolemia, interestingly his serum osmolality was normal when checked yesterday.  1. AKI, prerenal etiology potentially with some transition to ATN; imaging negative; UA unremarkable 2. Hyperkalemia, improved secondary to #1 and use of ACE inhibitor 3. Isotonic hyponatremia, I suspect this will improve in time 4. Recent hypovolemia 2/2 N/V and increased ostomy output, improved 5. Hypertension, BP stable off ACE inhibitor, would not resume 6. Permanent atrial fibrillation 7. History of colorectal cancer under observation with oncology 8. DM2 9. Hypothyroidism  Plan 1. As above, if renal function is stable or improved tomorrow okay to follow-up as outpt with PCP 2. Daily weights, Daily Renal Panel, Strict I/Os, Avoid nephrotoxins (NSAIDs, judicious IV Contrast)    Rexene Agent  12/26/2020, 11:52 AM

## 2020-12-26 NOTE — Progress Notes (Signed)
Inpatient Diabetes Program Recommendations  AACE/ADA: New Consensus Statement on Inpatient Glycemic Control (2015)  Target Ranges:  Prepandial:   less than 140 mg/dL      Peak postprandial:   less than 180 mg/dL (1-2 hours)      Critically ill patients:  140 - 180 mg/dL   Lab Results  Component Value Date   GLUCAP 226 (H) 12/25/2020   HGBA1C 9.1 (H) 12/23/2020    Review of Glycemic Control Results for Jose Lawrence, Jose Lawrence (MRN [id number]) as of 12/26/2020 07:31  Ref. Range 12/25/2020 07:22 12/25/2020 11:16 12/25/2020 16:24 12/25/2020 21:44  Glucose-Capillary Latest Ref Range: 70 - 99 mg/dL 129 (H) 242 (H) 252 (H) 226 (H)   Diabetes history:  DM2 Outpatient Diabetes medications:  Glipizide 10 mg BID Current orders for Inpatient glycemic control:  Novolog 0-9 units TID  Inpatient Diabetes Program Recommendations:     Novolog 3 units TID with meals if eats at least 50%.  Will continue to follow while inpatient.  Thank you, Reche Dixon, RN, BSN Diabetes Coordinator Inpatient Diabetes Program [phone] (team pager from 8a-5p)

## 2020-12-27 LAB — GLUCOSE, CAPILLARY
Glucose-Capillary: 139 mg/dL — ABNORMAL HIGH (ref 70–99)
Glucose-Capillary: 269 mg/dL — ABNORMAL HIGH (ref 70–99)

## 2020-12-27 LAB — BASIC METABOLIC PANEL
Anion gap: 6 (ref 5–15)
BUN: 54 mg/dL — ABNORMAL HIGH (ref 8–23)
CO2: 22 mmol/L (ref 22–32)
Calcium: 8.4 mg/dL — ABNORMAL LOW (ref 8.9–10.3)
Chloride: 100 mmol/L (ref 98–111)
Creatinine, Ser: 2.07 mg/dL — ABNORMAL HIGH (ref 0.61–1.24)
GFR, Estimated: 32 mL/min — ABNORMAL LOW (ref >60)
Glucose, Bld: 131 mg/dL — ABNORMAL HIGH (ref 70–99)
Potassium: 4.1 mmol/L (ref 3.5–5.1)
Sodium: 128 mmol/L — ABNORMAL LOW (ref 135–145)

## 2020-12-27 NOTE — Discharge Summary (Signed)
Physician Discharge Summary  Jose Lawrence MRN:[id number] DOB: 04/25/1941 DOA: 12/23/2020  PCP: Vyas, Dhruv B, MD  Admit date: 12/23/2020 Discharge date: 12/27/2020  Admitted From: Home Disposition:  Home  Recommendations for Outpatient Follow-up:  1. Follow up with PCP in 1-2 weeks 2. Please obtain BMP/CBC in one week   Discharge Condition: Stable CODE STATUS: FULL Diet recommendation: Heart Healthy / Carb Modified    Brief/Interim Summary: 80-year-old male with a history of atrial fibrillation, hypertension, CKD stage III, colon adenocarcinoma status post total colectomy, diabetes mellitus type 2, BPH, hypothyroidism, post-operative PE 2017presenting with 1-2day history of nausea, vomiting, and increased ileostomy output.He had denied any fevers, chills, chest pain, shortness of breath, abdominal pain, hematochezia, melena. He has some generalized weakness. He denies any new medications, but he states that he takes care of his grandchildren who have had a similar presentation with vomiting and diarrhea approximately 1 week prior to his presentation to the hospital. In addition, the patient states that he recently ate at a church picnic on 12/21/2020. In the emergency department, the patient was afebrile with soft blood pressures with systolic BP in the 90s. Oxygen saturation was 97% on room air. BMP showed a sodium of 128 and serum creatinine 3.69. AST 44, ALT 45, alk phosphatase 131, total bilirubin 1.5. WBC 9.3, hemoglobin 17.6, platelets 142,000. The patient was started on IV fluids. He states that he has been taking his Imodium, 4 mg twice daily.  Discharge Diagnoses:  Acute on chronic renal failure--CKD stage IIIa -Baseline creatinine 1.1-1.4 -Serum creatinine peaked 3.69>>2.48>>2.46>>2.75>>2.07 -Secondary to volume depletion and hemodynamic changes -Continue IV fluids -renal US--no hydronephrosis -12/26/20--renal consulted>>continue supportive care -will not  restart lisinopril-->BP remains well controlled -12/27/20--cleared for d/c  Hyponatremia -Secondary to volume depletion -Continue normal saline -urine Na <10 -serum osm 301 -urine osm 619 -TSH--2.149 -due to AKI -improving at time of d/c--128  Lactic acidosis -Secondary to volume depletion -Sepsis ruled out -Lactic acid peaked at 2.7  Permanent atrial fibrillation -Not on anticoagulation due to recurrent anemia -rate controlled -continue metoprolol tartrate  Colon adenocarcinoma stage IIb -Status post total colectomy/LAR 2008 -Patient follows Dr. Katragadda  Thrombocytopenia -This appears to be chronic -Patient follows Dr. Katragadda -No splenomegaly on recent CT abd/pelvis  Diabetes mellitus type 2, uncontrolled with hyperglycemia -Continue NovoLog sliding scale -Holding glipizide -12/23/20 A1c--9.1  Hypothyroidism -Continue Synthroid  Essential hypertension -Continue metoprolol tartrate -d/c lisinopril  BPH -Continue Flomax  Hyperkalemia -anticipate improvement with IVF -resolved  High output ileostomy -discussed with surgery -continue loperamide 2 tabs four times daily     Discharge Instructions   Allergies as of 12/27/2020   No Known Allergies     Medication List    STOP taking these medications   lidocaine-prilocaine cream Commonly known as: EMLA   lisinopril 10 MG tablet Commonly known as: ZESTRIL     TAKE these medications   ergocalciferol 1.25 MG (50000 UT) capsule Commonly known as: VITAMIN D2 Take 1 capsule (50,000 Units total) by mouth once a week.   ferrous sulfate 325 (65 FE) MG tablet Commonly known as: FerrouSul Take 1 tablet (325 mg total) by mouth 2 (two) times daily with a meal.   glipiZIDE 10 MG tablet Commonly known as: GLUCOTROL Take 10 mg by mouth 2 (two) times daily.   levothyroxine 50 MCG tablet Commonly known as: SYNTHROID Take 50 mcg by mouth daily before breakfast.   loperamide 2 MG  capsule Commonly known as: IMODIUM Take 2 capsules (4 mg total) by   mouth 4 (four) times daily -  before meals and at bedtime.   metoprolol tartrate 25 MG tablet Commonly known as: LOPRESSOR Take 1 tablet (25 mg total) by mouth 2 (two) times daily.   tamsulosin 0.4 MG Caps capsule Commonly known as: FLOMAX Take 0.4 mg by mouth at bedtime.       No Known Allergies  Consultations:  renal   Procedures/Studies: US RENAL  Result Date: 12/23/2020 CLINICAL DATA:  Acute kidney injury. EXAM: RENAL / URINARY TRACT ULTRASOUND COMPLETE COMPARISON:  CT abdomen 11/26/2020 FINDINGS: Right Kidney: Renal measurements: 11.9 x 6.1 x 5.7 cm = volume: 218 mL. Echogenicity within normal limits. No mass or hydronephrosis visualized. Left Kidney: Renal measurements: 11.3 x 6.2 x 5.2 cm = volume: 191 mL. Echogenicity within normal limits. No mass or hydronephrosis visualized. Bladder: Appears normal for degree of bladder distention. Other: None. IMPRESSION: Normal renal ultrasound. Electronically Signed   By: Markus Daft M.D.   On: 12/23/2020 13:27         Discharge Exam: Vitals:   12/27/20 0411 12/27/20 0841  BP: 108/63 132/83  Pulse: (!) 53 62  Resp: 15   Temp: (!) 97.5 F (36.4 C)   SpO2: 97%    Vitals:   12/26/20 1504 12/26/20 2112 12/27/20 0411 12/27/20 0841  BP: 111/79 120/75 108/63 132/83  Pulse: 62 (!) 52 (!) 53 62  Resp: _0 Temp: 97.8 F (36.6 C) 98 F (36.7 C) (!) 97.5 F (36.4 C)   TempSrc:  Oral Oral   SpO2: 99% 98% 97%   Weight:      Height:        General: Pt is alert, awake, not in acute distress Cardiovascular: RRR, S1/S2 +, no rubs, no gallops Respiratory: CTA bilaterally, no wheezing, no rhonchi Abdominal: Soft, NT, ND, bowel sounds + Extremities: no edema, no cyanosis   The results of significant diagnostics from this hospitalization (including imaging, microbiology, ancillary and laboratory) are listed below for reference.    Significant Diagnostic  Studies: US RENAL  Result Date: 12/23/2020 CLINICAL DATA:  Acute kidney injury. EXAM: RENAL / URINARY TRACT ULTRASOUND COMPLETE COMPARISON:  CT abdomen 11/26/2020 FINDINGS: Right Kidney: Renal measurements: 11.9 x 6.1 x 5.7 cm = volume: 218 mL. Echogenicity within normal limits. No mass or hydronephrosis visualized. Left Kidney: Renal measurements: 11.3 x 6.2 x 5.2 cm = volume: 191 mL. Echogenicity within normal limits. No mass or hydronephrosis visualized. Bladder: Appears normal for degree of bladder distention. Other: None. IMPRESSION: Normal renal ultrasound. Electronically Signed   By: Markus Daft M.D.   On: 12/23/2020 13:27     Microbiology: Recent Results (from the past 240 hour(s))  Resp Panel by RT-PCR (Flu A&B, Covid) Nasopharyngeal Swab     Status: None   Collection Time: 12/23/20  6:33 AM   Specimen: Nasopharyngeal Swab; Nasopharyngeal(NP) swabs in vial transport medium  Result Value Ref Range Status   SARS Coronavirus 2 by RT PCR NEGATIVE NEGATIVE Final    Comment: (NOTE) SARS-CoV-2 target nucleic acids are NOT DETECTED.  The SARS-CoV-2 RNA is generally detectable in upper respiratory specimens during the acute phase of infection. The lowest concentration of SARS-CoV-2 viral copies this assay can detect is 138 copies/mL. A negative result does not preclude SARS-Cov-2 infection and should not be used as the sole basis for treatment or other patient management decisions. A negative result may occur with  improper specimen collection/handling, submission of specimen other than nasopharyngeal swab, presence of viral  mutation(s) within the areas targeted by this assay, and inadequate number of viral copies(<138 copies/mL). A negative result must be combined with clinical observations, patient history, and epidemiological information. The expected result is Negative.  Fact Sheet for Patients:  EntrepreneurPulse.com.au  Fact Sheet for Healthcare Providers:   IncredibleEmployment.be  This test is no t yet approved or cleared by the [place] FDA and  has been authorized for detection and/or diagnosis of SARS-CoV-2 by FDA under an Emergency Use Authorization (EUA). This EUA will remain  in effect (meaning this test can be used) for the duration of the COVID-19 declaration under Section 564(b)(1) of the Act, 21 U.S.C.section 360bbb-3(b)(1), unless the authorization is terminated  or revoked sooner.       Influenza A by PCR NEGATIVE NEGATIVE Final   Influenza B by PCR NEGATIVE NEGATIVE Final    Comment: (NOTE) The Xpert Xpress SARS-CoV-2/FLU/RSV plus assay is intended as an aid in the diagnosis of influenza from Nasopharyngeal swab specimens and should not be used as a sole basis for treatment. Nasal washings and aspirates are unacceptable for Xpert Xpress SARS-CoV-2/FLU/RSV testing.  Fact Sheet for Patients: EntrepreneurPulse.com.au  Fact Sheet for Healthcare Providers: IncredibleEmployment.be  This test is not yet approved or cleared by the [place] FDA and has been authorized for detection and/or diagnosis of SARS-CoV-2 by FDA under an Emergency Use Authorization (EUA). This EUA will remain in effect (meaning this test can be used) for the duration of the COVID-19 declaration under Section 564(b)(1) of the Act, 21 U.S.C. section 360bbb-3(b)(1), unless the authorization is terminated or revoked.  Performed at [facility], [address], [place], [place] [address]   MRSA PCR Screening     Status: None   Collection Time: 12/23/20  8:03 AM   Specimen: Nasal Mucosa; Nasopharyngeal  Result Value Ref Range Status   MRSA by PCR NEGATIVE NEGATIVE Final    Comment:        The GeneXpert MRSA Assay (FDA approved for NASAL specimens only), is one component of a comprehensive MRSA colonization surveillance program. It is not intended to diagnose MRSA infection nor to guide  or monitor treatment for MRSA infections. Performed at [facility], [address], [place], [place] [address]      Labs: Basic Metabolic Panel: Recent Labs  Lab 12/23/20 0325 12/23/20 0549 12/24/20 0607 12/25/20 0624 12/26/20 0422 12/27/20 0416  NA 128*  --  129* 126* 127* 128*  K >7.5*   < > 4.4 4.9 5.3* 4.1  CL 94*  --  94* 95* 95* 100  CO2 21*  --  25 21* 22 22  GLUCOSE 287*  --  96 146* 198* 131*  BUN 31*  --  44* 57* 66* 54*  CREATININE 3.69*  --  2.48* 2.46* 2.75* 2.07*  CALCIUM 9.8  --  9.0 9.0 8.9 8.4*  MG 1.9  --   --   --   --   --   PHOS 4.1  --   --   --   --   --    < > = values in this interval not displayed.   Liver Function Tests: Recent Labs  Lab 12/23/20 0325 12/25/20 0624  AST 44* 40  ALT 35 29  ALKPHOS 131* 100  BILITOT 1.5* 1.2  PROT 9.2* 7.5  ALBUMIN 4.6 3.7   No results for input(s): LIPASE, AMYLASE in the last 168 hours. No results for input(s): AMMONIA in the last 168 hours. CBC: Recent Labs  Lab 12/23/20 0325 12/24/20 0607 12/25/20 0624  WBC 9.3 7.0 5.6  NEUTROABS 7.0  --   --   HGB 17.6* 15.7 16.5  HCT 52.7* 47.2 47.5  MCV 99.8 99.6 96.7  PLT 142* 90* 95*   Cardiac Enzymes: No results for input(s): CKTOTAL, CKMB, CKMBINDEX, TROPONINI in the last 168 hours. BNP: Invalid input(s): POCBNP CBG: Recent Labs  Lab 12/26/20 1112 12/26/20 1633 12/26/20 2108 12/27/20 0740 12/27/20 1114  GLUCAP 235* 192* 125* 139* 269*    Time coordinating discharge:  36 minutes  Signed:  Orson Eva, DO [facility] Pager: [phone] 12/27/2020, 12:58 PM

## 2020-12-27 NOTE — Progress Notes (Signed)
Brief nephrology update note  Dr. Joelyn Oms saw patient yesterday for AKI likely ATN.  Creatinine significantly improved to 2 today.  Sodium slowly rising.  Likely resolving ATN.  We will sign off at this time.  Patient can follow-up with primary provider outpatient.  Stable for discharge from nephrology perspective.

## 2021-01-07 DIAGNOSIS — C189 Malignant neoplasm of colon, unspecified: Secondary | ICD-10-CM | POA: Diagnosis not present

## 2021-01-07 DIAGNOSIS — N189 Chronic kidney disease, unspecified: Secondary | ICD-10-CM | POA: Diagnosis not present

## 2021-01-07 DIAGNOSIS — I10 Essential (primary) hypertension: Secondary | ICD-10-CM | POA: Diagnosis not present

## 2021-01-07 DIAGNOSIS — Z299 Encounter for prophylactic measures, unspecified: Secondary | ICD-10-CM | POA: Diagnosis not present

## 2021-01-07 DIAGNOSIS — N179 Acute kidney failure, unspecified: Secondary | ICD-10-CM | POA: Diagnosis not present

## 2021-02-20 ENCOUNTER — Inpatient Hospital Stay (HOSPITAL_COMMUNITY): Payer: Medicare Other | Attending: Hematology

## 2021-02-20 ENCOUNTER — Other Ambulatory Visit: Payer: Self-pay

## 2021-02-20 DIAGNOSIS — E875 Hyperkalemia: Secondary | ICD-10-CM | POA: Insufficient documentation

## 2021-02-20 DIAGNOSIS — E538 Deficiency of other specified B group vitamins: Secondary | ICD-10-CM | POA: Insufficient documentation

## 2021-02-20 DIAGNOSIS — C184 Malignant neoplasm of transverse colon: Secondary | ICD-10-CM | POA: Insufficient documentation

## 2021-02-20 DIAGNOSIS — D696 Thrombocytopenia, unspecified: Secondary | ICD-10-CM | POA: Insufficient documentation

## 2021-02-20 DIAGNOSIS — D7589 Other specified diseases of blood and blood-forming organs: Secondary | ICD-10-CM | POA: Diagnosis not present

## 2021-02-20 DIAGNOSIS — Z933 Colostomy status: Secondary | ICD-10-CM | POA: Insufficient documentation

## 2021-02-20 DIAGNOSIS — K529 Noninfective gastroenteritis and colitis, unspecified: Secondary | ICD-10-CM | POA: Diagnosis not present

## 2021-02-20 LAB — CBC WITH DIFFERENTIAL/PLATELET
Abs Immature Granulocytes: 0.02 10*3/uL (ref 0.00–0.07)
Basophils Absolute: 0.1 10*3/uL (ref 0.0–0.1)
Basophils Relative: 1 %
Eosinophils Absolute: 0.2 10*3/uL (ref 0.0–0.5)
Eosinophils Relative: 5 %
HCT: 41.6 % (ref 39.0–52.0)
Hemoglobin: 14.1 g/dL (ref 13.0–17.0)
Immature Granulocytes: 1 %
Lymphocytes Relative: 25 %
Lymphs Abs: 1.1 10*3/uL (ref 0.7–4.0)
MCH: 34.9 pg — ABNORMAL HIGH (ref 26.0–34.0)
MCHC: 33.9 g/dL (ref 30.0–36.0)
MCV: 103 fL — ABNORMAL HIGH (ref 80.0–100.0)
Monocytes Absolute: 0.4 10*3/uL (ref 0.1–1.0)
Monocytes Relative: 10 %
Neutro Abs: 2.5 10*3/uL (ref 1.7–7.7)
Neutrophils Relative %: 58 %
Platelets: 95 10*3/uL — ABNORMAL LOW (ref 150–400)
RBC: 4.04 MIL/uL — ABNORMAL LOW (ref 4.22–5.81)
RDW: 13.5 % (ref 11.5–15.5)
WBC: 4.2 10*3/uL (ref 4.0–10.5)
nRBC: 0 % (ref 0.0–0.2)

## 2021-02-20 LAB — COMPREHENSIVE METABOLIC PANEL
ALT: 23 U/L (ref 0–44)
AST: 30 U/L (ref 15–41)
Albumin: 3.5 g/dL (ref 3.5–5.0)
Alkaline Phosphatase: 127 U/L — ABNORMAL HIGH (ref 38–126)
Anion gap: 6 (ref 5–15)
BUN: 11 mg/dL (ref 8–23)
CO2: 24 mmol/L (ref 22–32)
Calcium: 8.9 mg/dL (ref 8.9–10.3)
Chloride: 105 mmol/L (ref 98–111)
Creatinine, Ser: 1.09 mg/dL (ref 0.61–1.24)
GFR, Estimated: >60 mL/min (ref >60)
Glucose, Bld: 185 mg/dL — ABNORMAL HIGH (ref 70–99)
Potassium: 4.3 mmol/L (ref 3.5–5.1)
Sodium: 135 mmol/L (ref 135–145)
Total Bilirubin: 0.9 mg/dL (ref 0.3–1.2)
Total Protein: 6.8 g/dL (ref 6.5–8.1)

## 2021-02-21 LAB — CEA: CEA: 1.7 ng/mL (ref 0.0–4.7)

## 2021-02-23 ENCOUNTER — Inpatient Hospital Stay (HOSPITAL_COMMUNITY): Payer: Medicare Other

## 2021-03-02 ENCOUNTER — Ambulatory Visit (HOSPITAL_COMMUNITY): Payer: Medicare Other | Admitting: Hematology

## 2021-03-05 ENCOUNTER — Encounter (HOSPITAL_COMMUNITY): Payer: Self-pay | Admitting: Nurse Practitioner

## 2021-03-05 ENCOUNTER — Other Ambulatory Visit: Payer: Self-pay

## 2021-03-05 ENCOUNTER — Inpatient Hospital Stay (HOSPITAL_BASED_OUTPATIENT_CLINIC_OR_DEPARTMENT_OTHER): Payer: Medicare Other | Admitting: Nurse Practitioner

## 2021-03-05 VITALS — BP 137/61 | HR 96 | Temp 98.6°F | Resp 18 | Wt 202.8 lb

## 2021-03-05 DIAGNOSIS — E559 Vitamin D deficiency, unspecified: Secondary | ICD-10-CM

## 2021-03-05 DIAGNOSIS — C184 Malignant neoplasm of transverse colon: Secondary | ICD-10-CM | POA: Diagnosis not present

## 2021-03-05 DIAGNOSIS — D7589 Other specified diseases of blood and blood-forming organs: Secondary | ICD-10-CM | POA: Diagnosis not present

## 2021-03-05 DIAGNOSIS — E875 Hyperkalemia: Secondary | ICD-10-CM | POA: Diagnosis not present

## 2021-03-05 DIAGNOSIS — K529 Noninfective gastroenteritis and colitis, unspecified: Secondary | ICD-10-CM | POA: Diagnosis not present

## 2021-03-05 DIAGNOSIS — E538 Deficiency of other specified B group vitamins: Secondary | ICD-10-CM | POA: Diagnosis not present

## 2021-03-05 DIAGNOSIS — D696 Thrombocytopenia, unspecified: Secondary | ICD-10-CM | POA: Diagnosis not present

## 2021-03-05 NOTE — Progress Notes (Signed)
Jose Lawrence, [place] [address]   CLINIC:  Medical Oncology/Hematology  PCP:  Glenda Chroman, MD [address] / [place] [address] [phone]   REASON FOR VISIT:  Follow-up for transverse colon cancer  PRIOR THERAPY:  1. Colon resection with colostomy in 09/2007. 2. FOLFOX and radiation from 09/2007 through 02/2008 3. 12 cycles of infusional 5-FU and leucovorin from 10/09/2018 through 03/21/2019.  NGS Results: Foundation 1 MSI--stable  CURRENT THERAPY: Surveillance  BRIEF ONCOLOGIC HISTORY:  Oncology History  Cancer of transverse colon ([place])  09/23/2018 Initial Diagnosis   Cancer of transverse colon ([place])    09/29/2018 Cancer Staging   Staging form: Colon and Rectum, AJCC 8th Edition - Clinical stage from 09/29/2018: Stage IIB (cT4a, cN0, cM0) - Signed by Derek Jack, MD on 09/29/2018    10/09/2018 - 03/23/2019 Chemotherapy   The patient had ondansetron (ZOFRAN) 8 mg in sodium chloride 0.9 % 50 mL IVPB, 8 mg (100 % of original dose 8 mg), Intravenous,  Once, 12 of 12 cycles Dose modification: 8 mg (original dose 8 mg, Cycle 1) Administration: 8 mg (10/09/2018), 8 mg (10/23/2018), 8 mg (11/06/2018), 8 mg (11/20/2018), 8 mg (12/04/2018), 8 mg (12/18/2018), 8 mg (01/02/2019), 8 mg (01/15/2019), 8 mg (02/06/2019), 8 mg (02/21/2019), 8 mg (03/07/2019), 8 mg (03/21/2019) leucovorin 800 mg in dextrose 5 % 250 mL infusion, 820 mg, Intravenous,  Once, 12 of 12 cycles Administration: 800 mg (10/09/2018), 820 mg (10/23/2018), 820 mg (11/06/2018), 820 mg (11/20/2018), 820 mg (12/04/2018), 820 mg (12/18/2018), 820 mg (01/02/2019), 820 mg (01/15/2019), 820 mg (02/06/2019), 820 mg (02/21/2019), 820 mg (03/07/2019), 820 mg (03/21/2019) fluorouracil (ADRUCIL) chemo injection 800 mg, 400 mg/m2 = 800 mg, Intravenous,  Once, 12 of 12 cycles Administration: 800 mg (10/09/2018), 800 mg (10/23/2018), 800 mg (11/06/2018), 800 mg (11/20/2018), 800 mg (12/04/2018), 800 mg (12/18/2018), 800 mg  (01/02/2019), 800 mg (01/15/2019), 800 mg (02/06/2019), 800 mg (02/21/2019), 800 mg (03/07/2019), 800 mg (03/21/2019) fluorouracil (ADRUCIL) 4,900 mg in sodium chloride 0.9 % 52 mL chemo infusion, 2,400 mg/m2 = 4,900 mg, Intravenous, 1 Day/Dose, 12 of 12 cycles Administration: 4,900 mg (10/09/2018), 4,900 mg (10/23/2018), 5,000 mg (11/06/2018), 5,000 mg (11/20/2018), 5,000 mg (12/04/2018), 5,000 mg (12/18/2018), 5,000 mg (01/02/2019), 5,000 mg (01/15/2019), 5,000 mg (02/06/2019), 5,000 mg (02/21/2019), 5,000 mg (03/07/2019), 5,000 mg (03/21/2019)   for chemotherapy treatment.       CANCER STAGING: Cancer Staging Cancer of transverse colon [facility]) Staging form: Colon and Rectum, AJCC 8th Edition - Clinical stage from 09/29/2018: Stage IIB (cT4a, cN0, cM0) - Signed by Derek Jack, MD on 09/29/2018   INTERVAL HISTORY:  [name], a 80 y.o. 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  He feels well.  Denies having changes in his bowel movements or seeing blood in his colostomy bag.  Denies abnormal bleeding or bruising.  Rarely will see specks of red in his colostomy bag but unsure if this is food or blood.  Appetite is great and he denies unintentional weight loss.  No interval fevers or infections.  No chest pain or shortness of breath.  No nausea or vomiting.  No abdominal pain.  No urinary complaints.  No melena.    REVIEW OF SYSTEMS:  Review of Systems  Constitutional:  Negative for appetite change, fatigue and unexpected weight change.  HENT:   Negative for mouth sores,  sore throat and trouble swallowing.   Respiratory:  Negative for chest tightness and shortness of breath.   Cardiovascular:  Negative for leg swelling.  Gastrointestinal:  Positive for diarrhea (chronic; ostomy). Negative for abdominal pain, constipation, nausea and vomiting.  Genitourinary:  Negative for bladder  incontinence and dysuria.   Musculoskeletal:  Negative for flank pain and neck stiffness.  Skin:  Negative for itching, rash and wound.  Neurological:  Negative for dizziness, headaches, light-headedness and numbness.  Psychiatric/Behavioral:  Negative for confusion, depression and sleep disturbance. The patient is not nervous/anxious.    PAST MEDICAL/SURGICAL HISTORY:  Past Medical History:  Diagnosis Date   Anemia    Causing interruption in anticoagulation   Atrial fibrillation and flutter (HCC)    BPH (benign prostatic hypertrophy)    Colon cancer ([place])    Status post LAR 2008   Hypothyroidism    Lacunar infarction ([place]) 11/2012   RT 3rd NERVE PALSY, SB [name]   PE (pulmonary embolism)    Temporarily on Eliquis 2017   Skin cancer    35 years ago   Type 2 diabetes mellitus ([place])    Vitamin B 12 deficiency 07/10/2019   Past Surgical History:  Procedure Laterality Date   ABDOMINOPERINEAL PROCTOCOLECTOMY  2009   end colostomy    BIOPSY  08/25/2018   Procedure: BIOPSY;  Surgeon: Rogene Houston, MD;  [facility];  Service: Endoscopy;;   BLADDER REPAIR  08/29/2018   Procedure: BLADDER REPAIR;  Surgeon: [name], MD;  Location: [facility] ORS;  Service: General;;   COLECTOMY  2008   COLONOSCOPY WITH PROPOFOL N/A 08/25/2018   Procedure: COLONOSCOPY WITH PROPOFOL;  Surgeon: Rogene Houston, MD;  Location: [facility];  Service: Endoscopy;  Laterality: N/A;  2:20   COLOSTOMY     EXPLORATORY LAPAROTOMY  2017   ? pneumoperitoneum of unknown etiology/ negative Ex lap   ILEOSTOMY  08/29/2018   Procedure: ILEOSTOMY;  Surgeon: [name], MD;  Location: [facility] OR[facility];  Service: General;;   LAPAROSCOPIC CHOLECYSTECTOMY  2007   LYSIS OF ADHESION  08/29/2018   Procedure: LYSIS OF ADHESION;  Surgeon: [name], MD;  Location: [facility];  Service: General;;   PARTIAL COLECTOMY N/A 08/29/2018   Procedure: PARTIAL COLECTOMY;  Surgeon: [name], MD;  Location:  [facility];  Service: General;  Laterality: N/A;   PORTACATH PLACEMENT Left 10/04/2018   Procedure: INSERTION PORT-A-CATH (attached catheter left subclavian);  Surgeon: [name], MD;  Location: [facility];  Service: General;  Laterality: Left;   SBO SURGERY  06/2009    SOCIAL HISTORY:  Social History   Socioeconomic History   Marital status: Married    Spouse name: Not on file   Number of children: Not on file   Years of education: Not on file   Highest education level: Not on file  Occupational History   Occupation: Architect    Comment: [employer]  Tobacco Use   Smoking status: Former    Packs/day: 1.00    Years: 27.00    Pack years: 27.00    Types: Cigarettes    Start date: 01/27/1964    Quit date: 01/27/1991    Years since quitting: 30.1   Smokeless tobacco: Former    Types: Chew    Quit date: 08/10/1995  Vaping Use   Vaping Use: Never used  Substance and Sexual Activity   Alcohol use: Never    Alcohol/week: 0.0 standard drinks   Drug  use: Never   Sexual activity: Not Currently  Other Topics Concern   Not on file  Social History Narrative   Not on file   Social Determinants of Health   Financial Resource Strain: Low Risk    Difficulty of Paying Living Expenses: Not hard at all  Food Insecurity: No Food Insecurity   Worried About [place] in the Last Year: Never true   [place] in the Last Year: Never true  Transportation Needs: No Transportation Needs   Lack of Transportation (Medical): No   Lack of Transportation (Non-Medical): No  Physical Activity: Sufficiently Active   Days of Exercise per Week: 7 days   Minutes of Exercise per Session: 30 min  Stress: No Stress Concern Present   Feeling of Stress : Not at all  Social Connections: Socially Integrated   Frequency of Communication with Friends and Family: More than three times a week   Frequency of Social Gatherings with Friends and Family: More than three times a week   Attends  Religious Services: More than 4 times per year   Active Member of [employer] or Organizations: Yes   Attends Music therapist: More than 4 times per year   Marital Status: Married  Human resources officer Violence: Not At Risk   Fear of Current or Ex-Partner: No   Emotionally Abused: No   Physically Abused: No   Sexually Abused: No    FAMILY HISTORY:  Family History  Problem Relation Age of Onset   Heart disease Father    Heart failure Father    Heart attack Father    Prostate cancer Paternal Uncle     CURRENT MEDICATIONS:  Current Outpatient Medications  Medication Sig Dispense Refill   ergocalciferol (VITAMIN D2) 1.25 MG (50000 UT) capsule Take 1 capsule (50,000 Units total) by mouth once a week. 16 capsule 4   ferrous sulfate (FERROUSUL) 325 (65 FE) MG tablet Take 1 tablet (325 mg total) by mouth 2 (two) times daily with a meal. 60 tablet 3   glipiZIDE (GLUCOTROL) 10 MG tablet Take 10 mg by mouth 2 (two) times daily.     levothyroxine (SYNTHROID, LEVOTHROID) 50 MCG tablet Take 50 mcg by mouth daily before breakfast.     loperamide (IMODIUM) 2 MG capsule Take 2 capsules (4 mg total) by mouth 4 (four) times daily -  before meals and at bedtime. 240 capsule 3   metoprolol tartrate (LOPRESSOR) 25 MG tablet Take 1 tablet (25 mg total) by mouth 2 (two) times daily. 60 tablet 0   tamsulosin (FLOMAX) 0.4 MG CAPS capsule Take 0.4 mg by mouth at bedtime.      No current facility-administered medications for this visit.    ALLERGIES:  No Known Allergies  PHYSICAL EXAM:  Performance status (ECOG): 2 - Symptomatic, <50% confined to bed  Vitals:   03/05/21 1351  BP: 137/61  Pulse: 96  Resp: 18  Temp: 98.6 F (37 C)  SpO2: 97%   Wt Readings from Last 3 Encounters:  03/05/21 202 lb 12.8 oz (92 kg)  12/23/20 194 lb 10.7 oz (88.3 kg)  12/01/20 206 lb 8 oz (93.7 kg)   Physical Exam Constitutional:      General: He is not in acute distress.    Appearance: He is  well-developed.     Comments: accompanied  HENT:     Head: Normocephalic and atraumatic.     Mouth/Throat:     Pharynx: No oropharyngeal exudate.  Eyes:  General: No scleral icterus.    Conjunctiva/sclera: Conjunctivae normal.  Cardiovascular:     Rate and Rhythm: Normal rate and regular rhythm.  Pulmonary:     Effort: Pulmonary effort is normal.     Breath sounds: Normal breath sounds. No wheezing.  Abdominal:     General: Bowel sounds are normal. There is no distension.     Palpations: Abdomen is soft.     Tenderness: There is no abdominal tenderness.     Comments: Thin brown stool in ostomy bag  Musculoskeletal:        General: Normal range of motion.     Cervical back: Normal range of motion and neck supple.  Skin:    General: Skin is warm and dry.  Neurological:     Mental Status: He is alert and oriented to person, place, and time.  Psychiatric:        Behavior: Behavior normal.     LABORATORY DATA:  I have reviewed the labs as listed.  CBC Latest Ref Rng & Units 02/20/2021 12/25/2020 12/24/2020  WBC 4.0 - 10.5 K/uL 4.2 5.6 7.0  Hemoglobin 13.0 - 17.0 g/dL 14.1 16.5 15.7  Hematocrit 39.0 - 52.0 % 41.6 47.5 47.2  Platelets 150 - 400 K/uL 95(L) 95(L) 90(L)   CMP Latest Ref Rng & Units 02/20/2021 12/27/2020 12/26/2020  Glucose 70 - 99 mg/dL 185(H) 131(H) 198(H)  BUN 8 - 23 mg/dL 11 54(H) 66(H)  Creatinine 0.61 - 1.24 mg/dL 1.09 2.07(H) 2.75(H)  Sodium 135 - 145 mmol/L 135 128(L) 127(L)  Potassium 3.5 - 5.1 mmol/L 4.3 4.1 5.3(H)  Chloride 98 - 111 mmol/L 105 100 95(L)  CO2 22 - 32 mmol/L _0 Calcium 8.9 - 10.3 mg/dL 8.9 8.4(L) 8.9  Total Protein 6.5 - 8.1 g/dL 6.8 - -  Total Bilirubin 0.3 - 1.2 mg/dL 0.9 - -  Alkaline Phos 38 - 126 U/L 127(H) - -  AST 15 - 41 U/L 30 - -  ALT 0 - 44 U/L 23 - -   Lab Results  Component Value Date   CEA1 1.7 02/20/2021   CEA1 1.8 11/24/2020   CEA1 1.9 08/22/2020    DIAGNOSTIC IMAGING:  I have independently reviewed the  scans and discussed with the patient. No results found.    ASSESSMENT & PLAN:  1.  Stage IIb (T4N0) transverse colon adenocarcinoma, MSI stable: History of colon cancer in 2008 status postresection.  Did not receive adjuvant chemo.  Recurrence in February 2009 and underwent resection with colostomy followed by FOLFOX with radiation for 6 months.  Recurrence in 2020.  He received 12 cycles of adjuvant chemotherapy with infusional 5-FU/leucovorin from 10/09/2018-03/21/2019.  NED since.  CEA has been normal.  Recommend history and physical every 6 months.  Continue imaging yearly.  2.  B12 Deficiency: previously 1465 in 2021. On oral b12. Given macrocytosis, will recheck at next visit.   3.  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 Imodium as needed.    4.  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 Monitor.    5.  Vitamin D deficiency: normal in January 2022. Check annually.    6.  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 CT in April showed normal appearing spleen. No change in counts. Monitor.    7. Hyperkalemia/worsening renal function: improved. Cr and Potassium now normal. Monitor.   Disposition: 6 mo- lab (cbc, cmp, cea, b12, vit d), MD   Orders placed this encounter:  Orders Placed This Encounter  Procedures   CBC with  Differential/Platelet   Comprehensive metabolic panel   Vitamin K93   CEA   VITAMIN D 25 Hydroxy (Vit-D Deficiency, Fractures)   Beckey Rutter, DNP, AGNP-C

## 2021-03-16 DIAGNOSIS — Z299 Encounter for prophylactic measures, unspecified: Secondary | ICD-10-CM | POA: Diagnosis not present

## 2021-03-16 DIAGNOSIS — E1165 Type 2 diabetes mellitus with hyperglycemia: Secondary | ICD-10-CM | POA: Diagnosis not present

## 2021-03-16 DIAGNOSIS — I4891 Unspecified atrial fibrillation: Secondary | ICD-10-CM | POA: Diagnosis not present

## 2021-03-16 DIAGNOSIS — I700 Atherosclerosis of aorta: Secondary | ICD-10-CM | POA: Diagnosis not present

## 2021-03-16 DIAGNOSIS — I10 Essential (primary) hypertension: Secondary | ICD-10-CM | POA: Diagnosis not present

## 2021-03-25 DIAGNOSIS — Z299 Encounter for prophylactic measures, unspecified: Secondary | ICD-10-CM | POA: Diagnosis not present

## 2021-03-25 DIAGNOSIS — L570 Actinic keratosis: Secondary | ICD-10-CM | POA: Diagnosis not present

## 2021-03-25 DIAGNOSIS — I10 Essential (primary) hypertension: Secondary | ICD-10-CM | POA: Diagnosis not present

## 2021-06-01 ENCOUNTER — Encounter (HOSPITAL_COMMUNITY): Payer: Self-pay | Admitting: Hematology

## 2021-06-20 DIAGNOSIS — Z23 Encounter for immunization: Secondary | ICD-10-CM | POA: Diagnosis not present

## 2021-06-23 ENCOUNTER — Ambulatory Visit (INDEPENDENT_AMBULATORY_CARE_PROVIDER_SITE_OTHER): Payer: Medicare Other | Admitting: General Surgery

## 2021-06-23 ENCOUNTER — Other Ambulatory Visit: Payer: Self-pay

## 2021-06-23 ENCOUNTER — Encounter: Payer: Self-pay | Admitting: General Surgery

## 2021-06-23 VITALS — BP 135/75 | HR 55 | Temp 97.1°F | Resp 14 | Ht 72.0 in | Wt 200.0 lb

## 2021-06-23 DIAGNOSIS — E1165 Type 2 diabetes mellitus with hyperglycemia: Secondary | ICD-10-CM | POA: Diagnosis not present

## 2021-06-23 DIAGNOSIS — K929 Disease of digestive system, unspecified: Secondary | ICD-10-CM | POA: Diagnosis not present

## 2021-06-23 DIAGNOSIS — J40 Bronchitis, not specified as acute or chronic: Secondary | ICD-10-CM | POA: Diagnosis not present

## 2021-06-23 DIAGNOSIS — Z299 Encounter for prophylactic measures, unspecified: Secondary | ICD-10-CM | POA: Diagnosis not present

## 2021-06-23 DIAGNOSIS — R109 Unspecified abdominal pain: Secondary | ICD-10-CM | POA: Diagnosis not present

## 2021-06-23 DIAGNOSIS — K633 Ulcer of intestine: Secondary | ICD-10-CM | POA: Diagnosis not present

## 2021-06-23 DIAGNOSIS — I10 Essential (primary) hypertension: Secondary | ICD-10-CM | POA: Diagnosis not present

## 2021-06-23 NOTE — Patient Instructions (Addendum)
Continue ileostomy care. Likely is little benign stoma granulomas, biopsies sent to ensure nothing more concerning. Continue to hold pressure with minor bleeding as needed.   Will call with results.

## 2021-06-25 NOTE — Progress Notes (Signed)
[facility] Note   HPI:  80 y.o. Male presents to clinic for evaluation of his ileostomy. He has been doing well and feeling good. He has had the ileostomy now since 2020. He has had 2 prior colon cancers and had a completion colectomy and end ileostomy with me in 2020. He says that he has noticed new spots on his stoma that look like tomato seeds and they bleed some. Nothing major but he as worried about them. He does get fever blisters and his wife does too.   Review of Systems:  Minor stoma bleeding  All other review of systems: otherwise negative   Vital Signs:  BP 135/75   Pulse (!) 55   Temp (!) 97.1 F (36.2 C) (Other (Comment))   Resp 14   Ht 6' (1.829 m)   Wt 200 lb (90.7 kg)   SpO2 98%   BMI 27.12 kg/m    Physical Exam:  Physical Exam Cardiovascular:     Rate and Rhythm: Normal rate.  Pulmonary:     Effort: Pulmonary effort is normal.  Abdominal:     General: There is no distension.     Palpations: Abdomen is soft.     Tenderness: There is no abdominal tenderness.     Comments: Patent ileostomy digitized, nodules on the mucosa surface, < 81mm each, looks like stoma granulomas     Procedure: Excisional biopsy of mucosa of stoma Description: Using scissors a sharp excisional biopsy was done on the superficial mucosa of the stoma removing multiple nodules. Hemostasis was obtained with silvernitrate. A Stoma appliance was reapplied. The patient tolerated the procedure well.    Assessment:  80 y.o. yo Male with what looks like stoma granulomas but could be HSV given history of fever blisters. Pathology sent to see and to give the patient reassurance that this is not cancer.   Plan:  Continue ileostomy care. Likely is little benign stoma granulomas, biopsies sent to ensure nothing more concerning. Continue to hold pressure with minor bleeding as needed.   Will call with results.    Curlene Labrum, MD [facility] [address] [place], [place] [address] [phone] (office)

## 2021-06-26 LAB — PATHOLOGY REPORT

## 2021-06-26 LAB — TISSUE SPECIMEN

## 2021-06-28 ENCOUNTER — Telehealth (INDEPENDENT_AMBULATORY_CARE_PROVIDER_SITE_OTHER): Payer: Medicare Other | Admitting: General Surgery

## 2021-06-28 DIAGNOSIS — K929 Disease of digestive system, unspecified: Secondary | ICD-10-CM

## 2021-06-28 NOTE — Telephone Encounter (Signed)
[facility]  Pathology on stoma benign, granuloma changes.  Pathology: Benign intestinal mucosa with focal ulceration,  granulation tissue, and reactive glandular  changes.   Nothing concerning. Manage bleeding like he has been.  Curlene Labrum, MD [facility] [address] [place], [place] [address] [phone] (office)

## 2021-07-29 DIAGNOSIS — Z7189 Other specified counseling: Secondary | ICD-10-CM | POA: Diagnosis not present

## 2021-07-29 DIAGNOSIS — Z6827 Body mass index (BMI) 27.0-27.9, adult: Secondary | ICD-10-CM | POA: Diagnosis not present

## 2021-07-29 DIAGNOSIS — E039 Hypothyroidism, unspecified: Secondary | ICD-10-CM | POA: Diagnosis not present

## 2021-07-29 DIAGNOSIS — Z0000 Encounter for general adult medical examination without abnormal findings: Secondary | ICD-10-CM | POA: Diagnosis not present

## 2021-07-29 DIAGNOSIS — I10 Essential (primary) hypertension: Secondary | ICD-10-CM | POA: Diagnosis not present

## 2021-07-29 DIAGNOSIS — Z1331 Encounter for screening for depression: Secondary | ICD-10-CM | POA: Diagnosis not present

## 2021-07-29 DIAGNOSIS — R5383 Other fatigue: Secondary | ICD-10-CM | POA: Diagnosis not present

## 2021-07-29 DIAGNOSIS — Z299 Encounter for prophylactic measures, unspecified: Secondary | ICD-10-CM | POA: Diagnosis not present

## 2021-07-29 DIAGNOSIS — Z1339 Encounter for screening examination for other mental health and behavioral disorders: Secondary | ICD-10-CM | POA: Diagnosis not present

## 2021-07-29 DIAGNOSIS — E7800 Pure hypercholesterolemia, unspecified: Secondary | ICD-10-CM | POA: Diagnosis not present

## 2021-07-30 DIAGNOSIS — Z125 Encounter for screening for malignant neoplasm of prostate: Secondary | ICD-10-CM | POA: Diagnosis not present

## 2021-07-30 DIAGNOSIS — Z79899 Other long term (current) drug therapy: Secondary | ICD-10-CM | POA: Diagnosis not present

## 2021-07-30 DIAGNOSIS — R5383 Other fatigue: Secondary | ICD-10-CM | POA: Diagnosis not present

## 2021-07-30 DIAGNOSIS — E7800 Pure hypercholesterolemia, unspecified: Secondary | ICD-10-CM | POA: Diagnosis not present

## 2021-07-30 DIAGNOSIS — E039 Hypothyroidism, unspecified: Secondary | ICD-10-CM | POA: Diagnosis not present

## 2021-09-11 ENCOUNTER — Other Ambulatory Visit (HOSPITAL_COMMUNITY): Payer: Self-pay

## 2021-09-11 DIAGNOSIS — E559 Vitamin D deficiency, unspecified: Secondary | ICD-10-CM

## 2021-09-11 DIAGNOSIS — E538 Deficiency of other specified B group vitamins: Secondary | ICD-10-CM

## 2021-09-11 DIAGNOSIS — C184 Malignant neoplasm of transverse colon: Secondary | ICD-10-CM

## 2021-09-14 ENCOUNTER — Other Ambulatory Visit: Payer: Self-pay

## 2021-09-14 ENCOUNTER — Inpatient Hospital Stay (HOSPITAL_COMMUNITY): Payer: Medicare Other | Attending: Hematology

## 2021-09-14 DIAGNOSIS — D696 Thrombocytopenia, unspecified: Secondary | ICD-10-CM | POA: Diagnosis not present

## 2021-09-14 DIAGNOSIS — E538 Deficiency of other specified B group vitamins: Secondary | ICD-10-CM | POA: Diagnosis not present

## 2021-09-14 DIAGNOSIS — Z79899 Other long term (current) drug therapy: Secondary | ICD-10-CM | POA: Insufficient documentation

## 2021-09-14 DIAGNOSIS — Z85038 Personal history of other malignant neoplasm of large intestine: Secondary | ICD-10-CM | POA: Insufficient documentation

## 2021-09-14 DIAGNOSIS — E559 Vitamin D deficiency, unspecified: Secondary | ICD-10-CM | POA: Diagnosis not present

## 2021-09-14 DIAGNOSIS — Z9221 Personal history of antineoplastic chemotherapy: Secondary | ICD-10-CM | POA: Diagnosis not present

## 2021-09-14 DIAGNOSIS — C184 Malignant neoplasm of transverse colon: Secondary | ICD-10-CM

## 2021-09-14 LAB — CBC WITH DIFFERENTIAL/PLATELET
Abs Immature Granulocytes: 0.04 10*3/uL (ref 0.00–0.07)
Basophils Absolute: 0.1 10*3/uL (ref 0.0–0.1)
Basophils Relative: 1 %
Eosinophils Absolute: 0.1 10*3/uL (ref 0.0–0.5)
Eosinophils Relative: 1 %
HCT: 49.7 % (ref 39.0–52.0)
Hemoglobin: 17.3 g/dL — ABNORMAL HIGH (ref 13.0–17.0)
Immature Granulocytes: 1 %
Lymphocytes Relative: 12 %
Lymphs Abs: 0.9 10*3/uL (ref 0.7–4.0)
MCH: 34.7 pg — ABNORMAL HIGH (ref 26.0–34.0)
MCHC: 34.8 g/dL (ref 30.0–36.0)
MCV: 99.8 fL (ref 80.0–100.0)
Monocytes Absolute: 0.4 10*3/uL (ref 0.1–1.0)
Monocytes Relative: 5 %
Neutro Abs: 6.1 10*3/uL (ref 1.7–7.7)
Neutrophils Relative %: 80 %
Platelets: 101 10*3/uL — ABNORMAL LOW (ref 150–400)
RBC: 4.98 MIL/uL (ref 4.22–5.81)
RDW: 13.5 % (ref 11.5–15.5)
WBC: 7.6 10*3/uL (ref 4.0–10.5)
nRBC: 0 % (ref 0.0–0.2)

## 2021-09-14 LAB — VITAMIN B12: Vitamin B-12: 344 pg/mL (ref 180–914)

## 2021-09-14 LAB — COMPREHENSIVE METABOLIC PANEL
ALT: 30 U/L (ref 0–44)
AST: 42 U/L — ABNORMAL HIGH (ref 15–41)
Albumin: 3.8 g/dL (ref 3.5–5.0)
Alkaline Phosphatase: 163 U/L — ABNORMAL HIGH (ref 38–126)
Anion gap: 8 (ref 5–15)
BUN: 15 mg/dL (ref 8–23)
CO2: 25 mmol/L (ref 22–32)
Calcium: 9.4 mg/dL (ref 8.9–10.3)
Chloride: 100 mmol/L (ref 98–111)
Creatinine, Ser: 1.32 mg/dL — ABNORMAL HIGH (ref 0.61–1.24)
GFR, Estimated: 55 mL/min — ABNORMAL LOW (ref >60)
Glucose, Bld: 162 mg/dL — ABNORMAL HIGH (ref 70–99)
Potassium: 4.8 mmol/L (ref 3.5–5.1)
Sodium: 133 mmol/L — ABNORMAL LOW (ref 135–145)
Total Bilirubin: 2.3 mg/dL — ABNORMAL HIGH (ref 0.3–1.2)
Total Protein: 7.8 g/dL (ref 6.5–8.1)

## 2021-09-14 LAB — VITAMIN D 25 HYDROXY (VIT D DEFICIENCY, FRACTURES): Vit D, 25-Hydroxy: 36.59 ng/mL (ref 30–100)

## 2021-09-15 LAB — CEA: CEA: 2.5 ng/mL (ref 0.0–4.7)

## 2021-09-16 DIAGNOSIS — N390 Urinary tract infection, site not specified: Secondary | ICD-10-CM | POA: Diagnosis not present

## 2021-09-16 DIAGNOSIS — Z299 Encounter for prophylactic measures, unspecified: Secondary | ICD-10-CM | POA: Diagnosis not present

## 2021-09-16 DIAGNOSIS — I4891 Unspecified atrial fibrillation: Secondary | ICD-10-CM | POA: Diagnosis not present

## 2021-09-16 DIAGNOSIS — E1165 Type 2 diabetes mellitus with hyperglycemia: Secondary | ICD-10-CM | POA: Diagnosis not present

## 2021-09-16 DIAGNOSIS — I10 Essential (primary) hypertension: Secondary | ICD-10-CM | POA: Diagnosis not present

## 2021-09-21 ENCOUNTER — Other Ambulatory Visit: Payer: Self-pay

## 2021-09-21 ENCOUNTER — Inpatient Hospital Stay (HOSPITAL_BASED_OUTPATIENT_CLINIC_OR_DEPARTMENT_OTHER): Payer: Medicare Other | Admitting: Hematology

## 2021-09-21 VITALS — BP 135/71 | HR 89 | Temp 97.9°F | Resp 18 | Ht 71.5 in | Wt 197.1 lb

## 2021-09-21 DIAGNOSIS — Z85038 Personal history of other malignant neoplasm of large intestine: Secondary | ICD-10-CM | POA: Diagnosis not present

## 2021-09-21 DIAGNOSIS — C184 Malignant neoplasm of transverse colon: Secondary | ICD-10-CM

## 2021-09-21 DIAGNOSIS — E559 Vitamin D deficiency, unspecified: Secondary | ICD-10-CM | POA: Diagnosis not present

## 2021-09-21 DIAGNOSIS — Z9221 Personal history of antineoplastic chemotherapy: Secondary | ICD-10-CM | POA: Diagnosis not present

## 2021-09-21 DIAGNOSIS — D696 Thrombocytopenia, unspecified: Secondary | ICD-10-CM | POA: Diagnosis not present

## 2021-09-21 DIAGNOSIS — E538 Deficiency of other specified B group vitamins: Secondary | ICD-10-CM | POA: Diagnosis not present

## 2021-09-21 DIAGNOSIS — Z79899 Other long term (current) drug therapy: Secondary | ICD-10-CM | POA: Diagnosis not present

## 2021-09-21 NOTE — Patient Instructions (Signed)
[place] at [facility] Discharge Instructions  You were seen and examined today by Dr. Delton Coombes. He reviewed your most recent labs and scan and everything looks good. Please keep follow up appointment as scheduled in 6 months.   Thank you for choosing [place] at [facility] to provide your oncology and hematology care.  To afford each patient quality time with our provider, please arrive at least 15 minutes before your scheduled appointment time.   If you have a lab appointment with the [facility] please come in thru the Main Entrance and check in at the main information desk.  You need to re-schedule your appointment should you arrive 10 or more minutes late.  We strive to give you quality time with our providers, and arriving late affects you and other patients whose appointments are after yours.  Also, if you no show three or more times for appointments you may be dismissed from the clinic at the providers discretion.     Again, thank you for choosing [facility].  Our hope is that these requests will decrease the amount of time that you wait before being seen by our physicians.       _____________________________________________________________  Should you have questions after your visit to [facility], please contact our office at [phone] and follow the prompts.  Our office hours are 8:00 a.m. and 4:30 p.m. Monday - Friday.  Please note that voicemails left after 4:00 p.m. may not be returned until the following business day.  We are closed weekends and major holidays.  You do have access to a nurse 24-7, just call the main number to the clinic [phone] and do not press any options, hold on the line and a nurse will answer the phone.    For prescription refill requests, have your pharmacy contact our office and allow 72 hours.    Due to Covid, you will need to wear a mask upon entering the hospital.  If you do not have a mask, a mask will be given to you at the Main Entrance upon arrival. For doctor visits, patients may have 1 support person age 78 or older with them. For treatment visits, patients can not have anyone with them due to social distancing guidelines and our immunocompromised population.

## 2021-09-21 NOTE — Progress Notes (Signed)
[place], [place] [address]   CLINIC:  Medical Oncology/Hematology  PCP:  Glenda Chroman, MD [address] / [place] [address] [phone]   REASON FOR VISIT:  Follow-up for transverse colon cancer  PRIOR THERAPY:  1. Colon resection with colostomy in 09/2007. 2. FOLFOX and radiation from 09/2007 through 02/2008 3. 12 cycles of infusional 5-FU and leucovorin from 10/09/2018 through 03/21/2019.  NGS Results: Foundation 1 MSI--stable  CURRENT THERAPY: Surveillance  BRIEF ONCOLOGIC HISTORY:  Oncology History  Cancer of transverse colon ([place])  09/23/2018 Initial Diagnosis   Cancer of transverse colon ([place])   09/29/2018 Cancer Staging   Staging form: Colon and Rectum, AJCC 8th Edition - Clinical stage from 09/29/2018: Stage IIB (cT4a, cN0, cM0) - Signed by Derek Jack, MD on 09/29/2018    10/09/2018 - 03/23/2019 Chemotherapy   The patient had ondansetron (ZOFRAN) 8 mg in sodium chloride 0.9 % 50 mL IVPB, 8 mg (100 % of original dose 8 mg), Intravenous,  Once, 12 of 12 cycles Dose modification: 8 mg (original dose 8 mg, Cycle 1) Administration: 8 mg (10/09/2018), 8 mg (10/23/2018), 8 mg (11/06/2018), 8 mg (11/20/2018), 8 mg (12/04/2018), 8 mg (12/18/2018), 8 mg (01/02/2019), 8 mg (01/15/2019), 8 mg (02/06/2019), 8 mg (02/21/2019), 8 mg (03/07/2019), 8 mg (03/21/2019) leucovorin 800 mg in dextrose 5 % 250 mL infusion, 820 mg, Intravenous,  Once, 12 of 12 cycles Administration: 800 mg (10/09/2018), 820 mg (10/23/2018), 820 mg (11/06/2018), 820 mg (11/20/2018), 820 mg (12/04/2018), 820 mg (12/18/2018), 820 mg (01/02/2019), 820 mg (01/15/2019), 820 mg (02/06/2019), 820 mg (02/21/2019), 820 mg (03/07/2019), 820 mg (03/21/2019) fluorouracil (ADRUCIL) chemo injection 800 mg, 400 mg/m2 = 800 mg, Intravenous,  Once, 12 of 12 cycles Administration: 800 mg (10/09/2018), 800 mg (10/23/2018), 800 mg (11/06/2018), 800 mg (11/20/2018), 800 mg (12/04/2018), 800 mg (12/18/2018), 800 mg  (01/02/2019), 800 mg (01/15/2019), 800 mg (02/06/2019), 800 mg (02/21/2019), 800 mg (03/07/2019), 800 mg (03/21/2019) fluorouracil (ADRUCIL) 4,900 mg in sodium chloride 0.9 % 52 mL chemo infusion, 2,400 mg/m2 = 4,900 mg, Intravenous, 1 Day/Dose, 12 of 12 cycles Administration: 4,900 mg (10/09/2018), 4,900 mg (10/23/2018), 5,000 mg (11/06/2018), 5,000 mg (11/20/2018), 5,000 mg (12/04/2018), 5,000 mg (12/18/2018), 5,000 mg (01/02/2019), 5,000 mg (01/15/2019), 5,000 mg (02/06/2019), 5,000 mg (02/21/2019), 5,000 mg (03/07/2019), 5,000 mg (03/21/2019)   for chemotherapy treatment.       CANCER STAGING:  Cancer Staging  Cancer of transverse colon [facility]) Staging form: Colon and Rectum, AJCC 8th Edition - Clinical stage from 09/29/2018: Stage IIB (cT4a, cN0, cM0) - Signed by Derek Jack, MD on 09/29/2018   INTERVAL HISTORY:  [name], a 81 y.o. male, returns for routine follow-up of his transverse colon cancer. Tarig was last seen on 12/01/2020.   Today he reports feeling well. He reports regular BM and denies black stool. He reports losing 4 lbs in the past week. He continues to take vitamin D and vitamin B-12. He reports fatigue.   REVIEW OF SYSTEMS:  Review of Systems  Constitutional:  Positive for fatigue and unexpected weight change. Negative for appetite change.  Gastrointestinal:  Positive for nausea. Negative for constipation and diarrhea.  Neurological:  Positive for dizziness and numbness.  Psychiatric/Behavioral:  Positive for sleep disturbance.   All other systems reviewed and are negative.  PAST MEDICAL/SURGICAL HISTORY:  Past Medical History:  Diagnosis Date   Anemia    Causing interruption in anticoagulation   Atrial fibrillation and  flutter ([place])    BPH (benign prostatic hypertrophy)    Colon cancer ([place])    Status post LAR 2008   Hypothyroidism    Lacunar infarction ([place]) 11/2012   RT 3rd NERVE PALSY, SB [name]   PE (pulmonary embolism)    Temporarily on Eliquis  2017   Skin cancer    35 years ago   Type 2 diabetes mellitus ([place])    Vitamin B 12 deficiency 07/10/2019   Past Surgical History:  Procedure Laterality Date   ABDOMINOPERINEAL PROCTOCOLECTOMY  2009   end colostomy    BIOPSY  08/25/2018   Procedure: BIOPSY;  Surgeon: Rogene Houston, MD;  Location[facility];  Service: Endoscopy;;   BLADDER REPAIR  08/29/2018   Procedure: BLADDER REPAIR;  Surgeon: [name], MD;  Location: [facility] ORS;  Service: General;;   COLECTOMY  2008   COLONOSCOPY WITH PROPOFOL N/A 08/25/2018   Procedure: COLONOSCOPY WITH PROPOFOL;  Surgeon: Rogene Houston, MD;  Location: [facility] END[facility];  Service: Endoscopy;  Laterality: N/A;  2:20   COLOSTOMY     EXPLORATORY LAPAROTOMY  2017   ? pneumoperitoneum of unknown etiology/ negative Ex lap   ILEOSTOMY  08/29/2018   Procedure: ILEOSTOMY;  Surgeon: [name], MD;  Location: [facility] ORS;  Service: General;;   LAPAROSCOPIC CHOLECYSTECTOMY  2007   LYSIS OF ADHESION  08/29/2018   Procedure: LYSIS OF ADHESION;  Surgeon: [name], MD;  Location: [facility] ORS;  Service: General;;   PARTIAL COLECTOMY N/A 08/29/2018   Procedure: PARTIAL COLECTOMY;  Surgeon: [name], MD;  Location: [facility] ORS;  Service: General;  Laterality: N/A;   PORTACATH PLACEMENT Left 10/04/2018   Procedure: INSERTION PORT-A-CATH (attached catheter left subclavian);  Surgeon: [name], MD;  Location: [facility] ORS;  Service: General;  Laterality: Left;   SBO SURGERY  06/2009    SOCIAL HISTORY:  Social History   Socioeconomic History   Marital status: Married    Spouse name: Not on file   Number of children: Not on file   Years of education: Not on file   Highest education level: Not on file  Occupational History   Occupation: Architect    Comment: [employer]  Tobacco Use   Smoking status: Former    Packs/day: 1.00    Years: 27.00    Pack years: 27.00    Types: Cigarettes    Start date: 01/27/1964    Quit date: 01/27/1991     Years since quitting: 30.6   Smokeless tobacco: Former    Types: Chew    Quit date: 08/10/1995  Vaping Use   Vaping Use: Never used  Substance and Sexual Activity   Alcohol use: Never    Alcohol/week: 0.0 standard drinks   Drug use: Never   Sexual activity: Not Currently  Other Topics Concern   Not on file  Social History Narrative   Not on file   Social Determinants of Health   Financial Resource Strain: Not on file  Food Insecurity: Not on file  Transportation Needs: Not on file  Physical Activity: Not on file  Stress: Not on file  Social Connections: Not on file  Intimate Partner Violence: Not on file    FAMILY HISTORY:  Family History  Problem Relation Age of Onset   Heart disease Father    Heart failure Father    Heart attack Father    Prostate cancer Paternal Uncle     CURRENT MEDICATIONS:  Current Outpatient  Medications  Medication Sig Dispense Refill   ergocalciferol (VITAMIN D2) 1.25 MG (50000 UT) capsule Take 1 capsule (50,000 Units total) by mouth once a week. 16 capsule 4   ferrous sulfate (FERROUSUL) 325 (65 FE) MG tablet Take 1 tablet (325 mg total) by mouth 2 (two) times daily with a meal. 60 tablet 3   glipiZIDE (GLUCOTROL) 10 MG tablet Take 10 mg by mouth 2 (two) times daily.     levofloxacin (LEVAQUIN) 500 MG tablet Take by mouth.     levothyroxine (SYNTHROID, LEVOTHROID) 50 MCG tablet Take 50 mcg by mouth daily before breakfast.     loperamide (IMODIUM) 2 MG capsule Take 2 capsules (4 mg total) by mouth 4 (four) times daily -  before meals and at bedtime. 240 capsule 3   metoprolol tartrate (LOPRESSOR) 25 MG tablet Take 1 tablet (25 mg total) by mouth 2 (two) times daily. 60 tablet 0   tamsulosin (FLOMAX) 0.4 MG CAPS capsule Take 0.4 mg by mouth at bedtime.      No current facility-administered medications for this visit.    ALLERGIES:  No Known Allergies  PHYSICAL EXAM:  Performance status (ECOG): 2 - Symptomatic, <50% confined to  bed  Vitals:   09/21/21 1452  BP: 135/71  Pulse: 89  Resp: 18  Temp: 97.9 F (36.6 C)  SpO2: 97%   Wt Readings from Last 3 Encounters:  09/21/21 197 lb 1.6 oz (89.4 kg)  06/23/21 200 lb (90.7 kg)  03/05/21 202 lb 12.8 oz (92 kg)   Physical Exam Vitals reviewed.  Constitutional:      Appearance: Normal appearance.  Cardiovascular:     Rate and Rhythm: Normal rate and regular rhythm.     Pulses: Normal pulses.     Heart sounds: Normal heart sounds.  Pulmonary:     Effort: Pulmonary effort is normal.     Breath sounds: Normal breath sounds.  Abdominal:     Palpations: Abdomen is soft. There is no hepatomegaly, splenomegaly or mass.     Tenderness: There is no abdominal tenderness.  Neurological:     General: No focal deficit present.     Mental Status: He is alert and oriented to person, place, and time.  Psychiatric:        Mood and Affect: Mood normal.        Behavior: Behavior normal.     LABORATORY DATA:  I have reviewed the labs as listed.  CBC Latest Ref Rng & Units 09/14/2021 02/20/2021 12/25/2020  WBC 4.0 - 10.5 K/uL 7.6 4.2 5.6  Hemoglobin 13.0 - 17.0 g/dL 17.3(H) 14.1 16.5  Hematocrit 39.0 - 52.0 % 49.7 41.6 47.5  Platelets 150 - 400 K/uL 101(L) 95(L) 95(L)   CMP Latest Ref Rng & Units 09/14/2021 02/20/2021 12/27/2020  Glucose 70 - 99 mg/dL 162(H) 185(H) 131(H)  BUN 8 - 23 mg/dL 15 11 54(H)  Creatinine 0.61 - 1.24 mg/dL 1.32(H) 1.09 2.07(H)  Sodium 135 - 145 mmol/L 133(L) 135 128(L)  Potassium 3.5 - 5.1 mmol/L 4.8 4.3 4.1  Chloride 98 - 111 mmol/L 100 105 100  CO2 22 - 32 mmol/L _0 Calcium 8.9 - 10.3 mg/dL 9.4 8.9 8.4(L)  Total Protein 6.5 - 8.1 g/dL 7.8 6.8 -  Total Bilirubin 0.3 - 1.2 mg/dL 2.3(H) 0.9 -  Alkaline Phos 38 - 126 U/L 163(H) 127(H) -  AST 15 - 41 U/L 42(H) 30 -  ALT 0 - 44 U/L 30 23 -  DIAGNOSTIC IMAGING:  I have independently reviewed the scans and discussed with the patient. No results found.   ASSESSMENT:  1.  Stage IIb  (T4N0) transverse colon adenocarcinoma, MSI stable: -History of colon cancer in 2008, status post resection, did not receive any adjuvant chemo. -Recurrence in February 2009, underwent resection with colostomy, followed by 6 months of FOLFOX with radiation. -12 cycles of adjuvant chemotherapy with infusional 5-FU/leucovorin from 10/09/2018-03/21/2019. -CTAP on 10/08/2019 did not show any evidence of recurrence. -CTAP on 04/21/2020 shows unchanged postoperative appearance of the low pelvis and presacral soft tissues with no evidence of recurrence or metastatic disease.   PLAN:  1.  Stage IIb (T4N0) transverse colon adenocarcinoma, MSI stable: - Denies any change in consistency of the stool in the colostomy bag.  Denies any bleeding. - Reviewed labs from 09/14/2021 which showed AST mildly elevated at 42.  CKD stable with creatinine of 1.32.  Other LFTs are normal.  CEA was 2.5. - Recommend follow-up in 6 months with a CT [facility] with contrast and a CEA level.   2.  B12 Deficiency: - Continue B12 supplements.  B12 level is 344.  3.  Vitamin D deficiency: - Continue vitamin D supplements.  Vitamin D level is 36.   4.  Moderate thrombocytopenia: - Mild to moderate thrombocytopenia stable.  Last platelet count 101. - Last CT scan on 11/26/2020 showed normal spleen.  Differential includes immune mediated thrombocytopenia.    Orders placed this encounter:  No orders of the defined types were placed in this encounter.    Derek Jack, MD [place] [phone]   I, Thana Ates, am acting as a scribe for Dr. Derek Jack.  I, Derek Jack MD, have reviewed the above documentation for accuracy and completeness, and I agree with the above.

## 2021-10-02 DIAGNOSIS — N400 Enlarged prostate without lower urinary tract symptoms: Secondary | ICD-10-CM | POA: Diagnosis not present

## 2021-10-02 DIAGNOSIS — R21 Rash and other nonspecific skin eruption: Secondary | ICD-10-CM | POA: Diagnosis not present

## 2021-10-02 DIAGNOSIS — N390 Urinary tract infection, site not specified: Secondary | ICD-10-CM | POA: Diagnosis not present

## 2021-10-02 DIAGNOSIS — Z299 Encounter for prophylactic measures, unspecified: Secondary | ICD-10-CM | POA: Diagnosis not present

## 2021-10-02 DIAGNOSIS — I10 Essential (primary) hypertension: Secondary | ICD-10-CM | POA: Diagnosis not present

## 2021-10-02 DIAGNOSIS — E1165 Type 2 diabetes mellitus with hyperglycemia: Secondary | ICD-10-CM | POA: Diagnosis not present

## 2021-10-03 ENCOUNTER — Observation Stay (HOSPITAL_COMMUNITY)
Admission: EM | Admit: 2021-10-03 | Discharge: 2021-10-04 | Disposition: A | Discharge status: Home / Self Care | Payer: Medicare Other | Attending: Family Medicine | Admitting: Family Medicine

## 2021-10-03 ENCOUNTER — Encounter (HOSPITAL_COMMUNITY): Payer: Self-pay

## 2021-10-03 ENCOUNTER — Other Ambulatory Visit: Payer: Self-pay

## 2021-10-03 ENCOUNTER — Emergency Department (HOSPITAL_COMMUNITY): Payer: Medicare Other

## 2021-10-03 DIAGNOSIS — N400 Enlarged prostate without lower urinary tract symptoms: Secondary | ICD-10-CM | POA: Diagnosis present

## 2021-10-03 DIAGNOSIS — Z7984 Long term (current) use of oral hypoglycemic drugs: Secondary | ICD-10-CM | POA: Insufficient documentation

## 2021-10-03 DIAGNOSIS — Z79899 Other long term (current) drug therapy: Secondary | ICD-10-CM | POA: Diagnosis not present

## 2021-10-03 DIAGNOSIS — K5710 Diverticulosis of small intestine without perforation or abscess without bleeding: Secondary | ICD-10-CM | POA: Insufficient documentation

## 2021-10-03 DIAGNOSIS — Z432 Encounter for attention to ileostomy: Secondary | ICD-10-CM

## 2021-10-03 DIAGNOSIS — K922 Gastrointestinal hemorrhage, unspecified: Secondary | ICD-10-CM

## 2021-10-03 DIAGNOSIS — E1165 Type 2 diabetes mellitus with hyperglycemia: Secondary | ICD-10-CM | POA: Diagnosis present

## 2021-10-03 DIAGNOSIS — Z87891 Personal history of nicotine dependence: Secondary | ICD-10-CM | POA: Insufficient documentation

## 2021-10-03 DIAGNOSIS — K9411 Enterostomy hemorrhage: Principal | ICD-10-CM | POA: Insufficient documentation

## 2021-10-03 DIAGNOSIS — E039 Hypothyroidism, unspecified: Secondary | ICD-10-CM | POA: Diagnosis not present

## 2021-10-03 DIAGNOSIS — E119 Type 2 diabetes mellitus without complications: Secondary | ICD-10-CM | POA: Diagnosis not present

## 2021-10-03 DIAGNOSIS — Z85038 Personal history of other malignant neoplasm of large intestine: Secondary | ICD-10-CM | POA: Diagnosis not present

## 2021-10-03 DIAGNOSIS — Z933 Colostomy status: Secondary | ICD-10-CM

## 2021-10-03 DIAGNOSIS — Z85828 Personal history of other malignant neoplasm of skin: Secondary | ICD-10-CM | POA: Insufficient documentation

## 2021-10-03 DIAGNOSIS — N179 Acute kidney failure, unspecified: Secondary | ICD-10-CM | POA: Diagnosis not present

## 2021-10-03 DIAGNOSIS — D649 Anemia, unspecified: Secondary | ICD-10-CM

## 2021-10-03 DIAGNOSIS — C189 Malignant neoplasm of colon, unspecified: Secondary | ICD-10-CM | POA: Diagnosis present

## 2021-10-03 DIAGNOSIS — R531 Weakness: Secondary | ICD-10-CM | POA: Diagnosis not present

## 2021-10-03 DIAGNOSIS — Z9049 Acquired absence of other specified parts of digestive tract: Secondary | ICD-10-CM | POA: Diagnosis not present

## 2021-10-03 DIAGNOSIS — I482 Chronic atrial fibrillation, unspecified: Secondary | ICD-10-CM | POA: Insufficient documentation

## 2021-10-03 DIAGNOSIS — Z85048 Personal history of other malignant neoplasm of rectum, rectosigmoid junction, and anus: Secondary | ICD-10-CM | POA: Diagnosis not present

## 2021-10-03 DIAGNOSIS — C184 Malignant neoplasm of transverse colon: Secondary | ICD-10-CM | POA: Insufficient documentation

## 2021-10-03 DIAGNOSIS — I700 Atherosclerosis of aorta: Secondary | ICD-10-CM | POA: Diagnosis not present

## 2021-10-03 DIAGNOSIS — I10 Essential (primary) hypertension: Secondary | ICD-10-CM | POA: Insufficient documentation

## 2021-10-03 LAB — COMPREHENSIVE METABOLIC PANEL
ALT: 34 U/L (ref 0–44)
AST: 48 U/L — ABNORMAL HIGH (ref 15–41)
Albumin: 3.1 g/dL — ABNORMAL LOW (ref 3.5–5.0)
Alkaline Phosphatase: 138 U/L — ABNORMAL HIGH (ref 38–126)
Anion gap: 3 — ABNORMAL LOW (ref 5–15)
BUN: 17 mg/dL (ref 8–23)
CO2: 25 mmol/L (ref 22–32)
Calcium: 8.8 mg/dL — ABNORMAL LOW (ref 8.9–10.3)
Chloride: 105 mmol/L (ref 98–111)
Creatinine, Ser: 1.24 mg/dL (ref 0.61–1.24)
GFR, Estimated: 60 mL/min — ABNORMAL LOW (ref >60)
Glucose, Bld: 236 mg/dL — ABNORMAL HIGH (ref 70–99)
Potassium: 4.8 mmol/L (ref 3.5–5.1)
Sodium: 133 mmol/L — ABNORMAL LOW (ref 135–145)
Total Bilirubin: 0.8 mg/dL (ref 0.3–1.2)
Total Protein: 6.8 g/dL (ref 6.5–8.1)

## 2021-10-03 LAB — CBC WITH DIFFERENTIAL/PLATELET
Abs Immature Granulocytes: 0.01 10*3/uL (ref 0.00–0.07)
Basophils Absolute: 0 10*3/uL (ref 0.0–0.1)
Basophils Relative: 1 %
Eosinophils Absolute: 0.1 10*3/uL (ref 0.0–0.5)
Eosinophils Relative: 2 %
HCT: 40.4 % (ref 39.0–52.0)
Hemoglobin: 13.8 g/dL (ref 13.0–17.0)
Immature Granulocytes: 0 %
Lymphocytes Relative: 20 %
Lymphs Abs: 0.8 10*3/uL (ref 0.7–4.0)
MCH: 34.8 pg — ABNORMAL HIGH (ref 26.0–34.0)
MCHC: 34.2 g/dL (ref 30.0–36.0)
MCV: 101.8 fL — ABNORMAL HIGH (ref 80.0–100.0)
Monocytes Absolute: 0.4 10*3/uL (ref 0.1–1.0)
Monocytes Relative: 10 %
Neutro Abs: 2.7 10*3/uL (ref 1.7–7.7)
Neutrophils Relative %: 67 %
Platelets: 69 10*3/uL — ABNORMAL LOW (ref 150–400)
RBC: 3.97 MIL/uL — ABNORMAL LOW (ref 4.22–5.81)
RDW: 13.7 % (ref 11.5–15.5)
WBC: 4 10*3/uL (ref 4.0–10.5)
nRBC: 0 % (ref 0.0–0.2)

## 2021-10-03 LAB — GLUCOSE, CAPILLARY
Glucose-Capillary: 105 mg/dL — ABNORMAL HIGH (ref 70–99)
Glucose-Capillary: 154 mg/dL — ABNORMAL HIGH (ref 70–99)

## 2021-10-03 LAB — PROTIME-INR
INR: 1.1 (ref 0.8–1.2)
Prothrombin Time: 13.8 seconds (ref 11.4–15.2)

## 2021-10-03 LAB — HEMOGLOBIN A1C
Hgb A1c MFr Bld: 8.2 % — ABNORMAL HIGH (ref 4.8–5.6)
Mean Plasma Glucose: 188.64 mg/dL

## 2021-10-03 MED ORDER — ONDANSETRON HCL 4 MG PO TABS: 4.0000 mg | ORAL_TABLET | Freq: Four times a day (QID) | ORAL | Status: DC | PRN

## 2021-10-03 MED ORDER — PEG 3350-KCL-NA BICARB-NACL 420 G PO SOLR
4000.0000 mL | Freq: Once | ORAL | Status: AC
  Administered 2021-10-03: 4000 mL via ORAL
  Filled 2021-10-03: qty 4000

## 2021-10-03 MED ORDER — SODIUM CHLORIDE 0.9% FLUSH: 3.0000 mL | INTRAVENOUS | Status: DC | PRN

## 2021-10-03 MED ORDER — INSULIN ASPART 100 UNIT/ML IJ SOLN: 0.0000 [IU] | Freq: Every day | INTRAMUSCULAR | Status: DC

## 2021-10-03 MED ORDER — LOPERAMIDE HCL 2 MG PO CAPS
4.0000 mg | ORAL_CAPSULE | Freq: Three times a day (TID) | ORAL | Status: DC
  Filled 2021-10-03 (×2): qty 2

## 2021-10-03 MED ORDER — ACETAMINOPHEN 650 MG RE SUPP: 650.0000 mg | Freq: Four times a day (QID) | RECTAL | Status: DC | PRN

## 2021-10-03 MED ORDER — SODIUM CHLORIDE 0.9% FLUSH
3.0000 mL | Freq: Two times a day (BID) | INTRAVENOUS | Status: DC
  Administered 2021-10-04: 3 mL via INTRAVENOUS

## 2021-10-03 MED ORDER — SODIUM CHLORIDE 0.9 % IV SOLN: INTRAVENOUS | Status: DC

## 2021-10-03 MED ORDER — ONDANSETRON HCL 4 MG/2ML IJ SOLN: 4.0000 mg | Freq: Four times a day (QID) | INTRAMUSCULAR | Status: DC | PRN

## 2021-10-03 MED ORDER — ACETAMINOPHEN 325 MG PO TABS: 650.0000 mg | ORAL_TABLET | Freq: Four times a day (QID) | ORAL | Status: DC | PRN

## 2021-10-03 MED ORDER — TRAZODONE HCL 50 MG PO TABS: 50.0000 mg | ORAL_TABLET | Freq: Every evening | ORAL | Status: DC | PRN

## 2021-10-03 MED ORDER — ERGOCALCIFEROL 1.25 MG (50000 UT) PO CAPS
50000.0000 [IU] | ORAL_CAPSULE | ORAL | Status: DC
  Filled 2021-10-03: qty 1

## 2021-10-03 MED ORDER — TAMSULOSIN HCL 0.4 MG PO CAPS
0.4000 mg | ORAL_CAPSULE | Freq: Every day | ORAL | Status: DC
  Administered 2021-10-03: 0.4 mg via ORAL
  Filled 2021-10-03: qty 1

## 2021-10-03 MED ORDER — LEVOTHYROXINE SODIUM 50 MCG PO TABS: 50.0000 ug | ORAL_TABLET | Freq: Every day | ORAL | Status: DC

## 2021-10-03 MED ORDER — IOHEXOL 300 MG/ML  SOLN
100.0000 mL | Freq: Once | INTRAMUSCULAR | Status: AC | PRN
  Administered 2021-10-03: 100 mL via INTRAVENOUS

## 2021-10-03 MED ORDER — PANTOPRAZOLE SODIUM 40 MG IV SOLR
40.0000 mg | Freq: Once | INTRAVENOUS | Status: AC
  Administered 2021-10-03: 40 mg via INTRAVENOUS
  Filled 2021-10-03: qty 10

## 2021-10-03 MED ORDER — METOPROLOL TARTRATE 25 MG PO TABS
12.5000 mg | ORAL_TABLET | Freq: Two times a day (BID) | ORAL | Status: DC
  Filled 2021-10-03: qty 1

## 2021-10-03 MED ORDER — PANTOPRAZOLE SODIUM 40 MG IV SOLR
40.0000 mg | Freq: Two times a day (BID) | INTRAVENOUS | Status: DC
  Administered 2021-10-03: 40 mg via INTRAVENOUS
  Filled 2021-10-03: qty 10

## 2021-10-03 MED ORDER — SODIUM CHLORIDE 0.9 % IV SOLN: 250.0000 mL | INTRAVENOUS | Status: DC | PRN

## 2021-10-03 MED ORDER — SODIUM CHLORIDE 0.9 % IV BOLUS
250.0000 mL | Freq: Once | INTRAVENOUS | Status: AC
  Administered 2021-10-03: 250 mL via INTRAVENOUS

## 2021-10-03 MED ORDER — BISACODYL 10 MG RE SUPP: 10.0000 mg | Freq: Every day | RECTAL | Status: DC | PRN

## 2021-10-03 MED ORDER — INSULIN ASPART 100 UNIT/ML IJ SOLN: 0.0000 [IU] | Freq: Three times a day (TID) | INTRAMUSCULAR | Status: DC

## 2021-10-03 MED ORDER — METOPROLOL TARTRATE 25 MG PO TABS: 25.0000 mg | ORAL_TABLET | Freq: Two times a day (BID) | ORAL | Status: DC

## 2021-10-03 MED ORDER — POLYETHYLENE GLYCOL 3350 17 G PO PACK: 17.0000 g | PACK | Freq: Every day | ORAL | Status: DC | PRN

## 2021-10-03 NOTE — ED Triage Notes (Signed)
Pt reports blood in his colostomy bag. Started this morning with red blood. Denies surgery or belly pain. Wife reports patient being weak over the last week.

## 2021-10-03 NOTE — ED Provider Notes (Signed)
[facility] EMERGENCY DEPARTMENT Provider Note   CSN: [id number] Arrival date & time: 10/03/21  1043     History Chief Complaint  Patient presents with   GI Problem    Blood in colostomy bag    Jose Lawrence is a 81 y.o. male with history of colon cancer status post total colectomy with ileostomy who presents to the emergency department with gross red blood in the ileostomy bag.  Patient states that he sometimes gets small amounts of blood in the bag but today was grossly bloody.  He denies any abdominal pain, fever, chills, cough, congestion, nausea, vomiting.   GI Problem      Home Medications Prior to Admission medications   Medication Sig Start Date End Date Taking? Authorizing Provider  ergocalciferol (VITAMIN D2) 1.25 MG (50000 UT) capsule Take 1 capsule (50,000 Units total) by mouth once a week. 09/19/20   Doreatha Massed, MD  ferrous sulfate (FERROUSUL) 325 (65 FE) MG tablet Take 1 tablet (325 mg total) by mouth 2 (two) times daily with a meal. 07/17/19   Doreatha Massed, MD  glipiZIDE (GLUCOTROL) 10 MG tablet Take 10 mg by mouth 2 (two) times daily. 07/05/18   [provider]  levofloxacin (LEVAQUIN) 500 MG tablet Take by mouth. 09/16/21   [provider]  levothyroxine (SYNTHROID, LEVOTHROID) 50 MCG tablet Take 50 mcg by mouth daily before breakfast.    [provider]  loperamide (IMODIUM) 2 MG capsule Take 2 capsules (4 mg total) by mouth 4 (four) times daily -  before meals and at bedtime. 11/11/18   Lucretia Roers, MD  metoprolol tartrate (LOPRESSOR) 25 MG tablet Take 1 tablet (25 mg total) by mouth 2 (two) times daily. 09/04/18   Erick Blinks, MD  tamsulosin (FLOMAX) 0.4 MG CAPS capsule Take 0.4 mg by mouth at bedtime.     [provider]      Allergies    Patient has no known allergies.    Review of Systems   Review of Systems  All other systems reviewed and are negative.  Physical Exam Updated Vital  Signs BP 139/64    Pulse (!) 48    Temp (!) 97.5 F (36.4 C) (Oral)    Resp (!) 21    Wt 86.2 kg    SpO2 98%    BMI 26.13 kg/m  Physical Exam Vitals and nursing note reviewed.  Constitutional:      General: He is not in acute distress.    Appearance: Normal appearance.  HENT:     Head: Normocephalic and atraumatic.  Eyes:     General:        Right eye: No discharge.        Left eye: No discharge.  Cardiovascular:     Comments: Regular rate and rhythm.  S1/S2 are distinct without any evidence of murmur, rubs, or gallops.  Radial pulses are 2+ bilaterally.  Dorsalis pedis pulses are 2+ bilaterally.  No evidence of pedal edema. Pulmonary:     Comments: Clear to auscultation bilaterally.  Normal effort.  No respiratory distress.  No evidence of wheezes, rales, or rhonchi heard throughout. Abdominal:     General: Abdomen is flat. Bowel sounds are normal. There is no distension.     Tenderness: There is no abdominal tenderness. There is no guarding or rebound.    Musculoskeletal:        General: Normal range of motion.     Cervical back: Neck supple.  Skin:  General: Skin is warm and dry.     Findings: No rash.  Neurological:     General: No focal deficit present.     Mental Status: He is alert.  Psychiatric:        Mood and Affect: Mood normal.        Behavior: Behavior normal.    ED Results / Procedures / Treatments   Labs (all labs ordered are listed, but only abnormal results are displayed) Labs Reviewed  CBC WITH DIFFERENTIAL/PLATELET - Abnormal; Notable for the following components:      Result Value   RBC 3.97 (*)    MCV 101.8 (*)    MCH 34.8 (*)    Platelets 69 (*)    All other components within normal limits  COMPREHENSIVE METABOLIC PANEL - Abnormal; Notable for the following components:   Sodium 133 (*)    Glucose, Bld 236 (*)    Calcium 8.8 (*)    Albumin 3.1 (*)    AST 48 (*)    Alkaline Phosphatase 138 (*)    GFR, Estimated 60 (*)    Anion gap 3 (*)     All other components within normal limits  PROTIME-INR  HEMOGLOBIN A1C    EKG None  Radiology CT ABDOMEN PELVIS W CONTRAST  Result Date: 10/03/2021 CLINICAL DATA:  Bleeding from ileostomy. Weakness. Personal history of colorectal carcinoma. EXAM: CT ABDOMEN AND PELVIS WITH CONTRAST TECHNIQUE: Multidetector CT imaging of the abdomen and pelvis was performed using the standard protocol following bolus administration of intravenous contrast. RADIATION DOSE REDUCTION: This exam was performed according to the departmental dose-optimization program which includes automated exposure control, adjustment of the mA and/or kV according to patient size and/or use of iterative reconstruction technique. CONTRAST:  143mL OMNIPAQUE IOHEXOL 300 MG/ML  SOLN COMPARISON:  11/26/2020 FINDINGS: Lower Chest: No acute findings. Hepatobiliary: No hepatic masses identified. Prior cholecystectomy. No evidence of biliary obstruction. Pancreas:  No mass or inflammatory changes. Spleen: Within normal limits in size and appearance. Adrenals/Urinary Tract: No masses identified. No evidence of ureteral calculi or hydronephrosis. Stomach/Bowel: Prior total colectomy with right lower quadrant ileostomy again seen. Stable ill-defined soft tissue density and calcifications in the presacral space, consistent with post treatment changes. Ileostomy site and small-bowel are unremarkable in appearance. Vascular/Lymphatic: No pathologically enlarged lymph nodes. No acute vascular findings. Aortic atherosclerotic calcification noted. Reproductive:  No mass or other significant abnormality. Other:  None. Musculoskeletal:  No suspicious bone lesions identified. IMPRESSION: No acute findings. Stable post treatment changes in presacral space. No evidence of recurrent or metastatic carcinoma. Aortic Atherosclerosis (ICD10-I70.0). Electronically Signed   By: Marlaine Hind M.D.   On: 10/03/2021 13:28    Procedures Procedures    Medications Ordered  in ED Medications  insulin aspart (novoLOG) injection 0-5 Units (has no administration in time range)  insulin aspart (novoLOG) injection 0-6 Units (has no administration in time range)  sodium chloride 0.9 % bolus 250 mL (0 mLs Intravenous Stopped 10/03/21 1441)  pantoprazole (PROTONIX) injection 40 mg (40 mg Intravenous Given 10/03/21 1249)  iohexol (OMNIPAQUE) 300 MG/ML solution 100 mL (100 mLs Intravenous Contrast Given 10/03/21 1303)    ED Course/ Medical Decision Making/ A&P Clinical Course as of 10/03/21 1509  Sat Oct 03, 2021  1229 Spoke with Dr. Laural Golden with gastroenterology who recommends adding coagulation studies and a CT abdomen and admission for further evaluation. [CF]  1504 I spoke with Dr. Denton Brick with [facility] hospitalist who agrees to admit the patient.  [CF]  Clinical Course User Index [CF] Hendricks Limes, PA-C                           Medical Decision Making Amount and/or Complexity of Data Reviewed Labs: ordered. Radiology: ordered.  Risk Prescription drug management. Decision regarding hospitalization.   This patient presents to the ED for concern of bleeding in his ileostomy bag, this involves an extensive number of treatment options, and is a complaint that carries with it a high risk of complications and morbidity.  The differential diagnosis includes infectious etiology, upper GI bleed,   Co morbidities that complicate the patient evaluation  Colon cancer status post total colectomy with ileostomy   Additional history obtained:  Additional history obtained from old records. External records from outside source obtained and reviewed including hematology note with Dr. Delton Coombes where he had a normal report and is under surveillance.    Lab Tests:  I Ordered, and personally interpreted labs.  The pertinent results include: CBC which is evidence of leukocytosis with did have thrombocytopenia.  PT/INR is normal. CMP did show hyponatremia and  hypocalcemia.  Alk phos is elevated.   Imaging Studies ordered:  I ordered imaging studies including CT abdomen pelvis with contrast I independently visualized and interpreted imaging which showed no acute abnormalities. No obvious signs of recurrence of colon cancer. I agree with the radiologist interpretation   Cardiac Monitoring:  The patient was maintained on a cardiac monitor.  I personally viewed and interpreted the cardiac monitored which showed an underlying rhythm of: Bradycardia   Critical Interventions:  Protonix IV.   Consultations Obtained:  See above    Problem List / ED Course:  Gross bleeding from ileostomy site.  Given his complicated gastrointestinal history and after speaking with gastroenterology we will plan to admit the patient.  Patient is in no acute distress.  Patient was found to be bradycardic and is asymptomatic. Patient will be admitted to the hospitalist service.    Reevaluation:  After the interventions noted above, I reevaluated the patient and found that they have :stayed the same  Dispostion:  After consideration of the diagnostic results and the patients response to treatment, I feel that the patent would benefit from admission in the hospital.  Final Clinical Impression(s) / ED Diagnoses Final diagnoses:  Ileostomy care [facility])    Rx / DC Orders ED Discharge Orders     None         Cherrie Gauze 10/03/21 1509    Milton Ferguson, MD 10/05/21 [phone]

## 2021-10-03 NOTE — Consult Note (Signed)
Referring Provider: Roxan Hockey, MD Primary Care Physician:  Glenda Chroman, MD Primary Gastroenterologist:  Dr. Laural Golden  Reason for Consultation:    Bleeding while ileostomy.  HPI:   Patient is 81 year old Caucasian male with history of colon carcinoma in remission(low anterior resection and 2009 followed by APR for residual disease followed by chemotherapy; another primary in January 2020 leading to removal of remaining colon and permanent ileostomy) who was in usual state of health when he woke up this morning.  He emptied ileostomy bag and had brownish stool.  Around 9 AM he noted bright red blood into ileostomy bag.  It was large-volume.  He did not experience abdominal pain cramping nausea vomiting weakness diaphoresis or lightheadedness. Patient came to emergency room for evaluation.  His H&H was normal.  CTA abdomen and pelvis did not reveal any bleeding site.  Patient said he has not passed any more blood via ileostomy.  He reports seeing streaks of blood every now and then but nothing like this before.  His appetite is good.  He has not lost any weight recently. Regarding his colon cancer he remains in remission.  He had chemotherapy back in 2009 and once again following his surgery in January 2020.  He is being followed by Dr. Delton Coombes.  CEA on 09/14/2021 was 2.5.  CT abdomen and pelvis in April 2022 did not reveal any evidence of metastatic disease.  Study from today also does not reveal any evidence of recurrent or metastatic disease.  Posttherapy changes noted in 3 sacral space as on previous study. He does not take aspirin NSAIDs or anticoagulants.  He has been on anticoagulant in the past because of history of DVT and pulmonary embolism.  His past GI history is also significant for laparotomy for pneumoperitoneum possibly in 2017 and there was no evidence of perforated viscus.  He is married.  He is retired.  He worked at [employer] for several years.  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  He used to smoke cigarettes but quit in June 1992.  He smoked about a pack a day for total of 27 years. Both of his parents are diseased.  Father died of heart disease in his 78s. Family history is negative for colon cancer.  One of his brothers had colon cancer another brother drowned while he was in his early 66s.    Past Medical History:  Diagnosis Date   Anemia    Causing interruption in anticoagulation   Atrial fibrillation and flutter (HCC)    BPH (benign prostatic hypertrophy)    Colon cancer ([place])    Status post LAR 2008   Hypothyroidism    Lacunar infarction ([place]) 11/2012   RT 3rd NERVE PALSY, SB [name]   PE (pulmonary embolism)    Temporarily on Eliquis 2017   Skin cancer    35 years ago   Type 2 diabetes mellitus ([place])    Vitamin B 12 deficiency 07/10/2019    Past Surgical History:  Procedure Laterality Date   ABDOMINOPERINEAL PROCTOCOLECTOMY  2009   end colostomy    BIOPSY  08/25/2018   Procedure: BIOPSY;  Surgeon: Rogene Houston, MD;  [facility];  Service: Endoscopy;;   BLADDER REPAIR  08/29/2018   Procedure: BLADDER REPAIR;  Surgeon: [name], MD;  Location: [facility];  Service: General;;   COLECTOMY  2008   COLONOSCOPY WITH PROPOFOL N/A 08/25/2018   Procedure: COLONOSCOPY  WITH PROPOFOL;  Surgeon: Rogene Houston, MD;  [facility];  Service: Endoscopy;  Laterality: N/A;  2:20   COLOSTOMY     EXPLORATORY LAPAROTOMY  2017   ? pneumoperitoneum of unknown etiology/ negative Ex lap   ILEOSTOMY  08/29/2018   Procedure: ILEOSTOMY;  Surgeon: [name], MD;  Location: [facility];  Service: General;;   LAPAROSCOPIC CHOLECYSTECTOMY  2007   LYSIS OF ADHESION  08/29/2018   Procedure: LYSIS OF ADHESION;  Surgeon: [name], MD;  Location: [facility];  Service: General;;   PARTIAL COLECTOMY N/A 08/29/2018   Procedure: PARTIAL COLECTOMY;  Surgeon: [name],  MD;  Location: [facility];  Service: General;  Laterality: N/A;   PORTACATH PLACEMENT Left 10/04/2018   Procedure: INSERTION PORT-A-CATH (attached catheter left subclavian);  Surgeon: [name], MD;  Location: [facility];  Service: General;  Laterality: Left;   SBO SURGERY  06/2009    Prior to Admission medications   Medication Sig Start Date End Date Taking? Authorizing Provider  ergocalciferol (VITAMIN D2) 1.25 MG (50000 UT) capsule Take 1 capsule (50,000 Units total) by mouth once a week. 09/19/20  Yes Derek Jack, MD  ferrous sulfate (FERROUSUL) 325 (65 FE) MG tablet Take 1 tablet (325 mg total) by mouth 2 (two) times daily with a meal. 07/17/19  Yes Derek Jack, MD  glipiZIDE (GLUCOTROL) 10 MG tablet Take 10 mg by mouth 2 (two) times daily. 07/05/18  Yes [provider]  lactose free nutrition (BOOST) LIQD Take 237 mLs by mouth 3 (three) times daily between meals.   Yes [provider]  levothyroxine (SYNTHROID, LEVOTHROID) 50 MCG tablet Take 50 mcg by mouth daily before breakfast.   Yes [provider]  loperamide (IMODIUM) 2 MG capsule Take 2 capsules (4 mg total) by mouth 4 (four) times daily -  before meals and at bedtime. 11/11/18  Yes [name], MD  metoprolol tartrate (LOPRESSOR) 25 MG tablet Take 1 tablet (25 mg total) by mouth 2 (two) times daily. 09/04/18  Yes Kathie Dike, MD  tamsulosin (FLOMAX) 0.4 MG CAPS capsule Take 0.4 mg by mouth at bedtime.    Yes [provider]    Current Facility-Administered Medications  Medication Dose Route Frequency Provider Last Rate Last Admin   insulin aspart (novoLOG) injection 0-5 Units  0-5 Units Subcutaneous QHS Emokpae, Courage, MD       insulin aspart (novoLOG) injection 0-6 Units  0-6 Units Subcutaneous TID WC Emokpae, Courage, MD       polyethylene glycol-electrolytes (NuLYTELY) solution 4,000 mL  4,000 mL Oral Once Rogene Houston, MD       Current Outpatient Medications   Medication Sig Dispense Refill   ergocalciferol (VITAMIN D2) 1.25 MG (50000 UT) capsule Take 1 capsule (50,000 Units total) by mouth once a week. 16 capsule 4   ferrous sulfate (FERROUSUL) 325 (65 FE) MG tablet Take 1 tablet (325 mg total) by mouth 2 (two) times daily with a meal. 60 tablet 3   glipiZIDE (GLUCOTROL) 10 MG tablet Take 10 mg by mouth 2 (two) times daily.     lactose free nutrition (BOOST) LIQD Take 237 mLs by mouth 3 (three) times daily between meals.     levothyroxine (SYNTHROID, LEVOTHROID) 50 MCG tablet Take 50 mcg by mouth daily before breakfast.     loperamide (IMODIUM) 2 MG capsule Take 2 capsules (4 mg total) by mouth 4 (four) times daily -  before meals and at  bedtime. 240 capsule 3   metoprolol tartrate (LOPRESSOR) 25 MG tablet Take 1 tablet (25 mg total) by mouth 2 (two) times daily. 60 tablet 0   tamsulosin (FLOMAX) 0.4 MG CAPS capsule Take 0.4 mg by mouth at bedtime.       Allergies as of 10/03/2021   (No Known Allergies)    Family History  Problem Relation Age of Onset   Heart disease Father    Heart failure Father    Heart attack Father    Prostate cancer Paternal Uncle     Social History   Socioeconomic History   Marital status: Married    Spouse name: Not on file   Number of children: Not on file   Years of education: Not on file   Highest education level: Not on file  Occupational History   Occupation: Architect    Comment: [employer]  Tobacco Use   Smoking status: Former    Packs/day: 1.00    Years: 27.00    Pack years: 27.00    Types: Cigarettes    Start date: 01/27/1964    Quit date: 01/27/1991    Years since quitting: 30.7   Smokeless tobacco: Former    Types: Chew    Quit date: 08/10/1995  Vaping Use   Vaping Use: Never used  Substance and Sexual Activity   Alcohol use: Never    Alcohol/week: 0.0 standard drinks   Drug use: Never   Sexual activity: Not Currently  Other Topics Concern   Not on file  Social History Narrative    Not on file   Social Determinants of Health   Financial Resource Strain: Not on file  Food Insecurity: Not on file  Transportation Needs: Not on file  Physical Activity: Not on file  Stress: Not on file  Social Connections: Not on file  Intimate Partner Violence: Not on file    Review of Systems: See HPI, otherwise normal ROS  Physical Exam: Temp:  [97.5 F (36.4 C)] 97.5 F (36.4 C) (02/25 1114) Pulse Rate:  [48-56] 48 (02/25 1430) Resp:  [13-23] 21 (02/25 1430) BP: (124-139)/(64-108) 139/64 (02/25 1430) SpO2:  [98 %-100 %] 98 % (02/25 1430) Weight:  [86.2 kg] 86.2 kg (02/25 1112)   Patient is alert and in no acute distress. Conjunctiva is pink.  Sclera is nonicteric. Oropharyngeal mucosa is normal. No neck masses thyromegaly or lymphadenopathy noted. Cardiac exam with regular rhythm normal S1 and S2.  No murmur or gallop noted. Auscultation lungs reveal vesicular breath sounds bilaterally.  No rales or rhonchi. Abdomen is symmetrical.  He has ileostomy bag in right lower quadrant containing liquid brownish stool.  There is scar left lower quadrant site of prior colostomy as well as lower midline scar.  On palpation abdomen is soft and nontender with organomegaly or masses. No peripheral edema or clubbing noted.  Intake/Output from previous day: No intake/output data recorded. Intake/Output this shift: No intake/output data recorded.  Lab Results: Recent Labs    10/03/21 1148  WBC 4.0  HGB 13.8  HCT 40.4  PLT 69*   BMET Recent Labs    10/03/21 1148  NA 133*  K 4.8  CL 105  CO2 25  GLUCOSE 236*  BUN 17  CREATININE 1.24  CALCIUM 8.8*   LFT Recent Labs    10/03/21 1148  PROT 6.8  ALBUMIN 3.1*  AST 48*  ALT 34  ALKPHOS 138*  BILITOT 0.8   PT/INR Recent Labs    10/03/21 1247  LABPROT  13.8  INR 1.1   Hepatitis Panel No results for input(s): HEPBSAG, HCVAB, HEPAIGM, HEPBIGM in the last 72 hours.  Studies/Results: CT ABDOMEN PELVIS W  CONTRAST  Result Date: 10/03/2021 CLINICAL DATA:  Bleeding from ileostomy. Weakness. Personal history of colorectal carcinoma. EXAM: CT ABDOMEN AND PELVIS WITH CONTRAST TECHNIQUE: Multidetector CT imaging of the abdomen and pelvis was performed using the standard protocol following bolus administration of intravenous contrast. RADIATION DOSE REDUCTION: This exam was performed according to the departmental dose-optimization program which includes automated exposure control, adjustment of the mA and/or kV according to patient size and/or use of iterative reconstruction technique. CONTRAST:  145mL OMNIPAQUE IOHEXOL 300 MG/ML  SOLN COMPARISON:  11/26/2020 FINDINGS: Lower Chest: No acute findings. Hepatobiliary: No hepatic masses identified. Prior cholecystectomy. No evidence of biliary obstruction. Pancreas:  No mass or inflammatory changes. Spleen: Within normal limits in size and appearance. Adrenals/Urinary Tract: No masses identified. No evidence of ureteral calculi or hydronephrosis. Stomach/Bowel: Prior total colectomy with right lower quadrant ileostomy again seen. Stable ill-defined soft tissue density and calcifications in the presacral space, consistent with post treatment changes. Ileostomy site and small-bowel are unremarkable in appearance. Vascular/Lymphatic: No pathologically enlarged lymph nodes. No acute vascular findings. Aortic atherosclerotic calcification noted. Reproductive:  No mass or other significant abnormality. Other:  None. Musculoskeletal:  No suspicious bone lesions identified. IMPRESSION: No acute findings. Stable post treatment changes in presacral space. No evidence of recurrent or metastatic carcinoma. Aortic Atherosclerosis (ICD10-I70.0). Electronically Signed   By: Marlaine Hind M.D.   On: 10/03/2021 13:28    Assessment;  Patient is 81 year old Caucasian male with history of colon carcinoma who was first primary was in 2009 and second primary was in January 2020 resulting in  removal of all of his colon who presents with painless large-volume bleeding while ileostomy.  CTA abdomen pelvis is unremarkable.  He does give history of intermittent streaks of blood via ileostomy.  He does not take aspirin or anticoagulants. He is hemodynamically stable.  It appears he has stopped bleeding he has ileostomy bag contains brownish liquid stool. Differential diagnosis includes vascular abnormalities ileitis and less likely small bowel neoplasm.  He does not have any worrisome symptoms.  Recommendations;  Proceed with diagnostic ileoscopy via stoma on 10/04/2021. Patient will be prepped with GoLytely this afternoon. If examination of distal small bowel is unremarkable will complete GI evaluation by doing small bowel given capsule study. Patient is agreeable to proceed with ileoscopy followed by small bowel given capsule study if necessary. We will continue to monitor H&H during this hospitalization.   LOS: 0 days   Onyx Schirmer  10/03/2021, 3:38 PM

## 2021-10-03 NOTE — ED Notes (Signed)
ED TO INPATIENT HANDOFF REPORT  ED Nurse Name and Phone #:   S Name/Age/Gender Jose Lawrence 81 y.o. male Room/Bed: APA18/APA18  Code Status   Code Status: Prior  Home/SNF/Other Home Patient oriented to: self, place, time, and situation Is this baseline? Yes   Triage Complete: Triage complete  Chief Complaint Acute GI bleeding [K92.2]  Triage Note Pt reports blood in his colostomy bag. Started this morning with red blood. Denies surgery or belly pain. Wife reports patient being weak over the last week.     Allergies No Known Allergies  Level of Care/Admitting Diagnosis ED Disposition     ED Disposition  Admit   Condition  --   [place]: [facility] [[id number]]  Level of Care: Telemetry [5]  Covid Evaluation: Asymptomatic Screening Protocol (No Symptoms)  Diagnosis: Acute GI bleeding [431540]  Admitting Physician: Morrison Old  Attending Physician: Morrison Old          B Medical/Surgery History Past Medical History:  Diagnosis Date   Anemia    Causing interruption in anticoagulation   Atrial fibrillation and flutter ([place])    BPH (benign prostatic hypertrophy)    Colon cancer ([place])    Status post LAR 2008   Hypothyroidism    Lacunar infarction ([place]) 11/2012   RT 3rd NERVE PALSY, SB [name]   PE (pulmonary embolism)    Temporarily on Eliquis 2017   Skin cancer    35 years ago   Type 2 diabetes mellitus ([place])    Vitamin B 12 deficiency 07/10/2019   Past Surgical History:  Procedure Laterality Date   ABDOMINOPERINEAL PROCTOCOLECTOMY  2009   end colostomy    BIOPSY  08/25/2018   Procedure: BIOPSY;  Surgeon: Rogene Houston, MD;  [facility];  Service: Endoscopy;;   BLADDER REPAIR  08/29/2018   Procedure: BLADDER REPAIR;  Surgeon: [name], MD;  Location: [facility];  Service: General;;   COLECTOMY  2008   COLONOSCOPY WITH PROPOFOL N/A 08/25/2018   Procedure: COLONOSCOPY WITH  PROPOFOL;  Surgeon: Rogene Houston, MD;  [facility];  Service: Endoscopy;  Laterality: N/A;  2:20   COLOSTOMY     EXPLORATORY LAPAROTOMY  2017   ? pneumoperitoneum of unknown etiology/ negative Ex lap   ILEOSTOMY  08/29/2018   Procedure: ILEOSTOMY;  Surgeon: [name], MD;  Location: [facility];  Service: General;;   LAPAROSCOPIC CHOLECYSTECTOMY  2007   LYSIS OF ADHESION  08/29/2018   Procedure: LYSIS OF ADHESION;  Surgeon: [name], MD;  Location: [facility];  Service: General;;   PARTIAL COLECTOMY N/A 08/29/2018   Procedure: PARTIAL COLECTOMY;  Surgeon: [name], MD;  Location: [facility];  Service: General;  Laterality: N/A;   PORTACATH PLACEMENT Left 10/04/2018   Procedure: INSERTION PORT-A-CATH (attached catheter left subclavian);  Surgeon: [name], MD;  Location: [facility];  Service: General;  Laterality: Left;   SBO SURGERY  06/2009     A IV Location/Drains/Wounds Patient Lines/Drains/Airways Status     Active Line/Drains/Airways     Name Placement date Placement time Site Days   Implanted Port 10/04/18 Left Chest 10/04/18  1209   Chest  1095   Peripheral IV 10/03/21 20 G Right Forearm 10/03/21  1149  Forearm  less than 1   Colostomy RLQ 08/29/18  1900  RLQ  1131            Intake/Output Last 24  hours No intake or output data in the 24 hours ending 10/03/21 1511  Labs/Imaging Results for orders placed or performed during the hospital encounter of 10/03/21 (from the past 48 hour(s))  CBC with Differential/Platelet     Status: Abnormal   Collection Time: 10/03/21 11:48 AM  Result Value Ref Range   WBC 4.0 4.0 - 10.5 K/uL   RBC 3.97 (L) 4.22 - 5.81 MIL/uL   Hemoglobin 13.8 13.0 - 17.0 g/dL   HCT 40.4 39.0 - 52.0 %   MCV 101.8 (H) 80.0 - 100.0 fL   MCH 34.8 (H) 26.0 - 34.0 pg   MCHC 34.2 30.0 - 36.0 g/dL   RDW 13.7 11.5 - 15.5 %   Platelets 69 (L) 150 - 400 K/uL    Comment: Immature Platelet Fraction may be clinically  indicated, consider ordering this additional test [id number]    nRBC 0.0 0.0 - 0.2 %   Neutrophils Relative % 67 %   Neutro Abs 2.7 1.7 - 7.7 K/uL   Lymphocytes Relative 20 %   Lymphs Abs 0.8 0.7 - 4.0 K/uL   Monocytes Relative 10 %   Monocytes Absolute 0.4 0.1 - 1.0 K/uL   Eosinophils Relative 2 %   Eosinophils Absolute 0.1 0.0 - 0.5 K/uL   Basophils Relative 1 %   Basophils Absolute 0.0 0.0 - 0.1 K/uL   Immature Granulocytes 0 %   Abs Immature Granulocytes 0.01 0.00 - 0.07 K/uL    Comment: Performed at [facility], [address], [place], [place] [address]  Comprehensive metabolic panel     Status: Abnormal   Collection Time: 10/03/21 11:48 AM  Result Value Ref Range   Sodium 133 (L) 135 - 145 mmol/L   Potassium 4.8 3.5 - 5.1 mmol/L   Chloride 105 98 - 111 mmol/L   CO2 25 22 - 32 mmol/L   Glucose, Bld 236 (H) 70 - 99 mg/dL    Comment: Glucose reference range applies only to samples taken after fasting for at least 8 hours.   BUN 17 8 - 23 mg/dL   Creatinine, Ser 1.24 0.61 - 1.24 mg/dL   Calcium 8.8 (L) 8.9 - 10.3 mg/dL   Total Protein 6.8 6.5 - 8.1 g/dL   Albumin 3.1 (L) 3.5 - 5.0 g/dL   AST 48 (H) 15 - 41 U/L   ALT 34 0 - 44 U/L   Alkaline Phosphatase 138 (H) 38 - 126 U/L   Total Bilirubin 0.8 0.3 - 1.2 mg/dL   GFR, Estimated 60 (L) >60 mL/min    Comment: (NOTE) Calculated using the CKD-EPI Creatinine Equation (2021)    Anion gap 3 (L) 5 - 15    Comment: Performed at [facility], [address], [place], [place] [address]  Protime-INR     Status: None   Collection Time: 10/03/21 12:47 PM  Result Value Ref Range   Prothrombin Time 13.8 11.4 - 15.2 seconds   INR 1.1 0.8 - 1.2    Comment: (NOTE) INR goal varies based on device and disease states. Performed at [facility], [address] [place], [place] [address]    CT ABDOMEN PELVIS W CONTRAST  Result Date: 10/03/2021 CLINICAL DATA:  Bleeding from ileostomy. Weakness. Personal history of colorectal carcinoma.  EXAM: CT ABDOMEN AND PELVIS WITH CONTRAST TECHNIQUE: Multidetector CT imaging of the abdomen and pelvis was performed using the standard protocol following bolus administration of intravenous contrast. RADIATION DOSE REDUCTION: This exam was performed according to the departmental dose-optimization program  which includes automated exposure control, adjustment of the mA and/or kV according to patient size and/or use of iterative reconstruction technique. CONTRAST:  15mL OMNIPAQUE IOHEXOL 300 MG/ML  SOLN COMPARISON:  11/26/2020 FINDINGS: Lower Chest: No acute findings. Hepatobiliary: No hepatic masses identified. Prior cholecystectomy. No evidence of biliary obstruction. Pancreas:  No mass or inflammatory changes. Spleen: Within normal limits in size and appearance. Adrenals/Urinary Tract: No masses identified. No evidence of ureteral calculi or hydronephrosis. Stomach/Bowel: Prior total colectomy with right lower quadrant ileostomy again seen. Stable ill-defined soft tissue density and calcifications in the presacral space, consistent with post treatment changes. Ileostomy site and small-bowel are unremarkable in appearance. Vascular/Lymphatic: No pathologically enlarged lymph nodes. No acute vascular findings. Aortic atherosclerotic calcification noted. Reproductive:  No mass or other significant abnormality. Other:  None. Musculoskeletal:  No suspicious bone lesions identified. IMPRESSION: No acute findings. Stable post treatment changes in presacral space. No evidence of recurrent or metastatic carcinoma. Aortic Atherosclerosis (ICD10-I70.0). Electronically Signed   By: Marlaine Hind M.D.   On: 10/03/2021 13:28    Pending Labs Unresulted Labs (From admission, onward)     Start     Ordered   10/03/21 1448  Hemoglobin A1c  Once,   R       Comments: To assess prior glycemic control    10/03/21 1447            Vitals/Pain Today's Vitals   10/03/21 1300 10/03/21 1330 10/03/21 1400 10/03/21 1430  BP:  139/69 131/70 (!) 124/108 139/64  Pulse: (!) 48 (!) 56 (!) 55 (!) 48  Resp: (!) 23 16 (!) 21 (!) 21  Temp:      TempSrc:      SpO2: 100% 98% 100% 98%  Weight:      PainSc:        Isolation Precautions No active isolations  Medications Medications  insulin aspart (novoLOG) injection 0-5 Units (has no administration in time range)  insulin aspart (novoLOG) injection 0-6 Units (has no administration in time range)  sodium chloride 0.9 % bolus 250 mL (0 mLs Intravenous Stopped 10/03/21 1441)  pantoprazole (PROTONIX) injection 40 mg (40 mg Intravenous Given 10/03/21 1249)  iohexol (OMNIPAQUE) 300 MG/ML solution 100 mL (100 mLs Intravenous Contrast Given 10/03/21 1303)    Mobility walks Low fall risk   Focused Assessments    R Recommendations: See Admitting Provider Note  Report given to:   Additional Notes:

## 2021-10-03 NOTE — H&P (Addendum)
Patient Demographics:    Jose Lawrence, is a 81 y.o. male  MRN: [id number]   DOB - 12/28/1940  Admit Date - 10/03/2021  Outpatient Primary MD for the patient is Jose Chroman, MD   Assessment & Plan:   Assessment and Plan:  1) acute GI bleed-- -Hgb is 13.8 down from recent level of 17.3 about 2 weeks ago -[facility] and MCV remains elevated -Platelets remain low at 69, INR is 1.1 --CT abdomen and pelvis without acute findings, EDP discussed case with on-call GI attending Dr. Laural Lawrence who recommended observation for possible colonoscopy in a.m. -No nausea no vomiting -IV Protonix, clear liquid diet, further GI evaluation pending  2) chronic atrial fibrillation----continue metoprolol for rate control -Patient has chronic thrombocytopenia--so Patient is not on anticoagulants  3) chronic hyponatremia----Sodium is 133 which is not a new finding -Maintain adequate hydration  4)DM2-previously uncontrolled, no recent A1c -Hold glipizide while n.p.o. Use Novolog/Humalog Sliding scale insulin with Accu-Cheks/Fingersticks as ordered   5) hypothyroidism--continue levothyroxine 50 mg daily  6) BPH--- continue Flomax  7) mild LFT elevation----AST is 48 with ALT of 34 and alk phos of 138 with T. 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 -Recurrence in February 2009, had 6 months of FOLFOX with radiation - status post total colectomy with ileostomy--- patient has  had chemo and radiation, patient last saw  Dr. Delton Lawrence on 09/21/21  Disposition/Need for in-Hospital Stay- patient unable to be discharged at this time due to concerns for GI bleed requiring endoluminal evaluation  Dispo: The patient is from: Home              Anticipated d/c is to: Home              Anticipated d/c date is: 1 day              Patient currently is not medically stable to d/c. Barriers: Not Clinically Stable-   With History of - Reviewed by me  Past Medical History:  Diagnosis Date   Anemia    Causing interruption in anticoagulation   Atrial fibrillation and flutter (HCC)    BPH (benign prostatic hypertrophy)    Colon cancer ([place])    Status post LAR 2008   Hypothyroidism    Lacunar infarction ([place]) 11/2012   RT 3rd NERVE PALSY, SB Dr. Iona Lawrence   PE (pulmonary embolism)    Temporarily on Eliquis 2017   Skin cancer    35 years ago   Type 2 diabetes mellitus ([place])    Vitamin B 12 deficiency 07/10/2019      Past Surgical History:  Procedure Laterality Date   ABDOMINOPERINEAL PROCTOCOLECTOMY  2009   end colostomy    BIOPSY  08/25/2018   Procedure: BIOPSY;  Surgeon: Jose Houston, MD;  [facility];  Service: Endoscopy;;   BLADDER REPAIR  08/29/2018   Procedure: BLADDER REPAIR;  Surgeon: Jose Cagey, MD;  Location: [facility] ORS;  Service: General;;   COLECTOMY  2008   COLONOSCOPY WITH PROPOFOL N/A 08/25/2018   Procedure: COLONOSCOPY WITH PROPOFOL;  Surgeon: Jose Houston, MD;  Location: [facility];  Service: Endoscopy;  Laterality: N/A;  2:20   COLOSTOMY     EXPLORATORY LAPAROTOMY  2017   ? pneumoperitoneum of unknown etiology/ negative Ex lap   ILEOSTOMY  08/29/2018   Procedure: ILEOSTOMY;  Surgeon: Jose Cagey, MD;  Location: [facility] OR[facility];  Service: General;;   LAPAROSCOPIC CHOLECYSTECTOMY  2007   LYSIS OF ADHESION  08/29/2018   Procedure: LYSIS OF ADHESION;  Surgeon: Jose Cagey, MD;  Location: [facility];  Service: General;;    PARTIAL COLECTOMY N/A 08/29/2018   Procedure: PARTIAL COLECTOMY;  Surgeon: Jose Cagey, MD;  Location: [facility];  Service: General;  Laterality: N/A;   PORTACATH PLACEMENT Left 10/04/2018   Procedure: INSERTION PORT-A-CATH (attached catheter left subclavian);  Surgeon: Jose Cagey, MD;  Location: [facility];  Service: General;  Laterality: Left;   SBO SURGERY  06/2009      Chief Complaint  Patient presents with   GI Problem    Blood in colostomy bag      HPI:    Jose Lawrence  is a 80 y.o. male with PMHX relevant for chronic atrial fibrillation, HTN, CKD stage IIIA, colon adenocarcinoma status post total colectomy with ileostomy, diabetes mellitus type 2, BPH, hypothyroidism, post-operative PE 2017 who presents to the ED with concerns about frank blood in his ileostomy bag -According to patient's wife patient has had increasing weakness over the last week - -CT abdomen and pelvis without acute findings, EDP discussed case with on-call GI attending Dr. Laural Lawrence who recommended observation for possible colonoscopy in a.m. -No nausea no vomiting no fevers no chills no chest pain palpitation -In the ED creatinine is 1.24 which is better than previous baseline -Sodium is 133 which is not a new finding -AST is 48 with ALT of 34 and alk phos of 138 with T. bili of 0.8 -Hgb is 13.8 down from recent level of 17.32 weeks ago -MCH and MCV remains elevated -Platelets remain low at 69, INR is 1.1 - Additional history obtained from pts daughter Jose Lawrence and pts wife at bedside   Review of systems:    In addition to the HPI above,   A full Review of  Systems was done, all other systems reviewed are negative except as noted above in HPI , .    Social History:  Reviewed by me    Social History   Tobacco Use   Smoking status: Former    Packs/day: 1.00    Years: 27.00    Pack years: 27.00    Types: Cigarettes    Start date: 01/27/1964    Quit date: 01/27/1991    Years since  quitting: 30.7   Smokeless tobacco: Former    Types: Chew    Quit date: 08/10/1995  Substance Use Topics   Alcohol use: Never    Alcohol/week: 0.0 standard drinks       Family History :  Reviewed by me    Family History  Problem Relation Age  of Onset   Heart disease Father    Heart failure Father    Heart attack Father    Prostate cancer Paternal Uncle      Home Medications:   Prior to Admission medications   Medication Sig Start Date End Date Taking? Authorizing Provider  ergocalciferol (VITAMIN D2) 1.25 MG (50000 UT) capsule Take 1 capsule (50,000 Units total) by mouth once a week. 09/19/20  Yes Doreatha Massed, MD  ferrous sulfate (FERROUSUL) 325 (65 FE) MG tablet Take 1 tablet (325 mg total) by mouth 2 (two) times daily with a meal. 07/17/19  Yes Doreatha Massed, MD  glipiZIDE (GLUCOTROL) 10 MG tablet Take 10 mg by mouth 2 (two) times daily. 07/05/18  Yes [provider]  lactose free nutrition (BOOST) LIQD Take 237 mLs by mouth 3 (three) times daily between meals.   Yes [provider]  levothyroxine (SYNTHROID, LEVOTHROID) 50 MCG tablet Take 50 mcg by mouth daily before breakfast.   Yes [provider]  loperamide (IMODIUM) 2 MG capsule Take 2 capsules (4 mg total) by mouth 4 (four) times daily -  before meals and at bedtime. 11/11/18  Yes Lucretia Roers, MD  metoprolol tartrate (LOPRESSOR) 25 MG tablet Take 1 tablet (25 mg total) by mouth 2 (two) times daily. 09/04/18  Yes Erick Blinks, MD  tamsulosin (FLOMAX) 0.4 MG CAPS capsule Take 0.4 mg by mouth at bedtime.    Yes [provider]     Allergies:    No Known Allergies   Physical Exam:   Vitals  Blood pressure (!) 154/75, pulse (!) 52, temperature (!) 97.5 F (36.4 C), temperature source Oral, resp. rate 18, height 5' 11.5" (1.816 m), weight 86.2 kg, SpO2 99 %.  Physical Examination: General appearance - alert,  and in no distress  Mental status - alert, oriented  to person, place, and time,  Eyes - sclera anicteric Neck - supple, no JVD elevation , Chest - clear  to auscultation bilaterally, symmetrical air movement,  Heart - S1 and S2 normal, irregular , left subclavian area with Port-A-Cath in situ Abdomen - soft, nontender, nondistended, right lower quadrant ostomy bag with dark brown contents  neurological - screening mental status exam normal, neck supple without rigidity, cranial nerves II through XII intact, DTR's normal and symmetric Extremities - no pedal edema noted, intact peripheral pulses  Skin - warm, dry   Data Review:    CBC Recent Labs  Lab 10/03/21 1148  WBC 4.0  HGB 13.8  HCT 40.4  PLT 69*  MCV 101.8*  MCH 34.8*  MCHC 34.2  RDW 13.7  LYMPHSABS 0.8  MONOABS 0.4  EOSABS 0.1  BASOSABS 0.0   ------------------------------------------------------------------------------------------------------------------  Chemistries  Recent Labs  Lab 10/03/21 1148  NA 133*  K 4.8  CL 105  CO2 25  GLUCOSE 236*  BUN 17  CREATININE 1.24  CALCIUM 8.8*  AST 48*  ALT 34  ALKPHOS 138*  BILITOT 0.8   ------------------------------------------------------------------------------------------------------------------ estimated creatinine clearance is 53.1 mL/min (by C-G formula based on SCr of 1.24 mg/dL). ------------------------------------------------------------------------------------------------------------------ No results for input(s): TSH, T4TOTAL, T3FREE, THYROIDAB in the last 72 hours.  Invalid input(s): FREET3   Coagulation profile Recent Labs  Lab 10/03/21 1247  INR 1.1   ------------------------------------------------------------------------------------------------------------------- No results for input(s): DDIMER in the last 72 hours. --------------------------------------------------------------------------------------------  Urinalysis    Component Value Date/Time   COLORURINE YELLOW 12/23/2020 0258    APPEARANCEUR CLEAR 12/23/2020 0258   APPEARANCEUR Turbid (A) 09/14/2018 1514  LABSPEC >1.030 (H) 12/23/2020 0258   PHURINE 5.0 12/23/2020 0258   GLUCOSEU NEGATIVE 12/23/2020 0258   HGBUR NEGATIVE 12/23/2020 0258   BILIRUBINUR NEGATIVE 12/23/2020 0258   BILIRUBINUR Negative 09/14/2018 [place] 12/23/2020 0258   PROTEINUR NEGATIVE 12/23/2020 0258   NITRITE NEGATIVE 12/23/2020 0258   LEUKOCYTESUR NEGATIVE 12/23/2020 0258     Imaging Results:    CT ABDOMEN PELVIS W CONTRAST  Result Date: 10/03/2021 CLINICAL DATA:  Bleeding from ileostomy. Weakness. Personal history of colorectal carcinoma. EXAM: CT ABDOMEN AND PELVIS WITH CONTRAST TECHNIQUE: Multidetector CT imaging of the abdomen and pelvis was performed using the standard protocol following bolus administration of intravenous contrast. RADIATION DOSE REDUCTION: This exam was performed according to the departmental dose-optimization program which includes automated exposure control, adjustment of the mA and/or kV according to patient size and/or use of iterative reconstruction technique. CONTRAST:  115mL OMNIPAQUE IOHEXOL 300 MG/ML  SOLN COMPARISON:  11/26/2020 FINDINGS: Lower Chest: No acute findings. Hepatobiliary: No hepatic masses identified. Prior cholecystectomy. No evidence of biliary obstruction. Pancreas:  No mass or inflammatory changes. Spleen: Within normal limits in size and appearance. Adrenals/Urinary Tract: No masses identified. No evidence of ureteral calculi or hydronephrosis. Stomach/Bowel: Prior total colectomy with right lower quadrant ileostomy again seen. Stable ill-defined soft tissue density and calcifications in the presacral space, consistent with post treatment changes. Ileostomy site and small-bowel are unremarkable in appearance. Vascular/Lymphatic: No pathologically enlarged lymph nodes. No acute vascular findings. Aortic atherosclerotic calcification noted. Reproductive:  No mass or other  significant abnormality. Other:  None. Musculoskeletal:  No suspicious bone lesions identified. IMPRESSION: No acute findings. Stable post treatment changes in presacral space. No evidence of recurrent or metastatic carcinoma. Aortic Atherosclerosis (ICD10-I70.0). Electronically Signed   By: Marlaine Hind M.D.   On: 10/03/2021 13:28    Radiological Exams on Admission: CT ABDOMEN PELVIS W CONTRAST  Result Date: 10/03/2021 CLINICAL DATA:  Bleeding from ileostomy. Weakness. Personal history of colorectal carcinoma. EXAM: CT ABDOMEN AND PELVIS WITH CONTRAST TECHNIQUE: Multidetector CT imaging of the abdomen and pelvis was performed using the standard protocol following bolus administration of intravenous contrast. RADIATION DOSE REDUCTION: This exam was performed according to the departmental dose-optimization program which includes automated exposure control, adjustment of the mA and/or kV according to patient size and/or use of iterative reconstruction technique. CONTRAST:  147mL OMNIPAQUE IOHEXOL 300 MG/ML  SOLN COMPARISON:  11/26/2020 FINDINGS: Lower Chest: No acute findings. Hepatobiliary: No hepatic masses identified. Prior cholecystectomy. No evidence of biliary obstruction. Pancreas:  No mass or inflammatory changes. Spleen: Within normal limits in size and appearance. Adrenals/Urinary Tract: No masses identified. No evidence of ureteral calculi or hydronephrosis. Stomach/Bowel: Prior total colectomy with right lower quadrant ileostomy again seen. Stable ill-defined soft tissue density and calcifications in the presacral space, consistent with post treatment changes. Ileostomy site and small-bowel are unremarkable in appearance. Vascular/Lymphatic: No pathologically enlarged lymph nodes. No acute vascular findings. Aortic atherosclerotic calcification noted. Reproductive:  No mass or other significant abnormality. Other:  None. Musculoskeletal:  No suspicious bone lesions identified. IMPRESSION: No acute  findings. Stable post treatment changes in presacral space. No evidence of recurrent or metastatic carcinoma. Aortic Atherosclerosis (ICD10-I70.0). Electronically Signed   By: Marlaine Hind M.D.   On: 10/03/2021 13:28    DVT Prophylaxis -SCD AM Labs Ordered, also please review Full Orders  Family Communication: Admission, patients condition and plan of care including tests being ordered have been discussed with the patient  who indicate understanding and  agree with the plan   Code Status - Full Code  Likely DC to  home  Condition-stable  Roxan Hockey M.D on 10/03/2021 at 5:27 PM Go to [email or web address] -  for contact info  [facility] - Office  [phone]

## 2021-10-04 ENCOUNTER — Observation Stay (HOSPITAL_COMMUNITY): Payer: Medicare Other | Admitting: Anesthesiology

## 2021-10-04 ENCOUNTER — Encounter (HOSPITAL_COMMUNITY)
Admission: EM | Disposition: A | Discharge status: Home / Self Care | Payer: Self-pay | Source: Home / Self Care | Attending: Emergency Medicine

## 2021-10-04 DIAGNOSIS — K9411 Enterostomy hemorrhage: Secondary | ICD-10-CM | POA: Diagnosis not present

## 2021-10-04 DIAGNOSIS — E039 Hypothyroidism, unspecified: Secondary | ICD-10-CM | POA: Diagnosis not present

## 2021-10-04 DIAGNOSIS — K6389 Other specified diseases of intestine: Secondary | ICD-10-CM | POA: Diagnosis not present

## 2021-10-04 DIAGNOSIS — C184 Malignant neoplasm of transverse colon: Secondary | ICD-10-CM | POA: Diagnosis not present

## 2021-10-04 DIAGNOSIS — I4891 Unspecified atrial fibrillation: Secondary | ICD-10-CM | POA: Diagnosis not present

## 2021-10-04 DIAGNOSIS — K922 Gastrointestinal hemorrhage, unspecified: Secondary | ICD-10-CM | POA: Diagnosis not present

## 2021-10-04 DIAGNOSIS — K5710 Diverticulosis of small intestine without perforation or abscess without bleeding: Secondary | ICD-10-CM | POA: Diagnosis not present

## 2021-10-04 DIAGNOSIS — K5989 Other specified functional intestinal disorders: Secondary | ICD-10-CM | POA: Diagnosis not present

## 2021-10-04 DIAGNOSIS — C189 Malignant neoplasm of colon, unspecified: Secondary | ICD-10-CM | POA: Diagnosis not present

## 2021-10-04 HISTORY — PX: ILEOSCOPY: SHX5434

## 2021-10-04 HISTORY — PX: GIVENS CAPSULE STUDY: SHX5432

## 2021-10-04 HISTORY — PX: BIOPSY: SHX5522

## 2021-10-04 LAB — BASIC METABOLIC PANEL
Anion gap: 7 (ref 5–15)
BUN: 13 mg/dL (ref 8–23)
CO2: 26 mmol/L (ref 22–32)
Calcium: 9.1 mg/dL (ref 8.9–10.3)
Chloride: 104 mmol/L (ref 98–111)
Creatinine, Ser: 1.08 mg/dL (ref 0.61–1.24)
GFR, Estimated: >60 mL/min (ref >60)
Glucose, Bld: 85 mg/dL (ref 70–99)
Potassium: 3.7 mmol/L (ref 3.5–5.1)
Sodium: 137 mmol/L (ref 135–145)

## 2021-10-04 LAB — GLUCOSE, CAPILLARY
Glucose-Capillary: 91 mg/dL (ref 70–99)
Glucose-Capillary: 106 mg/dL — ABNORMAL HIGH (ref 70–99)
Glucose-Capillary: 114 mg/dL — ABNORMAL HIGH (ref 70–99)
Glucose-Capillary: 92 mg/dL (ref 70–99)

## 2021-10-04 LAB — CBC
HCT: 41.3 % (ref 39.0–52.0)
Hemoglobin: 13.6 g/dL (ref 13.0–17.0)
MCH: 33.1 pg (ref 26.0–34.0)
MCHC: 32.9 g/dL (ref 30.0–36.0)
MCV: 100.5 fL — ABNORMAL HIGH (ref 80.0–100.0)
Platelets: 81 10*3/uL — ABNORMAL LOW (ref 150–400)
RBC: 4.11 MIL/uL — ABNORMAL LOW (ref 4.22–5.81)
RDW: 13.5 % (ref 11.5–15.5)
WBC: 5.6 10*3/uL (ref 4.0–10.5)
nRBC: 0 % (ref 0.0–0.2)

## 2021-10-04 SURGERY — ILEOSCOPY, THROUGH STOMA
Anesthesia: General

## 2021-10-04 MED ORDER — PROPOFOL 10 MG/ML IV BOLUS
INTRAVENOUS | Status: AC
  Filled 2021-10-04: qty 20

## 2021-10-04 MED ORDER — LIDOCAINE HCL (PF) 2 % IJ SOLN
INTRAMUSCULAR | Status: AC
  Filled 2021-10-04: qty 5

## 2021-10-04 MED ORDER — PANTOPRAZOLE SODIUM 40 MG PO TBEC: 40.0000 mg | DELAYED_RELEASE_TABLET | Freq: Every day | ORAL | 1 refills | Status: DC

## 2021-10-04 MED ORDER — PROPOFOL 10 MG/ML IV BOLUS
INTRAVENOUS | Status: DC | PRN
  Administered 2021-10-04 (×4): 20 mg via INTRAVENOUS
  Administered 2021-10-04: 80 mg via INTRAVENOUS
  Administered 2021-10-04: 20 mg via INTRAVENOUS

## 2021-10-04 MED ORDER — FERROUS SULFATE 325 (65 FE) MG PO TABS: 325.0000 mg | ORAL_TABLET | Freq: Two times a day (BID) | ORAL | 3 refills | Status: DC

## 2021-10-04 MED ORDER — LACTATED RINGERS IV SOLN: INTRAVENOUS | Status: DC | PRN

## 2021-10-04 MED ORDER — TAMSULOSIN HCL 0.4 MG PO CAPS: 0.4000 mg | ORAL_CAPSULE | Freq: Every day | ORAL | 3 refills | Status: DC

## 2021-10-04 MED ORDER — METOPROLOL TARTRATE 25 MG PO TABS: 12.5000 mg | ORAL_TABLET | Freq: Two times a day (BID) | ORAL | 2 refills | Status: DC

## 2021-10-04 MED ORDER — PROPOFOL 500 MG/50ML IV EMUL
INTRAVENOUS | Status: DC | PRN
  Administered 2021-10-04: 150 ug/kg/min via INTRAVENOUS

## 2021-10-04 MED ORDER — PHENYLEPHRINE 40 MCG/ML (10ML) SYRINGE FOR IV PUSH (FOR BLOOD PRESSURE SUPPORT)
PREFILLED_SYRINGE | INTRAVENOUS | Status: DC | PRN
  Administered 2021-10-04: 40 ug via INTRAVENOUS

## 2021-10-04 MED ORDER — HYDROCORTISONE (PERIANAL) 2.5 % EX CREA: 1.0000 "application " | TOPICAL_CREAM | Freq: Two times a day (BID) | CUTANEOUS | 0 refills | Status: DC

## 2021-10-04 MED ORDER — LIDOCAINE HCL (CARDIAC) PF 100 MG/5ML IV SOSY
PREFILLED_SYRINGE | INTRAVENOUS | Status: DC | PRN
  Administered 2021-10-04: 60 mg via INTRATRACHEAL

## 2021-10-04 NOTE — Anesthesia Postprocedure Evaluation (Signed)
Anesthesia Post Note  Patient: Jose Lawrence  Procedure(s) Performed: ILEOSCOPY THROUGH STOMA BIOPSY  Patient location during evaluation: PACU Anesthesia Type: General Level of consciousness: awake and alert and oriented Pain management: pain level controlled Vital Signs Assessment: post-procedure vital signs reviewed and stable Respiratory status: spontaneous breathing, nonlabored ventilation and respiratory function stable Cardiovascular status: blood pressure returned to baseline and stable Postop Assessment: no apparent nausea or vomiting Anesthetic complications: no   No notable events documented.   Last Vitals:  Vitals:   10/04/21 1300 10/04/21 1315  BP: 128/66 (!) 142/72  Pulse: (!) 59 60  Resp: 16 16  Temp: 36.4 C   SpO2: 100% 100%    Last Pain:  Vitals:   10/04/21 1315  TempSrc:   PainSc: 0-No pain                 Kriss Ishler C Sachi Boulay

## 2021-10-04 NOTE — Anesthesia Preprocedure Evaluation (Addendum)
Anesthesia Evaluation  Patient identified by MRN, date of birth, ID band Patient awake    Reviewed: Allergy & Precautions, NPO status , Patient's Chart, lab work & pertinent test results, reviewed documented beta blocker date and time   Airway Mallampati: II  TM Distance: >3 FB Neck ROM: Full    Dental  (+) Edentulous Upper, Edentulous Lower   Pulmonary former smoker, PE   Pulmonary exam normal breath sounds clear to auscultation       Cardiovascular Exercise Tolerance: Good hypertension, Pt. on medications and Pt. on home beta blockers Normal cardiovascular exam+ dysrhythmias Atrial Fibrillation  Rhythm:Regular Rate:Normal     Neuro/Psych  Neuromuscular disease CVA negative psych ROS   GI/Hepatic Neg liver ROS, Bowel prep,Colon cancer,ileostomy in place   Endo/Other  diabetes, Well Controlled, Type 2, Oral Hypoglycemic AgentsHypothyroidism   Renal/GU Renal InsufficiencyRenal disease  negative genitourinary   Musculoskeletal  (+) Arthritis ,   Abdominal   Peds negative pediatric ROS (+)  Hematology  (+) Blood dyscrasia, anemia ,   Anesthesia Other Findings Back pain  Reproductive/Obstetrics negative OB ROS                          Anesthesia Physical Anesthesia Plan  ASA: 3  Anesthesia Plan: General   Post-op Pain Management: Minimal or no pain anticipated   Induction: Intravenous  PONV Risk Score and Plan: TIVA  Airway Management Planned: Nasal Cannula and Natural Airway  Additional Equipment:   Intra-op Plan:   Post-operative Plan:   Informed Consent: I have reviewed the patients History and Physical, chart, labs and discussed the procedure including the risks, benefits and alternatives for the proposed anesthesia with the patient or authorized representative who has indicated his/her understanding and acceptance.     Dental advisory given  Plan Discussed with: CRNA and  Surgeon  Anesthesia Plan Comments:        Anesthesia Quick Evaluation

## 2021-10-04 NOTE — TOC Progression Note (Signed)
Transition of Care [facility]) - Progression Note    Patient Details  Name: Jose Lawrence MRN: [id number] Date of Birth: Nov 18, 1940  Transition of Care [facility]) CM/SW Contact  Salome Arnt, [place] Phone Number: 10/04/2021, 2:47 PM  Clinical Narrative:   Transition of Care [facility]) Screening Note   Patient Details  Name: Jose Lawrence Date of Birth: 06-19-1941   Transition of Care [facility]) CM/SW Contact:    Salome Arnt, [place] Phone Number: 10/04/2021, 2:48 PM    Transition of Care Department [facility]) has reviewed patient and no TOC needs have been identified at this time. We will continue to monitor patient advancement through interdisciplinary progression rounds. If new patient transition needs arise, please place a TOC consult.         Barriers to Discharge: Barriers Resolved  Expected Discharge Plan and Services           Expected Discharge Date: 10/04/21                                     Social Determinants of Health (SDOH) Interventions    Readmission Risk Interventions No flowsheet data found.

## 2021-10-04 NOTE — Discharge Summary (Addendum)
Jose Lawrence, is a 81 y.o. male  DOB Jan 01, 1941  MRN [id number].  Admission date:  10/03/2021  Admitting Physician  Roxan Hockey, MD  Discharge Date:  10/04/2021   Primary MD  Glenda Chroman, MD  Recommendations for primary care physician for things to follow:   1)Avoid Red or Pink colored food items or drinks for the next week or so 2)Hold Metoprolol 12.5 mg  if Heart Rate is less than 70 bpm 3)Use Anusol HC--apply hydrocortisone cream sparingly to ostomy area twice daily 4)Avoid ibuprofen/Advil/Aleve/Motrin/Goody Powders/Naproxen/BC powders/Meloxicam/Diclofenac/Indomethacin and other Nonsteroidal anti-inflammatory medications as these will make you more likely to bleed and can cause stomach ulcers, can also cause Kidney problems.  5)Follow up with Dr. Laural Golden -- address [address], [address], [place] [address],,Phone Number [phone] as advised for recheck  6)Repeat CBC and BMP blood test in about a week with your primary care physician 7)Consider referral to cardiologist Dr. Burt Knack to discuss possible Watchman device due to atrial fibrillation and your inability to take blood thinners  Admission Diagnosis  Acute GI bleeding [K92.2] Ileostomy care [facility]) [Z43.2]   Discharge Diagnosis  Acute GI bleeding [K92.2] Ileostomy care [facility]) [Z43.2]    Principal Problem:   Acute GI bleeding Active Problems:   Hypothyroidism   Colostomy in place [facility])   BPH (benign prostatic hyperplasia)   Atrial fibrillation, chronic ([place])   Colon cancer ([place])   Uncontrolled type 2 diabetes mellitus with hyperglycemia, without long-term current use of insulin ([place])      Past Medical History:  Diagnosis Date   Anemia    Causing interruption in anticoagulation   Atrial fibrillation and flutter (H[facility])    BPH (benign prostatic hypertrophy)    Colon cancer ([place])    Status post LAR 2008   Hypothyroidism     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   PE (pulmonary embolism)    Temporarily on Eliquis 2017   Skin cancer    35 years ago   Type 2 diabetes mellitus ([place])    Vitamin B 12 deficiency 07/10/2019    Past Surgical History:  Procedure Laterality Date   ABDOMINOPERINEAL PROCTOCOLECTOMY  2009   end colostomy    BIOPSY  08/25/2018   Procedure: BIOPSY;  Surgeon: Rogene Houston, MD;  Location[facility] END[facility];  Service: Endoscopy;;   BLADDER REPAIR  08/29/2018   Procedure: BLADDER REPAIR;  Surgeon: [name], MD;  Location: [facility] ORS;  Service: General;;   COLECTOMY  2008   COLONOSCOPY WITH PROPOFOL N/A 08/25/2018   Procedure: COLONOSCOPY WITH PROPOFOL;  Surgeon: Rogene Houston, MD;  Location: [facility] END[facility];  Service: Endoscopy;  Laterality: N/A;  2:20   COLOSTOMY     EXPLORATORY LAPAROTOMY  2017   ? pneumoperitoneum of unknown etiology/ negative Ex lap   ILEOSTOMY  08/29/2018   Procedure: ILEOSTOMY;  Surgeon: [name], MD;  Location: [facility] ORS;  Service: General;;   LAPAROSCOPIC CHOLECYSTECTOMY  2007  LYSIS OF ADHESION  08/29/2018   Procedure: LYSIS OF ADHESION;  Surgeon: [name], MD;  Location: [facility] ORS;  Service: General;;   PARTIAL COLECTOMY N/A 08/29/2018   Procedure: PARTIAL COLECTOMY;  Surgeon: [name], MD;  Location: [facility] ORS;  Service: General;  Laterality: N/A;   PORTACATH PLACEMENT Left 10/04/2018   Procedure: INSERTION PORT-A-CATH (attached catheter left subclavian);  Surgeon: [name], MD;  Location: [facility] ORS;  Service: General;  Laterality: Left;   SBO SURGERY  06/2009       HPI  from the history and physical done on the day of admission:      Jose Lawrence  is a 81 y.o. 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 Laural Golden who recommended observation for possible colonoscopy in a.m. -No nausea no vomiting no fevers no chills no chest pain palpitation -In the ED creatinine is 1.24 which is better than previous baseline -Sodium is 133 which is not a new finding -AST is 48 with ALT of 34 and alk phos of 138 with T. bili of 0.8 -Hgb is 13.8 down from recent level of 17.32 weeks ago -MCH and MCV remains elevated -Platelets remain low at 69, INR is 1.1 - Additional history obtained from pts daughter [name] and pts wife at [facility] Course:    Assessment and Plan: 1) acute GI bleed-- -Hgb 13.8 >> 13.6 (baseline 14 to 17) -MCH and MCV remains elevated -Platelets  69 >>81, INR is 1.1 --CT abdomen and pelvis without acute findings,  -No nausea no vomiting -GI consult appreciated -On 10/04/2021 patient underwent colonoscopy with finding of edema, erythema and friability of the mucosa on and ostomy,  Congested erythematous mucosa at ileostomy stoma and Congested mucosa in the distal ileum -Video capsule endoscopy was performed -Outpatient follow-up with GI service advised   2) chronic atrial fibrillation----patient has been persistently bradycardic with heart rate dipping into the 30s --decrease metoprolol to 12.5 mg twice daily with hold parameters due to concerns for persistent bradycardia -Patient has chronic thrombocytopenia--so Patient is not on anticoagulants -Follow-up with cardiology to see if patient is a candidate for Watchman device   3) chronic hyponatremia----Sodium is 133 which is not a new finding -Sodium is up to 137 at this time   4)DM2-A1c is 8.2 reflecting uncontrolled diabetes with hyperglycemia -Okay to resume glipizide   5)  hypothyroidism--continue levothyroxine 50 mg daily   6) BPH--- continue Flomax   7) mild LFT elevation----AST is 48 with ALT of 34 and alk phos of 138 with T. bili of 0.8 -CT abdomen and pelvis without acute finding -Outpatient follow-up with GI advised   8) AKI--- -On admission creatinine was 1.24  which is better than previous baseline -Upon discharge creatinine is 1.0 -AKI appears to have resolved -Upon further evaluation it appears that patient may not have CKD 3A  , patient just had an AKI  renally adjust medications, avoid nephrotoxic agents / dehydration  / hypotension   9)Stage IIb (T4N0) transverse colon adenocarcinoma, MSI stable: -History of colon cancer in 2008, status post resection, did not receive  any adjuvant chemo. -Recurrence in February 2009, had 6 months of FOLFOX with radiation - status post total colectomy with ileostomy--- patient has had chemo and radiation, patient last saw  Dr. Delton Coombes on 09/21/21   Disposition/--discharge home with family   Dispo: The patient is from: Home              Anticipated d/c is to: Home  Discharge Condition: stable  Follow UP   Follow-up Information     [name], [name], MD. Schedule an appointment as soon as possible for a visit.   Specialty: Gastroenterology Contact information: [address], [address] [place] [address] [phone]                  Consults obtained - Gi  Diet and Activity recommendation:  As advised  Discharge Instructions  Discharge Instructions     Call MD for:  difficulty breathing, headache or visual disturbances   Complete by: As directed    Call MD for:  persistant dizziness or light-headedness   Complete by: As directed    Call MD for:  persistant nausea and vomiting   Complete by: As directed    Call MD for:  severe uncontrolled pain   Complete by: As directed    Call MD for:  temperature >100.4   Complete by: As directed    Diet - low sodium heart healthy    Complete by: As directed    -Avoid Red or Pink colored food items or drinks for the next week or so   Discharge instructions   Complete by: As directed    1)Avoid Red or Pink colored food items or drinks for the next week or so 2)Hold Metoprolol 12.5 mg  if Heart Rate is less than 70 bpm 3)Use Anusol HC--apply hydrocortisone cream sparingly to ostomy area twice daily 4)Avoid ibuprofen/Advil/Aleve/Motrin/Goody Powders/Naproxen/BC powders/Meloxicam/Diclofenac/Indomethacin and other Nonsteroidal anti-inflammatory medications as these will make you more likely to bleed and can cause stomach ulcers, can also cause Kidney problems.  5)Follow up with Dr. Laural Golden -- address [address], [address], [place] [address],,SPQZR Number [phone] as advised for recheck  6)Repeat CBC and BMP blood test in about a week with your primary care physician 7)Consider referral to cardiologist Dr. Burt Knack to discuss possible Watchman device due to atrial fibrillation and your inability to take blood thinners   Increase activity slowly   Complete by: As directed          Discharge Medications     Allergies as of 10/04/2021   No Known Allergies      Medication List     TAKE these medications    ergocalciferol 1.25 MG (50000 UT) capsule Commonly known as: VITAMIN D2 Take 1 capsule (50,000 Units total) by mouth once a week.   ferrous sulfate 325 (65 FE) MG tablet Commonly known as: FerrouSul Take 1 tablet (325 mg total) by mouth 2 (two) times daily with a meal. Start taking on: October 07, 2021 What changed: These instructions start on October 07, 2021. If you are unsure what to do until then, ask your doctor or other care provider.   glipiZIDE 10 MG tablet Commonly known as: GLUCOTROL Take 10 mg by mouth 2 (two) times daily.   hydrocortisone 2.5 % rectal cream Commonly known as: ANUSOL-HC Apply 1 application topically 2 (two) times daily. Apply Sparingly Twice Daily to Ostomy area   lactose  free nutrition Liqd Take 237 mLs by mouth 3 (three) times  daily between meals.   levothyroxine 50 MCG tablet Commonly known as: SYNTHROID Take 50 mcg by mouth daily before breakfast.   loperamide 2 MG capsule Commonly known as: IMODIUM Take 2 capsules (4 mg total) by mouth 4 (four) times daily -  before meals and at bedtime.   metoprolol tartrate 25 MG tablet Commonly known as: LOPRESSOR Take 0.5 tablets (12.5 mg total) by mouth 2 (two) times daily. Hold if Heart Rate < 70 bpm What changed:  how much to take additional instructions   pantoprazole 40 MG tablet Commonly known as: Protonix Take 1 tablet (40 mg total) by mouth daily.   tamsulosin 0.4 MG Caps capsule Commonly known as: FLOMAX Take 1 capsule (0.4 mg total) by mouth daily after supper. What changed: when to take this        Major procedures and Radiology Reports - PLEASE review detailed and final reports for all details, in brief -   CT ABDOMEN PELVIS W CONTRAST  Result Date: 10/03/2021 CLINICAL DATA:  Bleeding from ileostomy. Weakness. Personal history of colorectal carcinoma. EXAM: CT ABDOMEN AND PELVIS WITH CONTRAST TECHNIQUE: Multidetector CT imaging of the abdomen and pelvis was performed using the standard protocol following bolus administration of intravenous contrast. RADIATION DOSE REDUCTION: This exam was performed according to the departmental dose-optimization program which includes automated exposure control, adjustment of the mA and/or kV according to patient size and/or use of iterative reconstruction technique. CONTRAST:  163mL OMNIPAQUE IOHEXOL 300 MG/ML  SOLN COMPARISON:  11/26/2020 FINDINGS: Lower Chest: No acute findings. Hepatobiliary: No hepatic masses identified. Prior cholecystectomy. No evidence of biliary obstruction. Pancreas:  No mass or inflammatory changes. Spleen: Within normal limits in size and appearance. Adrenals/Urinary Tract: No masses identified. No evidence of ureteral calculi or  hydronephrosis. Stomach/Bowel: Prior total colectomy with right lower quadrant ileostomy again seen. Stable ill-defined soft tissue density and calcifications in the presacral space, consistent with post treatment changes. Ileostomy site and small-bowel are unremarkable in appearance. Vascular/Lymphatic: No pathologically enlarged lymph nodes. No acute vascular findings. Aortic atherosclerotic calcification noted. Reproductive:  No mass or other significant abnormality. Other:  None. Musculoskeletal:  No suspicious bone lesions identified. IMPRESSION: No acute findings. Stable post treatment changes in presacral space. No evidence of recurrent or metastatic carcinoma. Aortic Atherosclerosis (ICD10-I70.0). Electronically Signed   By: Marlaine Hind M.D.   On: 10/03/2021 13:28    Micro Results   No results found for this or any previous visit (from the past 240 hour(s)).  Today   Subjective    Jose Lawrence today has no new complaints -Tolerating oral intake well -Patient's daughter [name] and patient's wife at bedside, questions answered -No fever  Or chills   No Nausea or Vomiting           Patient has been seen and examined prior to discharge   Objective   Blood pressure (!) 147/85, pulse (!) 56, temperature 97.8 F (36.6 C), resp. rate 16, height 5' 11.5" (1.816 m), weight 86.2 kg, SpO2 99 %.   Intake/Output Summary (Last 24 hours) at 10/04/2021 1457 Last data filed at 10/04/2021 1255 Gross per 24 hour  Intake 1062.94 ml  Output --  Net 1062.94 ml    Exam Physical Examination: General appearance - alert,  and in no distress  Mental status - alert, oriented to person, place, and time,  Eyes - sclera anicteric Ears-HOH Neck - supple, no JVD elevation , Chest - clear  to auscultation bilaterally, symmetrical air movement,  Heart -  S1 and S2 normal, irregular , left subclavian area with Port-A-Cath in situ Abdomen - soft, nontender, nondistended, right lower quadrant ostomy bag  with  brown contents  neurological - screening mental status exam normal, neck supple without rigidity, cranial nerves II through XII intact, DTR's normal and symmetric Extremities - no pedal edema noted, intact peripheral pulses  Skin - warm, dry   Data Review   CBC w Diff:  Lab Results  Component Value Date   WBC 5.6 10/04/2021   HGB 13.6 10/04/2021   HGB 11.8 (L) 09/14/2018   HCT 41.3 10/04/2021   HCT 38.4 09/14/2018   PLT 81 (L) 10/04/2021   PLT 468 (H) 09/14/2018   LYMPHOPCT 20 10/03/2021   MONOPCT 10 10/03/2021   EOSPCT 2 10/03/2021   BASOPCT 1 10/03/2021    CMP:  Lab Results  Component Value Date   NA 137 10/04/2021   NA 135 09/14/2018   K 3.7 10/04/2021   CL 104 10/04/2021   CO2 26 10/04/2021   BUN 13 10/04/2021   BUN 7 (L) 09/14/2018   CREATININE 1.08 10/04/2021   PROT 6.8 10/03/2021   ALBUMIN 3.1 (L) 10/03/2021   BILITOT 0.8 10/03/2021   ALKPHOS 138 (H) 10/03/2021   AST 48 (H) 10/03/2021   ALT 34 10/03/2021  .  Total Discharge time is about 33 minutes  Roxan Hockey M.D on 10/04/2021 at 2:57 PM  Go to [email or web address] -  for contact info  [facility] - Office  [phone]

## 2021-10-04 NOTE — Progress Notes (Signed)
Brief ileoscopy note.  Edema and erythema noted to ileal mucosa at stoma with speck of blood. Digital exam normal. Focal erythema and edema involving distal small bowel.  Biopsies taken. Patient tolerated the procedure well.

## 2021-10-04 NOTE — Op Note (Signed)
Small Bowel Givens Capsule Study Procedure date: October 04, 2021  Referring Provider: Roxan Hockey, MD PCP:  Dr. Glenda Chroman, MD  Indication for procedure:   Patient is 81 year old Caucasian male who presents with ileostomy bleed.  Distal small bowel was examined while stoma revealing edema erythema and speck of blood and focal erythema and edema to distal small bowel without bleeding lesion.  Therefore small bowel given capsule study was recommended to complete the GI work-up. Please note patient has history of colon carcinoma leading to loss of colon and permanent ileostomy.  He remains in remission.   Findings:    Patient was able to swallow given capsule without any difficulty. Multiple scattered petechiae noted starting with image at [id number]. Small amount of blood seen on image at [phone] and 02430 felt to be due to small bowel biopsy. Small diverticulum best seen on image at [phone]. Marked edema erythema and mucosal friability noted at stoma seen on images [id number] and [id number].   First Gastric image: 1 minute and 53 seconds First Duodenal image: 18 minutes and 57 seconds First Ileo-Cecal Valve image: Not applicable First Cecal image: Not applicable Gastric Passage time: 17 minutes and 4 seconds Small Bowel Passage time from duodenum to ileostomy stoma: 2 hours and 56 minutes  Summary & Recommendations:  Scattered petechiae through most of the small bowel. This is a nonspecific finding.  If he has future episodes of bleeding would consider work-up for vasculitis. 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 Pronounced edema erythema and mucosal friability noted to mucosa at stoma.  Some of the images can be reviewed under clinical media. Results reviewed with patient over the phone.

## 2021-10-04 NOTE — Discharge Instructions (Signed)
1)Avoid Red or Pink colored food items or drinks for the next week or so 2)Hold Metoprolol 12.5 mg  if Heart Rate is less than 70 bpm 3)Use Anusol HC--apply hydrocortisone cream sparingly to ostomy area twice daily 4)Avoid ibuprofen/Advil/Aleve/Motrin/Goody Powders/Naproxen/BC powders/Meloxicam/Diclofenac/Indomethacin and other Nonsteroidal anti-inflammatory medications as these will make you more likely to bleed and can cause stomach ulcers, can also cause Kidney problems.  5)Follow up with Dr. Laural Golden -- address [address], [place] [address],,OVANV Number [phone] as advised for recheck  6)Repeat CBC and BMP blood test in about a week with your primary care physician 7)Consider referral to cardiologist Dr. Burt Knack to discuss possible Watchman device due to atrial fibrillation and your inability to take blood thinners

## 2021-10-04 NOTE — Op Note (Signed)
[facility] Patient Name: Jose Lawrence Procedure Date: 10/04/2021 12:22 PM MRN: [id number] Date of Birth: March 01, 1941 Attending MD: Hildred Laser , MD CSN: [id number] Age: 81 Admit Type: Inpatient Procedure:                Colonoscopy Indications:              Ileostomy bleed Providers:                Hildred Laser, MD, Lurline Del, RN Referring MD:             Roxan Hockey, MD Medicines:                Propofol per Anesthesia Complications:            No immediate complications. Estimated Blood Loss:     Estimated blood loss was minimal. Procedure:                Pre-Anesthesia Assessment:                           - Prior to the procedure, a History and Physical                            was performed, and patient medications and                            allergies were reviewed. The patient's tolerance of                            previous anesthesia was also reviewed. The risks                            and benefits of the procedure and the sedation                            options and risks were discussed with the patient.                            All questions were answered, and informed consent                            was obtained. Prior Anticoagulants: The patient has                            taken no previous anticoagulant or antiplatelet                            agents. ASA Grade Assessment: II - A patient with                            mild systemic disease. After reviewing the risks                            and benefits, the patient was deemed in  satisfactory condition to undergo the procedure.                           After obtaining informed consent, the colonoscope                            was passed under direct vision. Throughout the                            procedure, the patient's blood pressure, pulse, and                            oxygen saturations were monitored continuously. The                             [id number]) scope was introduced through                            the ileostomy and advanced to the 20 cm into the                            ileum. The colonoscopy was performed without                            difficulty. The patient tolerated the procedure                            well. The quality of the bowel preparation was                            excellent. Scope In: 12:35:35 PM Scope Out: 12:53:32 PM Total Procedure Duration: 0 hours 17 minutes 57 seconds  Findings:      There was evidence of ileostomy in right lower quadrant of abdomen.       Mucosa and ostomy was erythematous edematous and friable. Digital       examination was normal.      A patchy area of the distal ileum was congested. This was biopsied with       a cold forceps for histology. Impression:               - Congested erythematous mucosa at ileostomy stoma.                           - Congested mucosa in the distal ileum. Biopsied. Moderate Sedation:      Per Anesthesia Care Recommendation:           - To visualize the small bowel, perform video                            capsule endoscopy today.                           - NPO for 2 hours.                           - Continue  present medications.                           - Await pathology results. Procedure Code(s):        --- Professional ---                           [phone], Ileoscopy, through stoma; with biopsy,                            single or multiple Diagnosis Code(s):        --- Professional ---                           K63.89, Other specified diseases of intestine                           K94.11, Enterostomy hemorrhage CPT copyright 2019 American Medical Association. All rights reserved. The codes documented in this report are preliminary and upon coder review may  be revised to meet current compliance requirements. Hildred Laser, MD Hildred Laser, MD 10/04/2021 1:14:57 PM This report has been signed  electronically. Number of Addenda: 0

## 2021-10-04 NOTE — Progress Notes (Signed)
Pt informed plan for ileoscopy today. Advised to drink as much of prep as possible per Rehmans orders. Still no blood seen in colostomy bag. Will continue to monitor patient. Call light within reach.

## 2021-10-04 NOTE — Transfer of Care (Signed)
Immediate Anesthesia Transfer of Care Note  Patient: Jose Lawrence  Procedure(s) Performed: ILEOSCOPY THROUGH STOMA BIOPSY  Patient Location: PACU  Anesthesia Type:General  Level of Consciousness: awake, alert , oriented and sedated  Airway & Oxygen Therapy: Patient Spontanous Breathing and Patient connected to nasal cannula oxygen  Post-op Assessment: Report given to RN and Post -op Vital signs reviewed and stable  Post vital signs: Reviewed and stable  Last Vitals:  Vitals Value Taken Time  BP 128/66 10/04/21 1300  Temp 97.6   Pulse 59 10/04/21 1300  Resp 16 10/04/21 1300  SpO2 100 % 10/04/21 1300    Last Pain:  Vitals:   10/04/21 1300  TempSrc:   PainSc: 0-No pain      Patients Stated Pain Goal: 7 (62/19/47 1252)  Complications: No notable events documented.

## 2021-10-04 NOTE — Progress Notes (Signed)
Discharge instructions given pt verbalized understanding. Discharged patient via wheelchair to private vehicle.

## 2021-10-04 NOTE — Progress Notes (Signed)
Subjective:  Patient has no complaints.  He only drank 4 to 5 cups during the night.  He says he was told to drink 1 cup every hour.  He already has had 4 cups this morning.  His wife states out from ileostomy was clear this morning.  Current Medications:  Current Facility-Administered Medications:    0.9 %  sodium chloride infusion, , Intravenous, Continuous, Kathrin Folden U, MD, Last Rate: 20 mL/hr at 10/03/21 1750, New Bag at 10/03/21 1750   0.9 %  sodium chloride infusion, 250 mL, Intravenous, PRN, Denton Brick, Courage, MD   acetaminophen (TYLENOL) tablet 650 mg, 650 mg, Oral, Q6H PRN **OR** acetaminophen (TYLENOL) suppository 650 mg, 650 mg, Rectal, Q6H PRN, Emokpae, Courage, MD   bisacodyl (DULCOLAX) suppository 10 mg, 10 mg, Rectal, Daily PRN, Emokpae, Courage, MD   ergocalciferol (VITAMIN D2) capsule 50,000 Units, 50,000 Units, Oral, Weekly, Emokpae, Courage, MD   insulin aspart (novoLOG) injection 0-5 Units, 0-5 Units, Subcutaneous, QHS, Emokpae, Courage, MD   insulin aspart (novoLOG) injection 0-6 Units, 0-6 Units, Subcutaneous, TID WC, Emokpae, Courage, MD   levothyroxine (SYNTHROID) tablet 50 mcg, 50 mcg, Oral, QAC breakfast, Emokpae, Courage, MD   loperamide (IMODIUM) capsule 4 mg, 4 mg, Oral, TID AC & HS, Emokpae, Courage, MD   metoprolol tartrate (LOPRESSOR) tablet 12.5 mg, 12.5 mg, Oral, BID, Emokpae, Courage, MD   ondansetron (ZOFRAN) tablet 4 mg, 4 mg, Oral, Q6H PRN **OR** ondansetron (ZOFRAN) injection 4 mg, 4 mg, Intravenous, Q6H PRN, Emokpae, Courage, MD   pantoprazole (PROTONIX) injection 40 mg, 40 mg, Intravenous, Q12H, Emokpae, Courage, MD, 40 mg at 10/03/21 2124   polyethylene glycol (MIRALAX / GLYCOLAX) packet 17 g, 17 g, Oral, Daily PRN, Emokpae, Courage, MD   sodium chloride flush (NS) 0.9 % injection 3 mL, 3 mL, Intravenous, Q12H, Emokpae, Courage, MD, 3 mL at 10/04/21 0953   sodium chloride flush (NS) 0.9 % injection 3 mL, 3 mL, Intravenous, Q12H, Emokpae, Courage,  MD, 3 mL at 10/04/21 0953   sodium chloride flush (NS) 0.9 % injection 3 mL, 3 mL, Intravenous, PRN, Emokpae, Courage, MD   tamsulosin (FLOMAX) capsule 0.4 mg, 0.4 mg, Oral, QHS, Emokpae, Courage, MD, 0.4 mg at 10/03/21 2117   traZODone (DESYREL) tablet 50 mg, 50 mg, Oral, QHS PRN, Emokpae, Courage, MD   Objective: Blood pressure (!) 107/58, pulse (!) 56, temperature 98.1 F (36.7 C), resp. rate 18, height 5' 11.5" (1.816 m), weight 86.2 kg, SpO2 97 %. Patient is alert and in no acute distress. He has coffee-ground material in ileostomy bag.  There is no food debris or clots.  Labs/studies Results:   CBC Latest Ref Rng & Units 10/04/2021 10/03/2021 09/14/2021  WBC 4.0 - 10.5 K/uL 5.6 4.0 7.6  Hemoglobin 13.0 - 17.0 g/dL 13.6 13.8 17.3(H)  Hematocrit 39.0 - 52.0 % 41.3 40.4 49.7  Platelets 150 - 400 K/uL 81(L) 69(L) 101(L)    CMP Latest Ref Rng & Units 10/04/2021 10/03/2021 09/14/2021  Glucose 70 - 99 mg/dL 85 236(H) 162(H)  BUN 8 - 23 mg/dL _0 Creatinine 0.61 - 1.24 mg/dL 1.08 1.24 1.32(H)  Sodium 135 - 145 mmol/L 137 133(L) 133(L)  Potassium 3.5 - 5.1 mmol/L 3.7 4.8 4.8  Chloride 98 - 111 mmol/L 104 105 100  CO2 22 - 32 mmol/L _1 Calcium 8.9 - 10.3 mg/dL 9.1 8.8(L) 9.4  Total Protein 6.5 - 8.1 g/dL - 6.8 7.8  Total Bilirubin 0.3 - 1.2 mg/dL -  2.3(H)  °Alkaline Phos 38 - 126 U/L - 138(H) 163(H)  °AST 15 - 41 U/L - 48(H) 42(H)  °ALT 0 - 44 U/L - 34 30  °  °Hepatic Function Latest Ref Rng & Units 10/03/2021 09/14/2021 02/20/2021  °Total Protein 6.5 - 8.1 g/dL 6.8 7.8 6.8  °Albumin 3.5 - 5.0 g/dL 3.1(L) 3.8 3.5  °AST 15 - 41 U/L 48(H) 42(H) 30  °ALT 0 - 44 U/L 34 30 23  °Alk Phosphatase 38 - 126 U/L 138(H) 163(H) 127(H)  °Total Bilirubin 0.3 - 1.2 mg/dL 0.8 2.3(H) 0.9  °  ° ° °Assessment: ° °#1.  Ileostomy bleed.  H&H remained stable.  Patient is hemodynamically stable as well.  He will undergo ileoscopy this morning followed by given capsule if ileoscopy is negative. ° °#2.   History of recurrent colon cancer.  He had remaining colon removed in January 2020.  No evidence of metastatic disease. ° °#3.  Thrombocytopenia.  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  No evidence of portal hypertension or splenomegaly on imaging studies. ° °Plan: ° °Proceed with ileoscopy under monitored anesthesia care later today. °Small bowel given capsule study if ileoscopy is nonrevealing. ° ° ° ° ° °

## 2021-10-06 LAB — SURGICAL PATHOLOGY

## 2021-10-08 ENCOUNTER — Encounter (HOSPITAL_COMMUNITY): Payer: Self-pay | Admitting: Internal Medicine

## 2021-10-09 DIAGNOSIS — K9400 Colostomy complication, unspecified: Secondary | ICD-10-CM | POA: Diagnosis not present

## 2021-10-09 DIAGNOSIS — R001 Bradycardia, unspecified: Secondary | ICD-10-CM | POA: Diagnosis not present

## 2021-10-09 DIAGNOSIS — Z09 Encounter for follow-up examination after completed treatment for conditions other than malignant neoplasm: Secondary | ICD-10-CM | POA: Diagnosis not present

## 2021-10-09 DIAGNOSIS — I10 Essential (primary) hypertension: Secondary | ICD-10-CM | POA: Diagnosis not present

## 2021-10-09 DIAGNOSIS — Z932 Ileostomy status: Secondary | ICD-10-CM | POA: Diagnosis not present

## 2021-10-26 ENCOUNTER — Encounter (HOSPITAL_COMMUNITY): Payer: Self-pay | Admitting: *Deleted

## 2021-10-26 ENCOUNTER — Emergency Department (HOSPITAL_COMMUNITY)
Admission: EM | Admit: 2021-10-26 | Discharge: 2021-10-26 | Disposition: A | Discharge status: Home / Self Care | Payer: Medicare Other | Attending: Emergency Medicine | Admitting: Emergency Medicine

## 2021-10-26 ENCOUNTER — Encounter (HOSPITAL_COMMUNITY): Payer: Self-pay | Admitting: Hematology

## 2021-10-26 ENCOUNTER — Emergency Department (HOSPITAL_COMMUNITY): Payer: Medicare Other

## 2021-10-26 DIAGNOSIS — N390 Urinary tract infection, site not specified: Secondary | ICD-10-CM | POA: Diagnosis not present

## 2021-10-26 DIAGNOSIS — M2578 Osteophyte, vertebrae: Secondary | ICD-10-CM | POA: Diagnosis not present

## 2021-10-26 DIAGNOSIS — M545 Low back pain, unspecified: Secondary | ICD-10-CM

## 2021-10-26 DIAGNOSIS — M48061 Spinal stenosis, lumbar region without neurogenic claudication: Secondary | ICD-10-CM | POA: Diagnosis not present

## 2021-10-26 LAB — URINALYSIS, ROUTINE W REFLEX MICROSCOPIC
Bilirubin Urine: NEGATIVE
Glucose, UA: >=500 mg/dL — AB
Ketones, ur: NEGATIVE mg/dL
Nitrite: NEGATIVE
Protein, ur: 30 mg/dL — AB
Specific Gravity, Urine: 1.024 (ref 1.005–1.030)
pH: 5 (ref 5.0–8.0)

## 2021-10-26 MED ORDER — CEPHALEXIN 500 MG PO CAPS
500.0000 mg | ORAL_CAPSULE | Freq: Three times a day (TID) | ORAL | 0 refills | Status: AC
Start: 2021-10-26 — End: 2021-11-02

## 2021-10-26 MED ORDER — HYDROCODONE-ACETAMINOPHEN 5-325 MG PO TABS: 2.0000 | ORAL_TABLET | Freq: Four times a day (QID) | ORAL | 0 refills | Status: DC | PRN

## 2021-10-26 MED ORDER — HYDROMORPHONE HCL 1 MG/ML IJ SOLN
1.0000 mg | Freq: Once | INTRAMUSCULAR | Status: AC
  Administered 2021-10-26: 1 mg via INTRAMUSCULAR
  Filled 2021-10-26: qty 1

## 2021-10-26 MED ORDER — KETOROLAC TROMETHAMINE 30 MG/ML IJ SOLN
30.0000 mg | Freq: Once | INTRAMUSCULAR | Status: AC
  Administered 2021-10-26: 30 mg via INTRAMUSCULAR
  Filled 2021-10-26: qty 1

## 2021-10-26 NOTE — Discharge Instructions (Addendum)
Return if any problems.  See your Physician for recheck in 2-3 days °

## 2021-10-26 NOTE — ED Triage Notes (Signed)
Recurrent back pain °

## 2021-10-26 NOTE — ED Provider Notes (Signed)
?[facility] ?Provider Note ? ? ?CSN: [id number] ?Arrival date & time: 10/26/21  1302 ? ?  ? ?History ? ?Chief Complaint  ?Patient presents with  ? Back Pain  ? ? ?Jose Lawrence is a 81 y.o. male. ? ?The history is provided by the patient. No language interpreter was used.  ?Back Pain ?Location:  Lumbar spine ?Quality:  Aching ?Radiates to:  Does not radiate ?Pain severity:  Moderate ?Pain is:  Worse during the night ?Duration:  3 days ?Timing:  Constant ?Progression:  Worsening ?Chronicity:  New ?Relieved by:  Nothing ?Worsened by:  Nothing ?Ineffective treatments:  None tried ?Associated symptoms: abdominal pain and fever   ? ?  ? ?Home Medications ?Prior to Admission medications   ?Medication Sig Start Date End Date Taking? Authorizing Provider  ?ergocalciferol (VITAMIN D2) 1.25 MG (50000 UT) capsule Take 1 capsule (50,000 Units total) by mouth once a week. 09/19/20   Derek Jack, MD  ?ferrous sulfate (FERROUSUL) 325 (65 FE) MG tablet Take 1 tablet (325 mg total) by mouth 2 (two) times daily with a meal. 10/07/21   Emokpae, Courage, MD  ?glipiZIDE (GLUCOTROL) 10 MG tablet Take 10 mg by mouth 2 (two) times daily. 07/05/18   [provider]  ?hydrocortisone (ANUSOL-HC) 2.5 % rectal cream Apply 1 application topically 2 (two) times daily. Apply Sparingly Twice Daily to Ostomy area 10/04/21   Roxan Hockey, MD  ?lactose free nutrition (BOOST) LIQD Take 237 mLs by mouth 3 (three) times daily between meals.    [provider]  ?levothyroxine (SYNTHROID, LEVOTHROID) 50 MCG tablet Take 50 mcg by mouth daily before breakfast.    [provider]  ?loperamide (IMODIUM) 2 MG capsule Take 2 capsules (4 mg total) by mouth 4 (four) times daily -  before meals and at bedtime. 11/11/18   [name], MD  ?metoprolol tartrate (LOPRESSOR) 25 MG tablet Take 0.5 tablets (12.5 mg total) by mouth 2 (two) times daily. Hold if Heart Rate < 70 bpm 10/04/21   Roxan Hockey,  MD  ?pantoprazole (PROTONIX) 40 MG tablet Take 1 tablet (40 mg total) by mouth daily. 10/04/21 10/04/22  Roxan Hockey, MD  ?tamsulosin (FLOMAX) 0.4 MG CAPS capsule Take 1 capsule (0.4 mg total) by mouth daily after supper. 10/04/21   Roxan Hockey, MD  ?   ? ?Allergies    ?Patient has no known allergies.   ? ?Review of Systems   ?Review of Systems  ?Constitutional:  Positive for fever.  ?Gastrointestinal:  Positive for abdominal pain.  ?Musculoskeletal:  Positive for back pain.  ?All other systems reviewed and are negative. ? ?Physical Exam ?Updated Vital Signs ?BP 138/72 (BP Location: Left Arm)   Pulse 72   Temp (!) 97.5 ?F (36.4 ?C) (Oral)   Resp 16   Ht 6' (1.829 m)   Wt 86.2 kg   SpO2 97%   BMI 25.77 kg/m?  ?Physical Exam ?Vitals and nursing note reviewed.  ?Constitutional:   ?   Appearance: He is well-developed.  ?HENT:  ?   Head: Normocephalic.  ?   Mouth/Throat:  ?   Mouth: Mucous membranes are moist.  ?Cardiovascular:  ?   Rate and Rhythm: Normal rate.  ?Pulmonary:  ?   Effort: Pulmonary effort is normal.  ?Abdominal:  ?   General: There is no distension.  ?Musculoskeletal:     ?   General: Normal range of motion.  ?   Cervical back: Normal range of motion.  ?  Comments: Tender right flank.    ?Skin: ?   General: Skin is warm.  ?   Findings: No rash.  ?Neurological:  ?   General: No focal deficit present.  ?   Mental Status: He is alert and oriented to person, place, and time.  ?Psychiatric:     ?   Mood and Affect: Mood normal.  ? ? ?ED Results / Procedures / Treatments   ?Labs ?(all labs ordered are listed, but only abnormal results are displayed) ?Labs Reviewed  ?URINALYSIS, ROUTINE W REFLEX MICROSCOPIC - Abnormal; Notable for the following components:  ?    Result Value  ? Color, Urine AMBER (*)   ? APPearance CLOUDY (*)   ? Glucose, UA >=500 (*)   ? Hgb urine dipstick SMALL (*)   ? Protein, ur 30 (*)   ? Leukocytes,Ua LARGE (*)   ? Bacteria, UA MANY (*)   ? All other components within normal  limits  ? ? ?EKG ?None ? ?Radiology ?DG Lumbar Spine Complete ? ?Result Date: 10/26/2021 ?CLINICAL DATA:  Chronic pain. EXAM: LUMBAR SPINE - COMPLETE 4+ VIEW COMPARISON:  Lumbar spine x-ray 06/23/2020. FINDINGS: There is mild dextroconvex curvature at L4-L5 which is similar to the prior study. The bones are osteopenic. There is no acute fracture or dislocation identified. Spinal alignment is normal. There is no acute fracture identified. There is moderate severe disc space narrowing at L2-L3, L4-L5 and L5-S1. Degenerative endplate osteophytes are seen throughout the lumbar spine. There are surgical clips in the pelvis and abdomen. IMPRESSION: 1. No acute bony abnormality. 2. Moderate severe multilevel degenerative changes. Electronically Signed   By: Ronney Asters M.D.   On: 10/26/2021 15:41   ? ?Procedures ?Procedures  ? ? ?Medications Ordered in ED ?Medications  ?HYDROmorphone (DILAUDID) injection 1 mg (1 mg Intramuscular Given 10/26/21 1418)  ?ketorolac (TORADOL) 30 MG/ML injection 30 mg (30 mg Intramuscular Given 10/26/21 1418)  ? ? ?ED Course/ Medical Decision Making/ A&P ?  ?                        ?Medical Decision Making ?Pt reports increasing pain for the past 3 days.  Pt has had a uti about 3 weeks ago.  Pt has a history of colon cancer  ? ?Problems Addressed: ?Acute low back pain without sciatica, unspecified back pain laterality: acute illness or injury ? ?Amount and/or Complexity of Data Reviewed ?Independent Historian: spouse ?   Details: Pt's wife reports pt took a pain pill from old rx yesterday.  Pt reports pain is worse today ?External Data Reviewed: notes. ?   Details: Notes from Dr. Lamonte Richer Oncology reviewed  and Dr. Laural Golden  reviewed ?Labs: ordered. Decision-making details documented in ED Course. ?   Details: Ua ordered, reviewed adn interpreted.  Ua shows bacteria and wbc's. ?Radiology: ordered and independent interpretation performed. Decision-making details documented in ED Course. ?    Details: LS spine xray  shows degenerative changes.  no metastatic disease.  I considered CT scan to evaluate for stone.  He had a CT scan done on 2/25 which showed no indication of renal stones no indication of metastatic disease I do not feel patient needs CT scan today as he is not having any abdominal pain ? ?Risk ?Prescription drug management. ?Parenteral controlled substances. ?Risk Details: Patient is given injection of Toradol and Dilaudid IM patient reports pain is much improved.  Patient's vital signs have been stable throughout his stay pain  seems most likely secondary to UTI.  I discussed symptoms with patient he is advised he needs to recheck with his primary care doctor in 2 to 3 days he is to return to the emergency department if fever or increasing pain patient is given a prescription for Keflex and hydrocodone for pain. ? ? ?Pt given injection of toradol and dilaudid IM.  Pt reports feeling better.  Ua obtained and shows 11-20 wbcs and many bacteria.  Pt reports he was treated about 3 weeks ago for a uti.   ? ? ? ? ? ? ? ?Final Clinical Impression(s) / ED Diagnoses ?Final diagnoses:  ?Urinary tract infection without hematuria, site unspecified  ?Acute low back pain without sciatica, unspecified back pain laterality  ? ? ?Rx / DC Orders ?ED Discharge Orders   ? ? None  ? ?  ? ?An After Visit Summary was printed and given to the patient.  ?  ?Fransico Meadow, PA-C ?10/26/21 1804 ? ?  ?Luna Fuse, MD ?11/06/21 1644 ? ?

## 2021-10-28 DIAGNOSIS — N390 Urinary tract infection, site not specified: Secondary | ICD-10-CM | POA: Diagnosis not present

## 2021-10-28 DIAGNOSIS — Z299 Encounter for prophylactic measures, unspecified: Secondary | ICD-10-CM | POA: Diagnosis not present

## 2021-10-28 DIAGNOSIS — E1165 Type 2 diabetes mellitus with hyperglycemia: Secondary | ICD-10-CM | POA: Diagnosis not present

## 2021-10-28 DIAGNOSIS — I10 Essential (primary) hypertension: Secondary | ICD-10-CM | POA: Diagnosis not present

## 2021-10-28 DIAGNOSIS — I4891 Unspecified atrial fibrillation: Secondary | ICD-10-CM | POA: Diagnosis not present

## 2021-10-28 LAB — URINE CULTURE: Culture: >=100000 — AB

## 2021-10-29 ENCOUNTER — Telehealth: Payer: Self-pay

## 2021-10-29 NOTE — Telephone Encounter (Signed)
Post ED Visit - Positive Culture Follow-up ? ?Culture report reviewed by antimicrobial stewardship pharmacist: ?[facility] Team ?'[]'$  Elenor Quinones, Pharm.D. ?'[]'$  Heide Guile, Pharm.D., BCPS AQ-ID ?'[]'$  Parks Neptune, Pharm.D., BCPS ?'[]'$  Alycia Rossetti, Pharm.D., BCPS ?'[]'$  [place], Pharm.D., BCPS, AAHIVP ?'[]'$  Legrand Como, Pharm.D., BCPS, AAHIVP ?'[]'$  Salome Arnt, PharmD, BCPS ?'[]'$  Johnnette Gourd, PharmD, BCPS ?'[]'$  [name], PharmD, BCPS ?'[x]'$  [name], PharmD ?'[]'$  Laqueta Linden, PharmD, BCPS ?'[]'$  Albertina Parr, PharmD ? ?[facility] Team ?'[]'$  Leodis Sias, PharmD ?'[]'$  Lindell Spar, PharmD ?'[]'$  Royetta Asal, PharmD ?'[]'$  Graylin Shiver, Rph ?'[]'$  Rema Fendt) Jose Mac, PharmD ?'[]'$  Arlyn Dunning, PharmD ?'[]'$  Netta Cedars, PharmD ?'[]'$  Dia Sitter, PharmD ?'[]'$  Leone Haven, PharmD ?'[]'$  Gretta Arab, PharmD ?'[]'$  Theodis Shove, PharmD ?'[]'$  Peggyann Juba, PharmD ?'[]'$  Reuel Boom, PharmD ? ? ?Positive urine culture ?Treated with Cephalexin, organism sensitive to the same and no further patient follow-up is required at this time. ? ?Jose Lawrence ?10/29/2021, 10:58 AM ?  ?

## 2021-12-09 ENCOUNTER — Other Ambulatory Visit (HOSPITAL_COMMUNITY): Payer: Self-pay | Admitting: Hematology

## 2021-12-09 DIAGNOSIS — E559 Vitamin D deficiency, unspecified: Secondary | ICD-10-CM

## 2021-12-24 DIAGNOSIS — E1165 Type 2 diabetes mellitus with hyperglycemia: Secondary | ICD-10-CM | POA: Diagnosis not present

## 2021-12-24 DIAGNOSIS — Z299 Encounter for prophylactic measures, unspecified: Secondary | ICD-10-CM | POA: Diagnosis not present

## 2021-12-24 DIAGNOSIS — I4891 Unspecified atrial fibrillation: Secondary | ICD-10-CM | POA: Diagnosis not present

## 2021-12-24 DIAGNOSIS — D485 Neoplasm of uncertain behavior of skin: Secondary | ICD-10-CM | POA: Diagnosis not present

## 2022-01-06 DIAGNOSIS — Z6826 Body mass index (BMI) 26.0-26.9, adult: Secondary | ICD-10-CM | POA: Diagnosis not present

## 2022-01-06 DIAGNOSIS — D492 Neoplasm of unspecified behavior of bone, soft tissue, and skin: Secondary | ICD-10-CM | POA: Diagnosis not present

## 2022-01-06 DIAGNOSIS — I10 Essential (primary) hypertension: Secondary | ICD-10-CM | POA: Diagnosis not present

## 2022-01-06 DIAGNOSIS — E1165 Type 2 diabetes mellitus with hyperglycemia: Secondary | ICD-10-CM | POA: Diagnosis not present

## 2022-01-06 DIAGNOSIS — Z299 Encounter for prophylactic measures, unspecified: Secondary | ICD-10-CM | POA: Diagnosis not present

## 2022-02-01 DIAGNOSIS — C44319 Basal cell carcinoma of skin of other parts of face: Secondary | ICD-10-CM | POA: Diagnosis not present

## 2022-02-08 DIAGNOSIS — K9400 Colostomy complication, unspecified: Secondary | ICD-10-CM | POA: Diagnosis not present

## 2022-02-08 DIAGNOSIS — I4891 Unspecified atrial fibrillation: Secondary | ICD-10-CM | POA: Diagnosis not present

## 2022-02-08 DIAGNOSIS — L989 Disorder of the skin and subcutaneous tissue, unspecified: Secondary | ICD-10-CM | POA: Diagnosis not present

## 2022-02-08 DIAGNOSIS — E1165 Type 2 diabetes mellitus with hyperglycemia: Secondary | ICD-10-CM | POA: Diagnosis not present

## 2022-02-08 DIAGNOSIS — Z299 Encounter for prophylactic measures, unspecified: Secondary | ICD-10-CM | POA: Diagnosis not present

## 2022-03-23 ENCOUNTER — Inpatient Hospital Stay: Payer: Medicare Other | Attending: Hematology

## 2022-03-23 ENCOUNTER — Ambulatory Visit (HOSPITAL_COMMUNITY)
Admission: RE | Admit: 2022-03-23 | Discharge: 2022-03-23 | Disposition: A | Discharge status: Home / Self Care | Payer: Medicare Other | Source: Ambulatory Visit | Attending: Hematology | Admitting: Hematology

## 2022-03-23 DIAGNOSIS — G8929 Other chronic pain: Secondary | ICD-10-CM | POA: Diagnosis not present

## 2022-03-23 DIAGNOSIS — Z85828 Personal history of other malignant neoplasm of skin: Secondary | ICD-10-CM | POA: Diagnosis not present

## 2022-03-23 DIAGNOSIS — R42 Dizziness and giddiness: Secondary | ICD-10-CM | POA: Insufficient documentation

## 2022-03-23 DIAGNOSIS — Z8673 Personal history of transient ischemic attack (TIA), and cerebral infarction without residual deficits: Secondary | ICD-10-CM | POA: Insufficient documentation

## 2022-03-23 DIAGNOSIS — R2689 Other abnormalities of gait and mobility: Secondary | ICD-10-CM | POA: Diagnosis not present

## 2022-03-23 DIAGNOSIS — C184 Malignant neoplasm of transverse colon: Secondary | ICD-10-CM | POA: Insufficient documentation

## 2022-03-23 DIAGNOSIS — Z87891 Personal history of nicotine dependence: Secondary | ICD-10-CM | POA: Diagnosis not present

## 2022-03-23 DIAGNOSIS — R200 Anesthesia of skin: Secondary | ICD-10-CM | POA: Diagnosis not present

## 2022-03-23 DIAGNOSIS — Z9049 Acquired absence of other specified parts of digestive tract: Secondary | ICD-10-CM | POA: Diagnosis not present

## 2022-03-23 DIAGNOSIS — M549 Dorsalgia, unspecified: Secondary | ICD-10-CM | POA: Insufficient documentation

## 2022-03-23 DIAGNOSIS — Z79899 Other long term (current) drug therapy: Secondary | ICD-10-CM | POA: Diagnosis not present

## 2022-03-23 DIAGNOSIS — D696 Thrombocytopenia, unspecified: Secondary | ICD-10-CM | POA: Insufficient documentation

## 2022-03-23 DIAGNOSIS — E538 Deficiency of other specified B group vitamins: Secondary | ICD-10-CM | POA: Insufficient documentation

## 2022-03-23 DIAGNOSIS — Z79890 Hormone replacement therapy (postmenopausal): Secondary | ICD-10-CM | POA: Insufficient documentation

## 2022-03-23 DIAGNOSIS — E559 Vitamin D deficiency, unspecified: Secondary | ICD-10-CM | POA: Insufficient documentation

## 2022-03-23 DIAGNOSIS — Z86711 Personal history of pulmonary embolism: Secondary | ICD-10-CM | POA: Diagnosis not present

## 2022-03-23 DIAGNOSIS — I700 Atherosclerosis of aorta: Secondary | ICD-10-CM | POA: Insufficient documentation

## 2022-03-23 LAB — COMPREHENSIVE METABOLIC PANEL
ALT: 45 U/L — ABNORMAL HIGH (ref 0–44)
AST: 56 U/L — ABNORMAL HIGH (ref 15–41)
Albumin: 3.7 g/dL (ref 3.5–5.0)
Alkaline Phosphatase: 189 U/L — ABNORMAL HIGH (ref 38–126)
Anion gap: 5 (ref 5–15)
BUN: 20 mg/dL (ref 8–23)
CO2: 25 mmol/L (ref 22–32)
Calcium: 9.5 mg/dL (ref 8.9–10.3)
Chloride: 107 mmol/L (ref 98–111)
Creatinine, Ser: 1.31 mg/dL — ABNORMAL HIGH (ref 0.61–1.24)
GFR, Estimated: 55 mL/min — ABNORMAL LOW (ref >60)
Glucose, Bld: 108 mg/dL — ABNORMAL HIGH (ref 70–99)
Potassium: 4.3 mmol/L (ref 3.5–5.1)
Sodium: 137 mmol/L (ref 135–145)
Total Bilirubin: 1.8 mg/dL — ABNORMAL HIGH (ref 0.3–1.2)
Total Protein: 7.9 g/dL (ref 6.5–8.1)

## 2022-03-23 LAB — CBC WITH DIFFERENTIAL/PLATELET
Abs Immature Granulocytes: 0.01 10*3/uL (ref 0.00–0.07)
Basophils Absolute: 0.1 10*3/uL (ref 0.0–0.1)
Basophils Relative: 1 %
Eosinophils Absolute: 0.2 10*3/uL (ref 0.0–0.5)
Eosinophils Relative: 4 %
HCT: 44.5 % (ref 39.0–52.0)
Hemoglobin: 15.1 g/dL (ref 13.0–17.0)
Immature Granulocytes: 0 %
Lymphocytes Relative: 31 %
Lymphs Abs: 1.8 10*3/uL (ref 0.7–4.0)
MCH: 34.5 pg — ABNORMAL HIGH (ref 26.0–34.0)
MCHC: 33.9 g/dL (ref 30.0–36.0)
MCV: 101.6 fL — ABNORMAL HIGH (ref 80.0–100.0)
Monocytes Absolute: 0.5 10*3/uL (ref 0.1–1.0)
Monocytes Relative: 8 %
Neutro Abs: 3.3 10*3/uL (ref 1.7–7.7)
Neutrophils Relative %: 56 %
Platelets: 90 10*3/uL — ABNORMAL LOW (ref 150–400)
RBC: 4.38 MIL/uL (ref 4.22–5.81)
RDW: 13.8 % (ref 11.5–15.5)
WBC: 5.9 10*3/uL (ref 4.0–10.5)
nRBC: 0 % (ref 0.0–0.2)

## 2022-03-23 LAB — VITAMIN B12: Vitamin B-12: 352 pg/mL (ref 180–914)

## 2022-03-23 LAB — VITAMIN D 25 HYDROXY (VIT D DEFICIENCY, FRACTURES): Vit D, 25-Hydroxy: 36.44 ng/mL (ref 30–100)

## 2022-03-23 MED ORDER — IOHEXOL 300 MG/ML  SOLN
100.0000 mL | Freq: Once | INTRAMUSCULAR | Status: AC | PRN
  Administered 2022-03-23: 100 mL via INTRAVENOUS

## 2022-03-24 LAB — CEA: CEA: 3.5 ng/mL (ref 0.0–4.7)

## 2022-03-29 ENCOUNTER — Other Ambulatory Visit (HOSPITAL_COMMUNITY): Payer: Self-pay | Admitting: Hematology

## 2022-03-29 DIAGNOSIS — E559 Vitamin D deficiency, unspecified: Secondary | ICD-10-CM

## 2022-03-30 ENCOUNTER — Inpatient Hospital Stay (HOSPITAL_BASED_OUTPATIENT_CLINIC_OR_DEPARTMENT_OTHER): Payer: Medicare Other | Admitting: Hematology

## 2022-03-30 ENCOUNTER — Other Ambulatory Visit: Payer: Self-pay | Admitting: *Deleted

## 2022-03-30 VITALS — BP 129/73 | HR 60 | Temp 97.7°F | Resp 18 | Ht 71.0 in | Wt 185.0 lb

## 2022-03-30 DIAGNOSIS — C184 Malignant neoplasm of transverse colon: Secondary | ICD-10-CM

## 2022-03-30 DIAGNOSIS — D696 Thrombocytopenia, unspecified: Secondary | ICD-10-CM | POA: Diagnosis not present

## 2022-03-30 DIAGNOSIS — G8929 Other chronic pain: Secondary | ICD-10-CM | POA: Diagnosis not present

## 2022-03-30 DIAGNOSIS — E538 Deficiency of other specified B group vitamins: Secondary | ICD-10-CM | POA: Diagnosis not present

## 2022-03-30 DIAGNOSIS — M549 Dorsalgia, unspecified: Secondary | ICD-10-CM | POA: Diagnosis not present

## 2022-03-30 DIAGNOSIS — E559 Vitamin D deficiency, unspecified: Secondary | ICD-10-CM

## 2022-03-30 DIAGNOSIS — R2689 Other abnormalities of gait and mobility: Secondary | ICD-10-CM | POA: Diagnosis not present

## 2022-03-30 NOTE — Progress Notes (Signed)
[place], [place] [address]   CLINIC:  Medical Oncology/Hematology  PCP:  Glenda Chroman, MD [address] / [place] [address] [phone]   REASON FOR VISIT:  Follow-up for transverse colon cancer  PRIOR THERAPY:  1. Colon resection with colostomy in 09/2007. 2. FOLFOX and radiation from 09/2007 through 02/2008 3. 12 cycles of infusional 5-FU and leucovorin from 10/09/2018 through 03/21/2019.  NGS Results: Foundation 1 MSI--stable  CURRENT THERAPY: Surveillance  BRIEF ONCOLOGIC HISTORY:  Oncology History  Cancer of transverse colon ([place])  09/23/2018 Initial Diagnosis   Cancer of transverse colon ([place])   09/29/2018 Cancer Staging   Staging form: Colon and Rectum, AJCC 8th Edition - Clinical stage from 09/29/2018: Stage IIB (cT4a, cN0, cM0) - Signed by Derek Jack, MD on 09/29/2018   10/09/2018 - 03/23/2019 Chemotherapy   The patient had ondansetron (ZOFRAN) 8 mg in sodium chloride 0.9 % 50 mL IVPB, 8 mg (100 % of original dose 8 mg), Intravenous,  Once, 12 of 12 cycles Dose modification: 8 mg (original dose 8 mg, Cycle 1) Administration: 8 mg (10/09/2018), 8 mg (10/23/2018), 8 mg (11/06/2018), 8 mg (11/20/2018), 8 mg (12/04/2018), 8 mg (12/18/2018), 8 mg (01/02/2019), 8 mg (01/15/2019), 8 mg (02/06/2019), 8 mg (02/21/2019), 8 mg (03/07/2019), 8 mg (03/21/2019) leucovorin 800 mg in dextrose 5 % 250 mL infusion, 820 mg, Intravenous,  Once, 12 of 12 cycles Administration: 800 mg (10/09/2018), 820 mg (10/23/2018), 820 mg (11/06/2018), 820 mg (11/20/2018), 820 mg (12/04/2018), 820 mg (12/18/2018), 820 mg (01/02/2019), 820 mg (01/15/2019), 820 mg (02/06/2019), 820 mg (02/21/2019), 820 mg (03/07/2019), 820 mg (03/21/2019) fluorouracil (ADRUCIL) chemo injection 800 mg, 400 mg/m2 = 800 mg, Intravenous,  Once, 12 of 12 cycles Administration: 800 mg (10/09/2018), 800 mg (10/23/2018), 800 mg (11/06/2018), 800 mg (11/20/2018), 800 mg (12/04/2018), 800 mg (12/18/2018), 800 mg  (01/02/2019), 800 mg (01/15/2019), 800 mg (02/06/2019), 800 mg (02/21/2019), 800 mg (03/07/2019), 800 mg (03/21/2019) fluorouracil (ADRUCIL) 4,900 mg in sodium chloride 0.9 % 52 mL chemo infusion, 2,400 mg/m2 = 4,900 mg, Intravenous, 1 Day/Dose, 12 of 12 cycles Administration: 4,900 mg (10/09/2018), 4,900 mg (10/23/2018), 5,000 mg (11/06/2018), 5,000 mg (11/20/2018), 5,000 mg (12/04/2018), 5,000 mg (12/18/2018), 5,000 mg (01/02/2019), 5,000 mg (01/15/2019), 5,000 mg (02/06/2019), 5,000 mg (02/21/2019), 5,000 mg (03/07/2019), 5,000 mg (03/21/2019)  for chemotherapy treatment.      CANCER STAGING:  Cancer Staging  Cancer of transverse colon [facility]) Staging form: Colon and Rectum, AJCC 8th Edition - Clinical stage from 09/29/2018: Stage IIB (cT4a, cN0, cM0) - Signed by Derek Jack, MD on 09/29/2018   INTERVAL HISTORY:  [name], a 81 y.o. male, seen for follow-up of colon cancer.  Denies any change in bowel habits.  Denies any bleeding per rectum or melena.  He has lost about 10 pounds in the last 6 months.  He reports decrease in appetite.  He reportedly fell about 6 weeks ago without any major injuries due to balance problem.  Tingling or numbness in the fingers and feet has been stable.  He is taking Tylenol twice daily for chronic back pain.  REVIEW OF SYSTEMS:  Review of Systems  Constitutional:  Positive for unexpected weight change. Negative for appetite change and fatigue.  Gastrointestinal:  Negative for constipation, diarrhea and nausea.  Neurological:  Positive for dizziness and numbness.  Psychiatric/Behavioral:  Negative for sleep disturbance.   All other systems reviewed and are negative.   PAST MEDICAL/SURGICAL HISTORY:  Past Medical History:  Diagnosis Date   Anemia    Causing interruption in anticoagulation   Atrial fibrillation and flutter (HCC)    BPH (benign prostatic hypertrophy)    Colon cancer (HCC)    Status post LAR 2008   Hypothyroidism    Lacunar infarction  (HCC) 11/2012   RT 3rd NERVE PALSY, SB Dr. Lita Mains   PE (pulmonary embolism)    Temporarily on Eliquis 2017   Skin cancer    35 years ago   Type 2 diabetes mellitus (HCC)    Vitamin B 12 deficiency 07/10/2019   Past Surgical History:  Procedure Laterality Date   ABDOMINOPERINEAL PROCTOCOLECTOMY  2009   end colostomy    BIOPSY  08/25/2018   Procedure: BIOPSY;  Surgeon: Malissa Hippo, MD;  [facility];  Service: Endoscopy;;   BIOPSY  10/04/2021   Procedure: BIOPSY;  Surgeon: Malissa Hippo, MD;  [facility];  Service: Endoscopy;;  small, bowel   BLADDER REPAIR  08/29/2018   Procedure: BLADDER REPAIR;  Surgeon: Lucretia Roers, MD;  Location: [facility];  Service: General;;   COLECTOMY  2008   COLONOSCOPY WITH PROPOFOL N/A 08/25/2018   Procedure: COLONOSCOPY WITH PROPOFOL;  Surgeon: Malissa Hippo, MD;  [facility];  Service: Endoscopy;  Laterality: N/A;  2:20   COLOSTOMY     EXPLORATORY LAPAROTOMY  2017   ? pneumoperitoneum of unknown etiology/ negative Ex lap   GIVENS CAPSULE STUDY  10/04/2021   Procedure: GIVENS CAPSULE STUDY;  Surgeon: Malissa Hippo, MD;  [facility];  Service: Endoscopy;;   ILEOSCOPY N/A 10/04/2021   Procedure: ILEOSCOPY THROUGH STOMA;  Surgeon: Malissa Hippo, MD;  [facility];  Service: Endoscopy;  Laterality: N/A;   ILEOSTOMY  08/29/2018   Procedure: ILEOSTOMY;  Surgeon: Lucretia Roers, MD;  Location: [facility];  Service: General;;   LAPAROSCOPIC CHOLECYSTECTOMY  2007   LYSIS OF ADHESION  08/29/2018   Procedure: LYSIS OF ADHESION;  Surgeon: Lucretia Roers, MD;  Location: [facility];  Service: General;;   PARTIAL COLECTOMY N/A 08/29/2018   Procedure: PARTIAL COLECTOMY;  Surgeon: Lucretia Roers, MD;  Location: [facility];  Service: General;  Laterality: N/A;   PORTACATH PLACEMENT Left 10/04/2018   Procedure: INSERTION PORT-A-CATH (attached catheter left subclavian);  Surgeon: Lucretia Roers, MD;   Location: [facility];  Service: General;  Laterality: Left;   SBO SURGERY  06/2009    SOCIAL HISTORY:  Social History   Socioeconomic History   Marital status: Married    Spouse name: Not on file   Number of children: Not on file   Years of education: Not on file   Highest education level: Not on file  Occupational History   Occupation: Holiday representative    Comment: [employer]  Tobacco Use   Smoking status: Former    Packs/day: 1.00    Years: 27.00    Total pack years: 27.00    Types: Cigarettes    Start date: 01/27/1964    Quit date: 01/27/1991    Years since quitting: 31.1   Smokeless tobacco: Former    Types: Chew    Quit date: 08/10/1995  Vaping Use   Vaping Use: Never used  Substance and Sexual Activity   Alcohol use: Never    Alcohol/week: 0.0 standard drinks of alcohol   Drug use: Never   Sexual activity: Not Currently  Other Topics Concern   Not on file  Social History Narrative   Not on file   Social Determinants of Health   Financial Resource Strain: Low Risk  (09/01/2020)   Overall Financial Resource Strain (CARDIA)    Difficulty of Paying Living Expenses: Not hard at all  Food Insecurity: No Food Insecurity (09/01/2020)   Hunger Vital Sign    Worried About Running Out of Food in the Last Year: Never true    Ran Out of Food in the Last Year: Never true  Transportation Needs: No Transportation Needs (09/01/2020)   PRAPARE - Hydrologist (Medical): No    Lack of Transportation (Non-Medical): No  Physical Activity: Sufficiently Active (09/01/2020)   Exercise Vital Sign    Days of Exercise per Week: 7 days    Minutes of Exercise per Session: 30 min  Stress: No Stress Concern Present (09/01/2020)   [place]    Feeling of Stress : Not at all  Social Connections: [place] (09/01/2020)   Social Connection and Isolation Panel [NHANES]    Frequency of Communication with  Friends and Family: More than three times a week    Frequency of Social Gatherings with Friends and Family: More than three times a week    Attends Religious Services: More than 4 times per year    Active Member of [employer] or Organizations: Yes    Attends Music therapist: More than 4 times per year    Marital Status: Married  Human resources officer Violence: Not At Risk (09/01/2020)   Humiliation, Afraid, Rape, and Kick questionnaire    Fear of Current or Ex-Partner: No    Emotionally Abused: No    Physically Abused: No    Sexually Abused: No    FAMILY HISTORY:  Family History  Problem Relation Age of Onset   Heart disease Father    Heart failure Father    Heart attack Father    Prostate cancer Paternal Uncle     CURRENT MEDICATIONS:  Current Outpatient Medications  Medication Sig Dispense Refill   cephALEXin (KEFLEX) 500 MG capsule Take 500 mg by mouth 2 (two) times daily.     ferrous sulfate (FERROUSUL) 325 (65 FE) MG tablet Take 1 tablet (325 mg total) by mouth 2 (two) times daily with a meal. 60 tablet 3   glipiZIDE (GLUCOTROL) 10 MG tablet Take 10 mg by mouth 2 (two) times daily.     HYDROcodone-acetaminophen (NORCO/VICODIN) 5-325 MG tablet Take 2 tablets by mouth every 6 (six) hours as needed for moderate pain. 20 tablet 0   hydrocortisone (ANUSOL-HC) 2.5 % rectal cream Apply 1 application topically 2 (two) times daily. Apply Sparingly Twice Daily to Ostomy area 30 g 0   lactose free nutrition (BOOST) LIQD Take 237 mLs by mouth 3 (three) times daily between meals.     levothyroxine (SYNTHROID, LEVOTHROID) 50 MCG tablet Take 50 mcg by mouth daily before breakfast.     loperamide (IMODIUM) 2 MG capsule Take 2 capsules (4 mg total) by mouth 4 (four) times daily -  before meals and at bedtime. 240 capsule 3   metoprolol tartrate (LOPRESSOR) 25 MG tablet Take 0.5 tablets (12.5 mg total) by mouth 2 (two) times daily. Hold if Heart Rate < 70 bpm 30 tablet 2   ONETOUCH VERIO  test strip 1 each 2 (two) times daily.     pioglitazone (ACTOS) 15 MG tablet Take by mouth.     tamsulosin (FLOMAX) 0.4 MG CAPS  capsule Take 1 capsule (0.4 mg total) by mouth daily after supper. 30 capsule 3   Vitamin D, Ergocalciferol, (DRISDOL) 1.25 MG (50000 UNIT) CAPS capsule Take 1 capsule by mouth once a week 16 capsule 0   No current facility-administered medications for this visit.    ALLERGIES:  No Known Allergies  PHYSICAL EXAM:  Performance status (ECOG): 2 - Symptomatic, <50% confined to bed  Vitals:   03/30/22 1112  BP: 129/73  Pulse: 60  Resp: 18  Temp: 97.7 F (36.5 C)  SpO2: 98%   Wt Readings from Last 3 Encounters:  03/30/22 185 lb (83.9 kg)  10/26/21 190 lb (86.2 kg)  10/03/21 190 lb (86.2 kg)   Physical Exam Vitals reviewed.  Constitutional:      Appearance: Normal appearance.  Cardiovascular:     Rate and Rhythm: Normal rate and regular rhythm.     Pulses: Normal pulses.     Heart sounds: Normal heart sounds.  Pulmonary:     Effort: Pulmonary effort is normal.     Breath sounds: Normal breath sounds.  Abdominal:     Palpations: Abdomen is soft. There is no hepatomegaly, splenomegaly or mass.     Tenderness: There is no abdominal tenderness.  Neurological:     General: No focal deficit present.     Mental Status: He is alert and oriented to person, place, and time.  Psychiatric:        Mood and Affect: Mood normal.        Behavior: Behavior normal.      LABORATORY DATA:  I have reviewed the labs as listed.     Latest Ref Rng & Units 03/23/2022    8:56 AM 10/04/2021    5:16 AM 10/03/2021   11:48 AM  CBC  WBC 4.0 - 10.5 K/uL 5.9  5.6  4.0   Hemoglobin 13.0 - 17.0 g/dL 15.1  13.6  13.8   Hematocrit 39.0 - 52.0 % 44.5  41.3  40.4   Platelets 150 - 400 K/uL 90  81  69       Latest Ref Rng & Units 03/23/2022    8:56 AM 10/04/2021    5:16 AM 10/03/2021   11:48 AM  CMP  Glucose 70 - 99 mg/dL 108  85  236   BUN 8 - 23 mg/dL $Remove'20  13  17    'nbWBweU$ Creatinine 0.61 - 1.24 mg/dL 1.31  1.08  1.24   Sodium 135 - 145 mmol/L 137  137  133   Potassium 3.5 - 5.1 mmol/L 4.3  3.7  4.8   Chloride 98 - 111 mmol/L 107  104  105   CO2 22 - 32 mmol/L $RemoveB'25  26  25   'xSeuQuEL$ Calcium 8.9 - 10.3 mg/dL 9.5  9.1  8.8   Total Protein 6.5 - 8.1 g/dL 7.9   6.8   Total Bilirubin 0.3 - 1.2 mg/dL 1.8   0.8   Alkaline Phos 38 - 126 U/L 189   138   AST 15 - 41 U/L 56   48   ALT 0 - 44 U/L 45   34     DIAGNOSTIC IMAGING:  I have independently reviewed the scans and discussed with the patient. CT Abdomen Pelvis W Contrast  Result Date: 03/24/2022 CLINICAL DATA:  History of stage II/III cancer of the transverse colon with resection and colostomy status post chemo and radiation. * Tracking Code: BO * EXAM: CT ABDOMEN AND PELVIS WITH CONTRAST TECHNIQUE: Multidetector CT  imaging of the abdomen and pelvis was performed using the standard protocol following bolus administration of intravenous contrast. RADIATION DOSE REDUCTION: This exam was performed according to the departmental dose-optimization program which includes automated exposure control, adjustment of the mA and/or kV according to patient size and/or use of iterative reconstruction technique. CONTRAST:  171mL OMNIPAQUE IOHEXOL 300 MG/ML  SOLN COMPARISON:  Multiple priors including most recent CT October 03, 2021. FINDINGS: Lower chest: No acute abnormality. Hiatal hernia contains fat and fluid similar prior. Hepatobiliary: No suspicious hepatic lesion. Gallbladder surgically absent. No biliary ductal dilation. Pancreas: No pancreatic ductal dilation or evidence of acute inflammation. Spleen: No splenomegaly. Adrenals/Urinary Tract: Bilateral adrenal glands appear normal. No hydronephrosis. Kidneys demonstrate symmetric enhancement and excretion of contrast material. Stomach/Bowel: Radiopaque enteric contrast material traverses the anterior abdominal wall ostomy. 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 Stable  heterogeneous ill-defined soft tissue/fluid and calcifications in the presacral space. No evidence of bowel obstruction or acute bowel inflammation. Vascular/Lymphatic: Normal caliber abdominal aorta. No pathologically enlarged abdominal or pelvic lymph nodes. Reproductive: Prostate is unremarkable. Other: No significant abdominopelvic free fluid. Ventral abdominal wall laxity. Musculoskeletal: No aggressive lytic or blastic lesion of bone. Diffuse demineralization of bone. Multilevel degenerative changes spine. Degenerative changes bilateral hips. IMPRESSION: 1. Stable post treatment changes in the presacral region. 2. No evidence of recurrent or metastatic disease in the abdomen or pelvis. 3.  Aortic Atherosclerosis (ICD10-I70.0). Electronically Signed   By: Dahlia Bailiff M.D.   On: 03/24/2022 14:46     ASSESSMENT:  1.  Stage IIb (T4N0) transverse colon adenocarcinoma, MSI stable: -History of colon cancer in 2008, status post resection, did not receive any adjuvant chemo. -Recurrence in February 2009, underwent resection with colostomy, followed by 6 months of FOLFOX with radiation. -12 cycles of adjuvant chemotherapy with infusional 5-FU/leucovorin from 10/09/2018-03/21/2019. -CTAP on 10/08/2019 did not show any evidence of recurrence. -CTAP on 04/21/2020 shows unchanged postoperative appearance of the low pelvis and presacral soft tissues with no evidence of recurrence or metastatic disease.   PLAN:  1.  Stage IIb (T4N0) transverse colon adenocarcinoma, MSI stable: - No change in the consistency of stools in the colostomy bag.  No bleeding. - Labs show elevated AST and ALT of 56 and 45.  They have been elevated since the beginning of the year.  He reports taking Tylenol twice daily for low back pain.  He was told to alternate with ibuprofen. - CT [facility] (03/23/2022): No hepatic lesions.  No evidence of recurrence or metastatic disease. - CEA was normal at 3.5. - RTC 6 months for follow-up with repeat  labs.   2.  B12 Deficiency: - Continue B12 supplements.  B12 is 352.  3.  Vitamin D deficiency: - Can continue vitamin D supplements.  Vitamin D is 36.   4.  Moderate thrombocytopenia: - CT scan on 03/23/2022 with no splenomegaly. - Mild to moderate thrombocytopenia stable.  Platelet count is 90.  Differential diagnosis includes immune mediated thrombocytopenia.    Orders placed this encounter:  No orders of the defined types were placed in this encounter.    Derek Jack, MD [place] [phone]

## 2022-03-30 NOTE — Patient Instructions (Addendum)
[place] at [facility] Discharge Instructions   You were seen and examined today by Dr. Delton Coombes.  He reviewed the results of your lab work which are mostly normal/stable. Your liver enzymes are slightly elevated.   Your CT scan and CEA (cancer marker) were normal.  We will see you back in 6 months. We will repeat lab work prior to this visit.    Thank you for choosing [place] at [facility] to provide your oncology and hematology care.  To afford each patient quality time with our provider, please arrive at least 15 minutes before your scheduled appointment time.   If you have a lab appointment with the [facility] please come in thru the Main Entrance and check in at the main information desk.  You need to re-schedule your appointment should you arrive 10 or more minutes late.  We strive to give you quality time with our providers, and arriving late affects you and other patients whose appointments are after yours.  Also, if you no show three or more times for appointments you may be dismissed from the clinic at the providers discretion.     Again, thank you for choosing [facility].  Our hope is that these requests will decrease the amount of time that you wait before being seen by our physicians.       _____________________________________________________________  Should you have questions after your visit to [facility], please contact our office at [phone] and follow the prompts.  Our office hours are 8:00 a.m. and 4:30 p.m. Monday - Friday.  Please note that voicemails left after 4:00 p.m. may not be returned until the following business day.  We are closed weekends and major holidays.  You do have access to a nurse 24-7, just call the main number to the clinic [phone] and do not press any options, hold on the line and a nurse will answer the phone.    For prescription refill requests, have  your pharmacy contact our office and allow 72 hours.    Due to Covid, you will need to wear a mask upon entering the hospital. If you do not have a mask, a mask will be given to you at the Main Entrance upon arrival. For doctor visits, patients may have 1 support person age 78 or older with them. For treatment visits, patients can not have anyone with them due to social distancing guidelines and our immunocompromised population.

## 2022-04-05 DIAGNOSIS — I4891 Unspecified atrial fibrillation: Secondary | ICD-10-CM | POA: Diagnosis not present

## 2022-04-05 DIAGNOSIS — D492 Neoplasm of unspecified behavior of bone, soft tissue, and skin: Secondary | ICD-10-CM | POA: Diagnosis not present

## 2022-04-05 DIAGNOSIS — Z299 Encounter for prophylactic measures, unspecified: Secondary | ICD-10-CM | POA: Diagnosis not present

## 2022-04-05 DIAGNOSIS — I10 Essential (primary) hypertension: Secondary | ICD-10-CM | POA: Diagnosis not present

## 2022-04-06 ENCOUNTER — Encounter (HOSPITAL_COMMUNITY): Payer: Self-pay | Admitting: Hematology

## 2022-04-13 DIAGNOSIS — E1165 Type 2 diabetes mellitus with hyperglycemia: Secondary | ICD-10-CM | POA: Diagnosis not present

## 2022-04-13 DIAGNOSIS — Z299 Encounter for prophylactic measures, unspecified: Secondary | ICD-10-CM | POA: Diagnosis not present

## 2022-04-13 DIAGNOSIS — I10 Essential (primary) hypertension: Secondary | ICD-10-CM | POA: Diagnosis not present

## 2022-04-14 DIAGNOSIS — C4441 Basal cell carcinoma of skin of scalp and neck: Secondary | ICD-10-CM | POA: Diagnosis not present

## 2022-04-14 DIAGNOSIS — C44622 Squamous cell carcinoma of skin of right upper limb, including shoulder: Secondary | ICD-10-CM | POA: Diagnosis not present

## 2022-05-03 ENCOUNTER — Telehealth: Payer: Self-pay | Admitting: *Deleted

## 2022-05-03 NOTE — Telephone Encounter (Signed)
Received call from patient.   Requested order for colostomy supplies. Advised to contact PCP for order.   Verbalized understanding.

## 2022-05-11 ENCOUNTER — Ambulatory Visit: Payer: Medicare Other | Admitting: General Surgery

## 2022-06-02 DIAGNOSIS — Z23 Encounter for immunization: Secondary | ICD-10-CM | POA: Diagnosis not present

## 2022-06-11 ENCOUNTER — Other Ambulatory Visit (HOSPITAL_COMMUNITY): Payer: Self-pay | Admitting: Student

## 2022-06-11 ENCOUNTER — Ambulatory Visit (HOSPITAL_COMMUNITY)
Admission: RE | Admit: 2022-06-11 | Discharge: 2022-06-11 | Disposition: A | Discharge status: Home / Self Care | Payer: Medicare Other | Source: Ambulatory Visit | Attending: Student | Admitting: Student

## 2022-06-11 DIAGNOSIS — M5136 Other intervertebral disc degeneration, lumbar region: Secondary | ICD-10-CM | POA: Diagnosis not present

## 2022-06-11 DIAGNOSIS — N390 Urinary tract infection, site not specified: Secondary | ICD-10-CM | POA: Diagnosis not present

## 2022-06-11 DIAGNOSIS — R319 Hematuria, unspecified: Secondary | ICD-10-CM | POA: Diagnosis not present

## 2022-06-11 DIAGNOSIS — R339 Retention of urine, unspecified: Secondary | ICD-10-CM | POA: Diagnosis not present

## 2022-06-11 DIAGNOSIS — Q7649 Other congenital malformations of spine, not associated with scoliosis: Secondary | ICD-10-CM | POA: Diagnosis not present

## 2022-06-11 DIAGNOSIS — Z299 Encounter for prophylactic measures, unspecified: Secondary | ICD-10-CM | POA: Diagnosis not present

## 2022-06-11 DIAGNOSIS — R81 Glycosuria: Secondary | ICD-10-CM | POA: Diagnosis not present

## 2022-06-11 DIAGNOSIS — I10 Essential (primary) hypertension: Secondary | ICD-10-CM | POA: Diagnosis not present

## 2022-06-11 DIAGNOSIS — Z9049 Acquired absence of other specified parts of digestive tract: Secondary | ICD-10-CM | POA: Diagnosis not present

## 2022-06-15 DIAGNOSIS — D0462 Carcinoma in situ of skin of left upper limb, including shoulder: Secondary | ICD-10-CM | POA: Diagnosis not present

## 2022-06-15 DIAGNOSIS — C44619 Basal cell carcinoma of skin of left upper limb, including shoulder: Secondary | ICD-10-CM | POA: Diagnosis not present

## 2022-06-15 DIAGNOSIS — Z08 Encounter for follow-up examination after completed treatment for malignant neoplasm: Secondary | ICD-10-CM | POA: Diagnosis not present

## 2022-06-15 DIAGNOSIS — Z85828 Personal history of other malignant neoplasm of skin: Secondary | ICD-10-CM | POA: Diagnosis not present

## 2022-06-25 DIAGNOSIS — Z299 Encounter for prophylactic measures, unspecified: Secondary | ICD-10-CM | POA: Diagnosis not present

## 2022-06-25 DIAGNOSIS — R319 Hematuria, unspecified: Secondary | ICD-10-CM | POA: Diagnosis not present

## 2022-06-25 DIAGNOSIS — I10 Essential (primary) hypertension: Secondary | ICD-10-CM | POA: Diagnosis not present

## 2022-07-11 DIAGNOSIS — U071 COVID-19: Secondary | ICD-10-CM | POA: Diagnosis not present

## 2022-07-21 ENCOUNTER — Other Ambulatory Visit (HOSPITAL_COMMUNITY): Payer: Self-pay | Admitting: Hematology

## 2022-07-21 DIAGNOSIS — E559 Vitamin D deficiency, unspecified: Secondary | ICD-10-CM

## 2022-08-03 DIAGNOSIS — Z1331 Encounter for screening for depression: Secondary | ICD-10-CM | POA: Diagnosis not present

## 2022-08-03 DIAGNOSIS — E1165 Type 2 diabetes mellitus with hyperglycemia: Secondary | ICD-10-CM | POA: Diagnosis not present

## 2022-08-03 DIAGNOSIS — Z125 Encounter for screening for malignant neoplasm of prostate: Secondary | ICD-10-CM | POA: Diagnosis not present

## 2022-08-03 DIAGNOSIS — Z299 Encounter for prophylactic measures, unspecified: Secondary | ICD-10-CM | POA: Diagnosis not present

## 2022-08-03 DIAGNOSIS — R5383 Other fatigue: Secondary | ICD-10-CM | POA: Diagnosis not present

## 2022-08-03 DIAGNOSIS — Z0000 Encounter for general adult medical examination without abnormal findings: Secondary | ICD-10-CM | POA: Diagnosis not present

## 2022-08-03 DIAGNOSIS — Z1339 Encounter for screening examination for other mental health and behavioral disorders: Secondary | ICD-10-CM | POA: Diagnosis not present

## 2022-08-03 DIAGNOSIS — Z79899 Other long term (current) drug therapy: Secondary | ICD-10-CM | POA: Diagnosis not present

## 2022-08-03 DIAGNOSIS — Z87891 Personal history of nicotine dependence: Secondary | ICD-10-CM | POA: Diagnosis not present

## 2022-08-03 DIAGNOSIS — Z7189 Other specified counseling: Secondary | ICD-10-CM | POA: Diagnosis not present

## 2022-08-03 DIAGNOSIS — E039 Hypothyroidism, unspecified: Secondary | ICD-10-CM | POA: Diagnosis not present

## 2022-08-03 DIAGNOSIS — I10 Essential (primary) hypertension: Secondary | ICD-10-CM | POA: Diagnosis not present

## 2022-08-03 DIAGNOSIS — E7800 Pure hypercholesterolemia, unspecified: Secondary | ICD-10-CM | POA: Diagnosis not present

## 2022-08-17 DIAGNOSIS — X32XXXD Exposure to sunlight, subsequent encounter: Secondary | ICD-10-CM | POA: Diagnosis not present

## 2022-08-17 DIAGNOSIS — D0462 Carcinoma in situ of skin of left upper limb, including shoulder: Secondary | ICD-10-CM | POA: Diagnosis not present

## 2022-08-17 DIAGNOSIS — Z08 Encounter for follow-up examination after completed treatment for malignant neoplasm: Secondary | ICD-10-CM | POA: Diagnosis not present

## 2022-08-17 DIAGNOSIS — D0461 Carcinoma in situ of skin of right upper limb, including shoulder: Secondary | ICD-10-CM | POA: Diagnosis not present

## 2022-08-17 DIAGNOSIS — Z85828 Personal history of other malignant neoplasm of skin: Secondary | ICD-10-CM | POA: Diagnosis not present

## 2022-08-17 DIAGNOSIS — L570 Actinic keratosis: Secondary | ICD-10-CM | POA: Diagnosis not present

## 2022-08-19 ENCOUNTER — Encounter (INDEPENDENT_AMBULATORY_CARE_PROVIDER_SITE_OTHER): Payer: Self-pay | Admitting: *Deleted

## 2022-09-16 ENCOUNTER — Encounter (INDEPENDENT_AMBULATORY_CARE_PROVIDER_SITE_OTHER): Payer: Self-pay | Admitting: Gastroenterology

## 2022-09-16 ENCOUNTER — Ambulatory Visit (INDEPENDENT_AMBULATORY_CARE_PROVIDER_SITE_OTHER): Payer: Medicare Other | Admitting: Gastroenterology

## 2022-09-16 VITALS — BP 139/71 | HR 45 | Temp 97.3°F | Ht 72.0 in | Wt 183.3 lb

## 2022-09-16 DIAGNOSIS — Z1159 Encounter for screening for other viral diseases: Secondary | ICD-10-CM | POA: Diagnosis not present

## 2022-09-16 DIAGNOSIS — R7989 Other specified abnormal findings of blood chemistry: Secondary | ICD-10-CM | POA: Diagnosis not present

## 2022-09-16 DIAGNOSIS — K738 Other chronic hepatitis, not elsewhere classified: Secondary | ICD-10-CM

## 2022-09-16 NOTE — Progress Notes (Addendum)
Jose Lawrence, M.D. Gastroenterology & Hepatology [facility] [address] [place], [place] [address] Primary Care Physician: Glenda Chroman, MD [address] [place] [address] . Referring MD: PCP  Chief Complaint: Elevated LFTs  History of Present Illness: Jose Lawrence is a 82 y.o. male with past medical history of BPH, colon cancer status post low anterior resection and APR with removal of rest of colon, chemotherapy, as well as permanent ileostomy, hypothyroidism, history of lacunar brain infarction, type 2 diabetes, who presents for evaluation of elevated liver enzymes.    The patient denies any complaints  Such as nausea, vomiting, fever, chills, hematochezia, melena, hematemesis, abdominal distention, abdominal pain, diarrhea, jaundice, pruritus or weight loss. states that he did not know he had liver issues in the past.  He  was referred for evaluation of liver enzyme elevation  Patient brings most recent blood workup from 08/03/2022.  TSH was 2.6, WBC 3.4, hemoglobin 14.3, platelets 73,000, creatinine 1.08, BUN 9, glucose 132, sodium 138, albumin 3.4, total bilirubin 2.9, alkaline phosphatase 214, AST 76, ALT 60.  Notably, upon review of his medical chart, at least since2020 he had constant inversion of his AST to ALT ratio but at that time his aminotransferases were not elevated.  However since August 2020 he had a mild higher elevation of his aminotransferases and fluctuation of his total bilirubin.  These tests slowly uptrending for the time.  Notably, his alkaline phosphatase only increased around May 2022.  INR in 10/03/2021 was 1.1.  Most recent CT of the abdomen and pelvis with IV contrast did not show any cirrhotic changes.  Only recent medication was ferrous sulfate, which was prescribed close to a year ago. Does not take any tea, herbs or supplements. Has not started any  other new medications in the last couple of years.    Notably, he received 5-FU/leucovorin from 10/09/2018-03/21/2019 (12 cycles).  Last ileoscopy performed on 10/04/2021, found to have congested mucosa in the distal ileum and ileus..  Biopsies only showed mucosal hyperemia. Last capsule endoscopy was performed 10/04/2021, to have multiple petechia at the start of capsule.  Presence of small amount of blood due to small bowel biopsy, also presence of marked edema and erythema at the stoma.  FHx: neg for any gastrointestinal/liver disease, no malignancies Social: neg smoking, alcohol or illicit drug use Surgical: Cholecystectomy, total colectomy with end ileostomy, small bowel perforation, lysis of additions  Past Medical History: Past Medical History:  Diagnosis Date   Anemia    Causing interruption in anticoagulation   Atrial fibrillation and flutter (HCC)    BPH (benign prostatic hypertrophy)    Colon cancer ([place])    Status post LAR 2008   Hypothyroidism    Lacunar infarction ([place]) 11/2012   RT 3rd NERVE PALSY, SB [name]   PE (pulmonary embolism)    Temporarily on Eliquis 2017   Skin cancer    35 years ago   Type 2 diabetes mellitus ([place])    Vitamin B 12 deficiency 07/10/2019    Past Surgical History: Past Surgical History:  Procedure Laterality Date   ABDOMINOPERINEAL PROCTOCOLECTOMY  2009   end colostomy    BIOPSY  08/25/2018   Procedure: BIOPSY;  Surgeon: Rogene Houston, MD;  [facility];  Service: Endoscopy;;   BIOPSY  10/04/2021   Procedure: BIOPSY;  Surgeon: Rogene Houston, MD;  [facility];  Service: Endoscopy;;  small, bowel   BLADDER REPAIR  08/29/2018   Procedure: BLADDER REPAIR;  Surgeon: [name], MD;  Location: [facility];  Service: General;;   COLECTOMY  2008   COLONOSCOPY WITH PROPOFOL N/A 08/25/2018   Procedure: COLONOSCOPY WITH PROPOFOL;  Surgeon: Rogene Houston, MD;  [facility];  Service: Endoscopy;  Laterality: N/A;  2:20   COLOSTOMY     EXPLORATORY  LAPAROTOMY  2017   ? pneumoperitoneum of unknown etiology/ negative Ex lap   GIVENS CAPSULE STUDY  10/04/2021   Procedure: GIVENS CAPSULE STUDY;  Surgeon: Rogene Houston, MD;  [facility];  Service: Endoscopy;;   ILEOSCOPY N/A 10/04/2021   Procedure: ILEOSCOPY THROUGH STOMA;  Surgeon: Rogene Houston, MD;  [facility];  Service: Endoscopy;  Laterality: N/A;   ILEOSTOMY  08/29/2018   Procedure: ILEOSTOMY;  Surgeon: [name], MD;  Location: [facility];  Service: General;;   LAPAROSCOPIC CHOLECYSTECTOMY  2007   LYSIS OF ADHESION  08/29/2018   Procedure: LYSIS OF ADHESION;  Surgeon: [name], MD;  Location: [facility];  Service: General;;   PARTIAL COLECTOMY N/A 08/29/2018   Procedure: PARTIAL COLECTOMY;  Surgeon: [name], MD;  Location: [facility];  Service: General;  Laterality: N/A;   PORTACATH PLACEMENT Left 10/04/2018   Procedure: INSERTION PORT-A-CATH (attached catheter left subclavian);  Surgeon: [name], MD;  Location: [facility];  Service: General;  Laterality: Left;   SBO SURGERY  06/2009    Family History: Family History  Problem Relation Age of Onset   Heart disease Father    Heart failure Father    Heart attack Father    Prostate cancer Paternal Uncle     Social History: Social History   Tobacco Use  Smoking Status Former   Packs/day: 1.00   Years: 27.00   Total pack years: 27.00   Types: Cigarettes   Start date: 01/27/1964   Quit date: 01/27/1991   Years since quitting: 31.6  Smokeless Tobacco Former   Types: Chew   Quit date: 08/10/1995   Social History   Substance and Sexual Activity  Alcohol Use Never   Alcohol/week: 0.0 standard drinks of alcohol   Social History   Substance and Sexual Activity  Drug Use Never    Allergies: No Known Allergies  Medications: Current Outpatient Medications  Medication Sig Dispense Refill   ferrous sulfate (FERROUSUL) 325 (65 FE) MG tablet Take 1 tablet (325 mg total) by  mouth 2 (two) times daily with a meal. 60 tablet 3   glipiZIDE (GLUCOTROL) 10 MG tablet Take 10 mg by mouth 2 (two) times daily. 10 mg in am and 5 mg QHS     lactose free nutrition (BOOST) LIQD Take 237 mLs by mouth daily at 6 (six) AM.     levothyroxine (SYNTHROID, LEVOTHROID) 50 MCG tablet Take 50 mcg by mouth daily before breakfast.     loperamide (IMODIUM) 2 MG capsule Take 2 capsules (4 mg total) by mouth 4 (four) times daily -  before meals and at bedtime. 240 capsule 3   MAGNESIUM GLYCINATE PO Take 240 mg by mouth daily at 6 (six) AM.     metoprolol tartrate (LOPRESSOR) 25 MG tablet Take 0.5 tablets (12.5 mg total) by mouth 2 (two) times daily. Hold if Heart Rate < 70 bpm 30 tablet 2   ONETOUCH VERIO test strip 1 each 2 (two) times daily.     tamsulosin (FLOMAX) 0.4 MG CAPS capsule Take 1 capsule (0.4 mg total)  by mouth daily after supper. 30 capsule 3   Vitamin D, Ergocalciferol, (DRISDOL) 1.25 MG (50000 UNIT) CAPS capsule Take 1 capsule by mouth once a week 16 capsule 0   No current facility-administered medications for this visit.    Review of Systems: GENERAL: negative for malaise, night sweats HEENT: No changes in hearing or vision, no nose bleeds or other nasal problems. NECK: Negative for lumps, goiter, pain and significant neck swelling RESPIRATORY: Negative for cough, wheezing CARDIOVASCULAR: Negative for chest pain, leg swelling, palpitations, orthopnea GI: SEE HPI MUSCULOSKELETAL: Negative for joint pain or swelling, back pain, and muscle pain. SKIN: Negative for lesions, rash PSYCH: Negative for sleep disturbance, mood disorder and recent psychosocial stressors. HEMATOLOGY Negative for prolonged bleeding, bruising easily, and swollen nodes. ENDOCRINE: Negative for cold or heat intolerance, polyuria, polydipsia and goiter. NEURO: negative for tremor, gait imbalance, syncope and seizures. The remainder of the review of systems is noncontributory.   Physical Exam: BP  139/71 (BP Location: Left Arm, Patient Position: Sitting, Cuff Size: Normal)   Pulse (!) 45   Temp (!) 97.3 F (36.3 C) (Temporal)   Ht 6' (1.829 m)   Wt 183 lb 4.8 oz (83.1 kg)   BMI 24.86 kg/m  GENERAL: The patient is AO x3, in no acute distress. HEENT: Head is normocephalic and atraumatic. EOMI are intact. Mouth is well hydrated and without lesions. NECK: Supple. No masses LUNGS: Clear to auscultation. No presence of rhonchi/wheezing/rales. Adequate chest expansion HEART: RRR, normal s1 and s2. ABDOMEN: Soft, nontender, no guarding, no peritoneal signs, and nondistended. BS +. No masses. Has ileostomy bag. EXTREMITIES: Without any cyanosis, clubbing, rash, lesions or edema. NEUROLOGIC: AOx3, no focal motor deficit. SKIN: no jaundice, no rashes  Imaging/Labs: as above  I personally reviewed and interpreted the available labs, imaging and endoscopic files.  Impression and Plan: MILNER BRENNEKE is a 82 y.o. male with past medical history of BPH, colon cancer status post low anterior resection and APR with removal of rest of colon, chemotherapy, as well as permanent ileostomy, hypothyroidism, history of lacunar brain infarction, type 2 diabetes, who presents for evaluation of elevated liver enzymes.  He does not drink any alcohol.Patient had slow uprise in his liver enzymes for the last couple years.  Enzymes are presenting a mixed pattern.  This is concerning for different surgery such as drug-induced liver injury versus PSC, however other reversible causes (viral, autoimmune and metabolic) will also be evaluated with serology which will be ordered today.  His biochemistry suggest some liver fibrosis but his most recent imaging did not show this abnormality -will confirm this with an elastography.  He is not currently taking any medication that could explain his liver enzyme elevation.  The only new medicine that was started from the time his enzymes increased was iron supplementation,  but toxicity is only seen with higher doses of ferrous sulfate.  Notably, he received 5-FU as the treatment for his colon cancer.  Rarely, this medication could lead to changes suggestive of sclerosing cholangitis, although his most recent imaging with a CT of the abdomen did not show any biliary abnormalities.  Ultimately, depending on the blood workup, we will need to proceed with a liver biopsy for final diagnosis.  -Check CBC, CMP,hepatitis A/B/C serologies, iron panel, ANA, AMA, ASMA, IgG,A1AT - Schedule liver elastography  All questions were answered.      Jose Peppers, MD Gastroenterology and Hepatology [facility]

## 2022-09-16 NOTE — Patient Instructions (Addendum)
Perform blood workup Schedule liver elastography

## 2022-09-27 ENCOUNTER — Inpatient Hospital Stay: Payer: Medicare Other | Attending: Hematology

## 2022-09-27 ENCOUNTER — Other Ambulatory Visit (HOSPITAL_COMMUNITY)
Admission: RE | Admit: 2022-09-27 | Discharge: 2022-09-27 | Disposition: A | Discharge status: Home / Self Care | Payer: Medicare Other | Source: Ambulatory Visit | Attending: Gastroenterology | Admitting: Gastroenterology

## 2022-09-27 DIAGNOSIS — E559 Vitamin D deficiency, unspecified: Secondary | ICD-10-CM

## 2022-09-27 DIAGNOSIS — K738 Other chronic hepatitis, not elsewhere classified: Secondary | ICD-10-CM | POA: Insufficient documentation

## 2022-09-27 DIAGNOSIS — R7989 Other specified abnormal findings of blood chemistry: Secondary | ICD-10-CM | POA: Insufficient documentation

## 2022-09-27 DIAGNOSIS — C184 Malignant neoplasm of transverse colon: Secondary | ICD-10-CM | POA: Diagnosis not present

## 2022-09-27 DIAGNOSIS — E538 Deficiency of other specified B group vitamins: Secondary | ICD-10-CM

## 2022-09-27 DIAGNOSIS — Z1159 Encounter for screening for other viral diseases: Secondary | ICD-10-CM | POA: Insufficient documentation

## 2022-09-27 DIAGNOSIS — Z8673 Personal history of transient ischemic attack (TIA), and cerebral infarction without residual deficits: Secondary | ICD-10-CM | POA: Diagnosis not present

## 2022-09-27 LAB — CBC WITH DIFFERENTIAL/PLATELET
Abs Immature Granulocytes: 0.02 10*3/uL (ref 0.00–0.07)
Basophils Absolute: 0.1 10*3/uL (ref 0.0–0.1)
Basophils Relative: 1 %
Eosinophils Absolute: 0.1 10*3/uL (ref 0.0–0.5)
Eosinophils Relative: 3 %
HCT: 38.9 % — ABNORMAL LOW (ref 39.0–52.0)
Hemoglobin: 13 g/dL (ref 13.0–17.0)
Immature Granulocytes: 0 %
Lymphocytes Relative: 19 %
Lymphs Abs: 0.9 10*3/uL (ref 0.7–4.0)
MCH: 34.9 pg — ABNORMAL HIGH (ref 26.0–34.0)
MCHC: 33.4 g/dL (ref 30.0–36.0)
MCV: 104.6 fL — ABNORMAL HIGH (ref 80.0–100.0)
Monocytes Absolute: 0.4 10*3/uL (ref 0.1–1.0)
Monocytes Relative: 8 %
Neutro Abs: 3.3 10*3/uL (ref 1.7–7.7)
Neutrophils Relative %: 69 %
Platelets: 76 10*3/uL — ABNORMAL LOW (ref 150–400)
RBC: 3.72 MIL/uL — ABNORMAL LOW (ref 4.22–5.81)
RDW: 14.4 % (ref 11.5–15.5)
WBC: 4.7 10*3/uL (ref 4.0–10.5)
nRBC: 0 % (ref 0.0–0.2)

## 2022-09-27 LAB — COMPREHENSIVE METABOLIC PANEL
ALT: 58 U/L — ABNORMAL HIGH (ref 0–44)
AST: 90 U/L — ABNORMAL HIGH (ref 15–41)
Albumin: 3.1 g/dL — ABNORMAL LOW (ref 3.5–5.0)
Alkaline Phosphatase: 162 U/L — ABNORMAL HIGH (ref 38–126)
Anion gap: 4 — ABNORMAL LOW (ref 5–15)
BUN: 14 mg/dL (ref 8–23)
CO2: 27 mmol/L (ref 22–32)
Calcium: 8.7 mg/dL — ABNORMAL LOW (ref 8.9–10.3)
Chloride: 105 mmol/L (ref 98–111)
Creatinine, Ser: 1.14 mg/dL (ref 0.61–1.24)
GFR, Estimated: >60 mL/min (ref >60)
Glucose, Bld: 178 mg/dL — ABNORMAL HIGH (ref 70–99)
Potassium: 4.6 mmol/L (ref 3.5–5.1)
Sodium: 136 mmol/L (ref 135–145)
Total Bilirubin: 3.1 mg/dL — ABNORMAL HIGH (ref 0.3–1.2)
Total Protein: 7 g/dL (ref 6.5–8.1)

## 2022-09-27 LAB — IRON AND TIBC
Iron: 117 ug/dL (ref 45–182)
Saturation Ratios: 57 % — ABNORMAL HIGH (ref 17.9–39.5)
TIBC: 204 ug/dL — ABNORMAL LOW (ref 250–450)
UIBC: 87 ug/dL

## 2022-09-27 LAB — PROTIME-INR
INR: 1.7 — ABNORMAL HIGH (ref 0.8–1.2)
Prothrombin Time: 19.6 seconds — ABNORMAL HIGH (ref 11.4–15.2)

## 2022-09-27 LAB — HEPATITIS B SURFACE ANTIBODY,QUALITATIVE: Hep B S Ab: NONREACTIVE

## 2022-09-27 LAB — HEPATITIS B SURFACE ANTIGEN: Hepatitis B Surface Ag: NONREACTIVE

## 2022-09-27 LAB — VITAMIN B12: Vitamin B-12: 490 pg/mL (ref 180–914)

## 2022-09-27 LAB — HEPATITIS C ANTIBODY: HCV Ab: NONREACTIVE

## 2022-09-27 LAB — HEPATITIS A ANTIBODY, TOTAL: hep A Total Ab: NONREACTIVE

## 2022-09-28 DIAGNOSIS — D0439 Carcinoma in situ of skin of other parts of face: Secondary | ICD-10-CM | POA: Diagnosis not present

## 2022-09-28 DIAGNOSIS — C44329 Squamous cell carcinoma of skin of other parts of face: Secondary | ICD-10-CM | POA: Diagnosis not present

## 2022-09-28 DIAGNOSIS — D0421 Carcinoma in situ of skin of right ear and external auricular canal: Secondary | ICD-10-CM | POA: Diagnosis not present

## 2022-09-28 DIAGNOSIS — Z85828 Personal history of other malignant neoplasm of skin: Secondary | ICD-10-CM | POA: Diagnosis not present

## 2022-09-28 DIAGNOSIS — Z08 Encounter for follow-up examination after completed treatment for malignant neoplasm: Secondary | ICD-10-CM | POA: Diagnosis not present

## 2022-09-28 LAB — IGG: IgG (Immunoglobin G), Serum: 1628 mg/dL — ABNORMAL HIGH (ref 603–1613)

## 2022-09-28 LAB — ANA: Anti Nuclear Antibody (ANA): NEGATIVE

## 2022-09-28 LAB — ANTI-SMOOTH MUSCLE ANTIBODY, IGG: F-Actin IgG: 26 Units — ABNORMAL HIGH (ref 0–19)

## 2022-09-28 LAB — MITOCHONDRIAL ANTIBODIES: Mitochondrial M2 Ab, IgG: <20 Units (ref 0.0–20.0)

## 2022-09-29 ENCOUNTER — Ambulatory Visit (HOSPITAL_COMMUNITY)
Admission: RE | Admit: 2022-09-29 | Discharge: 2022-09-29 | Disposition: A | Discharge status: Home / Self Care | Payer: Medicare Other | Source: Ambulatory Visit | Attending: Gastroenterology | Admitting: Gastroenterology

## 2022-09-29 DIAGNOSIS — R7989 Other specified abnormal findings of blood chemistry: Secondary | ICD-10-CM

## 2022-09-29 DIAGNOSIS — R945 Abnormal results of liver function studies: Secondary | ICD-10-CM | POA: Diagnosis not present

## 2022-09-29 LAB — MISC LABCORP TEST (SEND OUT): Labcorp test code: 81950

## 2022-09-29 LAB — CEA: CEA: 3.8 ng/mL (ref 0.0–4.7)

## 2022-10-03 LAB — ALPHA-1-ANTITRYPSIN PHENOTYP: A-1 Antitrypsin, Ser: 168 mg/dL (ref 101–187)

## 2022-10-04 ENCOUNTER — Other Ambulatory Visit (INDEPENDENT_AMBULATORY_CARE_PROVIDER_SITE_OTHER): Payer: Self-pay | Admitting: *Deleted

## 2022-10-04 ENCOUNTER — Other Ambulatory Visit: Payer: Self-pay | Admitting: *Deleted

## 2022-10-04 ENCOUNTER — Inpatient Hospital Stay (HOSPITAL_BASED_OUTPATIENT_CLINIC_OR_DEPARTMENT_OTHER): Payer: Medicare Other | Admitting: Hematology

## 2022-10-04 VITALS — BP 126/79 | HR 71 | Temp 97.2°F | Resp 16 | Wt 178.4 lb

## 2022-10-04 DIAGNOSIS — D696 Thrombocytopenia, unspecified: Secondary | ICD-10-CM | POA: Insufficient documentation

## 2022-10-04 DIAGNOSIS — C184 Malignant neoplasm of transverse colon: Secondary | ICD-10-CM

## 2022-10-04 DIAGNOSIS — Z933 Colostomy status: Secondary | ICD-10-CM | POA: Diagnosis not present

## 2022-10-04 DIAGNOSIS — Z79899 Other long term (current) drug therapy: Secondary | ICD-10-CM | POA: Diagnosis not present

## 2022-10-04 DIAGNOSIS — Z7984 Long term (current) use of oral hypoglycemic drugs: Secondary | ICD-10-CM | POA: Insufficient documentation

## 2022-10-04 DIAGNOSIS — E538 Deficiency of other specified B group vitamins: Secondary | ICD-10-CM | POA: Insufficient documentation

## 2022-10-04 DIAGNOSIS — Z86711 Personal history of pulmonary embolism: Secondary | ICD-10-CM | POA: Diagnosis not present

## 2022-10-04 DIAGNOSIS — I4891 Unspecified atrial fibrillation: Secondary | ICD-10-CM | POA: Insufficient documentation

## 2022-10-04 DIAGNOSIS — Z85038 Personal history of other malignant neoplasm of large intestine: Secondary | ICD-10-CM | POA: Insufficient documentation

## 2022-10-04 DIAGNOSIS — E559 Vitamin D deficiency, unspecified: Secondary | ICD-10-CM | POA: Diagnosis not present

## 2022-10-04 DIAGNOSIS — E039 Hypothyroidism, unspecified: Secondary | ICD-10-CM | POA: Insufficient documentation

## 2022-10-04 DIAGNOSIS — E119 Type 2 diabetes mellitus without complications: Secondary | ICD-10-CM | POA: Diagnosis not present

## 2022-10-04 DIAGNOSIS — R7989 Other specified abnormal findings of blood chemistry: Secondary | ICD-10-CM

## 2022-10-04 NOTE — Progress Notes (Signed)
[facility] [address], [place] [address]    Clinic Day:  10/04/2022  Referring physician: Glenda Chroman, MD  Patient Care Team: Glenda Chroman, MD as PCP - General (Internal Medicine) [name] Alphonse Guild, MD as PCP - Cardiology (Cardiology)   ASSESSMENT & PLAN:   Assessment: 1.  Stage IIb (T4N0) transverse colon adenocarcinoma, MSI stable: -History of colon cancer in 2008, status post resection, did not receive any adjuvant chemo. -Recurrence in February 2009, underwent resection with colostomy, followed by 6 months of FOLFOX with radiation. -12 cycles of adjuvant chemotherapy with infusional 5-FU/leucovorin from 10/09/2018-03/21/2019. -CTAP on 10/08/2019 did not show any evidence of recurrence. -CTAP on 04/21/2020 shows unchanged postoperative appearance of the low pelvis and presacral soft tissues with no evidence of recurrence or metastatic disease.    Plan: 1.  Stage IIb (T4N0) transverse colon adenocarcinoma, MSI stable: - CTAP on 03/23/2022: No liver lesions or evidence of recurrence or metastatic disease. - Denies any change in stool consistency or blood in the ostomy bag. - LFTs are elevated including AST and ALT 90 and 58 respectively.  Total bilirubin is 3.1.  He is seeing Dr. Jenetta Downer for further workup. - CEA is 3.8.  No evidence of recurrence of his cancer. - Recommend follow-up in 6 months with repeat CTAP with contrast and CEA level.   2.  B12 Deficiency: - He is not on B12 supplements.  B12 is normal at 490.   3.  Vitamin D deficiency: - Vitamin D level is low at 21.  He is taking vitamin D 50,000 units weekly.  I have recommended taking vitamin D 2000 units daily in addition to weekly dose.  Will check in 6 months.   4.  Moderate thrombocytopenia: - CT scan on 03/23/2022 with no splenomegaly. - Differential diagnosis includes MDS/immune mediated thrombocytopenia.  Platelet count on 09/27/2022 was 76.  Will closely monitor.    Orders Placed  This Encounter  Procedures   CT Abdomen Pelvis W Contrast    Standing Status:   Future    Standing Expiration Date:   10/04/2023    Order Specific Question:   If indicated for the ordered procedure, I authorize the administration of contrast media per Radiology protocol    Answer:   Yes    Order Specific Question:   Does the patient have a contrast media/X-ray dye allergy?    Answer:   No    Order Specific Question:   Preferred imaging location?    Answer:   [facility]    Order Specific Question:   Is Oral Contrast requested for this exam?    Answer:   Yes, Per Radiology protocol      Kaleen Odea as a scribe for Derek Jack, MD.,have documented all relevant documentation on the behalf of Derek Jack, MD,as directed by  Derek Jack, MD while in the presence of Derek Jack, MD.   I, Derek Jack MD, have reviewed the above documentation for accuracy and completeness, and I agree with the above.   Doyce Loose   2/26/202411:55 AM  CHIEF COMPLAINT:   Diagnosis: transverse colon cancer    Cancer Staging  Cancer of transverse colon [facility]) Staging form: Colon and Rectum, AJCC 8th Edition - Clinical stage from 09/29/2018: Stage IIB (cT4a, cN0, cM0) - Signed by Derek Jack, MD on 09/29/2018    Prior Therapy: 1. Colon resection with colostomy in 09/2007. 2. FOLFOX and radiation from 09/2007 through 02/2008  3. 12 cycles of infusional 5-FU and leucovorin from 10/09/2018 through 03/21/2019.  Current Therapy:  Surveillance     HISTORY OF PRESENT ILLNESS:   Oncology History  Cancer of transverse colon ([place])  09/23/2018 Initial Diagnosis   Cancer of transverse colon ([place])   09/29/2018 Cancer Staging   Staging form: Colon and Rectum, AJCC 8th Edition - Clinical stage from 09/29/2018: Stage IIB (cT4a, cN0, cM0) - Signed by Derek Jack, MD on 09/29/2018   10/09/2018 - 03/23/2019 Chemotherapy   The patient  had ondansetron (ZOFRAN) 8 mg in sodium chloride 0.9 % 50 mL IVPB, 8 mg (100 % of original dose 8 mg), Intravenous,  Once, 12 of 12 cycles Dose modification: 8 mg (original dose 8 mg, Cycle 1) Administration: 8 mg (10/09/2018), 8 mg (10/23/2018), 8 mg (11/06/2018), 8 mg (11/20/2018), 8 mg (12/04/2018), 8 mg (12/18/2018), 8 mg (01/02/2019), 8 mg (01/15/2019), 8 mg (02/06/2019), 8 mg (02/21/2019), 8 mg (03/07/2019), 8 mg (03/21/2019) leucovorin 800 mg in dextrose 5 % 250 mL infusion, 820 mg, Intravenous,  Once, 12 of 12 cycles Administration: 800 mg (10/09/2018), 820 mg (10/23/2018), 820 mg (11/06/2018), 820 mg (11/20/2018), 820 mg (12/04/2018), 820 mg (12/18/2018), 820 mg (01/02/2019), 820 mg (01/15/2019), 820 mg (02/06/2019), 820 mg (02/21/2019), 820 mg (03/07/2019), 820 mg (03/21/2019) fluorouracil (ADRUCIL) chemo injection 800 mg, 400 mg/m2 = 800 mg, Intravenous,  Once, 12 of 12 cycles Administration: 800 mg (10/09/2018), 800 mg (10/23/2018), 800 mg (11/06/2018), 800 mg (11/20/2018), 800 mg (12/04/2018), 800 mg (12/18/2018), 800 mg (01/02/2019), 800 mg (01/15/2019), 800 mg (02/06/2019), 800 mg (02/21/2019), 800 mg (03/07/2019), 800 mg (03/21/2019) fluorouracil (ADRUCIL) 4,900 mg in sodium chloride 0.9 % 52 mL chemo infusion, 2,400 mg/m2 = 4,900 mg, Intravenous, 1 Day/Dose, 12 of 12 cycles Administration: 4,900 mg (10/09/2018), 4,900 mg (10/23/2018), 5,000 mg (11/06/2018), 5,000 mg (11/20/2018), 5,000 mg (12/04/2018), 5,000 mg (12/18/2018), 5,000 mg (01/02/2019), 5,000 mg (01/15/2019), 5,000 mg (02/06/2019), 5,000 mg (02/21/2019), 5,000 mg (03/07/2019), 5,000 mg (03/21/2019)  for chemotherapy treatment.       INTERVAL HISTORY:   Zac is a 82 y.o. male presenting to clinic today for follow up of transverse colon cancer . He was last seen by me on 03/30/3022.  Today, he states that he is doing well overall. His appetite level is at 100%. His energy level is at 40%. He denies any problem since last visit. He is currently Vitamin D x1 a week.   PAST  MEDICAL HISTORY:   Past Medical History: Past Medical History:  Diagnosis Date   Anemia    Causing interruption in anticoagulation   Atrial fibrillation and flutter (HCC)    BPH (benign prostatic hypertrophy)    Colon cancer ([place])    Status post LAR 2008   Hypothyroidism    Lacunar infarction ([place]) 11/2012   RT 3rd NERVE PALSY, SB [name]   PE (pulmonary embolism)    Temporarily on Eliquis 2017   Skin cancer    35 years ago   Type 2 diabetes mellitus ([place])    Vitamin B 12 deficiency 07/10/2019    Surgical History: Past Surgical History:  Procedure Laterality Date   ABDOMINOPERINEAL PROCTOCOLECTOMY  2009   end colostomy    BIOPSY  08/25/2018   Procedure: BIOPSY;  Surgeon: Rogene Houston, MD;  [facility];  Service: Endoscopy;;   BIOPSY  10/04/2021   Procedure: BIOPSY;  Surgeon: Rogene Houston, MD;  [facility];  Service: Endoscopy;;  small, bowel  BLADDER REPAIR  08/29/2018   Procedure: BLADDER REPAIR;  Surgeon: [name], MD;  Location: [facility];  Service: General;;   COLECTOMY  2008   COLONOSCOPY WITH PROPOFOL N/A 08/25/2018   Procedure: COLONOSCOPY WITH PROPOFOL;  Surgeon: Rogene Houston, MD;  [facility];  Service: Endoscopy;  Laterality: N/A;  2:20   COLOSTOMY     EXPLORATORY LAPAROTOMY  2017   ? pneumoperitoneum of unknown etiology/ negative Ex lap   GIVENS CAPSULE STUDY  10/04/2021   Procedure: GIVENS CAPSULE STUDY;  Surgeon: Rogene Houston, MD;  [facility];  Service: Endoscopy;;   ILEOSCOPY N/A 10/04/2021   Procedure: ILEOSCOPY THROUGH STOMA;  Surgeon: Rogene Houston, MD;  [facility];  Service: Endoscopy;  Laterality: N/A;   ILEOSTOMY  08/29/2018   Procedure: ILEOSTOMY;  Surgeon: [name], MD;  Location: [facility];  Service: General;;   LAPAROSCOPIC CHOLECYSTECTOMY  2007   LYSIS OF ADHESION  08/29/2018   Procedure: LYSIS OF ADHESION;  Surgeon: [name], MD;  Location: [facility];  Service: General;;   PARTIAL COLECTOMY N/A 08/29/2018   Procedure: PARTIAL COLECTOMY;  Surgeon: [name], MD;  Location: [facility];  Service: General;  Laterality: N/A;   PORTACATH PLACEMENT Left 10/04/2018   Procedure: INSERTION PORT-A-CATH (attached catheter left subclavian);  Surgeon: [name], MD;  Location: [facility];  Service: General;  Laterality: Left;   SBO SURGERY  06/2009    Social History: Social History   Socioeconomic History   Marital status: Married    Spouse name: Not on file   Number of children: Not on file   Years of education: Not on file   Highest education level: Not on file  Occupational History   Occupation: Architect    Comment: [employer]  Tobacco Use   Smoking status: Former    Packs/day: 1.00    Years: 27.00    Total pack years: 27.00    Types: Cigarettes    Start date: 01/27/1964    Quit date: 01/27/1991    Years since quitting: 31.7   Smokeless tobacco: Former    Types: Chew    Quit date: 08/10/1995  Vaping Use   Vaping Use: Never used  Substance and Sexual Activity   Alcohol use: Never    Alcohol/week: 0.0 standard drinks of alcohol   Drug use: Never   Sexual activity: Not Currently  Other Topics Concern   Not on file  Social History Narrative   Not on file   Social Determinants of Health   Financial Resource Strain: Low Risk  (09/01/2020)   Overall Financial Resource Strain (CARDIA)    Difficulty of Paying Living Expenses: Not hard at all  Food Insecurity: No [place] (09/01/2020)   Hunger Vital Sign    Worried About Running Out of Food in the Last Year: Never true    Independence in the Last Year: Never true  Transportation Needs: No Transportation Needs (09/01/2020)   PRAPARE - Hydrologist (Medical): No    Lack of Transportation (Non-Medical): No  Physical Activity: Sufficiently Active (09/01/2020)   Exercise Vital Sign    Days of Exercise per Week: 7 days    Minutes of  Exercise per Session: 30 min  Stress: No Stress Concern Present (09/01/2020)   [place]    Feeling of Stress : Not at all  Social  Connections: Socially Integrated (09/01/2020)   Social Connection and Isolation Panel [NHANES]    Frequency of Communication with Friends and Family: More than three times a week    Frequency of Social Gatherings with Friends and Family: More than three times a week    Attends Religious Services: More than 4 times per year    Active Member of [employer] or Organizations: Yes    Attends Music therapist: More than 4 times per year    Marital Status: Married  Human resources officer Violence: Not At Risk (09/01/2020)   Humiliation, Afraid, Rape, and Kick questionnaire    Fear of Current or Ex-Partner: No    Emotionally Abused: No    Physically Abused: No    Sexually Abused: No    Family History: Family History  Problem Relation Age of Onset   Heart disease Father    Heart failure Father    Heart attack Father    Prostate cancer Paternal Uncle     Current Medications:  Current Outpatient Medications:    ferrous sulfate (FERROUSUL) 325 (65 FE) MG tablet, Take 1 tablet (325 mg total) by mouth 2 (two) times daily with a meal., Disp: 60 tablet, Rfl: 3   glipiZIDE (GLUCOTROL) 10 MG tablet, Take 10 mg by mouth 2 (two) times daily. 10 mg in am and 5 mg QHS, Disp: , Rfl:    lactose free nutrition (BOOST) LIQD, Take 237 mLs by mouth daily at 6 (six) AM., Disp: , Rfl:    levothyroxine (SYNTHROID, LEVOTHROID) 50 MCG tablet, Take 50 mcg by mouth daily before breakfast., Disp: , Rfl:    loperamide (IMODIUM) 2 MG capsule, Take 2 capsules (4 mg total) by mouth 4 (four) times daily -  before meals and at bedtime., Disp: 240 capsule, Rfl: 3   MAGNESIUM GLYCINATE PO, Take 240 mg by mouth daily at 6 (six) AM., Disp: , Rfl:    ONETOUCH VERIO test strip, 1 each 2 (two) times daily., Disp: , Rfl:    tamsulosin  (FLOMAX) 0.4 MG CAPS capsule, Take 1 capsule (0.4 mg total) by mouth daily after supper., Disp: 30 capsule, Rfl: 3   Vitamin D, Ergocalciferol, (DRISDOL) 1.25 MG (50000 UNIT) CAPS capsule, Take 1 capsule by mouth once a week, Disp: 16 capsule, Rfl: 0   metoprolol tartrate (LOPRESSOR) 25 MG tablet, Take 0.5 tablets (12.5 mg total) by mouth 2 (two) times daily. Hold if Heart Rate < 70 bpm (Patient not taking: Reported on 10/04/2022), Disp: 30 tablet, Rfl: 2   Allergies: No Known Allergies  REVIEW OF SYSTEMS:   Review of Systems  Constitutional:  Negative for chills, fatigue and fever.  HENT:   Negative for lump/mass, mouth sores, nosebleeds, sore throat and trouble swallowing.   Eyes:  Negative for eye problems.  Respiratory:  Negative for cough and shortness of breath.   Cardiovascular:  Positive for palpitations. Negative for chest pain and leg swelling.  Gastrointestinal:  Negative for abdominal pain, constipation, diarrhea, nausea and vomiting.  Genitourinary:  Negative for bladder incontinence, difficulty urinating, dysuria, frequency, hematuria and nocturia.   Musculoskeletal:  Negative for arthralgias, back pain, flank pain, myalgias and neck pain.  Skin:  Negative for itching and rash.  Neurological:  Positive for dizziness (staggering) and numbness (Feet). Negative for headaches.  Hematological:  Does not bruise/bleed easily.  Psychiatric/Behavioral:  Positive for sleep disturbance. Negative for depression and suicidal ideas. The patient is not nervous/anxious.   All other systems reviewed and are negative.  VITALS:   Blood pressure 126/79, pulse 71, temperature (!) 97.2 F (36.2 C), temperature source Oral, resp. rate 16, weight 178 lb 6.4 oz (80.9 kg), SpO2 100 %.  Wt Readings from Last 3 Encounters:  10/04/22 178 lb 6.4 oz (80.9 kg)  09/16/22 183 lb 4.8 oz (83.1 kg)  03/30/22 185 lb (83.9 kg)    Body mass index is 24.2 kg/m.  Performance status (ECOG): 1 - Symptomatic  but completely ambulatory  PHYSICAL EXAM:   Physical Exam Vitals and nursing note reviewed. Exam conducted with a chaperone present.  Constitutional:      Appearance: Normal appearance.  Cardiovascular:     Rate and Rhythm: Normal rate and regular rhythm.     Pulses: Normal pulses.     Heart sounds: Normal heart sounds.  Pulmonary:     Effort: Pulmonary effort is normal.     Breath sounds: Normal breath sounds.  Abdominal:     Palpations: Abdomen is soft. There is no hepatomegaly, splenomegaly or mass.     Tenderness: There is no abdominal tenderness.  Musculoskeletal:     Right lower leg: No edema.     Left lower leg: No edema.  Lymphadenopathy:     Cervical: No cervical adenopathy.     Right cervical: No superficial, deep or posterior cervical adenopathy.    Left cervical: No superficial, deep or posterior cervical adenopathy.     Upper Body:     Right upper body: No supraclavicular or axillary adenopathy.     Left upper body: No supraclavicular or axillary adenopathy.  Neurological:     General: No focal deficit present.     Mental Status: He is alert and oriented to person, place, and time.  Psychiatric:        Mood and Affect: Mood normal.        Behavior: Behavior normal.     LABS:      Latest Ref Rng & Units 09/27/2022   11:36 AM 03/23/2022    8:56 AM 10/04/2021    5:16 AM  CBC  WBC 4.0 - 10.5 K/uL 4.7  5.9  5.6   Hemoglobin 13.0 - 17.0 g/dL 13.0  15.1  13.6   Hematocrit 39.0 - 52.0 % 38.9  44.5  41.3   Platelets 150 - 400 K/uL 76  90  81       Latest Ref Rng & Units 09/27/2022   11:36 AM 03/23/2022    8:56 AM 10/04/2021    5:16 AM  CMP  Glucose 70 - 99 mg/dL 178  108  85   BUN 8 - 23 mg/dL '14  20  13   '$ Creatinine 0.61 - 1.24 mg/dL 1.14  1.31  1.08   Sodium 135 - 145 mmol/L 136  137  137   Potassium 3.5 - 5.1 mmol/L 4.6  4.3  3.7   Chloride 98 - 111 mmol/L 105  107  104   CO2 22 - 32 mmol/L '27  25  26   '$ Calcium 8.9 - 10.3 mg/dL 8.7  9.5  9.1   Total  Protein 6.5 - 8.1 g/dL 7.0  7.9    Total Bilirubin 0.3 - 1.2 mg/dL 3.1  1.8    Alkaline Phos 38 - 126 U/L 162  189    AST 15 - 41 U/L 90  56    ALT 0 - 44 U/L 58  45       Lab Results  Component Value Date   CEA1 3.8  09/27/2022   /  CEA  Date Value Ref Range Status  09/27/2022 3.8 0.0 - 4.7 ng/mL Final    Comment:    (NOTE)                             Nonsmokers          <3.9                             Smokers             <5.6 Roche Diagnostics Electrochemiluminescence Immunoassay (ECLIA) Values obtained with different assay methods or kits cannot be used interchangeably.  Results cannot be interpreted as absolute evidence of the presence or absence of malignant disease. Performed At: [facility] [place], [place] [id number] Rush Farmer MD [id number]    No results found for: "PSA1" No results found for: [phone]" No results found for: "CAN125"  No results found for: "TOTALPROTELP", "ALBUMINELP", "A1GS", "A2GS", "BETS", "BETA2SER", "GAMS", "MSPIKE", "SPEI" Lab Results  Component Value Date   TIBC 204 (L) 09/27/2022   IRONPCTSAT 57 (H) 09/27/2022   Lab Results  Component Value Date   LDH 144 10/08/2019   LDH 142 06/21/2019   LDH 140 01/02/2019     STUDIES:   US ABDOMEN RUQ W/ELASTOGRAPHY  Result Date: 10/01/2022 CLINICAL DATA:  Elevated LFTs question cirrhosis EXAM: US ABDOMEN LIMITED - RIGHT UPPER QUADRANT ULTRASOUND HEPATIC ELASTOGRAPHY TECHNIQUE: Sonography of the right upper quadrant was performed. In addition, ultrasound elastography evaluation of the liver was performed. A region of interest was placed within the right lobe of the liver. Following application of a compressive sonographic pulse, tissue compressibility was assessed. Multiple assessments were performed at the selected site. Median tissue compressibility was determined. Previously, hepatic stiffness was assessed by shear wave velocity. Based on recently published Society  of Radiologists in Ultrasound consensus article, reporting is now recommended to be performed in the SI units of pressure (kiloPascals) representing hepatic stiffness/elasticity. The obtained result is compared to the published reference standards. (cACLD = compensated Advanced Chronic Liver Disease) COMPARISON:  CT abdomen and pelvis 03/23/2022 FINDINGS: ULTRASOUND ABDOMEN LIMITED RIGHT UPPER QUADRANT Gallbladder: Surgically absent Common bile duct: Diameter: 4 mm, normal Liver: Heterogeneous hepatic echogenicity. Questionably slightly nodular contour. No focal hepatic mass. Portal vein is patent on color Doppler imaging with normal direction of blood flow towards the liver. ULTRASOUND HEPATIC ELASTOGRAPHY Device: Siemens Helix VTQ Patient position: Oblique Transducer 9C2 Number of measurements: 10 Hepatic segment:  8 Median kPa: 7.2 IQR: 3.5 IQR/Median kPa ratio: 0.49 Data quality:  Good Diagnostic category: < or = 9 kPa: in the absence of other known clinical signs, rules out cACLD The use of hepatic elastography is applicable to patients with viral hepatitis and non-alcoholic fatty liver disease. At this time, there is insufficient data for the referenced cut-off values and use in other causes of liver disease, including alcoholic liver disease. Patients, however, may be assessed by elastography and serve as their own reference standard/baseline. In patients with non-alcoholic liver disease, the values suggesting compensated advanced chronic liver disease (cACLD) may be lower, and patients may need additional testing with elasticity results of 7-9 kPa. Please note that abnormal hepatic elasticity and shear wave velocities may also be identified in clinical settings other than with hepatic fibrosis, such as: acute hepatitis, elevated right heart and central venous pressures including use of beta  blockers, veno-occlusive disease (Budd-Chiari), infiltrative processes such as mastocytosis/amyloidosis/infiltrative  tumor/lymphoma, extrahepatic cholestasis, with hyperemia in the post-prandial state, and with liver transplantation. Correlation with patient history, laboratory data, and clinical condition recommended. Diagnostic Categories: < or =5 kPa: high probability of being normal < or =9 kPa: in the absence of other known clinical signs, rules out cACLD >9 kPa and ?13 kPa: suggestive of cACLD, but needs further testing >13 kPa: highly suggestive of cACLD > or =17 kPa: highly suggestive of cACLD with an increased probability of clinically significant portal hypertension IMPRESSION: ULTRASOUND RUQ: Increased hepatic echogenicity with questionably slightly increased hepatic contour. Post cholecystectomy. ULTRASOUND HEPATIC ELASTOGRAPHY: Median kPa:  7.2 Diagnostic category: < or = 9 kPa: in the absence of other known clinical signs, rules out cACLD; please note however that in patients with non-alcoholic liver disease, the values suggesting compensated advanced chronic liver disease (cACLD) may be lower, and patients may need additional testing with elasticity results of 7-9 kPa. Electronically Signed   By: Lavonia Dana M.D.   On: 10/01/2022 11:18

## 2022-10-04 NOTE — Patient Instructions (Addendum)
[place] at [facility] Discharge Instructions   You were seen and examined today by Dr. Delton Coombes.  He reviewed the results of your lab work which are normal/stable. Your cancer tumor marker (CEA) is normal. Your Vitamin D is still low despite taking the 50,000 units weekly. Continue to take the weekly vitamin D, but add over the counter vitamin D 2000 units daily.   We will see you back in 6 months. We will repeat lab work and a scan prior to your next visit.     Thank you for choosing [facility] to provide your oncology and hematology care.  To afford each patient quality time with our provider, please arrive at least 15 minutes before your scheduled appointment time.   If you have a lab appointment with the [facility] please come in thru the Main Entrance and check in at the main information desk.  You need to re-schedule your appointment should you arrive 10 or more minutes late.  We strive to give you quality time with our providers, and arriving late affects you and other patients whose appointments are after yours.  Also, if you no show three or more times for appointments you may be dismissed from the clinic at the providers discretion.     Again, thank you for choosing [facility].  Our hope is that these requests will decrease the amount of time that you wait before being seen by our physicians.       _____________________________________________________________  Should you have questions after your visit to [facility], please contact our office at [phone] and follow the prompts.  Our office hours are 8:00 a.m. and 4:30 p.m. Monday - Friday.  Please note that voicemails left after 4:00 p.m. may not be returned until the following business day.  We are closed weekends and major holidays.  You do have access to a nurse 24-7, just call the main number to the clinic [phone] and do not  press any options, hold on the line and a nurse will answer the phone.    For prescription refill requests, have your pharmacy contact our office and allow 72 hours.    Due to Covid, you will need to wear a mask upon entering the hospital. If you do not have a mask, a mask will be given to you at the Main Entrance upon arrival. For doctor visits, patients may have 1 support person age 35 or older with them. For treatment visits, patients can not have anyone with them due to social distancing guidelines and our immunocompromised population.

## 2022-10-18 ENCOUNTER — Other Ambulatory Visit: Payer: Self-pay | Admitting: Physician Assistant

## 2022-10-18 DIAGNOSIS — R17 Unspecified jaundice: Secondary | ICD-10-CM

## 2022-10-19 ENCOUNTER — Other Ambulatory Visit: Payer: Self-pay | Admitting: *Deleted

## 2022-10-19 ENCOUNTER — Ambulatory Visit (HOSPITAL_COMMUNITY): Payer: Medicare Other

## 2022-10-19 DIAGNOSIS — E559 Vitamin D deficiency, unspecified: Secondary | ICD-10-CM

## 2022-10-19 MED ORDER — VITAMIN D (ERGOCALCIFEROL) 1.25 MG (50000 UNIT) PO CAPS: 50000.0000 [IU] | ORAL_CAPSULE | ORAL | 0 refills | Status: DC

## 2022-11-01 ENCOUNTER — Other Ambulatory Visit: Payer: Self-pay | Admitting: Student

## 2022-11-01 DIAGNOSIS — R7989 Other specified abnormal findings of blood chemistry: Secondary | ICD-10-CM

## 2022-11-02 ENCOUNTER — Ambulatory Visit (HOSPITAL_COMMUNITY)
Admission: RE | Admit: 2022-11-02 | Discharge: 2022-11-02 | Disposition: A | Discharge status: Home / Self Care | Payer: Medicare Other | Source: Ambulatory Visit | Attending: Gastroenterology | Admitting: Gastroenterology

## 2022-11-02 ENCOUNTER — Ambulatory Visit (HOSPITAL_COMMUNITY)
Admission: RE | Admit: 2022-11-02 | Discharge: 2022-11-02 | Disposition: A | Discharge status: Home / Self Care | Payer: Medicare Other | Source: Ambulatory Visit | Attending: Interventional Radiology | Admitting: Interventional Radiology

## 2022-11-02 ENCOUNTER — Ambulatory Visit (HOSPITAL_COMMUNITY): Admission: RE | Admit: 2022-11-02 | Payer: Medicare Other | Source: Ambulatory Visit

## 2022-11-02 DIAGNOSIS — K739 Chronic hepatitis, unspecified: Secondary | ICD-10-CM | POA: Insufficient documentation

## 2022-11-02 DIAGNOSIS — R918 Other nonspecific abnormal finding of lung field: Secondary | ICD-10-CM | POA: Diagnosis not present

## 2022-11-02 DIAGNOSIS — R945 Abnormal results of liver function studies: Secondary | ICD-10-CM | POA: Diagnosis not present

## 2022-11-02 DIAGNOSIS — R748 Abnormal levels of other serum enzymes: Secondary | ICD-10-CM | POA: Insufficient documentation

## 2022-11-02 DIAGNOSIS — K7460 Unspecified cirrhosis of liver: Secondary | ICD-10-CM | POA: Insufficient documentation

## 2022-11-02 DIAGNOSIS — R7989 Other specified abnormal findings of blood chemistry: Secondary | ICD-10-CM

## 2022-11-02 DIAGNOSIS — N186 End stage renal disease: Secondary | ICD-10-CM | POA: Diagnosis not present

## 2022-11-02 LAB — GLUCOSE, CAPILLARY: Glucose-Capillary: 109 mg/dL — ABNORMAL HIGH (ref 70–99)

## 2022-11-02 LAB — CBC
HCT: 37.1 % — ABNORMAL LOW (ref 39.0–52.0)
Hemoglobin: 12.8 g/dL — ABNORMAL LOW (ref 13.0–17.0)
MCH: 35.4 pg — ABNORMAL HIGH (ref 26.0–34.0)
MCHC: 34.5 g/dL (ref 30.0–36.0)
MCV: 102.5 fL — ABNORMAL HIGH (ref 80.0–100.0)
Platelets: 67 10*3/uL — ABNORMAL LOW (ref 150–400)
RBC: 3.62 MIL/uL — ABNORMAL LOW (ref 4.22–5.81)
RDW: 14 % (ref 11.5–15.5)
WBC: 4.2 10*3/uL (ref 4.0–10.5)
nRBC: 0 % (ref 0.0–0.2)

## 2022-11-02 LAB — PROTIME-INR
INR: 1.8 — ABNORMAL HIGH (ref 0.8–1.2)
Prothrombin Time: 20.9 seconds — ABNORMAL HIGH (ref 11.4–15.2)

## 2022-11-02 MED ORDER — SODIUM CHLORIDE 0.9 % IV SOLN: INTRAVENOUS | Status: DC

## 2022-11-02 MED ORDER — MIDAZOLAM HCL 2 MG/2ML IJ SOLN
INTRAMUSCULAR | Status: AC
  Filled 2022-11-02: qty 2

## 2022-11-02 MED ORDER — FENTANYL CITRATE (PF) 100 MCG/2ML IJ SOLN
INTRAMUSCULAR | Status: AC | PRN
  Administered 2022-11-02: 50 ug via INTRAVENOUS

## 2022-11-02 MED ORDER — LIDOCAINE HCL (PF) 1 % IJ SOLN
8.0000 mL | Freq: Once | INTRAMUSCULAR | Status: AC
  Administered 2022-11-02: 8 mL via INTRADERMAL

## 2022-11-02 MED ORDER — HYDROCODONE-ACETAMINOPHEN 5-325 MG PO TABS: 1.0000 | ORAL_TABLET | ORAL | Status: DC | PRN

## 2022-11-02 MED ORDER — MIDAZOLAM HCL 2 MG/2ML IJ SOLN
INTRAMUSCULAR | Status: AC | PRN
  Administered 2022-11-02: 1 mg via INTRAVENOUS

## 2022-11-02 MED ORDER — FENTANYL CITRATE (PF) 100 MCG/2ML IJ SOLN
INTRAMUSCULAR | Status: AC
  Filled 2022-11-02: qty 2

## 2022-11-02 NOTE — Procedures (Signed)
  Procedure:  [place] core liver biopsy R Preprocedure diagnosis: The encounter diagnosis was Elevated LFTs. Postprocedure diagnosis: same EBL:    minimal Complications:   none immediate  See full dictation in [facility].  Dillard Cannon MD Main # [phone] Pager  [phone] Mobile [phone]

## 2022-11-02 NOTE — H&P (Addendum)
Chief Complaint: Patient was seen in consultation today for abnormal LFTs at the request of Harvel Quale  Referring Physician(s): Harvel Quale  Supervising Physician: Sandi Mariscal  Patient Status: [facility] - Out-pt  History of Present Illness: Jose Lawrence is a 82 y.o. male followed by gastroenterology for elevated liver enzymes. Pt has experienced slow increase in liver enzymes over the last couple of years. He has history of colon cancer for which he received chemotherapy in 2020, currently in remission. Pt has been referred to IR for random liver biopsy by Dr. Jenetta Downer to rule out autoimmune hepatitis.   Pt denies fever, chills, CP, SOB, N/V abd pain or weakness.  He endorses fatigue. Pt is NPO per order He confirms Afib with no AC/AP  Past Medical History:  Diagnosis Date   Anemia    Causing interruption in anticoagulation   Atrial fibrillation and flutter (HCC)    BPH (benign prostatic hypertrophy)    Colon cancer ([place])    Status post LAR 2008   Hypothyroidism    Lacunar infarction ([place]) 11/2012   RT 3rd NERVE PALSY, SB [name]   PE (pulmonary embolism)    Temporarily on Eliquis 2017   Skin cancer    35 years ago   Type 2 diabetes mellitus ([place])    Vitamin B 12 deficiency 07/10/2019    Past Surgical History:  Procedure Laterality Date   ABDOMINOPERINEAL PROCTOCOLECTOMY  2009   end colostomy    BIOPSY  08/25/2018   Procedure: BIOPSY;  Surgeon: Rogene Houston, MD;  Location: [facility];  Service: Endoscopy;;   BIOPSY  10/04/2021   Procedure: BIOPSY;  Surgeon: Rogene Houston, MD;  Location: [facility];  Service: Endoscopy;;  small, bowel   BLADDER REPAIR  08/29/2018   Procedure: BLADDER REPAIR;  Surgeon: [name], MD;  Location: [facility] ORS;  Service: General;;   COLECTOMY  2008   COLONOSCOPY WITH PROPOFOL N/A 08/25/2018   Procedure: COLONOSCOPY WITH PROPOFOL;  Surgeon: Rogene Houston, MD;  Location: [facility];   Service: Endoscopy;  Laterality: N/A;  2:20   COLOSTOMY     EXPLORATORY LAPAROTOMY  2017   ? pneumoperitoneum of unknown etiology/ negative Ex lap   GIVENS CAPSULE STUDY  10/04/2021   Procedure: GIVENS CAPSULE STUDY;  Surgeon: Rogene Houston, MD;  [facility];  Service: Endoscopy;;   ILEOSCOPY N/A 10/04/2021   Procedure: ILEOSCOPY THROUGH STOMA;  Surgeon: Rogene Houston, MD;  [facility];  Service: Endoscopy;  Laterality: N/A;   ILEOSTOMY  08/29/2018   Procedure: ILEOSTOMY;  Surgeon: [name], MD;  Location: [facility];  Service: General;;   LAPAROSCOPIC CHOLECYSTECTOMY  2007   LYSIS OF ADHESION  08/29/2018   Procedure: LYSIS OF ADHESION;  Surgeon: [name], MD;  Location: [facility];  Service: General;;   PARTIAL COLECTOMY N/A 08/29/2018   Procedure: PARTIAL COLECTOMY;  Surgeon: [name], MD;  Location: [facility];  Service: General;  Laterality: N/A;   PORTACATH PLACEMENT Left 10/04/2018   Procedure: INSERTION PORT-A-CATH (attached catheter left subclavian);  Surgeon: [name], MD;  Location: [facility];  Service: General;  Laterality: Left;   SBO SURGERY  06/2009    Allergies: Patient has no known allergies.  Medications: Prior to Admission medications   Medication Sig Start Date End Date Taking? Authorizing Provider  ferrous sulfate (FERROUSUL) 325 (65 FE) MG tablet Take 1 tablet (325 mg total) by mouth 2 (  two) times daily with a meal. 10/07/21   Emokpae, Courage, MD  glipiZIDE (GLUCOTROL) 10 MG tablet Take 10 mg by mouth 2 (two) times daily. 10 mg in am and 5 mg QHS 07/05/18   [provider]  lactose free nutrition (BOOST) LIQD Take 237 mLs by mouth daily at 6 (six) AM.    [provider]  levothyroxine (SYNTHROID, LEVOTHROID) 50 MCG tablet Take 50 mcg by mouth daily before breakfast.    [provider]  loperamide (IMODIUM) 2 MG capsule Take 2 capsules (4 mg total) by mouth 4 (four) times daily -  before meals  and at bedtime. 11/11/18   [name], MD  MAGNESIUM GLYCINATE PO Take 240 mg by mouth daily at 6 (six) AM.    [provider]  metoprolol tartrate (LOPRESSOR) 25 MG tablet Take 0.5 tablets (12.5 mg total) by mouth 2 (two) times daily. Hold if Heart Rate < 70 bpm Patient not taking: Reported on 10/04/2022 10/04/21   Roxan Hockey, MD  [facility] VERIO test strip 1 each 2 (two) times daily. 01/21/22   [provider]  tamsulosin (FLOMAX) 0.4 MG CAPS capsule Take 1 capsule (0.4 mg total) by mouth daily after supper. 10/04/21   Roxan Hockey, MD  Vitamin D, Ergocalciferol, (DRISDOL) 1.25 MG (50000 UNIT) CAPS capsule Take 1 capsule (50,000 Units total) by mouth once a week. 10/19/22   Derek Jack, MD     Family History  Problem Relation Age of Onset   Heart disease Father    Heart failure Father    Heart attack Father    Prostate cancer Paternal Uncle     Social History   Socioeconomic History   Marital status: Married    Spouse name: Not on file   Number of children: Not on file   Years of education: Not on file   Highest education level: Not on file  Occupational History   Occupation: Architect    Comment: [employer]  Tobacco Use   Smoking status: Former    Packs/day: 1.00    Years: 27.00    Additional pack years: 0.00    Total pack years: 27.00    Types: Cigarettes    Start date: 01/27/1964    Quit date: 01/27/1991    Years since quitting: 31.7   Smokeless tobacco: Former    Types: Chew    Quit date: 08/10/1995  Vaping Use   Vaping Use: Never used  Substance and Sexual Activity   Alcohol use: Never    Alcohol/week: 0.0 standard drinks of alcohol   Drug use: Never   Sexual activity: Not Currently  Other Topics Concern   Not on file  Social History Narrative   Not on file   Social Determinants of Health   Financial Resource Strain: Low Risk  (09/01/2020)   Overall Financial Resource Strain (CARDIA)    Difficulty of Paying Living  Expenses: Not hard at all  Food Insecurity: No [place] (09/01/2020)   Hunger Vital Sign    Worried About Running Out of Food in the Last Year: Never true    [place] in the Last Year: Never true  Transportation Needs: No Transportation Needs (09/01/2020)   PRAPARE - Hydrologist (Medical): No    Lack of Transportation (Non-Medical): No  Physical Activity: Sufficiently Active (09/01/2020)   Exercise Vital Sign    Days of Exercise per Week: 7 days    Minutes of Exercise per Session:  30 min  Stress: No Stress Concern Present (09/01/2020)   [place]    Feeling of Stress : Not at all  Social Connections: [place] (09/01/2020)   Social Connection and Isolation Panel [NHANES]    Frequency of Communication with Friends and Family: More than three times a week    Frequency of Social Gatherings with Friends and Family: More than three times a week    Attends Religious Services: More than 4 times per year    Active Member of [employer] or Organizations: Yes    Attends Music therapist: More than 4 times per year    Marital Status: Married     Review of Systems: A 12 point ROS discussed and pertinent positives are indicated in the HPI above.  All other systems are negative.  Review of Systems  Constitutional:  Positive for fatigue. Negative for appetite change, chills and fever.  Respiratory:  Negative for shortness of breath.   Cardiovascular:  Negative for chest pain.  Gastrointestinal:  Negative for abdominal pain, nausea and vomiting.  Neurological:  Negative for dizziness, weakness, light-headedness and headaches.    Vital Signs: BP 134/67   Pulse 65   Temp 98.2 F (36.8 C) (Oral)   Resp 16   Ht 6' (1.829 m)   Wt 175 lb (79.4 kg)   SpO2 100%   BMI 23.73 kg/m     Physical Exam Vitals reviewed.  Constitutional:      General: He is not in acute  distress.    Appearance: Normal appearance. He is not ill-appearing.  HENT:     Head: Normocephalic and atraumatic.     Mouth/Throat:     Mouth: Mucous membranes are dry.     Pharynx: Oropharynx is clear.  Eyes:     Extraocular Movements: Extraocular movements intact.     Pupils: Pupils are equal, round, and reactive to light.  Cardiovascular:     Rate and Rhythm: Normal rate. Rhythm irregular.     Pulses: Normal pulses.     Heart sounds: Normal heart sounds.  Pulmonary:     Effort: Pulmonary effort is normal.     Breath sounds: Normal breath sounds.  Abdominal:     General: Bowel sounds are normal. There is no distension.     Palpations: Abdomen is soft.     Tenderness: There is abdominal tenderness. There is no guarding.  Musculoskeletal:     Right lower leg: No edema.     Left lower leg: No edema.  Skin:    General: Skin is warm and dry.  Neurological:     Mental Status: He is alert and oriented to person, place, and time.  Psychiatric:        Mood and Affect: Mood normal.        Behavior: Behavior normal.        Thought Content: Thought content normal.        Judgment: Judgment normal.     Imaging: No results found.  Labs:  CBC: Recent Labs    03/23/22 0856 09/27/22 1136  WBC 5.9 4.7  HGB 15.1 13.0  HCT 44.5 38.9*  PLT 90* 76*    COAGS: Recent Labs    09/27/22 1136  INR 1.7*    BMP: Recent Labs    03/23/22 0856 09/27/22 1136  NA 137 136  K 4.3 4.6  CL 107 105  CO2 25 27  GLUCOSE 108* 178*  BUN 20  14  CALCIUM 9.5 8.7*  CREATININE 1.31* 1.14  GFRNONAA 55* >60    LIVER FUNCTION TESTS: Recent Labs    03/23/22 0856 09/27/22 1136  BILITOT 1.8* 3.1*  AST 56* 90*  ALT 45* 58*  ALKPHOS 189* 162*  PROT 7.9 7.0  ALBUMIN 3.7 3.1*    TUMOR MARKERS: No results for input(s): "AFPTM", "CEA", "CA199", "CHROMGRNA" in the last 8760 hours.  Assessment and Plan:  82 yo male with PMHx significant for anemia, Afib and flutter, colon cancer,  PE, skin cancer and DM II presents to IR for random liver biopsy.   Pt resting on stretcher.  He is A&O, calm and pleasant.  He is in no distress.  Today's lNR 1.8, Plts 67  Risks and benefits of random liver biopsy with moderate sedation was discussed with the patient and/or patient's family including, but not limited to bleeding, infection, damage to adjacent structures or low yield requiring additional tests.  All of the questions were answered and there is agreement to proceed.  Consent signed and in chart.  Thank you for this interesting consult.  I greatly enjoyed meeting Jose Lawrence and look forward to participating in their care.  A copy of this report was sent to the requesting provider on this date.  Electronically Signed: Tyson Alias, NP 11/02/2022, 1:18 PM   I spent a total of 20 minutes in face to face in clinical consultation, greater than 50% of which was counseling/coordinating care for abnormal LFTs.

## 2022-11-03 LAB — SURGICAL PATHOLOGY

## 2022-11-04 DIAGNOSIS — D696 Thrombocytopenia, unspecified: Secondary | ICD-10-CM | POA: Diagnosis not present

## 2022-11-04 DIAGNOSIS — E1165 Type 2 diabetes mellitus with hyperglycemia: Secondary | ICD-10-CM | POA: Diagnosis not present

## 2022-11-04 DIAGNOSIS — I4891 Unspecified atrial fibrillation: Secondary | ICD-10-CM | POA: Diagnosis not present

## 2022-11-04 DIAGNOSIS — Z299 Encounter for prophylactic measures, unspecified: Secondary | ICD-10-CM | POA: Diagnosis not present

## 2022-11-04 DIAGNOSIS — I10 Essential (primary) hypertension: Secondary | ICD-10-CM | POA: Diagnosis not present

## 2022-11-08 ENCOUNTER — Other Ambulatory Visit (INDEPENDENT_AMBULATORY_CARE_PROVIDER_SITE_OTHER): Payer: Self-pay | Admitting: Gastroenterology

## 2022-11-08 ENCOUNTER — Telehealth (INDEPENDENT_AMBULATORY_CARE_PROVIDER_SITE_OTHER): Payer: Self-pay | Admitting: Gastroenterology

## 2022-11-08 DIAGNOSIS — K754 Autoimmune hepatitis: Secondary | ICD-10-CM

## 2022-11-08 NOTE — Telephone Encounter (Signed)
Received the results of the most recent liver biopsy samples which showed presence of cirrhotic changes.  There were some inflammatory changes without overt autoimmune hepatitis changes.  This could be related to drug-induced liver injury given his history of chemotherapy in the past.  However, it is still possible that this could be related to AIH.  Due to this, we will check TPMT levels and if adequate we will use a combination of Imuran and prednisone.  If responsive, we we will assume he has autoimmune hepatitis but if not we will stop treatment.  Hi Mitzie,  Can you please schedule a follow up appointment for this patient in 3 months with me or any of the APPs?  Thanks,  Maylon Peppers, MD Gastroenterology and Hepatology [facility] Gastroenterology

## 2022-11-16 DIAGNOSIS — L570 Actinic keratosis: Secondary | ICD-10-CM | POA: Diagnosis not present

## 2022-11-16 DIAGNOSIS — Z85828 Personal history of other malignant neoplasm of skin: Secondary | ICD-10-CM | POA: Diagnosis not present

## 2022-11-16 DIAGNOSIS — X32XXXD Exposure to sunlight, subsequent encounter: Secondary | ICD-10-CM | POA: Diagnosis not present

## 2022-11-16 DIAGNOSIS — L820 Inflamed seborrheic keratosis: Secondary | ICD-10-CM | POA: Diagnosis not present

## 2022-11-16 DIAGNOSIS — Z08 Encounter for follow-up examination after completed treatment for malignant neoplasm: Secondary | ICD-10-CM | POA: Diagnosis not present

## 2022-11-22 ENCOUNTER — Encounter (INDEPENDENT_AMBULATORY_CARE_PROVIDER_SITE_OTHER): Payer: Self-pay | Admitting: Gastroenterology

## 2022-12-15 ENCOUNTER — Encounter (INDEPENDENT_AMBULATORY_CARE_PROVIDER_SITE_OTHER): Payer: Self-pay | Admitting: Gastroenterology

## 2023-01-10 DIAGNOSIS — E113293 Type 2 diabetes mellitus with mild nonproliferative diabetic retinopathy without macular edema, bilateral: Secondary | ICD-10-CM | POA: Diagnosis not present

## 2023-01-10 DIAGNOSIS — H52223 Regular astigmatism, bilateral: Secondary | ICD-10-CM | POA: Diagnosis not present

## 2023-01-10 DIAGNOSIS — H524 Presbyopia: Secondary | ICD-10-CM | POA: Diagnosis not present

## 2023-01-10 DIAGNOSIS — H25813 Combined forms of age-related cataract, bilateral: Secondary | ICD-10-CM | POA: Diagnosis not present

## 2023-01-10 DIAGNOSIS — H5203 Hypermetropia, bilateral: Secondary | ICD-10-CM | POA: Diagnosis not present

## 2023-01-10 DIAGNOSIS — H43813 Vitreous degeneration, bilateral: Secondary | ICD-10-CM | POA: Diagnosis not present

## 2023-01-10 DIAGNOSIS — H04123 Dry eye syndrome of bilateral lacrimal glands: Secondary | ICD-10-CM | POA: Diagnosis not present

## 2023-01-26 DIAGNOSIS — H2513 Age-related nuclear cataract, bilateral: Secondary | ICD-10-CM | POA: Diagnosis not present

## 2023-01-26 DIAGNOSIS — H2511 Age-related nuclear cataract, right eye: Secondary | ICD-10-CM | POA: Diagnosis not present

## 2023-02-01 ENCOUNTER — Other Ambulatory Visit: Payer: Self-pay | Admitting: Hematology

## 2023-02-01 DIAGNOSIS — E559 Vitamin D deficiency, unspecified: Secondary | ICD-10-CM

## 2023-02-08 DIAGNOSIS — H2511 Age-related nuclear cataract, right eye: Secondary | ICD-10-CM | POA: Diagnosis not present

## 2023-02-11 DIAGNOSIS — Z299 Encounter for prophylactic measures, unspecified: Secondary | ICD-10-CM | POA: Diagnosis not present

## 2023-02-11 DIAGNOSIS — I152 Hypertension secondary to endocrine disorders: Secondary | ICD-10-CM | POA: Diagnosis not present

## 2023-02-11 DIAGNOSIS — R600 Localized edema: Secondary | ICD-10-CM | POA: Diagnosis not present

## 2023-02-11 DIAGNOSIS — E1159 Type 2 diabetes mellitus with other circulatory complications: Secondary | ICD-10-CM | POA: Diagnosis not present

## 2023-02-11 DIAGNOSIS — I10 Essential (primary) hypertension: Secondary | ICD-10-CM | POA: Diagnosis not present

## 2023-02-28 DIAGNOSIS — R600 Localized edema: Secondary | ICD-10-CM | POA: Diagnosis not present

## 2023-03-07 ENCOUNTER — Ambulatory Visit (INDEPENDENT_AMBULATORY_CARE_PROVIDER_SITE_OTHER): Payer: Medicare Other | Admitting: Gastroenterology

## 2023-03-08 DIAGNOSIS — H2512 Age-related nuclear cataract, left eye: Secondary | ICD-10-CM | POA: Diagnosis not present

## 2023-03-11 DIAGNOSIS — Z299 Encounter for prophylactic measures, unspecified: Secondary | ICD-10-CM | POA: Diagnosis not present

## 2023-03-11 DIAGNOSIS — K738 Other chronic hepatitis, not elsewhere classified: Secondary | ICD-10-CM | POA: Diagnosis not present

## 2023-03-11 DIAGNOSIS — R601 Generalized edema: Secondary | ICD-10-CM | POA: Diagnosis not present

## 2023-03-11 DIAGNOSIS — I10 Essential (primary) hypertension: Secondary | ICD-10-CM | POA: Diagnosis not present

## 2023-03-11 DIAGNOSIS — Z79899 Other long term (current) drug therapy: Secondary | ICD-10-CM | POA: Diagnosis not present

## 2023-03-11 DIAGNOSIS — R5383 Other fatigue: Secondary | ICD-10-CM | POA: Diagnosis not present

## 2023-03-11 DIAGNOSIS — I272 Pulmonary hypertension, unspecified: Secondary | ICD-10-CM | POA: Diagnosis not present

## 2023-03-14 ENCOUNTER — Ambulatory Visit (INDEPENDENT_AMBULATORY_CARE_PROVIDER_SITE_OTHER): Payer: Medicare Other | Admitting: Gastroenterology

## 2023-03-21 ENCOUNTER — Ambulatory Visit (INDEPENDENT_AMBULATORY_CARE_PROVIDER_SITE_OTHER): Payer: Medicare Other | Admitting: Gastroenterology

## 2023-03-25 DIAGNOSIS — R600 Localized edema: Secondary | ICD-10-CM | POA: Diagnosis not present

## 2023-03-25 DIAGNOSIS — I10 Essential (primary) hypertension: Secondary | ICD-10-CM | POA: Diagnosis not present

## 2023-03-25 DIAGNOSIS — Z299 Encounter for prophylactic measures, unspecified: Secondary | ICD-10-CM | POA: Diagnosis not present

## 2023-03-29 DIAGNOSIS — R112 Nausea with vomiting, unspecified: Secondary | ICD-10-CM | POA: Diagnosis not present

## 2023-04-04 ENCOUNTER — Ambulatory Visit (HOSPITAL_COMMUNITY)
Admission: RE | Admit: 2023-04-04 | Discharge: 2023-04-04 | Disposition: A | Discharge status: Home / Self Care | Payer: Medicare Other | Source: Ambulatory Visit | Attending: Hematology | Admitting: Hematology

## 2023-04-04 ENCOUNTER — Inpatient Hospital Stay: Payer: Medicare Other | Attending: Hematology

## 2023-04-04 DIAGNOSIS — C184 Malignant neoplasm of transverse colon: Secondary | ICD-10-CM | POA: Diagnosis not present

## 2023-04-04 DIAGNOSIS — E538 Deficiency of other specified B group vitamins: Secondary | ICD-10-CM | POA: Insufficient documentation

## 2023-04-04 DIAGNOSIS — Z933 Colostomy status: Secondary | ICD-10-CM | POA: Diagnosis not present

## 2023-04-04 DIAGNOSIS — Z87891 Personal history of nicotine dependence: Secondary | ICD-10-CM | POA: Diagnosis not present

## 2023-04-04 DIAGNOSIS — E559 Vitamin D deficiency, unspecified: Secondary | ICD-10-CM | POA: Insufficient documentation

## 2023-04-04 DIAGNOSIS — D696 Thrombocytopenia, unspecified: Secondary | ICD-10-CM | POA: Insufficient documentation

## 2023-04-04 DIAGNOSIS — Z85038 Personal history of other malignant neoplasm of large intestine: Secondary | ICD-10-CM | POA: Insufficient documentation

## 2023-04-04 LAB — CBC WITH DIFFERENTIAL/PLATELET
Abs Immature Granulocytes: 0.04 10*3/uL (ref 0.00–0.07)
Basophils Absolute: 0.1 10*3/uL (ref 0.0–0.1)
Basophils Relative: 1 %
Eosinophils Absolute: 0.1 10*3/uL (ref 0.0–0.5)
Eosinophils Relative: 2 %
HCT: 32.9 % — ABNORMAL LOW (ref 39.0–52.0)
Hemoglobin: 11.2 g/dL — ABNORMAL LOW (ref 13.0–17.0)
Immature Granulocytes: 1 %
Lymphocytes Relative: 16 %
Lymphs Abs: 0.8 10*3/uL (ref 0.7–4.0)
MCH: 36.8 pg — ABNORMAL HIGH (ref 26.0–34.0)
MCHC: 34 g/dL (ref 30.0–36.0)
MCV: 108.2 fL — ABNORMAL HIGH (ref 80.0–100.0)
Monocytes Absolute: 0.5 10*3/uL (ref 0.1–1.0)
Monocytes Relative: 9 %
Neutro Abs: 3.6 10*3/uL (ref 1.7–7.7)
Neutrophils Relative %: 71 %
Platelets: 77 10*3/uL — ABNORMAL LOW (ref 150–400)
RBC: 3.04 MIL/uL — ABNORMAL LOW (ref 4.22–5.81)
RDW: 14.7 % (ref 11.5–15.5)
WBC: 5.1 10*3/uL (ref 4.0–10.5)
nRBC: 0 % (ref 0.0–0.2)

## 2023-04-04 LAB — COMPREHENSIVE METABOLIC PANEL
ALT: 38 U/L (ref 0–44)
AST: 59 U/L — ABNORMAL HIGH (ref 15–41)
Albumin: 2.6 g/dL — ABNORMAL LOW (ref 3.5–5.0)
Alkaline Phosphatase: 124 U/L (ref 38–126)
Anion gap: 5 (ref 5–15)
BUN: 16 mg/dL (ref 8–23)
CO2: 24 mmol/L (ref 22–32)
Calcium: 8.3 mg/dL — ABNORMAL LOW (ref 8.9–10.3)
Chloride: 105 mmol/L (ref 98–111)
Creatinine, Ser: 1.11 mg/dL (ref 0.61–1.24)
GFR, Estimated: >60 mL/min (ref >60)
Glucose, Bld: 194 mg/dL — ABNORMAL HIGH (ref 70–99)
Potassium: 4.7 mmol/L (ref 3.5–5.1)
Sodium: 134 mmol/L — ABNORMAL LOW (ref 135–145)
Total Bilirubin: 2.7 mg/dL — ABNORMAL HIGH (ref 0.3–1.2)
Total Protein: 6.6 g/dL (ref 6.5–8.1)

## 2023-04-04 LAB — VITAMIN D 25 HYDROXY (VIT D DEFICIENCY, FRACTURES): Vit D, 25-Hydroxy: 39.7 ng/mL (ref 30–100)

## 2023-04-04 MED ORDER — IOHEXOL 300 MG/ML  SOLN
100.0000 mL | Freq: Once | INTRAMUSCULAR | Status: AC | PRN
  Administered 2023-04-04: 100 mL via INTRAVENOUS

## 2023-04-05 LAB — CEA: CEA: 3.3 ng/mL (ref 0.0–4.7)

## 2023-04-07 ENCOUNTER — Ambulatory Visit (INDEPENDENT_AMBULATORY_CARE_PROVIDER_SITE_OTHER): Payer: Medicare Other | Admitting: Gastroenterology

## 2023-04-07 VITALS — BP 150/71 | HR 79 | Ht 72.0 in | Wt 202.6 lb

## 2023-04-07 DIAGNOSIS — K7460 Unspecified cirrhosis of liver: Secondary | ICD-10-CM | POA: Diagnosis not present

## 2023-04-07 DIAGNOSIS — K754 Autoimmune hepatitis: Secondary | ICD-10-CM

## 2023-04-07 DIAGNOSIS — K739 Chronic hepatitis, unspecified: Secondary | ICD-10-CM | POA: Diagnosis not present

## 2023-04-07 NOTE — Progress Notes (Addendum)
Referring Provider: Ignatius Specking, MD Primary Care Physician:  Jose Specking, MD Primary GI Physician: Dr. Levon Hedger   Chief Complaint  Patient presents with   Elevated Hepatic Enzymes    Follow up on elevated liver enzymes.    HPI:   Jose Lawrence is a 82 y.o. 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 platelets 73,000, albumin 3.4, total bilirubin 2.9, alkaline phosphatase 214, AST 76, ALT 60. at least since 2020 he had constant inversion of his AST to ALT ratio. since August 2020 had a mild higher elevation of his aminotransferases and fluctuation of total bilirubin. alkaline phosphatase only increased around May 2022.    He underwent liver serologies and elastography which showed IgG 1628, Elevated ASMA IgG of 26 Elastography with increased nodular contour of the liver with a K PA of 7.2 with a ratio of 0.49   Liver biopsy in April with cirrhotic changes, inflammatory changes without overt AIH though given IgG and ASMA, there were plans to start on prednisone and AZA (to determine response of LFTs) after TMPT lab though TMPT lab never completed   Most recent CMP on 8/26 with AST 59, ALT 38, Alk phos 124, T bili 2.7, plt count 77k   Present:  He was not aware he was supposed to do TPMT testing. He denies any swelling in his abdomen. He has some swelling in his scrotum which he has seen his PCP for, had upcoming CT scan so PCP was going to evaluate further with this. He is taking furosemide 40mg  3 days then he will wait a few days and take it again. This is for swelling to LEs. Denies episodes of confusion. No previous Upper endoscopy he is aware. Denies  rectal bleeding, melena, dysphagia, odynophagia, nausea, vomiting, diarrhea or constipation.    Previous MELD 3.0 in August was 19  Cirrhosis related questions: Episodes of confusion/disorientation: no  Taking diuretics? Lasix as above  Beta blockers? No  Prior history of variceal banding? No  Prior episodes of SBP? No  Last liver imaging: elastography in march  Alcohol use: none   Last ileoscopy performed on 10/04/2021, found to have congested mucosa in the distal ileum and ileus..  Biopsies only showed mucosal hyperemia. Last capsule endoscopy was performed 10/04/2021, to have multiple petechia at the start of capsule.  Presence of small amount of blood due to small bowel biopsy, also presence of marked edema and erythema at the stoma.   Past Medical History:  Diagnosis Date   Anemia    Causing interruption in anticoagulation   Atrial fibrillation and flutter (HCC)    BPH (benign prostatic hypertrophy)    Colon cancer (HCC)    Status post LAR 2008   Hypothyroidism    Lacunar infarction (HCC) 11/2012   RT 3rd NERVE PALSY, SB Dr. Lita Mains   PE (pulmonary embolism)    Temporarily on Eliquis 2017   Skin cancer    35 years ago   Type 2 diabetes mellitus (HCC)    Vitamin B 12 deficiency 07/10/2019    Past Surgical History:  Procedure Laterality Date   ABDOMINOPERINEAL PROCTOCOLECTOMY  2009   end colostomy  BIOPSY  08/25/2018   Procedure: BIOPSY;  Surgeon: Malissa Hippo, MD;  [facility];  Service: Endoscopy;;   BIOPSY  10/04/2021   Procedure: BIOPSY;  Surgeon: Malissa Hippo, MD;  [facility];  Service: Endoscopy;;  small, bowel   BLADDER REPAIR  08/29/2018   Procedure: BLADDER REPAIR;  Surgeon: Lucretia Roers, MD;  Location: [facility];  Service: General;;   COLECTOMY  2008   COLONOSCOPY WITH PROPOFOL N/A 08/25/2018   Procedure: COLONOSCOPY WITH PROPOFOL;  Surgeon: Malissa Hippo, MD;  [facility]: [facility];  Service: Endoscopy;  Laterality:  N/A;  2:20   COLOSTOMY     EXPLORATORY LAPAROTOMY  2017   ? pneumoperitoneum of unknown etiology/ negative Ex lap   GIVENS CAPSULE STUDY  10/04/2021   Procedure: GIVENS CAPSULE STUDY;  Surgeon: Malissa Hippo, MD;  [facility];  Service: Endoscopy;;   ILEOSCOPY N/A 10/04/2021   Procedure: ILEOSCOPY THROUGH STOMA;  Surgeon: Malissa Hippo, MD;  [facility];  Service: Endoscopy;  Laterality: N/A;   ILEOSTOMY  08/29/2018   Procedure: ILEOSTOMY;  Surgeon: Lucretia Roers, MD;  Location: [facility];  Service: General;;   LAPAROSCOPIC CHOLECYSTECTOMY  2007   LYSIS OF ADHESION  08/29/2018   Procedure: LYSIS OF ADHESION;  Surgeon: Lucretia Roers, MD;  Location: [facility];  Service: General;;   PARTIAL COLECTOMY N/A 08/29/2018   Procedure: PARTIAL COLECTOMY;  Surgeon: Lucretia Roers, MD;  Location: [facility];  Service: General;  Laterality: N/A;   PORTACATH PLACEMENT Left 10/04/2018   Procedure: INSERTION PORT-A-CATH (attached catheter left subclavian);  Surgeon: Lucretia Roers, MD;  Location: [facility];  Service: General;  Laterality: Left;   SBO SURGERY  06/2009    Current Outpatient Medications  Medication Sig Dispense Refill   Ergocalciferol (VITAMIN D2) 50 MCG (2000 UT) TABS Take by mouth. Once daily     lactose free nutrition (BOOST) LIQD Take 237 mLs by mouth daily at 6 (six) AM.     levothyroxine (SYNTHROID, LEVOTHROID) 50 MCG tablet Take 50 mcg by mouth daily before breakfast.     loperamide (IMODIUM) 2 MG capsule Take 2 capsules (4 mg total) by mouth 4 (four) times daily -  before meals and at bedtime. 240 capsule 3   metoprolol tartrate (LOPRESSOR) 25 MG tablet Take 0.5 tablets (12.5 mg total) by mouth 2 (two) times daily. Hold if Heart Rate < 70 bpm 30 tablet 2   ONETOUCH VERIO test strip 1 each 2 (two) times daily.     tamsulosin (FLOMAX) 0.4 MG CAPS capsule Take 1 capsule (0.4 mg total) by mouth daily after supper. 30 capsule 3   Vitamin D, Ergocalciferol,  (DRISDOL) 1.25 MG (50000 UNIT) CAPS capsule Take 1 capsule by mouth once a week 16 capsule 0   ferrous sulfate (FERROUSUL) 325 (65 FE) MG tablet Take 1 tablet (325 mg total) by mouth 2 (two) times daily with a meal. (Patient not taking: Reported on 04/07/2023) 60 tablet 3   glipiZIDE (GLUCOTROL) 10 MG tablet Take 10 mg by mouth 2 (two) times daily. 10 mg in am and 5 mg QHS (Patient not taking: Reported on 04/07/2023)     No current facility-administered medications for this visit.    Allergies as of 04/07/2023   (No Known Allergies)    Family History  Problem Relation Age of Onset   Heart disease Father    Heart failure Father    Heart  attack Father    Prostate cancer Paternal Uncle     Social History   Socioeconomic History   Marital status: Married    Spouse name: Not on file   Number of children: Not on file   Years of education: Not on file   Highest education level: Not on file  Occupational History   Occupation: Holiday representative    Comment: [employer]  Tobacco Use   Smoking status: Former    Current packs/day: 0.00    Average packs/day: 1 pack/day for 27.0 years (27.0 ttl pk-yrs)    Types: Cigarettes    Start date: 01/27/1964    Quit date: 01/27/1991    Years since quitting: 32.2   Smokeless tobacco: Former    Types: Chew    Quit date: 08/10/1995  Vaping Use   Vaping status: Never Used  Substance and Sexual Activity   Alcohol use: Never    Alcohol/week: 0.0 standard drinks of alcohol   Drug use: Never   Sexual activity: Not Currently  Other Topics Concern   Not on file  Social History Narrative   Not on file   Social Determinants of Health   Financial Resource Strain: Low Risk  (09/01/2020)   Overall Financial Resource Strain (CARDIA)    Difficulty of Paying Living Expenses: Not hard at all  Food Insecurity: No Food Insecurity (09/01/2020)   Hunger Vital Sign    Worried About Running Out of Food in the Last Year: Never true    Ran Out of Food in the Last Year:  Never true  Transportation Needs: No Transportation Needs (09/01/2020)   PRAPARE - Administrator, Civil Service (Medical): No    Lack of Transportation (Non-Medical): No  Physical Activity: Sufficiently Active (09/01/2020)   Exercise Vital Sign    Days of Exercise per Week: 7 days    Minutes of Exercise per Session: 30 min  Stress: No Stress Concern Present (09/01/2020)   [employer] of Occupational Health - Occupational Stress Questionnaire    Feeling of Stress : Not at all  Social Connections: Socially Integrated (09/01/2020)   Social Connection and Isolation Panel [NHANES]    Frequency of Communication with Friends and Family: More than three times a week    Frequency of Social Gatherings with Friends and Family: More than three times a week    Attends Religious Services: More than 4 times per year    Active Member of [employer] or Organizations: Yes    Attends Engineer, structural: More than 4 times per year    Marital Status: Married    Review of systems General: negative for malaise, night sweats, fever, chills, weight loss Neck: Negative for lumps, goiter, pain and significant neck swelling Resp: Negative for cough, wheezing, dyspnea at rest CV: Negative for chest pain, leg swelling, palpitations, orthopnea GI: denies melena, hematochezia, nausea, vomiting, diarrhea, constipation, dysphagia, odyonophagia, early satiety or unintentional weight loss.  MSK: Negative for joint pain or swelling, back pain, and muscle pain. Derm: Negative for itching or rash Psych: Denies depression, anxiety, memory loss, confusion. No homicidal or suicidal ideation.  Heme: Negative for prolonged bleeding, bruising easily, and swollen nodes. Endocrine: Negative for cold or heat intolerance, polyuria, polydipsia and goiter. Neuro: negative for tremor, gait imbalance, syncope and seizures. The remainder of the review of systems is noncontributory.  Physical Exam: BP (!) 150/71    Pulse 79   Ht 6' (1.829 m)   Wt 202 lb 9.6 oz (91.9 kg)  BMI 27.48 kg/m  General:   Alert and oriented. No distress noted. Pleasant and cooperative.  Head:  Normocephalic and atraumatic. Eyes:  Conjuctiva clear without scleral icterus. Mouth:  Oral mucosa pink and moist. Good dentition. No lesions. Heart: Normal rate and rhythm, s1 and s2 heart sounds present.  Lungs: Clear lung sounds in all lobes. Respirations equal and unlabored. Abdomen:  +BS, soft, non-tender and non-distended. No rebound or guarding. No HSM or masses noted. Derm: No palmar erythema or jaundice Msk:  Symmetrical without gross deformities. Normal posture. Extremities:  Without edema. Neurologic:  Alert and  oriented x4 Psych:  Alert and cooperative. Normal mood and affect.  Invalid input(s): "6 MONTHS"   ASSESSMENT: CHINONSO MINNIEAR is a 82 y.o. 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 He remains well compensated. No ascites or signs of HE. MELD 3.0 was 19 with INR from earlier this year. He is due for INR and AFP as well as liver imaging to screen for HCC.  There seemed to be some confusion on the patient's end regarding his diagnosis of cirrhosis and what this means. Discussion had with patient regarding possible etiology of cirrhosis to include fatty liver/possibly AIH, as well as implications of disease to include importance of routine labs and imaging every 6 months with increased risk of incidence of HCC, bleeding, especially with increased risk of esophageal varices, retention of ammonia/fluid, importance of avoiding alcohol and NSAIDs. Pt verbalized understanding of this.    His platelet count was 77k. Given plt count <150k, I did discuss the importance of proceeding with EGD for EV evaluation, with the patient and his wife. Indications, risks and benefits of procedure discussed in detail with patient. Patient verbalized understanding  and is in agreement to proceed with EGD.  Concern for AIH: previously elevated ASMA and IgG, liver biopsy did not show overt changes of AIH though given labs, there were plans to start prednisone and AZA to determine if elevated LFTs were from possible AIH, though patient states he was never aware to complete TPMT testing in order to be able to start medications. His most recent labs showed AST 59, ALT 38, Alk phos 124, will discuss further with Dr. Levon Hedger regarding the need for starting prednisone/AZA given LFTs have nearly normalized other than mildly elevated AST.    PLAN:  -INR, AFP  - Liver US  -Schedule EGD for EV screening ASA III  -will discuss continued need for possible AIH treatment with Dr. Levon Hedger given LFTs have improved - Reduce salt intake to <2 g per day - Can take Tylenol max of 2 g per day (650 mg q8h) for pain - Avoid NSAIDs for pain - Avoid eating raw oysters/shellfish - Ensure every night before going to sleep  All questions were answered, patient verbalized understanding and is in agreement with plan as outlined above.    Follow Up: 6 months   Tequila Rottmann L. 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 Possibly this is related to DILI, as initially speculated but hard evidence of AIH is not present and titers of serologies are low. Due to this, no Tx for AIH will be started.  Katrinka Blazing, MD Gastroenterology and Hepatology [facility]

## 2023-04-07 NOTE — Patient Instructions (Addendum)
We will update some labs in regards to your cirrhosis  - we will update imaging of the liver with an ultrasound -we will get you scheduled for upper endoscopy to evaluate for esophageal varices (inflamed vessels in the esophagus that can bleed) -I will discuss treatment for possible autoimmune hepatitis with Dr. Levon Hedger as liver enzymes have improved  - Reduce salt intake to <2 g per day - Can take Tylenol max of 2 g per day (650 mg q8h) for pain - Avoid NSAIDs for pain - Avoid eating raw oysters/shellfish - Ensure protein shake every night before going to sleep  Follow up 6 months   It was a pleasure to meet you today. I want to create trusting relationships with patients and provide genuine, compassionate, and quality care. I truly value your feedback! please be on the lookout for a survey regarding your visit with me today. I appreciate your input about our visit and your time in completing this!    Jose Lawrence L. Jeanmarie Hubert, MSN, APRN, AGNP-C Adult-Gerontology Nurse Practitioner [facility]enter[facility] at [facility]

## 2023-04-08 ENCOUNTER — Encounter (INDEPENDENT_AMBULATORY_CARE_PROVIDER_SITE_OTHER): Payer: Self-pay

## 2023-04-12 ENCOUNTER — Inpatient Hospital Stay: Payer: Medicare Other | Attending: Hematology | Admitting: Hematology

## 2023-04-12 VITALS — BP 147/68 | HR 59 | Temp 98.1°F | Resp 18 | Ht 72.0 in | Wt 199.4 lb

## 2023-04-12 DIAGNOSIS — C184 Malignant neoplasm of transverse colon: Secondary | ICD-10-CM

## 2023-04-12 DIAGNOSIS — E538 Deficiency of other specified B group vitamins: Secondary | ICD-10-CM | POA: Insufficient documentation

## 2023-04-12 DIAGNOSIS — D696 Thrombocytopenia, unspecified: Secondary | ICD-10-CM | POA: Insufficient documentation

## 2023-04-12 DIAGNOSIS — Z933 Colostomy status: Secondary | ICD-10-CM | POA: Insufficient documentation

## 2023-04-12 DIAGNOSIS — R7401 Elevation of levels of liver transaminase levels: Secondary | ICD-10-CM | POA: Insufficient documentation

## 2023-04-12 DIAGNOSIS — E559 Vitamin D deficiency, unspecified: Secondary | ICD-10-CM | POA: Diagnosis not present

## 2023-04-12 DIAGNOSIS — Z85038 Personal history of other malignant neoplasm of large intestine: Secondary | ICD-10-CM | POA: Insufficient documentation

## 2023-04-12 LAB — AFP TUMOR MARKER: AFP-Tumor Marker: 3 ng/mL (ref <6.1)

## 2023-04-12 LAB — PROTIME-INR
INR: 1.6 — ABNORMAL HIGH
Prothrombin Time: 16.7 s — ABNORMAL HIGH (ref 9.0–11.5)

## 2023-04-12 NOTE — Progress Notes (Signed)
[facility] [address], [place] [address]    Clinic Day:  04/12/2023  Referring physician: Ignatius Specking, MD  Patient Care Team: Ignatius Specking, MD as PCP - General (Internal Medicine) Wyline Mood Dorothe Pea, MD as PCP - Cardiology (Cardiology)   ASSESSMENT & PLAN:   Assessment: 1.  Stage IIb (T4N0) transverse colon adenocarcinoma, MSI stable: -History of colon cancer in 2008, status post resection, did not receive any adjuvant chemo. -Recurrence in February 2009, underwent resection with colostomy, followed by 6 months of FOLFOX with radiation. -12 cycles of adjuvant chemotherapy with infusional 5-FU/leucovorin from 10/09/2018-03/21/2019. -CTAP on 10/08/2019 did not show any evidence of recurrence. -CTAP on 04/21/2020 shows unchanged postoperative appearance of the low pelvis and presacral soft tissues with no evidence of recurrence or metastatic disease.    Plan: 1.  Stage IIb (T4N0) transverse colon adenocarcinoma, MSI stable: - Denies any change in bowel habits.  Stool consistency in the ostomy bag is stable.  No blood in the bag. - Labs show mildly elevated AST of 59 and bilirubin 2.7.  CEA is 3.3. - CTAP on 04/04/2023: No evidence of metastatic disease.  Cirrhosis.  Possible small enhancing bladder lesion on the left. - Will make urology referral for cystoscopy. - RTC 6 months for follow-up with repeat labs including tumor marker and anemia labs.   2.  B12 Deficiency: - He is not taking B12 supplements.  Will check B12 at next visit.   3.  Vitamin D deficiency: - Continue weekly vitamin D and vitamin D 2000 units daily. - Vitamin D level is normal at 39.   4.  Moderate thrombocytopenia: - Likely sequestration from splenomegaly.  Platelet count is stable at 77.  Early MDS is also in the differential.    Orders Placed This Encounter  Procedures   CEA    Standing Status:   Future    Standing Expiration Date:   04/11/2024   CBC with Differential/Platelet     Standing Status:   Future    Standing Expiration Date:   04/11/2024    Order Specific Question:   Release to patient    Answer:   Immediate   Comprehensive metabolic panel    Standing Status:   Future    Standing Expiration Date:   04/11/2024    Order Specific Question:   Release to patient    Answer:   Immediate   Ferritin    Standing Status:   Future    Standing Expiration Date:   04/11/2024    Order Specific Question:   Release to patient    Answer:   Immediate   Iron and TIBC    Standing Status:   Future    Standing Expiration Date:   04/11/2024    Order Specific Question:   Release to patient    Answer:   Immediate   Vitamin B12    Standing Status:   Future    Standing Expiration Date:   04/11/2024    Order Specific Question:   Release to patient    Answer:   Immediate   Folate    Standing Status:   Future    Standing Expiration Date:   04/11/2024    Order Specific Question:   Release to patient    Answer:   Immediate      I,Helena R Teague,acting as a scribe for Doreatha Massed, MD.,have documented all relevant documentation on the behalf of Doreatha Massed, MD,as directed by  Doreatha Massed, MD while in the presence of Doreatha Massed, MD.  I, Doreatha Massed MD, have reviewed the above documentation for accuracy and completeness, and I agree with the above.    Doreatha Massed, MD   9/3/20243:04 PM  CHIEF COMPLAINT:   Diagnosis: transverse colon cancer    Cancer Staging  Cancer of transverse colon [facility]) Staging form: Colon and Rectum, AJCC 8th Edition - Clinical stage from 09/29/2018: Stage IIB (cT4a, cN0, cM0) - Signed by Doreatha Massed, MD on 09/29/2018    Prior Therapy: 1. Colon resection with colostomy in 09/2007. 2. FOLFOX and radiation from 09/2007 through 02/2008 3. 12 cycles of infusional 5-FU and leucovorin from 10/09/2018 through 03/21/2019.  Current Therapy:  Surveillance     HISTORY OF PRESENT ILLNESS:   Oncology  History  Cancer of transverse colon (HCC)  09/23/2018 Initial Diagnosis   Cancer of transverse colon (HCC)   09/29/2018 Cancer Staging   Staging form: Colon and Rectum, AJCC 8th Edition - Clinical stage from 09/29/2018: Stage IIB (cT4a, cN0, cM0) - Signed by Doreatha Massed, MD on 09/29/2018   10/09/2018 - 03/23/2019 Chemotherapy   The patient had ondansetron (ZOFRAN) 8 mg in sodium chloride 0.9 % 50 mL IVPB, 8 mg (100 % of original dose 8 mg), Intravenous,  Once, 12 of 12 cycles Dose modification: 8 mg (original dose 8 mg, Cycle 1) Administration: 8 mg (10/09/2018), 8 mg (10/23/2018), 8 mg (11/06/2018), 8 mg (11/20/2018), 8 mg (12/04/2018), 8 mg (12/18/2018), 8 mg (01/02/2019), 8 mg (01/15/2019), 8 mg (02/06/2019), 8 mg (02/21/2019), 8 mg (03/07/2019), 8 mg (03/21/2019) leucovorin 800 mg in dextrose 5 % 250 mL infusion, 820 mg, Intravenous,  Once, 12 of 12 cycles Administration: 800 mg (10/09/2018), 820 mg (10/23/2018), 820 mg (11/06/2018), 820 mg (11/20/2018), 820 mg (12/04/2018), 820 mg (12/18/2018), 820 mg (01/02/2019), 820 mg (01/15/2019), 820 mg (02/06/2019), 820 mg (02/21/2019), 820 mg (03/07/2019), 820 mg (03/21/2019) fluorouracil (ADRUCIL) chemo injection 800 mg, 400 mg/m2 = 800 mg, Intravenous,  Once, 12 of 12 cycles Administration: 800 mg (10/09/2018), 800 mg (10/23/2018), 800 mg (11/06/2018), 800 mg (11/20/2018), 800 mg (12/04/2018), 800 mg (12/18/2018), 800 mg (01/02/2019), 800 mg (01/15/2019), 800 mg (02/06/2019), 800 mg (02/21/2019), 800 mg (03/07/2019), 800 mg (03/21/2019) fluorouracil (ADRUCIL) 4,900 mg in sodium chloride 0.9 % 52 mL chemo infusion, 2,400 mg/m2 = 4,900 mg, Intravenous, 1 Day/Dose, 12 of 12 cycles Administration: 4,900 mg (10/09/2018), 4,900 mg (10/23/2018), 5,000 mg (11/06/2018), 5,000 mg (11/20/2018), 5,000 mg (12/04/2018), 5,000 mg (12/18/2018), 5,000 mg (01/02/2019), 5,000 mg (01/15/2019), 5,000 mg (02/06/2019), 5,000 mg (02/21/2019), 5,000 mg (03/07/2019), 5,000 mg (03/21/2019)  for chemotherapy treatment.        INTERVAL HISTORY:   Jose Lawrence is a 82 y.o. male presenting to clinic today for follow up of transverse colon cancer . He was last seen by me on 10/04/22.  Since his last visit, he underwent CT A/P on 04/04/23 that found: no findings suspicious for metastatic disease in the abdomen or pelvis; progressive morphologic changes in the liver suspicious for cirrhosis; no focal hepatic lesion identified; new generalized edema throughout the subcutaneous and intra-abdominal fat with a small to moderate amount of ascites; bilateral pleural effusions; mild diffuse small bowel wall thickening; and possible small enhancing bladder lesion on the left, suspicious for urothelial neoplasm.   She has an EGD scheduled on 04/26/23 with Dr. Levon Hedger.   Today, he states that he is doing well overall. His appetite level is at 100%. His energy level is at 50%. He  is accompanied by his wife.   He denies any changes in his BM's, including hematochezia or black stools. He denies hematuria, though he does note his urine is frequently dark. He reports he has urinary urgency followed bu urinary hesitancy. He reports bruising and bleeding easily. He is taking Vitamin D as prescribed. He is not taking Vitamin B-12.   PAST MEDICAL HISTORY:   Past Medical History: Past Medical History:  Diagnosis Date   Anemia    Causing interruption in anticoagulation   Atrial fibrillation and flutter (HCC)    BPH (benign prostatic hypertrophy)    Colon cancer (HCC)    Status post LAR 2008   Hypothyroidism    Lacunar infarction (HCC) 11/2012   RT 3rd NERVE PALSY, SB Dr. Lita Mains   PE (pulmonary embolism)    Temporarily on Eliquis 2017   Skin cancer    35 years ago   Type 2 diabetes mellitus (HCC)    Vitamin B 12 deficiency 07/10/2019    Surgical History: Past Surgical History:  Procedure Laterality Date   ABDOMINOPERINEAL PROCTOCOLECTOMY  2009   end colostomy    BIOPSY  08/25/2018   Procedure: BIOPSY;  Surgeon: Malissa Hippo,  MD;  [facility];  Service: Endoscopy;;   BIOPSY  10/04/2021   Procedure: BIOPSY;  Surgeon: Malissa Hippo, MD;  [facility];  Service: Endoscopy;;  small, bowel   BLADDER REPAIR  08/29/2018   Procedure: BLADDER REPAIR;  Surgeon: Lucretia Roers, MD;  Location: [facility];  Service: General;;   COLECTOMY  2008   COLONOSCOPY WITH PROPOFOL N/A 08/25/2018   Procedure: COLONOSCOPY WITH PROPOFOL;  Surgeon: Malissa Hippo, MD;  [facility];  Service: Endoscopy;  Laterality: N/A;  2:20   COLOSTOMY     EXPLORATORY LAPAROTOMY  2017   ? pneumoperitoneum of unknown etiology/ negative Ex lap   GIVENS CAPSULE STUDY  10/04/2021   Procedure: GIVENS CAPSULE STUDY;  Surgeon: Malissa Hippo, MD;  [facility];  Service: Endoscopy;;   ILEOSCOPY N/A 10/04/2021   Procedure: ILEOSCOPY THROUGH STOMA;  Surgeon: Malissa Hippo, MD;  [facility];  Service: Endoscopy;  Laterality: N/A;   ILEOSTOMY  08/29/2018   Procedure: ILEOSTOMY;  Surgeon: Lucretia Roers, MD;  Location: [facility];  Service: General;;   LAPAROSCOPIC CHOLECYSTECTOMY  2007   LYSIS OF ADHESION  08/29/2018   Procedure: LYSIS OF ADHESION;  Surgeon: Lucretia Roers, MD;  Location: [facility];  Service: General;;   PARTIAL COLECTOMY N/A 08/29/2018   Procedure: PARTIAL COLECTOMY;  Surgeon: Lucretia Roers, MD;  Location: [facility];  Service: General;  Laterality: N/A;   PORTACATH PLACEMENT Left 10/04/2018   Procedure: INSERTION PORT-A-CATH (attached catheter left subclavian);  Surgeon: Lucretia Roers, MD;  Location: [facility];  Service: General;  Laterality: Left;   SBO SURGERY  06/2009    Social History: Social History   Socioeconomic History   Marital status: Married    Spouse name: Not on file   Number of children: Not on file   Years of education: Not on file   Highest education level: Not on file  Occupational History   Occupation: Holiday representative    Comment: [employer]  Tobacco Use    Smoking status: Former    Current packs/day: 0.00    Average packs/day: 1 pack/day for 27.0 years (27.0 ttl pk-yrs)    Types: Cigarettes    Start date: 01/27/1964    Quit  date: 01/27/1991    Years since quitting: 32.2   Smokeless tobacco: Former    Types: Chew    Quit date: 08/10/1995  Vaping Use   Vaping status: Never Used  Substance and Sexual Activity   Alcohol use: Never    Alcohol/week: 0.0 standard drinks of alcohol   Drug use: Never   Sexual activity: Not Currently  Other Topics Concern   Not on file  Social History Narrative   Not on file   Social Determinants of Health   Financial Resource Strain: Low Risk  (09/01/2020)   Overall Financial Resource Strain (CARDIA)    Difficulty of Paying Living Expenses: Not hard at all  Food Insecurity: No Food Insecurity (09/01/2020)   Hunger Vital Sign    Worried About Running Out of Food in the Last Year: Never true    Ran Out of Food in the Last Year: Never true  Transportation Needs: No Transportation Needs (09/01/2020)   PRAPARE - Administrator, Civil Service (Medical): No    Lack of Transportation (Non-Medical): No  Physical Activity: Sufficiently Active (09/01/2020)   Exercise Vital Sign    Days of Exercise per Week: 7 days    Minutes of Exercise per Session: 30 min  Stress: No Stress Concern Present (09/01/2020)   [employer] of Occupational Health - Occupational Stress Questionnaire    Feeling of Stress : Not at all  Social Connections: Socially Integrated (09/01/2020)   Social Connection and Isolation Panel [NHANES]    Frequency of Communication with Friends and Family: More than three times a week    Frequency of Social Gatherings with Friends and Family: More than three times a week    Attends Religious Services: More than 4 times per year    Active Member of [employer] or Organizations: Yes    Attends Engineer, structural: More than 4 times per year    Marital Status: Married  Catering manager  Violence: Not At Risk (09/01/2020)   Humiliation, Afraid, Rape, and Kick questionnaire    Fear of Current or Ex-Partner: No    Emotionally Abused: No    Physically Abused: No    Sexually Abused: No    Family History: Family History  Problem Relation Age of Onset   Heart disease Father    Heart failure Father    Heart attack Father    Prostate cancer Paternal Uncle     Current Medications:  Current Outpatient Medications:    Ergocalciferol (VITAMIN D2) 50 MCG (2000 UT) TABS, Take by mouth. Once daily, Disp: , Rfl:    lactose free nutrition (BOOST) LIQD, Take 237 mLs by mouth daily at 6 (six) AM., Disp: , Rfl:    levothyroxine (SYNTHROID, LEVOTHROID) 50 MCG tablet, Take 50 mcg by mouth daily before breakfast., Disp: , Rfl:    loperamide (IMODIUM) 2 MG capsule, Take 2 capsules (4 mg total) by mouth 4 (four) times daily -  before meals and at bedtime., Disp: 240 capsule, Rfl: 3   metoprolol tartrate (LOPRESSOR) 25 MG tablet, Take 0.5 tablets (12.5 mg total) by mouth 2 (two) times daily. Hold if Heart Rate < 70 bpm, Disp: 30 tablet, Rfl: 2   ONETOUCH VERIO test strip, 1 each 2 (two) times daily., Disp: , Rfl:    tamsulosin (FLOMAX) 0.4 MG CAPS capsule, Take 1 capsule (0.4 mg total) by mouth daily after supper., Disp: 30 capsule, Rfl: 3   Vitamin D, Ergocalciferol, (DRISDOL) 1.25 MG (50000 UNIT) CAPS capsule, Take  1 capsule by mouth once a week, Disp: 16 capsule, Rfl: 0   Allergies: No Known Allergies  REVIEW OF SYSTEMS:   Review of Systems  Constitutional:  Negative for chills, fatigue and fever.  HENT:   Negative for lump/mass, mouth sores, nosebleeds, sore throat and trouble swallowing.   Eyes:  Negative for eye problems.  Respiratory:  Positive for shortness of breath. Negative for cough.   Cardiovascular:  Negative for chest pain, leg swelling and palpitations.  Gastrointestinal:  Negative for abdominal pain, constipation, diarrhea, nausea and vomiting.  Genitourinary:   Negative for bladder incontinence, difficulty urinating, dysuria, frequency, hematuria and nocturia.        +urinary hesitancy  Musculoskeletal:  Negative for arthralgias, back pain, flank pain, myalgias and neck pain.  Skin:  Negative for itching and rash.  Neurological:  Positive for dizziness. Negative for headaches and numbness.       +tingling in fingertips and feet  Hematological:  Bruises/bleeds easily.  Psychiatric/Behavioral:  Positive for sleep disturbance. Negative for depression and suicidal ideas. The patient is not nervous/anxious.   All other systems reviewed and are negative.    VITALS:   Blood pressure (!) 147/68, pulse (!) 59, temperature 98.1 F (36.7 C), temperature source Oral, resp. rate 18, height 6' (1.829 m), weight 199 lb 6.4 oz (90.4 kg), SpO2 100%.  Wt Readings from Last 3 Encounters:  04/12/23 199 lb 6.4 oz (90.4 kg)  04/07/23 202 lb 9.6 oz (91.9 kg)  11/02/22 175 lb (79.4 kg)    Body mass index is 27.04 kg/m.  Performance status (ECOG): 1 - Symptomatic but completely ambulatory  PHYSICAL EXAM:   Physical Exam Vitals and nursing note reviewed. Exam conducted with a chaperone present.  Constitutional:      Appearance: Normal appearance.  Cardiovascular:     Rate and Rhythm: Normal rate and regular rhythm.     Pulses: Normal pulses.     Heart sounds: Normal heart sounds.  Pulmonary:     Effort: Pulmonary effort is normal.     Breath sounds: Normal breath sounds.  Abdominal:     Palpations: Abdomen is soft. There is no hepatomegaly, splenomegaly or mass.     Tenderness: There is no abdominal tenderness.  Musculoskeletal:     Right lower leg: No edema.     Left lower leg: No edema.  Lymphadenopathy:     Cervical: No cervical adenopathy.     Right cervical: No superficial, deep or posterior cervical adenopathy.    Left cervical: No superficial, deep or posterior cervical adenopathy.     Upper Body:     Right upper body: No supraclavicular or  axillary adenopathy.     Left upper body: No supraclavicular or axillary adenopathy.  Neurological:     General: No focal deficit present.     Mental Status: He is alert and oriented to person, place, and time.  Psychiatric:        Mood and Affect: Mood normal.        Behavior: Behavior normal.     LABS:      Latest Ref Rng & Units 04/04/2023   12:58 PM 11/02/2022    1:05 PM 09/27/2022   11:36 AM  CBC  WBC 4.0 - 10.5 K/uL 5.1  4.2  4.7   Hemoglobin 13.0 - 17.0 g/dL 16.1  09.6  04.5   Hematocrit 39.0 - 52.0 % 32.9  37.1  38.9   Platelets 150 - 400 K/uL 77  67  76       Latest Ref Rng & Units 04/04/2023   12:58 PM 09/27/2022   11:36 AM 03/23/2022    8:56 AM  CMP  Glucose 70 - 99 mg/dL 784  696  295   BUN 8 - 23 mg/dL 16  14  20    Creatinine 0.61 - 1.24 mg/dL 2.84  1.32  4.40   Sodium 135 - 145 mmol/L 134  136  137   Potassium 3.5 - 5.1 mmol/L 4.7  4.6  4.3   Chloride 98 - 111 mmol/L 105  105  107   CO2 22 - 32 mmol/L 24  27  25    Calcium 8.9 - 10.3 mg/dL 8.3  8.7  9.5   Total Protein 6.5 - 8.1 g/dL 6.6  7.0  7.9   Total Bilirubin 0.3 - 1.2 mg/dL 2.7  3.1  1.8   Alkaline Phos 38 - 126 U/L 124  162  189   AST 15 - 41 U/L 59  90  56   ALT 0 - 44 U/L 38  58  45      Lab Results  Component Value Date   CEA1 3.3 04/04/2023   /  CEA  Date Value Ref Range Status  04/04/2023 3.3 0.0 - 4.7 ng/mL Final    Comment:    (NOTE)                             Nonsmokers          <3.9                             Smokers             <5.6 Roche [employer]ostics Electrochemiluminescence Immunoassay (ECLIA) Values obtained with different assay methods or kits cannot be used interchangeably.  Results cannot be interpreted as absolute evidence of the presence or absence of malignant disease. Performed At: [facility] [address] [place], [place] [id number] Jolene Schimke MD [id number]    No results found for: "PSA1" No results found for: "323 [id number]" No results found for:  "CAN125"  No results found for: "TOTALPROTELP", "ALBUMINELP", "A1GS", "A2GS", "BETS", "BETA2SER", "GAMS", "MSPIKE", "SPEI" Lab Results  Component Value Date   TIBC 204 (L) 09/27/2022   IRONPCTSAT 57 (H) 09/27/2022   Lab Results  Component Value Date   LDH 144 10/08/2019   LDH 142 06/21/2019   LDH 140 01/02/2019     STUDIES:   CT Abdomen Pelvis W Contrast  Result Date: 04/07/2023 CLINICAL DATA:  Restaging transverse colon cancer with resection and colostomy in 2009. Prior chemotherapy and radiation therapy. History of bladder repair. * Tracking Code: BO * EXAM: CT ABDOMEN AND PELVIS WITH CONTRAST TECHNIQUE: Multidetector CT imaging of the abdomen and pelvis was performed using the standard protocol following bolus administration of intravenous contrast. RADIATION DOSE REDUCTION: This exam was performed according to the departmental dose-optimization program which includes automated exposure control, adjustment of the mA and/or kV according to patient size and/or use of iterative reconstruction technique. CONTRAST:  OMNIPAQUE IOHEXOL 300 MG/ML  SOLN COMPARISON:  Abdominopelvic CT 03/23/2022 and 10/03/2021. FINDINGS: Lower chest: New moderate right and small left pleural effusions with associated mild compressive atelectasis at both lung bases. Atherosclerosis of the aorta and coronary arteries. Hepatobiliary: Progressive contour irregularity of the liver and generalized contraction suspicious for cirrhosis. No focal hepatic lesion or  abnormal enhancement identified. Status post cholecystectomy without evidence of biliary dilatation. Pancreas: Unremarkable. No pancreatic ductal dilatation or surrounding inflammatory changes. Spleen: Normal in size without focal abnormality. Adrenals/Urinary Tract: Both adrenal glands appear normal. No evidence of urinary tract calculus, suspicious renal lesion or hydronephrosis. Suspicion of possible small enhancing bladder lesion on the left, measuring 1.2 x  0.7 cm transverse on image 69/2 and 1.1 cm on coronal image 84/6. The bladder otherwise appears unremarkable. Stomach/Bowel: Enteric contrast has passed into the ileostomy. Stable small hiatal hernia containing fat and fluid. The stomach appears unremarkable for its degree of distention. Mild generalized small bowel wall thickening, likely related to the patient's ascites. No bowel distension or focal mucosal abnormality identified. Previous total colectomy. Grossly stable chronic scarring and calcifications in the presacral space without evidence of enlarging focal fluid collection. Vascular/Lymphatic: There are no enlarged abdominal or pelvic lymph nodes. Minimally more prominent inguinal lymph nodes are not pathologically enlarged, likely reactive. Mild iliac atherosclerosis. No evidence of aneurysm or large vessel occlusion. The portal, superior mesenteric and splenic veins are patent. Reproductive: The prostate gland and seminal vesicles appear unremarkable. Other: New generalized edema throughout the subcutaneous and intra-abdominal fat with a small to moderate amount of ascites. No peritoneal nodularity identified. No pneumoperitoneum. Musculoskeletal: No acute or significant osseous findings. Stable osseous demineralization and mild multilevel spondylosis. Incidental synovial herniation pit noted anteriorly in the right femoral head, stable. Unless specific follow-up recommendations are mentioned in the findings or impression sections, no imaging follow-up of any mentioned incidental findings is recommended. IMPRESSION: 1. No findings suspicious for metastatic disease in the abdomen or pelvis. 2. Progressive morphologic changes in the liver suspicious for cirrhosis. No focal hepatic lesion identified. 3. New generalized edema throughout the subcutaneous and intra-abdominal fat with a small to moderate amount of ascites, bilateral pleural effusions and mild diffuse small bowel wall thickening. Findings are  most likely secondary to third spacing of fluid of undetermined etiology. Correlate clinically. 4. Possible small enhancing bladder lesion on the left, suspicious for urothelial neoplasm. Recommend Urology consultation and consideration of cystoscopy. 5.  Aortic Atherosclerosis (ICD10-I70.0). Electronically Signed   By: Carey Bullocks M.D.   On: 04/07/2023 15:15

## 2023-04-12 NOTE — Patient Instructions (Addendum)
[facility]  Discharge Instructions  You were seen and examined today by Dr. Ellin Saba.  Dr. Ellin Saba discussed your most recent lab work and CT scan which revealed that you do have a spot on your bladder.  Dr. Ellin Saba will refer you to Urology for the spot on your bladder.  Follow-up as scheduled in 6 months.     Thank you for choosing [facility] to provide your oncology and hematology care.   To afford each patient quality time with our provider, please arrive at least 15 minutes before your scheduled appointment time. You may need to reschedule your appointment if you arrive late (10 or more minutes). Arriving late affects you and other patients whose appointments are after yours.  Also, if you miss three or more appointments without notifying the office, you may be dismissed from the clinic at the provider's discretion.    Again, thank you for choosing [facility].  Our hope is that these requests will decrease the amount of time that you wait before being seen by our physicians.   If you have a lab appointment with the Cancer Center - please note that after April 8th, all labs will be drawn in the cancer center.  You do not have to check in or register with the main entrance as you have in the past but will complete your check-in at the cancer center.            _____________________________________________________________  Should you have questions after your visit to [facility], please contact our office at [phone] and follow the prompts.  Our office hours are 8:00 a.m. to 4:30 p.m. Monday - Thursday and 8:00 a.m. to 2:30 p.m. Friday.  Please note that voicemails left after 4:00 p.m. may not be returned until the following business day.  We are closed weekends and all major holidays.  You do have access to a nurse 24-7, just call the main number to the clinic [phone] and do not press  any options, hold on the line and a nurse will answer the phone.    For prescription refill requests, have your pharmacy contact our office and allow 72 hours.    Masks are no longer required in the cancer centers. If you would like for your care team to wear a mask while they are taking care of you, please let them know. You may have one support person who is at least 80 years old accompany you for your appointments.

## 2023-04-13 ENCOUNTER — Encounter (INDEPENDENT_AMBULATORY_CARE_PROVIDER_SITE_OTHER): Payer: Self-pay

## 2023-04-13 DIAGNOSIS — K754 Autoimmune hepatitis: Secondary | ICD-10-CM | POA: Insufficient documentation

## 2023-04-13 DIAGNOSIS — K7460 Unspecified cirrhosis of liver: Secondary | ICD-10-CM | POA: Insufficient documentation

## 2023-04-19 ENCOUNTER — Ambulatory Visit (HOSPITAL_COMMUNITY)
Admission: RE | Admit: 2023-04-19 | Discharge: 2023-04-19 | Disposition: A | Discharge status: Home / Self Care | Payer: Medicare Other | Source: Ambulatory Visit | Attending: Gastroenterology | Admitting: Gastroenterology

## 2023-04-19 DIAGNOSIS — Z9049 Acquired absence of other specified parts of digestive tract: Secondary | ICD-10-CM | POA: Diagnosis not present

## 2023-04-19 DIAGNOSIS — R188 Other ascites: Secondary | ICD-10-CM | POA: Diagnosis not present

## 2023-04-19 DIAGNOSIS — K7460 Unspecified cirrhosis of liver: Secondary | ICD-10-CM | POA: Diagnosis not present

## 2023-04-19 DIAGNOSIS — K7689 Other specified diseases of liver: Secondary | ICD-10-CM | POA: Diagnosis not present

## 2023-04-20 NOTE — Patient Instructions (Signed)
20    Your procedure is scheduled on: 04/26/2023  Report to [facility] Main Entrance at   9:30  AM.  Call this number if you have problems the morning of surgery: [phone]   Remember:   Follow instructions on letter from office regarding when to stop eating and drinking        No Smoking the day of procedure      Take these medicines the morning of surgery with A SIP OF WATER: levothyroxine and Metoprolol if needed No diabetic medications am of procedure   Do not wear jewelry, make-up or nail polish.  Do not wear lotions, powders, or perfumes. You may wear deodorant.                Do not bring valuables to the hospital.  Contacts, dentures or bridgework may not be worn into surgery.  Leave suitcase in the car. After surgery it may be brought to your room.  For patients admitted to the hospital, checkout time is 11:00 AM the day of discharge.   Patients discharged the day of surgery will not be allowed to drive home. Upper Endoscopy, Adult Upper endoscopy is a procedure to look inside the upper GI (gastrointestinal) tract. The upper GI tract is made up of: The part of the body that moves food from your mouth to your stomach (esophagus). The stomach. The first part of your small intestine (duodenum). This procedure is also called esophagogastroduodenoscopy (EGD) or gastroscopy. In this procedure, your health care provider passes a thin, flexible tube (endoscope) through your mouth and down your esophagus into your stomach. A small camera is attached to the end of the tube. Images from the camera appear on a monitor in the exam room. During this procedure, your health care provider may also remove a small piece of tissue to be sent to a lab and examined under a microscope (biopsy). Your health care provider may do an upper endoscopy to diagnose cancers of the upper GI tract. You may also have this procedure to find the cause of other conditions, such as: Stomach pain. Heartburn. Pain or  problems when swallowing. Nausea and vomiting. Stomach bleeding. Stomach ulcers. Tell a health care provider about: Any allergies you have. All medicines you are taking, including vitamins, herbs, eye drops, creams, and over-the-counter medicines. Any problems you or family members have had with anesthetic medicines. Any blood disorders you have. Any surgeries you have had. Any medical conditions you have. Whether you are pregnant or may be pregnant. What are the risks? Generally, this is a safe procedure. However, problems may occur, including: Infection. Bleeding. Allergic reactions to medicines. A tear or hole (perforation) in the esophagus, stomach, or duodenum. What happens before the procedure? Staying hydrated Follow instructions from your health care provider about hydration, which may include: Up to 4 hours before the procedure - you may continue to drink clear liquids, such as water, clear fruit juice, black coffee, and plain tea.   Medicines Ask your health care provider about: Changing or stopping your regular medicines. This is especially important if you are taking diabetes medicines or blood thinners. Taking medicines such as aspirin and ibuprofen. These medicines can thin your blood. Do not take these medicines unless your health care provider tells you to take them. Taking over-the-counter medicines, vitamins, herbs, and supplements. General instructions Plan to have someone take you home from the hospital or clinic. If you will be going home right after the procedure, plan to have  someone with you for 24 hours. Ask your health care provider what steps will be taken to help prevent infection. What happens during the procedure?  An IV will be inserted into one of your veins. You may be given one or more of the following: A medicine to help you relax (sedative). A medicine to numb the throat (local anesthetic). You will lie on your left side on an exam  table. Your health care provider will pass the endoscope through your mouth and down your esophagus. Your health care provider will use the scope to check the inside of your esophagus, stomach, and duodenum. Biopsies may be taken. The endoscope will be removed. The procedure may vary among health care providers and hospitals. What happens after the procedure? Your blood pressure, heart rate, breathing rate, and blood oxygen level will be monitored until you leave the hospital or clinic. Do not drive for 24 hours if you were given a sedative during your procedure. When your throat is no longer numb, you may be given some fluids to drink. It is up to you to get the results of your procedure. Ask your health care provider, or the department that is doing the procedure, when your results will be ready. Summary Upper endoscopy is a procedure to look inside the upper GI tract. During the procedure, an IV will be inserted into one of your veins. You may be given a medicine to help you relax. A medicine will be used to numb your throat. The endoscope will be passed through your mouth and down your esophagus. This information is not intended to replace advice given to you by your health care provider. Make sure you discuss any questions you have with your health care provider. Document Revised: 01/18/2018 Document Reviewed: 12/26/2017 Elsevier Patient Education  2020 [employer]                                                                                                                                      EndoscopyCare After  Please read the instructions outlined below and refer to this sheet in the next few weeks. These discharge instructions provide you with general information on caring for yourself after you leave the hospital. Your doctor may also give you specific instructions. While your treatment has been planned according to the most current medical practices available, unavoidable  complications occasionally occur. If you have any problems or questions after discharge, please call your doctor. HOME CARE INSTRUCTIONS Activity You may resume your regular activity but move at a slower pace for the next 24 hours.  Take frequent rest periods for the next 24 hours.  Walking will help expel (get rid of) the air and reduce the bloated feeling in your abdomen.  No driving for 24 hours (because of the anesthesia (medicine) used during the test).  You may shower.  Do not sign any important legal documents or operate any machinery  for 24 hours (because of the anesthesia used during the test).  Nutrition Drink plenty of fluids.  You may resume your normal diet.  Begin with a light meal and progress to your normal diet.  Avoid alcoholic beverages for 24 hours or as instructed by your caregiver.  Medications You may resume your normal medications unless your caregiver tells you otherwise. What you can expect today You may experience abdominal discomfort such as a feeling of fullness or "gas" pains.  You may experience a sore throat for 2 to 3 days. This is normal. Gargling with salt water may help this.  Follow-up Your doctor will discuss the results of your test with you. SEEK IMMEDIATE MEDICAL CARE IF: You have excessive nausea (feeling sick to your stomach) and/or vomiting.  You have severe abdominal pain and distention (swelling).  You have trouble swallowing.  You have a temperature over 100 F (37.8 C).  You have rectal bleeding or vomiting of blood.  Document Released: 03/09/2004 Document Revised: 07/15/2011 Document Reviewed: 09/20/2007

## 2023-04-21 ENCOUNTER — Encounter (HOSPITAL_COMMUNITY): Payer: Self-pay

## 2023-04-21 ENCOUNTER — Encounter (HOSPITAL_COMMUNITY)
Admission: RE | Admit: 2023-04-21 | Discharge: 2023-04-21 | Disposition: A | Discharge status: Home / Self Care | Payer: Medicare Other | Source: Ambulatory Visit | Attending: Gastroenterology | Admitting: Gastroenterology

## 2023-04-21 ENCOUNTER — Other Ambulatory Visit: Payer: Self-pay

## 2023-04-21 VITALS — BP 147/68 | HR 58 | Temp 98.1°F | Resp 18 | Ht 72.0 in | Wt 199.4 lb

## 2023-04-21 DIAGNOSIS — R509 Fever, unspecified: Secondary | ICD-10-CM | POA: Diagnosis not present

## 2023-04-21 DIAGNOSIS — C184 Malignant neoplasm of transverse colon: Secondary | ICD-10-CM | POA: Diagnosis not present

## 2023-04-21 DIAGNOSIS — N401 Enlarged prostate with lower urinary tract symptoms: Secondary | ICD-10-CM | POA: Diagnosis present

## 2023-04-21 DIAGNOSIS — I482 Chronic atrial fibrillation, unspecified: Secondary | ICD-10-CM | POA: Diagnosis not present

## 2023-04-21 DIAGNOSIS — E119 Type 2 diabetes mellitus without complications: Secondary | ICD-10-CM | POA: Diagnosis not present

## 2023-04-21 DIAGNOSIS — R9431 Abnormal electrocardiogram [ECG] [EKG]: Secondary | ICD-10-CM | POA: Insufficient documentation

## 2023-04-21 DIAGNOSIS — A419 Sepsis, unspecified organism: Secondary | ICD-10-CM | POA: Diagnosis not present

## 2023-04-21 DIAGNOSIS — R053 Chronic cough: Secondary | ICD-10-CM | POA: Diagnosis present

## 2023-04-21 DIAGNOSIS — Z85828 Personal history of other malignant neoplasm of skin: Secondary | ICD-10-CM | POA: Diagnosis not present

## 2023-04-21 DIAGNOSIS — A409 Streptococcal sepsis, unspecified: Secondary | ICD-10-CM | POA: Diagnosis not present

## 2023-04-21 DIAGNOSIS — R6521 Severe sepsis with septic shock: Secondary | ICD-10-CM | POA: Diagnosis not present

## 2023-04-21 DIAGNOSIS — Z79899 Other long term (current) drug therapy: Secondary | ICD-10-CM | POA: Diagnosis not present

## 2023-04-21 DIAGNOSIS — Z1152 Encounter for screening for COVID-19: Secondary | ICD-10-CM | POA: Diagnosis not present

## 2023-04-21 DIAGNOSIS — R059 Cough, unspecified: Secondary | ICD-10-CM | POA: Diagnosis not present

## 2023-04-21 DIAGNOSIS — R3915 Urgency of urination: Secondary | ICD-10-CM | POA: Diagnosis present

## 2023-04-21 DIAGNOSIS — N136 Pyonephrosis: Secondary | ICD-10-CM | POA: Diagnosis not present

## 2023-04-21 DIAGNOSIS — N1330 Unspecified hydronephrosis: Secondary | ICD-10-CM | POA: Diagnosis not present

## 2023-04-21 DIAGNOSIS — R7881 Bacteremia: Secondary | ICD-10-CM | POA: Diagnosis not present

## 2023-04-21 DIAGNOSIS — Z87891 Personal history of nicotine dependence: Secondary | ICD-10-CM | POA: Diagnosis not present

## 2023-04-21 DIAGNOSIS — R188 Other ascites: Secondary | ICD-10-CM | POA: Diagnosis not present

## 2023-04-21 DIAGNOSIS — I4821 Permanent atrial fibrillation: Secondary | ICD-10-CM | POA: Insufficient documentation

## 2023-04-21 DIAGNOSIS — B955 Unspecified streptococcus as the cause of diseases classified elsewhere: Secondary | ICD-10-CM | POA: Diagnosis not present

## 2023-04-21 DIAGNOSIS — K7030 Alcoholic cirrhosis of liver without ascites: Secondary | ICD-10-CM | POA: Diagnosis not present

## 2023-04-21 DIAGNOSIS — R000 Tachycardia, unspecified: Secondary | ICD-10-CM | POA: Diagnosis not present

## 2023-04-21 DIAGNOSIS — R338 Other retention of urine: Secondary | ICD-10-CM | POA: Diagnosis present

## 2023-04-21 DIAGNOSIS — Z79890 Hormone replacement therapy (postmenopausal): Secondary | ICD-10-CM | POA: Diagnosis not present

## 2023-04-21 DIAGNOSIS — Z86711 Personal history of pulmonary embolism: Secondary | ICD-10-CM | POA: Diagnosis not present

## 2023-04-21 DIAGNOSIS — Z933 Colostomy status: Secondary | ICD-10-CM | POA: Diagnosis not present

## 2023-04-21 DIAGNOSIS — B954 Other streptococcus as the cause of diseases classified elsewhere: Secondary | ICD-10-CM | POA: Diagnosis not present

## 2023-04-21 DIAGNOSIS — Z01810 Encounter for preprocedural cardiovascular examination: Secondary | ICD-10-CM | POA: Insufficient documentation

## 2023-04-21 DIAGNOSIS — D696 Thrombocytopenia, unspecified: Secondary | ICD-10-CM | POA: Diagnosis not present

## 2023-04-21 DIAGNOSIS — J90 Pleural effusion, not elsewhere classified: Secondary | ICD-10-CM | POA: Diagnosis not present

## 2023-04-21 DIAGNOSIS — K7460 Unspecified cirrhosis of liver: Secondary | ICD-10-CM | POA: Diagnosis not present

## 2023-04-21 DIAGNOSIS — I10 Essential (primary) hypertension: Secondary | ICD-10-CM | POA: Diagnosis not present

## 2023-04-21 DIAGNOSIS — E039 Hypothyroidism, unspecified: Secondary | ICD-10-CM | POA: Diagnosis not present

## 2023-04-21 DIAGNOSIS — Z85038 Personal history of other malignant neoplasm of large intestine: Secondary | ICD-10-CM | POA: Diagnosis not present

## 2023-04-21 DIAGNOSIS — R339 Retention of urine, unspecified: Secondary | ICD-10-CM | POA: Diagnosis not present

## 2023-04-21 DIAGNOSIS — R0989 Other specified symptoms and signs involving the circulatory and respiratory systems: Secondary | ICD-10-CM | POA: Diagnosis not present

## 2023-04-21 HISTORY — DX: Cardiac arrhythmia, unspecified: I49.9

## 2023-04-22 ENCOUNTER — Other Ambulatory Visit: Payer: Self-pay

## 2023-04-22 ENCOUNTER — Emergency Department (HOSPITAL_COMMUNITY): Payer: Medicare Other

## 2023-04-22 ENCOUNTER — Inpatient Hospital Stay (HOSPITAL_COMMUNITY)
Admission: EM | Admit: 2023-04-22 | Discharge: 2023-04-24 | DRG: 871 | Disposition: A | Discharge status: Home / Self Care | Payer: Medicare Other | Attending: Family Medicine | Admitting: Family Medicine

## 2023-04-22 ENCOUNTER — Encounter (HOSPITAL_COMMUNITY): Payer: Self-pay

## 2023-04-22 DIAGNOSIS — I10 Essential (primary) hypertension: Secondary | ICD-10-CM | POA: Diagnosis present

## 2023-04-22 DIAGNOSIS — B955 Unspecified streptococcus as the cause of diseases classified elsewhere: Secondary | ICD-10-CM | POA: Diagnosis present

## 2023-04-22 DIAGNOSIS — Z85038 Personal history of other malignant neoplasm of large intestine: Secondary | ICD-10-CM

## 2023-04-22 DIAGNOSIS — R509 Fever, unspecified: Secondary | ICD-10-CM | POA: Diagnosis not present

## 2023-04-22 DIAGNOSIS — R339 Retention of urine, unspecified: Secondary | ICD-10-CM | POA: Diagnosis not present

## 2023-04-22 DIAGNOSIS — K7460 Unspecified cirrhosis of liver: Secondary | ICD-10-CM | POA: Diagnosis not present

## 2023-04-22 DIAGNOSIS — Z1152 Encounter for screening for COVID-19: Secondary | ICD-10-CM

## 2023-04-22 DIAGNOSIS — N1330 Unspecified hydronephrosis: Secondary | ICD-10-CM | POA: Diagnosis not present

## 2023-04-22 DIAGNOSIS — A409 Streptococcal sepsis, unspecified: Principal | ICD-10-CM | POA: Diagnosis present

## 2023-04-22 DIAGNOSIS — Z85828 Personal history of other malignant neoplasm of skin: Secondary | ICD-10-CM

## 2023-04-22 DIAGNOSIS — C184 Malignant neoplasm of transverse colon: Secondary | ICD-10-CM | POA: Diagnosis not present

## 2023-04-22 DIAGNOSIS — Z9049 Acquired absence of other specified parts of digestive tract: Secondary | ICD-10-CM

## 2023-04-22 DIAGNOSIS — R188 Other ascites: Secondary | ICD-10-CM | POA: Diagnosis not present

## 2023-04-22 DIAGNOSIS — K7030 Alcoholic cirrhosis of liver without ascites: Secondary | ICD-10-CM | POA: Diagnosis not present

## 2023-04-22 DIAGNOSIS — Z79899 Other long term (current) drug therapy: Secondary | ICD-10-CM

## 2023-04-22 DIAGNOSIS — R053 Chronic cough: Secondary | ICD-10-CM | POA: Diagnosis present

## 2023-04-22 DIAGNOSIS — R338 Other retention of urine: Secondary | ICD-10-CM | POA: Diagnosis present

## 2023-04-22 DIAGNOSIS — E119 Type 2 diabetes mellitus without complications: Secondary | ICD-10-CM | POA: Diagnosis not present

## 2023-04-22 DIAGNOSIS — Z79890 Hormone replacement therapy (postmenopausal): Secondary | ICD-10-CM

## 2023-04-22 DIAGNOSIS — R3915 Urgency of urination: Secondary | ICD-10-CM | POA: Diagnosis present

## 2023-04-22 DIAGNOSIS — A419 Sepsis, unspecified organism: Secondary | ICD-10-CM | POA: Diagnosis not present

## 2023-04-22 DIAGNOSIS — Z87891 Personal history of nicotine dependence: Secondary | ICD-10-CM

## 2023-04-22 DIAGNOSIS — N136 Pyonephrosis: Secondary | ICD-10-CM | POA: Diagnosis not present

## 2023-04-22 DIAGNOSIS — K7290 Hepatic failure, unspecified without coma: Secondary | ICD-10-CM

## 2023-04-22 DIAGNOSIS — Z933 Colostomy status: Secondary | ICD-10-CM

## 2023-04-22 DIAGNOSIS — Z8249 Family history of ischemic heart disease and other diseases of the circulatory system: Secondary | ICD-10-CM

## 2023-04-22 DIAGNOSIS — Z86711 Personal history of pulmonary embolism: Secondary | ICD-10-CM

## 2023-04-22 DIAGNOSIS — R059 Cough, unspecified: Secondary | ICD-10-CM | POA: Diagnosis not present

## 2023-04-22 DIAGNOSIS — Z8042 Family history of malignant neoplasm of prostate: Secondary | ICD-10-CM

## 2023-04-22 DIAGNOSIS — R0989 Other specified symptoms and signs involving the circulatory and respiratory systems: Secondary | ICD-10-CM | POA: Diagnosis not present

## 2023-04-22 DIAGNOSIS — N401 Enlarged prostate with lower urinary tract symptoms: Secondary | ICD-10-CM | POA: Diagnosis present

## 2023-04-22 DIAGNOSIS — N400 Enlarged prostate without lower urinary tract symptoms: Secondary | ICD-10-CM

## 2023-04-22 DIAGNOSIS — E039 Hypothyroidism, unspecified: Secondary | ICD-10-CM | POA: Diagnosis present

## 2023-04-22 DIAGNOSIS — J90 Pleural effusion, not elsewhere classified: Secondary | ICD-10-CM | POA: Diagnosis not present

## 2023-04-22 DIAGNOSIS — R000 Tachycardia, unspecified: Secondary | ICD-10-CM | POA: Diagnosis not present

## 2023-04-22 DIAGNOSIS — D696 Thrombocytopenia, unspecified: Secondary | ICD-10-CM | POA: Diagnosis not present

## 2023-04-22 DIAGNOSIS — R6521 Severe sepsis with septic shock: Secondary | ICD-10-CM | POA: Diagnosis not present

## 2023-04-22 DIAGNOSIS — B954 Other streptococcus as the cause of diseases classified elsewhere: Secondary | ICD-10-CM | POA: Diagnosis not present

## 2023-04-22 DIAGNOSIS — I482 Chronic atrial fibrillation, unspecified: Secondary | ICD-10-CM | POA: Diagnosis present

## 2023-04-22 LAB — CBC WITH DIFFERENTIAL/PLATELET
Abs Immature Granulocytes: 0.02 10*3/uL (ref 0.00–0.07)
Basophils Absolute: 0 10*3/uL (ref 0.0–0.1)
Basophils Relative: 0 %
Eosinophils Absolute: 0 10*3/uL (ref 0.0–0.5)
Eosinophils Relative: 0 %
HCT: 34.7 % — ABNORMAL LOW (ref 39.0–52.0)
Hemoglobin: 11.9 g/dL — ABNORMAL LOW (ref 13.0–17.0)
Immature Granulocytes: 0 %
Lymphocytes Relative: 3 %
Lymphs Abs: 0.2 10*3/uL — ABNORMAL LOW (ref 0.7–4.0)
MCH: 37 pg — ABNORMAL HIGH (ref 26.0–34.0)
MCHC: 34.3 g/dL (ref 30.0–36.0)
MCV: 107.8 fL — ABNORMAL HIGH (ref 80.0–100.0)
Monocytes Absolute: 0.1 10*3/uL (ref 0.1–1.0)
Monocytes Relative: 1 %
Neutro Abs: 4.4 10*3/uL (ref 1.7–7.7)
Neutrophils Relative %: 96 %
Platelets: 61 10*3/uL — ABNORMAL LOW (ref 150–400)
RBC: 3.22 MIL/uL — ABNORMAL LOW (ref 4.22–5.81)
RDW: 14.6 % (ref 11.5–15.5)
WBC: 4.7 10*3/uL (ref 4.0–10.5)
nRBC: 0 % (ref 0.0–0.2)

## 2023-04-22 LAB — RESP PANEL BY RT-PCR (RSV, FLU A&B, COVID)  RVPGX2
Influenza A by PCR: NEGATIVE
Influenza B by PCR: NEGATIVE
Resp Syncytial Virus by PCR: NEGATIVE
SARS Coronavirus 2 by RT PCR: NEGATIVE

## 2023-04-22 LAB — COMPREHENSIVE METABOLIC PANEL
ALT: 40 U/L (ref 0–44)
AST: 70 U/L — ABNORMAL HIGH (ref 15–41)
Albumin: 2.8 g/dL — ABNORMAL LOW (ref 3.5–5.0)
Alkaline Phosphatase: 125 U/L (ref 38–126)
Anion gap: 10 (ref 5–15)
BUN: 12 mg/dL (ref 8–23)
CO2: 22 mmol/L (ref 22–32)
Calcium: 8.8 mg/dL — ABNORMAL LOW (ref 8.9–10.3)
Chloride: 103 mmol/L (ref 98–111)
Creatinine, Ser: 1.26 mg/dL — ABNORMAL HIGH (ref 0.61–1.24)
GFR, Estimated: 58 mL/min — ABNORMAL LOW (ref >60)
Glucose, Bld: 115 mg/dL — ABNORMAL HIGH (ref 70–99)
Potassium: 4 mmol/L (ref 3.5–5.1)
Sodium: 135 mmol/L (ref 135–145)
Total Bilirubin: 4.7 mg/dL — ABNORMAL HIGH (ref 0.3–1.2)
Total Protein: 7 g/dL (ref 6.5–8.1)

## 2023-04-22 LAB — LACTIC ACID, PLASMA
Lactic Acid, Venous: 4.1 mmol/L (ref 0.5–1.9)
Lactic Acid, Venous: 3.1 mmol/L (ref 0.5–1.9)

## 2023-04-22 LAB — URINALYSIS, W/ REFLEX TO CULTURE (INFECTION SUSPECTED)
Bacteria, UA: NONE SEEN
Bilirubin Urine: NEGATIVE
Glucose, UA: NEGATIVE mg/dL
Ketones, ur: NEGATIVE mg/dL
Nitrite: NEGATIVE
Protein, ur: NEGATIVE mg/dL
Specific Gravity, Urine: 1.005 (ref 1.005–1.030)
pH: 5 (ref 5.0–8.0)

## 2023-04-22 LAB — MAGNESIUM: Magnesium: 1.6 mg/dL — ABNORMAL LOW (ref 1.7–2.4)

## 2023-04-22 LAB — PROTIME-INR
INR: 1.9 — ABNORMAL HIGH (ref 0.8–1.2)
Prothrombin Time: 22.4 s — ABNORMAL HIGH (ref 11.4–15.2)

## 2023-04-22 LAB — AMMONIA: Ammonia: 51 umol/L — ABNORMAL HIGH (ref 9–35)

## 2023-04-22 LAB — LIPASE, BLOOD: Lipase: 24 U/L (ref 11–51)

## 2023-04-22 LAB — APTT: aPTT: 35 s (ref 24–36)

## 2023-04-22 MED ORDER — FUROSEMIDE 40 MG PO TABS
40.0000 mg | ORAL_TABLET | Freq: Every day | ORAL | Status: DC
  Administered 2023-04-23 – 2023-04-24 (×2): 40 mg via ORAL
  Filled 2023-04-22 (×2): qty 1

## 2023-04-22 MED ORDER — ACETAMINOPHEN 325 MG PO TABS: 650.0000 mg | ORAL_TABLET | Freq: Four times a day (QID) | ORAL | Status: DC | PRN

## 2023-04-22 MED ORDER — LACTATED RINGERS IV BOLUS
500.0000 mL | Freq: Once | INTRAVENOUS | Status: AC
  Administered 2023-04-22: 500 mL via INTRAVENOUS

## 2023-04-22 MED ORDER — LEVOTHYROXINE SODIUM 50 MCG PO TABS
50.0000 ug | ORAL_TABLET | Freq: Every day | ORAL | Status: DC
  Administered 2023-04-23 – 2023-04-24 (×2): 50 ug via ORAL
  Filled 2023-04-22 (×2): qty 1

## 2023-04-22 MED ORDER — METRONIDAZOLE 500 MG/100ML IV SOLN
500.0000 mg | Freq: Once | INTRAVENOUS | Status: AC
  Administered 2023-04-22: 500 mg via INTRAVENOUS
  Filled 2023-04-22: qty 100

## 2023-04-22 MED ORDER — ONDANSETRON HCL 4 MG PO TABS: 4.0000 mg | ORAL_TABLET | Freq: Four times a day (QID) | ORAL | Status: DC | PRN

## 2023-04-22 MED ORDER — ONDANSETRON HCL 4 MG/2ML IJ SOLN: 4.0000 mg | Freq: Four times a day (QID) | INTRAMUSCULAR | Status: DC | PRN

## 2023-04-22 MED ORDER — TAMSULOSIN HCL 0.4 MG PO CAPS
0.4000 mg | ORAL_CAPSULE | Freq: Every day | ORAL | Status: DC
  Administered 2023-04-22 – 2023-04-23 (×2): 0.4 mg via ORAL
  Filled 2023-04-22 (×2): qty 1

## 2023-04-22 MED ORDER — ENSURE ENLIVE PO LIQD: 237.0000 mL | Freq: Two times a day (BID) | ORAL | Status: DC

## 2023-04-22 MED ORDER — SODIUM CHLORIDE 0.9 % IV SOLN
2.0000 g | Freq: Once | INTRAVENOUS | Status: AC
  Administered 2023-04-22: 2 g via INTRAVENOUS
  Filled 2023-04-22: qty 12.5

## 2023-04-22 MED ORDER — VANCOMYCIN HCL 1500 MG/300ML IV SOLN: 1500.0000 mg | INTRAVENOUS | Status: DC

## 2023-04-22 MED ORDER — ACETAMINOPHEN 650 MG RE SUPP: 650.0000 mg | Freq: Four times a day (QID) | RECTAL | Status: DC | PRN

## 2023-04-22 MED ORDER — IOHEXOL 300 MG/ML  SOLN
100.0000 mL | Freq: Once | INTRAMUSCULAR | Status: AC | PRN
  Administered 2023-04-22: 100 mL via INTRAVENOUS

## 2023-04-22 MED ORDER — ENOXAPARIN SODIUM 40 MG/0.4ML IJ SOSY
40.0000 mg | PREFILLED_SYRINGE | INTRAMUSCULAR | Status: DC
  Administered 2023-04-22 – 2023-04-23 (×2): 40 mg via SUBCUTANEOUS
  Filled 2023-04-22 (×2): qty 0.4

## 2023-04-22 MED ORDER — VANCOMYCIN HCL 1750 MG/350ML IV SOLN
1750.0000 mg | Freq: Once | INTRAVENOUS | Status: AC
  Administered 2023-04-22: 1750 mg via INTRAVENOUS
  Filled 2023-04-22: qty 350

## 2023-04-22 MED ORDER — SODIUM CHLORIDE 0.9 % IV SOLN: 2.0000 g | Freq: Three times a day (TID) | INTRAVENOUS | Status: DC

## 2023-04-22 MED ORDER — MAGNESIUM SULFATE 4 GM/100ML IV SOLN
4.0000 g | Freq: Once | INTRAVENOUS | Status: AC
  Administered 2023-04-22: 4 g via INTRAVENOUS
  Filled 2023-04-22: qty 100

## 2023-04-22 MED ORDER — SODIUM CHLORIDE 0.9 % IV BOLUS
1000.0000 mL | Freq: Once | INTRAVENOUS | Status: AC
  Administered 2023-04-22: 1000 mL via INTRAVENOUS

## 2023-04-22 MED ORDER — SODIUM CHLORIDE 0.9 % IV SOLN
1.0000 g | INTRAVENOUS | Status: DC
  Administered 2023-04-22: 1 g via INTRAVENOUS
  Filled 2023-04-22: qty 10

## 2023-04-22 MED ORDER — VANCOMYCIN HCL IN DEXTROSE 1-5 GM/200ML-% IV SOLN: 1000.0000 mg | Freq: Once | INTRAVENOUS | Status: DC

## 2023-04-22 MED ORDER — ACETAMINOPHEN 500 MG PO TABS
500.0000 mg | ORAL_TABLET | Freq: Once | ORAL | Status: AC
  Administered 2023-04-22: 500 mg via ORAL
  Filled 2023-04-22: qty 1

## 2023-04-22 NOTE — ED Notes (Signed)
See triage notes. Pt resting. A/o. Nad. Denies pain/shob. No resp distress/shob noted. Color wnl.

## 2023-04-22 NOTE — Assessment & Plan Note (Signed)
Outpatient follow up with Oncology. Last follow up with no recurrence.

## 2023-04-22 NOTE — ED Notes (Signed)
Pt taken to ct. Nad. States he feels better

## 2023-04-22 NOTE — H&P (Addendum)
History and Physical    Patient: Jose Lawrence HYQ:[id number] DOB: 11/16/1940 DOA: 04/22/2023 DOS: the patient was seen and examined on 04/22/2023 PCP: Ignatius Specking, MD  Patient coming from: Home  Chief Complaint:  Chief Complaint  Patient presents with   Fever   Vomiting   HPI: Jose Lawrence is a 82 y.o. male with medical history significant of BPH, hypothyroidism, pulmonary embolism, T2DM, colon cancer (sp resection/ colostomy), and cirrhosis who presented with shaking and chills.   He has been at his usual state of health until 3 days ago, when his family noted him having intermittent shaking and chills. Yesterday evening he had more severe and persistent symptoms.   He endorsed having increased urinary frequency and urgency, for about 6 months, only able to void small amounts of urine. This urinary symptoms usually improved while taking diuretics, but for the last few days he has experienced more signs of urinary retention, only voiding a "few table spoons of urine", despite using diuretics.  Denies dyspnea or chest pain. No dysuria or abdominal pain. No increased in urinary output.   This morning his chills were severe in intensity, his temperature at  home reached 102 F. He had malaise, and mild vomiting, with no improving of worsening factors, prompting his family to bring him to the ED.  Last follow up with oncology was 04/12/2023, CT abdomen and pelvis with no evidence of metastatic disease. Had possible small enhancing bladder lesion on the left and plan was to refer to Urology as outpatient.       Review of Systems: As mentioned in the history of present illness. All other systems reviewed and are negative. 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 Lita Mains   PE (pulmonary embolism)    Temporarily on Eliquis 2017   Skin cancer    35 years ago   Type 2 diabetes mellitus (HCC)    Vitamin B 12 deficiency 07/10/2019   Past Surgical History:  Procedure Laterality Date   ABDOMINOPERINEAL PROCTOCOLECTOMY  2009   end colostomy    BIOPSY  08/25/2018   Procedure: BIOPSY;  Surgeon: Malissa Hippo, MD;  Location: [facility];  Service: Endoscopy;;   BIOPSY  10/04/2021   Procedure: BIOPSY;  Surgeon: Malissa Hippo, MD;  Location: [facility];  Service: Endoscopy;;  small, bowel   BLADDER REPAIR  08/29/2018   Procedure: BLADDER REPAIR;  Surgeon: Lucretia Roers, MD;  Location: [facility];  Service: General;;   COLECTOMY  2008   COLONOSCOPY WITH PROPOFOL N/A 08/25/2018   Procedure: COLONOSCOPY WITH PROPOFOL;  Surgeon: Malissa Hippo, MD;  Location: [facility];  Service: Endoscopy;  Laterality: N/A;  2:20   COLOSTOMY     EXPLORATORY LAPAROTOMY  2017   ? pneumoperitoneum of unknown etiology/ negative Ex lap   GIVENS CAPSULE STUDY  10/04/2021   Procedure: GIVENS CAPSULE STUDY;  Surgeon: Malissa Hippo, MD;  Location: [facility];  Service: Endoscopy;;   ILEOSCOPY N/A 10/04/2021   Procedure: ILEOSCOPY THROUGH STOMA;  Surgeon: Malissa Hippo, MD;  [facility]: [facility];  Service: Endoscopy;  Laterality: N/A;  ILEOSTOMY  08/29/2018   Procedure: ILEOSTOMY;  Surgeon: Lucretia Roers, MD;  Location: [facility];  Service: General;;   LAPAROSCOPIC CHOLECYSTECTOMY  2007   LYSIS OF ADHESION  08/29/2018   Procedure: LYSIS OF ADHESION;  Surgeon: Lucretia Roers, MD;  Location: [facility];  Service: General;;   PARTIAL COLECTOMY N/A 08/29/2018   Procedure: PARTIAL COLECTOMY;  Surgeon: Lucretia Roers, MD;  Location: [facility];  Service: General;  Laterality: N/A;   PORTACATH PLACEMENT Left 10/04/2018   Procedure: INSERTION PORT-A-CATH (attached catheter left subclavian);  Surgeon: Lucretia Roers, MD;  Location: [facility];  Service: General;  Laterality: Left;   SBO SURGERY  06/2009   Social History:  reports that he quit smoking about 32 years ago. His smoking use included cigarettes. He started smoking about 59 years ago. He has a 27 pack-year smoking history. He quit smokeless tobacco use about 27 years ago.  His smokeless tobacco use included chew. He reports that he does not drink alcohol and does not use drugs.  No Known Allergies  Family History  Problem Relation Age of Onset   Heart disease Father    Heart failure Father    Heart attack Father    Prostate cancer Paternal Uncle     Prior to Admission medications   Medication Sig Start Date End Date Taking? Authorizing Provider  acetaminophen (TYLENOL) 325 MG suppository Place 325 mg rectally every 4 (four) hours as needed for moderate pain.   Yes [provider]  Ergocalciferol (VITAMIN D2) 50 MCG (2000 UT) TABS Take 1 tablet by mouth daily. Once daily   Yes [provider]  furosemide (LASIX) 40 MG tablet Take 20-40 mg by mouth daily as needed.   Yes [provider]  lactose free nutrition (BOOST) LIQD Take 237 mLs by mouth daily at 6 (six) AM.   Yes [provider]  levothyroxine (SYNTHROID, LEVOTHROID) 50 MCG tablet Take 50 mcg by mouth daily before breakfast.   Yes [provider]  loperamide (IMODIUM) 2 MG capsule Take 2 capsules (4 mg total) by mouth 4 (four) times daily -  before meals and at bedtime. 11/11/18  Yes Lucretia Roers, MD  potassium chloride (KLOR-CON) 10 MEQ tablet Take 10 mEq by mouth daily.   Yes [provider]  tamsulosin (FLOMAX) 0.4 MG CAPS capsule Take 1 capsule (0.4 mg total) by mouth daily after supper. 10/04/21  Yes Emokpae, Courage, MD  Vitamin D, Ergocalciferol, (DRISDOL) 1.25 MG (50000 UNIT) CAPS capsule Take 1 capsule by mouth once a week 02/01/23  Yes Doreatha Massed, MD  [facility] VERIO test strip 1 each 2 (two) times daily.  01/21/22   [provider]    Physical Exam: Vitals:   04/22/23 1430 04/22/23 1436 04/22/23 1609 04/22/23 1630  BP: (!) 137/58   121/62  Pulse: 86   87  Resp: 11  16   Temp:  98.7 F (37.1 C) 98.9 F (37.2 C)   TempSrc:  Oral Oral   SpO2: 94%   97%  Weight:      Height:       Neurology awake and alert, no confusion, no asterixis.  ENT with mild pallor and mild icterus Cardiovascular with S1 and S2 present and regular with no gallops, rubs or murmurs Respiratory with no wheezing or rhonchi Abdomen with colostomy bag in place, normal liquid stool in the collector bag. Abdomen with no distention or dullness to percussion.  Mild to moderate discomfort to  suprapubic palpation. Positive non pitting lower extremity edema +   Data Reviewed:   Na 135, 4.0 Cl 103 bicarbonate 22, glucose 115 bun 12 cr 1,26 Mag 1,6  AST 70, ALT 40, Alk p 125, ammonia 51  Lactic acid 4.1 and 3.1  Wbc 4.7 hgb 11.9 plt 61  Sars covid 19 negative   Urine analysis SG 1,005, protein negative, trace leukocytes, and moderate hgb. Rbc 0-5, wbc 0-5   CT abdomen and pelvis with new bilateral hydronephrosis to the level of the urinary bladder, which demonstrates interval distention with mild circumferential mural thickening. Unchanged focal lesion along the posterior left bladder measuring 1,1 to 0,7 cm. Cirrhotic morphology of the liver with small volume ascites and portal enteropathy. Moderate bilateral pleural effusions.   Chest radiograph with cardiomegaly with no infiltrates or effusions, left sided port with tip in the SVC.   EKG 113 bpm, left axis deviation, qtc 428, normal intervals, atrial fibrillation rhythm with no significant ST segment or T wave changes.   Assessment and Plan: Urinary retention [place]iley patient has BPH.   Plan to placed foley catheter in place to drain urinary bladder. Start patient on empiric antibiotic therapy for urinary tract infection (present on admission). Hold on IV  fluids for now, Ok to resume oral diuretic therapy. Continue with tamsulosin.  Follow up on cultures, cell count and temperature curve.  Plan to discharge home when more stable with foley catheter in place to prevent recurrent urinary retention.  Patient will need outpatient follow up with Urology.  HTN (hypertension) Hold on antihypertensive agents for now. Continue close blood pressure monitoring.   Atrial fibrillation, chronic (HCC) Patient on on AV blocking agents and not on anticoagulation. Currently he is in atrial fibrillation.   Cirrhosis of liver (HCC) Elevated AST in relation to ALT suggesting alcohol related.  Positive thrombocytopenia. Clinically with no significant ascites.  No peritoneal signs.   Plan to continue supportive medical therapy with diuretics. His ammonia is elevated but he has no clinical sings of encephalopathy.   Hypomagnesemia Add 4 g of mag sulfate, and follow up on renal function and electrolytes   Controlled type 2 diabetes mellitus without complication (HCC) Admission glucose is 115 mg/dl Plan to hold on insulin therapy due to risk of hypoglycemia.   Hypothyroidism Continue with levothyroxine.   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 Last follow up with no recurrence.       Advance Care Planning:   Code Status: Full Code   Consults: none/ urology over the phone   Family Communication: I spoke with patient's son at the bedside, we talked in detail about patient's condition, plan of care and prognosis and all questions were addressed.   Severity of Illness: The appropriate patient status for this patient is OBSERVATION. Observation status is judged to be reasonable and necessary in order to provide the required intensity of service to ensure the patient's safety. The patient's presenting symptoms, physical exam findings, and initial radiographic and laboratory data in the context of their medical condition  is felt to place them at decreased risk for further clinical deterioration. Furthermore, it is anticipated that the patient will be medically stable for discharge from the hospital within 2 midnights of admission.   Author: Coralie Keens, MD 04/22/2023 5:35 PM  For on call review [email or web address].

## 2023-04-22 NOTE — Assessment & Plan Note (Signed)
Patient on on AV blocking agents and not on anticoagulation. Currently he is in atrial fibrillation.

## 2023-04-22 NOTE — ED Provider Notes (Signed)
Patient with sepsis of unknown origin.  CT scan shows an enlarged bladder and we will place a Foley catheter in.  Urology stated they can follow-up as an outpatient.  Medicine has been notified and will admit the patient   Bethann Berkshire, MD 04/22/23 1725

## 2023-04-22 NOTE — Sepsis Progress Note (Signed)
eLink is following this Code Sepsis.

## 2023-04-22 NOTE — ED Notes (Signed)
Date and time results received: 04/22/23 1555 (use smartphrase ".now" to insert current time)  Test: lactic acid Critical Value: 3.1  Name of Provider Notified: [place]  Orders Received? Or Actions Taken?: Orders Received - See Orders for details

## 2023-04-22 NOTE — Assessment & Plan Note (Signed)
Hold on antihypertensive agents for now. Continue close blood pressure monitoring.

## 2023-04-22 NOTE — Assessment & Plan Note (Signed)
Add 4 g of mag sulfate, and follow up on renal function and electrolytes

## 2023-04-22 NOTE — ED Provider Notes (Signed)
[place] EMERGENCY DEPARTMENT AT [facility] Provider Note   CSN: [id number] Arrival date & time: 04/22/23  1221     History  Chief Complaint  Patient presents with   Fever   Vomiting    Jose Lawrence is a 82 y.o. male.  HPI 82 year old male presents with fever.  History is from patient's wife and daughter as well as from the patient.  He has been having shaking and chills for the past 3 days without a fever.  Today his temperature was up to 102.  He has had some vomiting and in addition to his chronic cough he is had a worse cough with sputum.  He is also been having abdominal pain.  He denies any headaches and he has not been confused.  No increased output from his ostomy.  No signs of cellulitis.  Patient has multiple significant comorbidities including atrial fibrillation, type 2 diabetes, cirrhosis, hypertension, and many other comorbidities.  Home Medications Prior to Admission medications   Medication Sig Start Date End Date Taking? Authorizing Provider  Ergocalciferol (VITAMIN D2) 50 MCG (2000 UT) TABS Take by mouth. Once daily    [provider]  lactose free nutrition (BOOST) LIQD Take 237 mLs by mouth daily at 6 (six) AM.    [provider]  levothyroxine (SYNTHROID, LEVOTHROID) 50 MCG tablet Take 50 mcg by mouth daily before breakfast.    [provider]  loperamide (IMODIUM) 2 MG capsule Take 2 capsules (4 mg total) by mouth 4 (four) times daily -  before meals and at bedtime. 11/11/18   Lucretia Roers, MD  metoprolol tartrate (LOPRESSOR) 25 MG tablet Take 0.5 tablets (12.5 mg total) by mouth 2 (two) times daily. Hold if Heart Rate < 70 bpm 10/04/21   Shon Hale, MD  [facility] VERIO test strip 1 each 2 (two) times daily. 01/21/22   [provider]  tamsulosin (FLOMAX) 0.4 MG CAPS capsule Take 1 capsule (0.4 mg total) by mouth daily after supper. 10/04/21   Shon Hale, MD  Vitamin D, Ergocalciferol, (DRISDOL)  1.25 MG (50000 UNIT) CAPS capsule Take 1 capsule by mouth once a week 02/01/23   Doreatha Massed, MD      Allergies    Patient has no known allergies.    Review of Systems   Review of Systems  Constitutional:  Positive for chills and fever.  Respiratory:  Positive for cough. Negative for shortness of breath.   Gastrointestinal:  Positive for abdominal pain and vomiting.  Neurological:  Negative for headaches.    Physical Exam Updated Vital Signs BP (!) 149/66 (BP Location: Right Arm)   Pulse (!) 106   Temp (!) 101.6 F (38.7 C) (Oral)   Resp (!) 22   Wt 90 kg   SpO2 98%   BMI 26.91 kg/m  Physical Exam Vitals and nursing note reviewed.  Constitutional:      Appearance: He is well-developed.  HENT:     Head: Normocephalic and atraumatic.  Cardiovascular:     Rate and Rhythm: Tachycardia present. Rhythm irregular.     Heart sounds: Normal heart sounds.  Pulmonary:     Effort: Pulmonary effort is normal.     Breath sounds: Normal breath sounds. No wheezing.  Abdominal:     Palpations: Abdomen is soft.     Tenderness: There is abdominal tenderness.  Musculoskeletal:     Cervical back: No rigidity.  Skin:    General: Skin is warm and dry.  Findings: No rash.  Neurological:     Mental Status: He is alert.     ED Results / Procedures / Treatments   Labs (all labs ordered are listed, but only abnormal results are displayed) Labs Reviewed  RESP PANEL BY RT-PCR (RSV, FLU A&B, COVID)  [id number]  CULTURE, BLOOD (ROUTINE X 2)  CULTURE, BLOOD (ROUTINE X 2)  LACTIC ACID, PLASMA  LACTIC ACID, PLASMA  COMPREHENSIVE METABOLIC PANEL  CBC WITH DIFFERENTIAL/PLATELET  PROTIME-INR  APTT  URINALYSIS, W/ REFLEX TO CULTURE (INFECTION SUSPECTED)  LIPASE, BLOOD  AMMONIA  MAGNESIUM    EKG None  Radiology No results found.  Procedures .Critical Care  Performed by: Pricilla Loveless, MD Authorized by: Pricilla Loveless, MD   Critical care provider statement:     Critical care time (minutes):  35   Critical care time was exclusive of:  Separately billable procedures and treating other patients   Critical care was necessary to treat or prevent imminent or life-threatening deterioration of the following conditions:  Sepsis   Critical care was time spent personally by me on the following activities:  Development of treatment plan with patient or surrogate, discussions with consultants, evaluation of patient's response to treatment, examination of patient, ordering and review of laboratory studies, ordering and review of radiographic studies, ordering and performing treatments and interventions, pulse oximetry, re-evaluation of patient's condition and review of old charts     Medications Ordered in ED Medications  ceFEPIme (MAXIPIME) 2 g in sodium chloride 0.9 % 100 mL IVPB (has no administration in time range)  metroNIDAZOLE (FLAGYL) IVPB 500 mg (has no administration in time range)  acetaminophen (TYLENOL) tablet 500 mg (has no administration in time range)  vancomycin (VANCOREADY) IVPB 1750 mg/350 mL (has no administration in time range)    Followed by  vancomycin (VANCOREADY) IVPB 1500 mg/300 mL (has no administration in time range)    ED Course/ Medical Decision Making/ A&P                                 Medical Decision Making Amount and/or Complexity of Data Reviewed Labs: ordered.    Details: Lactate 4.  WBC normal.  COVID testing negative. Radiology: ordered and independent interpretation performed.    Details: Some vascular congestion. ECG/medicine tests: ordered and independent interpretation performed.    Details: A-fib.  Risk OTC drugs. Prescription drug management.   Patient was treated for sepsis as he meets SIRS criteria.  Unclear source at this time.  Given Tylenol for fever and some gentle fluids as he does seem to have some fluid overload as well.  Was given broad IV antibiotics.  He has not altered and shows no  sign/symptoms of meningismus. CT abd pelvis pending. Care transferred to Dr. Estell Harpin.         Final Clinical Impression(s) / ED Diagnoses Final diagnoses:  None    Rx / DC Orders ED Discharge Orders     None         Pricilla Loveless, MD 04/22/23 1531

## 2023-04-22 NOTE — Assessment & Plan Note (Signed)
Admission glucose is 115 mg/dl Plan to hold on insulin therapy due to risk of hypoglycemia.

## 2023-04-22 NOTE — Progress Notes (Addendum)
Pharmacy Antibiotic Note  Jose Lawrence is a 82 y.o. male admitted on 04/22/2023 with  unknown source .  Pharmacy has been consulted for Vancomycin and cefepime dosing.  Plan: Vancomycin 1750mg  IV loading dose, then 1500 mg IV Q 24 hrs. Goal AUC 400-550. Expected AUC: 481 SCr used: 1.1  Cefepime 2gm IV q8h F/U cxs and clinical progress Monitor V/S, labs and levels as indicated  Weight: 90 kg (198 lb 6.6 oz)  Temp (24hrs), Avg:101.6 F (38.7 C), Min:101.6 F (38.7 C), Max:101.6 F (38.7 C)  No results for input(s): "WBC", "CREATININE", "LATICACIDVEN", "VANCOTROUGH", "VANCOPEAK", "VANCORANDOM", "GENTTROUGH", "GENTPEAK", "GENTRANDOM", "TOBRATROUGH", "TOBRAPEAK", "TOBRARND", "AMIKACINPEAK", "AMIKACINTROU", "AMIKACIN" in the last 168 hours.  Estimated Creatinine Clearance: 58.3 mL/min (by C-G formula based on SCr of 1.11 mg/dL).    No Known Allergies  Antimicrobials this admission: Cefepime 9/13 >> Vancomycin 9/13 >>   Microbiology results: 9/13 BCx: pending 9/13 UCx: pending   MRSA PCR:   Thank you for allowing pharmacy to be a part of this patient's care.  Elder Cyphers, BS Pharm D, BCPS Clinical Pharmacist 04/22/2023 1:19 PM

## 2023-04-22 NOTE — Assessment & Plan Note (Signed)
Elevated AST in relation to ALT suggesting alcohol related.  Positive thrombocytopenia. Clinically with no significant ascites.  No peritoneal signs.   Plan to continue supportive medical therapy with diuretics. His ammonia is elevated but he has no clinical sings of encephalopathy.

## 2023-04-22 NOTE — Assessment & Plan Note (Signed)
Continue with levothyroxine  

## 2023-04-22 NOTE — Assessment & Plan Note (Addendum)
[place] patient has BPH.   Plan to placed foley catheter in place to drain urinary bladder. Start patient on empiric antibiotic therapy for urinary tract infection (present on admission). Hold on IV fluids for now, Ok to resume oral diuretic therapy. Continue with tamsulosin.  Follow up on cultures, cell count and temperature curve.  Plan to discharge home when more stable with foley catheter in place to prevent recurrent urinary retention.  Patient will need outpatient follow up with Urology.

## 2023-04-22 NOTE — ED Triage Notes (Addendum)
C/o n/v, cough, and fever 102 at home that started today.  Family reports shaking for 3 days prior.  Denies abd pain.  Pt reports ostomy bag with normal output.

## 2023-04-22 NOTE — Sepsis Progress Note (Signed)
Notified provider of need to order fluid bolus as lactic came back at 4.1 .

## 2023-04-22 NOTE — ED Notes (Signed)
Pt back from ct. No changes. Nad.

## 2023-04-23 DIAGNOSIS — Z79899 Other long term (current) drug therapy: Secondary | ICD-10-CM | POA: Diagnosis not present

## 2023-04-23 DIAGNOSIS — C184 Malignant neoplasm of transverse colon: Secondary | ICD-10-CM | POA: Diagnosis present

## 2023-04-23 DIAGNOSIS — R6521 Severe sepsis with septic shock: Secondary | ICD-10-CM | POA: Diagnosis not present

## 2023-04-23 DIAGNOSIS — Z87891 Personal history of nicotine dependence: Secondary | ICD-10-CM | POA: Diagnosis not present

## 2023-04-23 DIAGNOSIS — R7881 Bacteremia: Secondary | ICD-10-CM

## 2023-04-23 DIAGNOSIS — B955 Unspecified streptococcus as the cause of diseases classified elsewhere: Secondary | ICD-10-CM | POA: Diagnosis not present

## 2023-04-23 DIAGNOSIS — Z933 Colostomy status: Secondary | ICD-10-CM | POA: Diagnosis not present

## 2023-04-23 DIAGNOSIS — A409 Streptococcal sepsis, unspecified: Secondary | ICD-10-CM | POA: Diagnosis present

## 2023-04-23 DIAGNOSIS — Z85828 Personal history of other malignant neoplasm of skin: Secondary | ICD-10-CM | POA: Diagnosis not present

## 2023-04-23 DIAGNOSIS — N1330 Unspecified hydronephrosis: Secondary | ICD-10-CM | POA: Diagnosis not present

## 2023-04-23 DIAGNOSIS — I10 Essential (primary) hypertension: Secondary | ICD-10-CM | POA: Diagnosis present

## 2023-04-23 DIAGNOSIS — K7460 Unspecified cirrhosis of liver: Secondary | ICD-10-CM | POA: Diagnosis present

## 2023-04-23 DIAGNOSIS — I482 Chronic atrial fibrillation, unspecified: Secondary | ICD-10-CM | POA: Diagnosis present

## 2023-04-23 DIAGNOSIS — R339 Retention of urine, unspecified: Secondary | ICD-10-CM | POA: Diagnosis present

## 2023-04-23 DIAGNOSIS — B954 Other streptococcus as the cause of diseases classified elsewhere: Secondary | ICD-10-CM | POA: Diagnosis not present

## 2023-04-23 DIAGNOSIS — E119 Type 2 diabetes mellitus without complications: Secondary | ICD-10-CM | POA: Diagnosis present

## 2023-04-23 DIAGNOSIS — Z79890 Hormone replacement therapy (postmenopausal): Secondary | ICD-10-CM | POA: Diagnosis not present

## 2023-04-23 DIAGNOSIS — K7030 Alcoholic cirrhosis of liver without ascites: Secondary | ICD-10-CM | POA: Diagnosis not present

## 2023-04-23 DIAGNOSIS — Z1152 Encounter for screening for COVID-19: Secondary | ICD-10-CM | POA: Diagnosis not present

## 2023-04-23 DIAGNOSIS — E039 Hypothyroidism, unspecified: Secondary | ICD-10-CM | POA: Diagnosis present

## 2023-04-23 DIAGNOSIS — R3915 Urgency of urination: Secondary | ICD-10-CM | POA: Diagnosis present

## 2023-04-23 DIAGNOSIS — R338 Other retention of urine: Secondary | ICD-10-CM | POA: Diagnosis present

## 2023-04-23 DIAGNOSIS — Z85038 Personal history of other malignant neoplasm of large intestine: Secondary | ICD-10-CM | POA: Diagnosis not present

## 2023-04-23 DIAGNOSIS — N401 Enlarged prostate with lower urinary tract symptoms: Secondary | ICD-10-CM | POA: Diagnosis present

## 2023-04-23 DIAGNOSIS — N136 Pyonephrosis: Secondary | ICD-10-CM | POA: Diagnosis not present

## 2023-04-23 DIAGNOSIS — D696 Thrombocytopenia, unspecified: Secondary | ICD-10-CM | POA: Diagnosis not present

## 2023-04-23 DIAGNOSIS — R053 Chronic cough: Secondary | ICD-10-CM | POA: Diagnosis present

## 2023-04-23 DIAGNOSIS — Z86711 Personal history of pulmonary embolism: Secondary | ICD-10-CM | POA: Diagnosis not present

## 2023-04-23 LAB — BLOOD CULTURE ID PANEL (REFLEXED) - BCID2

## 2023-04-23 LAB — MAGNESIUM: Magnesium: 2.1 mg/dL (ref 1.7–2.4)

## 2023-04-23 LAB — CBC
HCT: 26 % — ABNORMAL LOW (ref 39.0–52.0)
Hemoglobin: 9 g/dL — ABNORMAL LOW (ref 13.0–17.0)
MCH: 37.5 pg — ABNORMAL HIGH (ref 26.0–34.0)
MCHC: 34.6 g/dL (ref 30.0–36.0)
MCV: 108.3 fL — ABNORMAL HIGH (ref 80.0–100.0)
Platelets: 50 10*3/uL — ABNORMAL LOW (ref 150–400)
RBC: 2.4 MIL/uL — ABNORMAL LOW (ref 4.22–5.81)
RDW: 15.1 % (ref 11.5–15.5)
WBC: 14.6 10*3/uL — ABNORMAL HIGH (ref 4.0–10.5)
nRBC: 0 % (ref 0.0–0.2)

## 2023-04-23 LAB — BASIC METABOLIC PANEL
Anion gap: 8 (ref 5–15)
BUN: 18 mg/dL (ref 8–23)
CO2: 20 mmol/L — ABNORMAL LOW (ref 22–32)
Calcium: 7.7 mg/dL — ABNORMAL LOW (ref 8.9–10.3)
Chloride: 103 mmol/L (ref 98–111)
Creatinine, Ser: 1.48 mg/dL — ABNORMAL HIGH (ref 0.61–1.24)
GFR, Estimated: 48 mL/min — ABNORMAL LOW (ref >60)
Glucose, Bld: 125 mg/dL — ABNORMAL HIGH (ref 70–99)
Potassium: 4.7 mmol/L (ref 3.5–5.1)
Sodium: 131 mmol/L — ABNORMAL LOW (ref 135–145)

## 2023-04-23 MED ORDER — CHLORHEXIDINE GLUCONATE CLOTH 2 % EX PADS
6.0000 | MEDICATED_PAD | Freq: Every day | CUTANEOUS | Status: DC
  Administered 2023-04-24: 6 via TOPICAL

## 2023-04-23 MED ORDER — SODIUM CHLORIDE 0.9 % IV SOLN
2.0000 g | INTRAVENOUS | Status: DC
  Administered 2023-04-23: 2 g via INTRAVENOUS
  Filled 2023-04-23: qty 20

## 2023-04-23 NOTE — TOC CM/SW Note (Signed)
Transition of Care [facility]) - Inpatient Brief Assessment   Patient Details  Name: Jose Lawrence MRN: [id number] Date of Birth: November 13, 1940  Transition of Care [facility]) CM/SW Contact:    Villa Herb, LCSWA Phone Number: 04/23/2023, 12:48 PM   Clinical Narrative: Transition of Care Department [facility]) has reviewed patient and no TOC needs have been identified at this time. We will continue to monitor patient advancement through interdisciplinary progression rounds. If new patient transition needs arise, please place a TOC consult.  Transition of Care Asessment:   Patient has primary care physician: Yes Home environment has been reviewed: from home Prior level of function:: independent Prior/Current Home Services: No current home services Social Determinants of Health Reivew: SDOH reviewed no interventions necessary Readmission risk has been reviewed: Yes Transition of care needs: no transition of care needs at this time

## 2023-04-23 NOTE — Hospital Course (Signed)
82 y.o. male with medical history significant of BPH, hypothyroidism, pulmonary embolism, T2DM, colon cancer (sp resection/ colostomy), and cirrhosis who presented with shaking and chills. He has been having urinary urgency and difficulty urinating for several months.  Pt admitted with fever, rigors, acute urinary retention, UTI and foley was placed for bladder decompression as he was noted to have bilateral hydroureteronephrosis.

## 2023-04-23 NOTE — Progress Notes (Signed)
PROGRESS NOTE   Jose Lawrence  XLK:[id number] DOB: Aug 18, 1940 DOA: 04/22/2023 PCP: Ignatius Specking, MD   Chief Complaint  Patient presents with   Fever   Vomiting   Level of care: Med-Surg  Brief Admission History:  82 y.o. male with medical history significant of BPH, hypothyroidism, pulmonary embolism, T2DM, colon cancer (sp resection/ colostomy), and cirrhosis who presented with shaking and chills. He has been having urinary urgency and difficulty urinating for several months.  Pt admitted with fever, rigors, acute urinary retention, UTI and foley was placed for bladder decompression as he was noted to have bilateral hydroureteronephrosis.    Assessment and Plan:  Acute Urinary retention with hydroureteronephrosis  Secondary to BPH S/p foley catheter in place to drain urinary bladder.  Obtain US renal to follow up for improvement of hydronephrosis  Continue with tamsulosin.  Follow up on cultures, cell count and temperature curve.  Plan to discharge home when more stable with foley catheter in place to prevent recurrent urinary retention.  Patient will need outpatient follow up with Urology.  Streptococcus agalactiae bacteremia  Increased ceftriaxone dose to 2 gm IV daily  Follow up sensitivities  Continue supportive measures   Leukocytosis  - secondary to bacteremia and acute urinary retention and foley placement - recheck CBC/diff in AM   Hypomagnesemia Repleted   Cirrhosis of liver  Elevated AST in relation to ALT suggesting alcohol related.  Positive thrombocytopenia. Clinically with no significant ascites.  continue supportive medical therapy with diuretics. His ammonia is elevated but he has no clinical sings of encephalopathy.   Cancer of transverse colon  Outpatient follow up with Oncology. Last follow up with no recurrence.   Atrial fibrillation, chronic Patient on on AV blocking agents and not on anticoagulation. Currently he is in atrial fibrillation.    Controlled type 2 diabetes mellitus without complication  Diet controlled at this time  HTN (hypertension) Hold on antihypertensive agents for now. Continue close blood pressure monitoring.   Hypothyroidism Continue with levothyroxine.   DVT prophylaxis: SCDs Code Status: Full  Family Communication: wife at bedside  Disposition: possible DC home 9/15    Consultants:   Procedures:   Antimicrobials:  Cefepime 9/13 Ceftriaxone 9/13>>   Subjective: Pt concerned about blood he is seeing in the urinary catheter  Objective: Vitals:   04/22/23 1800 04/22/23 1839 04/22/23 2141 04/23/23 0421  BP: (!) 142/53 127/63 (!) 110/54 (!) 99/46  Pulse: 82 93 75 71  Resp: 18 18  18   Temp:  98.5 F (36.9 C) 98.6 F (37 C) 97.6 F (36.4 C)  TempSrc:  Oral Oral Oral  SpO2: 97% 98% 98% 96%  Weight:  89.5 kg    Height:  6' (1.829 m)      Intake/Output Summary (Last 24 hours) at 04/23/2023 1653 Last data filed at 04/23/2023 1000 Gross per 24 hour  Intake 1325.23 ml  Output 1550 ml  Net -224.77 ml   Filed Weights   04/22/23 1235 04/22/23 1330 04/22/23 1839  Weight: 90 kg 90 kg 89.5 kg   Examination:  General exam: Appears calm and comfortable  Respiratory system: Clear to auscultation. Respiratory effort normal. Cardiovascular system: normal S1 & S2 heard. No JVD, murmurs, rubs, gallops or clicks. No pedal edema. Gastrointestinal system: Abdomen is nondistended, soft and nontender. No organomegaly or masses felt. Normal bowel sounds heard. Central nervous system: Alert and oriented. No focal neurological deficits. Extremities: Symmetric 5 x 5 power. Skin: No rashes, lesions or ulcers. Psychiatry: Judgement  and insight appear normal. Mood & affect appropriate.   Data Reviewed: I have personally reviewed following labs and imaging studies  CBC: Recent Labs  Lab 04/22/23 1326 04/23/23 0514  WBC 4.7 14.6*  NEUTROABS 4.4  --   HGB 11.9* 9.0*  HCT 34.7* 26.0*  MCV 107.8*  108.3*  PLT 61* 50*    Basic Metabolic Panel: Recent Labs  Lab 04/22/23 1326 04/23/23 0514  NA 135 131*  K 4.0 4.7  CL 103 103  CO2 22 20*  GLUCOSE 115* 125*  BUN 12 18  CREATININE 1.26* 1.48*  CALCIUM 8.8* 7.7*  MG 1.6* 2.1    CBG: No results for input(s): "GLUCAP" in the last 168 hours.  Recent Results (from the past 240 hour(s))  Blood Culture (routine x 2)     Status: None (Preliminary result)   Collection Time: 04/22/23  1:12 PM   Specimen: BLOOD  Result Value Ref Range Status   Specimen Description BLOOD LEFT WRIST  Final   Special Requests   Final    BOTTLES DRAWN AEROBIC AND ANAEROBIC Blood Culture results may not be optimal due to an excessive volume of blood received in culture bottles   Culture  Setup Time   Final    GRAM POSITIVE COCCI Gram Stain Report Called to,Read Back By and Verified With: EVANS,T @ 0231 ON 04/23/23 BY JUW IN BOTH AEROBIC AND ANAEROBIC BOTTLES GS DONE @ [facility] Performed at [facility], [address], [place], [place] [address]    Culture GRAM POSITIVE COCCI  Final   Report Status PENDING  Incomplete  Blood Culture (routine x 2)     Status: None (Preliminary result)   Collection Time: 04/22/23  1:26 PM   Specimen: BLOOD  Result Value Ref Range Status   Specimen Description   Final    BLOOD RIGHT ASSIST CONTROL Performed at [facility], [address] [place], [place] [address]    Special Requests   Final    BOTTLES DRAWN AEROBIC AND ANAEROBIC Blood Culture results may not be optimal due to an excessive volume of blood received in culture bottles Performed at [facility], [address] [place], [place] [address]    Culture  Setup Time   Final    GRAM POSITIVE COCCI Gram Stain Report Called to,Read Back By and Verified With: EVANS,T @0231  ON 04/23/23 BY JUW IN BOTH AEROBIC AND ANAEROBIC BOTTLES GS DONE @ [facility] Organism ID to follow CRITICAL RESULT CALLED TO, READ BACK BY AND VERIFIED WITH: C SIMPSON,PHARMD@0630  04/23/23  MK Performed at [facility], [address] [place], [place] [address]    Culture GRAM POSITIVE COCCI  Final   Report Status PENDING  Incomplete  Blood Culture ID Panel (Reflexed)     Status: Abnormal   Collection Time: 04/22/23  1:26 PM  Result Value Ref Range Status   Enterococcus faecalis NOT DETECTED NOT DETECTED Final   Enterococcus Faecium NOT DETECTED NOT DETECTED Final   Listeria monocytogenes NOT DETECTED NOT DETECTED Final   Staphylococcus species NOT DETECTED NOT DETECTED Final   Staphylococcus aureus (BCID) NOT DETECTED NOT DETECTED Final   Staphylococcus epidermidis NOT DETECTED NOT DETECTED Final   Staphylococcus lugdunensis NOT DETECTED NOT DETECTED Final   Streptococcus species DETECTED (A) NOT DETECTED Final    Comment: CRITICAL RESULT CALLED TO, READ BACK BY AND VERIFIED WITH: C SIMPSON,RN@0630  04/23/23 MK    Streptococcus agalactiae DETECTED (A) NOT DETECTED Final    Comment: CRITICAL RESULT CALLED TO,  READ BACK BY AND VERIFIED WITH: C SIMPSON,RN@0630  04/23/23 MK    Streptococcus pneumoniae NOT DETECTED NOT DETECTED Final   Streptococcus pyogenes NOT DETECTED NOT DETECTED Final   A.calcoaceticus-baumannii NOT DETECTED NOT DETECTED Final   Bacteroides fragilis NOT DETECTED NOT DETECTED Final   Enterobacterales NOT DETECTED NOT DETECTED Final   Enterobacter cloacae complex NOT DETECTED NOT DETECTED Final   Escherichia coli NOT DETECTED NOT DETECTED Final   Klebsiella aerogenes NOT DETECTED NOT DETECTED Final   Klebsiella oxytoca NOT DETECTED NOT DETECTED Final   Klebsiella pneumoniae NOT DETECTED NOT DETECTED Final   Proteus species NOT DETECTED NOT DETECTED Final   Salmonella species NOT DETECTED NOT DETECTED Final   Serratia marcescens NOT DETECTED NOT DETECTED Final   Haemophilus influenzae NOT DETECTED NOT DETECTED Final   Neisseria meningitidis NOT DETECTED NOT DETECTED Final   Pseudomonas aeruginosa NOT DETECTED NOT DETECTED Final    Stenotrophomonas maltophilia NOT DETECTED NOT DETECTED Final   Candida albicans NOT DETECTED NOT DETECTED Final   Candida auris NOT DETECTED NOT DETECTED Final   Candida glabrata NOT DETECTED NOT DETECTED Final   Candida krusei NOT DETECTED NOT DETECTED Final   Candida parapsilosis NOT DETECTED NOT DETECTED Final   Candida tropicalis NOT DETECTED NOT DETECTED Final   Cryptococcus neoformans/gattii NOT DETECTED NOT DETECTED Final    Comment: Performed at [facility], [address], [place], [place] [address]  Resp panel by RT-PCR (RSV, Flu A&B, Covid) Anterior Nasal Swab     Status: None   Collection Time: 04/22/23  1:28 PM   Specimen: Anterior Nasal Swab  Result Value Ref Range Status   SARS Coronavirus 2 by RT PCR NEGATIVE NEGATIVE Final    Comment: (NOTE) SARS-CoV-2 target nucleic acids are NOT DETECTED.  The SARS-CoV-2 RNA is generally detectable in upper respiratory specimens during the acute phase of infection. The lowest concentration of SARS-CoV-2 viral copies this assay can detect is 138 copies/mL. A negative result does not preclude SARS-Cov-2 infection and should not be used as the sole basis for treatment or other patient management decisions. A negative result may occur with  improper specimen collection/handling, submission of specimen other than nasopharyngeal swab, presence of viral mutation(s) within the areas targeted by this assay, and inadequate number of viral copies(<138 copies/mL). A negative result must be combined with clinical observations, patient history, and epidemiological information. The expected result is Negative.  Fact Sheet for Patients:  BloggerCourse.com  Fact Sheet for Healthcare Providers:  SeriousBroker.it  This test is no t yet approved or cleared by the [place] FDA and  has been authorized for detection and/or diagnosis of SARS-CoV-2 by FDA under an Emergency Use  Authorization (EUA). This EUA will remain  in effect (meaning this test can be used) for the duration of the COVID-19 declaration under Section 564(b)(1) of the Act, 21 U.S.C.section 360bbb-3(b)(1), unless the authorization is terminated  or revoked sooner.       Influenza A by PCR NEGATIVE NEGATIVE Final   Influenza B by PCR NEGATIVE NEGATIVE Final    Comment: (NOTE) The Xpert Xpress SARS-CoV-2/FLU/RSV plus assay is intended as an aid in the diagnosis of influenza from Nasopharyngeal swab specimens and should not be used as a sole basis for treatment. Nasal washings and aspirates are unacceptable for Xpert Xpress SARS-CoV-2/FLU/RSV testing.  Fact Sheet for Patients: BloggerCourse.com  Fact Sheet for Healthcare Providers: SeriousBroker.it  This test is not yet approved or cleared by the [place] FDA and has  been authorized for detection and/or diagnosis of SARS-CoV-2 by FDA under an Emergency Use Authorization (EUA). This EUA will remain in effect (meaning this test can be used) for the duration of the COVID-19 declaration under Section 564(b)(1) of the Act, 21 U.S.C. section 360bbb-3(b)(1), unless the authorization is terminated or revoked.     Resp Syncytial Virus by PCR NEGATIVE NEGATIVE Final    Comment: (NOTE) Fact Sheet for Patients: BloggerCourse.com  Fact Sheet for Healthcare Providers: SeriousBroker.it  This test is not yet approved or cleared by the [place] FDA and has been authorized for detection and/or diagnosis of SARS-CoV-2 by FDA under an Emergency Use Authorization (EUA). This EUA will remain in effect (meaning this test can be used) for the duration of the COVID-19 declaration under Section 564(b)(1) of the Act, 21 U.S.C. section 360bbb-3(b)(1), unless the authorization is terminated or revoked.  Performed at [facility], [address]  [place], [place] [address]      Radiology Studies: CT ABDOMEN PELVIS W CONTRAST  Result Date: 04/22/2023 CLINICAL DATA:  History of cirrhosis with nausea vomiting and fever EXAM: CT ABDOMEN AND PELVIS WITH CONTRAST TECHNIQUE: Multidetector CT imaging of the abdomen and pelvis was performed using the standard protocol following bolus administration of intravenous contrast. RADIATION DOSE REDUCTION: This exam was performed according to the departmental dose-optimization program which includes automated exposure control, adjustment of the mA and/or kV according to patient size and/or use of iterative reconstruction technique. CONTRAST:  OMNIPAQUE IOHEXOL 300 MG/ML  SOLN COMPARISON:  CT abdomen and pelvis dated 04/04/2023 FINDINGS: Lower chest: No focal consolidation or pulmonary nodule in the lung bases. Similar moderate bilateral pleural effusions. Partially imaged heart size is normal. Coronary artery calcifications. Hepatobiliary: Cirrhotic morphology. No intra or extrahepatic biliary ductal dilation. Cholecystectomy. Pancreas: No focal lesions or main ductal dilation. Spleen: Normal in size without focal abnormality. Adrenals/Urinary Tract: No adrenal nodules. New bilateral hydroureteronephrosis to the level of the distended urinary bladder. No calculi. No suspicious renal masses. Increased distention of the urinary bladder with mild circumferential mural thickening and focal lesion along the posterior left measuring 1.1 x 0.7 cm (2:67), unchanged. Stomach/Bowel: Normal appearance of the stomach. 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 No abnormal bowel dilation. Diffuse small bowel mural thickening, likely portal enteropathy. Vascular/Lymphatic: Aortic atherosclerosis. No enlarged abdominal or pelvic lymph nodes. Reproductive: Prostate is unremarkable. Other: Postsurgical changes of the rectal region. Similar small volume ascites. Musculoskeletal: No acute or abnormal lytic or blastic  osseous lesions. Multilevel degenerative changes of the partially imaged thoracic and lumbar spine. Mild body wall edema. IMPRESSION: 1. New bilateral hydroureteronephrosis to the level of the urinary bladder, which demonstrates interval increased distention with mild circumferential mural thickening. Recommend correlation with urinalysis. 2. Unchanged focal lesion along the posterior left bladder measuring 1.1 x 0.7 cm. Cystoscopic evaluation is again recommended. 3. Cirrhotic morphology with small volume ascites and portal enteropathy. 4. Similar moderate bilateral pleural effusions. 5. Aortic Atherosclerosis (ICD10-I70.0). Coronary artery calcifications. Assessment for potential risk factor modification, dietary therapy or pharmacologic therapy may be warranted, if clinically indicated. Electronically Signed   By: Agustin Cree M.D.   On: 04/22/2023 15:54   DG Chest Port 1 View  Result Date: 04/22/2023 CLINICAL DATA:  Fever and cough EXAM: PORTABLE CHEST 1 VIEW COMPARISON:  X-ray 11/02/2022 FINDINGS: Enlarged cardiopericardial silhouette with some vascular congestion. Tiny right effusion. No pneumothorax. No consolidation. Overlapping cardiac leads. Left-sided chest port. Tip along the central SVC. Overlapping cardiac leads. Films  are under penetrated. Degenerative changes. IMPRESSION: Enlarged heart with increasing vascular congestion. Tiny right effusion. Chest port Electronically Signed   By: Karen Kays M.D.   On: 04/22/2023 15:13    Scheduled Meds:  enoxaparin (LOVENOX) injection  40 mg Subcutaneous Q24H   feeding supplement  237 mL Oral BID BM   furosemide  40 mg Oral Daily   levothyroxine  50 mcg Oral QAC breakfast   tamsulosin  0.4 mg Oral QPC supper   Continuous Infusions:  cefTRIAXone (ROCEPHIN)  IV       LOS: 0 days   Time spent: 55 mins  Zara Wendt Laural Benes, MD How to contact the [facility] Attending or Consulting provider 7A - 7P or covering provider during after hours 7P -7A, for this  patient?  Check the care team in [facility] and look for a) attending/consulting [facility] provider listed and b) the [facility] team listed Log into [email or web address] and use [place]'s universal password to access. If you do not have the password, please contact the hospital operator. Locate the [facility] provider you are looking for under [facility] and page to a number that you can be directly reached. If you still have difficulty reaching the provider, please page the [facility] (Director on Call) for the Hospitalists listed on amion for assistance.  04/23/2023, 4:53 PM

## 2023-04-23 NOTE — Progress Notes (Signed)
   04/23/23 0233  Provider Notification  Provider Name/Title Dr. Victorino Dike  Date Provider Notified 04/23/23  Time Provider Notified [phone]  Notification Reason Critical Result  Test performed and critical result blood cultures positve gram positive cocci  Date Critical Result Received 04/23/23  Time Critical Result Received 0232  Provider response No new orders (patient already on antibiotics)  Date of Provider Response 04/23/23  Time of Provider Response [phone]

## 2023-04-24 ENCOUNTER — Inpatient Hospital Stay (HOSPITAL_COMMUNITY): Payer: Medicare Other

## 2023-04-24 DIAGNOSIS — R7881 Bacteremia: Secondary | ICD-10-CM | POA: Diagnosis not present

## 2023-04-24 DIAGNOSIS — I482 Chronic atrial fibrillation, unspecified: Secondary | ICD-10-CM

## 2023-04-24 DIAGNOSIS — B955 Unspecified streptococcus as the cause of diseases classified elsewhere: Secondary | ICD-10-CM | POA: Diagnosis not present

## 2023-04-24 DIAGNOSIS — N1330 Unspecified hydronephrosis: Secondary | ICD-10-CM | POA: Diagnosis not present

## 2023-04-24 DIAGNOSIS — R339 Retention of urine, unspecified: Secondary | ICD-10-CM | POA: Diagnosis not present

## 2023-04-24 DIAGNOSIS — K7030 Alcoholic cirrhosis of liver without ascites: Secondary | ICD-10-CM | POA: Diagnosis not present

## 2023-04-24 MED ORDER — AMOXICILLIN 875 MG PO TABS: 875.0000 mg | ORAL_TABLET | Freq: Two times a day (BID) | ORAL | 0 refills | Status: AC

## 2023-04-24 NOTE — Progress Notes (Signed)
Pt switched to leg bag for home DC per MD. Wife in room for DC teaching and education on care for foley and cleaning. Pt does have some swelling and redness to groin which patient and wife state has improved from admission. Pt and wife verbalize d/c teaching about medications, foley care, and follow up with urology. IVs removed. Pt dressed and taken out in wheelchair. Pt is free from complaints and taken out in wheelchair.

## 2023-04-24 NOTE — Discharge Summary (Signed)
Physician Discharge Summary  Jose Lawrence JYN:[id number] DOB: 1941/04/04 DOA: 04/22/2023  PCP: Ignatius Specking, MD  Admit date: 04/22/2023 Discharge date: 04/24/2023  Admitted From:  Home  Disposition: Home   Recommendations for Outpatient Follow-up:  Follow up with PCP in 1 weeks Follow up Urology [facility] in 1-2 weeks for foley removal/voiding trial Please check BMP/CBC in 1-2 weeks to follow up renal function, lytes, WBC Please follow up on final reading of renal US done 04/24/23  Discharge Condition: STABLE   CODE STATUS: FULL DIET: resume home diet    Brief Hospitalization Summary: Please see all hospital notes, images, labs for full details of the hospitalization. Admission provider HPI:  82 y.o. male with medical history significant of BPH, hypothyroidism, pulmonary embolism, T2DM, colon cancer (sp resection/ colostomy), and cirrhosis who presented with shaking and chills. He has been having urinary urgency and difficulty urinating for several months.  Pt admitted with fever, rigors, acute urinary retention, UTI and foley was placed for bladder decompression as he was noted to have bilateral hydroureteronephrosis.   Hospital Course by problem list   Acute Urinary retention with hydroureteronephrosis  Secondary to BPH S/p foley catheter in place to drain urinary bladder.  Obtain US renal to follow up for improvement of hydronephrosis  Continue with tamsulosin.  Follow up on cultures, cell count and temperature curve.  Plan to discharge home when more stable with foley catheter in place to prevent recurrent urinary retention.  Patient will need outpatient follow up with Urology.   Streptococcus agalactiae bacteremia  Increased ceftriaxone dose to 2 gm IV daily  Pt responded well to supportive measures  Dc home on amoxicillin 875 mg BID x 7d   Leukocytosis  - secondary to bacteremia and acute urinary retention and foley placement - WBC trending down with treatments -  follow up recheck CBC outpatient with PCP/urology in 1-2 weeks recommended   Hypomagnesemia Repleted    Cirrhosis of liver  Elevated AST in relation to ALT suggesting alcohol related.  Positive thrombocytopenia. Clinically with no significant ascites.  continue supportive medical therapy with diuretics. His ammonia is elevated but he has no clinical sings of encephalopathy.    Cancer of transverse colon  Outpatient follow up with Oncology. Last follow up with no recurrence.    Atrial fibrillation, chronic Patient on on AV blocking agents and not on anticoagulation. Currently he is in atrial fibrillation.    Controlled type 2 diabetes mellitus without complication  Diet controlled at this time   HTN (hypertension) Hold on antihypertensive agents for now. Continue close blood pressure monitoring.    Hypothyroidism Continue with levothyroxine.    Discharge Diagnoses:  Principal Problem:   Urinary retention Active Problems:   Hypothyroidism   HTN (hypertension)   Controlled type 2 diabetes mellitus without complication (HCC)   Atrial fibrillation, chronic (HCC)   Cancer of transverse colon (HCC)   Cirrhosis of liver (HCC)   Hypomagnesemia   Streptococcal bacteremia   Discharge Instructions: Discharge Instructions     Ambulatory referral to Urology   Complete by: As directed    Hospital follow up for foley removal and voiding trial      Allergies as of 04/24/2023   No Known Allergies      Medication List     STOP taking these medications    potassium chloride 10 MEQ tablet Commonly known as: KLOR-CON       TAKE these medications    acetaminophen 325 MG suppository Commonly known as:  TYLENOL Place 325 mg rectally every 4 (four) hours as needed for moderate pain.   amoxicillin 875 MG tablet Commonly known as: AMOXIL Take 1 tablet (875 mg total) by mouth 2 (two) times daily for 7 days.   furosemide 40 MG tablet Commonly known as: LASIX Take  20-40 mg by mouth daily as needed.   lactose free nutrition Liqd Take 237 mLs by mouth daily at 6 (six) AM.   levothyroxine 50 MCG tablet Commonly known as: SYNTHROID Take 50 mcg by mouth daily before breakfast.   loperamide 2 MG capsule Commonly known as: IMODIUM Take 2 capsules (4 mg total) by mouth 4 (four) times daily -  before meals and at bedtime.   OneTouch Verio test strip Generic drug: glucose blood 1 each 2 (two) times daily.   tamsulosin 0.4 MG Caps capsule Commonly known as: FLOMAX Take 1 capsule (0.4 mg total) by mouth daily after supper.   Vitamin D (Ergocalciferol) 1.25 MG (50000 UNIT) Caps capsule Commonly known as: DRISDOL Take 1 capsule by mouth once a week What changed: Another medication with the same name was removed. Continue taking this medication, and follow the directions you see here.        Follow-up Information     Vyas, Dhruv B, MD. Schedule an appointment as soon as possible for a visit in 1 week(s).   Specialty: Internal Medicine Why: Hospital Follow Up Contact information: [address] [place] [address] [phone]         [facility]. Schedule an appointment as soon as possible for a visit in 1 week(s).   Why: Hospital Follow Up for foley removal and voiding trial Contact information: [address] [place] [address] [phone]               No Known Allergies Allergies as of 04/24/2023   No Known Allergies      Medication List     STOP taking these medications    potassium chloride 10 MEQ tablet Commonly known as: KLOR-CON       TAKE these medications    acetaminophen 325 MG suppository Commonly known as: TYLENOL Place 325 mg rectally every 4 (four) hours as needed for moderate pain.   amoxicillin 875 MG tablet Commonly known as: AMOXIL Take 1 tablet (875 mg total) by mouth 2 (two) times daily for 7 days.   furosemide 40 MG tablet Commonly  known as: LASIX Take 20-40 mg by mouth daily as needed.   lactose free nutrition Liqd Take 237 mLs by mouth daily at 6 (six) AM.   levothyroxine 50 MCG tablet Commonly known as: SYNTHROID Take 50 mcg by mouth daily before breakfast.   loperamide 2 MG capsule Commonly known as: IMODIUM Take 2 capsules (4 mg total) by mouth 4 (four) times daily -  before meals and at bedtime.   OneTouch Verio test strip Generic drug: glucose blood 1 each 2 (two) times daily.   tamsulosin 0.4 MG Caps capsule Commonly known as: FLOMAX Take 1 capsule (0.4 mg total) by mouth daily after supper.   Vitamin D (Ergocalciferol) 1.25 MG (50000 UNIT) Caps capsule Commonly known as: DRISDOL Take 1 capsule by mouth once a week What changed: Another medication with the same name was removed. Continue taking this medication, and follow the directions you see here.        Procedures/Studies: CT ABDOMEN PELVIS W CONTRAST  Result Date: 04/22/2023 CLINICAL DATA:  History of cirrhosis with  nausea vomiting and fever EXAM: CT ABDOMEN AND PELVIS WITH CONTRAST TECHNIQUE: Multidetector CT imaging of the abdomen and pelvis was performed using the standard protocol following bolus administration of intravenous contrast. RADIATION DOSE REDUCTION: This exam was performed according to the departmental dose-optimization program which includes automated exposure control, adjustment of the mA and/or kV according to patient size and/or use of iterative reconstruction technique. CONTRAST:  OMNIPAQUE IOHEXOL 300 MG/ML  SOLN COMPARISON:  CT abdomen and pelvis dated 04/04/2023 FINDINGS: Lower chest: No focal consolidation or pulmonary nodule in the lung bases. Similar moderate bilateral pleural effusions. Partially imaged heart size is normal. Coronary artery calcifications. Hepatobiliary: Cirrhotic morphology. No intra or extrahepatic biliary ductal dilation. Cholecystectomy. Pancreas: No focal lesions or main ductal dilation.  Spleen: Normal in size without focal abnormality. Adrenals/Urinary Tract: No adrenal nodules. New bilateral hydroureteronephrosis to the level of the distended urinary bladder. No calculi. No suspicious renal masses. Increased distention of the urinary bladder with mild circumferential mural thickening and focal lesion along the posterior left measuring 1.1 x 0.7 cm (2:67), unchanged. Stomach/Bowel: Normal appearance of the stomach. 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 No abnormal bowel dilation. Diffuse small bowel mural thickening, likely portal enteropathy. Vascular/Lymphatic: Aortic atherosclerosis. No enlarged abdominal or pelvic lymph nodes. Reproductive: Prostate is unremarkable. Other: Postsurgical changes of the rectal region. Similar small volume ascites. Musculoskeletal: No acute or abnormal lytic or blastic osseous lesions. Multilevel degenerative changes of the partially imaged thoracic and lumbar spine. Mild body wall edema. IMPRESSION: 1. New bilateral hydroureteronephrosis to the level of the urinary bladder, which demonstrates interval increased distention with mild circumferential mural thickening. Recommend correlation with urinalysis. 2. Unchanged focal lesion along the posterior left bladder measuring 1.1 x 0.7 cm. Cystoscopic evaluation is again recommended. 3. Cirrhotic morphology with small volume ascites and portal enteropathy. 4. Similar moderate bilateral pleural effusions. 5. Aortic Atherosclerosis (ICD10-I70.0). Coronary artery calcifications. Assessment for potential risk factor modification, dietary therapy or pharmacologic therapy may be warranted, if clinically indicated. Electronically Signed   By: Agustin Cree M.D.   On: 04/22/2023 15:54   DG Chest Port 1 View  Result Date: 04/22/2023 CLINICAL DATA:  Fever and cough EXAM: PORTABLE CHEST 1 VIEW COMPARISON:  X-ray 11/02/2022 FINDINGS: Enlarged cardiopericardial silhouette with some vascular congestion. Tiny right  effusion. No pneumothorax. No consolidation. Overlapping cardiac leads. Left-sided chest port. Tip along the central SVC. Overlapping cardiac leads. Films are under penetrated. Degenerative changes. IMPRESSION: Enlarged heart with increasing vascular congestion. Tiny right effusion. Chest port Electronically Signed   By: Karen Kays M.D.   On: 04/22/2023 15:13   US Abdomen Limited RUQ (LIVER/GB)  Result Date: 04/19/2023 CLINICAL DATA:  Cirrhosis EXAM: ULTRASOUND ABDOMEN LIMITED RIGHT UPPER QUADRANT COMPARISON:  CT abdomen pelvis 04/04/2023 FINDINGS: Gallbladder: Surgically absent Common bile duct: Diameter: 5.5 mm Liver: Coarsened echogenicity and nodular contour. No focal hepatic lesion is identified. Portal vein is patent on color Doppler imaging with normal direction of blood flow towards the liver. Other: Moderate volume ascites. IMPRESSION: 1. Cirrhotic morphology of the liver. No focal hepatic lesion identified. 2. Moderate volume ascites. Electronically Signed   By: Annia Belt M.D.   On: 04/19/2023 11:07   CT Abdomen Pelvis W Contrast  Result Date: 04/07/2023 CLINICAL DATA:  Restaging transverse colon cancer with resection and colostomy in 2009. Prior chemotherapy and radiation therapy. History of bladder repair. * Tracking Code: BO * EXAM: CT ABDOMEN AND PELVIS WITH CONTRAST TECHNIQUE: Multidetector CT imaging of the abdomen  and pelvis was performed using the standard protocol following bolus administration of intravenous contrast. RADIATION DOSE REDUCTION: This exam was performed according to the departmental dose-optimization program which includes automated exposure control, adjustment of the mA and/or kV according to patient size and/or use of iterative reconstruction technique. CONTRAST:  OMNIPAQUE IOHEXOL 300 MG/ML  SOLN COMPARISON:  Abdominopelvic CT 03/23/2022 and 10/03/2021. FINDINGS: Lower chest: New moderate right and small left pleural effusions with associated mild compressive  atelectasis at both lung bases. Atherosclerosis of the aorta and coronary arteries. Hepatobiliary: Progressive contour irregularity of the liver and generalized contraction suspicious for cirrhosis. No focal hepatic lesion or abnormal enhancement identified. Status post cholecystectomy without evidence of biliary dilatation. Pancreas: Unremarkable. No pancreatic ductal dilatation or surrounding inflammatory changes. Spleen: Normal in size without focal abnormality. Adrenals/Urinary Tract: Both adrenal glands appear normal. No evidence of urinary tract calculus, suspicious renal lesion or hydronephrosis. Suspicion of possible small enhancing bladder lesion on the left, measuring 1.2 x 0.7 cm transverse on image 69/2 and 1.1 cm on coronal image 84/6. The bladder otherwise appears unremarkable. Stomach/Bowel: Enteric contrast has passed into the ileostomy. Stable small hiatal hernia containing fat and fluid. The stomach appears unremarkable for its degree of distention. Mild generalized small bowel wall thickening, likely related to the patient's ascites. No bowel distension or focal mucosal abnormality identified. Previous total colectomy. Grossly stable chronic scarring and calcifications in the presacral space without evidence of enlarging focal fluid collection. Vascular/Lymphatic: There are no enlarged abdominal or pelvic lymph nodes. Minimally more prominent inguinal lymph nodes are not pathologically enlarged, likely reactive. Mild iliac atherosclerosis. No evidence of aneurysm or large vessel occlusion. The portal, superior mesenteric and splenic veins are patent. Reproductive: The prostate gland and seminal vesicles appear unremarkable. Other: New generalized edema throughout the subcutaneous and intra-abdominal fat with a small to moderate amount of ascites. No peritoneal nodularity identified. No pneumoperitoneum. Musculoskeletal: No acute or significant osseous findings. Stable osseous demineralization and  mild multilevel spondylosis. Incidental synovial herniation pit noted anteriorly in the right femoral head, stable. Unless specific follow-up recommendations are mentioned in the findings or impression sections, no imaging follow-up of any mentioned incidental findings is recommended. IMPRESSION: 1. No findings suspicious for metastatic disease in the abdomen or pelvis. 2. Progressive morphologic changes in the liver suspicious for cirrhosis. No focal hepatic lesion identified. 3. New generalized edema throughout the subcutaneous and intra-abdominal fat with a small to moderate amount of ascites, bilateral pleural effusions and mild diffuse small bowel wall thickening. Findings are most likely secondary to third spacing of fluid of undetermined etiology. Correlate clinically. 4. Possible small enhancing bladder lesion on the left, suspicious for urothelial neoplasm. Recommend Urology consultation and consideration of cystoscopy. 5.  Aortic Atherosclerosis (ICD10-I70.0). Electronically Signed   By: Carey Bullocks M.D.   On: 04/07/2023 15:15     Subjective: Pt reports he feels well, no fever or chills, he feels comfortable being able to manage foley catheter at home and will follow up with urologist and with his PCP to have his labs rechecked.    Discharge Exam: Vitals:   04/24/23 0222 04/24/23 0609  BP: (!) 106/48 (!) 125/59  Pulse: (!) 55 60  Resp: 18 18  Temp: 98.7 F (37.1 C) 98.6 F (37 C)  SpO2: 95% 95%   Vitals:   04/23/23 0421 04/23/23 2216 04/24/23 0222 04/24/23 0609  BP: (!) 99/46 (!) 105/49 (!) 106/48 (!) 125/59  Pulse: 71 67 (!) 55 60  Resp: 18  20 18 18   Temp: 97.6 F (36.4 C) 99.1 F (37.3 C) 98.7 F (37.1 C) 98.6 F (37 C)  TempSrc: Oral Oral Oral Oral  SpO2: 96% 95% 95% 95%  Weight:      Height:       General: Pt is alert, awake, not in acute distress Cardiovascular: normal S1/S2 +, no rubs, no gallops Respiratory: CTA bilaterally, no wheezing, no rhonchi Abdominal:  Soft, NT, ND, bowel sounds + GU: foley catheter in place  Extremities: no edema, no cyanosis   The results of significant diagnostics from this hospitalization (including imaging, microbiology, ancillary and laboratory) are listed below for reference.     Microbiology: Recent Results (from the past 240 hour(s))  Blood Culture (routine x 2)     Status: None (Preliminary result)   Collection Time: 04/22/23  1:12 PM   Specimen: BLOOD  Result Value Ref Range Status   Specimen Description   Final    BLOOD LEFT WRIST Performed at [facility], [address], [place], [place] [address]    Special Requests   Final    BOTTLES DRAWN AEROBIC AND ANAEROBIC Blood Culture results may not be optimal due to an excessive volume of blood received in culture bottles Performed at [facility], [address], [place], [place] [address]    Culture  Setup Time   Final    GRAM POSITIVE COCCI Gram Stain Report Called to,Read Back By and Verified With: EVANS,T @ 0231 ON 04/23/23 BY JUW IN BOTH AEROBIC AND ANAEROBIC BOTTLES GS DONE @ [facility] Performed at [facility], [address] [place], [place] [address]    Culture   Final    GRAM POSITIVE COCCI IDENTIFICATION TO FOLLOW Performed at [facility], [address], [place], [place] [address]    Report Status PENDING  Incomplete  Blood Culture (routine x 2)     Status: Abnormal (Preliminary result)   Collection Time: 04/22/23  1:26 PM   Specimen: BLOOD  Result Value Ref Range Status   Specimen Description   Final    BLOOD RIGHT ASSIST CONTROL Performed at [facility], [address], [place], [place] [address]    Special Requests   Final    BOTTLES DRAWN AEROBIC AND ANAEROBIC Blood Culture results may not be optimal due to an excessive volume of blood received in culture bottles Performed at [facility], [address] [place], [place] [address]    Culture  Setup Time   Final    GRAM POSITIVE COCCI Gram Stain Report Called to,Read Back By and  Verified With: EVANS,T @0231  ON 04/23/23 BY JUW IN BOTH AEROBIC AND ANAEROBIC BOTTLES GS DONE @ [facility] CRITICAL RESULT CALLED TO, READ BACK BY AND VERIFIED WITH: C SIMPSON,PHARMD@0630  04/23/23 MK    Culture (A)  Final    GROUP B STREP(S.AGALACTIAE)ISOLATED SUSCEPTIBILITIES TO FOLLOW Performed at [facility], [address], [place], [place] [address]    Report Status PENDING  Incomplete  Blood Culture ID Panel (Reflexed)     Status: Abnormal   Collection Time: 04/22/23  1:26 PM  Result Value Ref Range Status   Enterococcus faecalis NOT DETECTED NOT DETECTED Final   Enterococcus Faecium NOT DETECTED NOT DETECTED Final   Listeria monocytogenes NOT DETECTED NOT DETECTED Final   Staphylococcus species NOT DETECTED NOT DETECTED Final   Staphylococcus aureus (BCID) NOT DETECTED NOT DETECTED Final   Staphylococcus epidermidis NOT DETECTED NOT DETECTED Final   Staphylococcus lugdunensis NOT DETECTED NOT DETECTED  Final   Streptococcus species DETECTED (A) NOT DETECTED Final    Comment: CRITICAL RESULT CALLED TO, READ BACK BY AND VERIFIED WITH: C SIMPSON,RN@0630  04/23/23 MK    Streptococcus agalactiae DETECTED (A) NOT DETECTED Final    Comment: CRITICAL RESULT CALLED TO, READ BACK BY AND VERIFIED WITH: C SIMPSON,RN@0630  04/23/23 MK    Streptococcus pneumoniae NOT DETECTED NOT DETECTED Final   Streptococcus pyogenes NOT DETECTED NOT DETECTED Final   A.calcoaceticus-baumannii NOT DETECTED NOT DETECTED Final   Bacteroides fragilis NOT DETECTED NOT DETECTED Final   Enterobacterales NOT DETECTED NOT DETECTED Final   Enterobacter cloacae complex NOT DETECTED NOT DETECTED Final   Escherichia coli NOT DETECTED NOT DETECTED Final   Klebsiella aerogenes NOT DETECTED NOT DETECTED Final   Klebsiella oxytoca NOT DETECTED NOT DETECTED Final   Klebsiella pneumoniae NOT DETECTED NOT DETECTED Final   Proteus species NOT DETECTED NOT DETECTED Final   Salmonella species NOT DETECTED NOT DETECTED Final    Serratia marcescens NOT DETECTED NOT DETECTED Final   Haemophilus influenzae NOT DETECTED NOT DETECTED Final   Neisseria meningitidis NOT DETECTED NOT DETECTED Final   Pseudomonas aeruginosa NOT DETECTED NOT DETECTED Final   Stenotrophomonas maltophilia NOT DETECTED NOT DETECTED Final   Candida albicans NOT DETECTED NOT DETECTED Final   Candida auris NOT DETECTED NOT DETECTED Final   Candida glabrata NOT DETECTED NOT DETECTED Final   Candida krusei NOT DETECTED NOT DETECTED Final   Candida parapsilosis NOT DETECTED NOT DETECTED Final   Candida tropicalis NOT DETECTED NOT DETECTED Final   Cryptococcus neoformans/gattii NOT DETECTED NOT DETECTED Final    Comment: Performed at [facility], [address] [place], [place] [address]  Resp panel by RT-PCR (RSV, Flu A&B, Covid) Anterior Nasal Swab     Status: None   Collection Time: 04/22/23  1:28 PM   Specimen: Anterior Nasal Swab  Result Value Ref Range Status   SARS Coronavirus 2 by RT PCR NEGATIVE NEGATIVE Final    Comment: (NOTE) SARS-CoV-2 target nucleic acids are NOT DETECTED.  The SARS-CoV-2 RNA is generally detectable in upper respiratory specimens during the acute phase of infection. The lowest concentration of SARS-CoV-2 viral copies this assay can detect is 138 copies/mL. A negative result does not preclude SARS-Cov-2 infection and should not be used as the sole basis for treatment or other patient management decisions. A negative result may occur with  improper specimen collection/handling, submission of specimen other than nasopharyngeal swab, presence of viral mutation(s) within the areas targeted by this assay, and inadequate number of viral copies(<138 copies/mL). A negative result must be combined with clinical observations, patient history, and epidemiological information. The expected result is Negative.  Fact Sheet for Patients:  BloggerCourse.com  Fact Sheet for Healthcare  Providers:  SeriousBroker.it  This test is no t yet approved or cleared by the [place] FDA and  has been authorized for detection and/or diagnosis of SARS-CoV-2 by FDA under an Emergency Use Authorization (EUA). This EUA will remain  in effect (meaning this test can be used) for the duration of the COVID-19 declaration under Section 564(b)(1) of the Act, 21 U.S.C.section 360bbb-3(b)(1), unless the authorization is terminated  or revoked sooner.       Influenza A by PCR NEGATIVE NEGATIVE Final   Influenza B by PCR NEGATIVE NEGATIVE Final    Comment: (NOTE) The Xpert Xpress SARS-CoV-2/FLU/RSV plus assay is intended as an aid in the diagnosis of influenza from Nasopharyngeal swab specimens and should not be used  as a sole basis for treatment. Nasal washings and aspirates are unacceptable for Xpert Xpress SARS-CoV-2/FLU/RSV testing.  Fact Sheet for Patients: BloggerCourse.com  Fact Sheet for Healthcare Providers: SeriousBroker.it  This test is not yet approved or cleared by the [place] FDA and has been authorized for detection and/or diagnosis of SARS-CoV-2 by FDA under an Emergency Use Authorization (EUA). This EUA will remain in effect (meaning this test can be used) for the duration of the COVID-19 declaration under Section 564(b)(1) of the Act, 21 U.S.C. section 360bbb-3(b)(1), unless the authorization is terminated or revoked.     Resp Syncytial Virus by PCR NEGATIVE NEGATIVE Final    Comment: (NOTE) Fact Sheet for Patients: BloggerCourse.com  Fact Sheet for Healthcare Providers: SeriousBroker.it  This test is not yet approved or cleared by the [place] FDA and has been authorized for detection and/or diagnosis of SARS-CoV-2 by FDA under an Emergency Use Authorization (EUA). This EUA will remain in effect (meaning this test can be  used) for the duration of the COVID-19 declaration under Section 564(b)(1) of the Act, 21 U.S.C. section 360bbb-3(b)(1), unless the authorization is terminated or revoked.  Performed at [facility], [address], [place], [place] [address]      Labs: BNP (last 3 results) No results for input(s): "BNP" in the last 8760 hours. Basic Metabolic Panel: Recent Labs  Lab 04/22/23 1326 04/23/23 0514  NA 135 131*  K 4.0 4.7  CL 103 103  CO2 22 20*  GLUCOSE 115* 125*  BUN 12 18  CREATININE 1.26* 1.48*  CALCIUM 8.8* 7.7*  MG 1.6* 2.1   Liver Function Tests: Recent Labs  Lab 04/22/23 1326  AST 70*  ALT 40  ALKPHOS 125  BILITOT 4.7*  PROT 7.0  ALBUMIN 2.8*   Recent Labs  Lab 04/22/23 1326  LIPASE 24   Recent Labs  Lab 04/22/23 1347  AMMONIA 51*   CBC: Recent Labs  Lab 04/22/23 1326 04/23/23 0514  WBC 4.7 14.6*  NEUTROABS 4.4  --   HGB 11.9* 9.0*  HCT 34.7* 26.0*  MCV 107.8* 108.3*  PLT 61* 50*   Cardiac Enzymes: No results for input(s): "CKTOTAL", "CKMB", "CKMBINDEX", "TROPONINI" in the last 168 hours. BNP: Invalid input(s): "POCBNP" CBG: No results for input(s): "GLUCAP" in the last 168 hours. D-Dimer No results for input(s): "DDIMER" in the last 72 hours. Hgb A1c No results for input(s): "HGBA1C" in the last 72 hours. Lipid Profile No results for input(s): "CHOL", "HDL", "LDLCALC", "TRIG", "CHOLHDL", "LDLDIRECT" in the last 72 hours. Thyroid function studies No results for input(s): "TSH", "T4TOTAL", "T3FREE", "THYROIDAB" in the last 72 hours.  Invalid input(s): "FREET3" Anemia work up No results for input(s): "VITAMINB12", "FOLATE", "FERRITIN", "TIBC", "IRON", "RETICCTPCT" in the last 72 hours. Urinalysis    Component Value Date/Time   COLORURINE YELLOW 04/22/2023 1440   APPEARANCEUR CLEAR 04/22/2023 1440   APPEARANCEUR Turbid (A) 09/14/2018 1514   LABSPEC 1.005 04/22/2023 1440   PHURINE 5.0 04/22/2023 1440   GLUCOSEU NEGATIVE 04/22/2023  1440   HGBUR MODERATE (A) 04/22/2023 1440   BILIRUBINUR NEGATIVE 04/22/2023 1440   BILIRUBINUR Negative 09/14/2018 1514   KETONESUR NEGATIVE 04/22/2023 1440   PROTEINUR NEGATIVE 04/22/2023 1440   NITRITE NEGATIVE 04/22/2023 1440   LEUKOCYTESUR TRACE (A) 04/22/2023 1440   Sepsis Labs Recent Labs  Lab 04/22/23 1326 04/23/23 0514  WBC 4.7 14.6*   Microbiology Recent Results (from the past 240 hour(s))  Blood Culture (routine x 2)     Status: None (  Preliminary result)   Collection Time: 04/22/23  1:12 PM   Specimen: BLOOD  Result Value Ref Range Status   Specimen Description   Final    BLOOD LEFT WRIST Performed at [facility], [address], [place], [place] [address]    Special Requests   Final    BOTTLES DRAWN AEROBIC AND ANAEROBIC Blood Culture results may not be optimal due to an excessive volume of blood received in culture bottles Performed at [facility], [address], [place], [place] [address]    Culture  Setup Time   Final    GRAM POSITIVE COCCI Gram Stain Report Called to,Read Back By and Verified With: EVANS,T @ 0231 ON 04/23/23 BY JUW IN BOTH AEROBIC AND ANAEROBIC BOTTLES GS DONE @ [facility] Performed at [facility], [address] [place], [place] [address]    Culture   Final    GRAM POSITIVE COCCI IDENTIFICATION TO FOLLOW Performed at [facility], [address], [place], [place] [address]    Report Status PENDING  Incomplete  Blood Culture (routine x 2)     Status: Abnormal (Preliminary result)   Collection Time: 04/22/23  1:26 PM   Specimen: BLOOD  Result Value Ref Range Status   Specimen Description   Final    BLOOD RIGHT ASSIST CONTROL Performed at [facility], [address], [place], [place] [address]    Special Requests   Final    BOTTLES DRAWN AEROBIC AND ANAEROBIC Blood Culture results may not be optimal due to an excessive volume of blood received in culture bottles Performed at [facility], [address] [place], [place] [address]     Culture  Setup Time   Final    GRAM POSITIVE COCCI Gram Stain Report Called to,Read Back By and Verified With: EVANS,T @0231  ON 04/23/23 BY JUW IN BOTH AEROBIC AND ANAEROBIC BOTTLES GS DONE @ [facility] CRITICAL RESULT CALLED TO, READ BACK BY AND VERIFIED WITH: C SIMPSON,PHARMD@0630  04/23/23 MK    Culture (A)  Final    GROUP B STREP(S.AGALACTIAE)ISOLATED SUSCEPTIBILITIES TO FOLLOW Performed at [facility], [address] [place], [place] [address]    Report Status PENDING  Incomplete  Blood Culture ID Panel (Reflexed)     Status: Abnormal   Collection Time: 04/22/23  1:26 PM  Result Value Ref Range Status   Enterococcus faecalis NOT DETECTED NOT DETECTED Final   Enterococcus Faecium NOT DETECTED NOT DETECTED Final   Listeria monocytogenes NOT DETECTED NOT DETECTED Final   Staphylococcus species NOT DETECTED NOT DETECTED Final   Staphylococcus aureus (BCID) NOT DETECTED NOT DETECTED Final   Staphylococcus epidermidis NOT DETECTED NOT DETECTED Final   Staphylococcus lugdunensis NOT DETECTED NOT DETECTED Final   Streptococcus species DETECTED (A) NOT DETECTED Final    Comment: CRITICAL RESULT CALLED TO, READ BACK BY AND VERIFIED WITH: C SIMPSON,RN@0630  04/23/23 MK    Streptococcus agalactiae DETECTED (A) NOT DETECTED Final    Comment: CRITICAL RESULT CALLED TO, READ BACK BY AND VERIFIED WITH: C SIMPSON,RN@0630  04/23/23 MK    Streptococcus pneumoniae NOT DETECTED NOT DETECTED Final   Streptococcus pyogenes NOT DETECTED NOT DETECTED Final   A.calcoaceticus-baumannii NOT DETECTED NOT DETECTED Final   Bacteroides fragilis NOT DETECTED NOT DETECTED Final   Enterobacterales NOT DETECTED NOT DETECTED Final   Enterobacter cloacae complex NOT DETECTED NOT DETECTED Final   Escherichia coli NOT DETECTED NOT DETECTED Final   Klebsiella aerogenes NOT DETECTED NOT DETECTED Final   Klebsiella oxytoca NOT DETECTED NOT  DETECTED Final   Klebsiella pneumoniae NOT DETECTED NOT DETECTED Final    Proteus species NOT DETECTED NOT DETECTED Final   Salmonella species NOT DETECTED NOT DETECTED Final   Serratia marcescens NOT DETECTED NOT DETECTED Final   Haemophilus influenzae NOT DETECTED NOT DETECTED Final   Neisseria meningitidis NOT DETECTED NOT DETECTED Final   Pseudomonas aeruginosa NOT DETECTED NOT DETECTED Final   Stenotrophomonas maltophilia NOT DETECTED NOT DETECTED Final   Candida albicans NOT DETECTED NOT DETECTED Final   Candida auris NOT DETECTED NOT DETECTED Final   Candida glabrata NOT DETECTED NOT DETECTED Final   Candida krusei NOT DETECTED NOT DETECTED Final   Candida parapsilosis NOT DETECTED NOT DETECTED Final   Candida tropicalis NOT DETECTED NOT DETECTED Final   Cryptococcus neoformans/gattii NOT DETECTED NOT DETECTED Final    Comment: Performed at [facility], [address] [place], [place] [address]  Resp panel by RT-PCR (RSV, Flu A&B, Covid) Anterior Nasal Swab     Status: None   Collection Time: 04/22/23  1:28 PM   Specimen: Anterior Nasal Swab  Result Value Ref Range Status   SARS Coronavirus 2 by RT PCR NEGATIVE NEGATIVE Final    Comment: (NOTE) SARS-CoV-2 target nucleic acids are NOT DETECTED.  The SARS-CoV-2 RNA is generally detectable in upper respiratory specimens during the acute phase of infection. The lowest concentration of SARS-CoV-2 viral copies this assay can detect is 138 copies/mL. A negative result does not preclude SARS-Cov-2 infection and should not be used as the sole basis for treatment or other patient management decisions. A negative result may occur with  improper specimen collection/handling, submission of specimen other than nasopharyngeal swab, presence of viral mutation(s) within the areas targeted by this assay, and inadequate number of viral copies(<138 copies/mL). A negative result must be combined with clinical observations, patient history, and epidemiological information. The expected result is  Negative.  Fact Sheet for Patients:  BloggerCourse.com  Fact Sheet for Healthcare Providers:  SeriousBroker.it  This test is no t yet approved or cleared by the [place] FDA and  has been authorized for detection and/or diagnosis of SARS-CoV-2 by FDA under an Emergency Use Authorization (EUA). This EUA will remain  in effect (meaning this test can be used) for the duration of the COVID-19 declaration under Section 564(b)(1) of the Act, 21 U.S.C.section 360bbb-3(b)(1), unless the authorization is terminated  or revoked sooner.       Influenza A by PCR NEGATIVE NEGATIVE Final   Influenza B by PCR NEGATIVE NEGATIVE Final    Comment: (NOTE) The Xpert Xpress SARS-CoV-2/FLU/RSV plus assay is intended as an aid in the diagnosis of influenza from Nasopharyngeal swab specimens and should not be used as a sole basis for treatment. Nasal washings and aspirates are unacceptable for Xpert Xpress SARS-CoV-2/FLU/RSV testing.  Fact Sheet for Patients: BloggerCourse.com  Fact Sheet for Healthcare Providers: SeriousBroker.it  This test is not yet approved or cleared by the [place] FDA and has been authorized for detection and/or diagnosis of SARS-CoV-2 by FDA under an Emergency Use Authorization (EUA). This EUA will remain in effect (meaning this test can be used) for the duration of the COVID-19 declaration under Section 564(b)(1) of the Act, 21 U.S.C. section 360bbb-3(b)(1), unless the authorization is terminated or revoked.     Resp Syncytial Virus by PCR NEGATIVE NEGATIVE Final    Comment: (NOTE) Fact Sheet for Patients: BloggerCourse.com  Fact Sheet for Healthcare Providers: SeriousBroker.it  This test is not yet approved or  cleared by the [place] and has been authorized for detection and/or diagnosis of  SARS-CoV-2 by FDA under an Emergency Use Authorization (EUA). This EUA will remain in effect (meaning this test can be used) for the duration of the COVID-19 declaration under Section 564(b)(1) of the Act, 21 U.S.C. section 360bbb-3(b)(1), unless the authorization is terminated or revoked.  Performed at [facility], [address], [place], [place] [address]    Time coordinating discharge:  44 mins   SIGNED:  Standley Dakins, MD  [facility] 04/24/2023, 11:10 AM How to contact the [facility] Attending or Consulting provider 7A - 7P or covering provider during after hours 7P -7A, for this patient?  Check the care team in [facility] and look for a) attending/consulting [facility] provider listed and b) the [facility] team listed Log into [email or web address] and use [place]'s universal password to access. If you do not have the password, please contact the hospital operator. Locate the [facility] provider you are looking for under [facility] and page to a number that you can be directly reached. If you still have difficulty reaching the provider, please page the [facility] (Director on Call) for the Hospitalists listed on amion for assistance.

## 2023-04-24 NOTE — Plan of Care (Signed)

## 2023-04-24 NOTE — Discharge Instructions (Signed)
PLEASE FOLLOW UP WITH UROLOGY OFFICE FOR FOLEY REMOVAL AND VOIDING TRIAL.   PLEASE HAVE LABS RECHECKED WITH PRIMARY CARE PROVIDER IN 7-10 DAYS    IMPORTANT INFORMATION: PAY CLOSE ATTENTION   PHYSICIAN DISCHARGE INSTRUCTIONS  Follow with Primary care provider  Ignatius Specking, MD  and other consultants as instructed by your Hospitalist Physician  SEEK MEDICAL CARE OR RETURN TO EMERGENCY ROOM IF SYMPTOMS COME BACK, WORSEN OR NEW PROBLEM DEVELOPS   Please note: You were cared for by a hospitalist during your hospital stay. Every effort will be made to forward records to your primary care provider.  You can request that your primary care provider send for your hospital records if they have not received them.  Once you are discharged, your primary care physician will handle any further medical issues. Please note that NO REFILLS for any discharge medications will be authorized once you are discharged, as it is imperative that you return to your primary care physician (or establish a relationship with a primary care physician if you do not have one) for your post hospital discharge needs so that they can reassess your need for medications and monitor your lab values.  Please get a complete blood count and chemistry panel checked by your Primary MD at your next visit, and again as instructed by your Primary MD.  Get Medicines reviewed and adjusted: Please take all your medications with you for your next visit with your Primary MD  Laboratory/radiological data: Please request your Primary MD to go over all hospital tests and procedure/radiological results at the follow up, please ask your primary care provider to get all Hospital records sent to his/her office.  In some cases, they will be blood work, cultures and biopsy results pending at the time of your discharge. Please request that your primary care provider follow up on these results.  If you are diabetic, please bring your blood sugar readings  with you to your follow up appointment with primary care.    Please call and make your follow up appointments as soon as possible.    Also Note the following: If you experience worsening of your admission symptoms, develop shortness of breath, life threatening emergency, suicidal or homicidal thoughts you must seek medical attention immediately by calling 911 or calling your MD immediately  if symptoms less severe.  You must read complete instructions/literature along with all the possible adverse reactions/side effects for all the Medicines you take and that have been prescribed to you. Take any new Medicines after you have completely understood and accpet all the possible adverse reactions/side effects.   Do not drive when taking Pain medications or sleeping medications (Benzodiazepines)  Do not take more than prescribed Pain, Sleep and Anxiety Medications. It is not advisable to combine anxiety,sleep and pain medications without talking with your primary care practitioner  Special Instructions: If you have smoked or chewed Tobacco  in the last 2 yrs please stop smoking, stop any regular Alcohol  and or any Recreational drug use.  Wear Seat belts while driving.  Do not drive if taking any narcotic, mind altering or controlled substances or recreational drugs or alcohol.

## 2023-04-25 ENCOUNTER — Telehealth (INDEPENDENT_AMBULATORY_CARE_PROVIDER_SITE_OTHER): Payer: Self-pay | Admitting: Gastroenterology

## 2023-04-25 LAB — CULTURE, BLOOD (ROUTINE X 2)

## 2023-04-25 NOTE — Telephone Encounter (Signed)
Pt contacted and rescheduled to 05/10/23 at 9 AM. Updated instructions sent via mail.

## 2023-04-25 NOTE — Telephone Encounter (Signed)
Please reschedule him in two weeks Thanks

## 2023-04-25 NOTE — Telephone Encounter (Signed)
Pt has EGD coming up tomorrow but was admitted to hospital this weekend and came home yesterday. Is pt ok to have EGD tomorrow? Please advise. Thank you

## 2023-04-29 DIAGNOSIS — I10 Essential (primary) hypertension: Secondary | ICD-10-CM | POA: Diagnosis not present

## 2023-04-29 DIAGNOSIS — Z932 Ileostomy status: Secondary | ICD-10-CM | POA: Diagnosis not present

## 2023-04-29 DIAGNOSIS — K7460 Unspecified cirrhosis of liver: Secondary | ICD-10-CM | POA: Diagnosis not present

## 2023-04-29 DIAGNOSIS — N400 Enlarged prostate without lower urinary tract symptoms: Secondary | ICD-10-CM | POA: Diagnosis not present

## 2023-04-29 DIAGNOSIS — N5089 Other specified disorders of the male genital organs: Secondary | ICD-10-CM | POA: Diagnosis not present

## 2023-04-29 DIAGNOSIS — Z09 Encounter for follow-up examination after completed treatment for conditions other than malignant neoplasm: Secondary | ICD-10-CM | POA: Diagnosis not present

## 2023-04-29 DIAGNOSIS — Z299 Encounter for prophylactic measures, unspecified: Secondary | ICD-10-CM | POA: Diagnosis not present

## 2023-05-03 NOTE — Progress Notes (Signed)
H&P  Chief Complaint: Urinary retention, hydronephrosis  History of Present Illness: 82 year old male is here for first visit.  He has a recent history of admission to the hospital for treatment of bacteremia, acute retention with bilateral hydronephrosis.  He was treated with Foley catheter placement.  He was started on tamsulosin.  CT scan was performed at the time of his presentation to the emergency room.  He had a cirrhotic liver, small volume ascites, bilateral hydroureteronephrosis to the bladder, small enhancing left-sided bladder wall abnormality.  Currently he has a Foley catheter in.  Prior to presentation, he was on Flomax for about 2 to 3 years.  Despite that he is having a slow stream, feeling of incomplete emptying, urgency and occasional urgency incontinence.  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 Lita Mains   PE (pulmonary embolism)    Temporarily on Eliquis 2017   Skin cancer    35 years ago   Type 2 diabetes mellitus (HCC)    Vitamin B 12 deficiency 07/10/2019    Past Surgical History:  Procedure Laterality Date   ABDOMINOPERINEAL PROCTOCOLECTOMY  2009   end colostomy    BIOPSY  08/25/2018   Procedure: BIOPSY;  Surgeon: Malissa Hippo, MD;  [facility];  Service: Endoscopy;;   BIOPSY  10/04/2021   Procedure: BIOPSY;  Surgeon: Malissa Hippo, MD;  [facility];  Service: Endoscopy;;  small, bowel   BLADDER REPAIR  08/29/2018   Procedure: BLADDER REPAIR;  Surgeon: Lucretia Roers, MD;  Location: [facility];  Service: General;;   COLECTOMY  2008   COLONOSCOPY WITH PROPOFOL N/A 08/25/2018   Procedure: COLONOSCOPY WITH PROPOFOL;  Surgeon: Malissa Hippo, MD;  [facility]: [facility];  Service: Endoscopy;  Laterality: N/A;  2:20   COLOSTOMY     EXPLORATORY LAPAROTOMY  2017   ? pneumoperitoneum of unknown etiology/ negative Ex lap   GIVENS CAPSULE STUDY  10/04/2021   Procedure: GIVENS CAPSULE STUDY;  Surgeon: Malissa Hippo, MD;  Location: [facility];  Service: Endoscopy;;   ILEOSCOPY N/A 10/04/2021   Procedure: ILEOSCOPY THROUGH STOMA;  Surgeon: Malissa Hippo, MD;  [facility];  Service: Endoscopy;  Laterality: N/A;   ILEOSTOMY  08/29/2018   Procedure: ILEOSTOMY;  Surgeon: Lucretia Roers, MD;  Location: [facility];  Service: General;;   LAPAROSCOPIC CHOLECYSTECTOMY  2007   LYSIS OF ADHESION  08/29/2018   Procedure: LYSIS OF ADHESION;  Surgeon: Lucretia Roers, MD;  Location: [facility];  Service: General;;   PARTIAL COLECTOMY N/A 08/29/2018   Procedure: PARTIAL COLECTOMY;  Surgeon: Lucretia Roers, MD;  Location: [facility];  Service: General;  Laterality: N/A;   PORTACATH PLACEMENT Left 10/04/2018   Procedure: INSERTION PORT-A-CATH (attached catheter left subclavian);  Surgeon: Lucretia Roers, MD;  Location: [facility];  Service: General;  Laterality: Left;   SBO SURGERY  06/2009    Home Medications:  Allergies as of 05/09/2023   No Known Allergies      Medication List        Accurate as of May 03, 2023 12:02 PM. If you have any questions,  ask your nurse or doctor.          acetaminophen 325 MG suppository Commonly known as: TYLENOL Place 325 mg rectally every 4 (four) hours as needed for moderate pain.   furosemide 40 MG tablet Commonly known as: LASIX Take 20-40 mg by mouth daily as needed.   lactose free nutrition Liqd Take 237 mLs by mouth daily at 6 (six) AM.   levothyroxine 50 MCG tablet Commonly known as: SYNTHROID Take 50 mcg by mouth daily before breakfast.   loperamide 2 MG capsule Commonly known as: IMODIUM Take 2 capsules (4 mg total) by mouth 4 (four) times daily -  before meals and at bedtime.    OneTouch Verio test strip Generic drug: glucose blood 1 each 2 (two) times daily.   tamsulosin 0.4 MG Caps capsule Commonly known as: FLOMAX Take 1 capsule (0.4 mg total) by mouth daily after supper.   Vitamin D (Ergocalciferol) 1.25 MG (50000 UNIT) Caps capsule Commonly known as: DRISDOL Take 1 capsule by mouth once a week        Allergies: No Known Allergies  Family History  Problem Relation Age of Onset   Heart disease Father    Heart failure Father    Heart attack Father    Prostate cancer Paternal Uncle     Social History:  reports that he quit smoking about 32 years ago. His smoking use included cigarettes. He started smoking about 59 years ago. He has a 27 pack-year smoking history. He quit smokeless tobacco use about 27 years ago.  His smokeless tobacco use included chew. He reports that he does not drink alcohol and does not use drugs.  ROS: A complete review of systems was performed.  All systems are negative except for pertinent findings as noted.  Physical Exam:  Vital signs in last 24 hours: There were no vitals taken for this visit. Constitutional:  Alert and oriented, No acute distress Cardiovascular: Regular rate  Respiratory: Normal respiratory effort GI: Abdomen is soft, nontender, nondistended, no abdominal masses. No CVAT.  Genitourinary: Rectal deferred due to him having had a proctocolectomy. Lymphatic: No lymphadenopathy Neurologic: Grossly intact, no focal deficits Psychiatric: Normal mood and affect   I have reviewed notes from referring/previous physicians--Hospital/ER records reviewed  I have reviewed urinalysis results  I have independently reviewed prior imaging-CT images reviewed.  Rough estimate of prostate volume combining "bullet"and "ellipsoid "calculations 50 mL  I have reviewed prior blood cultures.  Urine culture from hospitalization was performed  Bladder scan volume 12 mL   Impression/Assessment:  BPH.  With recent  retention and hydronephrosis.  He is on tamsulosin.  He has voided well after catheter removal today, residual urine volume is 12 mL  Small left-sided bladder wall lesion, may well be urothelial carcinoma  Plan:  I send him home with 3 days of Keflex to cover his catheterization.  Continue tamsulosin  Nurse visit in 1 week to check residual urine volume/bladder scan, office visit with me in 2 to 3 weeks for cystoscopy

## 2023-05-05 ENCOUNTER — Encounter (HOSPITAL_COMMUNITY)
Admission: RE | Admit: 2023-05-05 | Discharge: 2023-05-05 | Disposition: A | Discharge status: Home / Self Care | Payer: Medicare Other | Source: Ambulatory Visit | Attending: Gastroenterology | Admitting: Gastroenterology

## 2023-05-05 ENCOUNTER — Encounter (HOSPITAL_COMMUNITY): Payer: Self-pay | Admitting: Gastroenterology

## 2023-05-09 ENCOUNTER — Ambulatory Visit (INDEPENDENT_AMBULATORY_CARE_PROVIDER_SITE_OTHER): Payer: Medicare Other | Admitting: Urology

## 2023-05-09 ENCOUNTER — Encounter: Payer: Self-pay | Admitting: Urology

## 2023-05-09 VITALS — BP 130/75 | HR 94

## 2023-05-09 DIAGNOSIS — N1330 Unspecified hydronephrosis: Secondary | ICD-10-CM | POA: Diagnosis not present

## 2023-05-09 DIAGNOSIS — N401 Enlarged prostate with lower urinary tract symptoms: Secondary | ICD-10-CM

## 2023-05-09 DIAGNOSIS — N3289 Other specified disorders of bladder: Secondary | ICD-10-CM

## 2023-05-09 DIAGNOSIS — N138 Other obstructive and reflux uropathy: Secondary | ICD-10-CM

## 2023-05-09 DIAGNOSIS — R339 Retention of urine, unspecified: Secondary | ICD-10-CM

## 2023-05-09 LAB — BLADDER SCAN AMB NON-IMAGING: Scan Result: 12

## 2023-05-09 MED ORDER — CEPHALEXIN 500 MG PO CAPS
500.0000 mg | ORAL_CAPSULE | Freq: Two times a day (BID) | ORAL | 0 refills | Status: DC
Start: 2023-05-09 — End: 2023-10-03

## 2023-05-09 NOTE — Progress Notes (Signed)
Fill and Pull Catheter Removal  Patient is present today for a catheter removal.  Patient was cleaned and prepped in a sterile fashion of sterile water/ saline was instilled into the bladder when the patient felt the urge to urinate. 10ml of water was then drained from the balloon.  A 16FR foley cath was removed from the bladder no complications were noted .  Patient as then given some time to void on their own.  Patient can void  on their own after some time.  Patient tolerated well.  Performed by: Kennyth Lose, CMA  Follow up/ Additional notes: Keep follow up

## 2023-05-10 ENCOUNTER — Ambulatory Visit (HOSPITAL_COMMUNITY): Payer: Medicare Other | Admitting: Anesthesiology

## 2023-05-10 ENCOUNTER — Ambulatory Visit (HOSPITAL_COMMUNITY)
Admission: RE | Admit: 2023-05-10 | Discharge: 2023-05-10 | Disposition: A | Discharge status: Home / Self Care | Payer: Medicare Other | Attending: Gastroenterology | Admitting: Gastroenterology

## 2023-05-10 ENCOUNTER — Encounter (HOSPITAL_COMMUNITY): Payer: Self-pay | Admitting: Gastroenterology

## 2023-05-10 ENCOUNTER — Encounter (HOSPITAL_COMMUNITY)
Admission: RE | Disposition: A | Discharge status: Home / Self Care | Payer: Self-pay | Source: Home / Self Care | Attending: Gastroenterology

## 2023-05-10 DIAGNOSIS — D649 Anemia, unspecified: Secondary | ICD-10-CM | POA: Insufficient documentation

## 2023-05-10 DIAGNOSIS — E039 Hypothyroidism, unspecified: Secondary | ICD-10-CM | POA: Insufficient documentation

## 2023-05-10 DIAGNOSIS — I4891 Unspecified atrial fibrillation: Secondary | ICD-10-CM | POA: Diagnosis not present

## 2023-05-10 DIAGNOSIS — E119 Type 2 diabetes mellitus without complications: Secondary | ICD-10-CM | POA: Diagnosis not present

## 2023-05-10 DIAGNOSIS — Z932 Ileostomy status: Secondary | ICD-10-CM | POA: Insufficient documentation

## 2023-05-10 DIAGNOSIS — E1122 Type 2 diabetes mellitus with diabetic chronic kidney disease: Secondary | ICD-10-CM

## 2023-05-10 DIAGNOSIS — K766 Portal hypertension: Secondary | ICD-10-CM | POA: Diagnosis not present

## 2023-05-10 DIAGNOSIS — K3189 Other diseases of stomach and duodenum: Secondary | ICD-10-CM | POA: Insufficient documentation

## 2023-05-10 DIAGNOSIS — K7469 Other cirrhosis of liver: Secondary | ICD-10-CM | POA: Diagnosis not present

## 2023-05-10 DIAGNOSIS — N189 Chronic kidney disease, unspecified: Secondary | ICD-10-CM | POA: Diagnosis not present

## 2023-05-10 DIAGNOSIS — M549 Dorsalgia, unspecified: Secondary | ICD-10-CM | POA: Diagnosis not present

## 2023-05-10 DIAGNOSIS — Z85038 Personal history of other malignant neoplasm of large intestine: Secondary | ICD-10-CM | POA: Insufficient documentation

## 2023-05-10 DIAGNOSIS — Z87891 Personal history of nicotine dependence: Secondary | ICD-10-CM | POA: Insufficient documentation

## 2023-05-10 DIAGNOSIS — Z86711 Personal history of pulmonary embolism: Secondary | ICD-10-CM | POA: Insufficient documentation

## 2023-05-10 DIAGNOSIS — Z8673 Personal history of transient ischemic attack (TIA), and cerebral infarction without residual deficits: Secondary | ICD-10-CM | POA: Diagnosis not present

## 2023-05-10 DIAGNOSIS — I129 Hypertensive chronic kidney disease with stage 1 through stage 4 chronic kidney disease, or unspecified chronic kidney disease: Secondary | ICD-10-CM

## 2023-05-10 DIAGNOSIS — Z7984 Long term (current) use of oral hypoglycemic drugs: Secondary | ICD-10-CM | POA: Insufficient documentation

## 2023-05-10 DIAGNOSIS — K7460 Unspecified cirrhosis of liver: Secondary | ICD-10-CM | POA: Diagnosis not present

## 2023-05-10 DIAGNOSIS — M1990 Unspecified osteoarthritis, unspecified site: Secondary | ICD-10-CM | POA: Insufficient documentation

## 2023-05-10 DIAGNOSIS — I8510 Secondary esophageal varices without bleeding: Secondary | ICD-10-CM | POA: Insufficient documentation

## 2023-05-10 DIAGNOSIS — I10 Essential (primary) hypertension: Secondary | ICD-10-CM | POA: Diagnosis not present

## 2023-05-10 HISTORY — PX: ESOPHAGOGASTRODUODENOSCOPY (EGD) WITH PROPOFOL: SHX5813

## 2023-05-10 LAB — GLUCOSE, CAPILLARY
Glucose-Capillary: 64 mg/dL — ABNORMAL LOW (ref 70–99)
Glucose-Capillary: 94 mg/dL (ref 70–99)
Glucose-Capillary: 95 mg/dL (ref 70–99)

## 2023-05-10 SURGERY — ESOPHAGOGASTRODUODENOSCOPY (EGD) WITH PROPOFOL
Anesthesia: General

## 2023-05-10 MED ORDER — PROPOFOL 10 MG/ML IV BOLUS
INTRAVENOUS | Status: DC | PRN
  Administered 2023-05-10: 80 mg via INTRAVENOUS

## 2023-05-10 MED ORDER — CARVEDILOL 3.125 MG PO TABS: 3.1250 mg | ORAL_TABLET | Freq: Two times a day (BID) | ORAL | 3 refills | Status: DC

## 2023-05-10 MED ORDER — DEXTROSE 50 % IV SOLN
INTRAVENOUS | Status: AC
  Administered 2023-05-10: 25 mL via INTRAVENOUS
  Filled 2023-05-10: qty 50

## 2023-05-10 MED ORDER — LACTATED RINGERS IV SOLN
INTRAVENOUS | Status: DC | PRN
Start: 2023-05-10 — End: 2023-05-10

## 2023-05-10 MED ORDER — LIDOCAINE HCL (CARDIAC) PF 100 MG/5ML IV SOSY
PREFILLED_SYRINGE | INTRAVENOUS | Status: DC | PRN
  Administered 2023-05-10: 80 mg via INTRAVENOUS

## 2023-05-10 MED ORDER — LIDOCAINE HCL (PF) 2 % IJ SOLN
INTRAMUSCULAR | Status: AC
  Filled 2023-05-10: qty 5

## 2023-05-10 MED ORDER — PROPOFOL 500 MG/50ML IV EMUL
INTRAVENOUS | Status: DC | PRN
  Administered 2023-05-10: 100 ug/kg/min via INTRAVENOUS

## 2023-05-10 MED ORDER — DEXTROSE 50 % IV SOLN: 25.0000 mL | Freq: Once | INTRAVENOUS | Status: AC

## 2023-05-10 MED ORDER — PHENYLEPHRINE 80 MCG/ML (10ML) SYRINGE FOR IV PUSH (FOR BLOOD PRESSURE SUPPORT)
PREFILLED_SYRINGE | INTRAVENOUS | Status: DC | PRN
Start: 2023-05-10 — End: 2023-05-10
  Administered 2023-05-10: 160 ug via INTRAVENOUS

## 2023-05-10 NOTE — Op Note (Addendum)
[facility] Patient Name: Jose Lawrence Procedure Date: 05/10/2023 9:00 AM MRN: [id number] Date of Birth: 1940/10/01 Attending MD: Katrinka Blazing , , [id number] CSN: [id number] Age: 82 Admit Type: Outpatient Procedure:                Upper GI endoscopy Indications:              Cirrhosis rule out esophageal varices Providers:                Katrinka Blazing, Sheran Fava, Dyann Ruddle Referring MD:              Medicines:                Monitored Anesthesia Care Complications:            No immediate complications. Estimated Blood Loss:     Estimated blood loss: none. Procedure:                Pre-Anesthesia Assessment:                           - Prior to the procedure, a History and Physical                            was performed, and patient medications, allergies                            and sensitivities were reviewed. The patient's                            tolerance of previous anesthesia was reviewed.                           - The risks and benefits of the procedure and the                            sedation options and risks were discussed with the                            patient. All questions were answered and informed                            consent was obtained.                           - ASA Grade Assessment: III - A patient with severe                            systemic disease.                           After obtaining informed consent, the endoscope was                            passed under direct vision. Throughout the                            procedure, the patient's  blood pressure, pulse, and                            oxygen saturations were monitored continuously. The                            [id number]-H190 ([id number]) scope was introduced through the                            mouth, and advanced to the second part of duodenum.                            The upper GI endoscopy was accomplished without                             difficulty. The patient tolerated the procedure                            well. Scope In: 9:18:00 AM Scope Out: 9:20:52 AM Total Procedure Duration: 0 hours 2 minutes 52 seconds  Findings:      Grade I varices were found in the lower third of the esophagus.      Portal hypertensive gastropathy was found in the entire examined stomach.      The examined duodenum was normal. Impression:               - Grade I esophageal varices.                           - Portal hypertensive gastropathy.                           - Normal examined duodenum.                           - No specimens collected. Moderate Sedation:      Per Anesthesia Care Recommendation:           - Discharge patient to home (ambulatory).                           - Resume previous diet.                           - Start carvedilol 3.125 mg twice a day. Procedure Code(s):        --- Professional ---                           [phone], Esophagogastroduodenoscopy, flexible,                            transoral; diagnostic, including collection of                            specimen(s) by brushing or washing, when performed                            (separate  procedure) Diagnosis Code(s):        --- Professional ---                           K74.60, Unspecified cirrhosis of liver                           I85.10, Secondary esophageal varices without                            bleeding                           K76.6, Portal hypertension                           K31.89, Other diseases of stomach and duodenum CPT copyright 2022 [employer]. All rights reserved. The codes documented in this report are preliminary and upon coder review may  be revised to meet current compliance requirements. Katrinka Blazing, MD Katrinka Blazing,  05/10/2023 9:27:37 AM This report has been signed electronically. Number of Addenda: 0

## 2023-05-10 NOTE — H&P (Signed)
Jose Lawrence is an 82 y.o. male.   Chief Complaint: Esophageal variceal screening HPI: 82 year old male with past medical history of BPH, colon cancer status post low anterior resection and APR with removal of rest of colon, chemotherapy, as well as permanent ileostomy, hypothyroidism, history of lacunar brain infarction, type 2 diabetes, coming for esophageal variceal screening.  The patient denies having any nausea, vomiting, fever, chills, hematochezia, melena, hematemesis, abdominal distention, abdominal pain, diarrhea, jaundice, pruritus or weight loss.   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 Lita Mains   PE (pulmonary embolism)    Temporarily on Eliquis 2017   Skin cancer    35 years ago   Type 2 diabetes mellitus (HCC)    Vitamin B 12 deficiency 07/10/2019    Past Surgical History:  Procedure Laterality Date   ABDOMINOPERINEAL PROCTOCOLECTOMY  2009   end colostomy    BIOPSY  08/25/2018   Procedure: BIOPSY;  Surgeon: Malissa Hippo, MD;  [facility];  Service: Endoscopy;;   BIOPSY  10/04/2021   Procedure: BIOPSY;  Surgeon: Malissa Hippo, MD;  [facility];  Service: Endoscopy;;  small, bowel   BLADDER REPAIR  08/29/2018   Procedure: BLADDER REPAIR;  Surgeon: Lucretia Roers, MD;  [facility]: [facility];  Service: General;;   COLECTOMY  2008   COLONOSCOPY WITH PROPOFOL N/A 08/25/2018   Procedure: COLONOSCOPY WITH PROPOFOL;  Surgeon: Malissa Hippo, MD;  [facility];  Service: Endoscopy;  Laterality: N/A;  2:20   COLOSTOMY     EXPLORATORY LAPAROTOMY  2017   ? pneumoperitoneum of unknown etiology/ negative Ex lap   GIVENS CAPSULE STUDY  10/04/2021   Procedure: GIVENS  CAPSULE STUDY;  Surgeon: Malissa Hippo, MD;  [facility];  Service: Endoscopy;;   ILEOSCOPY N/A 10/04/2021   Procedure: ILEOSCOPY THROUGH STOMA;  Surgeon: Malissa Hippo, MD;  [facility];  Service: Endoscopy;  Laterality: N/A;   ILEOSTOMY  08/29/2018   Procedure: ILEOSTOMY;  Surgeon: Lucretia Roers, MD;  [facility];  Service: General;;   LAPAROSCOPIC CHOLECYSTECTOMY  2007   LYSIS OF ADHESION  08/29/2018   Procedure: LYSIS OF ADHESION;  Surgeon: Lucretia Roers, MD;  [facility]: [facility];  Service: General;;   PARTIAL COLECTOMY N/A 08/29/2018   Procedure: PARTIAL COLECTOMY;  Surgeon: Lucretia Roers, MD;  [facility]: [facility];  Service: General;  Laterality: N/A;   PORTACATH PLACEMENT Left 10/04/2018   Procedure: INSERTION PORT-A-CATH (attached catheter left subclavian);  Surgeon: Lucretia Roers, MD;  Location: [facility];  Service: General;  Laterality: Left;   SBO SURGERY  06/2009    Family History  Problem Relation Age of Onset   Heart disease Father    Heart failure Father    Heart attack Father    Prostate cancer Paternal Uncle    Social History:  reports that he quit smoking about 32 years ago. His smoking use included cigarettes. He started smoking about 59 years ago. He has a 27 pack-year smoking history. He quit smokeless tobacco use about 27 years ago.  His smokeless tobacco use included chew. He reports  that he does not drink alcohol and does not use drugs.  Allergies: No Known Allergies  Medications Prior to Admission  Medication Sig Dispense Refill   acetaminophen (TYLENOL) 325 MG suppository Place 325 mg rectally every 4 (four) hours as needed for moderate pain.     cephALEXin (KEFLEX) 500 MG capsule Take 1 capsule (500 mg total) by mouth 2 (two) times daily for 6 doses. 6 capsule 0   furosemide (LASIX) 40 MG tablet Take 20-40 mg by mouth daily as needed.     lactose free nutrition (BOOST) LIQD Take 237 mLs by mouth daily at 6 (six) AM.      levothyroxine (SYNTHROID, LEVOTHROID) 50 MCG tablet Take 50 mcg by mouth daily before breakfast.     loperamide (IMODIUM) 2 MG capsule Take 2 capsules (4 mg total) by mouth 4 (four) times daily -  before meals and at bedtime. 240 capsule 3   ONETOUCH VERIO test strip 1 each 2 (two) times daily.     tamsulosin (FLOMAX) 0.4 MG CAPS capsule Take 1 capsule (0.4 mg total) by mouth daily after supper. 30 capsule 3   Vitamin D, Ergocalciferol, (DRISDOL) 1.25 MG (50000 UNIT) CAPS capsule Take 1 capsule by mouth once a week 16 capsule 0    Results for orders placed or performed during the hospital encounter of 05/10/23 (from the past 48 hour(s))  Glucose, capillary     Status: Abnormal   Collection Time: 05/10/23  7:42 AM  Result Value Ref Range   Glucose-Capillary 64 (L) 70 - 99 mg/dL    Comment: Glucose reference range applies only to samples taken after fasting for at least 8 hours.  Glucose, capillary     Status: None   Collection Time: 05/10/23  8:49 AM  Result Value Ref Range   Glucose-Capillary 94 70 - 99 mg/dL    Comment: Glucose reference range applies only to samples taken after fasting for at least 8 hours.   No results found.  Review of Systems  All other systems reviewed and are negative.   Blood pressure (!) 143/62, pulse (!) 58, temperature 98.4 F (36.9 C), temperature source Oral, resp. rate 17, height 6' (1.829 m), weight 89.5 kg, SpO2 100%. Physical Exam  GENERAL: The patient is AO x3, in no acute distress. HEENT: Head is normocephalic and atraumatic. EOMI are intact. Mouth is well hydrated and without lesions. NECK: Supple. No masses LUNGS: Clear to auscultation. No presence of rhonchi/wheezing/rales. Adequate chest expansion HEART: RRR, normal s1 and s2. ABDOMEN: Soft, nontender, no guarding, no peritoneal signs, and nondistended. BS +. No masses. EXTREMITIES: Without any cyanosis, clubbing, rash, lesions or edema. NEUROLOGIC: AOx3, no focal motor deficit. SKIN: no  jaundice, no rashes  Assessment/Plan 82 year old male with past medical history of BPH, colon cancer status post low anterior resection and APR with removal of rest of colon, chemotherapy, as well as permanent ileostomy, hypothyroidism, history of lacunar brain infarction, type 2 diabetes, coming for esophageal variceal screening.  Will proceed with EGD.  Dolores Frame, MD 05/10/2023, 9:14 AM

## 2023-05-10 NOTE — Anesthesia Preprocedure Evaluation (Addendum)
Anesthesia Evaluation  Patient identified by MRN, date of birth, ID band Patient awake    Reviewed: Allergy & Precautions, NPO status , Patient's Chart, lab work & pertinent test results, reviewed documented beta blocker date and time   Airway Mallampati: II  TM Distance: >3 FB Neck ROM: Full    Dental  (+) Edentulous Upper, Edentulous Lower   Pulmonary former smoker, PE   Pulmonary exam normal breath sounds clear to auscultation       Cardiovascular Exercise Tolerance: Good hypertension, Pt. on medications and Pt. on home beta blockers Normal cardiovascular exam+ dysrhythmias Atrial Fibrillation  Rhythm:Regular Rate:Normal     Neuro/Psych  Neuromuscular disease CVA  negative psych ROS   GI/Hepatic Neg liver ROS, Bowel prep,,,Colon cancer,ileostomy in place   Endo/Other  diabetes, Well Controlled, Type 2, Oral Hypoglycemic AgentsHypothyroidism    Renal/GU Renal InsufficiencyRenal disease  negative genitourinary   Musculoskeletal  (+) Arthritis ,    Abdominal   Peds negative pediatric ROS (+)  Hematology  (+) Blood dyscrasia, anemia   Anesthesia Other Findings Back pain  Reproductive/Obstetrics negative OB ROS                              Anesthesia Physical Anesthesia Plan  ASA: 3  Anesthesia Plan: General   Post-op Pain Management: Minimal or no pain anticipated   Induction: Intravenous  PONV Risk Score and Plan: TIVA  Airway Management Planned: Nasal Cannula and Natural Airway  Additional Equipment:   Intra-op Plan:   Post-operative Plan:   Informed Consent: I have reviewed the patients History and Physical, chart, labs and discussed the procedure including the risks, benefits and alternatives for the proposed anesthesia with the patient or authorized representative who has indicated his/her understanding and acceptance.     Dental advisory given  Plan Discussed  with: CRNA and Surgeon  Anesthesia Plan Comments:          Anesthesia Quick Evaluation

## 2023-05-10 NOTE — Transfer of Care (Addendum)
Immediate Anesthesia Transfer of Care Note  Patient: Jose Lawrence  Procedure(s) Performed: ESOPHAGOGASTRODUODENOSCOPY (EGD) WITH PROPOFOL  Patient Location: Short Stay  Anesthesia Type:General  Level of Consciousness: drowsy and patient cooperative  Airway & Oxygen Therapy: Patient Spontanous Breathing  Post-op Assessment: Report given to RN and Post -op Vital signs reviewed and stable  Post vital signs: Reviewed and stable  Last Vitals:  Vitals Value Taken Time  BP 113/52 05/10/23   0927  Temp 36.8 05/10/23   0927  Pulse 64 05/10/23   0927  Resp 18 05/10/23   0927  SpO2 100% 05/10/23   0927    Last Pain:  Vitals:   05/10/23 0912  TempSrc:   PainSc: 0-No pain         Complications: No notable events documented.

## 2023-05-10 NOTE — Discharge Instructions (Signed)
You are being discharged to home.  Resume your previous diet.  Start carvedilol 3.125 mg twice a day.

## 2023-05-10 NOTE — Anesthesia Postprocedure Evaluation (Signed)
Anesthesia Post Note  Patient: Jose Lawrence  Procedure(s) Performed: ESOPHAGOGASTRODUODENOSCOPY (EGD) WITH PROPOFOL  Patient location during evaluation: PACU Anesthesia Type: General Level of consciousness: awake and alert Pain management: pain level controlled Vital Signs Assessment: post-procedure vital signs reviewed and stable Respiratory status: spontaneous breathing, nonlabored ventilation, respiratory function stable and patient connected to nasal cannula oxygen Cardiovascular status: blood pressure returned to baseline and stable Postop Assessment: no apparent nausea or vomiting Anesthetic complications: no   There were no known notable events for this encounter.   Last Vitals:  Vitals:   05/10/23 0927 05/10/23 0931  BP: (!) 113/52 (!) 119/51  Pulse: 64   Resp: 18   Temp: 36.8 C   SpO2: 100% 99%    Last Pain:  Vitals:   05/10/23 0931  TempSrc:   PainSc: 0-No pain                 Marlies Ligman L Shandy Checo

## 2023-05-17 ENCOUNTER — Ambulatory Visit (INDEPENDENT_AMBULATORY_CARE_PROVIDER_SITE_OTHER): Payer: Medicare Other

## 2023-05-17 DIAGNOSIS — N138 Other obstructive and reflux uropathy: Secondary | ICD-10-CM

## 2023-05-17 DIAGNOSIS — N401 Enlarged prostate with lower urinary tract symptoms: Secondary | ICD-10-CM

## 2023-05-17 NOTE — Progress Notes (Addendum)
post void residual =83ml  Patient will follow up with MD as scheduled.

## 2023-05-18 ENCOUNTER — Other Ambulatory Visit: Payer: Self-pay | Admitting: *Deleted

## 2023-05-18 ENCOUNTER — Other Ambulatory Visit: Payer: Self-pay | Admitting: Hematology

## 2023-05-18 DIAGNOSIS — E559 Vitamin D deficiency, unspecified: Secondary | ICD-10-CM

## 2023-05-19 ENCOUNTER — Encounter (HOSPITAL_COMMUNITY): Payer: Self-pay | Admitting: Gastroenterology

## 2023-05-30 ENCOUNTER — Ambulatory Visit (INDEPENDENT_AMBULATORY_CARE_PROVIDER_SITE_OTHER): Payer: Medicare Other | Admitting: Urology

## 2023-05-30 ENCOUNTER — Encounter: Payer: Self-pay | Admitting: Urology

## 2023-05-30 VITALS — BP 133/71 | HR 55

## 2023-05-30 DIAGNOSIS — N3289 Other specified disorders of bladder: Secondary | ICD-10-CM | POA: Diagnosis not present

## 2023-05-30 DIAGNOSIS — N401 Enlarged prostate with lower urinary tract symptoms: Secondary | ICD-10-CM

## 2023-05-30 DIAGNOSIS — R339 Retention of urine, unspecified: Secondary | ICD-10-CM

## 2023-05-30 DIAGNOSIS — N329 Bladder disorder, unspecified: Secondary | ICD-10-CM

## 2023-05-30 DIAGNOSIS — N1330 Unspecified hydronephrosis: Secondary | ICD-10-CM | POA: Diagnosis not present

## 2023-05-30 DIAGNOSIS — N138 Other obstructive and reflux uropathy: Secondary | ICD-10-CM | POA: Diagnosis not present

## 2023-05-30 MED ORDER — CEPHALEXIN 500 MG PO CAPS: 500.0000 mg | ORAL_CAPSULE | Freq: Four times a day (QID) | ORAL | 0 refills | Status: AC

## 2023-05-30 MED ORDER — CIPROFLOXACIN HCL 500 MG PO TABS
500.0000 mg | ORAL_TABLET | Freq: Once | ORAL | Status: DC
Start: 2023-05-30 — End: 2023-05-30

## 2023-05-30 NOTE — Progress Notes (Addendum)
History of Present Illness:   9.30.2024: Initial visit following admission to the hospital for treatment of bacteremia, acute retention with bilateral hydronephrosis.  He was treated with Foley catheter placement.  He was started on tamsulosin.  CT scan was performed at the time of his presentation to the emergency room.  He had a cirrhotic liver, small volume ascites, bilateral hydroureteronephrosis to the bladder, small enhancing left-sided bladder wall abnormality.  Prior to presentation, he was on Flomax for about 2 to 3 years.  Despite that he is having a slow stream, feeling of incomplete emptying, urgency and occasional urgency incontinence.  He passed a TOV in the office. Followup bladder scan a week later revealed a pvr volume of 0.  10.21.2024: Here for cysto to f/u bladder abnormality on CT. He is having no voiding issues  Past Medical History:  Diagnosis Date   Anemia    Causing interruption in anticoagulation   Atrial fibrillation and flutter (HCC)    BPH (benign prostatic hypertrophy)    Colon cancer (HCC)    Status post LAR 2008   Dysrhythmia    Hypothyroidism    Lacunar infarction (HCC) 11/2012   RT 3rd NERVE PALSY, SB Dr. Lita Mains   PE (pulmonary embolism)    Temporarily on Eliquis 2017   Skin cancer    35 years ago   Type 2 diabetes mellitus (HCC)    Vitamin B 12 deficiency 07/10/2019    Past Surgical History:  Procedure Laterality Date   ABDOMINOPERINEAL PROCTOCOLECTOMY  2009   end colostomy    BIOPSY  08/25/2018   Procedure: BIOPSY;  Surgeon: Malissa Hippo, MD;  [facility];  Service: Endoscopy;;   BIOPSY  10/04/2021   Procedure: BIOPSY;  Surgeon: Malissa Hippo, MD;  [facility];  Service: Endoscopy;;  small, bowel   BLADDER REPAIR  08/29/2018   Procedure: BLADDER REPAIR;  Surgeon: Lucretia Roers, MD;  [facility];  Service: General;;   COLECTOMY  2008   COLONOSCOPY WITH PROPOFOL N/A 08/25/2018   Procedure: COLONOSCOPY  WITH PROPOFOL;  Surgeon: Malissa Hippo, MD;  [facility];  Service: Endoscopy;  Laterality: N/A;  2:20   COLOSTOMY     ESOPHAGOGASTRODUODENOSCOPY (EGD) WITH PROPOFOL N/A 05/10/2023   Procedure: ESOPHAGOGASTRODUODENOSCOPY (EGD) WITH PROPOFOL;  Surgeon: Dolores Frame, MD;  [facility];  Service: Gastroenterology;  Laterality: N/A;  11:00AM;ASA 3   EXPLORATORY LAPAROTOMY  2017   ? pneumoperitoneum of unknown etiology/ negative Ex lap   GIVENS CAPSULE STUDY  10/04/2021   Procedure: GIVENS CAPSULE STUDY;  Surgeon: Malissa Hippo, MD;  [facility];  Service: Endoscopy;;   ILEOSCOPY N/A 10/04/2021   Procedure: ILEOSCOPY THROUGH STOMA;  Surgeon: Malissa Hippo, MD;  [facility];  Service: Endoscopy;  Laterality: N/A;   ILEOSTOMY  08/29/2018   Procedure: ILEOSTOMY;  Surgeon: Lucretia Roers, MD;  [facility];  Service: General;;   LAPAROSCOPIC CHOLECYSTECTOMY  2007   LYSIS OF ADHESION  08/29/2018   Procedure: LYSIS OF ADHESION;  Surgeon: Lucretia Roers, MD;  [facility];  Service: General;;   PARTIAL COLECTOMY N/A 08/29/2018   Procedure: PARTIAL COLECTOMY;  Surgeon: Lucretia Roers, MD;  [facility];  Service: General;  Laterality: N/A;   PORTACATH PLACEMENT Left 10/04/2018   Procedure: INSERTION PORT-A-CATH (attached catheter left subclavian);  Surgeon: Lucretia Roers, MD;  [facility];  Service: General;  Laterality: Left;  SBO SURGERY  06/2009    Home Medications:  Allergies as of 05/30/2023   No Known Allergies      Medication List        Accurate as of May 30, 2023 11:04 AM. If you have any questions, ask your nurse or doctor.          acetaminophen 325 MG suppository Commonly known as: TYLENOL Place 325 mg rectally every 4 (four) hours as needed for moderate pain.   carvedilol 3.125 MG tablet Commonly known as: Coreg Take 1 tablet (3.125 mg total) by mouth 2 (two) times daily  with a meal.   furosemide 40 MG tablet Commonly known as: LASIX Take 20-40 mg by mouth daily as needed.   lactose free nutrition Liqd Take 237 mLs by mouth daily at 6 (six) AM.   levothyroxine 50 MCG tablet Commonly known as: SYNTHROID Take 50 mcg by mouth daily before breakfast.   loperamide 2 MG capsule Commonly known as: IMODIUM Take 2 capsules (4 mg total) by mouth 4 (four) times daily -  before meals and at bedtime.   OneTouch Verio test strip Generic drug: glucose blood 1 each 2 (two) times daily.   tamsulosin 0.4 MG Caps capsule Commonly known as: FLOMAX Take 1 capsule (0.4 mg total) by mouth daily after supper.   Vitamin D (Ergocalciferol) 1.25 MG (50000 UNIT) Caps capsule Commonly known as: DRISDOL Take 1 capsule by mouth once a week        Allergies: No Known Allergies  Family History  Problem Relation Age of Onset   Heart disease Father    Heart failure Father    Heart attack Father    Prostate cancer Paternal Uncle     Social History:  reports that he quit smoking about 32 years ago. His smoking use included cigarettes. He started smoking about 59 years ago. He has a 27 pack-year smoking history. He quit smokeless tobacco use about 27 years ago.  His smokeless tobacco use included chew. He reports that he does not drink alcohol and does not use drugs.  ROS: A complete review of systems was performed.  All systems are negative except for pertinent findings as noted.  Physical Exam:  Vital signs in last 24 hours: There were no vitals taken for this visit. Constitutional:  Alert and oriented, No acute distress Cardiovascular: Regular rate  Respiratory: Normal respiratory effort GI: Abdomen is soft, nontender, nondistended, no abdominal masses. No CVAT.  Genitourinary: Rectal deferred due to him having had a proctocolectomy. Lymphatic: No lymphadenopathy Neurologic: Grossly intact, no focal deficits Psychiatric: Normal mood and affect   I have  reviewed prior office notes  I have reviewed urinalysis results--some bacteria  I have independently reviewed prior imaging-CT images reviewed.   Cystoscopy Procedure Note:  Indication: Bladder lesion on CT  After informed consent and discussion of the procedure and its risks, DEMECO YAGI was positioned and prepped in the standard fashion.  Cystoscopy was performed with a flexible cystoscope.   Findings: Urethra:Mild distal narrowing Prostate:Minimally obstructive Bladder neck:Open Ureteral orifices:Nml bilaterally Bladder:No lesions/foreign bodies seen  The patient tolerated the procedure well.      Impression/Assessment:  BPH.  With recent retention and hydronephrosis.  He is on tamsulosin.  Continue relatively low urine residual volume  Small left-sided bladder wall lesion seen on CT, not documented on cystoscopy today  Plan:  1.  I reassured him and his wife that he did not have a lesion I can see on  cystoscopy today  2.  I will have him come back in a week or 2 to recheck his voiding/hematuria

## 2023-05-31 ENCOUNTER — Telehealth: Payer: Self-pay

## 2023-05-31 ENCOUNTER — Other Ambulatory Visit: Payer: Self-pay | Admitting: Urology

## 2023-05-31 DIAGNOSIS — N138 Other obstructive and reflux uropathy: Secondary | ICD-10-CM

## 2023-05-31 DIAGNOSIS — A419 Sepsis, unspecified organism: Secondary | ICD-10-CM

## 2023-05-31 DIAGNOSIS — N400 Enlarged prostate without lower urinary tract symptoms: Secondary | ICD-10-CM

## 2023-05-31 LAB — URINALYSIS, ROUTINE W REFLEX MICROSCOPIC
Bilirubin, UA: NEGATIVE
Ketones, UA: NEGATIVE
Nitrite, UA: POSITIVE — AB
Specific Gravity, UA: 1.03 (ref 1.005–1.030)
Urobilinogen, Ur: 1 mg/dL (ref 0.2–1.0)
pH, UA: 6 (ref 5.0–7.5)

## 2023-05-31 LAB — MICROSCOPIC EXAMINATION: WBC, UA: >30 /[HPF] — AB (ref 0–5)

## 2023-05-31 MED ORDER — DOXYCYCLINE HYCLATE 100 MG PO CAPS
100.0000 mg | ORAL_CAPSULE | Freq: Two times a day (BID) | ORAL | 0 refills | Status: DC
Start: 2023-05-31 — End: 2023-06-10

## 2023-05-31 NOTE — Telephone Encounter (Signed)
Patient called in today and states that the Keflex he was prescribe yesterday is causing him to itch all over and would like to be prescribe a different abt.

## 2023-05-31 NOTE — Telephone Encounter (Signed)
Patient is made aware and voiced understanding "Yes, he should stop the Keflex.  Please send in doxycycline 100 mg, 1 p.o. every 12 hours x 3 days, #6.  I would not do that, but epic is not letting me e-prescribe right now"

## 2023-06-02 LAB — URINE CULTURE

## 2023-06-04 ENCOUNTER — Telehealth: Payer: Medicare Other | Admitting: Family Medicine

## 2023-06-04 DIAGNOSIS — N329 Bladder disorder, unspecified: Secondary | ICD-10-CM

## 2023-06-04 NOTE — Progress Notes (Signed)
Advised to call urologist on call due iability to treat male urinary problems over telehealth as per protocol. DWB

## 2023-06-07 ENCOUNTER — Ambulatory Visit: Payer: Medicare Other | Admitting: Urology

## 2023-06-07 ENCOUNTER — Encounter: Payer: Self-pay | Admitting: Urology

## 2023-06-07 VITALS — BP 127/57 | HR 58

## 2023-06-07 DIAGNOSIS — N138 Other obstructive and reflux uropathy: Secondary | ICD-10-CM | POA: Diagnosis not present

## 2023-06-07 DIAGNOSIS — N401 Enlarged prostate with lower urinary tract symptoms: Secondary | ICD-10-CM | POA: Diagnosis not present

## 2023-06-07 DIAGNOSIS — R310 Gross hematuria: Secondary | ICD-10-CM

## 2023-06-07 LAB — URINALYSIS, ROUTINE W REFLEX MICROSCOPIC
Bilirubin, UA: NEGATIVE
Glucose, UA: NEGATIVE
Ketones, UA: NEGATIVE
Nitrite, UA: NEGATIVE
Specific Gravity, UA: 1.025 (ref 1.005–1.030)
Urobilinogen, Ur: 0.2 mg/dL (ref 0.2–1.0)
pH, UA: 5.5 (ref 5.0–7.5)

## 2023-06-07 LAB — MICROSCOPIC EXAMINATION: RBC, Urine: >30 /[HPF] — AB (ref 0–2)

## 2023-06-07 NOTE — Progress Notes (Signed)
History of Present Illness: Here for follow-up of hematuria, bladder abnormality.  9.30.2024: Initial visit following admission to the hospital for treatment of bacteremia, acute retention with bilateral hydronephrosis.  He was treated with Foley catheter placement.  He was started on tamsulosin.  CT scan was performed at the time of his presentation to the emergency room.  He had a cirrhotic liver, small volume ascites, bilateral hydroureteronephrosis to the bladder, small enhancing left-sided bladder wall abnormality.  Prior to presentation, he was on Flomax for about 2 to 3 years.  Despite that he is having a slow stream, feeling of incomplete emptying, urgency and occasional urgency incontinence.  He passed a TOV in the office. Followup bladder scan a week later revealed a pvr volume of 0.  10.21.2024: Here for cysto to f/u bladder abnormality on CT.  Cystoscopy revealed no significant urothelial lesion.  The patient does tell me that he had a bladder injury during his colon cancer surgery years ago.  10.29.2024: Here today for recheck.  Does complain of significant fatigue.  Occasional gross hematuria.  No pain with urination.  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 Lita Mains   PE (pulmonary embolism)    Temporarily on Eliquis 2017   Skin cancer    35 years ago   Type 2 diabetes mellitus (HCC)    Vitamin B 12 deficiency 07/10/2019    Past Surgical History:  Procedure Laterality Date   ABDOMINOPERINEAL PROCTOCOLECTOMY  2009   end colostomy    BIOPSY  08/25/2018   Procedure: BIOPSY;  Surgeon: Malissa Hippo, MD;  [facility];  Service: Endoscopy;;   BIOPSY  10/04/2021   Procedure: BIOPSY;   Surgeon: Malissa Hippo, MD;  [facility];  Service: Endoscopy;;  small, bowel   BLADDER REPAIR  08/29/2018   Procedure: BLADDER REPAIR;  Surgeon: Lucretia Roers, MD;  [facility]: [facility];  Service: General;;   COLECTOMY  2008   COLONOSCOPY WITH PROPOFOL N/A 08/25/2018   Procedure: COLONOSCOPY WITH PROPOFOL;  Surgeon: Malissa Hippo, MD;  [facility];  Service: Endoscopy;  Laterality: N/A;  2:20   COLOSTOMY     ESOPHAGOGASTRODUODENOSCOPY (EGD) WITH PROPOFOL N/A 05/10/2023   Procedure: ESOPHAGOGASTRODUODENOSCOPY (EGD) WITH PROPOFOL;  Surgeon: Dolores Frame, MD;  [facility];  Service: Gastroenterology;  Laterality: N/A;  11:00AM;ASA 3   EXPLORATORY LAPAROTOMY  2017   ? pneumoperitoneum of unknown etiology/ negative Ex lap   GIVENS CAPSULE STUDY  10/04/2021   Procedure: GIVENS CAPSULE STUDY;  Surgeon: Malissa Hippo, MD;  [facility];  Service: Endoscopy;;   ILEOSCOPY N/A 10/04/2021   Procedure: ILEOSCOPY THROUGH STOMA;  Surgeon: Malissa Hippo, MD;  [facility];  Service: Endoscopy;  Laterality: N/A;   ILEOSTOMY  08/29/2018   Procedure: ILEOSTOMY;  Surgeon: Lucretia Roers, MD;  [facility];  Service: General;;   LAPAROSCOPIC CHOLECYSTECTOMY  2007   LYSIS OF ADHESION  08/29/2018   Procedure: LYSIS OF ADHESION;  Surgeon: Lucretia Roers, MD;  [facility];  Service: General;;   PARTIAL COLECTOMY N/A 08/29/2018   Procedure: PARTIAL COLECTOMY;  Surgeon: Lucretia Roers, MD;  [facility];  Service: General;  Laterality: N/A;   PORTACATH PLACEMENT Left 10/04/2018   Procedure: INSERTION PORT-A-CATH (attached catheter left subclavian);  Surgeon: Lucretia Roers, MD;  [facility];  Service: General;  Laterality: Left;   SBO SURGERY  06/2009    Home Medications:  Allergies as of 06/07/2023   No Known Allergies      Medication List        Accurate as of June 07, 2023 12:31 PM. If you  have any questions, ask your nurse or doctor.          acetaminophen 325 MG suppository Commonly known as: TYLENOL Place 325 mg rectally every 4 (four) hours as needed for moderate pain.   carvedilol 3.125 MG tablet Commonly known as: Coreg Take 1 tablet (3.125 mg total) by mouth 2 (two) times daily with a meal.   doxycycline 100 MG capsule Commonly known as: VIBRAMYCIN Take 1 capsule (100 mg total) by mouth every 12 (twelve) hours.   furosemide 40 MG tablet Commonly known as: LASIX Take 20-40 mg by mouth daily as needed.   lactose free nutrition Liqd Take 237 mLs by mouth daily at 6 (six) AM.   levothyroxine 50 MCG tablet Commonly known as: SYNTHROID Take 50 mcg by mouth daily before breakfast.   loperamide 2 MG capsule Commonly known as: IMODIUM Take 2 capsules (4 mg total) by mouth 4 (four) times daily -  before meals and at bedtime.   OneTouch Verio test strip Generic drug: glucose blood 1 each 2 (two) times daily.   tamsulosin 0.4 MG Caps capsule Commonly known as: FLOMAX Take 1 capsule (0.4 mg total) by mouth daily after supper.   Vitamin D (Ergocalciferol) 1.25 MG (50000 UNIT) Caps capsule Commonly known as: DRISDOL Take 1 capsule by mouth once a week        Allergies: No Known Allergies  Family History  Problem Relation Age of Onset   Heart disease Father    Heart failure Father    Heart attack Father    Prostate cancer Paternal Uncle     Social History:  reports that he quit smoking about 32 years ago. His smoking use included cigarettes. He started smoking about 59 years ago. He has a 27 pack-year smoking history. He quit smokeless tobacco use about 27 years ago.  His smokeless tobacco use included chew. He reports that he does not drink alcohol and does not use drugs.  ROS: A complete review of systems was performed.  All systems are negative except for pertinent findings as noted.  Physical Exam:  Vital signs in last 24 hours: BP (!)  127/57   Pulse (!) 58  Constitutional:  Alert and oriented, No acute distress Cardiovascular: Regular rate  Respiratory: Normal respiratory effort GI: Abdomen is soft, nontender, nondistended, no abdominal masses. No CVAT.  Genitourinary: Normal male phallus, testes are descended bilaterally and non-tender and without masses, scrotum is normal in appearance without lesions or masses, perineum is normal on inspection. Lymphatic: No lymphadenopathy Neurologic: Grossly intact, no focal deficits Psychiatric: Normal mood and affect  I have reviewed prior pt notes  I have reviewed urinalysis results  I have independently reviewed prior imaging--CT images reviewed with patient and wife  I have reviewed prior results-last blood studies from mid September reviewed.  He did have a platelet count of 50,000 at that time (known thrombocytopenia)     Impression/Assessment:  Continued gross hematuria.  Normal upper  tracts.  Bladder abnormality on CT not seen on cystoscopy  Multiple other constitutional complaints.  He has not seen his PCP in a while  Plan:  1.  I did draw CMP as well as CBC today.  I will call with results as well as follow-up based on these  2.  I do not have a problem repeating cystoscopy if he persists with his hematuria, which may actually be due to his thrombocytopenia.

## 2023-06-07 NOTE — Progress Notes (Signed)
post void residual=75

## 2023-06-08 ENCOUNTER — Emergency Department (HOSPITAL_COMMUNITY): Payer: Medicare Other

## 2023-06-08 ENCOUNTER — Other Ambulatory Visit: Payer: Self-pay

## 2023-06-08 ENCOUNTER — Encounter (HOSPITAL_COMMUNITY): Payer: Self-pay | Admitting: Family Medicine

## 2023-06-08 ENCOUNTER — Inpatient Hospital Stay (HOSPITAL_COMMUNITY)
Admission: EM | Admit: 2023-06-08 | Discharge: 2023-06-10 | DRG: 872 | Disposition: A | Discharge status: Home / Self Care | Payer: Medicare Other | Attending: Family Medicine | Admitting: Family Medicine

## 2023-06-08 DIAGNOSIS — I4821 Permanent atrial fibrillation: Secondary | ICD-10-CM | POA: Diagnosis present

## 2023-06-08 DIAGNOSIS — Z8673 Personal history of transient ischemic attack (TIA), and cerebral infarction without residual deficits: Secondary | ICD-10-CM

## 2023-06-08 DIAGNOSIS — R17 Unspecified jaundice: Secondary | ICD-10-CM

## 2023-06-08 DIAGNOSIS — N3001 Acute cystitis with hematuria: Secondary | ICD-10-CM | POA: Diagnosis not present

## 2023-06-08 DIAGNOSIS — K7290 Hepatic failure, unspecified without coma: Secondary | ICD-10-CM | POA: Diagnosis present

## 2023-06-08 DIAGNOSIS — Z8042 Family history of malignant neoplasm of prostate: Secondary | ICD-10-CM | POA: Diagnosis not present

## 2023-06-08 DIAGNOSIS — R338 Other retention of urine: Secondary | ICD-10-CM | POA: Diagnosis not present

## 2023-06-08 DIAGNOSIS — E039 Hypothyroidism, unspecified: Secondary | ICD-10-CM | POA: Diagnosis not present

## 2023-06-08 DIAGNOSIS — R739 Hyperglycemia, unspecified: Secondary | ICD-10-CM

## 2023-06-08 DIAGNOSIS — E871 Hypo-osmolality and hyponatremia: Secondary | ICD-10-CM | POA: Diagnosis not present

## 2023-06-08 DIAGNOSIS — Z87891 Personal history of nicotine dependence: Secondary | ICD-10-CM | POA: Diagnosis not present

## 2023-06-08 DIAGNOSIS — J90 Pleural effusion, not elsewhere classified: Secondary | ICD-10-CM | POA: Diagnosis not present

## 2023-06-08 DIAGNOSIS — R7401 Elevation of levels of liver transaminase levels: Secondary | ICD-10-CM

## 2023-06-08 DIAGNOSIS — K7030 Alcoholic cirrhosis of liver without ascites: Secondary | ICD-10-CM | POA: Diagnosis not present

## 2023-06-08 DIAGNOSIS — N401 Enlarged prostate with lower urinary tract symptoms: Secondary | ICD-10-CM | POA: Diagnosis present

## 2023-06-08 DIAGNOSIS — Z85828 Personal history of other malignant neoplasm of skin: Secondary | ICD-10-CM | POA: Diagnosis not present

## 2023-06-08 DIAGNOSIS — Z1152 Encounter for screening for COVID-19: Secondary | ICD-10-CM

## 2023-06-08 DIAGNOSIS — R7881 Bacteremia: Secondary | ICD-10-CM | POA: Diagnosis present

## 2023-06-08 DIAGNOSIS — K7460 Unspecified cirrhosis of liver: Secondary | ICD-10-CM | POA: Diagnosis present

## 2023-06-08 DIAGNOSIS — N390 Urinary tract infection, site not specified: Secondary | ICD-10-CM | POA: Diagnosis present

## 2023-06-08 DIAGNOSIS — I517 Cardiomegaly: Secondary | ICD-10-CM | POA: Diagnosis not present

## 2023-06-08 DIAGNOSIS — Z1612 Extended spectrum beta lactamase (ESBL) resistance: Secondary | ICD-10-CM | POA: Diagnosis not present

## 2023-06-08 DIAGNOSIS — D539 Nutritional anemia, unspecified: Secondary | ICD-10-CM | POA: Diagnosis present

## 2023-06-08 DIAGNOSIS — R319 Hematuria, unspecified: Secondary | ICD-10-CM | POA: Diagnosis present

## 2023-06-08 DIAGNOSIS — B961 Klebsiella pneumoniae [K. pneumoniae] as the cause of diseases classified elsewhere: Secondary | ICD-10-CM | POA: Diagnosis present

## 2023-06-08 DIAGNOSIS — Z8249 Family history of ischemic heart disease and other diseases of the circulatory system: Secondary | ICD-10-CM | POA: Diagnosis not present

## 2023-06-08 DIAGNOSIS — Z85038 Personal history of other malignant neoplasm of large intestine: Secondary | ICD-10-CM | POA: Diagnosis not present

## 2023-06-08 DIAGNOSIS — N136 Pyonephrosis: Secondary | ICD-10-CM | POA: Diagnosis not present

## 2023-06-08 DIAGNOSIS — E1165 Type 2 diabetes mellitus with hyperglycemia: Secondary | ICD-10-CM | POA: Diagnosis not present

## 2023-06-08 DIAGNOSIS — R112 Nausea with vomiting, unspecified: Secondary | ICD-10-CM | POA: Diagnosis not present

## 2023-06-08 DIAGNOSIS — R748 Abnormal levels of other serum enzymes: Secondary | ICD-10-CM | POA: Diagnosis present

## 2023-06-08 DIAGNOSIS — Z7984 Long term (current) use of oral hypoglycemic drugs: Secondary | ICD-10-CM

## 2023-06-08 DIAGNOSIS — R509 Fever, unspecified: Secondary | ICD-10-CM | POA: Diagnosis not present

## 2023-06-08 DIAGNOSIS — R310 Gross hematuria: Secondary | ICD-10-CM | POA: Diagnosis not present

## 2023-06-08 DIAGNOSIS — D696 Thrombocytopenia, unspecified: Secondary | ICD-10-CM | POA: Diagnosis present

## 2023-06-08 DIAGNOSIS — Z9049 Acquired absence of other specified parts of digestive tract: Secondary | ICD-10-CM

## 2023-06-08 DIAGNOSIS — R7989 Other specified abnormal findings of blood chemistry: Secondary | ICD-10-CM | POA: Diagnosis present

## 2023-06-08 DIAGNOSIS — B9689 Other specified bacterial agents as the cause of diseases classified elsewhere: Secondary | ICD-10-CM | POA: Diagnosis present

## 2023-06-08 DIAGNOSIS — B951 Streptococcus, group B, as the cause of diseases classified elsewhere: Secondary | ICD-10-CM | POA: Diagnosis not present

## 2023-06-08 DIAGNOSIS — Z79899 Other long term (current) drug therapy: Secondary | ICD-10-CM | POA: Diagnosis not present

## 2023-06-08 DIAGNOSIS — Z79890 Hormone replacement therapy (postmenopausal): Secondary | ICD-10-CM

## 2023-06-08 DIAGNOSIS — D500 Iron deficiency anemia secondary to blood loss (chronic): Secondary | ICD-10-CM | POA: Diagnosis present

## 2023-06-08 DIAGNOSIS — A401 Sepsis due to streptococcus, group B: Principal | ICD-10-CM | POA: Diagnosis present

## 2023-06-08 DIAGNOSIS — Z933 Colostomy status: Secondary | ICD-10-CM | POA: Diagnosis not present

## 2023-06-08 DIAGNOSIS — N400 Enlarged prostate without lower urinary tract symptoms: Secondary | ICD-10-CM | POA: Diagnosis present

## 2023-06-08 DIAGNOSIS — D649 Anemia, unspecified: Secondary | ICD-10-CM | POA: Diagnosis not present

## 2023-06-08 DIAGNOSIS — Z86711 Personal history of pulmonary embolism: Secondary | ICD-10-CM

## 2023-06-08 LAB — RESP PANEL BY RT-PCR (RSV, FLU A&B, COVID)  RVPGX2
Influenza A by PCR: NEGATIVE
Influenza B by PCR: NEGATIVE
Resp Syncytial Virus by PCR: NEGATIVE
SARS Coronavirus 2 by RT PCR: NEGATIVE

## 2023-06-08 LAB — BLOOD CULTURE ID PANEL (REFLEXED) - BCID2

## 2023-06-08 LAB — CBC WITH DIFFERENTIAL/PLATELET
Abs Immature Granulocytes: 0.04 10*3/uL (ref 0.00–0.07)
Basophils Absolute: 0 10*3/uL (ref 0.0–0.1)
Basophils Relative: 0 %
Eosinophils Absolute: 0.2 10*3/uL (ref 0.0–0.5)
Eosinophils Relative: 2 %
HCT: 32.5 % — ABNORMAL LOW (ref 39.0–52.0)
Hemoglobin: 10.9 g/dL — ABNORMAL LOW (ref 13.0–17.0)
Immature Granulocytes: 0 %
Lymphocytes Relative: 6 %
Lymphs Abs: 0.6 10*3/uL — ABNORMAL LOW (ref 0.7–4.0)
MCH: 36.5 pg — ABNORMAL HIGH (ref 26.0–34.0)
MCHC: 33.5 g/dL (ref 30.0–36.0)
MCV: 108.7 fL — ABNORMAL HIGH (ref 80.0–100.0)
Monocytes Absolute: 0.6 10*3/uL (ref 0.1–1.0)
Monocytes Relative: 6 %
Neutro Abs: 9 10*3/uL — ABNORMAL HIGH (ref 1.7–7.7)
Neutrophils Relative %: 86 %
Platelets: 62 10*3/uL — ABNORMAL LOW (ref 150–400)
RBC: 2.99 MIL/uL — ABNORMAL LOW (ref 4.22–5.81)
RDW: 15.2 % (ref 11.5–15.5)
WBC: 10.4 10*3/uL (ref 4.0–10.5)
nRBC: 0 % (ref 0.0–0.2)

## 2023-06-08 LAB — COMPREHENSIVE METABOLIC PANEL
ALT: 34 [IU]/L (ref 0–44)
ALT: 40 U/L (ref 0–44)
AST: 63 [IU]/L — ABNORMAL HIGH (ref 0–40)
AST: 65 U/L — ABNORMAL HIGH (ref 15–41)
Albumin: 2.7 g/dL — ABNORMAL LOW (ref 3.8–4.8)
Albumin: 2.5 g/dL — ABNORMAL LOW (ref 3.5–5.0)
Alkaline Phosphatase: 147 [IU]/L — ABNORMAL HIGH (ref 44–121)
Alkaline Phosphatase: 128 U/L — ABNORMAL HIGH (ref 38–126)
Anion gap: 6 (ref 5–15)
BUN/Creatinine Ratio: 12 (ref 10–24)
BUN: 12 mg/dL (ref 8–27)
BUN: 14 mg/dL (ref 8–23)
Bilirubin Total: 2.8 mg/dL — ABNORMAL HIGH (ref 0.0–1.2)
CO2: 24 mmol/L (ref 20–29)
CO2: 23 mmol/L (ref 22–32)
Calcium: 8.8 mg/dL (ref 8.6–10.2)
Calcium: 8.4 mg/dL — ABNORMAL LOW (ref 8.9–10.3)
Chloride: 106 mmol/L (ref 96–106)
Chloride: 103 mmol/L (ref 98–111)
Creatinine, Ser: 0.97 mg/dL (ref 0.76–1.27)
Creatinine, Ser: 1.09 mg/dL (ref 0.61–1.24)
GFR, Estimated: >60 mL/min (ref >60)
Globulin, Total: 3.8 g/dL (ref 1.5–4.5)
Glucose, Bld: 175 mg/dL — ABNORMAL HIGH (ref 70–99)
Glucose: 120 mg/dL — ABNORMAL HIGH (ref 70–99)
Potassium: 5.1 mmol/L (ref 3.5–5.2)
Potassium: 4.4 mmol/L (ref 3.5–5.1)
Sodium: 137 mmol/L (ref 134–144)
Sodium: 132 mmol/L — ABNORMAL LOW (ref 135–145)
Total Bilirubin: 3.9 mg/dL — ABNORMAL HIGH (ref 0.3–1.2)
Total Protein: 6.5 g/dL (ref 6.0–8.5)
Total Protein: 7.1 g/dL (ref 6.5–8.1)
eGFR: 79 mL/min/{1.73_m2} (ref >59)

## 2023-06-08 LAB — URINALYSIS, ROUTINE W REFLEX MICROSCOPIC
Bilirubin Urine: NEGATIVE
Glucose, UA: 50 mg/dL — AB
Ketones, ur: NEGATIVE mg/dL
Nitrite: NEGATIVE
Protein, ur: NEGATIVE mg/dL
RBC / HPF: >50 RBC/hpf (ref 0–5)
Specific Gravity, Urine: 1.017 (ref 1.005–1.030)
pH: 5 (ref 5.0–8.0)

## 2023-06-08 LAB — PROTIME-INR
INR: 2.3 — ABNORMAL HIGH (ref 0.8–1.2)
Prothrombin Time: 25.4 s — ABNORMAL HIGH (ref 11.4–15.2)

## 2023-06-08 LAB — CBC
Hematocrit: 31.4 % — ABNORMAL LOW (ref 37.5–51.0)
Hemoglobin: 10.9 g/dL — ABNORMAL LOW (ref 13.0–17.7)
MCH: 36.1 pg — ABNORMAL HIGH (ref 26.6–33.0)
MCHC: 34.7 g/dL (ref 31.5–35.7)
MCV: 104 fL — ABNORMAL HIGH (ref 79–97)
Platelets: 59 10*3/uL — CL (ref 150–450)
RBC: 3.02 x10E6/uL — ABNORMAL LOW (ref 4.14–5.80)
RDW: 13.1 % (ref 11.6–15.4)
WBC: 4.7 10*3/uL (ref 3.4–10.8)

## 2023-06-08 LAB — HEMOGLOBIN A1C
Hgb A1c MFr Bld: 6.2 % — ABNORMAL HIGH (ref 4.8–5.6)
Mean Plasma Glucose: 131.24 mg/dL

## 2023-06-08 LAB — LACTIC ACID, PLASMA
Lactic Acid, Venous: 1.8 mmol/L (ref 0.5–1.9)
Lactic Acid, Venous: 1.8 mmol/L (ref 0.5–1.9)

## 2023-06-08 LAB — GLUCOSE, CAPILLARY: Glucose-Capillary: 152 mg/dL — ABNORMAL HIGH (ref 70–99)

## 2023-06-08 LAB — APTT: aPTT: 39 s — ABNORMAL HIGH (ref 24–36)

## 2023-06-08 MED ORDER — TAMSULOSIN HCL 0.4 MG PO CAPS
0.4000 mg | ORAL_CAPSULE | Freq: Every day | ORAL | Status: DC
  Administered 2023-06-08 – 2023-06-09 (×2): 0.4 mg via ORAL
  Filled 2023-06-08 (×2): qty 1

## 2023-06-08 MED ORDER — ACETAMINOPHEN 325 MG PO TABS: 650.0000 mg | ORAL_TABLET | Freq: Four times a day (QID) | ORAL | Status: DC | PRN

## 2023-06-08 MED ORDER — LOPERAMIDE HCL 2 MG PO CAPS
4.0000 mg | ORAL_CAPSULE | Freq: Three times a day (TID) | ORAL | Status: DC
  Administered 2023-06-08 – 2023-06-10 (×6): 4 mg via ORAL
  Filled 2023-06-08 (×6): qty 2

## 2023-06-08 MED ORDER — SODIUM CHLORIDE 0.9 % IV SOLN: 1.0000 g | INTRAVENOUS | Status: DC

## 2023-06-08 MED ORDER — INSULIN ASPART 100 UNIT/ML IJ SOLN: 0.0000 [IU] | Freq: Three times a day (TID) | INTRAMUSCULAR | Status: DC

## 2023-06-08 MED ORDER — ONDANSETRON HCL 4 MG/2ML IJ SOLN: 4.0000 mg | Freq: Four times a day (QID) | INTRAMUSCULAR | Status: DC | PRN

## 2023-06-08 MED ORDER — ONDANSETRON HCL 4 MG/2ML IJ SOLN
4.0000 mg | Freq: Once | INTRAMUSCULAR | Status: AC
  Administered 2023-06-08: 4 mg via INTRAVENOUS
  Filled 2023-06-08: qty 2

## 2023-06-08 MED ORDER — TRAZODONE HCL 50 MG PO TABS
50.0000 mg | ORAL_TABLET | Freq: Every evening | ORAL | Status: DC | PRN
  Administered 2023-06-08: 50 mg via ORAL
  Filled 2023-06-08 (×2): qty 1

## 2023-06-08 MED ORDER — LACTATED RINGERS IV BOLUS
1000.0000 mL | Freq: Once | INTRAVENOUS | Status: AC
  Administered 2023-06-08: 1000 mL via INTRAVENOUS

## 2023-06-08 MED ORDER — SODIUM CHLORIDE 0.9% FLUSH
3.0000 mL | Freq: Two times a day (BID) | INTRAVENOUS | Status: DC
  Administered 2023-06-08 – 2023-06-09 (×2): 3 mL via INTRAVENOUS

## 2023-06-08 MED ORDER — SODIUM CHLORIDE 0.9 % IV SOLN: INTRAVENOUS | Status: DC | PRN

## 2023-06-08 MED ORDER — SODIUM CHLORIDE 0.9% FLUSH
3.0000 mL | Freq: Two times a day (BID) | INTRAVENOUS | Status: DC
  Administered 2023-06-08 – 2023-06-09 (×3): 3 mL via INTRAVENOUS

## 2023-06-08 MED ORDER — INFLUENZA VAC A&B SURF ANT ADJ 0.5 ML IM SUSY: 0.5000 mL | PREFILLED_SYRINGE | INTRAMUSCULAR | Status: DC

## 2023-06-08 MED ORDER — ACETAMINOPHEN 650 MG RE SUPP: 650.0000 mg | Freq: Four times a day (QID) | RECTAL | Status: DC | PRN

## 2023-06-08 MED ORDER — BISACODYL 10 MG RE SUPP: 10.0000 mg | Freq: Every day | RECTAL | Status: DC | PRN

## 2023-06-08 MED ORDER — PNEUMOCOCCAL 20-VAL CONJ VACC 0.5 ML IM SUSY: 0.5000 mL | PREFILLED_SYRINGE | INTRAMUSCULAR | Status: DC

## 2023-06-08 MED ORDER — CARVEDILOL 3.125 MG PO TABS
3.1250 mg | ORAL_TABLET | Freq: Two times a day (BID) | ORAL | Status: DC
  Administered 2023-06-08 – 2023-06-10 (×4): 3.125 mg via ORAL
  Filled 2023-06-08 (×4): qty 1

## 2023-06-08 MED ORDER — POLYETHYLENE GLYCOL 3350 17 G PO PACK: 17.0000 g | PACK | Freq: Every day | ORAL | Status: DC | PRN

## 2023-06-08 MED ORDER — HEPARIN SODIUM (PORCINE) 5000 UNIT/ML IJ SOLN
5000.0000 [IU] | Freq: Three times a day (TID) | INTRAMUSCULAR | Status: DC
  Administered 2023-06-08 – 2023-06-09 (×3): 5000 [IU] via SUBCUTANEOUS
  Filled 2023-06-08 (×3): qty 1

## 2023-06-08 MED ORDER — INSULIN ASPART 100 UNIT/ML IJ SOLN: 0.0000 [IU] | Freq: Every day | INTRAMUSCULAR | Status: DC

## 2023-06-08 MED ORDER — LEVOTHYROXINE SODIUM 50 MCG PO TABS
50.0000 ug | ORAL_TABLET | Freq: Every day | ORAL | Status: DC
  Administered 2023-06-09 – 2023-06-10 (×2): 50 ug via ORAL
  Filled 2023-06-08 (×2): qty 1

## 2023-06-08 MED ORDER — ONDANSETRON HCL 4 MG PO TABS: 4.0000 mg | ORAL_TABLET | Freq: Four times a day (QID) | ORAL | Status: DC | PRN

## 2023-06-08 MED ORDER — SODIUM CHLORIDE 0.9% FLUSH
3.0000 mL | INTRAVENOUS | Status: DC | PRN
Start: 2023-06-08 — End: 2023-06-09

## 2023-06-08 MED ORDER — SODIUM CHLORIDE 0.9 % IV SOLN
2.0000 g | Freq: Once | INTRAVENOUS | Status: AC
Start: 2023-06-08 — End: 2023-06-08
  Administered 2023-06-08: 2 g via INTRAVENOUS
  Filled 2023-06-08: qty 20

## 2023-06-08 NOTE — H&P (Addendum)
Patient Demographics:    Jose Lawrence, is a 82 y.o. male  MRN: [id number]   DOB - 11-Apr-1941  Admit Date - 06/08/2023  Outpatient Primary MD for the patient is Ignatius Specking, MD   Assessment & Plan:   Assessment and Plan:  1)Fevers in the setting of persistent hematuria--suspect this is due to UTI -IV Rocephin pending culture data -Recent admission for sepsis secondary to urinary source  2)Persistent Hematuria--patient had cystoscopy on 05/30/2023 with no significant urothelial lesion -Patient was seen by urologist Dr. Retta Diones for follow-up on 06/07/2023--- due to persistent hematuria -Hopefully will Not need for CBI -Continue Flomax  3)Acute Urinary retention with hydroureteronephrosis  Secondary to BPH -Managed by urology -Please see #2 above   4) chronic anemia and thrombocytopenia--- suspect this is due to underlying liver cirrhosis -Both hemoglobin and platelets appear to be close to recent baseline -Monitor H&H closely in the setting of persistent hematuria  5)Cirrhosis of liver  Elevated AST in relation to ALT suggesting alcohol related.  Clinically with no significant ascites.  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 Last follow up with no recurrence.    7)Atrial fibrillation, chronic -Continue Coreg for rate control,  not on anticoagulation-due to bleeding concerns in the setting of persistent hematuria and persistent thrombocytopenia and anemia due to cirrhosis  8)Controlled type 2 diabetes mellitus without complication  Diet controlled at this time   9)Hypothyroidism Continue with levothyroxine.   10)DM2--history of uncontrolled DM -Hold glipizide Use  Novolog/Humalog Sliding scale insulin with Accu-Cheks/Fingersticks as ordered   Status is: Inpatient  Remains inpatient appropriate because:   Dispo: The patient is from: Home              Anticipated d/c is to: Home              Anticipated d/c date is: 2 days              Patient currently is not medically stable to d/c. Barriers: Not Clinically Stable-   With History of - Reviewed by me  Past Medical History:  Diagnosis Date   Anemia    Causing interruption in anticoagulation   Atrial fibrillation and flutter (HCC)    BPH (benign prostatic hypertrophy)    Colon cancer (HCC)    Status post LAR 2008   Dysrhythmia    Hypothyroidism    Lacunar infarction (HCC) 11/2012   RT 3rd NERVE PALSY, SB Dr. Lita Mains   PE (pulmonary embolism)    Temporarily on Eliquis 2017   Skin cancer    35 years ago   Type 2 diabetes mellitus (HCC)    Vitamin B 12 deficiency 07/10/2019      Past Surgical History:  Procedure Laterality Date   ABDOMINOPERINEAL PROCTOCOLECTOMY  2009   end colostomy    BIOPSY  08/25/2018   Procedure: BIOPSY;  Surgeon: Malissa Hippo, MD;  [facility];  Service: Endoscopy;;   BIOPSY  10/04/2021   Procedure: BIOPSY;  Surgeon: Malissa Hippo, MD;  [facility];  Service: Endoscopy;;  small, bowel   BLADDER REPAIR  08/29/2018   Procedure: BLADDER REPAIR;  Surgeon: Lucretia Roers, MD;  [facility];  Service: General;;   COLECTOMY  2008   COLONOSCOPY WITH PROPOFOL N/A 08/25/2018   Procedure: COLONOSCOPY WITH PROPOFOL;  Surgeon: Malissa Hippo, MD;  [facility];  Service: Endoscopy;  Laterality: N/A;  2:20   COLOSTOMY     ESOPHAGOGASTRODUODENOSCOPY (EGD) WITH PROPOFOL N/A 05/10/2023   Procedure: ESOPHAGOGASTRODUODENOSCOPY (EGD) WITH PROPOFOL;  Surgeon: Dolores Frame, MD;  [facility];  Service: Gastroenterology;  Laterality: N/A;  11:00AM;ASA 3   EXPLORATORY LAPAROTOMY  2017   ? pneumoperitoneum of  unknown etiology/ negative Ex lap   GIVENS CAPSULE STUDY  10/04/2021   Procedure: GIVENS CAPSULE STUDY;  Surgeon: Malissa Hippo, MD;  [facility];  Service: Endoscopy;;   ILEOSCOPY N/A 10/04/2021   Procedure: ILEOSCOPY THROUGH STOMA;  Surgeon: Malissa Hippo, MD;  [facility];  Service: Endoscopy;  Laterality: N/A;   ILEOSTOMY  08/29/2018   Procedure: ILEOSTOMY;  Surgeon: Lucretia Roers, MD;  [facility];  Service: General;;   LAPAROSCOPIC CHOLECYSTECTOMY  2007   LYSIS OF ADHESION  08/29/2018   Procedure: LYSIS OF ADHESION;  Surgeon: Lucretia Roers, MD;  [facility];  Service: General;;   PARTIAL COLECTOMY N/A 08/29/2018   Procedure: PARTIAL COLECTOMY;  Surgeon: Lucretia Roers, MD;  [facility];  Service: General;  Laterality: N/A;   PORTACATH PLACEMENT Left 10/04/2018   Procedure: INSERTION PORT-A-CATH (attached catheter left subclavian);  Surgeon: Lucretia Roers, MD;  [facility];  Service: General;  Laterality: Left;   SBO SURGERY  06/2009    Chief Complaint  Patient presents with   Fever      HPI:    [name]  is a 82 y.o. 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 bili 3.9 alk phos 128 -UA with glucose, Hgb, leukocyte esterase, and bacteria as well as up to 10 WBCs -Hemoglobin 10.9, WBC 10.4 platelets 62K -INR 2.3 -Lactic acid 1.8 -COVID, RSV and flu  negative -Chest x-ray without acute cardiopulmonary findings  Review of systems:    In addition to the HPI above,   A full Review of  Systems was done, all other systems reviewed are negative except as noted above in HPI , .    Social History:  Reviewed by me    Social History   Tobacco Use   Smoking status: Former    Current packs/day: 0.00    Average packs/day: 1 pack/day for 27.0 years (27.0 ttl pk-yrs)    Types: Cigarettes    Start date: 01/27/1964    Quit date: 01/27/1991    Years since quitting: 32.3  Smokeless tobacco: Former    Types: Chew    Quit date: 08/10/1995  Substance Use Topics   Alcohol use: Never    Alcohol/week: 0.0 standard drinks of alcohol   Family History :  Reviewed by me    Family History  Problem Relation Age of Onset   Heart disease Father    Heart failure Father    Heart attack Father    Prostate cancer Paternal Uncle     Home Medications:   Prior to Admission medications   Medication Sig Start Date End Date Taking? Authorizing Provider  carvedilol (COREG) 3.125 MG tablet Take 1 tablet (3.125 mg total) by mouth 2 (two) times daily with a meal. 05/10/23  Yes Marguerita Merles, Daniel, MD  furosemide (LASIX) 40 MG tablet Take 20-40 mg by mouth daily as needed.   Yes [provider]  glipiZIDE (GLUCOTROL) 5 MG tablet Take 10 mg by mouth every morning. 05/04/23  Yes [provider]  lactose free nutrition (BOOST) LIQD Take 237 mLs by mouth daily at 6 (six) AM.   Yes [provider]  levothyroxine (SYNTHROID, LEVOTHROID) 50 MCG tablet Take 50 mcg by mouth daily before breakfast.   Yes [provider]  loperamide (IMODIUM) 2 MG capsule Take 2 capsules (4 mg total) by mouth 4 (four) times daily -  before meals and at bedtime. 11/11/18  Yes Lucretia Roers, MD  [facility] VERIO test strip 1 each 2 (two) times daily. 01/21/22  Yes [provider]  potassium chloride (KLOR-CON) 10 MEQ tablet Take 10 mEq by  mouth daily as needed. 05/09/23  Yes [provider]  tamsulosin (FLOMAX) 0.4 MG CAPS capsule Take 1 capsule (0.4 mg total) by mouth daily after supper. 10/04/21  Yes Adelyn Roscher, MD  Vitamin D, Ergocalciferol, (DRISDOL) 1.25 MG (50000 UNIT) CAPS capsule Take 1 capsule by mouth once a week 05/18/23  Yes Doreatha Massed, MD  acetaminophen (TYLENOL) 325 MG suppository Place 325 mg rectally every 4 (four) hours as needed for moderate pain. Patient not taking: Reported on 05/30/2023    [provider]  doxycycline (VIBRAMYCIN) 100 MG capsule Take 1 capsule (100 mg total) by mouth every 12 (twelve) hours. Patient not taking: Reported on 06/08/2023 05/31/23   Marcine Matar, MD     Allergies:    No Known Allergies   Physical Exam:   Vitals  Blood pressure (!) 133/58, pulse 64, temperature 98.9 F (37.2 C), temperature source Oral, resp. rate 18, height 6' (1.829 m), weight 89.4 kg, SpO2 99%.  Physical Examination: General appearance - alert,  in no distress Mental status - alert, oriented to person, place, and time,  Eyes - sclera anicteric Neck - supple, no JVD elevation , Chest - clear  to auscultation bilaterally, symmetrical air movement,  Heart - S1 and S2 normal, irregularly irregular Abdomen - soft, nontender, nondistended, +BS, ostomy bag with brown stool, no CVA tenderness Neurological - screening mental status exam normal, neck supple without rigidity, cranial nerves II through XII intact, DTR's normal and symmetric Extremities - no pedal edema noted, intact peripheral pulses  Skin - warm, dry     Data Review:    CBC Recent Labs  Lab 06/07/23 1240 06/08/23 0637  WBC 4.7 10.4  HGB 10.9* 10.9*  HCT 31.4* 32.5*  PLT 59* 62*  MCV 104* 108.7*  MCH 36.1* 36.5*  MCHC 34.7 33.5  RDW 13.1 15.2  LYMPHSABS  --  0.6*  MONOABS  --  0.6  EOSABS  --  0.2  BASOSABS  --  0.0    ------------------------------------------------------------------------------------------------------------------  Chemistries  Recent Labs  Lab 06/07/23 1240 06/08/23 0626  NA 137 132*  K 5.1 4.4  CL 106 103  CO2 24 23  GLUCOSE 120* 175*  BUN 12 14  CREATININE 0.97 1.09  CALCIUM 8.8 8.4*  AST 63* 65*  ALT 34 40  ALKPHOS 147* 128*  BILITOT 2.8* 3.9*   ------------------------------------------------------------------------------------------------------------------ estimated creatinine clearance is 59.3 mL/min (by C-G formula based on SCr of 1.09 mg/dL). ------------------------------------------------------------------------------------------------------------------  Coagulation profile Recent Labs  Lab 06/08/23 0729  INR 2.3*   ------------------------------------------------------------------------------------------------------------------rinalysis    Component Value Date/Time   COLORURINE AMBER (A) 06/08/2023 0618   APPEARANCEUR HAZY (A) 06/08/2023 0618   APPEARANCEUR Cloudy (A) 06/07/2023 1116   LABSPEC 1.017 06/08/2023 0618   PHURINE 5.0 06/08/2023 0618   GLUCOSEU 50 (A) 06/08/2023 0618   HGBUR LARGE (A) 06/08/2023 0618   BILIRUBINUR NEGATIVE 06/08/2023 0618   BILIRUBINUR Negative 06/07/2023 1116   KETONESUR NEGATIVE 06/08/2023 0618   PROTEINUR NEGATIVE 06/08/2023 0618   NITRITE NEGATIVE 06/08/2023 0618   LEUKOCYTESUR TRACE (A) 06/08/2023 0618    ----------------------------------------------------------------------------------------------------------------   Imaging Results:    DG Chest Port 1 View  Result Date: 06/08/2023 CLINICAL DATA:  Questionable sepsis. Assess for infiltrate. Fever, nausea and vomiting. EXAM: PORTABLE CHEST 1 VIEW COMPARISON:  Portable chest 04/22/2023 FINDINGS: Left subclavian port catheter again terminates in the mid SVC. There is mild cardiomegaly, interval improvement in central vascular prominence. No interstitial edema is  seen. There are chronic interstitial changes in both bases. No focal pneumonia is evident. There are bilateral minimal pleural effusions, similar to previous study. The mediastinum is stable. There is a small hiatal hernia. Mild aortic calcifications. No new osseous findings.  Osteopenia and thoracic spondylosis. IMPRESSION: 1. No evidence of pneumonia. 2. Mild cardiomegaly with interval improvement in central vascular prominence. 3. Minimal bilateral pleural effusions, similar to previous study. 4. Small hiatal hernia. Electronically Signed   By: Almira Bar M.D.   On: 06/08/2023 07:28    Radiological Exams on Admission: DG Chest Port 1 View  Result Date: 06/08/2023 CLINICAL DATA:  Questionable sepsis. Assess for infiltrate. Fever, nausea and vomiting. EXAM: PORTABLE CHEST 1 VIEW COMPARISON:  Portable chest 04/22/2023 FINDINGS: Left subclavian port catheter again terminates in the mid SVC. There is mild cardiomegaly, interval improvement in central vascular prominence. No interstitial edema is seen. There are chronic interstitial changes in both bases. No focal pneumonia is evident. There are bilateral minimal pleural effusions, similar to previous study. The mediastinum is stable. There is a small hiatal hernia. Mild aortic calcifications. No new osseous findings.  Osteopenia and thoracic spondylosis. IMPRESSION: 1. No evidence of pneumonia. 2. Mild cardiomegaly with interval improvement in central vascular prominence. 3. Minimal bilateral pleural effusions, similar to previous study. 4. Small hiatal hernia. Electronically Signed   By: Almira Bar M.D.   On: 06/08/2023 07:28    DVT Prophylaxis -SCD   AM Labs Ordered, also please review Full Orders  Family Communication: Admission, patients condition and plan of care including tests being ordered have been discussed with the patient and wife who indicate understanding and agree with the plan   Condition -stable  Shon Hale M.D on  06/08/2023 at 6:25 PM Go to [email or web address] -  for contact info  [facility] - Office  [phone]

## 2023-06-08 NOTE — Plan of Care (Signed)
  Problem: Education: Goal: Knowledge of General Education information will improve Description: Including pain rating scale, medication(s)/side effects and non-pharmacologic comfort measures Outcome: Progressing   Problem: Clinical Measurements: Goal: Cardiovascular complication will be avoided Outcome: Progressing   Problem: Clinical Measurements: Goal: Respiratory complications will improve Outcome: Progressing   Problem: Clinical Measurements: Goal: Diagnostic test results will improve Outcome: Progressing   Problem: Nutrition: Goal: Adequate nutrition will be maintained Outcome: Progressing   Problem: Coping: Goal: Level of anxiety will decrease Outcome: Progressing

## 2023-06-08 NOTE — Plan of Care (Signed)

## 2023-06-08 NOTE — ED Triage Notes (Signed)
Pt c/o fever and N/V that started last night. Fever of 103.0 at home, given tylenol PTA but was unable to keep it down. Pt also states he has had hematuria  x3 days.

## 2023-06-08 NOTE — Progress Notes (Signed)
Critical called by lab at 2149. Gram pos cocci in aerobic bottle. Patient currently on rocephin. MD notified at 2152.

## 2023-06-08 NOTE — ED Provider Notes (Signed)
[place] EMERGENCY DEPARTMENT AT [facility] Provider Note   CSN: [id number] Arrival date & time: 06/08/23  4098     History  Chief Complaint  Patient presents with   Fever    Jose Lawrence is a 82 y.o. male.  The history is provided by the patient and a relative.  Fever He has history of diabetes, colon cancer, cirrhosis, hypothyroidism, pulmonary embolism and comes in because of chills and fever.  He had been admitted to the hospital approximately 6 weeks ago with sepsis from a UTI.  He started having chills yesterday, but they got very severe during the night.  He had temperature which went up as high as 103.  There has been associated nausea and vomiting.  His wife gave him acetaminophen, but he vomited just after he took the medication.  He denies sore throat, states there is minimal cough.  He denies any diarrhea.  He has not had any new urinary symptoms.   Home Medications Prior to Admission medications   Medication Sig Start Date End Date Taking? Authorizing Provider  acetaminophen (TYLENOL) 325 MG suppository Place 325 mg rectally every 4 (four) hours as needed for moderate pain. Patient not taking: Reported on 05/30/2023    [provider]  carvedilol (COREG) 3.125 MG tablet Take 1 tablet (3.125 mg total) by mouth 2 (two) times daily with a meal. 05/10/23   Marguerita Merles, Reuel Boom, MD  doxycycline (VIBRAMYCIN) 100 MG capsule Take 1 capsule (100 mg total) by mouth every 12 (twelve) hours. 05/31/23   Marcine Matar, MD  furosemide (LASIX) 40 MG tablet Take 20-40 mg by mouth daily as needed.    [provider]  lactose free nutrition (BOOST) LIQD Take 237 mLs by mouth daily at 6 (six) AM.    [provider]  levothyroxine (SYNTHROID, LEVOTHROID) 50 MCG tablet Take 50 mcg by mouth daily before breakfast.    [provider]  loperamide (IMODIUM) 2 MG capsule Take 2 capsules (4 mg total) by mouth 4 (four) times daily -   before meals and at bedtime. 11/11/18   Lucretia Roers, MD  [facility] VERIO test strip 1 each 2 (two) times daily. 01/21/22   [provider]  tamsulosin (FLOMAX) 0.4 MG CAPS capsule Take 1 capsule (0.4 mg total) by mouth daily after supper. 10/04/21   Shon Hale, MD  Vitamin D, Ergocalciferol, (DRISDOL) 1.25 MG (50000 UNIT) CAPS capsule Take 1 capsule by mouth once a week 05/18/23   Doreatha Massed, MD      Allergies    Patient has no known allergies.    Review of Systems   Review of Systems  Constitutional:  Positive for fever.  All other systems reviewed and are negative.   Physical Exam Updated Vital Signs BP 134/60   Pulse 69   Temp 100.3 F (37.9 C) (Oral)   Resp 18   SpO2 98%  Physical Exam Vitals and nursing note reviewed.   82 year old male, resting comfortably and in no acute distress. Vital signs are significant for borderline fever (presumed to be fever since he had received acetaminophen at home and is known to have had a fever at home). Oxygen saturation is 98%, which is normal. Head is normocephalic and atraumatic. PERRLA, EOMI. Oropharynx is clear. Neck is nontender and supple without adenopathy or JVD. Back is nontender and there is no CVA tenderness. Lungs are clear without rales, wheezes, or rhonchi. Chest is nontender. Heart has regular rate and  rhythm without murmur. Abdomen is soft, flat, nontender. Extremities have 1+ edema, full range of motion is present. Skin is warm and dry without rash. Neurologic: Mental status is normal, cranial nerves are intact, moves all extremities equally.  ED Results / Procedures / Treatments   Labs (all labs ordered are listed, but only abnormal results are displayed) Labs Reviewed  URINALYSIS, ROUTINE W REFLEX MICROSCOPIC    EKG None  Radiology No results found.  Procedures Procedures  Cardiac monitor shows normal sinus rhythm, per my interpretation.  Medications Ordered in ED Medications  - No data to display  ED Course/ Medical Decision Making/ A&P                                 Medical Decision Making Amount and/or Complexity of Data Reviewed Labs: ordered. Radiology: ordered. ECG/medicine tests: ordered.  Risk Prescription drug management. Decision regarding hospitalization.   Fever and chills concerning for sepsis.  History of UTI with sepsis and known history of BPH which would put him at risk for recurrent UTI with sepsis.  No other obvious source for fever.  I have ordered patient be placed on the evolving sepsis pathway.  At this point, he does not show signs of severe sepsis and I am waiting for his evaluation to indicate source of infection.  I have ordered IV fluids and ondansetron for nausea.  I have reviewed his past records, and he was admitted 04/22/2023-04/24/2023 for urinary tract infection with sepsis.  I reviewed his laboratory test, and my interpretation is hematuria without evidence of UTI, elevated random glucose level, elevated alkaline phosphatase level, elevated AST, mild hyponatremia, normal lactic acid level, stable macrocytic anemia, stable thrombocytopenia.  Chest x-ray shows no evidence of pneumonia.  I have independently viewed the images, and agree with the radiologist's interpretation.  At this point, I I am still concerned about possible severe infection but do not see an obvious source.  I do not feel he is stable enough for discharge.  I have paged the hospitalist to arrange hospital admission.  CRITICAL CARE Performed by: Dione Booze Total critical care time: 40 minutes Critical care time was exclusive of separately billable procedures and treating other patients. Critical care was necessary to treat or prevent imminent or life-threatening deterioration. Critical care was time spent personally by me on the following activities: development of treatment plan with patient and/or surrogate as well as nursing, discussions with consultants,  evaluation of patient's response to treatment, examination of patient, obtaining history from patient or surrogate, ordering and performing treatments and interventions, ordering and review of laboratory studies, ordering and review of radiographic studies, pulse oximetry and re-evaluation of patient's condition.  Final Clinical Impression(s) / ED Diagnoses Final diagnoses:  Fever in adult  Macrocytic anemia  Hyponatremia  Elevated random blood glucose level  Alkaline phosphatase elevation  Elevated AST (SGOT)  Serum total bilirubin elevated    Rx / DC Orders ED Discharge Orders     None         Dione Booze, MD 06/08/23 [phone]

## 2023-06-09 ENCOUNTER — Other Ambulatory Visit (HOSPITAL_COMMUNITY): Payer: Self-pay | Admitting: *Deleted

## 2023-06-09 ENCOUNTER — Inpatient Hospital Stay (HOSPITAL_COMMUNITY): Payer: Medicare Other

## 2023-06-09 DIAGNOSIS — R7881 Bacteremia: Secondary | ICD-10-CM

## 2023-06-09 DIAGNOSIS — N3001 Acute cystitis with hematuria: Secondary | ICD-10-CM

## 2023-06-09 DIAGNOSIS — B951 Streptococcus, group B, as the cause of diseases classified elsewhere: Secondary | ICD-10-CM

## 2023-06-09 DIAGNOSIS — B9689 Other specified bacterial agents as the cause of diseases classified elsewhere: Secondary | ICD-10-CM | POA: Diagnosis present

## 2023-06-09 DIAGNOSIS — I4821 Permanent atrial fibrillation: Secondary | ICD-10-CM | POA: Diagnosis not present

## 2023-06-09 DIAGNOSIS — R509 Fever, unspecified: Secondary | ICD-10-CM | POA: Diagnosis not present

## 2023-06-09 DIAGNOSIS — K7460 Unspecified cirrhosis of liver: Secondary | ICD-10-CM

## 2023-06-09 LAB — GLUCOSE, CAPILLARY
Glucose-Capillary: 111 mg/dL — ABNORMAL HIGH (ref 70–99)
Glucose-Capillary: 144 mg/dL — ABNORMAL HIGH (ref 70–99)
Glucose-Capillary: 130 mg/dL — ABNORMAL HIGH (ref 70–99)
Glucose-Capillary: 165 mg/dL — ABNORMAL HIGH (ref 70–99)

## 2023-06-09 LAB — ECHOCARDIOGRAM COMPLETE
Area-P 1/2: 2.2 cm2
Height: 72 in
S' Lateral: 3 cm
Weight: 3152 [oz_av]

## 2023-06-09 MED ORDER — PENICILLIN G POTASSIUM 20000000 UNITS IJ SOLR
12.0000 10*6.[IU] | Freq: Two times a day (BID) | INTRAVENOUS | Status: DC
  Filled 2023-06-09: qty 12

## 2023-06-09 MED ORDER — SODIUM CHLORIDE 0.9% FLUSH
3.0000 mL | Freq: Two times a day (BID) | INTRAVENOUS | Status: DC
  Administered 2023-06-09 – 2023-06-10 (×2): 3 mL via INTRAVENOUS

## 2023-06-09 MED ORDER — SODIUM CHLORIDE 0.9 % IV SOLN
2.0000 g | INTRAVENOUS | Status: DC
  Administered 2023-06-09: 2 g via INTRAVENOUS
  Filled 2023-06-09: qty 20

## 2023-06-09 MED ORDER — SODIUM CHLORIDE 0.9 % IV SOLN
2.0000 g | INTRAVENOUS | Status: DC
  Filled 2023-06-09: qty 20

## 2023-06-09 NOTE — Progress Notes (Addendum)
Healthcare Providers:  [employer]  This test is no t yet approved or cleared by the [place] and  has been authorized for detection and/or diagnosis of SARS-CoV-2 by FDA under an Emergency Use Authorization (EUA). This EUA will remain  in effect (meaning this test can be used) for the duration of the COVID-19 declaration under Section 564(b)(1) of the Act, 21 U.S.C.section  360bbb-3(b)(1), unless the authorization is terminated  or revoked sooner.       Influenza A by PCR NEGATIVE NEGATIVE Final   Influenza B by PCR NEGATIVE NEGATIVE Final    Comment: (NOTE) The Xpert Xpress SARS-CoV-2/FLU/RSV plus assay is intended as an aid in the diagnosis of influenza from Nasopharyngeal swab specimens and should not be used as a sole basis for treatment. Nasal washings and aspirates are unacceptable for Xpert Xpress SARS-CoV-2/FLU/RSV testing.  Fact Sheet for Patients: BloggerCourse.com  Fact Sheet for Healthcare Providers: [employer]  This test is not yet approved or cleared by the [place] FDA and has been authorized for detection and/or diagnosis of SARS-CoV-2 by FDA under an Emergency Use Authorization (EUA). This EUA will remain in effect (meaning this test can be used) for the duration of the COVID-19 declaration under Section 564(b)(1) of the Act, 21 U.S.C. section 360bbb-3(b)(1), unless the authorization is terminated or revoked.     Resp Syncytial Virus by PCR NEGATIVE NEGATIVE Final    Comment: (NOTE) Fact Sheet for Patients: BloggerCourse.com  Fact Sheet for Healthcare Providers: [employer]  This test is not yet approved or cleared by the [place] FDA and has been authorized for detection and/or diagnosis of SARS-CoV-2 by FDA under an Emergency Use Authorization (EUA). This EUA will remain in effect (meaning this test can be used) for the duration of the COVID-19 declaration under Section 564(b)(1) of the Act, 21 U.S.C. section 360bbb-3(b)(1), unless the authorization is terminated or revoked.  Performed at [facility], [address], [place], [place] [address]     Radiology Studies: ECHOCARDIOGRAM COMPLETE  Result Date: 06/09/2023    ECHOCARDIOGRAM REPORT   Patient Name:   Jose Lawrence Date of Exam: 06/09/2023  Medical Rec #:  [id number]         Height:       72.0 in Accession #:    [id number]        Weight:       197.0 lb Date of Birth:  March 16, 1941         BSA:          2.117 m Patient Age:    82 years          BP:           105/52 mmHg Patient Gender: M                 HR:           46 bpm. [facility] Procedure: 2D Echo, Cardiac Doppler and Color Doppler Indications:    Bacteremia R78.81  History:        Patient has prior history of Echocardiogram examinations, most                 recent 07/10/2018. Arrythmias:Atrial Fibrillation; Risk                 Factors:Hypertension and Diabetes.  Sonographer:    Celesta Gentile RCS Referring Phys: [id number] TRUNG T VU IMPRESSIONS  1. Left ventricular ejection fraction, by estimation, is 60 to 65%.  Healthcare Providers:  [employer]  This test is no t yet approved or cleared by the [place] and  has been authorized for detection and/or diagnosis of SARS-CoV-2 by FDA under an Emergency Use Authorization (EUA). This EUA will remain  in effect (meaning this test can be used) for the duration of the COVID-19 declaration under Section 564(b)(1) of the Act, 21 U.S.C.section  360bbb-3(b)(1), unless the authorization is terminated  or revoked sooner.       Influenza A by PCR NEGATIVE NEGATIVE Final   Influenza B by PCR NEGATIVE NEGATIVE Final    Comment: (NOTE) The Xpert Xpress SARS-CoV-2/FLU/RSV plus assay is intended as an aid in the diagnosis of influenza from Nasopharyngeal swab specimens and should not be used as a sole basis for treatment. Nasal washings and aspirates are unacceptable for Xpert Xpress SARS-CoV-2/FLU/RSV testing.  Fact Sheet for Patients: BloggerCourse.com  Fact Sheet for Healthcare Providers: [employer]  This test is not yet approved or cleared by the [place] FDA and has been authorized for detection and/or diagnosis of SARS-CoV-2 by FDA under an Emergency Use Authorization (EUA). This EUA will remain in effect (meaning this test can be used) for the duration of the COVID-19 declaration under Section 564(b)(1) of the Act, 21 U.S.C. section 360bbb-3(b)(1), unless the authorization is terminated or revoked.     Resp Syncytial Virus by PCR NEGATIVE NEGATIVE Final    Comment: (NOTE) Fact Sheet for Patients: BloggerCourse.com  Fact Sheet for Healthcare Providers: [employer]  This test is not yet approved or cleared by the [place] FDA and has been authorized for detection and/or diagnosis of SARS-CoV-2 by FDA under an Emergency Use Authorization (EUA). This EUA will remain in effect (meaning this test can be used) for the duration of the COVID-19 declaration under Section 564(b)(1) of the Act, 21 U.S.C. section 360bbb-3(b)(1), unless the authorization is terminated or revoked.  Performed at [facility], [address], [place], [place] [address]     Radiology Studies: ECHOCARDIOGRAM COMPLETE  Result Date: 06/09/2023    ECHOCARDIOGRAM REPORT   Patient Name:   Jose Lawrence Date of Exam: 06/09/2023  Medical Rec #:  [id number]         Height:       72.0 in Accession #:    [id number]        Weight:       197.0 lb Date of Birth:  March 16, 1941         BSA:          2.117 m Patient Age:    82 years          BP:           105/52 mmHg Patient Gender: M                 HR:           46 bpm. [facility] Procedure: 2D Echo, Cardiac Doppler and Color Doppler Indications:    Bacteremia R78.81  History:        Patient has prior history of Echocardiogram examinations, most                 recent 07/10/2018. Arrythmias:Atrial Fibrillation; Risk                 Factors:Hypertension and Diabetes.  Sonographer:    Celesta Gentile RCS Referring Phys: [id number] TRUNG T VU IMPRESSIONS  1. Left ventricular ejection fraction, by estimation, is 60 to 65%.  Healthcare Providers:  [employer]  This test is no t yet approved or cleared by the [place] and  has been authorized for detection and/or diagnosis of SARS-CoV-2 by FDA under an Emergency Use Authorization (EUA). This EUA will remain  in effect (meaning this test can be used) for the duration of the COVID-19 declaration under Section 564(b)(1) of the Act, 21 U.S.C.section  360bbb-3(b)(1), unless the authorization is terminated  or revoked sooner.       Influenza A by PCR NEGATIVE NEGATIVE Final   Influenza B by PCR NEGATIVE NEGATIVE Final    Comment: (NOTE) The Xpert Xpress SARS-CoV-2/FLU/RSV plus assay is intended as an aid in the diagnosis of influenza from Nasopharyngeal swab specimens and should not be used as a sole basis for treatment. Nasal washings and aspirates are unacceptable for Xpert Xpress SARS-CoV-2/FLU/RSV testing.  Fact Sheet for Patients: BloggerCourse.com  Fact Sheet for Healthcare Providers: [employer]  This test is not yet approved or cleared by the [place] FDA and has been authorized for detection and/or diagnosis of SARS-CoV-2 by FDA under an Emergency Use Authorization (EUA). This EUA will remain in effect (meaning this test can be used) for the duration of the COVID-19 declaration under Section 564(b)(1) of the Act, 21 U.S.C. section 360bbb-3(b)(1), unless the authorization is terminated or revoked.     Resp Syncytial Virus by PCR NEGATIVE NEGATIVE Final    Comment: (NOTE) Fact Sheet for Patients: BloggerCourse.com  Fact Sheet for Healthcare Providers: [employer]  This test is not yet approved or cleared by the [place] FDA and has been authorized for detection and/or diagnosis of SARS-CoV-2 by FDA under an Emergency Use Authorization (EUA). This EUA will remain in effect (meaning this test can be used) for the duration of the COVID-19 declaration under Section 564(b)(1) of the Act, 21 U.S.C. section 360bbb-3(b)(1), unless the authorization is terminated or revoked.  Performed at [facility], [address], [place], [place] [address]     Radiology Studies: ECHOCARDIOGRAM COMPLETE  Result Date: 06/09/2023    ECHOCARDIOGRAM REPORT   Patient Name:   Jose Lawrence Date of Exam: 06/09/2023  Medical Rec #:  [id number]         Height:       72.0 in Accession #:    [id number]        Weight:       197.0 lb Date of Birth:  March 16, 1941         BSA:          2.117 m Patient Age:    82 years          BP:           105/52 mmHg Patient Gender: M                 HR:           46 bpm. [facility] Procedure: 2D Echo, Cardiac Doppler and Color Doppler Indications:    Bacteremia R78.81  History:        Patient has prior history of Echocardiogram examinations, most                 recent 07/10/2018. Arrythmias:Atrial Fibrillation; Risk                 Factors:Hypertension and Diabetes.  Sonographer:    Celesta Gentile RCS Referring Phys: [id number] TRUNG T VU IMPRESSIONS  1. Left ventricular ejection fraction, by estimation, is 60 to 65%.  Healthcare Providers:  [employer]  This test is no t yet approved or cleared by the [place] and  has been authorized for detection and/or diagnosis of SARS-CoV-2 by FDA under an Emergency Use Authorization (EUA). This EUA will remain  in effect (meaning this test can be used) for the duration of the COVID-19 declaration under Section 564(b)(1) of the Act, 21 U.S.C.section  360bbb-3(b)(1), unless the authorization is terminated  or revoked sooner.       Influenza A by PCR NEGATIVE NEGATIVE Final   Influenza B by PCR NEGATIVE NEGATIVE Final    Comment: (NOTE) The Xpert Xpress SARS-CoV-2/FLU/RSV plus assay is intended as an aid in the diagnosis of influenza from Nasopharyngeal swab specimens and should not be used as a sole basis for treatment. Nasal washings and aspirates are unacceptable for Xpert Xpress SARS-CoV-2/FLU/RSV testing.  Fact Sheet for Patients: BloggerCourse.com  Fact Sheet for Healthcare Providers: [employer]  This test is not yet approved or cleared by the [place] FDA and has been authorized for detection and/or diagnosis of SARS-CoV-2 by FDA under an Emergency Use Authorization (EUA). This EUA will remain in effect (meaning this test can be used) for the duration of the COVID-19 declaration under Section 564(b)(1) of the Act, 21 U.S.C. section 360bbb-3(b)(1), unless the authorization is terminated or revoked.     Resp Syncytial Virus by PCR NEGATIVE NEGATIVE Final    Comment: (NOTE) Fact Sheet for Patients: BloggerCourse.com  Fact Sheet for Healthcare Providers: [employer]  This test is not yet approved or cleared by the [place] FDA and has been authorized for detection and/or diagnosis of SARS-CoV-2 by FDA under an Emergency Use Authorization (EUA). This EUA will remain in effect (meaning this test can be used) for the duration of the COVID-19 declaration under Section 564(b)(1) of the Act, 21 U.S.C. section 360bbb-3(b)(1), unless the authorization is terminated or revoked.  Performed at [facility], [address], [place], [place] [address]     Radiology Studies: ECHOCARDIOGRAM COMPLETE  Result Date: 06/09/2023    ECHOCARDIOGRAM REPORT   Patient Name:   Jose Lawrence Date of Exam: 06/09/2023  Medical Rec #:  [id number]         Height:       72.0 in Accession #:    [id number]        Weight:       197.0 lb Date of Birth:  March 16, 1941         BSA:          2.117 m Patient Age:    82 years          BP:           105/52 mmHg Patient Gender: M                 HR:           46 bpm. [facility] Procedure: 2D Echo, Cardiac Doppler and Color Doppler Indications:    Bacteremia R78.81  History:        Patient has prior history of Echocardiogram examinations, most                 recent 07/10/2018. Arrythmias:Atrial Fibrillation; Risk                 Factors:Hypertension and Diabetes.  Sonographer:    Celesta Gentile RCS Referring Phys: [id number] TRUNG T VU IMPRESSIONS  1. Left ventricular ejection fraction, by estimation, is 60 to 65%.  PROGRESS NOTE  Jose Lawrence, is a 82 y.o. male, DOB - 01-Jan-1941, ZOX:[id number]  Admit date - 06/08/2023   Admitting Physician Genecis Veley Mariea Clonts, MD  Outpatient Primary MD for the patient is Ignatius Specking, MD  LOS - 1  Chief Complaint  Patient presents with   Fever        Brief Narrative:  82 y.o. 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 Renold Don--- who recommends if negative w/u,  treat for 10 days with abx and transition to oral abx after 2 days clinical stability/blood culture clearance -If recurrent would repeat evaluation as above and consider pet scan/tagged wbc scan if nothing obvious -Patient without obvious skin or joint infections  2) Klebsiella UTI with Hematuria--suspect this is due to UTI -IV Rocephin pending culture data -Persistent Hematuria--patient had cystoscopy on 05/30/2023 with no significant urothelial lesion -Patient was seen by urologist Dr. Retta Diones for follow-up on 06/07/2023--- due to persistent hematuria -Hopefully will Not need for CBI -Continue Flomax   3)Acute Urinary retention with hydroureteronephrosis  Secondary to BPH -Managed by urology -Please see #2 above   4) chronic anemia and thrombocytopenia--- suspect this is due to underlying liver cirrhosis -Both hemoglobin and platelets appear to be close to recent  baseline -Monitor H&H closely in the setting of persistent hematuria   5)Non-alcoholic cirrhosis of liver ----?? Chemo/medication induced -LFTs noted Clinically with no significant ascites.  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 Last follow up with no recurrence.    7)Atrial fibrillation, chronic -Continue Coreg for rate control,  not on anticoagulation-due to bleeding concerns in the setting of persistent hematuria and persistent thrombocytopenia and anemia due to cirrhosis   8)Controlled type 2 diabetes mellitus without complication  Diet controlled at this time   9)Hypothyroidism Continue with levothyroxine.    10)DM2--history of uncontrolled DM -Hold glipizide Use Novolog/Humalog Sliding scale insulin with Accu-Cheks/Fingersticks as ordered   Status is: Inpatient   Disposition: The patient is from: Home              Anticipated d/c is to: Home              Anticipated d/c date is: 2 days              Patient currently is not medically stable to d/c. Barriers: Not Clinically Stable-   Code Status :  -  Code Status: Full Code   Family Communication:   -Discussed with daughter Juliette Alcide at bedside  DVT Prophylaxis  :   - SCDs   SCDs Start: 06/08/23 1642 Place TED hose Start: 06/08/23 1642   Lab Results  Component Value Date   PLT 62 (L) 06/08/2023    Inpatient Medications  Scheduled Meds:  carvedilol  3.125 mg Oral BID WC   influenza vaccine adjuvanted  0.5 mL Intramuscular Tomorrow-1000   insulin aspart  0-5 Units Subcutaneous  PROGRESS NOTE  Jose Lawrence, is a 82 y.o. male, DOB - 01-Jan-1941, ZOX:[id number]  Admit date - 06/08/2023   Admitting Physician Genecis Veley Mariea Clonts, MD  Outpatient Primary MD for the patient is Ignatius Specking, MD  LOS - 1  Chief Complaint  Patient presents with   Fever        Brief Narrative:  82 y.o. 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 Renold Don--- who recommends if negative w/u,  treat for 10 days with abx and transition to oral abx after 2 days clinical stability/blood culture clearance -If recurrent would repeat evaluation as above and consider pet scan/tagged wbc scan if nothing obvious -Patient without obvious skin or joint infections  2) Klebsiella UTI with Hematuria--suspect this is due to UTI -IV Rocephin pending culture data -Persistent Hematuria--patient had cystoscopy on 05/30/2023 with no significant urothelial lesion -Patient was seen by urologist Dr. Retta Diones for follow-up on 06/07/2023--- due to persistent hematuria -Hopefully will Not need for CBI -Continue Flomax   3)Acute Urinary retention with hydroureteronephrosis  Secondary to BPH -Managed by urology -Please see #2 above   4) chronic anemia and thrombocytopenia--- suspect this is due to underlying liver cirrhosis -Both hemoglobin and platelets appear to be close to recent  baseline -Monitor H&H closely in the setting of persistent hematuria   5)Non-alcoholic cirrhosis of liver ----?? Chemo/medication induced -LFTs noted Clinically with no significant ascites.  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 Last follow up with no recurrence.    7)Atrial fibrillation, chronic -Continue Coreg for rate control,  not on anticoagulation-due to bleeding concerns in the setting of persistent hematuria and persistent thrombocytopenia and anemia due to cirrhosis   8)Controlled type 2 diabetes mellitus without complication  Diet controlled at this time   9)Hypothyroidism Continue with levothyroxine.    10)DM2--history of uncontrolled DM -Hold glipizide Use Novolog/Humalog Sliding scale insulin with Accu-Cheks/Fingersticks as ordered   Status is: Inpatient   Disposition: The patient is from: Home              Anticipated d/c is to: Home              Anticipated d/c date is: 2 days              Patient currently is not medically stable to d/c. Barriers: Not Clinically Stable-   Code Status :  -  Code Status: Full Code   Family Communication:   -Discussed with daughter Juliette Alcide at bedside  DVT Prophylaxis  :   - SCDs   SCDs Start: 06/08/23 1642 Place TED hose Start: 06/08/23 1642   Lab Results  Component Value Date   PLT 62 (L) 06/08/2023    Inpatient Medications  Scheduled Meds:  carvedilol  3.125 mg Oral BID WC   influenza vaccine adjuvanted  0.5 mL Intramuscular Tomorrow-1000   insulin aspart  0-5 Units Subcutaneous  Healthcare Providers:  [employer]  This test is no t yet approved or cleared by the [place] and  has been authorized for detection and/or diagnosis of SARS-CoV-2 by FDA under an Emergency Use Authorization (EUA). This EUA will remain  in effect (meaning this test can be used) for the duration of the COVID-19 declaration under Section 564(b)(1) of the Act, 21 U.S.C.section  360bbb-3(b)(1), unless the authorization is terminated  or revoked sooner.       Influenza A by PCR NEGATIVE NEGATIVE Final   Influenza B by PCR NEGATIVE NEGATIVE Final    Comment: (NOTE) The Xpert Xpress SARS-CoV-2/FLU/RSV plus assay is intended as an aid in the diagnosis of influenza from Nasopharyngeal swab specimens and should not be used as a sole basis for treatment. Nasal washings and aspirates are unacceptable for Xpert Xpress SARS-CoV-2/FLU/RSV testing.  Fact Sheet for Patients: BloggerCourse.com  Fact Sheet for Healthcare Providers: [employer]  This test is not yet approved or cleared by the [place] FDA and has been authorized for detection and/or diagnosis of SARS-CoV-2 by FDA under an Emergency Use Authorization (EUA). This EUA will remain in effect (meaning this test can be used) for the duration of the COVID-19 declaration under Section 564(b)(1) of the Act, 21 U.S.C. section 360bbb-3(b)(1), unless the authorization is terminated or revoked.     Resp Syncytial Virus by PCR NEGATIVE NEGATIVE Final    Comment: (NOTE) Fact Sheet for Patients: BloggerCourse.com  Fact Sheet for Healthcare Providers: [employer]  This test is not yet approved or cleared by the [place] FDA and has been authorized for detection and/or diagnosis of SARS-CoV-2 by FDA under an Emergency Use Authorization (EUA). This EUA will remain in effect (meaning this test can be used) for the duration of the COVID-19 declaration under Section 564(b)(1) of the Act, 21 U.S.C. section 360bbb-3(b)(1), unless the authorization is terminated or revoked.  Performed at [facility], [address], [place], [place] [address]     Radiology Studies: ECHOCARDIOGRAM COMPLETE  Result Date: 06/09/2023    ECHOCARDIOGRAM REPORT   Patient Name:   Jose Lawrence Date of Exam: 06/09/2023  Medical Rec #:  [id number]         Height:       72.0 in Accession #:    [id number]        Weight:       197.0 lb Date of Birth:  March 16, 1941         BSA:          2.117 m Patient Age:    82 years          BP:           105/52 mmHg Patient Gender: M                 HR:           46 bpm. [facility] Procedure: 2D Echo, Cardiac Doppler and Color Doppler Indications:    Bacteremia R78.81  History:        Patient has prior history of Echocardiogram examinations, most                 recent 07/10/2018. Arrythmias:Atrial Fibrillation; Risk                 Factors:Hypertension and Diabetes.  Sonographer:    Celesta Gentile RCS Referring Phys: [id number] TRUNG T VU IMPRESSIONS  1. Left ventricular ejection fraction, by estimation, is 60 to 65%.

## 2023-06-09 NOTE — TOC Initial Note (Signed)
Transition of Care [facility]) - Initial/Assessment Note    Patient Details  Name: Jose Lawrence MRN: [id number] Date of Birth: [birth year]  [facility]) CM/SW Contact:    Leitha Bleak, RN Phone Number: 06/09/2023, 2:44 PM  Clinical Narrative:  Patient admitted with Fever. Patient has a high risk of readmission. CM spoke with his wife. They live in [place]. He drives occasionally, she does most of the driving. He is mostly independent with his ADLS's, does not use a cane or walker. CM offered home health. She does not feel they need it at this time. TOC following patient still getting medical workup.                 Expected Discharge Plan: Home/Self Care Barriers to Discharge: Continued Medical Work up   Patient Goals and CMS Choice Patient states their goals for this hospitalization and ongoing recovery are:: to go home. CMS Medicare.gov Compare Post Acute Care list provided to:: Patient Represenative (must comment) Choice offered to / list presented to : Spouse     Expected Discharge Plan and Services       Living arrangements for the past 2 months: Single Family Home                      Prior Living Arrangements/Services Living arrangements for the past 2 months: Single Family Home Lives with:: Spouse Patient language and need for interpreter reviewed:: Yes        Need for Family Participation in Patient Care: Yes (Comment) Care giver support system in place?: Yes (comment)   Criminal Activity/Legal Involvement Pertinent to Current Situation/Hospitalization: No - Comment as needed  Activities of Daily Living   ADL Screening (condition at time of admission) Independently performs ADLs?: Yes (appropriate for developmental age) Is the patient deaf or have difficulty hearing?: No Does the patient have difficulty seeing, even when wearing glasses/contacts?: No Does the patient have difficulty concentrating, remembering, or making decisions?:  No  Permission Sought/Granted         Permission granted to share info w Relationship: wife     Emotional Assessment     Affect (typically observed): Accepting Orientation: : Oriented to Self, Oriented to Place, Oriented to  Time, Oriented to Situation Alcohol / Substance Use: Not Applicable Psych Involvement: No (comment)  Admission diagnosis:  Hyponatremia [E87.1] Serum total bilirubin elevated [R17] Alkaline phosphatase elevation [R74.8] Fever [R50.9] Macrocytic anemia [D53.9] Elevated AST (SGOT) [R74.01] Elevated random blood glucose level [R73.9] Fever in adult [R50.9] Patient Active Problem List   Diagnosis Date Noted   Fever 06/08/2023   UTI (urinary tract infection) 06/08/2023   Hematuria 06/08/2023   Secondary esophageal varices without bleeding (HCC) 05/10/2023   Streptococcal bacteremia 04/23/2023   Urinary retention 04/22/2023   Cirrhosis of liver (HCC) 04/22/2023   Hypomagnesemia 04/22/2023   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   Cirrhosis of liver without ascites (HCC) 04/13/2023   Elevated LFTs 09/16/2022   Acute GI bleeding 10/03/2021   Elevated AST (SGOT)    Hypotension due to hypovolemia    Acute renal failure 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) 12/24/2020   Thrombocytopenia (HCC) 12/24/2020   Lactic acidosis 12/24/2020   Permanent atrial fibrillation (HCC) 12/24/2020   Uncontrolled type 2 diabetes mellitus with hyperglycemia, without long-term current use of insulin (HCC) 12/24/2020   Elevated lactic acid level    Hyperkalemia    Hyponatremia    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  AKI (acute kidney injury) (HCC) 12/23/2020   Lumbar spondylosis 08/26/2020   Body mass index (BMI) 27.0-27.9, adult 07/02/2020   Elevated blood-pressure reading, without diagnosis of hypertension 07/02/2020   Herniated nucleus pulposus, lumbar 06/30/2020   Spondylitis (HCC) 06/30/2020   Lumbar radiculopathy 06/23/2020    Spondylolisthesis at L5-S1 level 06/23/2020   Vitamin B 12 deficiency 07/10/2019   Anemia 09/14/2018   Malaise and fatigue 09/14/2018   Indwelling Foley catheter present 09/14/2018   Intraoperative bladder injury 09/04/2018   Postoperative anemia due to acute blood loss 09/04/2018   Cancer of transverse colon (HCC)    Iron deficiency anemia due to chronic blood loss 08/25/2018   Hypothyroidism 08/25/2018   HTN (hypertension) 08/25/2018   Controlled type 2 diabetes mellitus without complication (HCC) 08/25/2018   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 [place] [address] Phone: [phone] Fax: [phone]   Social Determinants of Health (SDOH) Social History: SDOH Screenings   Food Insecurity: No Food Insecurity (06/08/2023)  Housing: Low Risk  (06/08/2023)  Transportation Needs: No Transportation Needs (06/08/2023)  Utilities: Not At Risk (06/08/2023)  Alcohol Screen: Low Risk  (09/01/2020)  Depression (PHQ2-9): Low Risk  (09/01/2020)  Financial Resource Strain: Low Risk  (09/01/2020)  Physical Activity: Sufficiently Active (09/01/2020)  Social Connections: Socially Integrated (09/01/2020)  Stress: No Stress Concern Present (09/01/2020)  Tobacco Use: Medium Risk (06/08/2023)   SDOH Interventions:     Readmission Risk Interventions    06/09/2023    2:44 PM  Readmission Risk Prevention Plan  Transportation Screening Complete  PCP or Specialist Appt within 3-5 Days Not Complete  HRI or Home Care Consult Complete  Social Work Consult for Recovery Care Planning/Counseling Complete  Palliative Care Screening Not Applicable   Medication Review [employer]) Complete

## 2023-06-09 NOTE — Consult Note (Signed)
[facility]    Date of Admission:  06/08/2023     Reason for Consult: recurrent group b strep bsi    Referring Provider: Mariea Clonts, Courage      Abx: ceftriaxone        Assessment: Group b strep bsi Hematuria Cirrhosis Dm2  While most group b strep bsi can be of unclear source, he has had it in urine and ongoing hematuria so could be low burden bacteriuria and translocated into the blood. Would look for any sign of diabetic foot ulcer as well.     09/2021 capsule endoscopy showed small bowel petechiae. Also has esophageal varices and portal hypertensive gastropathy. ?ongoing smoldering gib vs cirrhotic related GI compromise, with translocation    10/30 bcx 1 set obtained showing gbs 10/30 ucx in progress    Plan: Would change antibiotics to penicillin for now Repeat bcx Echo Workup for current metastatic disease (bone/joint infection) from this episode or sign of lingering focus of bone joint infection from previous bsi episode If negative w/u, would treat for 10 days with abx and transition to oral abx after 2 days clinical stability/blood culture clearance If recurrent would repeat evaluation as above and consider pet scan/tagged wbc scan if nothing obvious Discussed with primary team    I spent more than 15 minute reviewing data/chart, and discussing diagnostics/treatment plan with treatment team      ------------------------------------------------ Principal Problem:   Fever Active Problems:   Iron deficiency anemia due to chronic blood loss   Hypothyroidism   BPH (benign prostatic hyperplasia)   Thrombocytopenia (HCC)   Permanent atrial fibrillation (HCC)   Uncontrolled type 2 diabetes mellitus with hyperglycemia, without long-term current use of insulin (HCC)   Elevated LFTs   Cirrhosis of liver (HCC)   UTI (urinary tract infection)   Hematuria    HPI: Jose Lawrence is a 82 y.o. male cirrhosis, hx PE,  dm2, hematuria, bladder outlet obstruction, recent group b strep bacteremia admitted for another bout   Reviewed chart 04/22/2023 bcx group b strep 2 of 2 sets; no repeat bcx and no echo. Chart review showed mention of urinary frequency and urinary retention chronically; imaging showed bilateral hydronephrosis. Urinary cx at that time ecoli and bcx group b strep. Foley placed and discharged home with it. Received 10 days abx ceftriaxone --> amoxicillin Recent upper gi endoscopy - grade 1 esophageal varices, portal hypertensive gastropathy Recent cystoscopy 10/21 for hematuria -- no obvious cause; ongoing hematuria Per staff at [facility], no focal joint/back pain  From chart no indwelling cath/cardiac device  H/x of colon cancer s/p partial colectomy and ostomy  09/2021 had capsule endoscopy showing scattered petechiae throughout small bowel  10/2021 ucx group b strep  Stable mildly elevated lft; baseline creatinine; no leukocytosis; chronic thrombocytopenia stable No fever  Family History  Problem Relation Age of Onset   Heart disease Father    Heart failure Father    Heart attack Father    Prostate cancer Paternal Uncle     Social History   Tobacco Use   Smoking status: Former    Current packs/day: 0.00    Average packs/day: 1 pack/day for 27.0 years (27.0 ttl pk-yrs)    Types: Cigarettes    Start date: 01/27/1964    Quit date: 01/27/1991    Years since quitting: 32.3   Smokeless tobacco: Former    Types: Chew    Quit date: 08/10/1995  Vaping Use  Vaping status: Never Used  Substance Use Topics   Alcohol use: Never    Alcohol/week: 0.0 standard drinks of alcohol   Drug use: Never    No Known Allergies  Review of Systems: ROS All Other ROS was negative, except mentioned above   Past Medical History:  Diagnosis Date   Anemia    Causing interruption in anticoagulation   Atrial fibrillation and flutter (HCC)    BPH (benign prostatic hypertrophy)    Colon cancer  (HCC)    Status post LAR 2008   Dysrhythmia    Hypothyroidism    Lacunar infarction (HCC) 11/2012   RT 3rd NERVE PALSY, SB Dr. Lita Mains   PE (pulmonary embolism)    Temporarily on Eliquis 2017   Skin cancer    35 years ago   Type 2 diabetes mellitus (HCC)    Vitamin B 12 deficiency 07/10/2019       Scheduled Meds:  carvedilol  3.125 mg Oral BID WC   heparin  5,000 Units Subcutaneous Q8H   influenza vaccine adjuvanted  0.5 mL Intramuscular Tomorrow-1000   insulin aspart  0-5 Units Subcutaneous QHS   insulin aspart  0-6 Units Subcutaneous TID WC   levothyroxine  50 mcg Oral QAC breakfast   loperamide  4 mg Oral TID AC & HS   pneumococcal 20-valent conjugate vaccine  0.5 mL Intramuscular Tomorrow-1000   sodium chloride flush  3 mL Intravenous Q12H   sodium chloride flush  3 mL Intravenous Q12H   tamsulosin  0.4 mg Oral QPC supper   Continuous Infusions:  sodium chloride     cefTRIAXone (ROCEPHIN)  IV 2 g (06/09/23 0856)   PRN Meds:.sodium chloride, acetaminophen **OR** acetaminophen, bisacodyl, ondansetron **OR** ondansetron (ZOFRAN) IV, polyethylene glycol, sodium chloride flush, traZODone   OBJECTIVE: Blood pressure (!) 105/52, pulse (!) 46, temperature 99.7 F (37.6 C), resp. rate 16, height 6' (1.829 m), weight 89.4 kg, SpO2 98%.  Physical Exam   Reviewed    Lab Results Lab Results  Component Value Date   WBC 10.4 06/08/2023   HGB 10.9 (L) 06/08/2023   HCT 32.5 (L) 06/08/2023   MCV 108.7 (H) 06/08/2023   PLT 62 (L) 06/08/2023    Lab Results  Component Value Date   CREATININE 1.09 06/08/2023   BUN 14 06/08/2023   NA 132 (L) 06/08/2023   K 4.4 06/08/2023   CL 103 06/08/2023   CO2 23 06/08/2023    Lab Results  Component Value Date   ALT 40 06/08/2023   AST 65 (H) 06/08/2023   ALKPHOS 128 (H) 06/08/2023   BILITOT 3.9 (H) 06/08/2023      Microbiology: Recent Results (from the past 240 hour(s))  Microscopic Examination     Status: Abnormal    Collection Time: 05/30/23  2:45 PM   Urine  Result Value Ref Range Status   WBC, UA >30 (A) 0 - 5 /hpf Final   RBC, Urine 11-30 (A) 0 - 2 /hpf Final   Epithelial Cells (non renal) 0-10 0 - 10 /hpf Final   Bacteria, UA Many (A) None seen/Few Final  Microscopic Examination     Status: Abnormal   Collection Time: 06/07/23 11:16 AM   Urine  Result Value Ref Range Status   WBC, UA 6-10 (A) 0 - 5 /hpf Final   RBC, Urine >30 (A) 0 - 2 /hpf Final   Epithelial Cells (non renal) 0-10 0 - 10 /hpf Final   Crystals Present (A) N/A Final  Crystal Type Amorphous Sediment N/A Final   Bacteria, UA Few (A) None seen/Few Final  Blood Culture (routine x 2)     Status: None (Preliminary result)   Collection Time: 06/08/23  6:36 AM   Specimen: BLOOD  Result Value Ref Range Status   Specimen Description   Final    BLOOD BLOOD RIGHT ARM Performed at [facility], [address] [place], [place] [address]    Special Requests   Final    BOTTLES DRAWN AEROBIC AND ANAEROBIC Blood Culture results may not be optimal due to an excessive volume of blood received in culture bottles Performed at [facility], [address] [place], [place] [address]    Culture  Setup Time   Final    GRAM POSITIVE COCCI Gram Stain Report Called to,Read Back By and Verified With: WAGONER,R ON 06/08/23 AT 2250 BY LOY,C AEROBIC BOTTLE CRITICAL RESULT CALLED TO, READ BACK BY AND VERIFIED WITH: Ardis Rowan RN 06/08/23 @ 2355 BY AB Performed at [facility], [address] [place], [place] [address]    Culture GRAM POSITIVE COCCI  Final   Report Status PENDING  Incomplete  Blood Culture (routine x 2)     Status: None (Preliminary result)   Collection Time: 06/08/23  6:36 AM   Specimen: BLOOD  Result Value Ref Range Status   Specimen Description   Final    BLOOD BLOOD LEFT ARM Performed at [facility], [address], [place], [place] [address]    Special Requests   Final    BOTTLES DRAWN AEROBIC AND ANAEROBIC Blood  Culture results may not be optimal due to an excessive volume of blood received in culture bottles Performed at [facility], [address] [place], [place] [address]    Culture   Final    NO GROWTH 1 DAY Performed at [facility], [address], [place], [place] [address]    Report Status PENDING  Incomplete  Blood Culture ID Panel (Reflexed)     Status: Abnormal   Collection Time: 06/08/23  6:36 AM  Result Value Ref Range Status   Enterococcus faecalis NOT DETECTED NOT DETECTED Final   Enterococcus Faecium NOT DETECTED NOT DETECTED Final   Listeria monocytogenes NOT DETECTED NOT DETECTED Final   Staphylococcus species NOT DETECTED NOT DETECTED Final   Staphylococcus aureus (BCID) NOT DETECTED NOT DETECTED Final   Staphylococcus epidermidis NOT DETECTED NOT DETECTED Final   Staphylococcus lugdunensis NOT DETECTED NOT DETECTED Final   Streptococcus species DETECTED (A) NOT DETECTED Final    Comment: CRITICAL RESULT CALLED TO, READ BACK BY AND VERIFIED WITH: RArdelle Anton RN 06/08/23 @ 2355 BY AB    Streptococcus agalactiae DETECTED (A) NOT DETECTED Final    Comment: CRITICAL RESULT CALLED TO, READ BACK BY AND VERIFIED WITH: RArdelle Anton RN 06/08/23 @ 2355 BY AB    Streptococcus pneumoniae NOT DETECTED NOT DETECTED Final   Streptococcus pyogenes NOT DETECTED NOT DETECTED Final   A.calcoaceticus-baumannii NOT DETECTED NOT DETECTED Final   Bacteroides fragilis NOT DETECTED NOT DETECTED Final   Enterobacterales NOT DETECTED NOT DETECTED Final   Enterobacter cloacae complex NOT DETECTED NOT DETECTED Final   Escherichia coli NOT DETECTED NOT DETECTED Final   Klebsiella aerogenes NOT DETECTED NOT DETECTED Final   Klebsiella oxytoca NOT DETECTED NOT DETECTED Final   Klebsiella pneumoniae NOT DETECTED NOT DETECTED Final   Proteus species NOT DETECTED NOT DETECTED Final   Salmonella species NOT DETECTED NOT DETECTED Final  Serratia marcescens NOT DETECTED NOT DETECTED Final    Haemophilus influenzae NOT DETECTED NOT DETECTED Final   Neisseria meningitidis NOT DETECTED NOT DETECTED Final   Pseudomonas aeruginosa NOT DETECTED NOT DETECTED Final   Stenotrophomonas maltophilia NOT DETECTED NOT DETECTED Final   Candida albicans NOT DETECTED NOT DETECTED Final   Candida auris NOT DETECTED NOT DETECTED Final   Candida glabrata NOT DETECTED NOT DETECTED Final   Candida krusei NOT DETECTED NOT DETECTED Final   Candida parapsilosis NOT DETECTED NOT DETECTED Final   Candida tropicalis NOT DETECTED NOT DETECTED Final   Cryptococcus neoformans/gattii NOT DETECTED NOT DETECTED Final    Comment: Performed at [facility], [address], [place], [place] [address]  Resp panel by RT-PCR (RSV, Flu A&B, Covid) Anterior Nasal Swab     Status: None   Collection Time: 06/08/23  9:04 AM   Specimen: Anterior Nasal Swab  Result Value Ref Range Status   SARS Coronavirus 2 by RT PCR NEGATIVE NEGATIVE Final    Comment: (NOTE) SARS-CoV-2 target nucleic acids are NOT DETECTED.  The SARS-CoV-2 RNA is generally detectable in upper respiratory specimens during the acute phase of infection. The lowest concentration of SARS-CoV-2 viral copies this assay can detect is 138 copies/mL. A negative result does not preclude SARS-Cov-2 infection and should not be used as the sole basis for treatment or other patient management decisions. A negative result may occur with  improper specimen collection/handling, submission of specimen other than nasopharyngeal swab, presence of viral mutation(s) within the areas targeted by this assay, and inadequate number of viral copies(<138 copies/mL). A negative result must be combined with clinical observations, patient history, and epidemiological information. The expected result is Negative.  Fact Sheet for Patients:  BloggerCourse.com  Fact Sheet for Healthcare Providers:  SeriousBroker.it  This  test is no t yet approved or cleared by the [place] FDA and  has been authorized for detection and/or diagnosis of SARS-CoV-2 by FDA under an Emergency Use Authorization (EUA). This EUA will remain  in effect (meaning this test can be used) for the duration of the COVID-19 declaration under Section 564(b)(1) of the Act, 21 U.S.C.section 360bbb-3(b)(1), unless the authorization is terminated  or revoked sooner.       Influenza A by PCR NEGATIVE NEGATIVE Final   Influenza B by PCR NEGATIVE NEGATIVE Final    Comment: (NOTE) The Xpert Xpress SARS-CoV-2/FLU/RSV plus assay is intended as an aid in the diagnosis of influenza from Nasopharyngeal swab specimens and should not be used as a sole basis for treatment. Nasal washings and aspirates are unacceptable for Xpert Xpress SARS-CoV-2/FLU/RSV testing.  Fact Sheet for Patients: BloggerCourse.com  Fact Sheet for Healthcare Providers: SeriousBroker.it  This test is not yet approved or cleared by the [place] FDA and has been authorized for detection and/or diagnosis of SARS-CoV-2 by FDA under an Emergency Use Authorization (EUA). This EUA will remain in effect (meaning this test can be used) for the duration of the COVID-19 declaration under Section 564(b)(1) of the Act, 21 U.S.C. section 360bbb-3(b)(1), unless the authorization is terminated or revoked.     Resp Syncytial Virus by PCR NEGATIVE NEGATIVE Final    Comment: (NOTE) Fact Sheet for Patients: BloggerCourse.com  Fact Sheet for Healthcare Providers: SeriousBroker.it  This test is not yet approved or cleared by the [place] FDA and has been authorized for detection and/or diagnosis of SARS-CoV-2 by FDA under an Emergency Use Authorization (EUA). This EUA will remain in effect (meaning  this test can be used) for the duration of the COVID-19 declaration under  Section 564(b)(1) of the Act, 21 U.S.C. section 360bbb-3(b)(1), unless the authorization is terminated or revoked.  Performed at [facility], [address] [place], [place] [address]      Serology:    Imaging: If present, new imagings (plain films, ct scans, and mri) have been personally visualized and interpreted; radiology reports have been reviewed. Decision making incorporated into the Impression / Recommendations.  10/30 cxr 1. No evidence of pneumonia. 2. Mild cardiomegaly with interval improvement in central vascular prominence. 3. Minimal bilateral pleural effusions, similar to previous study. 4. Small hiatal hernia.   9/13 ct abd pelv 1. New bilateral hydroureteronephrosis to the level of the urinary bladder, which demonstrates interval increased distention with mild circumferential mural thickening. Recommend correlation with urinalysis. 2. Unchanged focal lesion along the posterior left bladder measuring 1.1 x 0.7 cm. Cystoscopic evaluation is again recommended. 3. Cirrhotic morphology with small volume ascites and portal enteropathy. 4. Similar moderate bilateral pleural effusions.   Raymondo Band, MD [facility] [phone] pager    06/09/2023, 9:02 AM

## 2023-06-09 NOTE — Progress Notes (Signed)
*  PRELIMINARY RESULTS* Echocardiogram 2D Echocardiogram has been performed.  Stacey Drain 06/09/2023, 1:56 PM

## 2023-06-10 DIAGNOSIS — B951 Streptococcus, group B, as the cause of diseases classified elsewhere: Secondary | ICD-10-CM | POA: Diagnosis not present

## 2023-06-10 DIAGNOSIS — R7881 Bacteremia: Secondary | ICD-10-CM | POA: Diagnosis not present

## 2023-06-10 LAB — URINE CULTURE: Culture: 20000 — AB

## 2023-06-10 LAB — CULTURE, BLOOD (ROUTINE X 2)

## 2023-06-10 LAB — GLUCOSE, CAPILLARY: Glucose-Capillary: 116 mg/dL — ABNORMAL HIGH (ref 70–99)

## 2023-06-10 MED ORDER — POTASSIUM CHLORIDE ER 10 MEQ PO TBCR: 10.0000 meq | EXTENDED_RELEASE_TABLET | Freq: Every day | ORAL | 0 refills | Status: DC | PRN

## 2023-06-10 MED ORDER — AMOXICILLIN 500 MG PO CAPS: 1000.0000 mg | ORAL_CAPSULE | Freq: Three times a day (TID) | ORAL | 0 refills | Status: AC

## 2023-06-10 MED ORDER — TAMSULOSIN HCL 0.4 MG PO CAPS: 0.4000 mg | ORAL_CAPSULE | Freq: Every day | ORAL | 3 refills | Status: DC

## 2023-06-10 MED ORDER — AMOXICILLIN 250 MG PO CAPS
1000.0000 mg | ORAL_CAPSULE | Freq: Three times a day (TID) | ORAL | Status: DC
  Administered 2023-06-10: 1000 mg via ORAL
  Filled 2023-06-10: qty 4

## 2023-06-10 NOTE — Progress Notes (Signed)
Id brief note   Clinically improved Kleb pnae in urine is esbl, but doesnt appear to be pathogenic/or having uti process  No current nidus to gbs infection    A/p Focus and finish 10 day tx for group b strep bsi with high dose amox Id will sign ofdiscuss with primary team

## 2023-06-10 NOTE — Discharge Instructions (Signed)
1)Avoid ibuprofen/Advil/Aleve/Motrin/Goody Powders/Naproxen/BC powders/Meloxicam/Diclofenac/Indomethacin and other Nonsteroidal anti-inflammatory medications as these will make you more likely to bleed and can cause stomach ulcers, can also cause Kidney problems.   2)Take Amoxicillin antibiotic as prescribed for the next 8 days  3) follow-up with primary care physician in 1 week for recheck and reevaluation----repeat CBC and BMP Blood Tests with PCP within a week

## 2023-06-10 NOTE — Discharge Summary (Signed)
Jose Lawrence, is a 82 y.o. male  DOB October 04, 1940  MRN [id number].  Admission date:  06/08/2023  Admitting Physician  Shon Hale, MD  Discharge Date:  06/10/2023   Primary MD  Ignatius Specking, MD  Recommendations for primary care physician for things to follow:   1)Avoid ibuprofen/Advil/Aleve/Motrin/Goody Powders/Naproxen/BC powders/Meloxicam/Diclofenac/Indomethacin and other Nonsteroidal anti-inflammatory medications as these will make you more likely to bleed and can cause stomach ulcers, can also cause Kidney problems.   2)Take Amoxicillin antibiotic as prescribed for the next 8 days  3) follow-up with primary care physician in 1 week for recheck and reevaluation----repeat CBC and BMP Blood Tests with PCP within a week  Admission Diagnosis  Hyponatremia [E87.1] Serum total bilirubin elevated [R17] Alkaline phosphatase elevation [R74.8] Fever [R50.9] Macrocytic anemia [D53.9] Elevated AST (SGOT) [R74.01] Elevated random blood glucose level [R73.9] Fever in adult [R50.9]   Discharge Diagnosis  Hyponatremia [E87.1] Serum total bilirubin elevated [R17] Alkaline phosphatase elevation [R74.8] Fever [R50.9] Macrocytic anemia [D53.9] Elevated AST (SGOT) [R74.01] Elevated random blood glucose level [R73.9] Fever in adult [R50.9]    Principal Problem:   Bacteremia due to group B Streptococcus/Agalactiae Active Problems:   UTI (urinary tract infection)   UTI due to Klebsiella species   Hematuria   Cirrhosis of liver (HCC)   Hypothyroidism   Iron deficiency anemia due to chronic blood loss   BPH (benign prostatic hyperplasia)   Thrombocytopenia (HCC)   Permanent atrial fibrillation (HCC)   Uncontrolled type 2 diabetes mellitus with hyperglycemia, without long-term current use of insulin (HCC)   Elevated LFTs   Fever      Past Medical History:  Diagnosis Date   Anemia    Causing  interruption in anticoagulation   Atrial fibrillation and flutter (HCC)    BPH (benign prostatic hypertrophy)    Colon cancer (HCC)    Status post LAR 2008   Dysrhythmia    Hypothyroidism    Lacunar infarction (HCC) 11/2012   RT 3rd NERVE PALSY, SB Dr. Lita Mains   PE (pulmonary embolism)    Temporarily on Eliquis 2017   Skin cancer    35 years ago   Type 2 diabetes mellitus (HCC)    Vitamin B 12 deficiency 07/10/2019    Past Surgical History:  Procedure Laterality Date   ABDOMINOPERINEAL PROCTOCOLECTOMY  2009   end colostomy    BIOPSY  08/25/2018   Procedure: BIOPSY;  Surgeon: Malissa Hippo, MD;  [facility];  Service: Endoscopy;;   BIOPSY  10/04/2021   Procedure: BIOPSY;  Surgeon: Malissa Hippo, MD;  [facility];  Service: Endoscopy;;  small, bowel   BLADDER REPAIR  08/29/2018   Procedure: BLADDER REPAIR;  Surgeon: Lucretia Roers, MD;  [facility]: [facility];  Service: General;;   COLECTOMY  2008   COLONOSCOPY WITH PROPOFOL N/A 08/25/2018   Procedure: COLONOSCOPY WITH PROPOFOL;  Surgeon: Malissa Hippo, MD;  [facility];  Service: Endoscopy;  Laterality: N/A;  2:20   COLOSTOMY  ESOPHAGOGASTRODUODENOSCOPY (EGD) WITH PROPOFOL N/A 05/10/2023   Procedure: ESOPHAGOGASTRODUODENOSCOPY (EGD) WITH PROPOFOL;  Surgeon: Dolores Frame, MD;  [facility];  Service: Gastroenterology;  Laterality: N/A;  11:00AM;ASA 3   EXPLORATORY LAPAROTOMY  2017   ? pneumoperitoneum of unknown etiology/ negative Ex lap   GIVENS CAPSULE STUDY  10/04/2021   Procedure: GIVENS CAPSULE STUDY;  Surgeon: Malissa Hippo, MD;  [facility];  Service: Endoscopy;;   ILEOSCOPY N/A 10/04/2021   Procedure: ILEOSCOPY THROUGH STOMA;  Surgeon: Malissa Hippo, MD;  [facility];  Service: Endoscopy;  Laterality: N/A;   ILEOSTOMY  08/29/2018   Procedure: ILEOSTOMY;  Surgeon: Lucretia Roers, MD;  [facility];  Service: General;;    LAPAROSCOPIC CHOLECYSTECTOMY  2007   LYSIS OF ADHESION  08/29/2018   Procedure: LYSIS OF ADHESION;  Surgeon: Lucretia Roers, MD;  [facility];  Service: General;;   PARTIAL COLECTOMY N/A 08/29/2018   Procedure: PARTIAL COLECTOMY;  Surgeon: Lucretia Roers, MD;  [facility];  Service: General;  Laterality: N/A;   PORTACATH PLACEMENT Left 10/04/2018   Procedure: INSERTION PORT-A-CATH (attached catheter left subclavian);  Surgeon: Lucretia Roers, MD;  Location[facility];  Service: General;  Laterality: Left;   SBO SURGERY  06/2009     HPI  from the history and physical done on the day of admission:   Jose Lawrence  is a 82 y.o. 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 bili 3.9 alk phos 128 -UA with glucose, Hgb, leukocyte esterase, and bacteria as well as up to 10 WBCs -Hemoglobin 10.9, WBC 10.4 platelets 62K -INR 2.3 -Lactic acid 1.8 -COVID, RSV and flu negative -Chest x-ray without acute cardiopulmonary findings   Hospital Course:   Brief Narrative:  82 y.o. 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 Renold Don--- recommends discharge on amoxicillin 1 g 3 times daily for additional 8 days to complete 10 days of therapy -If recurrent would repeat evaluation as above and consider pet scan/tagged wbc scan if nothing obvious -Patient without obvious skin or joint infections   2)ESBL Klebsiella UTI with Hematuria--suspect this is due to UTI -IV Rocephin pending culture data -Persistent Hematuria--patient had cystoscopy on 05/30/2023 with no significant urothelial lesion -Patient was seen by urologist Dr. Retta Diones for follow-up on 06/07/2023--- due to persistent hematuria -Continue Flomax --Discussed  with ID physician Dr. Renold Don who recommends against treatment for this particular ESBL Klebsiella which is probably contaminant in the setting of chronic hematuria -Patient is clinically improved please see #1 above for antibiotic choices   3)Acute Urinary retention with hydroureteronephrosis  Secondary to BPH -Managed by urology as outpatient -Please see #2 above   4) chronic anemia and thrombocytopenia--- suspect this is due to underlying liver cirrhosis -Both hemoglobin and platelets appear to be close to recent baseline -Repeat CBC as outpatient   5)Non-alcoholic cirrhosis of liver ----?? Chemo/medication induced -LFTs noted Clinically with no significant ascites.  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 Last follow up with no recurrence.    7)Atrial fibrillation,  chronic -Continue Coreg for rate control,  not on anticoagulation-due to bleeding concerns in the setting of persistent hematuria and persistent thrombocytopenia and anemia due to cirrhosis   8)Controlled type 2 diabetes mellitus without complication  Diet controlled at this time   9)Hypothyroidism Continue with levothyroxine.    10)DM2--history of uncontrolled DM -Resume Glipizide   Disposition: The patient is from: Home              Anticipated d/c is to: Home  Discharge Condition: stable  Follow UP   Follow-up Information     Vyas, Dhruv B, MD. Schedule an appointment as soon as possible for a visit in 1 week(s).   Specialty: Internal Medicine Why: Repeat CBC and BMP Blood Tests in 1 week Contact information: [address] [place] [address] [phone]                 Consults obtained -infectious disease  Diet and Activity recommendation:  As advised  Discharge Instructions    Discharge Instructions     Call MD for:  difficulty breathing, headache or visual disturbances   Complete by: As directed    Call MD for:  persistant dizziness or light-headedness   Complete by: As directed    Call MD for:  persistant nausea and vomiting   Complete by: As directed    Call MD for:  temperature >100.4   Complete by: As directed    Diet - low sodium heart healthy   Complete by: As directed    Discharge instructions   Complete by: As directed    1)Avoid ibuprofen/Advil/Aleve/Motrin/Goody Powders/Naproxen/BC powders/Meloxicam/Diclofenac/Indomethacin and other Nonsteroidal anti-inflammatory medications as these will make you more likely to bleed and can cause stomach ulcers, can also cause Kidney problems.   2)Take Amoxicillin antibiotic as prescribed for the next 8 days  3) follow-up with primary care physician in 1 week for recheck and reevaluation----repeat CBC and BMP Blood Tests with PCP within a week   Increase activity slowly   Complete by: As directed         Discharge Medications     Allergies as of 06/10/2023   No Known Allergies      Medication List     STOP taking these medications    acetaminophen 325 MG suppository Commonly known as: TYLENOL   doxycycline 100 MG capsule Commonly known as: VIBRAMYCIN       TAKE these medications    amoxicillin 500 MG capsule Commonly known as: AMOXIL Take 2 capsules (1,000 mg total) by mouth 3 (three) times daily for 8 days.   carvedilol 3.125 MG tablet Commonly known as: Coreg Take 1 tablet (3.125 mg total) by mouth 2 (  two) times daily with a meal.   furosemide 40 MG tablet Commonly known as: LASIX Take 20-40 mg by mouth daily as needed.   glipiZIDE 5 MG tablet Commonly known as: GLUCOTROL Take 10 mg by mouth every morning.   lactose free nutrition Liqd Take 237 mLs by mouth daily at 6 (six) AM.   levothyroxine 50 MCG tablet Commonly known as: SYNTHROID Take 50 mcg by mouth daily before breakfast.   loperamide 2 MG capsule Commonly known as: IMODIUM Take 2 capsules (4 mg total) by mouth 4 (four) times daily -  before meals and at bedtime.   OneTouch Verio test strip Generic drug: glucose blood 1 each 2 (two) times daily.   potassium chloride 10 MEQ tablet Commonly known as: KLOR-CON Take 1 tablet (10 mEq total) by mouth daily as needed. Take when you take Lasix/Furosemide What changed: additional instructions   tamsulosin 0.4 MG Caps capsule Commonly known as: FLOMAX Take 1 capsule (0.4 mg total) by mouth daily after supper.   Vitamin D (Ergocalciferol) 1.25 MG (50000 UNIT) Caps capsule Commonly known as: DRISDOL Take 1 capsule by mouth once a week       Major procedures and Radiology Reports - PLEASE review detailed and final reports for all details, in brief -   ECHOCARDIOGRAM COMPLETE  Result Date: 06/09/2023    ECHOCARDIOGRAM REPORT   Patient Name:   Jose Lawrence Date of Exam: 06/09/2023 Medical Rec #:  [id number]         Height:       72.0 in  Accession #:    [id number]        Weight:       197.0 lb Date of Birth:  05-05-1941         BSA:          2.117 m Patient Age:    82 years          BP:           105/52 mmHg Patient Gender: M                 HR:           46 bpm. [facility] Procedure: 2D Echo, Cardiac Doppler and Color Doppler Indications:    Bacteremia R78.81  History:        Patient has prior history of Echocardiogram examinations, most                 recent 07/10/2018. Arrythmias:Atrial Fibrillation; Risk                 Factors:Hypertension and Diabetes.  Sonographer:    Celesta Gentile RCS Referring Phys: [id number] TRUNG T VU IMPRESSIONS  1. Left ventricular ejection fraction, by estimation, is 60 to 65%. The left ventricle has normal function. The left ventricle has no regional wall motion abnormalities. Left ventricular diastolic function could not be evaluated. Elevated left ventricular end-diastolic pressure.  2. Right ventricular systolic function is normal. The right ventricular size is moderately enlarged. There is moderately elevated pulmonary artery systolic pressure. The estimated right ventricular systolic pressure is 45.9 mmHg.  3. Left atrial size was moderately dilated.  4. Right atrial size was moderately dilated.  5. The mitral valve is normal in structure. Mild mitral valve regurgitation. No evidence of mitral stenosis.  6. The aortic valve is tricuspid. Aortic valve regurgitation is not visualized. No aortic stenosis is present.  7. The inferior vena cava is dilated  in size with <50% respiratory variability, suggesting right atrial pressure of 15 mmHg.  8. Increased flow velocities may be secondary to anemia, thyrotoxicosis, hyperdynamic or high flow state. Comparison(s): No prior Echocardiogram. Conclusion(s)/Recommendation(s): No evidence of valvular vegetations on this transthoracic echocardiogram. Consider a transesophageal echocardiogram to exclude infective endocarditis if clinically indicated. FINDINGS  Left  Ventricle: Left ventricular ejection fraction, by estimation, is 60 to 65%. The left ventricle has normal function. The left ventricle has no regional wall motion abnormalities. The left ventricular internal cavity size was normal in size. There is  no left ventricular hypertrophy. Left ventricular diastolic function could not be evaluated due to atrial fibrillation. Left ventricular diastolic function could not be evaluated. Elevated left ventricular end-diastolic pressure. Right Ventricle: The right ventricular size is moderately enlarged. No increase in right ventricular wall thickness. Right ventricular systolic function is normal. There is moderately elevated pulmonary artery systolic pressure. The tricuspid regurgitant  velocity is 2.78 m/s, and with an assumed right atrial pressure of 15 mmHg, the estimated right ventricular systolic pressure is 45.9 mmHg. Left Atrium: Left atrial size was moderately dilated. Right Atrium: Right atrial size was moderately dilated. Pericardium: There is no evidence of pericardial effusion. Mitral Valve: The mitral valve is normal in structure. Mild mitral valve regurgitation. No evidence of mitral valve stenosis. Tricuspid Valve: The tricuspid valve is normal in structure. Tricuspid valve regurgitation is mild . No evidence of tricuspid stenosis. Aortic Valve: The aortic valve is tricuspid. Aortic valve regurgitation is not visualized. No aortic stenosis is present. Pulmonic Valve: The pulmonic valve was normal in structure. Pulmonic valve regurgitation is not visualized. No evidence of pulmonic stenosis. Aorta: The aortic root is normal in size and structure. Venous: The inferior vena cava is dilated in size with less than 50% respiratory variability, suggesting right atrial pressure of 15 mmHg. IAS/Shunts: No atrial level shunt detected by color flow Doppler. Additional Comments: Moderate ascites is present.  LEFT VENTRICLE PLAX 2D LVIDd:         4.70 cm   Diastology LVIDs:          3.00 cm   LV e' medial:    5.98 cm/s LV PW:         1.00 cm   LV E/e' medial:  18.4 LV IVS:        1.00 cm   LV e' lateral:   9.90 cm/s LVOT diam:     2.00 cm   LV E/e' lateral: 11.1 LV SV:         96 LV SV Index:   45 LVOT Area:     3.14 cm  RIGHT VENTRICLE RV S prime:     11.50 cm/s TAPSE (M-mode): 1.8 cm LEFT ATRIUM            Index        RIGHT ATRIUM           Index LA diam:      4.40 cm  2.08 cm/m   RA Area:     25.70 cm LA Vol (A2C): 88.7 ml  41.90 ml/m  RA Volume:   81.50 ml  38.50 ml/m LA Vol (A4C): 127.0 ml 59.99 ml/m  AORTIC VALVE LVOT Vmax:   127.00 cm/s LVOT Vmean:  81.800 cm/s LVOT VTI:    0.304 m  AORTA Ao Root diam: 3.60 cm MITRAL VALVE                TRICUSPID VALVE MV Area (PHT): 2.20 cm  TR Peak grad:   30.9 mmHg MV Decel Time: 345 msec     TR Vmax:        278.00 cm/s MV E velocity: 110.00 cm/s                             SHUNTS                             Systemic VTI:  0.30 m                             Systemic Diam: 2.00 cm Jose Lawrence Electronically signed by Winfield Rast Lawrence Signature Date/Time: 06/09/2023/2:32:08 PM    Final    DG Chest Port 1 View  Result Date: 06/08/2023 CLINICAL DATA:  Questionable sepsis. Assess for infiltrate. Fever, nausea and vomiting. EXAM: PORTABLE CHEST 1 VIEW COMPARISON:  Portable chest 04/22/2023 FINDINGS: Left subclavian port catheter again terminates in the mid SVC. There is mild cardiomegaly, interval improvement in central vascular prominence. No interstitial edema is seen. There are chronic interstitial changes in both bases. No focal pneumonia is evident. There are bilateral minimal pleural effusions, similar to previous study. The mediastinum is stable. There is a small hiatal hernia. Mild aortic calcifications. No new osseous findings.  Osteopenia and thoracic spondylosis. IMPRESSION: 1. No evidence of pneumonia. 2. Mild cardiomegaly with interval improvement in central vascular prominence. 3. Minimal bilateral  pleural effusions, similar to previous study. 4. Small hiatal hernia. Electronically Signed   By: Almira Bar M.D.   On: 06/08/2023 07:28    Micro Results   Recent Results (from the past 240 hour(s))  Microscopic Examination     Status: Abnormal   Collection Time: 06/07/23 11:16 AM   Urine  Result Value Ref Range Status   WBC, UA 6-10 (A) 0 - 5 /hpf Final   RBC, Urine >30 (A) 0 - 2 /hpf Final   Epithelial Cells (non renal) 0-10 0 - 10 /hpf Final   Crystals Present (A) N/A Final   Crystal Type Amorphous Sediment N/A Final   Bacteria, UA Few (A) None seen/Few Final  Blood Culture (routine x 2)     Status: Abnormal   Collection Time: 06/08/23  6:36 AM   Specimen: BLOOD  Result Value Ref Range Status   Specimen Description   Final    BLOOD BLOOD RIGHT ARM Performed at [facility], [address], [place], [place] [address]    Special Requests   Final    BOTTLES DRAWN AEROBIC AND ANAEROBIC Blood Culture results may not be optimal due to an excessive volume of blood received in culture bottles Performed at [facility], [address] [place], [place] [address]    Culture  Setup Time   Final    GRAM POSITIVE COCCI Gram Stain Report Called to,Read Back By and Verified With: WAGONER,R ON 06/08/23 AT 2250 BY LOY,C AEROBIC BOTTLE CRITICAL RESULT CALLED TO, READ BACK BY AND VERIFIED WITH: Ardis Rowan RN 06/08/23 @ 2355 BY AB Performed at [facility], [address] [place], [place] [address]    Culture GROUP B STREP(S.AGALACTIAE)ISOLATED (A)  Final   Report Status 06/10/2023 FINAL  Final   Organism ID, Bacteria GROUP B STREP(S.AGALACTIAE)ISOLATED  Final      Susceptibility   Group b strep(s.agalactiae)isolated - MIC*    CLINDAMYCIN <=  0.25 SENSITIVE Sensitive     AMPICILLIN <=0.25 SENSITIVE Sensitive     VANCOMYCIN 0.5 SENSITIVE Sensitive     CEFTRIAXONE <=0.12 SENSITIVE Sensitive     PENICILLIN <=0.06 SENSITIVE Sensitive     * GROUP B STREP(S.AGALACTIAE)ISOLATED  Blood  Culture (routine x 2)     Status: None (Preliminary result)   Collection Time: 06/08/23  6:36 AM   Specimen: BLOOD  Result Value Ref Range Status   Specimen Description BLOOD BLOOD LEFT ARM  Final   Special Requests   Final    BOTTLES DRAWN AEROBIC AND ANAEROBIC Blood Culture results may not be optimal due to an excessive volume of blood received in culture bottles   Culture   Final    NO GROWTH 2 DAYS Performed at [facility], [address] [place], [place] [address]    Report Status PENDING  Incomplete  Blood Culture ID Panel (Reflexed)     Status: Abnormal   Collection Time: 06/08/23  6:36 AM  Result Value Ref Range Status   Enterococcus faecalis NOT DETECTED NOT DETECTED Final   Enterococcus Faecium NOT DETECTED NOT DETECTED Final   Listeria monocytogenes NOT DETECTED NOT DETECTED Final   Staphylococcus species NOT DETECTED NOT DETECTED Final   Staphylococcus aureus (BCID) NOT DETECTED NOT DETECTED Final   Staphylococcus epidermidis NOT DETECTED NOT DETECTED Final   Staphylococcus lugdunensis NOT DETECTED NOT DETECTED Final   Streptococcus species DETECTED (A) NOT DETECTED Final    Comment: CRITICAL RESULT CALLED TO, READ BACK BY AND VERIFIED WITH: RArdelle Anton RN 06/08/23 @ 2355 BY AB    Streptococcus agalactiae DETECTED (A) NOT DETECTED Final    Comment: CRITICAL RESULT CALLED TO, READ BACK BY AND VERIFIED WITH: RArdelle Anton RN 06/08/23 @ 2355 BY AB    Streptococcus pneumoniae NOT DETECTED NOT DETECTED Final   Streptococcus pyogenes NOT DETECTED NOT DETECTED Final   A.calcoaceticus-baumannii NOT DETECTED NOT DETECTED Final   Bacteroides fragilis NOT DETECTED NOT DETECTED Final   Enterobacterales NOT DETECTED NOT DETECTED Final   Enterobacter cloacae complex NOT DETECTED NOT DETECTED Final   Escherichia coli NOT DETECTED NOT DETECTED Final   Klebsiella aerogenes NOT DETECTED NOT DETECTED Final   Klebsiella oxytoca NOT DETECTED NOT DETECTED Final   Klebsiella pneumoniae NOT  DETECTED NOT DETECTED Final   Proteus species NOT DETECTED NOT DETECTED Final   Salmonella species NOT DETECTED NOT DETECTED Final   Serratia marcescens NOT DETECTED NOT DETECTED Final   Haemophilus influenzae NOT DETECTED NOT DETECTED Final   Neisseria meningitidis NOT DETECTED NOT DETECTED Final   Pseudomonas aeruginosa NOT DETECTED NOT DETECTED Final   Stenotrophomonas maltophilia NOT DETECTED NOT DETECTED Final   Candida albicans NOT DETECTED NOT DETECTED Final   Candida auris NOT DETECTED NOT DETECTED Final   Candida glabrata NOT DETECTED NOT DETECTED Final   Candida krusei NOT DETECTED NOT DETECTED Final   Candida parapsilosis NOT DETECTED NOT DETECTED Final   Candida tropicalis NOT DETECTED NOT DETECTED Final   Cryptococcus neoformans/gattii NOT DETECTED NOT DETECTED Final    Comment: Performed at [facility], [address] [place], [place] [address]  Urine Culture     Status: Abnormal   Collection Time: 06/08/23  7:51 AM   Specimen: Urine, Clean Catch  Result Value Ref Range Status   Specimen Description   Final    URINE, CLEAN CATCH Performed at [facility], [address], [place], [place] [address]    Special Requests   Final  NONE Performed at [facility], [address], [place], [place] [address]    Culture (A)  Final    20,000 COLONIES/mL KLEBSIELLA PNEUMONIAE Confirmed Extended Spectrum Beta-Lactamase Producer (ESBL).  In bloodstream infections from ESBL organisms, carbapenems are preferred over piperacillin/tazobactam. They are shown to have a lower risk of mortality.    Report Status 06/10/2023 FINAL  Final   Organism ID, Bacteria KLEBSIELLA PNEUMONIAE (A)  Final      Susceptibility   Klebsiella pneumoniae - MIC*    AMPICILLIN >=32 RESISTANT Resistant     CEFAZOLIN >=64 RESISTANT Resistant     CEFEPIME >=32 RESISTANT Resistant     CEFTRIAXONE >=64 RESISTANT Resistant     CIPROFLOXACIN 0.5 INTERMEDIATE Intermediate     GENTAMICIN <=1 SENSITIVE  Sensitive     IMIPENEM <=0.25 SENSITIVE Sensitive     NITROFURANTOIN 128 RESISTANT Resistant     TRIMETH/SULFA >=320 RESISTANT Resistant     AMPICILLIN/SULBACTAM >=32 RESISTANT Resistant     PIP/TAZO 8 SENSITIVE Sensitive ug/mL    * 20,000 COLONIES/mL KLEBSIELLA PNEUMONIAE  Resp panel by RT-PCR (RSV, Flu A&B, Covid) Anterior Nasal Swab     Status: None   Collection Time: 06/08/23  9:04 AM   Specimen: Anterior Nasal Swab  Result Value Ref Range Status   SARS Coronavirus 2 by RT PCR NEGATIVE NEGATIVE Final    Comment: (NOTE) SARS-CoV-2 target nucleic acids are NOT DETECTED.  The SARS-CoV-2 RNA is generally detectable in upper respiratory specimens during the acute phase of infection. The lowest concentration of SARS-CoV-2 viral copies this assay can detect is 138 copies/mL. A negative result does not preclude SARS-Cov-2 infection and should not be used as the sole basis for treatment or other patient management decisions. A negative result may occur with  improper specimen collection/handling, submission of specimen other than nasopharyngeal swab, presence of viral mutation(s) within the areas targeted by this assay, and inadequate number of viral copies(<138 copies/mL). A negative result must be combined with clinical observations, patient history, and epidemiological information. The expected result is Negative.  Fact Sheet for Patients:  BloggerCourse.com  Fact Sheet for Healthcare Providers:  SeriousBroker.it  This test is no t yet approved or cleared by the [place] FDA and  has been authorized for detection and/or diagnosis of SARS-CoV-2 by FDA under an Emergency Use Authorization (EUA). This EUA will remain  in effect (meaning this test can be used) for the duration of the COVID-19 declaration under Section 564(b)(1) of the Act, 21 U.S.C.section 360bbb-3(b)(1), unless the authorization is terminated  or revoked sooner.        Influenza A by PCR NEGATIVE NEGATIVE Final   Influenza B by PCR NEGATIVE NEGATIVE Final    Comment: (NOTE) The Xpert Xpress SARS-CoV-2/FLU/RSV plus assay is intended as an aid in the diagnosis of influenza from Nasopharyngeal swab specimens and should not be used as a sole basis for treatment. Nasal washings and aspirates are unacceptable for Xpert Xpress SARS-CoV-2/FLU/RSV testing.  Fact Sheet for Patients: BloggerCourse.com  Fact Sheet for Healthcare Providers: SeriousBroker.it  This test is not yet approved or cleared by the [place] FDA and has been authorized for detection and/or diagnosis of SARS-CoV-2 by FDA under an Emergency Use Authorization (EUA). This EUA will remain in effect (meaning this test can be used) for the duration of the COVID-19 declaration under Section 564(b)(1) of the Act, 21 U.S.C. section 360bbb-3(b)(1), unless the authorization is terminated or revoked.     Resp Syncytial Virus by PCR NEGATIVE  NEGATIVE Final    Comment: (NOTE) Fact Sheet for Patients: BloggerCourse.com  Fact Sheet for Healthcare Providers: SeriousBroker.it  This test is not yet approved or cleared by the [place] FDA and has been authorized for detection and/or diagnosis of SARS-CoV-2 by FDA under an Emergency Use Authorization (EUA). This EUA will remain in effect (meaning this test can be used) for the duration of the COVID-19 declaration under Section 564(b)(1) of the Act, 21 U.S.C. section 360bbb-3(b)(1), unless the authorization is terminated or revoked.  Performed at [facility], [address] [place], [place] [address]   Culture, blood (Routine X 2) w Reflex to ID Panel     Status: None (Preliminary result)   Collection Time: 06/09/23  9:35 AM   Specimen: BLOOD  Result Value Ref Range Status   Specimen Description BLOOD LAC  Final   Special Requests    Final    BOTTLES DRAWN AEROBIC AND ANAEROBIC Blood Culture results may not be optimal due to an excessive volume of blood received in culture bottles LEFT ANTECUBITAL   Culture   Final    NO GROWTH < 24 HOURS Performed at [facility], [address] [place], [place] [address]    Report Status PENDING  Incomplete  Culture, blood (Routine X 2) w Reflex to ID Panel     Status: None (Preliminary result)   Collection Time: 06/09/23  9:35 AM   Specimen: BLOOD  Result Value Ref Range Status   Specimen Description BLOOD  Final   Special Requests   Final    BOTTLES DRAWN AEROBIC AND ANAEROBIC Blood Culture adequate volume BLOOD LEFT FOREARM   Culture   Final    NO GROWTH < 24 HOURS Performed at [facility], [address] [place], [place] [address]    Report Status PENDING  Incomplete   Today   Subjective    Jose Lawrence today has no new complaints -Hematuria resolving -No dysuria, no flank pain No fever  Or chills   No Nausea, Vomiting or Diarrhea         Patient has been seen and examined prior to discharge   Objective   Blood pressure 135/73, pulse (!) 54, temperature 97.9 F (36.6 C), temperature source Oral, resp. rate 20, height 6' (1.829 m), weight 89.4 kg, SpO2 100%.   Intake/Output Summary (Last 24 hours) at 06/10/2023 1000 Last data filed at 06/09/2023 1100 Gross per 24 hour  Intake 240 ml  Output --  Net 240 ml    Exam Gen:- Awake Alert, no acute distress  HEENT:- [place].AT, No sclera icterus Neck-Supple Neck,No JVD,.  Lungs-  CTAB , good air movement bilaterally CV- S1, S2 normal, regular Abd-  +ve B.Sounds, Abd Soft, No tenderness, no CVA area tenderness, ostomy bag with fecal material Extremity/Skin:- No  edema,   good pulses Psych-affect is appropriate, oriented x3 Neuro-no new focal deficits, no tremors    Data Review   CBC w Diff:  Lab Results  Component Value Date   WBC 10.4 06/08/2023   HGB 10.9 (L) 06/08/2023   HGB 10.9 (L) 06/07/2023    HCT 32.5 (L) 06/08/2023   HCT 31.4 (L) 06/07/2023   PLT 62 (L) 06/08/2023   PLT 59 (LL) 06/07/2023   LYMPHOPCT 6 06/08/2023   MONOPCT 6 06/08/2023   EOSPCT 2 06/08/2023   BASOPCT 0 06/08/2023   CMP:  Lab Results  Component Value Date   NA 132 (L) 06/08/2023   NA 137 06/07/2023   K 4.4  06/08/2023   CL 103 06/08/2023   CO2 23 06/08/2023   BUN 14 06/08/2023   BUN 12 06/07/2023   CREATININE 1.09 06/08/2023   PROT 7.1 06/08/2023   PROT 6.5 06/07/2023   ALBUMIN 2.5 (L) 06/08/2023   ALBUMIN 2.7 (L) 06/07/2023   BILITOT 3.9 (H) 06/08/2023   BILITOT 2.8 (H) 06/07/2023   ALKPHOS 128 (H) 06/08/2023   AST 65 (H) 06/08/2023   ALT 40 06/08/2023   Total Discharge time is about 33 minutes  Shon Hale M.D on 06/10/2023 at 10:00 AM  Go to [email or web address] -  for contact info  [facility] - Office  [phone]

## 2023-06-10 NOTE — Plan of Care (Signed)
Pt afebrile. Alert and oriented x4. Up adlib in room no tele events this shift.  Problem: Education: Goal: Knowledge of General Education information will improve Description: Including pain rating scale, medication(s)/side effects and non-pharmacologic comfort measures Outcome: Progressing   Problem: Health Behavior/Discharge Planning: Goal: Ability to manage health-related needs will improve Outcome: Progressing   Problem: Clinical Measurements: Goal: Ability to maintain clinical measurements within normal limits will improve Outcome: Progressing Goal: Will remain free from infection Outcome: Progressing Goal: Diagnostic test results will improve Outcome: Progressing Goal: Respiratory complications will improve Outcome: Progressing Goal: Cardiovascular complication will be avoided Outcome: Progressing   Problem: Activity: Goal: Risk for activity intolerance will decrease Outcome: Progressing   Problem: Nutrition: Goal: Adequate nutrition will be maintained Outcome: Progressing   Problem: Coping: Goal: Level of anxiety will decrease Outcome: Progressing   Problem: Elimination: Goal: Will not experience complications related to bowel motility Outcome: Progressing Goal: Will not experience complications related to urinary retention Outcome: Progressing   Problem: Pain Management: Goal: General experience of comfort will improve Outcome: Progressing   Problem: Safety: Goal: Ability to remain free from injury will improve Outcome: Progressing   Problem: Skin Integrity: Goal: Risk for impaired skin integrity will decrease Outcome: Progressing   Problem: Education: Goal: Ability to describe self-care measures that may prevent or decrease complications (Diabetes Survival Skills Education) will improve Outcome: Progressing Goal: Individualized Educational Video(s) Outcome: Progressing   Problem: Coping: Goal: Ability to adjust to condition or change in health will  improve Outcome: Progressing   Problem: Fluid Volume: Goal: Ability to maintain a balanced intake and output will improve Outcome: Progressing   Problem: Health Behavior/Discharge Planning: Goal: Ability to identify and utilize available resources and services will improve Outcome: Progressing Goal: Ability to manage health-related needs will improve Outcome: Progressing   Problem: Metabolic: Goal: Ability to maintain appropriate glucose levels will improve Outcome: Progressing   Problem: Nutritional: Goal: Maintenance of adequate nutrition will improve Outcome: Progressing Goal: Progress toward achieving an optimal weight will improve Outcome: Progressing   Problem: Skin Integrity: Goal: Risk for impaired skin integrity will decrease Outcome: Progressing   Problem: Tissue Perfusion: Goal: Adequacy of tissue perfusion will improve Outcome: Progressing

## 2023-06-10 NOTE — Progress Notes (Signed)
IV removed from patients RFA , had episode of bleeding cleaned up and stopped bleeding with extra gauze. Patient given discharge summary and follow up labs and appointments . Transported via wheelchair to personal vehicle .

## 2023-06-11 LAB — URINE CULTURE

## 2023-06-13 LAB — CULTURE, BLOOD (ROUTINE X 2): Culture: NO GROWTH

## 2023-06-14 LAB — CULTURE, BLOOD (ROUTINE X 2)
Culture: NO GROWTH
Culture: NO GROWTH

## 2023-06-16 DIAGNOSIS — Z299 Encounter for prophylactic measures, unspecified: Secondary | ICD-10-CM | POA: Diagnosis not present

## 2023-06-16 DIAGNOSIS — Z09 Encounter for follow-up examination after completed treatment for conditions other than malignant neoplasm: Secondary | ICD-10-CM | POA: Diagnosis not present

## 2023-06-16 DIAGNOSIS — G620 Drug-induced polyneuropathy: Secondary | ICD-10-CM | POA: Diagnosis not present

## 2023-06-16 DIAGNOSIS — I10 Essential (primary) hypertension: Secondary | ICD-10-CM | POA: Diagnosis not present

## 2023-06-16 DIAGNOSIS — D696 Thrombocytopenia, unspecified: Secondary | ICD-10-CM | POA: Diagnosis not present

## 2023-06-16 DIAGNOSIS — I152 Hypertension secondary to endocrine disorders: Secondary | ICD-10-CM | POA: Diagnosis not present

## 2023-06-16 DIAGNOSIS — E1159 Type 2 diabetes mellitus with other circulatory complications: Secondary | ICD-10-CM | POA: Diagnosis not present

## 2023-06-16 DIAGNOSIS — Z23 Encounter for immunization: Secondary | ICD-10-CM | POA: Diagnosis not present

## 2023-07-22 DIAGNOSIS — Z79899 Other long term (current) drug therapy: Secondary | ICD-10-CM | POA: Diagnosis not present

## 2023-07-22 DIAGNOSIS — Z7189 Other specified counseling: Secondary | ICD-10-CM | POA: Diagnosis not present

## 2023-07-22 DIAGNOSIS — Z0000 Encounter for general adult medical examination without abnormal findings: Secondary | ICD-10-CM | POA: Diagnosis not present

## 2023-07-22 DIAGNOSIS — Z1339 Encounter for screening examination for other mental health and behavioral disorders: Secondary | ICD-10-CM | POA: Diagnosis not present

## 2023-07-22 DIAGNOSIS — Z1331 Encounter for screening for depression: Secondary | ICD-10-CM | POA: Diagnosis not present

## 2023-07-22 DIAGNOSIS — E7800 Pure hypercholesterolemia, unspecified: Secondary | ICD-10-CM | POA: Diagnosis not present

## 2023-07-22 DIAGNOSIS — Z125 Encounter for screening for malignant neoplasm of prostate: Secondary | ICD-10-CM | POA: Diagnosis not present

## 2023-07-22 DIAGNOSIS — Z299 Encounter for prophylactic measures, unspecified: Secondary | ICD-10-CM | POA: Diagnosis not present

## 2023-07-22 DIAGNOSIS — I10 Essential (primary) hypertension: Secondary | ICD-10-CM | POA: Diagnosis not present

## 2023-07-22 DIAGNOSIS — R5383 Other fatigue: Secondary | ICD-10-CM | POA: Diagnosis not present

## 2023-07-25 DIAGNOSIS — E113392 Type 2 diabetes mellitus with moderate nonproliferative diabetic retinopathy without macular edema, left eye: Secondary | ICD-10-CM | POA: Diagnosis not present

## 2023-07-25 DIAGNOSIS — E113291 Type 2 diabetes mellitus with mild nonproliferative diabetic retinopathy without macular edema, right eye: Secondary | ICD-10-CM | POA: Diagnosis not present

## 2023-08-29 NOTE — Progress Notes (Signed)
History of Present Illness: Here for follow-up of hematuria, bladder abnormality.  9.30.2024: Initial visit following admission to the hospital for treatment of bacteremia, acute retention with bilateral hydronephrosis.  He was treated with Foley catheter placement.  He was started on tamsulosin.  CT scan was performed at the time of his presentation to the emergency room.  He had a cirrhotic liver, small volume ascites, bilateral hydroureteronephrosis to the bladder, small enhancing left-sided bladder wall abnormality.  Prior to presentation, he was on Flomax for about 2 to 3 years.  Despite that he is having a slow stream, feeling of incomplete emptying, urgency and occasional urgency incontinence.  He passed a TOV in the office. Followup bladder scan a week later revealed a pvr volume of 0.  10.21.2024: Here for cysto to f/u bladder abnormality on CT.  Cystoscopy revealed no significant urothelial lesion.  The patient does tell me that he had a bladder injury during his colon cancer surgery years ago.  10.29.2024: Here today for recheck.  Does complain of significant fatigue.  Occasional gross hematuria.  No pain with urination.  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 Lita Mains   PE (pulmonary embolism)    Temporarily on Eliquis 2017   Skin cancer    35 years ago   Type 2 diabetes mellitus (HCC)    Vitamin B 12 deficiency 07/10/2019    Past Surgical History:  Procedure Laterality Date   ABDOMINOPERINEAL PROCTOCOLECTOMY  2009   end colostomy    BIOPSY  08/25/2018   Procedure: BIOPSY;  Surgeon: Malissa Hippo, MD;  [facility]: [facility];  Service: Endoscopy;;   BIOPSY  10/04/2021   Procedure: BIOPSY;   Surgeon: Malissa Hippo, MD;  [facility]: [facility];  Service: Endoscopy;;  small, bowel   BLADDER REPAIR  08/29/2018   Procedure: BLADDER REPAIR;  Surgeon: Lucretia Roers, MD;  Location: [facility];  Service: General;;   COLECTOMY  2008   COLONOSCOPY WITH PROPOFOL N/A 08/25/2018   Procedure: COLONOSCOPY WITH PROPOFOL;  Surgeon: Malissa Hippo, MD;  [facility]: [facility];  Service: Endoscopy;  Laterality: N/A;  2:20   COLOSTOMY     ESOPHAGOGASTRODUODENOSCOPY (EGD) WITH PROPOFOL N/A 05/10/2023   Procedure: ESOPHAGOGASTRODUODENOSCOPY (EGD) WITH PROPOFOL;  Surgeon: Dolores Frame, MD;  [facility]: [facility];  Service: Gastroenterology;  Laterality: N/A;  11:00AM;ASA 3   EXPLORATORY LAPAROTOMY  2017   ? pneumoperitoneum of unknown etiology/ negative Ex lap   GIVENS CAPSULE STUDY  10/04/2021   Procedure: GIVENS CAPSULE STUDY;  Surgeon: Malissa Hippo, MD;  [facility];  Service: Endoscopy;;   ILEOSCOPY N/A 10/04/2021   Procedure: ILEOSCOPY THROUGH STOMA;  Surgeon: Malissa Hippo, MD;  [facility]: [facility];  Service: Endoscopy;  Laterality: N/A;   ILEOSTOMY  08/29/2018   Procedure: ILEOSTOMY;  Surgeon: Lucretia Roers, MD;  Location: [facility];  Service: General;;   LAPAROSCOPIC CHOLECYSTECTOMY  2007   LYSIS OF ADHESION  08/29/2018   Procedure: LYSIS OF ADHESION;  Surgeon: Lucretia Roers, MD;  Location: [facility];  Service: General;;   PARTIAL COLECTOMY N/A 08/29/2018   Procedure: PARTIAL COLECTOMY;  Surgeon: Lucretia Roers, MD;  Location: [facility];  Service: General;  Laterality: N/A;   PORTACATH PLACEMENT Left 10/04/2018   Procedure: INSERTION PORT-A-CATH (attached catheter left subclavian);  Surgeon: Lucretia Roers, MD;  Location: [facility];  Service: General;  Laterality: Left;   SBO SURGERY  06/2009    Home Medications:  Allergies as of 08/30/2023   No Known Allergies      Medication List        Accurate as of August 29, 2023 12:59 PM. If you  have any questions, ask your nurse or doctor.          carvedilol 3.125 MG tablet Commonly known as: Coreg Take 1 tablet (3.125 mg total) by mouth 2 (two) times daily with a meal.   furosemide 40 MG tablet Commonly known as: LASIX Take 20-40 mg by mouth daily as needed.   glipiZIDE 5 MG tablet Commonly known as: GLUCOTROL Take 10 mg by mouth every morning.   lactose free nutrition Liqd Take 237 mLs by mouth daily at 6 (six) AM.   levothyroxine 50 MCG tablet Commonly known as: SYNTHROID Take 50 mcg by mouth daily before breakfast.   loperamide 2 MG capsule Commonly known as: IMODIUM Take 2 capsules (4 mg total) by mouth 4 (four) times daily -  before meals and at bedtime.   OneTouch Verio test strip Generic drug: glucose blood 1 each 2 (two) times daily.   potassium chloride 10 MEQ tablet Commonly known as: KLOR-CON Take 1 tablet (10 mEq total) by mouth daily as needed. Take when you take Lasix/Furosemide   tamsulosin 0.4 MG Caps capsule Commonly known as: FLOMAX Take 1 capsule (0.4 mg total) by mouth daily after supper.   Vitamin D (Ergocalciferol) 1.25 MG (50000 UNIT) Caps capsule Commonly known as: DRISDOL Take 1 capsule by mouth once a week        Allergies: No Known Allergies  Family History  Problem Relation Age of Onset   Heart disease Father    Heart failure Father    Heart attack Father    Prostate cancer Paternal Uncle     Social History:  reports that he quit smoking about 32 years ago. His smoking use included cigarettes. He started smoking about 59 years ago. He has a 27 pack-year smoking history. He quit smokeless tobacco use about 28 years ago.  His smokeless tobacco use included chew. He reports that he does not drink alcohol and does not use drugs.  ROS: A complete review of systems was performed.  All systems are negative except for pertinent findings as noted.  Physical Exam:  Vital signs in last 24 hours: There were no vitals taken  for this visit. Constitutional:  Alert and oriented, No acute distress Cardiovascular: Regular rate  Respiratory: Normal respiratory effort GI: Abdomen is soft, nontender, nondistended, no abdominal masses. No CVAT.  Genitourinary: Normal male phallus, testes are descended bilaterally and non-tender and without masses, scrotum is normal in appearance without lesions or masses, perineum is normal on inspection. Lymphatic: No lymphadenopathy Neurologic: Grossly intact, no focal deficits Psychiatric: Normal mood and affect  I have reviewed prior pt notes  I have reviewed urinalysis results  I have independently reviewed prior imaging--CT images reviewed with patient and wife  I have reviewed prior results-last blood studies from mid September reviewed.  He did have a platelet count of 50,000 at that time (known thrombocytopenia)     Impression/Assessment:  Continued gross hematuria.  Normal upper tracts.  Bladder abnormality on CT not seen on cystoscopy  Multiple other constitutional complaints.  He has not seen his PCP in a while  Plan:

## 2023-08-30 ENCOUNTER — Ambulatory Visit (INDEPENDENT_AMBULATORY_CARE_PROVIDER_SITE_OTHER): Payer: Medicare Other | Admitting: Urology

## 2023-08-30 VITALS — BP 167/75 | HR 58

## 2023-08-30 DIAGNOSIS — N138 Other obstructive and reflux uropathy: Secondary | ICD-10-CM

## 2023-08-30 DIAGNOSIS — N1330 Unspecified hydronephrosis: Secondary | ICD-10-CM

## 2023-08-30 DIAGNOSIS — N401 Enlarged prostate with lower urinary tract symptoms: Secondary | ICD-10-CM

## 2023-08-30 DIAGNOSIS — N329 Bladder disorder, unspecified: Secondary | ICD-10-CM

## 2023-08-30 DIAGNOSIS — R310 Gross hematuria: Secondary | ICD-10-CM

## 2023-08-30 LAB — URINALYSIS, ROUTINE W REFLEX MICROSCOPIC
Bilirubin, UA: NEGATIVE
Ketones, UA: NEGATIVE
Leukocytes,UA: NEGATIVE
Nitrite, UA: NEGATIVE
Protein,UA: NEGATIVE
RBC, UA: NEGATIVE
Specific Gravity, UA: 1.03 (ref 1.005–1.030)
Urobilinogen, Ur: 0.2 mg/dL (ref 0.2–1.0)
pH, UA: 6 (ref 5.0–7.5)

## 2023-09-02 ENCOUNTER — Other Ambulatory Visit: Payer: Self-pay | Admitting: Hematology

## 2023-09-02 DIAGNOSIS — E559 Vitamin D deficiency, unspecified: Secondary | ICD-10-CM

## 2023-09-05 ENCOUNTER — Encounter (HOSPITAL_COMMUNITY): Payer: Self-pay | Admitting: Hematology

## 2023-09-19 ENCOUNTER — Emergency Department (HOSPITAL_COMMUNITY): Payer: Medicare Other

## 2023-09-19 ENCOUNTER — Inpatient Hospital Stay (HOSPITAL_COMMUNITY)
Admission: EM | Admit: 2023-09-19 | Discharge: 2023-09-26 | DRG: 315 | Disposition: A | Discharge status: Home Health | Payer: Medicare Other | Attending: Family Medicine | Admitting: Family Medicine

## 2023-09-19 ENCOUNTER — Other Ambulatory Visit: Payer: Self-pay

## 2023-09-19 ENCOUNTER — Encounter (HOSPITAL_COMMUNITY): Payer: Self-pay

## 2023-09-19 DIAGNOSIS — J9819 Other pulmonary collapse: Secondary | ICD-10-CM | POA: Diagnosis present

## 2023-09-19 DIAGNOSIS — N400 Enlarged prostate without lower urinary tract symptoms: Secondary | ICD-10-CM | POA: Diagnosis present

## 2023-09-19 DIAGNOSIS — B958 Unspecified staphylococcus as the cause of diseases classified elsewhere: Secondary | ICD-10-CM

## 2023-09-19 DIAGNOSIS — E8809 Other disorders of plasma-protein metabolism, not elsewhere classified: Secondary | ICD-10-CM | POA: Diagnosis not present

## 2023-09-19 DIAGNOSIS — E872 Acidosis, unspecified: Secondary | ICD-10-CM | POA: Diagnosis not present

## 2023-09-19 DIAGNOSIS — T80211A Bloodstream infection due to central venous catheter, initial encounter: Secondary | ICD-10-CM | POA: Diagnosis present

## 2023-09-19 DIAGNOSIS — I482 Chronic atrial fibrillation, unspecified: Principal | ICD-10-CM | POA: Diagnosis present

## 2023-09-19 DIAGNOSIS — D6959 Other secondary thrombocytopenia: Secondary | ICD-10-CM | POA: Diagnosis present

## 2023-09-19 DIAGNOSIS — I129 Hypertensive chronic kidney disease with stage 1 through stage 4 chronic kidney disease, or unspecified chronic kidney disease: Secondary | ICD-10-CM | POA: Diagnosis present

## 2023-09-19 DIAGNOSIS — E46 Unspecified protein-calorie malnutrition: Secondary | ICD-10-CM | POA: Diagnosis present

## 2023-09-19 DIAGNOSIS — Z86711 Personal history of pulmonary embolism: Secondary | ICD-10-CM

## 2023-09-19 DIAGNOSIS — Z8042 Family history of malignant neoplasm of prostate: Secondary | ICD-10-CM

## 2023-09-19 DIAGNOSIS — R9430 Abnormal result of cardiovascular function study, unspecified: Secondary | ICD-10-CM | POA: Diagnosis not present

## 2023-09-19 DIAGNOSIS — R1110 Vomiting, unspecified: Secondary | ICD-10-CM | POA: Insufficient documentation

## 2023-09-19 DIAGNOSIS — E039 Hypothyroidism, unspecified: Secondary | ICD-10-CM | POA: Diagnosis present

## 2023-09-19 DIAGNOSIS — J9811 Atelectasis: Secondary | ICD-10-CM | POA: Diagnosis not present

## 2023-09-19 DIAGNOSIS — D638 Anemia in other chronic diseases classified elsewhere: Secondary | ICD-10-CM | POA: Diagnosis not present

## 2023-09-19 DIAGNOSIS — Z79890 Hormone replacement therapy (postmenopausal): Secondary | ICD-10-CM

## 2023-09-19 DIAGNOSIS — Z8249 Family history of ischemic heart disease and other diseases of the circulatory system: Secondary | ICD-10-CM

## 2023-09-19 DIAGNOSIS — Z933 Colostomy status: Secondary | ICD-10-CM

## 2023-09-19 DIAGNOSIS — E871 Hypo-osmolality and hyponatremia: Secondary | ICD-10-CM | POA: Diagnosis present

## 2023-09-19 DIAGNOSIS — E1122 Type 2 diabetes mellitus with diabetic chronic kidney disease: Secondary | ICD-10-CM | POA: Diagnosis present

## 2023-09-19 DIAGNOSIS — R509 Fever, unspecified: Principal | ICD-10-CM | POA: Diagnosis present

## 2023-09-19 DIAGNOSIS — E861 Hypovolemia: Secondary | ICD-10-CM | POA: Diagnosis present

## 2023-09-19 DIAGNOSIS — R918 Other nonspecific abnormal finding of lung field: Secondary | ICD-10-CM | POA: Diagnosis not present

## 2023-09-19 DIAGNOSIS — K7469 Other cirrhosis of liver: Secondary | ICD-10-CM | POA: Diagnosis not present

## 2023-09-19 DIAGNOSIS — R001 Bradycardia, unspecified: Secondary | ICD-10-CM | POA: Diagnosis present

## 2023-09-19 DIAGNOSIS — Z85828 Personal history of other malignant neoplasm of skin: Secondary | ICD-10-CM

## 2023-09-19 DIAGNOSIS — D689 Coagulation defect, unspecified: Secondary | ICD-10-CM | POA: Diagnosis present

## 2023-09-19 DIAGNOSIS — C189 Malignant neoplasm of colon, unspecified: Secondary | ICD-10-CM | POA: Diagnosis not present

## 2023-09-19 DIAGNOSIS — Z1152 Encounter for screening for COVID-19: Secondary | ICD-10-CM

## 2023-09-19 DIAGNOSIS — K7460 Unspecified cirrhosis of liver: Secondary | ICD-10-CM | POA: Diagnosis not present

## 2023-09-19 DIAGNOSIS — I517 Cardiomegaly: Secondary | ICD-10-CM | POA: Diagnosis not present

## 2023-09-19 DIAGNOSIS — R0989 Other specified symptoms and signs involving the circulatory and respiratory systems: Secondary | ICD-10-CM | POA: Diagnosis not present

## 2023-09-19 DIAGNOSIS — R188 Other ascites: Secondary | ICD-10-CM | POA: Diagnosis not present

## 2023-09-19 DIAGNOSIS — I38 Endocarditis, valve unspecified: Secondary | ICD-10-CM | POA: Diagnosis not present

## 2023-09-19 DIAGNOSIS — N1831 Chronic kidney disease, stage 3a: Secondary | ICD-10-CM | POA: Diagnosis present

## 2023-09-19 DIAGNOSIS — Z85038 Personal history of other malignant neoplasm of large intestine: Secondary | ICD-10-CM

## 2023-09-19 DIAGNOSIS — I700 Atherosclerosis of aorta: Secondary | ICD-10-CM | POA: Diagnosis not present

## 2023-09-19 DIAGNOSIS — J111 Influenza due to unidentified influenza virus with other respiratory manifestations: Secondary | ICD-10-CM | POA: Diagnosis present

## 2023-09-19 DIAGNOSIS — K766 Portal hypertension: Secondary | ICD-10-CM | POA: Diagnosis present

## 2023-09-19 DIAGNOSIS — R7881 Bacteremia: Secondary | ICD-10-CM | POA: Diagnosis present

## 2023-09-19 DIAGNOSIS — Z87891 Personal history of nicotine dependence: Secondary | ICD-10-CM

## 2023-09-19 DIAGNOSIS — C184 Malignant neoplasm of transverse colon: Secondary | ICD-10-CM | POA: Diagnosis not present

## 2023-09-19 DIAGNOSIS — Z79899 Other long term (current) drug therapy: Secondary | ICD-10-CM

## 2023-09-19 DIAGNOSIS — Z9049 Acquired absence of other specified parts of digestive tract: Secondary | ICD-10-CM

## 2023-09-19 DIAGNOSIS — Z452 Encounter for adjustment and management of vascular access device: Secondary | ICD-10-CM | POA: Diagnosis not present

## 2023-09-19 DIAGNOSIS — J90 Pleural effusion, not elsewhere classified: Secondary | ICD-10-CM | POA: Diagnosis present

## 2023-09-19 DIAGNOSIS — D539 Nutritional anemia, unspecified: Secondary | ICD-10-CM | POA: Diagnosis present

## 2023-09-19 DIAGNOSIS — B957 Other staphylococcus as the cause of diseases classified elsewhere: Secondary | ICD-10-CM | POA: Diagnosis present

## 2023-09-19 DIAGNOSIS — D696 Thrombocytopenia, unspecified: Secondary | ICD-10-CM | POA: Diagnosis present

## 2023-09-19 DIAGNOSIS — Z7984 Long term (current) use of oral hypoglycemic drugs: Secondary | ICD-10-CM

## 2023-09-19 DIAGNOSIS — Z48813 Encounter for surgical aftercare following surgery on the respiratory system: Secondary | ICD-10-CM | POA: Diagnosis not present

## 2023-09-19 DIAGNOSIS — Z8673 Personal history of transient ischemic attack (TIA), and cerebral infarction without residual deficits: Secondary | ICD-10-CM

## 2023-09-19 DIAGNOSIS — R059 Cough, unspecified: Secondary | ICD-10-CM | POA: Diagnosis not present

## 2023-09-19 LAB — RESP PANEL BY RT-PCR (RSV, FLU A&B, COVID)  RVPGX2
Influenza A by PCR: NEGATIVE
Influenza B by PCR: NEGATIVE
Resp Syncytial Virus by PCR: NEGATIVE
SARS Coronavirus 2 by RT PCR: NEGATIVE

## 2023-09-19 LAB — COMPREHENSIVE METABOLIC PANEL
ALT: 37 U/L (ref 0–44)
AST: 63 U/L — ABNORMAL HIGH (ref 15–41)
Albumin: 2.5 g/dL — ABNORMAL LOW (ref 3.5–5.0)
Alkaline Phosphatase: 99 U/L (ref 38–126)
Anion gap: 9 (ref 5–15)
BUN: 20 mg/dL (ref 8–23)
CO2: 21 mmol/L — ABNORMAL LOW (ref 22–32)
Calcium: 8.6 mg/dL — ABNORMAL LOW (ref 8.9–10.3)
Chloride: 102 mmol/L (ref 98–111)
Creatinine, Ser: 1.11 mg/dL (ref 0.61–1.24)
GFR, Estimated: >60 mL/min (ref >60)
Glucose, Bld: 119 mg/dL — ABNORMAL HIGH (ref 70–99)
Potassium: 4.4 mmol/L (ref 3.5–5.1)
Sodium: 132 mmol/L — ABNORMAL LOW (ref 135–145)
Total Bilirubin: 5.8 mg/dL — ABNORMAL HIGH (ref 0.0–1.2)
Total Protein: 7.1 g/dL (ref 6.5–8.1)

## 2023-09-19 LAB — URINALYSIS, ROUTINE W REFLEX MICROSCOPIC
Bacteria, UA: NONE SEEN
Bilirubin Urine: NEGATIVE
Glucose, UA: NEGATIVE mg/dL
Hgb urine dipstick: NEGATIVE
Ketones, ur: NEGATIVE mg/dL
Leukocytes,Ua: NEGATIVE
Nitrite: NEGATIVE
Protein, ur: 30 mg/dL — AB
Specific Gravity, Urine: 1.023 (ref 1.005–1.030)
pH: 5 (ref 5.0–8.0)

## 2023-09-19 LAB — CBC WITH DIFFERENTIAL/PLATELET
Abs Immature Granulocytes: 0.08 10*3/uL — ABNORMAL HIGH (ref 0.00–0.07)
Basophils Absolute: 0.1 10*3/uL (ref 0.0–0.1)
Basophils Relative: 0 %
Eosinophils Absolute: 0.1 10*3/uL (ref 0.0–0.5)
Eosinophils Relative: 0 %
HCT: 33.6 % — ABNORMAL LOW (ref 39.0–52.0)
Hemoglobin: 11 g/dL — ABNORMAL LOW (ref 13.0–17.0)
Immature Granulocytes: 1 %
Lymphocytes Relative: 3 %
Lymphs Abs: 0.4 10*3/uL — ABNORMAL LOW (ref 0.7–4.0)
MCH: 36.4 pg — ABNORMAL HIGH (ref 26.0–34.0)
MCHC: 32.7 g/dL (ref 30.0–36.0)
MCV: 111.3 fL — ABNORMAL HIGH (ref 80.0–100.0)
Monocytes Absolute: 0.7 10*3/uL (ref 0.1–1.0)
Monocytes Relative: 6 %
Neutro Abs: 10.4 10*3/uL — ABNORMAL HIGH (ref 1.7–7.7)
Neutrophils Relative %: 90 %
Platelets: 60 10*3/uL — ABNORMAL LOW (ref 150–400)
RBC: 3.02 MIL/uL — ABNORMAL LOW (ref 4.22–5.81)
RDW: 15.2 % (ref 11.5–15.5)
WBC: 11.6 10*3/uL — ABNORMAL HIGH (ref 4.0–10.5)
nRBC: 0 % (ref 0.0–0.2)

## 2023-09-19 LAB — PROTIME-INR
INR: 2.5 — ABNORMAL HIGH (ref 0.8–1.2)
Prothrombin Time: 27.1 s — ABNORMAL HIGH (ref 11.4–15.2)

## 2023-09-19 LAB — LACTIC ACID, PLASMA
Lactic Acid, Venous: 3.1 mmol/L (ref 0.5–1.9)
Lactic Acid, Venous: 3.4 mmol/L (ref 0.5–1.9)

## 2023-09-19 LAB — APTT: aPTT: 40 s — ABNORMAL HIGH (ref 24–36)

## 2023-09-19 MED ORDER — METRONIDAZOLE 500 MG/100ML IV SOLN
500.0000 mg | Freq: Once | INTRAVENOUS | Status: AC
  Administered 2023-09-19: 500 mg via INTRAVENOUS
  Filled 2023-09-19: qty 100

## 2023-09-19 MED ORDER — ONDANSETRON HCL 4 MG/2ML IJ SOLN
4.0000 mg | Freq: Once | INTRAMUSCULAR | Status: AC
  Administered 2023-09-19: 4 mg via INTRAVENOUS
  Filled 2023-09-19: qty 2

## 2023-09-19 MED ORDER — LACTATED RINGERS IV SOLN: INTRAVENOUS | Status: AC

## 2023-09-19 MED ORDER — LACTATED RINGERS IV BOLUS
1000.0000 mL | Freq: Once | INTRAVENOUS | Status: AC
  Administered 2023-09-19: 1000 mL via INTRAVENOUS

## 2023-09-19 MED ORDER — SODIUM CHLORIDE 0.9 % IV SOLN
2.0000 g | Freq: Once | INTRAVENOUS | Status: AC
  Administered 2023-09-19: 2 g via INTRAVENOUS
  Filled 2023-09-19: qty 12.5

## 2023-09-19 MED ORDER — IOHEXOL 300 MG/ML  SOLN
100.0000 mL | Freq: Once | INTRAMUSCULAR | Status: AC | PRN
  Administered 2023-09-19: 100 mL via INTRAVENOUS

## 2023-09-19 MED ORDER — VANCOMYCIN HCL IN DEXTROSE 1-5 GM/200ML-% IV SOLN
1000.0000 mg | Freq: Once | INTRAVENOUS | Status: AC
  Administered 2023-09-19: 1000 mg via INTRAVENOUS
  Filled 2023-09-19: qty 200

## 2023-09-19 NOTE — H&P (Addendum)
History and Physical    Patient: Jose Lawrence:[id number] DOB: Aug 17, 1940 DOA: 09/19/2023 DOS: the patient was seen and examined on 09/20/2023 PCP: Ignatius Specking, MD  Patient coming from: Home  Chief Complaint:  Chief Complaint  Patient presents with   Influenza   HPI: Jose Lawrence is a 83 y.o. 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  He complained of fever of 102.40F yesterday. Patient was admitted from 06/08/2023 to 06/10/2023 due to sepsis due to bacteremia with isolation of Streptococcus agalactiae from blood culture and patient was treated with IV Rocephin initially and discharged on amoxicillin 1 g 3 times daily for additional 8 days to complete 10 days of therapy.  He also had ESBL UTI with hematuria at that time.  ED Course:  In the emergency department, BP was 171/70, other vital signs are within normal range.  Workup in the ED showed WBC 11.6, hemoglobin 9.0, hematocrit 33.6, MCV 111.3.  BMP was normal except for sodium of 132, bicarb 21, blood glucose 119, albumin 2.5, AST 63, ALT 37, ALP 99, total bilirubin 5.8.  Lactic acid 3.1 > 3.4, urinalysis was normal.  Influenza A, B, SARS-CoV-2, RSV was negative. CT chest, abdomen and pelvis with contrast showed interval increase in size of a moderate right and small left pleural effusion. Almost complete collapse of the right lower lobe.  Cirrhosis with portal hypertension. No focal liver lesions identified. Chest x-ray showed developing pleural effusions with adjacent opacity, right greater than left. Patient was empirically started on IV cefepime, metronidazole and vancomycin.  Zofran was given and IV hydration per sepsis protocol was  provided Hospitalist was asked to admit patient for further evaluation and management.   Review of Systems: Review of systems as noted in the HPI. All other systems reviewed and are negative.   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 Lita Mains   PE (pulmonary embolism)    Temporarily on Eliquis 2017   Skin cancer    35 years ago   Type 2 diabetes mellitus (HCC)    Vitamin B 12 deficiency 07/10/2019   Past Surgical History:  Procedure Laterality Date   ABDOMINOPERINEAL PROCTOCOLECTOMY  2009   end colostomy    BIOPSY  08/25/2018   Procedure: BIOPSY;  Surgeon: Malissa Hippo, MD;  [facility]: [facility];  Service: Endoscopy;;   BIOPSY  10/04/2021   Procedure: BIOPSY;  Surgeon: Malissa Hippo, MD;  [facility]: [facility];  Service: Endoscopy;;  small, bowel   BLADDER REPAIR  08/29/2018   Procedure: BLADDER REPAIR;  Surgeon: Lucretia Roers, MD;  Location: [facility];  Service: General;;   COLECTOMY  2008   COLONOSCOPY WITH PROPOFOL N/A 08/25/2018   Procedure: COLONOSCOPY WITH PROPOFOL;  Surgeon: Malissa Hippo, MD;  Location: [facility];  Service: Endoscopy;  Laterality: N/A;  2:20   COLOSTOMY     ESOPHAGOGASTRODUODENOSCOPY (EGD) WITH PROPOFOL N/A 05/10/2023  Procedure: ESOPHAGOGASTRODUODENOSCOPY (EGD) WITH PROPOFOL;  Surgeon: Dolores Frame, MD;  Location: [facility];  Service: Gastroenterology;  Laterality: N/A;  11:00AM;ASA 3   EXPLORATORY LAPAROTOMY  2017   ? pneumoperitoneum of unknown etiology/ negative Ex lap   GIVENS CAPSULE STUDY  10/04/2021   Procedure: GIVENS CAPSULE STUDY;  Surgeon: Malissa Hippo, MD;  [facility]: [facility];  Service: Endoscopy;;   ILEOSCOPY N/A 10/04/2021    Procedure: ILEOSCOPY THROUGH STOMA;  Surgeon: Malissa Hippo, MD;  [facility]: [facility];  Service: Endoscopy;  Laterality: N/A;   ILEOSTOMY  08/29/2018   Procedure: ILEOSTOMY;  Surgeon: Lucretia Roers, MD;  Location: [facility];  Service: General;;   LAPAROSCOPIC CHOLECYSTECTOMY  2007   LYSIS OF ADHESION  08/29/2018   Procedure: LYSIS OF ADHESION;  Surgeon: Lucretia Roers, MD;  Location: [facility];  Service: General;;   PARTIAL COLECTOMY N/A 08/29/2018   Procedure: PARTIAL COLECTOMY;  Surgeon: Lucretia Roers, MD;  Location: [facility];  Service: General;  Laterality: N/A;   PORTACATH PLACEMENT Left 10/04/2018   Procedure: INSERTION PORT-A-CATH (attached catheter left subclavian);  Surgeon: Lucretia Roers, MD;  Location: [facility];  Service: General;  Laterality: Left;   SBO SURGERY  06/2009    Social History:  reports that he quit smoking about 32 years ago. His smoking use included cigarettes. He started smoking about 59 years ago. He has a 27 pack-year smoking history. He quit smokeless tobacco use about 28 years ago.  His smokeless tobacco use included chew. He reports that he does not drink alcohol and does not use drugs.   No Known Allergies  Family History  Problem Relation Age of Onset   Heart disease Father    Heart failure Father    Heart attack Father    Prostate cancer Paternal Uncle      Prior to Admission medications   Medication Sig Start Date End Date Taking? Authorizing Provider  carvedilol (COREG) 3.125 MG tablet Take 1 tablet (3.125 mg total) by mouth 2 (two) times daily with a meal. 05/10/23   Marguerita Merles, Reuel Boom, MD  furosemide (LASIX) 40 MG tablet Take 20-40 mg by mouth daily as needed.    [provider]  glipiZIDE (GLUCOTROL) 5 MG tablet Take 10 mg by mouth every morning. 05/04/23   [provider]  lactose free nutrition (BOOST) LIQD Take 237 mLs by mouth daily at 6 (six) AM.    [provider]  levothyroxine (SYNTHROID,  LEVOTHROID) 50 MCG tablet Take 50 mcg by mouth daily before breakfast.    [provider]  loperamide (IMODIUM) 2 MG capsule Take 2 capsules (4 mg total) by mouth 4 (four) times daily -  before meals and at bedtime. 11/11/18   Lucretia Roers, MD  [facility] VERIO test strip 1 each 2 (two) times daily. 01/21/22   [provider]  potassium chloride (KLOR-CON) 10 MEQ tablet Take 1 tablet (10 mEq total) by mouth daily as needed. Take when you take Lasix/Furosemide 06/10/23   Shon Hale, MD  tamsulosin (FLOMAX) 0.4 MG CAPS capsule Take 1 capsule (0.4 mg total) by mouth daily after supper. 06/10/23   Shon Hale, MD  Vitamin D, Ergocalciferol, (DRISDOL) 1.25 MG (50000 UNIT) CAPS capsule Take 1 capsule by mouth once a week 09/05/23   Doreatha Massed, MD    Physical Exam: BP (!) 126/57 (BP Location: Left Arm)   Pulse 69   Temp 98.1 F (36.7 C) (Oral)  Resp 14   Ht 6' (1.829 m)   Wt 89 kg   SpO2 92%   BMI 26.61 kg/m   General: 83 y.o. year-old male ill appearing, but in no acute distress.  Alert and oriented x3. HEENT: NCAT, EOMI, scleral icterus Neck: Supple, trachea medial Cardiovascular: Regular rate and rhythm with no rubs or gallops.  No thyromegaly or JVD noted.  No lower extremity edema. 2/4 pulses in all 4 extremities. Respiratory: Clear to auscultation with no wheezes or rales. Good inspiratory effort. Abdomen: Colostomy bag noted. Soft, nontender nondistended with normal bowel sounds x4 quadrants. Muskuloskeletal: No cyanosis, clubbing or edema noted bilaterally Neuro: CN II-XII intact, strength 5/5 x 4, sensation, reflexes intact Skin: Jaundiced.  No ulcerative lesions noted or rashes Psychiatry: Judgement and insight appear normal. Mood is appropriate for condition and setting          Labs on Admission:  Basic Metabolic Panel: Recent Labs  Lab 09/19/23 1547  NA 132*  K 4.4  CL 102  CO2 21*  GLUCOSE 119*  BUN 20  CREATININE 1.11  CALCIUM  8.6*   Liver Function Tests: Recent Labs  Lab 09/19/23 1547  AST 63*  ALT 37  ALKPHOS 99  BILITOT 5.8*  PROT 7.1  ALBUMIN 2.5*   No results for input(s): "LIPASE", "AMYLASE" in the last 168 hours. No results for input(s): "AMMONIA" in the last 168 hours. CBC: Recent Labs  Lab 09/19/23 1547  WBC 11.6*  NEUTROABS 10.4*  HGB 11.0*  HCT 33.6*  MCV 111.3*  PLT 60*   Cardiac Enzymes: No results for input(s): "CKTOTAL", "CKMB", "CKMBINDEX", "TROPONINI" in the last 168 hours.  BNP (last 3 results) No results for input(s): "BNP" in the last 8760 hours.  ProBNP (last 3 results) No results for input(s): "PROBNP" in the last 8760 hours.  CBG: No results for input(s): "GLUCAP" in the last 168 hours.  Radiological Exams on Admission: CT CHEST ABDOMEN PELVIS W CONTRAST Result Date: 09/19/2023 CLINICAL DATA:  Sepsis. c/o cough, congestion, fever and flu-like symptoms since Saturday EXAM: CT CHEST, ABDOMEN, AND PELVIS WITH CONTRAST TECHNIQUE: Multidetector CT imaging of the chest, abdomen and pelvis was performed following the standard protocol during bolus administration of intravenous contrast. RADIATION DOSE REDUCTION: This exam was performed according to the departmental dose-optimization program which includes automated exposure control, adjustment of the mA and/or kV according to patient size and/or use of iterative reconstruction technique. CONTRAST:  OMNIPAQUE IOHEXOL 300 MG/ML  SOLN COMPARISON:  Chest x-ray 09/19/2023, CT abdomen pelvis 04/22/2023, CT abdomen pelvis 04/04/2023, CT abdomen pelvis 10/03/2021 FINDINGS: CT CHEST FINDINGS Cardiovascular: Mildly enlarged right heart size. No significant pericardial effusion. The thoracic aorta is normal in caliber. Mild atherosclerotic plaque of the thoracic aorta. Four-vessel coronary artery calcifications. Aortic valve leaflet calcification. Mediastinum/Nodes: No enlarged mediastinal, hilar, or axillary lymph nodes. Thyroid gland,  trachea, and esophagus demonstrate no significant findings. Lungs/Pleura: Bilateral lower lobe passive atelectasis. Almost complete collapse of the right lower lobe. No focal consolidation. No pulmonary nodule. No pulmonary mass. Interval increase in size of a moderate right and small left pleural effusion. No pneumothorax. Musculoskeletal: No chest wall abnormality.  Bilateral gynecomastia. No suspicious lytic or blastic osseous lesions. No acute displaced fracture. Multilevel degenerative changes of the spine. CT ABDOMEN PELVIS FINDINGS Hepatobiliary: Nodular hepatic contour. No focal liver abnormality. Status post cholecystectomy. No biliary dilatation. Pancreas: Diffusely atrophic. No focal lesion. Otherwise normal pancreatic contour. No surrounding inflammatory changes. No main pancreatic ductal dilatation. Spleen: Normal  in size without focal abnormality. Adrenals/Urinary Tract: No adrenal nodule bilaterally. Bilateral kidneys enhance symmetrically. Question mild left urothelial thickening. No hydronephrosis. No hydroureter.  No nephroureterolithiasis. The urinary bladder is unremarkable. On delayed imaging, there is no urothelial wall thickening and there are no filling defects in the opacified portions of the bilateral collecting systems or ureters. 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 Rectal surgical changes with Redemonstration of extensive presacral mesenteric fat stranding and soft tissue density. Similar orientation of several loops of the small bowel within the left anterior abdomen suggestive of possible underlying adhesions. No associated obstruction. Stomach is within normal limits. Majority of the small bowel is decompressed. No evidence of bowel wall thickening or dilatation. No pneumatosis. Appendix appears normal. Vascular/Lymphatic: Recanalized paraumbilical vein. Paraesophageal varices. The portal, splenic, superior mesenteric veins are patent. No abdominal aorta  or iliac aneurysm. Mild atherosclerotic plaque of the aorta and its branches. No abdominal, pelvic, or inguinal lymphadenopathy. Reproductive: Prostate is unremarkable. Other: Grossly stable small volume simple free fluid ascites. Associated diffuse mild mesenteric edema. No intraperitoneal free gas. No organized fluid collection. Musculoskeletal: No abdominal wall hernia or abnormality. No suspicious lytic or blastic osseous lesions. No acute displaced fracture. Multilevel degenerative changes of the spine. IMPRESSION: 1. Interval increase in size of a moderate right and small left pleural effusion. Almost complete collapse of the right lower lobe. 2. Mild right cardiomegaly. 3. Question mild left urothelial thickening. Correlate with urinalysis for infection. 4. Cirrhosis with portal hypertension. No focal liver lesions identified. Please note that liver protocol enhanced MR and CT are the most sensitive tests for the screening detection of hepatocellular carcinoma in the high risk setting of cirrhosis. 5. Grossly stable small volume simple free fluid ascites. 6. 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 Rectal surgical changes with Redemonstration of extensive presacral mesenteric fat stranding and soft tissue density. Markedly limited evaluation of this region. 7. Aortic Atherosclerosis (ICD10-I70.0) including four-vessel coronary artery and aortic valve leaflet calcifications-correlate for aortic stenosis. Electronically Signed   By: Tish Frederickson M.D.   On: 09/19/2023 19:39   DG Chest 2 View Result Date: 09/19/2023 CLINICAL DATA:  Cough EXAM: CHEST - 2 VIEW COMPARISON:  06/08/2023 FINDINGS: Underinflation. Enlarged cardiopericardial silhouette. Central vascular prominence. Developing bilateral small pleural effusion with adjacent opacity, right-greater-than-left. Recommend follow-up. No pneumothorax. Left upper chest subclavian port. Tip overlying the central SVC. Degenerative changes of the  spine. IMPRESSION: Developing pleural effusions with adjacent opacity, right-greater-than-left. Recommend follow-up. Persistent enlarged cardiac silhouette with vascular congestion. Chest port Electronically Signed   By: Karen Kays M.D.   On: 09/19/2023 17:39    EKG: I independently viewed the EKG done and my findings are as followed: EKG was not done in the ED  Assessment/Plan Present on Admission:  Acute febrile illness  Lactic acidosis  Macrocytic anemia  Thrombocytopenia (HCC)  Acquired hypothyroidism  Cancer of transverse colon (HCC)  Atrial fibrillation, chronic (HCC)  Principal Problem:   Acute febrile illness Active Problems:   Atrial fibrillation, chronic (HCC)   Acquired hypothyroidism   Cancer of transverse colon (HCC)   Macrocytic anemia   Thrombocytopenia (HCC)   Lactic acidosis   Vomiting   Pleural effusion   Hypoalbuminemia due to protein-calorie malnutrition (HCC)  Acute febrile illness Patient was reported to have fever of 102.59F at home yesterday (2/9), patient is currently without fever and no current source of patient's illness other than possible upper respiratory tract infection, but influenza A, B, SARS 2, RSV  was negative. Other respiratory panel will be checked Continue empiric antibiotics (vancomycin and cefepime) with plan to de-escalate/discontinue based on blood culture and procalcitonin Continue Tylenol, Robitussin as needed Continue supportive treatment  Lactic acidosis possibly secondary to above Lactic acid 3.1 > 3.4, continue to trend lactic acid  Vomiting Continue Zofran as needed  Pleural effusion CT chest showed moderate right and small left pleural effusion.  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 Ellin Saba   Atrial fibrillation, chronic Continue Coreg for rate control, patient not on anticoagulation possibly because of persistent thrombocytopenia and anemia due to cirrhosis   Controlled type 2 diabetes mellitus without complication  Diet controlled at this time   DVT prophylaxis: SCDs  Family Communication: Wife at bedside (all questions answered to satisfaction)   Advance Care Planning:   Code Status: Full Code   Consults: None  Severity of Illness: The appropriate patient status for this patient is INPATIENT. Inpatient status is judged to be reasonable and necessary in order to provide the required intensity of service to ensure the patient's safety. The patient's presenting symptoms, physical exam findings, and initial radiographic and laboratory data in the context of their chronic comorbidities is felt to place them at high risk for further clinical deterioration. Furthermore, it is not anticipated that the patient will be medically stable for discharge from the hospital within 2 midnights of admission.   * I certify that at the point of admission it is my clinical judgment that the patient will require inpatient hospital care spanning beyond 2 midnights from the point of admission due to high intensity of service, high risk for further deterioration and high frequency of surveillance required.*  Author: Frankey Shown, DO 09/20/2023 2:31 AM  For on call review [email or web address].

## 2023-09-19 NOTE — ED Provider Notes (Signed)
[facility] EMERGENCY DEPARTMENT AT [facility] Provider Note   CSN: [id number] Arrival date & time: 09/19/23  1347     History  Chief Complaint  Patient presents with   Influenza    Jose Lawrence is a 83 y.o. male with PMHx of colon cancer, colostomy, BPH, IDA, hypothyroidism, cirrhosis, sepsis, HTN, T2DM, CKD III, a fib presents to ED for evaluation of coughing, sneezing, vomiting, chills since Saturday. He reports that he vomits 4x a day and when drinking liquids. Fever 102.57F yesterday  Of note, he was admitted for bacteremia d/t GBS on 06/08/23 with similar presentation of normal VS from UTI with reported fever at home   Influenza Presenting symptoms: cough and vomiting   Presenting symptoms: no diarrhea, no fatigue, no fever, no headaches, no nausea and no shortness of breath   Associated symptoms: no chills       Home Medications Prior to Admission medications   Medication Sig Start Date End Date Taking? Authorizing Provider  carvedilol (COREG) 3.125 MG tablet Take 1 tablet (3.125 mg total) by mouth 2 (two) times daily with a meal. 05/10/23   Marguerita Merles, Reuel Boom, MD  furosemide (LASIX) 40 MG tablet Take 20-40 mg by mouth daily as needed.    [provider]  glipiZIDE (GLUCOTROL) 5 MG tablet Take 10 mg by mouth every morning. 05/04/23   [provider]  lactose free nutrition (BOOST) LIQD Take 237 mLs by mouth daily at 6 (six) AM.    [provider]  levothyroxine (SYNTHROID, LEVOTHROID) 50 MCG tablet Take 50 mcg by mouth daily before breakfast.    [provider]  loperamide (IMODIUM) 2 MG capsule Take 2 capsules (4 mg total) by mouth 4 (four) times daily -  before meals and at bedtime. 11/11/18   Lucretia Roers, MD  [facility] VERIO test strip 1 each 2 (two) times daily. 01/21/22   [provider]  potassium chloride (KLOR-CON) 10 MEQ tablet Take 1 tablet (10 mEq total) by mouth daily as needed. Take when you  take Lasix/Furosemide 06/10/23   Shon Hale, MD  tamsulosin (FLOMAX) 0.4 MG CAPS capsule Take 1 capsule (0.4 mg total) by mouth daily after supper. 06/10/23   Shon Hale, MD  Vitamin D, Ergocalciferol, (DRISDOL) 1.25 MG (50000 UNIT) CAPS capsule Take 1 capsule by mouth once a week 09/05/23   Doreatha Massed, MD      Allergies    Patient has no known allergies.    Review of Systems   Review of Systems  Constitutional:  Negative for chills, fatigue and fever.  HENT:  Negative for trouble swallowing and voice change.   Respiratory:  Positive for cough. Negative for chest tightness, shortness of breath and wheezing.   Cardiovascular:  Negative for chest pain and palpitations.  Gastrointestinal:  Positive for vomiting. Negative for abdominal pain, constipation, diarrhea and nausea.  Neurological:  Negative for dizziness, seizures, weakness, light-headedness, numbness and headaches.    Physical Exam Updated Vital Signs BP (!) 174/70   Pulse 90   Temp 99.2 F (37.3 C) (Oral)   Resp 19   Ht 6' (1.829 m)   Wt 89 kg   SpO2 98%   BMI 26.61 kg/m  Physical Exam Vitals and nursing note reviewed.  Constitutional:      General: He is not in acute distress.    Appearance: Normal appearance. He is ill-appearing.  HENT:     Head: Normocephalic and atraumatic.     Right Ear:  Tympanic membrane, ear canal and external ear normal.     Left Ear: Tympanic membrane, ear canal and external ear normal.     Nose: Nose normal.     Mouth/Throat:     Mouth: Mucous membranes are moist.     Pharynx: No oropharyngeal exudate or posterior oropharyngeal erythema.  Eyes:     General: Scleral icterus present.        Right eye: No discharge.        Left eye: No discharge.     Extraocular Movements: Extraocular movements intact.     Pupils: Pupils are equal, round, and reactive to light.  Cardiovascular:     Rate and Rhythm: Normal rate.  Pulmonary:     Effort: Pulmonary effort is normal. No  respiratory distress.     Breath sounds: Normal breath sounds.  Abdominal:     General: Bowel sounds are normal. There is no distension.     Palpations: Abdomen is soft.     Tenderness: There is no abdominal tenderness.     Comments: Stoma with colostomy bag. Not infectious appearing without blood  Musculoskeletal:     Cervical back: Normal range of motion and neck supple. No rigidity.     Right lower leg: No edema.     Left lower leg: No edema.  Skin:    General: Skin is warm.     Capillary Refill: Capillary refill takes less than 2 seconds.     Coloration: Skin is jaundiced. Skin is not pale.  Neurological:     General: No focal deficit present.     Mental Status: He is alert and oriented to person, place, and time. Mental status is at baseline.     ED Results / Procedures / Treatments   Labs (all labs ordered are listed, but only abnormal results are displayed) Labs Reviewed  RESP PANEL BY RT-PCR (RSV, FLU A&B, COVID)  [id number]  CBC WITH DIFFERENTIAL/PLATELET  COMPREHENSIVE METABOLIC PANEL    EKG None  Radiology No results found.  Procedures Procedures    Medications Ordered in ED Medications - No data to display  ED Course/ Medical Decision Making/ A&P                                 Medical Decision Making Amount and/or Complexity of Data Reviewed Labs: ordered. Radiology: ordered.  Risk Prescription drug management.   Patient presents to the ED for concern of respiratory symptoms, fever, intractable vomiting, this involves an extensive number of treatment options, and is a complaint that carries with it a high risk of complications and morbidity.  The differential diagnosis includes URI, PNA, UTI   Co morbidities that complicate the patient evaluation  Pmhx of sepsis cirrhosis   Additional history obtained:  Additional history obtained from Nursing and Outside Medical Records   External records from outside source obtained and reviewed  including Triage RN note ED admissions from October for sepsis and bacteremia   Lab Tests:  I Ordered, and personally interpreted labs.  The pertinent results include:   Leukocytosis of 11.6 Hemoglobin 11 (baseline 9-12.8 over past 10 months) Odium 132 CBG 119 Calcium 8.6 Albumin 2.5 AST 63 Total bilirubin 5.8 PT 27.1 INR 2.5 Initial lactate 3.1 and second lactic acid is 3.4 Cultures pending UA wo infection   Imaging Studies ordered:  I ordered imaging studies including CT chest abd pelvis  I independently visualized and interpreted imaging  which showed  Moderate right and small left pleural effusions Cirrhosis with portal HTN Stable ascites I agree with the radiologist interpretation    Medicines ordered and prescription drug management:  Dr. Heywood Bene ordered medication including cefepime, vanc, metro  for infection prophylaxis  I ordered 2L LR for possible sepsis Reevaluation of the patient after these medicines showed that the patient stayed the same I have reviewed the patients home medicines and have made adjustments as needed    Consultations Obtained:  I requested consultation with the hospitalist,  and discussed lab and imaging findings as well as pertinent plan - Dr. Thomes Dinning accepts patient for admission   Problem List / ED Course:  Cough Resp panel neg No PNA on CT Fever of unknown origin CT abd pelvis significant for effusion without source of infection UA neg for infection Due to history of bacteremia, will obtain [facility] and treat with broad spectrums Elevated lactic acid Pleural effusion Worsening with almost complete collapse of RL No hypoxia, complaints of SOB Likely d/t cirrhosis. Stable small ascites Elevated bili Increased from 3.9 to 5.8 likely d/t cirrhosis   Reevaluation:  After the interventions noted above, I reevaluated the patient and found that they have :stayed the same    Dispostion:  After consideration of the diagnostic  results and the patients response to treatment, I feel that the patent would benefit from admission to r/o sepsis, worsening pleural effusion which may require thoracentesis.   Discussed patient with Dr. Durwin Nora who reviewed w/u and agrees with plan Final Clinical Impression(s) / ED Diagnoses Final diagnoses:  None    Rx / DC Orders ED Discharge Orders     None         Judithann Sheen, PA 09/19/23 2357    Gloris Manchester, MD 09/20/23 [phone]

## 2023-09-19 NOTE — ED Triage Notes (Signed)
 Pt arrived via POV from home c/o cough, congestion, fever and flu-like symptoms since Saturday.

## 2023-09-19 NOTE — Sepsis Progress Note (Signed)
 Following for sepsis monitoring ?

## 2023-09-19 NOTE — Consult Note (Signed)
 CODE SEPSIS - PHARMACY COMMUNICATION  **Broad Spectrum Antibiotics should be administered within 1 hour of Sepsis diagnosis**  Time Code Sepsis Called/Page Received: 2140  Antibiotics Ordered: cefepime , vancomycin , and metronidazole   Time of 1st antibiotic administration: 2202  Additional action taken by pharmacy: n/a  If necessary, Name of Provider/Nurse Contacted: n/a    Drishti Pepperman A Areta Terwilliger, PharmD Clinical Pharmacist 09/19/2023 10:14 PM

## 2023-09-20 DIAGNOSIS — J90 Pleural effusion, not elsewhere classified: Secondary | ICD-10-CM | POA: Insufficient documentation

## 2023-09-20 DIAGNOSIS — R7881 Bacteremia: Secondary | ICD-10-CM

## 2023-09-20 DIAGNOSIS — R1110 Vomiting, unspecified: Secondary | ICD-10-CM | POA: Insufficient documentation

## 2023-09-20 DIAGNOSIS — E46 Unspecified protein-calorie malnutrition: Secondary | ICD-10-CM | POA: Insufficient documentation

## 2023-09-20 LAB — BLOOD CULTURE ID PANEL (REFLEXED) - BCID2
A.calcoaceticus-baumannii: NOT DETECTED
Bacteroides fragilis: NOT DETECTED
Candida albicans: NOT DETECTED
Candida auris: NOT DETECTED
Candida glabrata: NOT DETECTED
Candida krusei: NOT DETECTED
Candida parapsilosis: NOT DETECTED
Candida tropicalis: NOT DETECTED
Cryptococcus neoformans/gattii: NOT DETECTED
Enterobacter cloacae complex: NOT DETECTED
Enterobacterales: NOT DETECTED
Enterococcus Faecium: NOT DETECTED
Enterococcus faecalis: NOT DETECTED
Escherichia coli: NOT DETECTED
Haemophilus influenzae: NOT DETECTED
Klebsiella aerogenes: NOT DETECTED
Klebsiella oxytoca: NOT DETECTED
Klebsiella pneumoniae: NOT DETECTED
Listeria monocytogenes: NOT DETECTED
Methicillin resistance mecA/C: DETECTED — AB
Neisseria meningitidis: NOT DETECTED
Proteus species: NOT DETECTED
Pseudomonas aeruginosa: NOT DETECTED
Salmonella species: NOT DETECTED
Serratia marcescens: NOT DETECTED
Staphylococcus aureus (BCID): NOT DETECTED
Staphylococcus epidermidis: NOT DETECTED
Staphylococcus lugdunensis: DETECTED — AB
Staphylococcus species: DETECTED — AB
Stenotrophomonas maltophilia: NOT DETECTED
Streptococcus agalactiae: NOT DETECTED
Streptococcus pneumoniae: NOT DETECTED
Streptococcus pyogenes: NOT DETECTED
Streptococcus species: NOT DETECTED

## 2023-09-20 LAB — CBC
HCT: 28 % — ABNORMAL LOW (ref 39.0–52.0)
Hemoglobin: 9.4 g/dL — ABNORMAL LOW (ref 13.0–17.0)
MCH: 37.2 pg — ABNORMAL HIGH (ref 26.0–34.0)
MCHC: 33.6 g/dL (ref 30.0–36.0)
MCV: 110.7 fL — ABNORMAL HIGH (ref 80.0–100.0)
Platelets: 58 10*3/uL — ABNORMAL LOW (ref 150–400)
RBC: 2.53 MIL/uL — ABNORMAL LOW (ref 4.22–5.81)
RDW: 15.4 % (ref 11.5–15.5)
WBC: 10.8 10*3/uL — ABNORMAL HIGH (ref 4.0–10.5)
nRBC: 0 % (ref 0.0–0.2)

## 2023-09-20 LAB — PHOSPHORUS: Phosphorus: 3 mg/dL (ref 2.5–4.6)

## 2023-09-20 LAB — RESPIRATORY PANEL BY PCR

## 2023-09-20 LAB — COMPREHENSIVE METABOLIC PANEL
ALT: 33 U/L (ref 0–44)
AST: 57 U/L — ABNORMAL HIGH (ref 15–41)
Albumin: 2.1 g/dL — ABNORMAL LOW (ref 3.5–5.0)
Alkaline Phosphatase: 83 U/L (ref 38–126)
Anion gap: 6 (ref 5–15)
BUN: 24 mg/dL — ABNORMAL HIGH (ref 8–23)
CO2: 21 mmol/L — ABNORMAL LOW (ref 22–32)
Calcium: 7.9 mg/dL — ABNORMAL LOW (ref 8.9–10.3)
Chloride: 102 mmol/L (ref 98–111)
Creatinine, Ser: 1.19 mg/dL (ref 0.61–1.24)
GFR, Estimated: >60 mL/min (ref >60)
Glucose, Bld: 137 mg/dL — ABNORMAL HIGH (ref 70–99)
Potassium: 4.6 mmol/L (ref 3.5–5.1)
Sodium: 129 mmol/L — ABNORMAL LOW (ref 135–145)
Total Bilirubin: 4 mg/dL — ABNORMAL HIGH (ref 0.0–1.2)
Total Protein: 6.1 g/dL — ABNORMAL LOW (ref 6.5–8.1)

## 2023-09-20 LAB — VITAMIN B12: Vitamin B-12: 497 pg/mL (ref 180–914)

## 2023-09-20 LAB — FOLATE: Folate: 12.2 ng/mL (ref >5.9)

## 2023-09-20 LAB — PROCALCITONIN: Procalcitonin: 7.24 ng/mL

## 2023-09-20 LAB — LACTIC ACID, PLASMA: Lactic Acid, Venous: 3.2 mmol/L (ref 0.5–1.9)

## 2023-09-20 LAB — MAGNESIUM: Magnesium: 1.7 mg/dL (ref 1.7–2.4)

## 2023-09-20 MED ORDER — SODIUM CHLORIDE 0.9 % IV SOLN
1.0000 g | Freq: Three times a day (TID) | INTRAVENOUS | Status: DC
  Administered 2023-09-20 – 2023-09-21 (×3): 1 g via INTRAVENOUS
  Filled 2023-09-20 (×3): qty 20

## 2023-09-20 MED ORDER — VANCOMYCIN HCL 1500 MG/300ML IV SOLN
1500.0000 mg | INTRAVENOUS | Status: DC
  Administered 2023-09-20 – 2023-09-22 (×3): 1500 mg via INTRAVENOUS
  Filled 2023-09-20 (×3): qty 300

## 2023-09-20 MED ORDER — SODIUM CHLORIDE 0.9 % IV SOLN: 2.0000 g | Freq: Two times a day (BID) | INTRAVENOUS | Status: DC

## 2023-09-20 MED ORDER — ONDANSETRON HCL 4 MG/2ML IJ SOLN
4.0000 mg | Freq: Four times a day (QID) | INTRAMUSCULAR | Status: DC | PRN
  Administered 2023-09-23: 4 mg via INTRAVENOUS
  Filled 2023-09-20: qty 2

## 2023-09-20 MED ORDER — ONDANSETRON HCL 4 MG PO TABS
4.0000 mg | ORAL_TABLET | Freq: Four times a day (QID) | ORAL | Status: DC | PRN
Start: 2023-09-20 — End: 2023-09-26

## 2023-09-20 MED ORDER — GUAIFENESIN-DM 100-10 MG/5ML PO SYRP: 5.0000 mL | ORAL_SOLUTION | ORAL | Status: DC | PRN

## 2023-09-20 MED ORDER — GLUCERNA SHAKE PO LIQD
237.0000 mL | Freq: Three times a day (TID) | ORAL | Status: DC
  Administered 2023-09-20 – 2023-09-26 (×15): 237 mL via ORAL
  Filled 2023-09-20 (×8): qty 237

## 2023-09-20 MED ORDER — LEVOTHYROXINE SODIUM 50 MCG PO TABS
50.0000 ug | ORAL_TABLET | Freq: Every day | ORAL | Status: DC
  Administered 2023-09-20 – 2023-09-26 (×7): 50 ug via ORAL
  Filled 2023-09-20 (×6): qty 1

## 2023-09-20 MED ORDER — CARVEDILOL 3.125 MG PO TABS
3.1250 mg | ORAL_TABLET | Freq: Two times a day (BID) | ORAL | Status: DC
  Administered 2023-09-20 – 2023-09-23 (×7): 3.125 mg via ORAL
  Filled 2023-09-20 (×10): qty 1

## 2023-09-20 NOTE — Progress Notes (Signed)
Pharmacy Antibiotic Note  Jose Lawrence is a 83 y.o. male admitted on 09/19/2023 with sepsis.  Pharmacy has been consulted for Vancomycin and Cefepime dosing.  Vancomycin 1 g IV given in ED at 11 pm  Plan: Vancomycin 1500 mg IV q24h Cefepime 2 g IV q12h  Height: 6' (182.9 cm) Weight: 89 kg (196 lb 3.4 oz) IBW/kg (Calculated) : 77.6  Temp (24hrs), Avg:98.7 F (37.1 C), Min:98.1 F (36.7 C), Max:99.2 F (37.3 C)  Recent Labs  Lab 09/19/23 1547 09/19/23 1605 09/19/23 1843  WBC 11.6*  --   --   CREATININE 1.11  --   --   LATICACIDVEN  --  3.1* 3.4*    Estimated Creatinine Clearance: 58.3 mL/min (by C-G formula based on SCr of 1.11 mg/dL).    No Known Allergies  Eddie Candle 09/20/2023 2:25 AM

## 2023-09-20 NOTE — ED Notes (Signed)
Renee RN to call back for report.

## 2023-09-20 NOTE — TOC Initial Note (Addendum)
Transition of Care [facility]) - Initial/Assessment Note    Patient Details  Name: Jose Lawrence MRN: [id number] Date of Birth: 11/17/1940  Transition of Care [facility]) CM/SW Contact:    Isabella Bowens, LCSWA Phone Number: 09/20/2023, 9:56 AM  Clinical Narrative:                 Patient is risk for readmission and was admitted for Acute febrile illness. CSW spoke with patient spouse who was at bedside. Spouse shared that it is her and patient in the home and she is like his nurse. Spouse shared that pt is still able to drive and that they do not have any community services such as HH that comes out. Patient also does not use any equipment. TOC will continue to follow.    Expected Discharge Plan: Home/Self Care Barriers to Discharge: Continued Medical Work up   Patient Goals and CMS Choice Patient states their goals for this hospitalization and ongoing recovery are:: return back home CMS Medicare.gov Compare Post Acute Care list provided to:: Patient Represenative (must comment) (Spouse Hattie) Choice offered to / list presented to : Spouse [place] ownership interest in [facility].provided to:: Spouse    Expected Discharge Plan and Services In-house Referral: Clinical Social Work Discharge Planning Services: CM Consult   Living arrangements for the past 2 months: Single Family Home                   Prior Living Arrangements/Services Living arrangements for the past 2 months: Single Family Home Lives with:: Spouse Patient language and need for interpreter reviewed:: Yes Do you feel safe going back to the place where you live?: Yes      Need for Family Participation in Patient Care: Yes (Comment) Care giver support system in place?: Yes (comment)   Criminal Activity/Legal Involvement Pertinent to Current Situation/Hospitalization: No - Comment as needed  Activities of Daily Living  Patient is independent , but spouse helps out as needed     Permission  Sought/Granted   Permission granted to share information with : Yes, Verbal Permission Granted  Share Information with NAME: Luetta Nutting     Permission granted to share info w Relationship: Spouse     Emotional Assessment Appearance:: Appears stated age   Affect (typically observed): Accepting, Appropriate Orientation: : Oriented to Self Alcohol / Substance Use: Not Applicable Psych Involvement: No (comment)  Admission diagnosis:  Acute febrile illness [R50.9] Patient Active Problem List   Diagnosis Date Noted   Vomiting 09/20/2023   Pleural effusion 09/20/2023   Hypoalbuminemia due to protein-calorie malnutrition (HCC) 09/20/2023   Acute febrile illness 09/19/2023   Bacteremia due to group B Streptococcus/Agalactiae 06/09/2023   UTI due to Klebsiella species 06/09/2023   Fever 06/08/2023   UTI (urinary tract infection) 06/08/2023   Hematuria 06/08/2023   Secondary esophageal varices without bleeding (HCC) 05/10/2023   Streptococcal bacteremia 04/23/2023   Urinary retention 04/22/2023   Cirrhosis of liver (HCC) 04/22/2023   Hypomagnesemia 04/22/2023   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   Cirrhosis of liver without ascites (HCC) 04/13/2023   Elevated LFTs 09/16/2022   Acute GI bleeding 10/03/2021   Elevated AST (SGOT)    Hypotension due to hypovolemia    Acute renal failure 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) 12/24/2020   Thrombocytopenia (HCC) 12/24/2020   Lactic acidosis 12/24/2020   Permanent atrial fibrillation (HCC) 12/24/2020   Uncontrolled type 2 diabetes mellitus with hyperglycemia, without long-term current use of insulin (  HCC) 12/24/2020   Elevated lactic acid level    Hyperkalemia    Hyponatremia    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   AKI (acute kidney injury) (HCC) 12/23/2020   Lumbar spondylosis 08/26/2020   Body mass index (BMI) 27.0-27.9, adult 07/02/2020   Elevated blood-pressure reading, without diagnosis of  hypertension 07/02/2020   Herniated nucleus pulposus, lumbar 06/30/2020   Spondylitis (HCC) 06/30/2020   Lumbar radiculopathy 06/23/2020   Spondylolisthesis at L5-S1 level 06/23/2020   Vitamin B 12 deficiency 07/10/2019   Macrocytic anemia 09/14/2018   Malaise and fatigue 09/14/2018   Indwelling Foley catheter present 09/14/2018   Intraoperative bladder injury 09/04/2018   Postoperative anemia due to acute blood loss 09/04/2018   Cancer of transverse colon (HCC)    Iron deficiency anemia due to chronic blood loss 08/25/2018   Acquired hypothyroidism 08/25/2018   HTN (hypertension) 08/25/2018   Controlled type 2 diabetes mellitus without complication (HCC) 08/25/2018   Colostomy in place [facility]) 08/25/2018   BPH (benign prostatic hyperplasia) 08/25/2018   Atrial fibrillation, chronic (HCC) 08/25/2018   Mass of colon 08/21/2018   S/P exploratory laparotomy 11/11/2015   Pneumoperitoneum 10/31/2015   Colon cancer (HCC) 01/12/2007   PCP:  Ignatius Specking, MD Pharmacy:   [facility] - [place], [place] - [address] [place] [address] Phone: [phone] Fax: [phone]     Social Drivers of Health (SDOH) Social History: SDOH Screenings   Food Insecurity: No Food Insecurity (06/08/2023)  Housing: Low Risk  (06/08/2023)  Transportation Needs: No Transportation Needs (06/08/2023)  Utilities: Not At Risk (06/08/2023)  Alcohol Screen: Low Risk  (09/01/2020)  Depression (PHQ2-9): Low Risk  (09/01/2020)  Financial Resource Strain: Low Risk  (09/01/2020)  Physical Activity: Sufficiently Active (09/01/2020)  Social Connections: Socially Integrated (09/01/2020)  Stress: No Stress Concern Present (09/01/2020)  Tobacco Use: Medium Risk (09/19/2023)   SDOH Interventions:     Readmission Risk Interventions    09/20/2023    9:49 AM 06/09/2023    2:44 PM  Readmission Risk Prevention Plan  Transportation Screening Complete Complete  PCP or  Specialist Appt within 3-5 Days  Not Complete  HRI or Home Care Consult Complete Complete  Social Work Consult for Recovery Care Planning/Counseling Complete Complete  Palliative Care Screening Not Applicable Not Applicable  Medication Review Ocean[employer]) Complete Complete

## 2023-09-20 NOTE — Plan of Care (Signed)

## 2023-09-20 NOTE — Progress Notes (Signed)
PHARMACY - PHYSICIAN COMMUNICATION CRITICAL VALUE ALERT - BLOOD CULTURE IDENTIFICATION (BCID)  Jose Lawrence is an 83 y.o. male who presented to [facility] on 09/19/2023 with a chief complaint of coughing, chills, and fever  Assessment:  83 y.o. 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  He complained of fever of 102.27F yesterday. Patient was admitted from 06/08/2023 to 06/10/2023 due to sepsis due to bacteremia with isolation of Streptococcus agalactiae from blood culture and patient was treated with IV Rocephin initially and discharged on amoxicillin 1 g 3 times daily for additional 8 days to complete 10 days of therapy.  He also had ESBL UTI with hematuria at that time. BCID BCx + Staph lugdunensis in 2/4 bottles.   Name of physician (or Provider) Contacted: Dr. Montez Morita  Current antibiotics: Vancomycin and Merrem  Changes to prescribed antibiotics recommended:  Patient is on recommended antibiotics - No changes needed  Results for orders placed or performed during the hospital encounter of 09/19/23  Blood Culture ID Panel (Reflexed) (Collected: 09/19/2023  4:05 PM)  Result Value Ref Range   Enterococcus faecalis NOT DETECTED NOT DETECTED   Enterococcus Faecium NOT DETECTED NOT DETECTED   Listeria monocytogenes NOT DETECTED NOT DETECTED   Staphylococcus species DETECTED (A) NOT DETECTED   Staphylococcus aureus (BCID) NOT DETECTED NOT DETECTED   Staphylococcus epidermidis NOT DETECTED NOT DETECTED   Staphylococcus lugdunensis DETECTED (A) NOT DETECTED   Streptococcus species NOT DETECTED NOT DETECTED   Streptococcus agalactiae NOT DETECTED NOT DETECTED   Streptococcus pneumoniae NOT DETECTED NOT DETECTED    Streptococcus pyogenes NOT DETECTED NOT DETECTED   A.calcoaceticus-baumannii NOT DETECTED NOT DETECTED   Bacteroides fragilis NOT DETECTED NOT DETECTED   Enterobacterales NOT DETECTED NOT DETECTED   Enterobacter cloacae complex NOT DETECTED NOT DETECTED   Escherichia coli NOT DETECTED NOT DETECTED   Klebsiella aerogenes NOT DETECTED NOT DETECTED   Klebsiella oxytoca NOT DETECTED NOT DETECTED   Klebsiella pneumoniae NOT DETECTED NOT DETECTED   Proteus species NOT DETECTED NOT DETECTED   Salmonella species NOT DETECTED NOT DETECTED   Serratia marcescens NOT DETECTED NOT DETECTED   Haemophilus influenzae NOT DETECTED NOT DETECTED   Neisseria meningitidis NOT DETECTED NOT DETECTED   Pseudomonas aeruginosa NOT DETECTED NOT DETECTED   Stenotrophomonas maltophilia NOT DETECTED NOT DETECTED   Candida albicans NOT DETECTED NOT DETECTED   Candida auris NOT DETECTED NOT DETECTED   Candida glabrata NOT DETECTED NOT DETECTED   Candida krusei NOT DETECTED NOT DETECTED   Candida parapsilosis NOT DETECTED NOT DETECTED   Candida tropicalis NOT DETECTED NOT DETECTED   Cryptococcus neoformans/gattii NOT DETECTED NOT DETECTED   Methicillin resistance mecA/C DETECTED (A) NOT DETECTED    Elder Cyphers, BS Pharm D, BCPS Clinical Pharmacist 09/20/2023  1:41 PM

## 2023-09-20 NOTE — Hospital Course (Addendum)
Vominting coughing rigors.   Since Saturday 102 fever Sunday.    Noi vomiting, not coughing much. Some yesterday.   Loose output. Scrotal edema  Swelling on legs   1-2+ BLE.   No dysuria.   TTE?

## 2023-09-20 NOTE — Progress Notes (Signed)
Pharmacy Antibiotic Note  Jose Lawrence is a 83 y.o. male admitted on 09/19/2023 with sepsis.  Pharmacy has been consulted for Vancomycin and merrem dosing. Recent h/o ESBL K. Pneumo 06/09/23 in urine  Plan: Merrem 1gm IV q8h Continue Vancomycin 1500 mg IV Q 24 hrs. Goal AUC 400-550. Expected AUC: 523 SCr used: 1.19 F/U cxs and clinical progress Monitor V/S, labs and levels as indicated   Height: 6' (182.9 cm) Weight: 89 kg (196 lb 3.4 oz) IBW/kg (Calculated) : 77.6  Temp (24hrs), Avg:98.6 F (37 C), Min:98.1 F (36.7 C), Max:99.2 F (37.3 C)  Recent Labs  Lab 09/19/23 1547 09/19/23 1605 09/19/23 1843 09/20/23 0515  WBC 11.6*  --   --  10.8*  CREATININE 1.11  --   --  1.19  LATICACIDVEN  --  3.1* 3.4* 3.2*    Estimated Creatinine Clearance: 54.3 mL/min (by C-G formula based on SCr of 1.19 mg/dL).    No Known Allergies  Antimicrobials this admission: Merrem 2/11> Vancomycin 2/10>> Cefepime 2/10>> 2/11 Metronidazole 2/10 x 1 dose in ED  Microbiology results: 2/10 BCX: ngtd 2.10 flu, covid are negative  Thank you for allowing pharmacy to be a part of this patient's care.  Elder Cyphers, BS Pharm D, BCPS Clinical Pharmacist 09/20/2023 9:09 AM

## 2023-09-21 ENCOUNTER — Inpatient Hospital Stay (HOSPITAL_COMMUNITY): Payer: Medicare Other

## 2023-09-21 ENCOUNTER — Encounter (HOSPITAL_COMMUNITY): Payer: Self-pay | Admitting: Internal Medicine

## 2023-09-21 DIAGNOSIS — I38 Endocarditis, valve unspecified: Secondary | ICD-10-CM | POA: Diagnosis not present

## 2023-09-21 DIAGNOSIS — R7881 Bacteremia: Secondary | ICD-10-CM | POA: Diagnosis not present

## 2023-09-21 DIAGNOSIS — R509 Fever, unspecified: Secondary | ICD-10-CM | POA: Diagnosis not present

## 2023-09-21 DIAGNOSIS — B958 Unspecified staphylococcus as the cause of diseases classified elsewhere: Secondary | ICD-10-CM

## 2023-09-21 LAB — CBC WITH DIFFERENTIAL/PLATELET
Abs Immature Granulocytes: 0.02 10*3/uL (ref 0.00–0.07)
Basophils Absolute: 0.1 10*3/uL (ref 0.0–0.1)
Basophils Relative: 1 %
Eosinophils Absolute: 0.3 10*3/uL (ref 0.0–0.5)
Eosinophils Relative: 5 %
HCT: 28.1 % — ABNORMAL LOW (ref 39.0–52.0)
Hemoglobin: 9.5 g/dL — ABNORMAL LOW (ref 13.0–17.0)
Immature Granulocytes: 0 %
Lymphocytes Relative: 16 %
Lymphs Abs: 0.9 10*3/uL (ref 0.7–4.0)
MCH: 37.5 pg — ABNORMAL HIGH (ref 26.0–34.0)
MCHC: 33.8 g/dL (ref 30.0–36.0)
MCV: 111.1 fL — ABNORMAL HIGH (ref 80.0–100.0)
Monocytes Absolute: 0.9 10*3/uL (ref 0.1–1.0)
Monocytes Relative: 16 %
Neutro Abs: 3.6 10*3/uL (ref 1.7–7.7)
Neutrophils Relative %: 62 %
Platelets: 53 10*3/uL — ABNORMAL LOW (ref 150–400)
RBC: 2.53 MIL/uL — ABNORMAL LOW (ref 4.22–5.81)
RDW: 15 % (ref 11.5–15.5)
Smear Review: DECREASED
WBC: 5.8 10*3/uL (ref 4.0–10.5)
nRBC: 0 % (ref 0.0–0.2)

## 2023-09-21 LAB — COMPREHENSIVE METABOLIC PANEL
ALT: 26 U/L (ref 0–44)
AST: 43 U/L — ABNORMAL HIGH (ref 15–41)
Albumin: 2 g/dL — ABNORMAL LOW (ref 3.5–5.0)
Alkaline Phosphatase: 88 U/L (ref 38–126)
Anion gap: 3 — ABNORMAL LOW (ref 5–15)
BUN: 31 mg/dL — ABNORMAL HIGH (ref 8–23)
CO2: 25 mmol/L (ref 22–32)
Calcium: 8.2 mg/dL — ABNORMAL LOW (ref 8.9–10.3)
Chloride: 102 mmol/L (ref 98–111)
Creatinine, Ser: 1.33 mg/dL — ABNORMAL HIGH (ref 0.61–1.24)
GFR, Estimated: 54 mL/min — ABNORMAL LOW (ref >60)
Glucose, Bld: 142 mg/dL — ABNORMAL HIGH (ref 70–99)
Potassium: 4.9 mmol/L (ref 3.5–5.1)
Sodium: 130 mmol/L — ABNORMAL LOW (ref 135–145)
Total Bilirubin: 2.1 mg/dL — ABNORMAL HIGH (ref 0.0–1.2)
Total Protein: 5.9 g/dL — ABNORMAL LOW (ref 6.5–8.1)

## 2023-09-21 LAB — BODY FLUID CELL COUNT WITH DIFFERENTIAL
Lymphs, Fluid: 42 %
Monocyte-Macrophage-Serous Fluid: 22 % — ABNORMAL LOW (ref 50–90)
Neutrophil Count, Fluid: 36 % — ABNORMAL HIGH (ref 0–25)
Total Nucleated Cell Count, Fluid: 109 uL (ref 0–1000)

## 2023-09-21 LAB — LACTATE DEHYDROGENASE, PLEURAL OR PERITONEAL FLUID: LD, Fluid: 42 U/L — ABNORMAL HIGH (ref 3–23)

## 2023-09-21 LAB — GRAM STAIN

## 2023-09-21 LAB — ECHOCARDIOGRAM COMPLETE
Area-P 1/2: 2.77 cm2
S' Lateral: 2.9 cm

## 2023-09-21 LAB — GLUCOSE, PLEURAL OR PERITONEAL FLUID: Glucose, Fluid: 159 mg/dL

## 2023-09-21 MED ORDER — LIDOCAINE HCL (PF) 2 % IJ SOLN
10.0000 mL | Freq: Once | INTRAMUSCULAR | Status: AC
  Administered 2023-09-21: 10 mL

## 2023-09-21 MED ORDER — LIDOCAINE HCL (PF) 2 % IJ SOLN
INTRAMUSCULAR | Status: AC
  Filled 2023-09-21: qty 10

## 2023-09-21 NOTE — Consult Note (Signed)
Virtual Visit via Telephone/Video Note   I connected with Jose Lawrence   On 09/21/2023 at 2:33 PM  by Video and verified that I am speaking with the correct person using two identifiers.   I discussed the limitations, risks, security and privacy concerns of performing an evaluation and management service by video and the availability of in person appointments Location:   Patient: AP Provider: [facility]    Date of Admission:  09/19/2023   Total days of inpatient antibiotics 1        Reason for Consult: Staph Lugdunensis bacteremia   Principal Problem:   Acute febrile illness Active Problems:   Acquired hypothyroidism   Atrial fibrillation, chronic (HCC)   Cancer of transverse colon (HCC)   Macrocytic anemia   Thrombocytopenia (HCC)   Lactic acidosis   Vomiting   Pleural effusion   Hypoalbuminemia due to protein-calorie malnutrition [facility])   Assessment: 83 year old male with history of cirrhosis, group B strep bacteremia back in 2024 admitted with: #Staph lugdunensis bacteremia - Blood culture from admission 2/10 grew 2/2 sets staph lugdunensis.  Chest x-ray showed developing pleural effusion with adjacent opacity right greater than left.  Patient has no respiratory symptoms - CT chest abdomen pelvis showed pleural effusion, almost complete collapse of the right lower lobe.  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 - Denies any recent procedures, no dental work, no GI procedures recently. - Right-sided thoracentesis today with 2.2 L clear fluid removed.  Cultures pending. -I suspect etiology of bacteremia is respiratory versus GI translocation given varices Recommendations:  -Repeat blood cultures, follow - Continue vancomycin - Follow-up TTE, anticipate will need TEE - Follow thoracentesis cultures/pathology and  studies  Evaluation of this patient requires complex antimicrobial therapy evaluation and counseling + isolation needs for disease transmission risk assessment and mitigation   Microbiology:   Antibiotics:  Vancomycin Cultures: Blood 2/10 2/2 staph lugdunensis 2/11 pending  Urine  Other   HPI: Jose Lawrence is a 83 y.o. male with past medical history of PE, BPH with diabetes mellitus, colon cancer s/p resection colostomy, Cirrhosis with varices, strep bacteremia treated with 10 days of antibiotics ceftriaxone to high-dose amoxicillin in November/October 2024 presented to the ED with with a 2-day history of coughing, vomit, chills, sneezing.  He had a fever.  Upon arrival to the ED patient afebrile, WBC 26K.  Chest x-ray showed pleural effusion.  CTA showed interval increase in pleural effusion.  Started on cefepime, metronidazole and vancomycin.  ID engaged as blood cultures with staph lugdunensis.  Review of Systems: Review of Systems  All other systems reviewed and are negative.   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 Lita Mains   PE (pulmonary embolism)    Temporarily on Eliquis 2017   Skin cancer    35 years ago   Type 2 diabetes mellitus (HCC)    Vitamin B 12 deficiency 07/10/2019    Social History   Tobacco Use   Smoking status: Former    Current packs/day: 0.00    Average packs/day: 1  pack/day for 27.0 years (27.0 ttl pk-yrs)    Types: Cigarettes    Start date: 01/27/1964    Quit date: 01/27/1991    Years since quitting: 32.6   Smokeless tobacco: Former    Types: Chew    Quit date: 08/10/1995  Vaping Use   Vaping status: Never Used  Substance Use Topics   Alcohol use: Never     Alcohol/week: 0.0 standard drinks of alcohol   Drug use: Never    Family History  Problem Relation Age of Onset   Heart disease Father    Heart failure Father    Heart attack Father    Prostate cancer Paternal Uncle    Scheduled Meds:  carvedilol  3.125 mg Oral BID WC   feeding supplement (GLUCERNA SHAKE)  237 mL Oral TID BM   levothyroxine  50 mcg Oral Q0600   Continuous Infusions:  vancomycin 1,500 mg (09/21/23 1108)   PRN Meds:.guaiFENesin-dextromethorphan, ondansetron **OR** ondansetron (ZOFRAN) IV No Known Allergies  OBJECTIVE: Blood pressure (!) 120/59, pulse (!) 43, temperature 97.8 F (36.6 C), temperature source Oral, resp. rate 18, height 6' (1.829 m), weight 89 kg, SpO2 100%.  Physical Exam Constitutional:      General: He is not in acute distress.    Appearance: He is normal weight. He is not toxic-appearing.  HENT:     Head: Normocephalic and atraumatic.     Right Ear: External ear normal.     Left Ear: External ear normal.     Nose: No congestion or rhinorrhea.     Mouth/Throat:     Mouth: Mucous membranes are moist.     Pharynx: Oropharynx is clear.  Eyes:     Extraocular Movements: Extraocular movements intact.     Conjunctiva/sclera: Conjunctivae normal.     Pupils: Pupils are equal, round, and reactive to light.  Musculoskeletal:        General: No swelling. Normal range of motion.     Cervical back: Normal range of motion and neck supple.  Skin:    General: Skin is warm and dry.  Neurological:     General: No focal deficit present.     Mental Status: He is oriented to person, place, and time.  Psychiatric:        Mood and Affect: Mood normal.     Lab Results Lab Results  Component Value Date   WBC 5.8 09/21/2023   HGB 9.5 (L) 09/21/2023   HCT 28.1 (L) 09/21/2023   MCV 111.1 (H) 09/21/2023   PLT 53 (L) 09/21/2023    Lab Results  Component Value Date   CREATININE 1.33 (H) 09/21/2023   BUN 31 (H) 09/21/2023   NA 130 (L) 09/21/2023    K 4.9 09/21/2023   CL 102 09/21/2023   CO2 25 09/21/2023    Lab Results  Component Value Date   ALT 26 09/21/2023   AST 43 (H) 09/21/2023   ALKPHOS 88 09/21/2023   BILITOT 2.1 (H) 09/21/2023       Danelle Earthly, MD [facility] 09/21/2023, 2:33 PM

## 2023-09-21 NOTE — Plan of Care (Signed)

## 2023-09-21 NOTE — Progress Notes (Addendum)
  Progress Note   Patient: Jose Lawrence HQI:[id number] DOB: 12-27-1940 DOA: 09/19/2023     2 DOS: the patient was seen and examined on 09/21/2023   Brief hospital course:  Assessment and Plan: Bacteremia Staph lugdunensis  Patient presented with fever, rigors, N/V. Viral panel was negative. Found to have + Bcx in 4 out of 4 bottles. No obvious skin breakdown noted, though patient's daughter states he sometimes has some skin breakdown around ostomy. Patient is currently on merrem and vancomycin. He has had no fevers in the hospital.  -ID consulted -Continue vancomycin  Elevated lactate  Leukocytosis Elevated up to 3.4. Possibly in setting of hypovolemia (n/V) and decreased clearance. Noted leukocytosis per above.  Now that NV has resolved continue to encourage PO intake.   Pleural effusion  CT chest showed mod R pleural effusion in the setting of his cirrhosis. Patient reports he has been symptomatic from this for about 2 weeks now. IR consulted on admission.  -f/u pleural fluid studies   Non alcoholic cirrhosis  Coagulopathy  Mildly elevated AST and tbili 4. INR 2.5. MELDNa 28. Has had extensive work up, etiology remains unclear. No encephalopathy or significant ascites.  Hold diuretic for now due to N/V  Hyponatremia  Likely multifactorial. Hypovolemia from N/V and cirrhosis  Continue to monitor with continued PO intake.   Macrocytic anemia  Chronic thrombocytopenia  In the setting of his cirrhosis.  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 Ellin Saba   Acquired hypothyroidism  Continue synthroid   Atrial fibrillation, chronic Continue Coreg for rate control, patient not on anticoagulation possibly because of persistent thrombocytopenia and anemia    Controlled type 2 diabetes mellitus without complication  Diet controlled at this time    Subjective:  Patient's N/V have resolved. He denies any dysuria. States he has had some shortness of breath for a few weeks now. Symptoms started about two days prior to admission.   Physical Exam: Vitals:   09/20/23 1449 09/20/23 1958 09/20/23 2205 09/21/23 0248  BP: 124/65 (!) 114/57 101/66 (!) 116/59  Pulse: (!) 53 (!) 49 (!) 58 (!) 46  Resp: 18 20 18 18   Temp: 97.7 F (36.5 C) 98.4 F (36.9 C) 97.7 F (36.5 C) 98.2 F (36.8 C)  TempSrc: Oral Oral Oral Oral  SpO2: 99% 99% 97% 96%  Weight:      Height:       Physical Exam  Constitutional: In no distress. Hard of hearing  Cardiovascular: bradycardic rate, regular rhythm. Trace to 1+ of BLE  Pulmonary: Non labored breathing on room air, no wheezing or rales.  Abdominal: Soft. Normal bowel sounds. Non distended and non tender. Ostomy appliance in place, surrounding skin with no breakdown or erythema. Dark green liquid stool  Musculoskeletal: Normal range of motion.     Neurological: Alert and oriented to person, place, and time. Non focal  Skin: Skin is warm and dry.   Data Reviewed:  I have reviewed images and labs   Family Communication: Spouse and daughter at bedside.   Disposition: Status is: Inpatient Remains inpatient appropriate because: Treatment of bacteremia.   Planned Discharge Destination: Home    Time spent: 35 minutes  Author: Marolyn Haller, MD 09/21/2023 3:38 AM  For on call review [email or web address].

## 2023-09-21 NOTE — Progress Notes (Signed)
PROGRESS NOTE   Jose Lawrence  ZOX:[id number] DOB: 10-09-1940 DOA: 09/19/2023 PCP: Ignatius Specking, MD   Chief Complaint  Patient presents with   Influenza   Level of care: Med-Surg  Brief Admission History:  83 y.o. 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  He complained of fever of 102.49F yesterday.  Patient was admitted from 06/08/2023 to 06/10/2023 due to sepsis due to bacteremia with isolation of Streptococcus agalactiae from blood culture and patient was treated with IV Rocephin initially and discharged on amoxicillin 1 g 3 times daily for additional 8 days to complete 10 days of therapy.  He also had ESBL UTI with hematuria at that time.   ED Course:  In the emergency department, BP was 171/70, other vital signs are within normal range.  Workup in the ED showed WBC 11.6, hemoglobin 9.0, hematocrit 33.6, MCV 111.3.  BMP was normal except for sodium of 132, bicarb 21, blood glucose 119, albumin 2.5, AST 63, ALT 37, ALP 99, total bilirubin 5.8.  Lactic acid 3.1 > 3.4, urinalysis was normal.  Influenza A, B, SARS-CoV-2, RSV was negative.  CT chest, abdomen and pelvis with contrast showed interval increase in size of a moderate right and small left pleural effusion. Almost complete collapse of the right lower lobe.  Cirrhosis with portal hypertension. No focal liver lesions identified.  Chest x-ray showed developing pleural effusions with adjacent opacity, right greater than left.  Patient was empirically started on IV cefepime, metronidazole and vancomycin.  Zofran was given and IV hydration per sepsis protocol was provided Hospitalist was asked to admit patient for further evaluation and management.    Assessment and Plan:  Staph lugdunensis Bacteremia  -ID consult appreciated, no vegetations seen on TTE, will ask cardiology to consider TEE on 2/13 -Continue vancomycin per ID -repeat BCs ordered 2/12 per ID    Lactic acidosis - resolved   Leukocytosis -continue supportive measures     Pleural effusion Right  CT chest showed mod R pleural effusion in the setting of his cirrhosis. Patient reports he has been symptomatic from this for about 2 weeks now. IR consulted on admission.  -US thoracentesis completed 2/12 with 2.2 L fluid removed  -f/u pleural fluid studies    Non alcoholic cirrhosis  Coagulopathy -stable, follow    Mildly elevated AST and tbili 4. INR 2.5. MELDNa 28. Has had extensive work up, etiology remains unclear. No encephalopathy or significant ascites.  -Holding diuretic for now due to N/V   Hyponatremia  Likely multifactorial. Hypovolemia from N/V and cirrhosis  Continue to monitor with continued PO intake.    Macrocytic anemia  Chronic thrombocytopenia  In the setting of his cirrhosis.  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 Ellin Saba   Acquired hypothyroidism  Continue synthroid    Atrial fibrillation, chronic Continue Coreg for rate control, patient not on anticoagulation possibly because of persistent thrombocytopenia and anemia    Controlled type 2 diabetes mellitus without complication  Diet controlled at this time  DVT prophylaxis: SCDs Code Status: Full  Family Communication: wife bedside 2/12 Disposition: anticipate home in 1-2 days  Consultants:  ID Cardiology  for TEE (pending)  Procedures:  US thoracentesis 2/12: 2.2L fluid removed   Antimicrobials:  Vancomycin 2/10>>   Subjective: Pt having some SOB and agreeable to thoracentesis today.   Objective: Vitals:   09/21/23 0634 09/21/23 1020 09/21/23 1030  09/21/23 1336  BP: (!) 132/59 129/63 135/62 (!) 120/59  Pulse: (!) 51 (!) 50 (!) 51 (!) 43  Resp: 18 18 18 18   Temp: 97.9 F (36.6 C) 98 F (36.7 C)  97.8 F (36.6 C)  TempSrc: Oral Oral  Oral  SpO2: 97% 99% 99% 100%  Weight:      Height:        Intake/Output Summary (Last 24 hours) at 09/21/2023 1555 Last data filed at 09/21/2023 1330 Gross per 24 hour  Intake 1020.69 ml  Output --  Net 1020.69 ml   Filed Weights   09/19/23 1528  Weight: 89 kg   Examination:  General exam: Appears calm and comfortable  Respiratory system: Clear to auscultation. Respiratory effort normal. Cardiovascular system: normal S1 & S2 heard. No JVD, murmurs, rubs, gallops or clicks. No pedal edema. Gastrointestinal system: Abdomen is nondistended, soft and nontender. No organomegaly or masses felt. Normal bowel sounds heard. Central nervous system: Alert and oriented. No focal neurological deficits. Extremities: Symmetric 5 x 5 power. Skin: No rashes, lesions or ulcers. Psychiatry: Judgement and insight appear normal. Mood & affect appropriate.   Data Reviewed: I have personally reviewed following labs and imaging studies  CBC: Recent Labs  Lab 09/19/23 1547 09/20/23 0515 09/21/23 0512  WBC 11.6* 10.8* 5.8  NEUTROABS 10.4*  --  3.6  HGB 11.0* 9.4* 9.5*  HCT 33.6* 28.0* 28.1*  MCV 111.3* 110.7* 111.1*  PLT 60* 58* 53*    Basic Metabolic Panel: Recent Labs  Lab 09/19/23 1547 09/20/23 0515 09/21/23 0512  NA 132* 129* 130*  K 4.4 4.6 4.9  CL 102 102 102  CO2 21* 21* 25  GLUCOSE 119* 137* 142*  BUN 20 24* 31*  CREATININE 1.11 1.19 1.33*  CALCIUM 8.6* 7.9* 8.2*  MG  --  1.7  --   PHOS  --  3.0  --     CBG: No results for input(s): "GLUCAP" in the last 168 hours.  Recent Results (from the past 240 hours)  Resp panel by RT-PCR (RSV, Flu A&B, Covid) Anterior Nasal Swab     Status: None   Collection Time: 09/19/23  2:34 PM   Specimen: Anterior Nasal Swab  Result Value Ref Range  Status   SARS Coronavirus 2 by RT PCR NEGATIVE NEGATIVE Final    Comment: (NOTE) SARS-CoV-2 target nucleic acids are NOT DETECTED.  The SARS-CoV-2 RNA is generally detectable in upper respiratory specimens during the acute phase of infection. The lowest concentration of SARS-CoV-2 viral copies this assay can detect is 138 copies/mL. A negative result does not preclude SARS-Cov-2 infection and should not be used as the sole basis for treatment or other patient management decisions. A negative result may occur with  improper specimen collection/handling, submission of specimen other than nasopharyngeal swab, presence of viral mutation(s) within the areas targeted by this assay, and inadequate number of viral copies(<138 copies/mL). A negative result must be combined with clinical observations, patient history, and epidemiological information. The expected result is Negative.  Fact Sheet for Patients:  BloggerCourse.com  Fact Sheet for Healthcare Providers:  SeriousBroker.it  This test is no t yet approved or cleared by the [place] FDA and  has been authorized for detection and/or diagnosis  of SARS-CoV-2 by FDA under an Emergency Use Authorization (EUA). This EUA will remain  in effect (meaning this test can be used) for the duration of the COVID-19 declaration under Section 564(b)(1) of the Act, 21 U.S.C.section 360bbb-3(b)(1), unless the authorization is terminated  or revoked sooner.       Influenza A by PCR NEGATIVE NEGATIVE Final   Influenza B by PCR NEGATIVE NEGATIVE Final    Comment: (NOTE) The Xpert Xpress SARS-CoV-2/FLU/RSV plus assay is intended as an aid in the diagnosis of influenza from Nasopharyngeal swab specimens and should not be used as a sole basis for treatment. Nasal washings and aspirates are unacceptable for Xpert Xpress SARS-CoV-2/FLU/RSV testing.  Fact Sheet for  Patients: BloggerCourse.com  Fact Sheet for Healthcare Providers: SeriousBroker.it  This test is not yet approved or cleared by the [place] FDA and has been authorized for detection and/or diagnosis of SARS-CoV-2 by FDA under an Emergency Use Authorization (EUA). This EUA will remain in effect (meaning this test can be used) for the duration of the COVID-19 declaration under Section 564(b)(1) of the Act, 21 U.S.C. section 360bbb-3(b)(1), unless the authorization is terminated or revoked.     Resp Syncytial Virus by PCR NEGATIVE NEGATIVE Final    Comment: (NOTE) Fact Sheet for Patients: BloggerCourse.com  Fact Sheet for Healthcare Providers: SeriousBroker.it  This test is not yet approved or cleared by the [place] FDA and has been authorized for detection and/or diagnosis of SARS-CoV-2 by FDA under an Emergency Use Authorization (EUA). This EUA will remain in effect (meaning this test can be used) for the duration of the COVID-19 declaration under Section 564(b)(1) of the Act, 21 U.S.C. section 360bbb-3(b)(1), unless the authorization is terminated or revoked.  Performed at [facility], [address], [place], [place] [address]   Blood culture (routine x 2)     Status: Abnormal (Preliminary result)   Collection Time: 09/19/23  4:05 PM   Specimen: BLOOD  Result Value Ref Range Status   Specimen Description   Final    BLOOD RIGHT ANTECUBITAL Performed at [facility], [address], [place], [place] [address]    Special Requests   Final    BOTTLES DRAWN AEROBIC AND ANAEROBIC Blood Culture results may not be optimal due to an inadequate volume of blood received in culture bottles Performed at [facility], [address], [place], [place] [address]    Culture  Setup Time   Final    GRAM POSITIVE COCCI IN BOTH AEROBIC AND ANAEROBIC BOTTLES Gram Stain Report Called  to,Read Back By and Verified With: [facility] WHITE AT 0919 09/20/2023 BY A. SNYDER CRITICAL RESULT CALLED TO, READ BACK BY AND VERIFIED WITH: PHARMD LORI POOLE ON 09/20/23 @ 1332 BY DRT    Culture (A)  Final    STAPHYLOCOCCUS LUGDUNENSIS SUSCEPTIBILITIES TO FOLLOW Performed at [facility], [address], [place], [place] [address]    Report Status PENDING  Incomplete  Blood Culture ID Panel (Reflexed)     Status: Abnormal   Collection Time: 09/19/23  4:05 PM  Result Value Ref Range Status   Enterococcus faecalis NOT DETECTED NOT DETECTED Final   Enterococcus Faecium NOT DETECTED NOT DETECTED Final   Listeria monocytogenes NOT DETECTED NOT DETECTED Final   Staphylococcus species DETECTED (A) NOT DETECTED Final    Comment: CRITICAL RESULT CALLED TO, READ BACK BY AND VERIFIED WITH: PHARMD LORI POOLE ON 09/20/23 @ 1332 BY DRT    Staphylococcus aureus (BCID) NOT DETECTED NOT  DETECTED Final   Staphylococcus epidermidis NOT DETECTED NOT DETECTED Final   Staphylococcus lugdunensis DETECTED (A) NOT DETECTED Final    Comment: Methicillin (oxacillin) resistant coagulase negative staphylococcus. Possible blood culture contaminant (unless isolated from more than one blood culture draw or clinical case suggests pathogenicity). No antibiotic treatment is indicated for blood  culture contaminants. CRITICAL RESULT CALLED TO, READ BACK BY AND VERIFIED WITH: PHARMD LORI POOLE ON 09/20/23 @ 1332 BY DRT    Streptococcus species NOT DETECTED NOT DETECTED Final   Streptococcus agalactiae NOT DETECTED NOT DETECTED Final   Streptococcus pneumoniae NOT DETECTED NOT DETECTED Final   Streptococcus pyogenes NOT DETECTED NOT DETECTED Final   A.calcoaceticus-baumannii NOT DETECTED NOT DETECTED Final   Bacteroides fragilis NOT DETECTED NOT DETECTED Final   Enterobacterales NOT DETECTED NOT DETECTED Final   Enterobacter cloacae complex NOT DETECTED NOT DETECTED Final   Escherichia coli NOT DETECTED NOT DETECTED  Final   Klebsiella aerogenes NOT DETECTED NOT DETECTED Final   Klebsiella oxytoca NOT DETECTED NOT DETECTED Final   Klebsiella pneumoniae NOT DETECTED NOT DETECTED Final   Proteus species NOT DETECTED NOT DETECTED Final   Salmonella species NOT DETECTED NOT DETECTED Final   Serratia marcescens NOT DETECTED NOT DETECTED Final   Haemophilus influenzae NOT DETECTED NOT DETECTED Final   Neisseria meningitidis NOT DETECTED NOT DETECTED Final   Pseudomonas aeruginosa NOT DETECTED NOT DETECTED Final   Stenotrophomonas maltophilia NOT DETECTED NOT DETECTED Final   Candida albicans NOT DETECTED NOT DETECTED Final   Candida auris NOT DETECTED NOT DETECTED Final   Candida glabrata NOT DETECTED NOT DETECTED Final   Candida krusei NOT DETECTED NOT DETECTED Final   Candida parapsilosis NOT DETECTED NOT DETECTED Final   Candida tropicalis NOT DETECTED NOT DETECTED Final   Cryptococcus neoformans/gattii NOT DETECTED NOT DETECTED Final   Methicillin resistance mecA/C DETECTED (A) NOT DETECTED Final    Comment: CRITICAL RESULT CALLED TO, READ BACK BY AND VERIFIED WITH: PHARMD LORI POOLE ON 09/20/23 @ 1332 BY DRT Performed at [facility], [address], [place], [place] [address]   Blood culture (routine x 2)     Status: Abnormal (Preliminary result)   Collection Time: 09/19/23  4:40 PM   Specimen: BLOOD  Result Value Ref Range Status   Specimen Description   Final    BLOOD LEFT ANTECUBITAL Performed at [facility], [address] [place], [place] [address]    Special Requests   Final    BOTTLES DRAWN AEROBIC AND ANAEROBIC Blood Culture adequate volume Performed at [facility], [address] [place], [place] [address]    Culture  Setup Time   Final    GRAM POSITIVE COCCI IN BOTH AEROBIC AND ANAEROBIC BOTTLES Gram Stain Report Called to,Read Back By and Verified With: MEGAN WHITE, RN AT [phone] 09/20/2023 BY Kandice Moos Performed at [facility], [address], [place], [place] [address]     Culture STAPHYLOCOCCUS LUGDUNENSIS (A)  Final   Report Status PENDING  Incomplete  Respiratory (~20 pathogens) panel by PCR     Status: None   Collection Time: 09/20/23  6:53 AM   Specimen: Nasopharyngeal Swab; Respiratory  Result Value Ref Range Status   Adenovirus NOT DETECTED NOT DETECTED Final   Coronavirus 229E NOT DETECTED NOT DETECTED Final    Comment: (NOTE) The Coronavirus on the Respiratory Panel, DOES NOT test for the novel  Coronavirus (2019 nCoV)    Coronavirus HKU1 NOT DETECTED NOT DETECTED Final   Coronavirus NL63  NOT DETECTED NOT DETECTED Final   Coronavirus OC43 NOT DETECTED NOT DETECTED Final   Metapneumovirus NOT DETECTED NOT DETECTED Final   Rhinovirus / Enterovirus NOT DETECTED NOT DETECTED Final   Influenza A NOT DETECTED NOT DETECTED Final   Influenza B NOT DETECTED NOT DETECTED Final   Parainfluenza Virus 1 NOT DETECTED NOT DETECTED Final   Parainfluenza Virus 2 NOT DETECTED NOT DETECTED Final   Parainfluenza Virus 3 NOT DETECTED NOT DETECTED Final   Parainfluenza Virus 4 NOT DETECTED NOT DETECTED Final   Respiratory Syncytial Virus NOT DETECTED NOT DETECTED Final   Bordetella pertussis NOT DETECTED NOT DETECTED Final   Bordetella Parapertussis NOT DETECTED NOT DETECTED Final   Chlamydophila pneumoniae NOT DETECTED NOT DETECTED Final   Mycoplasma pneumoniae NOT DETECTED NOT DETECTED Final    Comment: Performed at [facility], [address] [place], [place] [address]  Culture, blood (Routine X 2) w Reflex to ID Panel     Status: None (Preliminary result)   Collection Time: 09/20/23  3:45 PM   Specimen: Left Antecubital; Blood  Result Value Ref Range Status   Specimen Description   Final    LEFT ANTECUBITAL BOTTLES DRAWN AEROBIC AND ANAEROBIC   Special Requests Blood Culture adequate volume  Final   Culture   Final    NO GROWTH < 12 HOURS Performed at [facility], [address] [place], [place] [address]    Report Status PENDING  Incomplete   Culture, blood (Routine X 2) w Reflex to ID Panel     Status: None (Preliminary result)   Collection Time: 09/20/23  3:46 PM   Specimen: Right Antecubital; Blood  Result Value Ref Range Status   Specimen Description   Final    RIGHT ANTECUBITAL BOTTLES DRAWN AEROBIC AND ANAEROBIC   Special Requests Blood Culture adequate volume  Final   Culture   Final    NO GROWTH < 12 HOURS Performed at [facility], [address] [place], [place] [address]    Report Status PENDING  Incomplete  Gram stain     Status: None   Collection Time: 09/21/23 10:20 AM   Specimen: Pleura  Result Value Ref Range Status   Specimen Description PLEURAL  Final   Special Requests NONE  Final   Gram Stain   Final    WBC PRESENT, PREDOMINANTLY MONONUCLEAR NO ORGANISMS SEEN CYTOSPIN SMEAR Performed at [facility], [address], [place], [place] [address]    Report Status 09/21/2023 FINAL  Final     Radiology Studies: ECHOCARDIOGRAM COMPLETE Result Date: 09/21/2023    ECHOCARDIOGRAM REPORT   Patient Name:   XAVIUS SPADAFORE Date of Exam: 09/21/2023 Medical Rec #:  [id number]         Height:       72.0 in Accession #:    [id number]        Weight:       196.2 lb Date of Birth:  [birth year]         BSA:          2.113 m Patient Age:    82 years          BP:           120/59 mmHg Patient Gender: M                 HR:           41 bpm. [facility] Procedure: 2D Echo, Color Doppler  and Cardiac Doppler (Both Spectral and Color            Flow Doppler were utilized during procedure). Indications:    Endocarditis [[id number]]  History:        Patient has no prior history of Echocardiogram examinations and                 Patient has prior history of Echocardiogram examinations, most                 recent 05/30/2023. Arrythmias:Atrial Fibrillation; Risk                 Factors:Hypertension and Diabetes.  Sonographer:    Webb Laws Referring Phys: [id number] [facility] SINGH IMPRESSIONS  1. Left ventricular ejection  fraction, by estimation, is 55 to 60%. The left ventricle has normal function. The left ventricle has no regional wall motion abnormalities. Left ventricular diastolic parameters are indeterminate.  2. Right ventricular systolic function is low normal. The right ventricular size is mildly enlarged. There is moderately elevated pulmonary artery systolic pressure. The estimated right ventricular systolic pressure is 46.1 mmHg.  3. Left atrial size was upper normal.  4. Right atrial size was mildly dilated.  5. Ascitic fluid and left pleural effusion noted.  6. The mitral valve is grossly normal. Mild mitral valve regurgitation.  7. The aortic valve is tricuspid. There is mild calcification of the aortic valve. Aortic valve regurgitation is not visualized. Aortic valve sclerosis is present, with no evidence of aortic valve stenosis.  8. The inferior vena cava is dilated in size with <50% respiratory variability, suggesting right atrial pressure of 15 mmHg. Comparison(s): Prior images reviewed side by side. No obvious valvular vegetations. FINDINGS  Left Ventricle: Left ventricular ejection fraction, by estimation, is 55 to 60%. The left ventricle has normal function. The left ventricle has no regional wall motion abnormalities. Strain imaging was not performed. The left ventricular internal cavity  size was normal in size. There is no left ventricular hypertrophy. Left ventricular diastolic function could not be evaluated due to atrial fibrillation. Left ventricular diastolic parameters are indeterminate. Right Ventricle: The right ventricular size is mildly enlarged. No increase in right ventricular wall thickness. Right ventricular systolic function is low normal. There is moderately elevated pulmonary artery systolic pressure. The tricuspid regurgitant  velocity is 2.79 m/s, and with an assumed right atrial pressure of 15 mmHg, the estimated right ventricular systolic pressure is 46.1 mmHg. Left Atrium: Left atrial  size was upper normal. Right Atrium: Right atrial size was mildly dilated. Pericardium: Ascitic fluid and left pleural effusion noted. There is no evidence of pericardial effusion. Mitral Valve: The mitral valve is grossly normal. Mild mitral valve regurgitation. Tricuspid Valve: The tricuspid valve is grossly normal. Tricuspid valve regurgitation is trivial. Aortic Valve: The aortic valve is tricuspid. There is mild calcification of the aortic valve. There is mild aortic valve annular calcification. Aortic valve regurgitation is not visualized. Aortic valve sclerosis is present, with no evidence of aortic valve stenosis. Pulmonic Valve: The pulmonic valve was grossly normal. Pulmonic valve regurgitation is trivial. Aorta: The aortic root is normal in size and structure. Venous: The inferior vena cava is dilated in size with less than 50% respiratory variability, suggesting right atrial pressure of 15 mmHg. IAS/Shunts: No atrial level shunt detected by color flow Doppler. Additional Comments: 3D imaging was not performed.  LEFT VENTRICLE PLAX 2D LVIDd:         4.60 cm   Diastology LVIDs:  2.90 cm   LV e' medial:    7.07 cm/s LV PW:         1.00 cm   LV E/e' medial:  14.4 LV IVS:        0.90 cm   LV e' lateral:   10.60 cm/s LVOT diam:     1.80 cm   LV E/e' lateral: 9.6 LV SV:         55 LV SV Index:   26 LVOT Area:     2.54 cm  RIGHT VENTRICLE            IVC RV Basal diam:  4.70 cm    IVC diam: 2.70 cm RV Mid diam:    3.80 cm RV S prime:     8.81 cm/s TAPSE (M-mode): 2.2 cm LEFT ATRIUM             Index        RIGHT ATRIUM           Index LA diam:        4.30 cm 2.03 cm/m   RA Area:     24.30 cm LA Vol (A2C):   52.6 ml 24.89 ml/m  RA Volume:   75.30 ml  35.63 ml/m LA Vol (A4C):   66.3 ml 31.37 ml/m LA Biplane Vol: 60.3 ml 28.53 ml/m  AORTIC VALVE LVOT Vmax:   83.90 cm/s LVOT Vmean:  60.500 cm/s LVOT VTI:    0.215 m  AORTA Ao Root diam: 3.30 cm MITRAL VALVE                TRICUSPID VALVE MV Area (PHT):  2.77 cm     TR Peak grad:   31.1 mmHg MV Decel Time: 274 msec     TR Vmax:        279.00 cm/s MV E velocity: 102.00 cm/s                             SHUNTS                             Systemic VTI:  0.22 m                             Systemic Diam: 1.80 cm Nona Dell MD Electronically signed by Nona Dell MD Signature Date/Time: 09/21/2023/3:46:39 PM    Final    DG Chest 1 View Result Date: 09/21/2023 CLINICAL DATA:  Right pleural effusion, status post thoracentesis EXAM: CHEST  1 VIEW COMPARISON:  09/19/2023 FINDINGS: Nearly resolved right effusion following thoracentesis. Improved right basilar aeration. Persistent low lung volumes with bibasilar atelectasis/opacities. No pneumothorax. Trachea midline. Left subclavian power port catheter tip proximal SVC as before. Aorta atherosclerotic. IMPRESSION: Nearly resolved right effusion following thoracentesis. No pneumothorax. Electronically Signed   By: Judie Petit.  Shick M.D.   On: 09/21/2023 11:07   US THORACENTESIS ASP PLEURAL SPACE W/IMG GUIDE Result Date: 09/21/2023 INDICATION: Colon cancer, shortness of breath, right pleural effusion EXAM: ULTRASOUND GUIDED RIGHT THORACENTESIS MEDICATIONS: 1% lidocaine local COMPLICATIONS: None immediate. PROCEDURE: An ultrasound guided thoracentesis was thoroughly discussed with the patient and questions answered. The benefits, risks, alternatives and complications were also discussed. The patient understands and wishes to proceed with the procedure. Written consent was obtained. Ultrasound was performed to localize and mark an adequate pocket of fluid in the  right chest. The area was then prepped and draped in the normal sterile fashion. 1% Lidocaine was used for local anesthesia. Under ultrasound guidance a 6 Fr Safe-T-Centesis catheter was introduced. Thoracentesis was performed. The catheter was removed and a dressing applied. FINDINGS: A total of approximately 2.1 L of clear pleural fluid was removed. Samples were  sent to the laboratory as requested by the clinical team. IMPRESSION: Successful ultrasound guided right thoracentesis yielding 2.1 L of pleural fluid. Electronically Signed   By: Judie Petit.  Shick M.D.   On: 09/21/2023 11:04   CT CHEST ABDOMEN PELVIS W CONTRAST Result Date: 09/19/2023 CLINICAL DATA:  Sepsis. c/o cough, congestion, fever and flu-like symptoms since Saturday EXAM: CT CHEST, ABDOMEN, AND PELVIS WITH CONTRAST TECHNIQUE: Multidetector CT imaging of the chest, abdomen and pelvis was performed following the standard protocol during bolus administration of intravenous contrast. RADIATION DOSE REDUCTION: This exam was performed according to the departmental dose-optimization program which includes automated exposure control, adjustment of the mA and/or kV according to patient size and/or use of iterative reconstruction technique. CONTRAST:  OMNIPAQUE IOHEXOL 300 MG/ML  SOLN COMPARISON:  Chest x-ray 09/19/2023, CT abdomen pelvis 04/22/2023, CT abdomen pelvis 04/04/2023, CT abdomen pelvis 10/03/2021 FINDINGS: CT CHEST FINDINGS Cardiovascular: Mildly enlarged right heart size. No significant pericardial effusion. The thoracic aorta is normal in caliber. Mild atherosclerotic plaque of the thoracic aorta. Four-vessel coronary artery calcifications. Aortic valve leaflet calcification. Mediastinum/Nodes: No enlarged mediastinal, hilar, or axillary lymph nodes. Thyroid gland, trachea, and esophagus demonstrate no significant findings. Lungs/Pleura: Bilateral lower lobe passive atelectasis. Almost complete collapse of the right lower lobe. No focal consolidation. No pulmonary nodule. No pulmonary mass. Interval increase in size of a moderate right and small left pleural effusion. No pneumothorax. Musculoskeletal: No chest wall abnormality.  Bilateral gynecomastia. No suspicious lytic or blastic osseous lesions. No acute displaced fracture. Multilevel degenerative changes of the spine. CT ABDOMEN PELVIS FINDINGS  Hepatobiliary: Nodular hepatic contour. No focal liver abnormality. Status post cholecystectomy. No biliary dilatation. Pancreas: Diffusely atrophic. No focal lesion. Otherwise normal pancreatic contour. No surrounding inflammatory changes. No main pancreatic ductal dilatation. Spleen: Normal in size without focal abnormality. Adrenals/Urinary Tract: No adrenal nodule bilaterally. Bilateral kidneys enhance symmetrically. Question mild left urothelial thickening. No hydronephrosis. No hydroureter.  No nephroureterolithiasis. The urinary bladder is unremarkable. On delayed imaging, there is no urothelial wall thickening and there are no filling defects in the opacified portions of the bilateral collecting systems or ureters. 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 Rectal surgical changes with Redemonstration of extensive presacral mesenteric fat stranding and soft tissue density. Similar orientation of several loops of the small bowel within the left anterior abdomen suggestive of possible underlying adhesions. No associated obstruction. Stomach is within normal limits. Majority of the small bowel is decompressed. No evidence of bowel wall thickening or dilatation. No pneumatosis. Appendix appears normal. Vascular/Lymphatic: Recanalized paraumbilical vein. Paraesophageal varices. The portal, splenic, superior mesenteric veins are patent. No abdominal aorta or iliac aneurysm. Mild atherosclerotic plaque of the aorta and its branches. No abdominal, pelvic, or inguinal lymphadenopathy. Reproductive: Prostate is unremarkable. Other: Grossly stable small volume simple free fluid ascites. Associated diffuse mild mesenteric edema. No intraperitoneal free gas. No organized fluid collection. Musculoskeletal: No abdominal wall hernia or abnormality. No suspicious lytic or blastic osseous lesions. No acute displaced fracture. Multilevel degenerative changes of the spine. IMPRESSION: 1. Interval  increase in size of a moderate right and small left pleural effusion. Almost complete collapse of the right  lower lobe. 2. Mild right cardiomegaly. 3. Question mild left urothelial thickening. Correlate with urinalysis for infection. 4. Cirrhosis with portal hypertension. No focal liver lesions identified. Please note that liver protocol enhanced MR and CT are the most sensitive tests for the screening detection of hepatocellular carcinoma in the high risk setting of cirrhosis. 5. Grossly stable small volume simple free fluid ascites. 6. 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 Rectal surgical changes with Redemonstration of extensive presacral mesenteric fat stranding and soft tissue density. Markedly limited evaluation of this region. 7. Aortic Atherosclerosis (ICD10-I70.0) including four-vessel coronary artery and aortic valve leaflet calcifications-correlate for aortic stenosis. Electronically Signed   By: Tish Frederickson M.D.   On: 09/19/2023 19:39   DG Chest 2 View Result Date: 09/19/2023 CLINICAL DATA:  Cough EXAM: CHEST - 2 VIEW COMPARISON:  06/08/2023 FINDINGS: Underinflation. Enlarged cardiopericardial silhouette. Central vascular prominence. Developing bilateral small pleural effusion with adjacent opacity, right-greater-than-left. Recommend follow-up. No pneumothorax. Left upper chest subclavian port. Tip overlying the central SVC. Degenerative changes of the spine. IMPRESSION: Developing pleural effusions with adjacent opacity, right-greater-than-left. Recommend follow-up. Persistent enlarged cardiac silhouette with vascular congestion. Chest port Electronically Signed   By: Karen Kays M.D.   On: 09/19/2023 17:39    Scheduled Meds:  carvedilol  3.125 mg Oral BID WC   feeding supplement (GLUCERNA SHAKE)  237 mL Oral TID BM   levothyroxine  50 mcg Oral Q0600   Continuous Infusions:  vancomycin 1,500 mg (09/21/23 1108)    LOS: 2 days   Time spent: 58 mins  Wells Gerdeman Laural Benes,  MD How to contact the [facility] Attending or Consulting provider 7A - 7P or covering provider during after hours 7P -7A, for this patient?  Check the care team in [facility] and look for a) attending/consulting [facility] provider listed and b) the [facility] team listed Log into [email or web address] to find provider on call.  Locate the [facility] provider you are looking for under [facility] and page to a number that you can be directly reached. If you still have difficulty reaching the provider, please page the [facility] (Director on Call) for the Hospitalists listed on amion for assistance.  09/21/2023, 3:55 PM

## 2023-09-21 NOTE — Progress Notes (Signed)
Patient tolerated right sided thoracentesis procedure well today and 2.2 Liters of clear fluid removed with labs collected and sent for processing. Patient verbalized understanding of post procedure instructions and transported via stretcher to xray department at this time for post chest xray with no acute distress noted.

## 2023-09-21 NOTE — Plan of Care (Signed)
Problem: Activity: Goal: Risk for activity intolerance will decrease Outcome: Progressing   Problem: Coping: Goal: Level of anxiety will decrease Outcome: Progressing   Problem: Pain Managment: Goal: General experience of comfort will improve and/or be controlled Outcome: Progressing

## 2023-09-22 DIAGNOSIS — B958 Unspecified staphylococcus as the cause of diseases classified elsewhere: Secondary | ICD-10-CM | POA: Diagnosis not present

## 2023-09-22 DIAGNOSIS — R509 Fever, unspecified: Secondary | ICD-10-CM | POA: Diagnosis not present

## 2023-09-22 DIAGNOSIS — R7881 Bacteremia: Secondary | ICD-10-CM | POA: Diagnosis not present

## 2023-09-22 LAB — CULTURE, BLOOD (ROUTINE X 2): Special Requests: ADEQUATE

## 2023-09-22 LAB — CBC WITH DIFFERENTIAL/PLATELET
Abs Immature Granulocytes: 0.03 10*3/uL (ref 0.00–0.07)
Basophils Absolute: 0.1 10*3/uL (ref 0.0–0.1)
Basophils Relative: 1 %
Eosinophils Absolute: 0.3 10*3/uL (ref 0.0–0.5)
Eosinophils Relative: 6 %
HCT: 27.7 % — ABNORMAL LOW (ref 39.0–52.0)
Hemoglobin: 9.5 g/dL — ABNORMAL LOW (ref 13.0–17.0)
Immature Granulocytes: 1 %
Lymphocytes Relative: 24 %
Lymphs Abs: 1.4 10*3/uL (ref 0.7–4.0)
MCH: 37.4 pg — ABNORMAL HIGH (ref 26.0–34.0)
MCHC: 34.3 g/dL (ref 30.0–36.0)
MCV: 109.1 fL — ABNORMAL HIGH (ref 80.0–100.0)
Monocytes Absolute: 0.8 10*3/uL (ref 0.1–1.0)
Monocytes Relative: 14 %
Neutro Abs: 3.1 10*3/uL (ref 1.7–7.7)
Neutrophils Relative %: 54 %
Platelets: 56 10*3/uL — ABNORMAL LOW (ref 150–400)
RBC: 2.54 MIL/uL — ABNORMAL LOW (ref 4.22–5.81)
RDW: 14.7 % (ref 11.5–15.5)
WBC: 5.6 10*3/uL (ref 4.0–10.5)
nRBC: 0 % (ref 0.0–0.2)

## 2023-09-22 LAB — BASIC METABOLIC PANEL
Anion gap: 3 — ABNORMAL LOW (ref 5–15)
BUN: 25 mg/dL — ABNORMAL HIGH (ref 8–23)
CO2: 24 mmol/L (ref 22–32)
Calcium: 8 mg/dL — ABNORMAL LOW (ref 8.9–10.3)
Chloride: 102 mmol/L (ref 98–111)
Creatinine, Ser: 1.2 mg/dL (ref 0.61–1.24)
GFR, Estimated: >60 mL/min (ref >60)
Glucose, Bld: 132 mg/dL — ABNORMAL HIGH (ref 70–99)
Potassium: 4.2 mmol/L (ref 3.5–5.1)
Sodium: 129 mmol/L — ABNORMAL LOW (ref 135–145)

## 2023-09-22 LAB — MAGNESIUM: Magnesium: 1.9 mg/dL (ref 1.7–2.4)

## 2023-09-22 MED ORDER — LOPERAMIDE HCL 2 MG PO CAPS
4.0000 mg | ORAL_CAPSULE | Freq: Once | ORAL | Status: AC
  Administered 2023-09-22: 4 mg via ORAL
  Filled 2023-09-22: qty 2

## 2023-09-22 MED ORDER — TAMSULOSIN HCL 0.4 MG PO CAPS
0.4000 mg | ORAL_CAPSULE | Freq: Every day | ORAL | Status: DC
  Administered 2023-09-22 – 2023-09-25 (×4): 0.4 mg via ORAL
  Filled 2023-09-22 (×4): qty 1

## 2023-09-22 MED ORDER — POTASSIUM CHLORIDE CRYS ER 20 MEQ PO TBCR
10.0000 meq | EXTENDED_RELEASE_TABLET | Freq: Every day | ORAL | Status: DC
  Administered 2023-09-22 – 2023-09-26 (×5): 10 meq via ORAL
  Filled 2023-09-22 (×5): qty 1

## 2023-09-22 MED ORDER — FUROSEMIDE 40 MG PO TABS
40.0000 mg | ORAL_TABLET | Freq: Every day | ORAL | Status: DC
  Administered 2023-09-22 – 2023-09-26 (×5): 40 mg via ORAL
  Filled 2023-09-22 (×5): qty 1

## 2023-09-22 NOTE — Progress Notes (Signed)
    [facility] has been requested to perform a transesophageal echocardiogram on Jose Lawrence for bacteremia.   He did have an EGD in 2024 which showed grade 1 esophageal varices and portal hypertensive gastropathy but no reports of esophageal stricture. 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 Na+ low at 129 but K+ at 4.2.   After careful review of history and examination, the risks and benefits of transesophageal echocardiogram have been explained including risks of esophageal damage, perforation (1:10,000 risk), bleeding, pharyngeal hematoma as well as other potential complications associated with conscious sedation including aspiration, arrhythmia, respiratory failure and death. Alternatives to treatment were discussed, questions were answered. Patient is willing to proceed.   TEE scheduled for 09/23/2023 at 1300 with Dr. Jenene Slicker.   Signed, Ellsworth Lennox, PA-C  09/22/2023 11:22 AM

## 2023-09-22 NOTE — Progress Notes (Signed)
PROGRESS NOTE   Jose Lawrence  ZOX:[id number] DOB: May 16, 1941 DOA: 09/19/2023 PCP: Ignatius Specking, MD   Chief Complaint  Patient presents with   Influenza   Level of care: Med-Surg  Brief Admission History:  83 y.o. 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  He complained of fever of 102.42F yesterday.  Patient was admitted from 06/08/2023 to 06/10/2023 due to sepsis due to bacteremia with isolation of Streptococcus agalactiae from blood culture and patient was treated with IV Rocephin initially and discharged on amoxicillin 1 g 3 times daily for additional 8 days to complete 10 days of therapy.  He also had ESBL UTI with hematuria at that time.   ED Course:  In the emergency department, BP was 171/70, other vital signs are within normal range.  Workup in the ED showed WBC 11.6, hemoglobin 9.0, hematocrit 33.6, MCV 111.3.  BMP was normal except for sodium of 132, bicarb 21, blood glucose 119, albumin 2.5, AST 63, ALT 37, ALP 99, total bilirubin 5.8.  Lactic acid 3.1 > 3.4, urinalysis was normal.  Influenza A, B, SARS-CoV-2, RSV was negative.  CT chest, abdomen and pelvis with contrast showed interval increase in size of a moderate right and small left pleural effusion. Almost complete collapse of the right lower lobe.  Cirrhosis with portal hypertension. No focal liver lesions identified.  Chest x-ray showed developing pleural effusions with adjacent opacity, right greater than left.  Patient was empirically started on IV cefepime, metronidazole and vancomycin.  Zofran was given and IV hydration per sepsis protocol was provided Hospitalist was asked to admit patient for further evaluation and management.    Assessment and Plan:  Staph lugdunensis Bacteremia  -ID consult appreciated, no vegetations seen on TTE, will ask cardiology to consider TEE on 2/13 -TEE tentatively planned for 2/14  -Continue vancomycin per ID -repeat BCs ordered 2/12 per ID no growth to date    Lactic acidosis - resolved   Leukocytosis -continue supportive measures     Pleural effusion Right - s/p thoracentesis 09/21/23 CT chest showed mod R pleural effusion in the setting of his cirrhosis. Patient reports he has been symptomatic from this for about 2 weeks now. IR consulted on admission.  -US thoracentesis completed 2/12 with 2.2 L fluid removed  -f/u pleural fluid studies    Non alcoholic cirrhosis  Coagulopathy -stable, follow    Mildly elevated AST and tbili 4. INR 2.5. MELDNa 28. Has had extensive work up, etiology remains unclear. No encephalopathy or significant ascites.  -Holding diuretic for now due to N/V   Hyponatremia  Likely multifactorial. Hypovolemia from N/V and cirrhosis  Continue to monitor with continued PO intake.    Macrocytic anemia  Chronic thrombocytopenia  In the setting of his cirrhosis.  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 Ellin Saba   Acquired hypothyroidism  Continue synthroid    Atrial fibrillation, chronic Continue Coreg for rate control, patient not on anticoagulation possibly because of persistent thrombocytopenia and anemia    Controlled type 2 diabetes mellitus without complication  Diet controlled at this time  DVT prophylaxis: SCDs Code Status: Full  Family Communication: wife  bedside 2/12 Disposition: anticipate home in 1-2 days  Consultants:  ID Cardiology for TEE (pending)  Procedures:  US thoracentesis 2/12: 2.2L fluid removed  TEE planned for 2/14   Antimicrobials:  Vancomycin 2/10>>   Subjective: Pt reports breathing much  better after thoracentesis   Objective: Vitals:   09/21/23 1030 09/21/23 1336 09/21/23 2046 09/22/23 0344  BP: 135/62 (!) 120/59 130/61 122/65  Pulse: (!) 51 (!) 43 (!) 46   Resp: 18 18 18 18   Temp:  97.8 F (36.6 C) 97.9 F (36.6 C) 98.1 F (36.7 C)  TempSrc:  Oral Oral   SpO2: 99% 100% 99% 96%  Weight:      Height:        Intake/Output Summary (Last 24 hours) at 09/22/2023 1223 Last data filed at 09/21/2023 1330 Gross per 24 hour  Intake 240 ml  Output --  Net 240 ml   Filed Weights   09/19/23 1528  Weight: 89 kg   Examination:  General exam: Appears calm and comfortable  Respiratory system: no increased WOB. Cardiovascular system: normal S1 & S2 heard. No JVD, murmurs, rubs, gallops or clicks. No pedal edema. Gastrointestinal system: Abdomen is nondistended, soft and nontender. No organomegaly or masses felt. Normal bowel sounds heard. Central nervous system: Alert and oriented. No focal neurological deficits. Extremities: Symmetric 5 x 5 power. Skin: No rashes, lesions or ulcers. Psychiatry: Judgement and insight appear normal. Mood & affect appropriate.   Data Reviewed: I have personally reviewed following labs and imaging studies  CBC: Recent Labs  Lab 09/19/23 1547 09/20/23 0515 09/21/23 0512 09/22/23 0340  WBC 11.6* 10.8* 5.8 5.6  NEUTROABS 10.4*  --  3.6 3.1  HGB 11.0* 9.4* 9.5* 9.5*  HCT 33.6* 28.0* 28.1* 27.7*  MCV 111.3* 110.7* 111.1* 109.1*  PLT 60* 58* 53* 56*    Basic Metabolic Panel: Recent Labs  Lab 09/19/23 1547 09/20/23 0515 09/21/23 0512 09/22/23 0340  NA 132* 129* 130* 129*  K 4.4 4.6 4.9 4.2  CL 102 102 102 102  CO2 21* 21* 25 24  GLUCOSE 119* 137* 142* 132*  BUN 20 24* 31* 25*  CREATININE 1.11 1.19 1.33* 1.20  CALCIUM 8.6* 7.9* 8.2* 8.0*  MG  --  1.7  --  1.9  PHOS  --  3.0  --   --     CBG: No results for input(s): "GLUCAP" in the last 168 hours.  Recent Results (from the past 240 hours)  Resp panel by RT-PCR (RSV,  Flu A&B, Covid) Anterior Nasal Swab     Status: None   Collection Time: 09/19/23  2:34 PM   Specimen: Anterior Nasal Swab  Result Value Ref Range Status   SARS Coronavirus 2 by RT PCR NEGATIVE NEGATIVE Final    Comment: (NOTE) SARS-CoV-2 target nucleic acids are NOT DETECTED.  The SARS-CoV-2 RNA is generally detectable in upper respiratory specimens during the acute phase of infection. The lowest concentration of SARS-CoV-2 viral copies this assay can detect is 138 copies/mL. A negative result does not preclude SARS-Cov-2 infection and should not be used as the sole basis for treatment or other patient management decisions. A negative result may occur with  improper specimen collection/handling, submission of specimen other than nasopharyngeal swab, presence of viral mutation(s) within the areas targeted by this assay, and inadequate number of viral copies(<138 copies/mL). A negative result must be combined with clinical observations, patient history, and epidemiological information. The expected result is Negative.  Fact Sheet for Patients:  BloggerCourse.com  Fact Sheet for Healthcare Providers:  SeriousBroker.it  This test is no t yet approved or cleared by the [place] FDA and  has been authorized for detection and/or diagnosis of SARS-CoV-2 by FDA under an Emergency Use Authorization (EUA). This EUA will remain  in effect (meaning this test can be used) for the duration of the COVID-19 declaration under Section 564(b)(1) of the Act, 21 U.S.C.section 360bbb-3(b)(1), unless the authorization is terminated  or revoked sooner.       Influenza A by PCR NEGATIVE NEGATIVE Final   Influenza B by PCR NEGATIVE NEGATIVE Final    Comment: (NOTE) The Xpert Xpress SARS-CoV-2/FLU/RSV plus assay is intended as an aid in the diagnosis of influenza from Nasopharyngeal swab specimens and should not be used as a sole basis for  treatment. Nasal washings and aspirates are unacceptable for Xpert Xpress SARS-CoV-2/FLU/RSV testing.  Fact Sheet for Patients: BloggerCourse.com  Fact Sheet for Healthcare Providers: SeriousBroker.it  This test is not yet approved or cleared by the [place] FDA and has been authorized for detection and/or diagnosis of SARS-CoV-2 by FDA under an Emergency Use Authorization (EUA). This EUA will remain in effect (meaning this test can be used) for the duration of the COVID-19 declaration under Section 564(b)(1) of the Act, 21 U.S.C. section 360bbb-3(b)(1), unless the authorization is terminated or revoked.     Resp Syncytial Virus by PCR NEGATIVE NEGATIVE Final    Comment: (NOTE) Fact Sheet for Patients: BloggerCourse.com  Fact Sheet for Healthcare Providers: SeriousBroker.it  This test is not yet approved or cleared by the [place] FDA and has been authorized for detection and/or diagnosis of SARS-CoV-2 by FDA under an Emergency Use Authorization (EUA). This EUA will remain in effect (meaning this test can be used) for the duration of the COVID-19 declaration under Section 564(b)(1) of the Act, 21 U.S.C. section 360bbb-3(b)(1), unless the authorization is terminated or revoked.  Performed at [facility], [address], [place], [place] [address]   Blood culture (routine x 2)     Status: Abnormal   Collection Time: 09/19/23  4:05 PM   Specimen: BLOOD  Result Value Ref Range Status   Specimen Description   Final    BLOOD RIGHT ANTECUBITAL Performed at [facility], [address], [place], [place] [address]    Special Requests   Final    BOTTLES DRAWN AEROBIC AND ANAEROBIC Blood Culture results may not be optimal due to an inadequate volume of blood received in culture bottles Performed at [facility], [address], [place], [place] [address]    Culture  Setup  Time   Final    GRAM POSITIVE COCCI IN BOTH AEROBIC AND ANAEROBIC BOTTLES Gram Stain Report Called to,Read Back By and Verified With: [facility] WHITE AT 0919 09/20/2023 BY A. SNYDER CRITICAL RESULT CALLED TO, READ BACK BY AND VERIFIED WITH: PHARMD LORI POOLE ON 09/20/23 @ 1332 BY DRT Performed at [facility], [address], [place], [place] [address]    Culture STAPHYLOCOCCUS LUGDUNENSIS (A)  Final   Report Status 09/22/2023 FINAL  Final   Organism ID, Bacteria STAPHYLOCOCCUS LUGDUNENSIS  Final      Susceptibility   Staphylococcus lugdunensis - MIC*    CIPROFLOXACIN <=0.5 SENSITIVE Sensitive     ERYTHROMYCIN >=8 RESISTANT Resistant     GENTAMICIN <=0.5 SENSITIVE Sensitive     OXACILLIN >=4 RESISTANT Resistant     TETRACYCLINE <=1 SENSITIVE Sensitive     VANCOMYCIN 1 SENSITIVE Sensitive  TRIMETH/SULFA <=10 SENSITIVE Sensitive     CLINDAMYCIN INTERMEDIATE Intermediate     RIFAMPIN <=0.5 SENSITIVE Sensitive     Inducible Clindamycin NEGATIVE Sensitive     * STAPHYLOCOCCUS LUGDUNENSIS  Blood Culture ID Panel (Reflexed)     Status: Abnormal   Collection Time: 09/19/23  4:05 PM  Result Value Ref Range Status   Enterococcus faecalis NOT DETECTED NOT DETECTED Final   Enterococcus Faecium NOT DETECTED NOT DETECTED Final   Listeria monocytogenes NOT DETECTED NOT DETECTED Final   Staphylococcus species DETECTED (A) NOT DETECTED Final    Comment: CRITICAL RESULT CALLED TO, READ BACK BY AND VERIFIED WITH: PHARMD LORI POOLE ON 09/20/23 @ 1332 BY DRT    Staphylococcus aureus (BCID) NOT DETECTED NOT DETECTED Final   Staphylococcus epidermidis NOT DETECTED NOT DETECTED Final   Staphylococcus lugdunensis DETECTED (A) NOT DETECTED Final    Comment: Methicillin (oxacillin) resistant coagulase negative staphylococcus. Possible blood culture contaminant (unless isolated from more than one blood culture draw or clinical case suggests pathogenicity). No antibiotic treatment is indicated for blood   culture contaminants. CRITICAL RESULT CALLED TO, READ BACK BY AND VERIFIED WITH: PHARMD LORI POOLE ON 09/20/23 @ 1332 BY DRT    Streptococcus species NOT DETECTED NOT DETECTED Final   Streptococcus agalactiae NOT DETECTED NOT DETECTED Final   Streptococcus pneumoniae NOT DETECTED NOT DETECTED Final   Streptococcus pyogenes NOT DETECTED NOT DETECTED Final   A.calcoaceticus-baumannii NOT DETECTED NOT DETECTED Final   Bacteroides fragilis NOT DETECTED NOT DETECTED Final   Enterobacterales NOT DETECTED NOT DETECTED Final   Enterobacter cloacae complex NOT DETECTED NOT DETECTED Final   Escherichia coli NOT DETECTED NOT DETECTED Final   Klebsiella aerogenes NOT DETECTED NOT DETECTED Final   Klebsiella oxytoca NOT DETECTED NOT DETECTED Final   Klebsiella pneumoniae NOT DETECTED NOT DETECTED Final   Proteus species NOT DETECTED NOT DETECTED Final   Salmonella species NOT DETECTED NOT DETECTED Final   Serratia marcescens NOT DETECTED NOT DETECTED Final   Haemophilus influenzae NOT DETECTED NOT DETECTED Final   Neisseria meningitidis NOT DETECTED NOT DETECTED Final   Pseudomonas aeruginosa NOT DETECTED NOT DETECTED Final   Stenotrophomonas maltophilia NOT DETECTED NOT DETECTED Final   Candida albicans NOT DETECTED NOT DETECTED Final   Candida auris NOT DETECTED NOT DETECTED Final   Candida glabrata NOT DETECTED NOT DETECTED Final   Candida krusei NOT DETECTED NOT DETECTED Final   Candida parapsilosis NOT DETECTED NOT DETECTED Final   Candida tropicalis NOT DETECTED NOT DETECTED Final   Cryptococcus neoformans/gattii NOT DETECTED NOT DETECTED Final   Methicillin resistance mecA/C DETECTED (A) NOT DETECTED Final    Comment: CRITICAL RESULT CALLED TO, READ BACK BY AND VERIFIED WITH: PHARMD LORI POOLE ON 09/20/23 @ 1332 BY DRT Performed at [facility], [address] [place], [place] [address]   Blood culture (routine x 2)     Status: Abnormal   Collection Time: 09/19/23  4:40 PM    Specimen: BLOOD  Result Value Ref Range Status   Specimen Description   Final    BLOOD LEFT ANTECUBITAL Performed at [facility], [address], [place], [place] [address]    Special Requests   Final    BOTTLES DRAWN AEROBIC AND ANAEROBIC Blood Culture adequate volume Performed at [facility], [address], [place], [place] [address]    Culture  Setup Time   Final    GRAM POSITIVE COCCI IN BOTH AEROBIC AND ANAEROBIC BOTTLES Gram Stain Report Called to,Read  Back By and Verified With: [facility], RN AT [phone] 09/20/2023 BY Kandice Moos Performed at [facility], [address] [place], [place] [address]    Culture (A)  Final    STAPHYLOCOCCUS LUGDUNENSIS SUSCEPTIBILITIES PERFORMED ON PREVIOUS CULTURE WITHIN THE LAST 5 DAYS. Performed at [facility], [address], [place], [place] [address]    Report Status 09/22/2023 FINAL  Final  Respiratory (~20 pathogens) panel by PCR     Status: None   Collection Time: 09/20/23  6:53 AM   Specimen: Nasopharyngeal Swab; Respiratory  Result Value Ref Range Status   Adenovirus NOT DETECTED NOT DETECTED Final   Coronavirus 229E NOT DETECTED NOT DETECTED Final    Comment: (NOTE) The Coronavirus on the Respiratory Panel, DOES NOT test for the novel  Coronavirus (2019 nCoV)    Coronavirus HKU1 NOT DETECTED NOT DETECTED Final   Coronavirus NL63 NOT DETECTED NOT DETECTED Final   Coronavirus OC43 NOT DETECTED NOT DETECTED Final   Metapneumovirus NOT DETECTED NOT DETECTED Final   Rhinovirus / Enterovirus NOT DETECTED NOT DETECTED Final   Influenza A NOT DETECTED NOT DETECTED Final   Influenza B NOT DETECTED NOT DETECTED Final   Parainfluenza Virus 1 NOT DETECTED NOT DETECTED Final   Parainfluenza Virus 2 NOT DETECTED NOT DETECTED Final   Parainfluenza Virus 3 NOT DETECTED NOT DETECTED Final   Parainfluenza Virus 4 NOT DETECTED NOT DETECTED Final   Respiratory Syncytial Virus NOT DETECTED NOT DETECTED Final   Bordetella pertussis NOT  DETECTED NOT DETECTED Final   Bordetella Parapertussis NOT DETECTED NOT DETECTED Final   Chlamydophila pneumoniae NOT DETECTED NOT DETECTED Final   Mycoplasma pneumoniae NOT DETECTED NOT DETECTED Final    Comment: Performed at [facility], [address] [place], [place] [address]  Culture, blood (Routine X 2) w Reflex to ID Panel     Status: None (Preliminary result)   Collection Time: 09/20/23  3:45 PM   Specimen: Left Antecubital; Blood  Result Value Ref Range Status   Specimen Description   Final    LEFT ANTECUBITAL BOTTLES DRAWN AEROBIC AND ANAEROBIC   Special Requests Blood Culture adequate volume  Final   Culture   Final    NO GROWTH 2 DAYS Performed at [facility], [address] [place], [place] [address]    Report Status PENDING  Incomplete  Culture, blood (Routine X 2) w Reflex to ID Panel     Status: None (Preliminary result)   Collection Time: 09/20/23  3:46 PM   Specimen: Right Antecubital; Blood  Result Value Ref Range Status   Specimen Description   Final    RIGHT ANTECUBITAL BOTTLES DRAWN AEROBIC AND ANAEROBIC   Special Requests Blood Culture adequate volume  Final   Culture   Final    NO GROWTH 2 DAYS Performed at [facility], [address] [place], [place] [address]    Report Status PENDING  Incomplete  Gram stain     Status: None   Collection Time: 09/21/23 10:20 AM   Specimen: Pleura  Result Value Ref Range Status   Specimen Description PLEURAL  Final   Special Requests NONE  Final   Gram Stain   Final    WBC PRESENT, PREDOMINANTLY MONONUCLEAR NO ORGANISMS SEEN CYTOSPIN SMEAR Performed at [facility], [address], [place], [place] [address]    Report Status 09/21/2023 FINAL  Final  Culture, body fluid w Gram Stain-bottle     Status: None (Preliminary result)  Collection Time: 09/21/23 10:20 AM   Specimen: Pleura  Result Value Ref Range Status   Specimen Description PLEURAL  Final   Special Requests BOTTLES DRAWN AEROBIC AND ANAEROBIC  10 CC  Final   Culture   Final    NO GROWTH < 24 HOURS Performed at [facility], [address], [place], [place] [address]    Report Status PENDING  Incomplete     Radiology Studies: ECHOCARDIOGRAM COMPLETE Result Date: 09/21/2023    ECHOCARDIOGRAM REPORT   Patient Name:   Jose Lawrence Date of Exam: 09/21/2023 Medical Rec #:  [id number]         Height:       72.0 in Accession #:    [id number]        Weight:       196.2 lb Date of Birth:  1941/05/30         BSA:          2.113 m Patient Age:    82 years          BP:           120/59 mmHg Patient Gender: M                 HR:           41 bpm. [facility] Procedure: 2D Echo, Color Doppler and Cardiac Doppler (Both Spectral and Color            Flow Doppler were utilized during procedure). Indications:    Endocarditis [[id number]]  History:        Patient has no prior history of Echocardiogram examinations and                 Patient has prior history of Echocardiogram examinations, most                 recent 05/30/2023. Arrythmias:Atrial Fibrillation; Risk                 Factors:Hypertension and Diabetes.  Sonographer:    Webb Laws Referring Phys: [id number] [facility] SINGH IMPRESSIONS  1. Left ventricular ejection fraction, by estimation, is 55 to 60%. The left ventricle has normal function. The left ventricle has no regional wall motion abnormalities. Left ventricular diastolic parameters are indeterminate.  2. Right ventricular systolic function is low normal. The right ventricular size is mildly enlarged. There is moderately elevated pulmonary artery systolic pressure. The estimated right ventricular systolic pressure is 46.1 mmHg.  3. Left atrial size was upper normal.  4. Right atrial size was mildly dilated.  5. Ascitic fluid and left pleural effusion noted.  6. The mitral valve is grossly normal. Mild mitral valve regurgitation.  7. The aortic valve is tricuspid. There is mild calcification of the aortic valve. Aortic valve  regurgitation is not visualized. Aortic valve sclerosis is present, with no evidence of aortic valve stenosis.  8. The inferior vena cava is dilated in size with <50% respiratory variability, suggesting right atrial pressure of 15 mmHg. Comparison(s): Prior images reviewed side by side. No obvious valvular vegetations. FINDINGS  Left Ventricle: Left ventricular ejection fraction, by estimation, is 55 to 60%. The left ventricle has normal function. The left ventricle has no regional wall motion abnormalities. Strain imaging was not performed. The left ventricular internal cavity  size was normal in size. There is no left ventricular hypertrophy. Left ventricular diastolic function could not be evaluated due to atrial fibrillation. Left ventricular diastolic parameters are  indeterminate. Right Ventricle: The right ventricular size is mildly enlarged. No increase in right ventricular wall thickness. Right ventricular systolic function is low normal. There is moderately elevated pulmonary artery systolic pressure. The tricuspid regurgitant  velocity is 2.79 m/s, and with an assumed right atrial pressure of 15 mmHg, the estimated right ventricular systolic pressure is 46.1 mmHg. Left Atrium: Left atrial size was upper normal. Right Atrium: Right atrial size was mildly dilated. Pericardium: Ascitic fluid and left pleural effusion noted. There is no evidence of pericardial effusion. Mitral Valve: The mitral valve is grossly normal. Mild mitral valve regurgitation. Tricuspid Valve: The tricuspid valve is grossly normal. Tricuspid valve regurgitation is trivial. Aortic Valve: The aortic valve is tricuspid. There is mild calcification of the aortic valve. There is mild aortic valve annular calcification. Aortic valve regurgitation is not visualized. Aortic valve sclerosis is present, with no evidence of aortic valve stenosis. Pulmonic Valve: The pulmonic valve was grossly normal. Pulmonic valve regurgitation is trivial.  Aorta: The aortic root is normal in size and structure. Venous: The inferior vena cava is dilated in size with less than 50% respiratory variability, suggesting right atrial pressure of 15 mmHg. IAS/Shunts: No atrial level shunt detected by color flow Doppler. Additional Comments: 3D imaging was not performed.  LEFT VENTRICLE PLAX 2D LVIDd:         4.60 cm   Diastology LVIDs:         2.90 cm   LV e' medial:    7.07 cm/s LV PW:         1.00 cm   LV E/e' medial:  14.4 LV IVS:        0.90 cm   LV e' lateral:   10.60 cm/s LVOT diam:     1.80 cm   LV E/e' lateral: 9.6 LV SV:         55 LV SV Index:   26 LVOT Area:     2.54 cm  RIGHT VENTRICLE            IVC RV Basal diam:  4.70 cm    IVC diam: 2.70 cm RV Mid diam:    3.80 cm RV S prime:     8.81 cm/s TAPSE (M-mode): 2.2 cm LEFT ATRIUM             Index        RIGHT ATRIUM           Index LA diam:        4.30 cm 2.03 cm/m   RA Area:     24.30 cm LA Vol (A2C):   52.6 ml 24.89 ml/m  RA Volume:   75.30 ml  35.63 ml/m LA Vol (A4C):   66.3 ml 31.37 ml/m LA Biplane Vol: 60.3 ml 28.53 ml/m  AORTIC VALVE LVOT Vmax:   83.90 cm/s LVOT Vmean:  60.500 cm/s LVOT VTI:    0.215 m  AORTA Ao Root diam: 3.30 cm MITRAL VALVE                TRICUSPID VALVE MV Area (PHT): 2.77 cm     TR Peak grad:   31.1 mmHg MV Decel Time: 274 msec     TR Vmax:        279.00 cm/s MV E velocity: 102.00 cm/s                             SHUNTS  Systemic VTI:  0.22 m                             Systemic Diam: 1.80 cm Nona Dell MD Electronically signed by Nona Dell MD Signature Date/Time: 09/21/2023/3:46:39 PM    Final    DG Chest 1 View Result Date: 09/21/2023 CLINICAL DATA:  Right pleural effusion, status post thoracentesis EXAM: CHEST  1 VIEW COMPARISON:  09/19/2023 FINDINGS: Nearly resolved right effusion following thoracentesis. Improved right basilar aeration. Persistent low lung volumes with bibasilar atelectasis/opacities. No pneumothorax. Trachea midline.  Left subclavian power port catheter tip proximal SVC as before. Aorta atherosclerotic. IMPRESSION: Nearly resolved right effusion following thoracentesis. No pneumothorax. Electronically Signed   By: Judie Petit.  Shick M.D.   On: 09/21/2023 11:07   US THORACENTESIS ASP PLEURAL SPACE W/IMG GUIDE Result Date: 09/21/2023 INDICATION: Colon cancer, shortness of breath, right pleural effusion EXAM: ULTRASOUND GUIDED RIGHT THORACENTESIS MEDICATIONS: 1% lidocaine local COMPLICATIONS: None immediate. PROCEDURE: An ultrasound guided thoracentesis was thoroughly discussed with the patient and questions answered. The benefits, risks, alternatives and complications were also discussed. The patient understands and wishes to proceed with the procedure. Written consent was obtained. Ultrasound was performed to localize and mark an adequate pocket of fluid in the right chest. The area was then prepped and draped in the normal sterile fashion. 1% Lidocaine was used for local anesthesia. Under ultrasound guidance a 6 Fr Safe-T-Centesis catheter was introduced. Thoracentesis was performed. The catheter was removed and a dressing applied. FINDINGS: A total of approximately 2.1 L of clear pleural fluid was removed. Samples were sent to the laboratory as requested by the clinical team. IMPRESSION: Successful ultrasound guided right thoracentesis yielding 2.1 L of pleural fluid. Electronically Signed   By: Judie Petit.  Shick M.D.   On: 09/21/2023 11:04    Scheduled Meds:  carvedilol  3.125 mg Oral BID WC   feeding supplement (GLUCERNA SHAKE)  237 mL Oral TID BM   levothyroxine  50 mcg Oral Q0600   Continuous Infusions:  vancomycin 1,500 mg (09/22/23 0948)    LOS: 3 days   Time spent: 55 mins  Keiosha Cancro Laural Benes, MD How to contact the [facility] Attending or Consulting provider 7A - 7P or covering provider during after hours 7P -7A, for this patient?  Check the care team in [facility] and look for a) attending/consulting [facility] provider listed and b) the  [facility] team listed Log into [email or web address] to find provider on call.  Locate the [facility] provider you are looking for under [facility] and page to a number that you can be directly reached. If you still have difficulty reaching the provider, please page the [facility] (Director on Call) for the Hospitalists listed on amion for assistance.  09/22/2023, 12:23 PM

## 2023-09-22 NOTE — Plan of Care (Signed)

## 2023-09-23 ENCOUNTER — Other Ambulatory Visit: Payer: Self-pay

## 2023-09-23 ENCOUNTER — Encounter (HOSPITAL_COMMUNITY): Payer: Self-pay | Admitting: Internal Medicine

## 2023-09-23 ENCOUNTER — Encounter (INDEPENDENT_AMBULATORY_CARE_PROVIDER_SITE_OTHER): Payer: Self-pay | Admitting: *Deleted

## 2023-09-23 ENCOUNTER — Encounter (HOSPITAL_COMMUNITY)
Admission: EM | Disposition: A | Discharge status: Home Health | Payer: Self-pay | Source: Home / Self Care | Attending: Family Medicine

## 2023-09-23 ENCOUNTER — Inpatient Hospital Stay (HOSPITAL_COMMUNITY): Payer: Medicare Other | Admitting: Certified Registered"

## 2023-09-23 ENCOUNTER — Inpatient Hospital Stay (HOSPITAL_COMMUNITY): Payer: Medicare Other

## 2023-09-23 ENCOUNTER — Inpatient Hospital Stay (HOSPITAL_COMMUNITY): Payer: Self-pay | Admitting: Certified Registered"

## 2023-09-23 DIAGNOSIS — R509 Fever, unspecified: Secondary | ICD-10-CM | POA: Diagnosis not present

## 2023-09-23 DIAGNOSIS — B958 Unspecified staphylococcus as the cause of diseases classified elsewhere: Secondary | ICD-10-CM | POA: Diagnosis not present

## 2023-09-23 DIAGNOSIS — R9430 Abnormal result of cardiovascular function study, unspecified: Secondary | ICD-10-CM | POA: Diagnosis not present

## 2023-09-23 DIAGNOSIS — J90 Pleural effusion, not elsewhere classified: Secondary | ICD-10-CM | POA: Diagnosis not present

## 2023-09-23 DIAGNOSIS — I482 Chronic atrial fibrillation, unspecified: Secondary | ICD-10-CM

## 2023-09-23 DIAGNOSIS — R7881 Bacteremia: Secondary | ICD-10-CM

## 2023-09-23 DIAGNOSIS — R188 Other ascites: Secondary | ICD-10-CM | POA: Diagnosis not present

## 2023-09-23 HISTORY — PX: TEE WITHOUT CARDIOVERSION: SHX5443

## 2023-09-23 HISTORY — PX: PORT-A-CATH REMOVAL: SHX5289

## 2023-09-23 LAB — BASIC METABOLIC PANEL
Anion gap: 3 — ABNORMAL LOW (ref 5–15)
BUN: 19 mg/dL (ref 8–23)
CO2: 24 mmol/L (ref 22–32)
Calcium: 7.8 mg/dL — ABNORMAL LOW (ref 8.9–10.3)
Chloride: 104 mmol/L (ref 98–111)
Creatinine, Ser: 1.14 mg/dL (ref 0.61–1.24)
GFR, Estimated: >60 mL/min (ref >60)
Glucose, Bld: 146 mg/dL — ABNORMAL HIGH (ref 70–99)
Potassium: 4.1 mmol/L (ref 3.5–5.1)
Sodium: 131 mmol/L — ABNORMAL LOW (ref 135–145)

## 2023-09-23 LAB — CBC WITH DIFFERENTIAL/PLATELET
Abs Immature Granulocytes: 0.04 10*3/uL (ref 0.00–0.07)
Basophils Absolute: 0 10*3/uL (ref 0.0–0.1)
Basophils Relative: 1 %
Eosinophils Absolute: 0.3 10*3/uL (ref 0.0–0.5)
Eosinophils Relative: 6 %
HCT: 28.1 % — ABNORMAL LOW (ref 39.0–52.0)
Hemoglobin: 9.7 g/dL — ABNORMAL LOW (ref 13.0–17.0)
Immature Granulocytes: 1 %
Lymphocytes Relative: 26 %
Lymphs Abs: 1.2 10*3/uL (ref 0.7–4.0)
MCH: 37 pg — ABNORMAL HIGH (ref 26.0–34.0)
MCHC: 34.5 g/dL (ref 30.0–36.0)
MCV: 107.3 fL — ABNORMAL HIGH (ref 80.0–100.0)
Monocytes Absolute: 0.6 10*3/uL (ref 0.1–1.0)
Monocytes Relative: 13 %
Neutro Abs: 2.5 10*3/uL (ref 1.7–7.7)
Neutrophils Relative %: 53 %
Platelets: 56 10*3/uL — ABNORMAL LOW (ref 150–400)
RBC: 2.62 MIL/uL — ABNORMAL LOW (ref 4.22–5.81)
RDW: 14.6 % (ref 11.5–15.5)
Smear Review: DECREASED
WBC: 4.7 10*3/uL (ref 4.0–10.5)
nRBC: 0 % (ref 0.0–0.2)

## 2023-09-23 LAB — CYTOLOGY - NON PAP

## 2023-09-23 LAB — GLUCOSE, CAPILLARY: Glucose-Capillary: 87 mg/dL (ref 70–99)

## 2023-09-23 SURGERY — ECHOCARDIOGRAM, TRANSESOPHAGEAL
Anesthesia: Monitor Anesthesia Care | Site: Chest

## 2023-09-23 MED ORDER — SODIUM CHLORIDE 0.9% FLUSH: 3.0000 mL | Freq: Two times a day (BID) | INTRAVENOUS | Status: DC

## 2023-09-23 MED ORDER — PROPOFOL 500 MG/50ML IV EMUL
INTRAVENOUS | Status: DC | PRN
  Administered 2023-09-23: 100 ug/kg/min via INTRAVENOUS

## 2023-09-23 MED ORDER — CHLORHEXIDINE GLUCONATE 0.12 % MT SOLN: 15.0000 mL | Freq: Once | OROMUCOSAL | Status: DC

## 2023-09-23 MED ORDER — SODIUM CHLORIDE 0.9% FLUSH: 3.0000 mL | INTRAVENOUS | Status: DC | PRN

## 2023-09-23 MED ORDER — PHENYLEPHRINE HCL (PRESSORS) 10 MG/ML IV SOLN
INTRAVENOUS | Status: DC | PRN
  Administered 2023-09-23 (×5): 80 ug via INTRAVENOUS
  Administered 2023-09-23: 120 ug via INTRAVENOUS

## 2023-09-23 MED ORDER — LACTATED RINGERS IV SOLN: INTRAVENOUS | Status: DC

## 2023-09-23 MED ORDER — BUTAMBEN-TETRACAINE-BENZOCAINE 2-2-14 % EX AERO
INHALATION_SPRAY | CUTANEOUS | Status: AC
Start: 2023-09-23 — End: ?
  Filled 2023-09-23: qty 5

## 2023-09-23 MED ORDER — ORAL CARE MOUTH RINSE
15.0000 mL | Freq: Once | OROMUCOSAL | Status: DC
Start: 2023-09-23 — End: 2023-09-23

## 2023-09-23 MED ORDER — ORAL CARE MOUTH RINSE: 15.0000 mL | Freq: Once | OROMUCOSAL | Status: AC

## 2023-09-23 MED ORDER — GLYCOPYRROLATE PF 0.2 MG/ML IJ SOSY
PREFILLED_SYRINGE | INTRAMUSCULAR | Status: DC | PRN
  Administered 2023-09-23: .2 mg via INTRAVENOUS

## 2023-09-23 MED ORDER — PROPOFOL 10 MG/ML IV BOLUS
INTRAVENOUS | Status: DC | PRN
  Administered 2023-09-23 (×2): 25 mg via INTRAVENOUS
  Administered 2023-09-23: 15 mg via INTRAVENOUS
  Administered 2023-09-23: 50 mg via INTRAVENOUS
  Administered 2023-09-23: 10 mg via INTRAVENOUS

## 2023-09-23 MED ORDER — LIDOCAINE HCL (PF) 2 % IJ SOLN
INTRAMUSCULAR | Status: AC
  Filled 2023-09-23: qty 5

## 2023-09-23 MED ORDER — CHLORHEXIDINE GLUCONATE 0.12 % MT SOLN
15.0000 mL | Freq: Once | OROMUCOSAL | Status: AC
  Administered 2023-09-23: 15 mL via OROMUCOSAL
  Filled 2023-09-23: qty 15

## 2023-09-23 MED ORDER — LIDOCAINE HCL (PF) 1 % IJ SOLN
INTRAMUSCULAR | Status: AC
Start: 2023-09-23 — End: 2023-09-23
  Filled 2023-09-23: qty 30

## 2023-09-23 MED ORDER — DAPTOMYCIN-SODIUM CHLORIDE 700-0.9 MG/100ML-% IV SOLN
8.0000 mg/kg | Freq: Every day | INTRAVENOUS | Status: DC
  Administered 2023-09-23 – 2023-09-26 (×4): 700 mg via INTRAVENOUS
  Filled 2023-09-23 (×7): qty 100

## 2023-09-23 MED ORDER — BUTAMBEN-TETRACAINE-BENZOCAINE 2-2-14 % EX AERO
INHALATION_SPRAY | CUTANEOUS | Status: DC | PRN
Start: 2023-09-23 — End: 2023-09-23
  Administered 2023-09-23: 1 via TOPICAL

## 2023-09-23 MED ORDER — LIDOCAINE HCL (CARDIAC) PF 100 MG/5ML IV SOSY
PREFILLED_SYRINGE | INTRAVENOUS | Status: DC | PRN
  Administered 2023-09-23: 100 mg via INTRAVENOUS

## 2023-09-23 MED ORDER — LACTATED RINGERS IV SOLN: INTRAVENOUS | Status: AC

## 2023-09-23 MED ORDER — LACTATED RINGERS IV SOLN: INTRAVENOUS | Status: DC | PRN

## 2023-09-23 NOTE — Op Note (Signed)
Patient:  Jose Lawrence  DOB:  May 31, 1941  MRN:  [id number]   Preop Diagnosis: Bacteremia, need for Port-A-Cath removal  Postop Diagnosis: Same  Procedure: Port-A-Cath removal  Surgeon: Franky Macho, MD  Anes: Local  Indications: Patient is an 83 year old white male who presented with sepsis.  Infectious disease has requested removal of the Port-A-Cath.  The risks and benefits of the procedure were fully explained to the patient, who gave informed consent.  Procedure note: The patient's procedure was done in the minor procedure room.  The left upper chest was prepped and draped using usual sterile technique with ChloraPrep.  Surgical site confirmation was performed.  1% Xylocaine was used for local anesthesia.  An incision was made through the previous surgical incision site.  This was taken down to the Port-A-Cath.  The Port-A-Cath was removed in total without difficulty.  The catheter tip was sent for culture.  And bleeding was controlled using a 4-0 Vicryl interrupted suture.  The skin was reapproximated using a 4-0 Monocryl subcuticular suture.  Dermabond was applied.  Pressure was held.  No hematoma formed.  The patient tolerated the procedure well.  He will be transferred to the floor in good and stable condition.  Complications: None  EBL: Minimal  Specimen: Catheter tip for culture

## 2023-09-23 NOTE — Progress Notes (Signed)
PROGRESS NOTE   Jose Lawrence  ZOX:[id number] DOB: 1940/09/21 DOA: 09/19/2023 PCP: Ignatius Specking, MD   Chief Complaint  Patient presents with   Influenza   Level of care: Med-Surg  Brief Admission History:  83 y.o. 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  He complained of fever of 102.58F yesterday.  Patient was admitted from 06/08/2023 to 06/10/2023 due to sepsis due to bacteremia with isolation of Streptococcus agalactiae from blood culture and patient was treated with IV Rocephin initially and discharged on amoxicillin 1 g 3 times daily for additional 8 days to complete 10 days of therapy.  He also had ESBL UTI with hematuria at that time.   ED Course:  In the emergency department, BP was 171/70, other vital signs are within normal range.  Workup in the ED showed WBC 11.6, hemoglobin 9.0, hematocrit 33.6, MCV 111.3.  BMP was normal except for sodium of 132, bicarb 21, blood glucose 119, albumin 2.5, AST 63, ALT 37, ALP 99, total bilirubin 5.8.  Lactic acid 3.1 > 3.4, urinalysis was normal.  Influenza A, B, SARS-CoV-2, RSV was negative.  CT chest, abdomen and pelvis with contrast showed interval increase in size of a moderate right and small left pleural effusion. Almost complete collapse of the right lower lobe.  Cirrhosis with portal hypertension. No focal liver lesions identified.  Chest x-ray showed developing pleural effusions with adjacent opacity, right greater than left.  Patient was empirically started on IV cefepime, metronidazole and vancomycin.  Zofran was given and IV hydration per sepsis protocol was provided Hospitalist was asked to admit patient for further evaluation and management.    Assessment and Plan:  Staph lugdunensis Bacteremia  -ID consult appreciated, no vegetations seen on TTE, will ask cardiology to consider TEE on 2/13 -TEE tentatively planned for 2/14  -Continue vancomycin per ID -repeat BCs ordered 2/12 per ID no growth to date  -TEE planned for later today, pending findings, will get final ID recommendations   Lactic acidosis - resolved   Leukocytosis -continue supportive measures     Pleural effusion Right - s/p thoracentesis 09/21/23 CT chest showed mod R pleural effusion in the setting of his cirrhosis. Patient reports he has been symptomatic from this for about 2 weeks now. IR consulted on admission.  -US thoracentesis completed 2/12 with 2.2 L fluid removed  -f/u pleural fluid studies - no signs of infection, awaiting final culture   Non alcoholic cirrhosis  Coagulopathy -stable, follow    Mildly elevated AST and tbili 4. INR 2.5. MELDNa 28. Has had extensive work up, etiology remains unclear. No encephalopathy or significant ascites.  -Holding diuretic for now due to N/V   Hyponatremia  Likely multifactorial. Hypovolemia from N/V and cirrhosis  Continue to monitor with continued PO intake.    Macrocytic anemia  Chronic thrombocytopenia  In the setting of his cirrhosis.  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 Ellin Saba   Acquired hypothyroidism  Continue synthroid    Atrial fibrillation, chronic Continue Coreg for rate control, patient not on anticoagulation possibly because of persistent thrombocytopenia and anemia    Controlled type 2 diabetes mellitus  without complication  Diet controlled at this time  DVT prophylaxis: SCDs Code Status: Full  Family Communication: wife bedside 2/12 Disposition: anticipate home soon after TEE completed and pending findings  Consultants:  ID Cardiology for TEE  (pending)  Procedures:  US thoracentesis 2/12: 2.2L fluid removed  TEE planned for 2/14   Antimicrobials:  Vancomycin 2/10>>   Subjective: Says he is breathing better, eager to go home.    Objective: Vitals:   09/21/23 2046 09/22/23 0344 09/22/23 1537 09/22/23 2006  BP: 130/61 122/65 (!) 129/58 (!) 129/59  Pulse: (!) 46  69 (!) 45  Resp: 18 18  16   Temp: 97.9 F (36.6 C) 98.1 F (36.7 C) 97.8 F (36.6 C) 98.4 F (36.9 C)  TempSrc: Oral  Oral Oral  SpO2: 99% 96% 100% 98%  Weight:      Height:        Intake/Output Summary (Last 24 hours) at 09/23/2023 1242 Last data filed at 09/22/2023 1300 Gross per 24 hour  Intake 240 ml  Output --  Net 240 ml   Filed Weights   09/19/23 1528  Weight: 89 kg   Examination:  General exam: Appears calm and comfortable  Respiratory system: no increased WOB. Cardiovascular system: normal S1 & S2 heard. No JVD, murmurs, rubs, gallops or clicks. No pedal edema. Gastrointestinal system: Abdomen is nondistended, soft and nontender. No organomegaly or masses felt. Normal bowel sounds heard. Central nervous system: Alert and oriented. No focal neurological deficits. Extremities: Symmetric 5 x 5 power. Skin: No rashes, lesions or ulcers. Psychiatry: Judgement and insight appear normal. Mood & affect appropriate.   Data Reviewed: I have personally reviewed following labs and imaging studies  CBC: Recent Labs  Lab 09/19/23 1547 09/20/23 0515 09/21/23 0512 09/22/23 0340 09/23/23 0437  WBC 11.6* 10.8* 5.8 5.6 4.7  NEUTROABS 10.4*  --  3.6 3.1 2.5  HGB 11.0* 9.4* 9.5* 9.5* 9.7*  HCT 33.6* 28.0* 28.1* 27.7* 28.1*  MCV 111.3* 110.7* 111.1* 109.1* 107.3*  PLT 60* 58* 53* 56* 56*    Basic Metabolic Panel: Recent Labs  Lab 09/19/23 1547 09/20/23 0515 09/21/23 0512 09/22/23 0340 09/23/23 0437  NA 132* 129* 130* 129* 131*  K 4.4 4.6 4.9 4.2 4.1  CL 102 102 102 102 104  CO2 21* 21* 25 24 24   GLUCOSE 119* 137* 142* 132* 146*  BUN  20 24* 31* 25* 19  CREATININE 1.11 1.19 1.33* 1.20 1.14  CALCIUM 8.6* 7.9* 8.2* 8.0* 7.8*  MG  --  1.7  --  1.9  --   PHOS  --  3.0  --   --   --     CBG: No results for input(s): "GLUCAP" in the last 168 hours.  Recent Results (from the past 240 hours)  Resp panel by RT-PCR (RSV, Flu A&B, Covid) Anterior Nasal Swab     Status: None   Collection Time: 09/19/23  2:34 PM   Specimen: Anterior Nasal Swab  Result Value Ref Range Status   SARS Coronavirus 2 by RT PCR NEGATIVE NEGATIVE Final    Comment: (NOTE) SARS-CoV-2 target nucleic acids are NOT DETECTED.  The SARS-CoV-2 RNA is generally detectable in upper respiratory specimens during the acute phase of infection. The lowest concentration of SARS-CoV-2 viral copies this assay can detect is 138 copies/mL. A negative result does not preclude SARS-Cov-2 infection and should not be used as the sole basis for treatment or other patient management decisions. A negative result may occur with  improper specimen collection/handling, submission of specimen other than nasopharyngeal swab, presence of viral mutation(s) within the areas targeted by this assay, and inadequate number of viral copies(<138 copies/mL). A negative result must be combined with clinical observations, patient history, and epidemiological information. The expected result is Negative.  Fact Sheet for Patients:  BloggerCourse.com  Fact Sheet for Healthcare Providers:  SeriousBroker.it  This test is no t yet approved or cleared by the [place] FDA and  has been authorized for detection and/or diagnosis of SARS-CoV-2 by FDA under an Emergency Use Authorization (EUA). This EUA will remain  in effect (meaning this test can be used) for the duration of the COVID-19 declaration under Section 564(b)(1) of the Act, 21 U.S.C.section 360bbb-3(b)(1), unless the authorization is terminated  or revoked sooner.        Influenza A by PCR NEGATIVE NEGATIVE Final   Influenza B by PCR NEGATIVE NEGATIVE Final    Comment: (NOTE) The Xpert Xpress SARS-CoV-2/FLU/RSV plus assay is intended as an aid in the diagnosis of influenza from Nasopharyngeal swab specimens and should not be used as a sole basis for treatment. Nasal washings and aspirates are unacceptable for Xpert Xpress SARS-CoV-2/FLU/RSV testing.  Fact Sheet for Patients: BloggerCourse.com  Fact Sheet for Healthcare Providers: SeriousBroker.it  This test is not yet approved or cleared by the [place] FDA and has been authorized for detection and/or diagnosis of SARS-CoV-2 by FDA under an Emergency Use Authorization (EUA). This EUA will remain in effect (meaning this test can be used) for the duration of the COVID-19 declaration under Section 564(b)(1) of the Act, 21 U.S.C. section 360bbb-3(b)(1), unless the authorization is terminated or revoked.     Resp Syncytial Virus by PCR NEGATIVE NEGATIVE Final    Comment: (NOTE) Fact Sheet for Patients: BloggerCourse.com  Fact Sheet for Healthcare Providers: SeriousBroker.it  This test is not yet approved or cleared by the [place] FDA and has been authorized for detection and/or diagnosis of SARS-CoV-2 by FDA under an Emergency Use Authorization (EUA). This EUA will remain in effect (meaning this test can be used) for the duration of the COVID-19 declaration under Section 564(b)(1) of the Act, 21 U.S.C. section 360bbb-3(b)(1), unless the authorization is terminated or revoked.  Performed at [facility], [address] [place], [place] [address]   Blood culture (routine x 2)     Status: Abnormal   Collection Time: 09/19/23  4:05 PM   Specimen: BLOOD  Result Value Ref Range Status   Specimen Description   Final    BLOOD RIGHT ANTECUBITAL Performed at [facility], [address]  [place], [place] [address]    Special Requests   Final    BOTTLES DRAWN AEROBIC AND ANAEROBIC Blood Culture results may not be optimal due to an inadequate volume of blood received in culture bottles Performed at [facility], [address], [place], [place] [address]    Culture  Setup Time   Final    GRAM POSITIVE COCCI IN BOTH AEROBIC AND ANAEROBIC BOTTLES Gram Stain Report Called to,Read Back By and Verified With: [facility] WHITE AT 0919 09/20/2023 BY A. SNYDER CRITICAL RESULT CALLED TO, READ BACK BY AND VERIFIED WITH: PHARMD LORI POOLE ON 09/20/23 @ 1332 BY DRT Performed at [facility], [address], [place], [place] [address]    Culture STAPHYLOCOCCUS LUGDUNENSIS (A)  Final   Report Status 09/22/2023 FINAL  Final   Organism ID, Bacteria STAPHYLOCOCCUS LUGDUNENSIS  Final      Susceptibility  Staphylococcus lugdunensis - MIC*    CIPROFLOXACIN <=0.5 SENSITIVE Sensitive     ERYTHROMYCIN >=8 RESISTANT Resistant     GENTAMICIN <=0.5 SENSITIVE Sensitive     OXACILLIN >=4 RESISTANT Resistant     TETRACYCLINE <=1 SENSITIVE Sensitive     VANCOMYCIN 1 SENSITIVE Sensitive     TRIMETH/SULFA <=10 SENSITIVE Sensitive     CLINDAMYCIN INTERMEDIATE Intermediate     RIFAMPIN <=0.5 SENSITIVE Sensitive     Inducible Clindamycin NEGATIVE Sensitive     * STAPHYLOCOCCUS LUGDUNENSIS  Blood Culture ID Panel (Reflexed)     Status: Abnormal   Collection Time: 09/19/23  4:05 PM  Result Value Ref Range Status   Enterococcus faecalis NOT DETECTED NOT DETECTED Final   Enterococcus Faecium NOT DETECTED NOT DETECTED Final   Listeria monocytogenes NOT DETECTED NOT DETECTED Final   Staphylococcus species DETECTED (A) NOT DETECTED Final    Comment: CRITICAL RESULT CALLED TO, READ BACK BY AND VERIFIED WITH: PHARMD LORI POOLE ON 09/20/23 @ 1332 BY DRT    Staphylococcus aureus (BCID) NOT DETECTED NOT DETECTED Final   Staphylococcus epidermidis NOT DETECTED NOT DETECTED Final   Staphylococcus lugdunensis DETECTED  (A) NOT DETECTED Final    Comment: Methicillin (oxacillin) resistant coagulase negative staphylococcus. Possible blood culture contaminant (unless isolated from more than one blood culture draw or clinical case suggests pathogenicity). No antibiotic treatment is indicated for blood  culture contaminants. CRITICAL RESULT CALLED TO, READ BACK BY AND VERIFIED WITH: PHARMD LORI POOLE ON 09/20/23 @ 1332 BY DRT    Streptococcus species NOT DETECTED NOT DETECTED Final   Streptococcus agalactiae NOT DETECTED NOT DETECTED Final   Streptococcus pneumoniae NOT DETECTED NOT DETECTED Final   Streptococcus pyogenes NOT DETECTED NOT DETECTED Final   A.calcoaceticus-baumannii NOT DETECTED NOT DETECTED Final   Bacteroides fragilis NOT DETECTED NOT DETECTED Final   Enterobacterales NOT DETECTED NOT DETECTED Final   Enterobacter cloacae complex NOT DETECTED NOT DETECTED Final   Escherichia coli NOT DETECTED NOT DETECTED Final   Klebsiella aerogenes NOT DETECTED NOT DETECTED Final   Klebsiella oxytoca NOT DETECTED NOT DETECTED Final   Klebsiella pneumoniae NOT DETECTED NOT DETECTED Final   Proteus species NOT DETECTED NOT DETECTED Final   Salmonella species NOT DETECTED NOT DETECTED Final   Serratia marcescens NOT DETECTED NOT DETECTED Final   Haemophilus influenzae NOT DETECTED NOT DETECTED Final   Neisseria meningitidis NOT DETECTED NOT DETECTED Final   Pseudomonas aeruginosa NOT DETECTED NOT DETECTED Final   Stenotrophomonas maltophilia NOT DETECTED NOT DETECTED Final   Candida albicans NOT DETECTED NOT DETECTED Final   Candida auris NOT DETECTED NOT DETECTED Final   Candida glabrata NOT DETECTED NOT DETECTED Final   Candida krusei NOT DETECTED NOT DETECTED Final   Candida parapsilosis NOT DETECTED NOT DETECTED Final   Candida tropicalis NOT DETECTED NOT DETECTED Final   Cryptococcus neoformans/gattii NOT DETECTED NOT DETECTED Final   Methicillin resistance mecA/C DETECTED (A) NOT DETECTED Final     Comment: CRITICAL RESULT CALLED TO, READ BACK BY AND VERIFIED WITH: PHARMD LORI POOLE ON 09/20/23 @ 1332 BY DRT Performed at [facility], [address], [place], [place] [address]   Blood culture (routine x 2)     Status: Abnormal   Collection Time: 09/19/23  4:40 PM   Specimen: BLOOD  Result Value Ref Range Status   Specimen Description   Final    BLOOD LEFT ANTECUBITAL Performed at [facility], [address] [place], [place] [address]  Special Requests   Final    BOTTLES DRAWN AEROBIC AND ANAEROBIC Blood Culture adequate volume Performed at [facility], [address] [place], [place] [address]    Culture  Setup Time   Final    GRAM POSITIVE COCCI IN BOTH AEROBIC AND ANAEROBIC BOTTLES Gram Stain Report Called to,Read Back By and Verified With: MEGAN WHITE, RN AT [phone] 09/20/2023 BY Kandice Moos Performed at [facility], [address], [place], [place] [address]    Culture (A)  Final    STAPHYLOCOCCUS LUGDUNENSIS SUSCEPTIBILITIES PERFORMED ON PREVIOUS CULTURE WITHIN THE LAST 5 DAYS. Performed at [facility], [address], [place], [place] [address]    Report Status 09/22/2023 FINAL  Final  Respiratory (~20 pathogens) panel by PCR     Status: None   Collection Time: 09/20/23  6:53 AM   Specimen: Nasopharyngeal Swab; Respiratory  Result Value Ref Range Status   Adenovirus NOT DETECTED NOT DETECTED Final   Coronavirus 229E NOT DETECTED NOT DETECTED Final    Comment: (NOTE) The Coronavirus on the Respiratory Panel, DOES NOT test for the novel  Coronavirus (2019 nCoV)    Coronavirus HKU1 NOT DETECTED NOT DETECTED Final   Coronavirus NL63 NOT DETECTED NOT DETECTED Final   Coronavirus OC43 NOT DETECTED NOT DETECTED Final   Metapneumovirus NOT DETECTED NOT DETECTED Final   Rhinovirus / Enterovirus NOT DETECTED NOT DETECTED Final   Influenza A NOT DETECTED NOT DETECTED Final   Influenza B NOT DETECTED NOT DETECTED Final   Parainfluenza Virus 1 NOT DETECTED NOT  DETECTED Final   Parainfluenza Virus 2 NOT DETECTED NOT DETECTED Final   Parainfluenza Virus 3 NOT DETECTED NOT DETECTED Final   Parainfluenza Virus 4 NOT DETECTED NOT DETECTED Final   Respiratory Syncytial Virus NOT DETECTED NOT DETECTED Final   Bordetella pertussis NOT DETECTED NOT DETECTED Final   Bordetella Parapertussis NOT DETECTED NOT DETECTED Final   Chlamydophila pneumoniae NOT DETECTED NOT DETECTED Final   Mycoplasma pneumoniae NOT DETECTED NOT DETECTED Final    Comment: Performed at [facility], [address] [place], [place] [address]  Culture, blood (Routine X 2) w Reflex to ID Panel     Status: None (Preliminary result)   Collection Time: 09/20/23  3:45 PM   Specimen: Left Antecubital; Blood  Result Value Ref Range Status   Specimen Description   Final    LEFT ANTECUBITAL BOTTLES DRAWN AEROBIC AND ANAEROBIC   Special Requests Blood Culture adequate volume  Final   Culture   Final    NO GROWTH 3 DAYS Performed at [facility], [address], [place], [place] [address]    Report Status PENDING  Incomplete  Culture, blood (Routine X 2) w Reflex to ID Panel     Status: None (Preliminary result)   Collection Time: 09/20/23  3:46 PM   Specimen: Right Antecubital; Blood  Result Value Ref Range Status   Specimen Description   Final    RIGHT ANTECUBITAL BOTTLES DRAWN AEROBIC AND ANAEROBIC   Special Requests Blood Culture adequate volume  Final   Culture   Final    NO GROWTH 3 DAYS Performed at [facility], [address] [place], [place] [address]    Report Status PENDING  Incomplete  Gram stain     Status: None   Collection Time: 09/21/23 10:20 AM   Specimen: Pleura  Result Value Ref Range Status   Specimen Description PLEURAL  Final   Special Requests NONE  Final  Gram Stain   Final    WBC PRESENT, PREDOMINANTLY MONONUCLEAR NO ORGANISMS SEEN CYTOSPIN SMEAR Performed at [facility], [address], [place], [place] [address]    Report Status 09/21/2023  FINAL  Final  Culture, body fluid w Gram Stain-bottle     Status: None (Preliminary result)   Collection Time: 09/21/23 10:20 AM   Specimen: Pleura  Result Value Ref Range Status   Specimen Description PLEURAL  Final   Special Requests BOTTLES DRAWN AEROBIC AND ANAEROBIC 10 CC  Final   Culture   Final    NO GROWTH 2 DAYS Performed at [facility], [address], [place], [place] [address]    Report Status PENDING  Incomplete     Radiology Studies: ECHOCARDIOGRAM COMPLETE Result Date: 09/21/2023    ECHOCARDIOGRAM REPORT   Patient Name:   Jose Lawrence Date of Exam: 09/21/2023 Medical Rec #:  [id number]         Height:       72.0 in Accession #:    [id number]        Weight:       196.2 lb Date of Birth:  May 28, 1941         BSA:          2.113 m Patient Age:    82 years          BP:           120/59 mmHg Patient Gender: M                 HR:           41 bpm. [facility] Procedure: 2D Echo, Color Doppler and Cardiac Doppler (Both Spectral and Color            Flow Doppler were utilized during procedure). Indications:    Endocarditis [[id number]]  History:        Patient has no prior history of Echocardiogram examinations and                 Patient has prior history of Echocardiogram examinations, most                 recent 05/30/2023. Arrythmias:Atrial Fibrillation; Risk                 Factors:Hypertension and Diabetes.  Sonographer:    Webb Laws Referring Phys: [id number] [facility] SINGH IMPRESSIONS  1. Left ventricular ejection fraction, by estimation, is 55 to 60%. The left ventricle has normal function. The left ventricle has no regional wall motion abnormalities. Left ventricular diastolic parameters are indeterminate.  2. Right ventricular systolic function is low normal. The right ventricular size is mildly enlarged. There is moderately elevated pulmonary artery systolic pressure. The estimated right ventricular systolic pressure is 46.1 mmHg.  3. Left atrial size was upper  normal.  4. Right atrial size was mildly dilated.  5. Ascitic fluid and left pleural effusion noted.  6. The mitral valve is grossly normal. Mild mitral valve regurgitation.  7. The aortic valve is tricuspid. There is mild calcification of the aortic valve. Aortic valve regurgitation is not visualized. Aortic valve sclerosis is present, with no evidence of aortic valve stenosis.  8. The inferior vena cava is dilated in size with <50% respiratory variability, suggesting right atrial pressure of 15 mmHg. Comparison(s): Prior images reviewed side by side. No obvious valvular vegetations. FINDINGS  Left Ventricle: Left ventricular ejection fraction, by estimation, is 55 to 60%. The  left ventricle has normal function. The left ventricle has no regional wall motion abnormalities. Strain imaging was not performed. The left ventricular internal cavity  size was normal in size. There is no left ventricular hypertrophy. Left ventricular diastolic function could not be evaluated due to atrial fibrillation. Left ventricular diastolic parameters are indeterminate. Right Ventricle: The right ventricular size is mildly enlarged. No increase in right ventricular wall thickness. Right ventricular systolic function is low normal. There is moderately elevated pulmonary artery systolic pressure. The tricuspid regurgitant  velocity is 2.79 m/s, and with an assumed right atrial pressure of 15 mmHg, the estimated right ventricular systolic pressure is 46.1 mmHg. Left Atrium: Left atrial size was upper normal. Right Atrium: Right atrial size was mildly dilated. Pericardium: Ascitic fluid and left pleural effusion noted. There is no evidence of pericardial effusion. Mitral Valve: The mitral valve is grossly normal. Mild mitral valve regurgitation. Tricuspid Valve: The tricuspid valve is grossly normal. Tricuspid valve regurgitation is trivial. Aortic Valve: The aortic valve is tricuspid. There is mild calcification of the aortic valve.  There is mild aortic valve annular calcification. Aortic valve regurgitation is not visualized. Aortic valve sclerosis is present, with no evidence of aortic valve stenosis. Pulmonic Valve: The pulmonic valve was grossly normal. Pulmonic valve regurgitation is trivial. Aorta: The aortic root is normal in size and structure. Venous: The inferior vena cava is dilated in size with less than 50% respiratory variability, suggesting right atrial pressure of 15 mmHg. IAS/Shunts: No atrial level shunt detected by color flow Doppler. Additional Comments: 3D imaging was not performed.  LEFT VENTRICLE PLAX 2D LVIDd:         4.60 cm   Diastology LVIDs:         2.90 cm   LV e' medial:    7.07 cm/s LV PW:         1.00 cm   LV E/e' medial:  14.4 LV IVS:        0.90 cm   LV e' lateral:   10.60 cm/s LVOT diam:     1.80 cm   LV E/e' lateral: 9.6 LV SV:         55 LV SV Index:   26 LVOT Area:     2.54 cm  RIGHT VENTRICLE            IVC RV Basal diam:  4.70 cm    IVC diam: 2.70 cm RV Mid diam:    3.80 cm RV S prime:     8.81 cm/s TAPSE (M-mode): 2.2 cm LEFT ATRIUM             Index        RIGHT ATRIUM           Index LA diam:        4.30 cm 2.03 cm/m   RA Area:     24.30 cm LA Vol (A2C):   52.6 ml 24.89 ml/m  RA Volume:   75.30 ml  35.63 ml/m LA Vol (A4C):   66.3 ml 31.37 ml/m LA Biplane Vol: 60.3 ml 28.53 ml/m  AORTIC VALVE LVOT Vmax:   83.90 cm/s LVOT Vmean:  60.500 cm/s LVOT VTI:    0.215 m  AORTA Ao Root diam: 3.30 cm MITRAL VALVE                TRICUSPID VALVE MV Area (PHT): 2.77 cm     TR Peak grad:   31.1 mmHg MV Decel Time: 274 msec  TR Vmax:        279.00 cm/s MV E velocity: 102.00 cm/s                             SHUNTS                             Systemic VTI:  0.22 m                             Systemic Diam: 1.80 cm Nona Dell MD Electronically signed by Nona Dell MD Signature Date/Time: 09/21/2023/3:46:39 PM    Final     Scheduled Meds:  carvedilol  3.125 mg Oral BID WC   feeding supplement  (GLUCERNA SHAKE)  237 mL Oral TID BM   furosemide  40 mg Oral Daily   levothyroxine  50 mcg Oral Q0600   potassium chloride SA  10 mEq Oral Daily   tamsulosin  0.4 mg Oral QPC supper   Continuous Infusions:  vancomycin 1,500 mg (09/22/23 0948)    LOS: 4 days   Time spent: 50 mins  Lyan Holck Laural Benes, MD How to contact the [facility] Attending or Consulting provider 7A - 7P or covering provider during after hours 7P -7A, for this patient?  Check the care team in [facility] and look for a) attending/consulting [facility] provider listed and b) the [facility] team listed Log into [email or web address] to find provider on call.  Locate the [facility] provider you are looking for under [facility] and page to a number that you can be directly reached. If you still have difficulty reaching the provider, please page the [facility] (Director on Call) for the Hospitalists listed on amion for assistance.  09/23/2023, 12:42 PM

## 2023-09-23 NOTE — Progress Notes (Signed)
  Echocardiogram Echocardiogram Transesophageal has been performed.  [name] 09/23/2023, 3:29 PM

## 2023-09-23 NOTE — Transfer of Care (Signed)
Immediate Anesthesia Transfer of Care Note  Patient: Jose Lawrence  Procedure(s) Performed: TRANSESOPHAGEAL ECHOCARDIOGRAM (TEE)  Patient Location: PACU  Anesthesia Type:MAC  Level of Consciousness: drowsy  Airway & Oxygen Therapy: Patient Spontanous Breathing and Patient connected to nasal cannula oxygen  Post-op Assessment: Report given to RN and Post -op Vital signs reviewed and stable  Post vital signs: Reviewed and stable  Last Vitals:  Vitals Value Taken Time  BP    Temp    Pulse    Resp    SpO2      Last Pain:  Vitals:   09/23/23 1304  TempSrc: Oral  PainSc: 0-No pain         Complications: No notable events documented.

## 2023-09-23 NOTE — Anesthesia Preprocedure Evaluation (Addendum)
Anesthesia Evaluation    Airway Mallampati: II  TM Distance: >3 FB Neck ROM: Full    Dental no notable dental hx. (+) Edentulous Upper, Edentulous Lower   Pulmonary former smoker   Pulmonary exam normal breath sounds clear to auscultation       Cardiovascular Exercise Tolerance: Good hypertension, + dysrhythmias Atrial Fibrillation  Rhythm:Irregular Rate:Abnormal     Neuro/Psych HOH, hearing aids  Neuromuscular disease    GI/Hepatic ,,,(+) Hepatitis -  Endo/Other  diabetes, Type 2Hypothyroidism    Renal/GU Renal disease     Musculoskeletal  (+) Arthritis ,    Abdominal   Peds  Hematology  (+) Blood dyscrasia, anemia Thrombocytopenia (57K)   Anesthesia Other Findings Sepsis, staph bacteremia  Reproductive/Obstetrics                             Anesthesia Physical Anesthesia Plan  ASA: 2  Anesthesia Plan: General   Post-op Pain Management: Minimal or no pain anticipated   Induction: Intravenous  PONV Risk Score and Plan: 1 and Propofol infusion  Airway Management Planned: Simple Face Mask and Nasal Cannula  Additional Equipment:   Intra-op Plan:   Post-operative Plan:   Informed Consent: I have reviewed the patients History and Physical, chart, labs and discussed the procedure including the risks, benefits and alternatives for the proposed anesthesia with the patient or authorized representative who has indicated his/her understanding and acceptance.       Plan Discussed with: CRNA  Anesthesia Plan Comments:        Anesthesia Quick Evaluation

## 2023-09-23 NOTE — Progress Notes (Addendum)
RN assisted Dr Lovell Sheehan with minor procedure after TEE procedure completed by Dr Mikal Plane.  Patient was moved from PACU to the minor procedure room for port removal.  Time out performed at 1532 start time was 1534 and case ended at 1555.  Patient tolerated well.  Report called to the floor and he was transported to room 314.  5 cc of lidocaine used for numbing.

## 2023-09-23 NOTE — Progress Notes (Signed)
Pharmacy Antibiotic Note  Jose Lawrence is a 83 y.o. male admitted on 09/19/2023 with sepsis.  Pharmacy has been consulted for Vancomycin dosing for staph bacteremia.   Patient currently afebrile, wbc normal. Scr improving  Plan: Continue Vancomycin 1500 mg IV Q 24 hrs. Goal AUC 400-550. Expected AUC: 455 SCr used: 1.14 Planning TEE today 2/14 Considering levels today vs tomorrow  Height: 6' (182.9 cm) Weight: 89 kg (196 lb 3.4 oz) IBW/kg (Calculated) : 77.6  Temp (24hrs), Avg:98.1 F (36.7 C), Min:97.8 F (36.6 C), Max:98.4 F (36.9 C)  Recent Labs  Lab 09/19/23 1547 09/19/23 1605 09/19/23 1843 09/20/23 0515 09/21/23 0512 09/22/23 0340 09/23/23 0437  WBC 11.6*  --   --  10.8* 5.8 5.6 4.7  CREATININE 1.11  --   --  1.19 1.33* 1.20 1.14  LATICACIDVEN  --  3.1* 3.4* 3.2*  --   --   --     Estimated Creatinine Clearance: 56.7 mL/min (by C-G formula based on SCr of 1.14 mg/dL).    No Known Allergies  Antimicrobials this admission: Merrem 2/11>2/12 Vancomycin 2/10>> Cefepime 2/10>> 2/11 Metronidazole 2/10 x 1 dose in ED  Microbiology results: 2/10 BCX: staph lugdunensis - S to cipro, tetra, vanc, bactrim 2.10 flu, covid are negative  Thank you for allowing pharmacy to be a part of this patient's care.  Sheppard Coil PharmD., BCPS Clinical Pharmacist 09/23/2023 9:00 AM

## 2023-09-23 NOTE — CV Procedure (Signed)
   TRANSESOPHAGEAL ECHOCARDIOGRAM  NAME:  Jose Lawrence    MRN: [id number] DOB:  11-05-1940    ADMIT DATE: 09/19/2023  INDICATIONS: Staph lugdunensis bacteremia, r/o infective endocarditis  PROCEDURE:  Informed consent was obtained prior to the procedure. After a procedural time-out, the oropharynx was anesthetized and the patient was sedated by the anesthesia service. The transesophageal probe was inserted in the esophagus and stomach without difficulty and multiple views were obtained. Anesthesia was monitored by Dr. Jarold Motto.  FINDINGS No echodensity consistent with vegetation on aortic, mitral, tricuspid and pulmonic valves is noted. No evidence of infective endocarditis.   [name], MD, [facility] [place]  [facility]  3:29 PM

## 2023-09-23 NOTE — H&P (Signed)
CARDIOLOGY CONSULT NOTE    Patient ID: Jose Lawrence; [id number]; September 13, 1940   Admit date: 09/19/2023 Date of Consult: 09/23/2023  Primary Care Provider: Ignatius Specking, MD Primary Cardiologist:  Primary Electrophysiologist:     History of Present Illness:   Jose Lawrence   Admitted for Staph lugdunensis bacteremia on antibiotics. Echo showed no infective endocarditis. ID recommended TEE. Patient stable for TEE.  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 Lita Mains   PE (pulmonary embolism)    Temporarily on Eliquis 2017   Skin cancer    35 years ago   Type 2 diabetes mellitus (HCC)    Vitamin B 12 deficiency 07/10/2019    Past Surgical History:  Procedure Laterality Date   ABDOMINOPERINEAL PROCTOCOLECTOMY  2009   end colostomy    BIOPSY  08/25/2018   Procedure: BIOPSY;  Surgeon: Malissa Hippo, MD;  [facility];  Service: Endoscopy;;   BIOPSY  10/04/2021   Procedure: BIOPSY;  Surgeon: Malissa Hippo, MD;  [facility];  Service: Endoscopy;;  small, bowel   BLADDER REPAIR  08/29/2018   Procedure: BLADDER REPAIR;  Surgeon: Lucretia Roers, MD;  Location: [facility];  Service: General;;   COLECTOMY  2008   COLONOSCOPY WITH PROPOFOL N/A 08/25/2018   Procedure: COLONOSCOPY WITH PROPOFOL;  Surgeon: Malissa Hippo, MD;  [facility];  Service: Endoscopy;  Laterality: N/A;  2:20   COLOSTOMY     ESOPHAGOGASTRODUODENOSCOPY (EGD) WITH PROPOFOL N/A 05/10/2023   Procedure: ESOPHAGOGASTRODUODENOSCOPY (EGD) WITH PROPOFOL;  Surgeon: Dolores Frame, MD;  [facility];  Service: Gastroenterology;  Laterality: N/A;  11:00AM;ASA 3   EXPLORATORY LAPAROTOMY  2017    ? pneumoperitoneum of unknown etiology/ negative Ex lap   GIVENS CAPSULE STUDY  10/04/2021   Procedure: GIVENS CAPSULE STUDY;  Surgeon: Malissa Hippo, MD;  [facility];  Service: Endoscopy;;   ILEOSCOPY N/A 10/04/2021   Procedure: ILEOSCOPY THROUGH STOMA;  Surgeon: Malissa Hippo, MD;  [facility];  Service: Endoscopy;  Laterality: N/A;   ILEOSTOMY  08/29/2018   Procedure: ILEOSTOMY;  Surgeon: Lucretia Roers, MD;  Location: [facility];  Service: General;;   LAPAROSCOPIC CHOLECYSTECTOMY  2007   LYSIS OF ADHESION  08/29/2018   Procedure: LYSIS OF ADHESION;  Surgeon: Lucretia Roers, MD;  Location: [facility];  Service: General;;   PARTIAL COLECTOMY N/A 08/29/2018   Procedure: PARTIAL COLECTOMY;  Surgeon: Lucretia Roers, MD;  Location: [facility];  Service: General;  Laterality: N/A;   PORTACATH PLACEMENT Left 10/04/2018   Procedure: INSERTION PORT-A-CATH (attached catheter left subclavian);  Surgeon: Lucretia Roers, MD;  Location: [facility];  Service: General;  Laterality: Left;   SBO SURGERY  06/2009     Inpatient Medications: Scheduled Meds:  [MAR Hold] carvedilol  3.125 mg Oral BID WC   chlorhexidine  15 mL Mouth/Throat Once   Or   mouth rinse  15 mL Mouth Rinse Once   [MAR Hold] feeding supplement (GLUCERNA SHAKE)  237 mL Oral TID BM   [MAR Hold] furosemide  40 mg Oral Daily   [  MAR Hold] levothyroxine  50 mcg Oral Q0600   lidocaine (PF)       [MAR Hold] potassium chloride SA  10 mEq Oral Daily   sodium chloride flush  3-10 mL Intravenous Q12H   [MAR Hold] tamsulosin  0.4 mg Oral QPC supper   Continuous Infusions:  DAPTOmycin     lactated ringers     PRN Meds: [MAR Hold] guaiFENesin-dextromethorphan, lidocaine (PF), [MAR Hold] ondansetron **OR** [MAR Hold] ondansetron (ZOFRAN) IV, sodium chloride flush  Allergies:   No Known Allergies  Social History:   Social History   Socioeconomic History   Marital status: Married    Spouse name: Not on file    Number of children: Not on file   Years of education: Not on file   Highest education level: Not on file  Occupational History   Occupation: Holiday representative    Comment: [employer]  Tobacco Use   Smoking status: Former    Current packs/day: 0.00    Average packs/day: 1 pack/day for 27.0 years (27.0 ttl pk-yrs)    Types: Cigarettes    Start date: 01/27/1964    Quit date: 01/27/1991    Years since quitting: 32.6   Smokeless tobacco: Former    Types: Chew    Quit date: 08/10/1995  Vaping Use   Vaping status: Never Used  Substance and Sexual Activity   Alcohol use: Never    Alcohol/week: 0.0 standard drinks of alcohol   Drug use: Never   Sexual activity: Not Currently  Other Topics Concern   Not on file  Social History Narrative   Not on file   Social Drivers of Health   Financial Resource Strain: Low Risk  (09/01/2020)   Overall Financial Resource Strain (CARDIA)    Difficulty of Paying Living Expenses: Not hard at all  Food Insecurity: No Food Insecurity (09/20/2023)   Hunger Vital Sign    Worried About Running Out of Food in the Last Year: Never true    Ran Out of Food in the Last Year: Never true  Transportation Needs: No Transportation Needs (09/20/2023)   PRAPARE - Administrator, Civil Service (Medical): No    Lack of Transportation (Non-Medical): No  Physical Activity: Sufficiently Active (09/01/2020)   Exercise Vital Sign    Days of Exercise per Week: 7 days    Minutes of Exercise per Session: 30 min  Stress: No Stress Concern Present (09/01/2020)   [employer] of Occupational Health - Occupational Stress Questionnaire    Feeling of Stress : Not at all  Social Connections: Socially Integrated (09/20/2023)   Social Connection and Isolation Panel [NHANES]    Frequency of Communication with Friends and Family: More than three times a week    Frequency of Social Gatherings with Friends and Family: More than three times a week    Attends Religious Services:  More than 4 times per year    Active Member of [employer] or Organizations: Yes    Attends Engineer, structural: More than 4 times per year    Marital Status: Married  Catering manager Violence: Not At Risk (09/20/2023)   Humiliation, Afraid, Rape, and Kick questionnaire    Fear of Current or Ex-Partner: No    Emotionally Abused: No    Physically Abused: No    Sexually Abused: No    Family History:    Family History  Problem Relation Age of Onset   Heart disease Father    Heart failure Father  Heart attack Father    Prostate cancer Paternal Uncle      ROS:  Please see the history of present illness.  ROS  All other ROS reviewed and negative.     Physical Exam/Data:   Vitals:   09/22/23 2006 09/23/23 1304 09/23/23 1456 09/23/23 1520  BP: (!) 129/59 (!) 140/64 (!) 114/50 121/64  Pulse: (!) 45 (!) 46 88 75  Resp: 16 14 17 18   Temp: 98.4 F (36.9 C) 98.1 F (36.7 C) 98.1 F (36.7 C) 97.7 F (36.5 C)  TempSrc: Oral Oral    SpO2: 98% 98% 100% 94%  Weight:  89 kg    Height:  6' (1.829 m)      Intake/Output Summary (Last 24 hours) at 09/23/2023 1525 Last data filed at 09/23/2023 1455 Gross per 24 hour  Intake 798.91 ml  Output --  Net 798.91 ml   Filed Weights   09/19/23 1528 09/23/23 1304  Weight: 89 kg 89 kg   Body mass index is 26.61 kg/m.  General:  Well nourished, well developed, in no acute distress HEENT: normal Lymph: no adenopathy Neck: no JVD Endocrine:  No thryomegaly Vascular: No carotid bruits; FA pulses 2+ bilaterally without bruits  Cardiac:  normal S1, S2; RRR; no murmur  Lungs:  clear to auscultation bilaterally, no wheezing, rhonchi or rales  Abd: soft, nontender, no hepatomegaly  Ext: no edema Musculoskeletal:  No deformities, BUE and BLE strength normal and equal Skin: warm and dry  Neuro:  CNs 2-12 intact, no focal abnormalities noted Psych:  Normal affect   Relevant CV Studies:   Laboratory Data:  Chemistry Recent Labs   Lab 09/21/23 0512 09/22/23 0340 09/23/23 0437  NA 130* 129* 131*  K 4.9 4.2 4.1  CL 102 102 104  CO2 25 24 24   GLUCOSE 142* 132* 146*  BUN 31* 25* 19  CREATININE 1.33* 1.20 1.14  CALCIUM 8.2* 8.0* 7.8*  GFRNONAA 54* >60 >60  ANIONGAP 3* 3* 3*    Recent Labs  Lab 09/19/23 1547 09/20/23 0515 09/21/23 0512  PROT 7.1 6.1* 5.9*  ALBUMIN 2.5* 2.1* 2.0*  AST 63* 57* 43*  ALT 37 33 26  ALKPHOS 99 83 88  BILITOT 5.8* 4.0* 2.1*   Hematology Recent Labs  Lab 09/21/23 0512 09/22/23 0340 09/23/23 0437  WBC 5.8 5.6 4.7  RBC 2.53* 2.54* 2.62*  HGB 9.5* 9.5* 9.7*  HCT 28.1* 27.7* 28.1*  MCV 111.1* 109.1* 107.3*  MCH 37.5* 37.4* 37.0*  MCHC 33.8 34.3 34.5  RDW 15.0 14.7 14.6  PLT 53* 56* 56*   Cardiac EnzymesNo results for input(s): "TROPONINI" in the last 168 hours. No results for input(s): "TROPIPOC" in the last 168 hours.  BNPNo results for input(s): "BNP", "PROBNP" in the last 168 hours.  DDimer No results for input(s): "DDIMER" in the last 168 hours.   Assessment and Plan:   Staph lugdunensis bacteremia, r/o infective endocarditis  Procedure, risks and benefits of the procedure are explained to the patient. Patient comprehended these risks and agreed to proceed with the procedure.   For questions or updates, please contact [facility] Please consult [email or web address] for contact info under Cardiology/STEMI.   Signed, Herbert Deaner, MD 09/23/2023 2:00 PM

## 2023-09-23 NOTE — Anesthesia Procedure Notes (Signed)
Procedure Name: MAC Date/Time: 09/23/2023 2:20 PM  Performed by: Cy Blamer, CRNAPre-anesthesia Checklist: Patient identified, Emergency Drugs available, Suction available, Patient being monitored and Timeout performed Oxygen Delivery Method: Nasal cannula Induction Type: IV induction Placement Confirmation: positive ETCO2 Dental Injury: Teeth and Oropharynx as per pre-operative assessment

## 2023-09-23 NOTE — Anesthesia Postprocedure Evaluation (Signed)
Anesthesia Post Note  Patient: Jose Lawrence  Procedure(s) Performed: TRANSESOPHAGEAL ECHOCARDIOGRAM (TEE)  Patient location during evaluation: PACU Anesthesia Type: MAC Level of consciousness: awake and alert Pain management: pain level controlled Vital Signs Assessment: post-procedure vital signs reviewed and stable Respiratory status: spontaneous breathing, nonlabored ventilation, respiratory function stable and patient connected to nasal cannula oxygen Cardiovascular status: blood pressure returned to baseline and stable Postop Assessment: no apparent nausea or vomiting Anesthetic complications: no   No notable events documented.   Last Vitals:  Vitals:   09/22/23 2006 09/23/23 1304  BP: (!) 129/59 (!) 140/64  Pulse: (!) 45 (!) 46  Resp: 16 14  Temp: 36.9 C 36.7 C  SpO2: 98% 98%    Last Pain:  Vitals:   09/23/23 1304  TempSrc: Oral  PainSc: 0-No pain                 [name]

## 2023-09-23 NOTE — Progress Notes (Signed)
ID Brief note  83 year old male with history of cirrhosis, group B strep bacteremia back in 2024 admitted with: #Staph lugdunensis bacteremia #Chest port - Blood culture from admission 2/10 grew 2/2 sets staph lugdunensis.  Chest x-ray showed developing pleural effusion with adjacent opacity right greater than left.  Patient has no respiratory symptoms - CT chest abdomen pelvis showed pleural effusion, almost complete collapse of the right lower lobe.  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 - Denies any recent procedures, no dental work, no GI procedures recently. - Right-sided thoracentesis today with 2.2 L clear fluid removed.  Cultures NG x 2 days -Suspect bacteremia seeded via port Plan: Continue daptomycin Remove chest port, repeat blood Cx after port removal  Can get PICC after port removal F/U TEE  Dr. Zenaida Niece dam is covering this weekend.  I will be back on service on Monday

## 2023-09-23 NOTE — Plan of Care (Signed)

## 2023-09-24 ENCOUNTER — Encounter (HOSPITAL_COMMUNITY): Payer: Self-pay | Admitting: Internal Medicine

## 2023-09-24 ENCOUNTER — Inpatient Hospital Stay (HOSPITAL_COMMUNITY): Payer: Medicare Other

## 2023-09-24 DIAGNOSIS — D696 Thrombocytopenia, unspecified: Secondary | ICD-10-CM | POA: Diagnosis not present

## 2023-09-24 DIAGNOSIS — R7881 Bacteremia: Secondary | ICD-10-CM | POA: Diagnosis not present

## 2023-09-24 DIAGNOSIS — B958 Unspecified staphylococcus as the cause of diseases classified elsewhere: Secondary | ICD-10-CM | POA: Diagnosis not present

## 2023-09-24 DIAGNOSIS — I482 Chronic atrial fibrillation, unspecified: Secondary | ICD-10-CM | POA: Diagnosis not present

## 2023-09-24 DIAGNOSIS — R509 Fever, unspecified: Secondary | ICD-10-CM | POA: Diagnosis not present

## 2023-09-24 LAB — CK: Total CK: 49 U/L (ref 49–397)

## 2023-09-24 LAB — CBC WITH DIFFERENTIAL/PLATELET
Abs Immature Granulocytes: 0.05 10*3/uL (ref 0.00–0.07)
Basophils Absolute: 0.1 10*3/uL (ref 0.0–0.1)
Basophils Relative: 1 %
Eosinophils Absolute: 0.3 10*3/uL (ref 0.0–0.5)
Eosinophils Relative: 5 %
HCT: 28.7 % — ABNORMAL LOW (ref 39.0–52.0)
Hemoglobin: 9.5 g/dL — ABNORMAL LOW (ref 13.0–17.0)
Immature Granulocytes: 1 %
Lymphocytes Relative: 22 %
Lymphs Abs: 1.2 10*3/uL (ref 0.7–4.0)
MCH: 36.4 pg — ABNORMAL HIGH (ref 26.0–34.0)
MCHC: 33.1 g/dL (ref 30.0–36.0)
MCV: 110 fL — ABNORMAL HIGH (ref 80.0–100.0)
Monocytes Absolute: 0.7 10*3/uL (ref 0.1–1.0)
Monocytes Relative: 14 %
Neutro Abs: 2.9 10*3/uL (ref 1.7–7.7)
Neutrophils Relative %: 57 %
Platelets: 60 10*3/uL — ABNORMAL LOW (ref 150–400)
RBC: 2.61 MIL/uL — ABNORMAL LOW (ref 4.22–5.81)
RDW: 14.7 % (ref 11.5–15.5)
WBC: 5.2 10*3/uL (ref 4.0–10.5)
nRBC: 0 % (ref 0.0–0.2)

## 2023-09-24 MED ORDER — LOPERAMIDE HCL 2 MG PO CAPS
4.0000 mg | ORAL_CAPSULE | Freq: Three times a day (TID) | ORAL | Status: DC
  Administered 2023-09-24 – 2023-09-26 (×10): 4 mg via ORAL
  Filled 2023-09-24 (×10): qty 2

## 2023-09-24 MED ORDER — SODIUM CHLORIDE 0.9% FLUSH: 10.0000 mL | INTRAVENOUS | Status: DC | PRN

## 2023-09-24 MED ORDER — CHLORHEXIDINE GLUCONATE CLOTH 2 % EX PADS
6.0000 | MEDICATED_PAD | Freq: Every day | CUTANEOUS | Status: DC
  Administered 2023-09-24 – 2023-09-26 (×3): 6 via TOPICAL

## 2023-09-24 MED ORDER — SODIUM CHLORIDE 0.9% FLUSH
10.0000 mL | Freq: Two times a day (BID) | INTRAVENOUS | Status: DC
Start: 2023-09-24 — End: 2023-09-26
  Administered 2023-09-24 – 2023-09-26 (×5): 10 mL

## 2023-09-24 NOTE — Progress Notes (Signed)
Peripherally Inserted Central Catheter Placement  The IV Nurse has discussed with the patient and/or persons authorized to consent for the patient, the purpose of this procedure and the potential benefits and risks involved with this procedure.  The benefits include less needle sticks, lab draws from the catheter, and the patient may be discharged home with the catheter. Risks include, but not limited to, infection, bleeding, blood clot (thrombus formation), and puncture of an artery; nerve damage and irregular heartbeat and possibility to perform a PICC exchange if needed/ordered by physician.  Alternatives to this procedure were also discussed.  Bard Power PICC patient education guide, fact sheet on infection prevention and patient information card has been provided to patient /or left at bedside. Telephone consent obtained from wife, verbal consent from patient. PICC inserted by Chari Manning, RN.   PICC Placement Documentation  PICC Single Lumen 09/24/23 Right Basilic 38 cm 0 cm (Active)  Indication for Insertion or Continuance of Line Home intravenous therapies (PICC only) 09/24/23 1128  Exposed Catheter (cm) 0 cm 09/24/23 1128  Site Assessment Clean, Dry, Intact 09/24/23 1128  Line Status Saline locked;Blood return noted 09/24/23 1128  Dressing Type Transparent;Securing device 09/24/23 1128  Dressing Status Antimicrobial disc/dressing in place;Clean, Dry, Intact 09/24/23 1128  Line Care Connections checked and tightened 09/24/23 1128  Line Adjustment (NICU/IV Team Only) No 09/24/23 1128  Dressing Intervention New dressing;Adhesive placed at insertion site (IV team only) 09/24/23 1128  Dressing Change Due 10/01/23 09/24/23 1128       Burnard Bunting Chenice 09/24/2023, 11:29 AM

## 2023-09-24 NOTE — TOC Progression Note (Signed)
Transition of Care [facility]) - Progression Note    Patient Details  Name: Jose Lawrence MRN: [id number] Date of Birth: [birth year]  Transition of [facility] [place]) CM/SW Contact  Elliot Gault, LCSW Phone Number: 09/24/2023, 11:47 AM  Clinical Narrative:     TOC following. Updated by MD that pt will require IV anbx at home upon dc and he is medically stable for dc when arrangements in place.   Spoke with pt and his wife to review dc planning. CMS provider options for IV therapy and HH reviewed. Referred to [facility] and [facility] as requested.   Per Pam at [facility], the IV pharmacy that serves pt's [place] address won't be able to have the medication available before Monday.   Plan now is for Pam to teach pt/wife on Monday and then pt will dc home after that. [facility] will follow up Tuesday for next dose and further teaching at home.  Updated wife and pt. Will follow.  Expected Discharge Plan: Home w Home Health Services Barriers to Discharge: Equipment Delay  Expected Discharge Plan and Services In-house Referral: Clinical Social Work Discharge Planning Services: CM Consult Post Acute Care Choice: Home Health Living arrangements for the past 2 months: Single Family Home                           HH Arranged: RN HH Agency: [facility] Date [facility] Agency Contacted: 09/24/23   Representative spoke with at [facility] Agency: Maralyn Sago   Social Determinants of Health (SDOH) Interventions SDOH Screenings   Food Insecurity: No Food Insecurity (09/20/2023)  Housing: Low Risk  (09/20/2023)  Transportation Needs: No Transportation Needs (09/20/2023)  Utilities: Not At Risk (09/20/2023)  Alcohol Screen: Low Risk  (09/01/2020)  Depression (PHQ2-9): Low Risk  (09/01/2020)  Financial Resource Strain: Low Risk  (09/01/2020)  Physical Activity: Sufficiently Active (09/01/2020)  Social Connections: Socially Integrated (09/20/2023)  Stress: No Stress Concern Present  (09/01/2020)  Tobacco Use: Medium Risk (09/23/2023)    Readmission Risk Interventions    09/20/2023    9:49 AM 06/09/2023    2:44 PM  Readmission Risk Prevention Plan  Transportation Screening Complete Complete  PCP or Specialist Appt within 3-5 Days  Not Complete  HRI or Home Care Consult Complete Complete  Social Work Consult for Recovery Care Planning/Counseling Complete Complete  Palliative Care Screening Not Applicable Not Applicable  Medication Review Oceanographer) Complete Complete

## 2023-09-24 NOTE — Plan of Care (Signed)

## 2023-09-24 NOTE — Progress Notes (Signed)
       Date: 09/24/2023  Patient name: Jose Lawrence  Medical record number: [id number]  Date of birth: 19-Aug-1940   Patient is sp port removal  Post port removal cultures NGTD  TEE not yet read   [name] 09/24/2023, 5:23 PM

## 2023-09-24 NOTE — Progress Notes (Signed)
PROGRESS NOTE   Jose Lawrence  ZOX:[id number] DOB: 1940/08/18 DOA: 09/19/2023 PCP: Ignatius Specking, MD   Chief Complaint  Patient presents with   Influenza   Level of care: Med-Surg  Brief Admission History:  83 y.o. 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  He complained of fever of 102.22F yesterday.  Patient was admitted from 06/08/2023 to 06/10/2023 due to sepsis due to bacteremia with isolation of Streptococcus agalactiae from blood culture and patient was treated with IV Rocephin initially and discharged on amoxicillin 1 g 3 times daily for additional 8 days to complete 10 days of therapy.  He also had ESBL UTI with hematuria at that time.   ED Course:  In the emergency department, BP was 171/70, other vital signs are within normal range.  Workup in the ED showed WBC 11.6, hemoglobin 9.0, hematocrit 33.6, MCV 111.3.  BMP was normal except for sodium of 132, bicarb 21, blood glucose 119, albumin 2.5, AST 63, ALT 37, ALP 99, total bilirubin 5.8.  Lactic acid 3.1 > 3.4, urinalysis was normal.  Influenza A, B, SARS-CoV-2, RSV was negative.  CT chest, abdomen and pelvis with contrast showed interval increase in size of a moderate right and small left pleural effusion. Almost complete collapse of the right lower lobe.  Cirrhosis with portal hypertension. No focal liver lesions identified.  Chest x-ray showed developing pleural effusions with adjacent opacity, right greater than left.  Patient was empirically started on IV cefepime, metronidazole and vancomycin.  Zofran was given and IV hydration per sepsis protocol was provided Hospitalist was asked to admit patient for further evaluation and management.    Assessment and Plan:  Staph lugdunensis Bacteremia  -ID consult appreciated, no vegetations seen on TTE, will ask cardiology to consider TEE on 2/13 -TEE tentatively planned for 2/14  -Continue vancomycin per ID -repeat BCs ordered 2/12 per ID no growth to date  -TEE done 2/14, no vegetations found -Port removed by Dr. Lovell Sheehan on 2/14 and blood cultures repeated after port removed -PICC line to be placed 2/15 -TOC working on home health for IV abx -per ID final recommendations continue daptomycin thru 10/03/23. -per TOC patient likely won't be able to discharge home until Mon or Tues due to home health agency ability to come and train patient and family on home IV antibiotics    Lactic acidosis - resolved   Leukocytosis -he responded favorably to supportive measures     Pleural effusion Right - s/p thoracentesis 09/21/23 CT chest showed mod R pleural effusion in the setting of his cirrhosis. Patient reports he has been symptomatic from this for about 2 weeks now. IR consulted on admission.  -US thoracentesis completed 2/12 with 2.2 L fluid removed  -f/u pleural fluid studies - no signs of infection, awaiting final culture and tests   Non alcoholic cirrhosis  Coagulopathy -stable, follow    Mildly elevated AST and tbili 4. INR 2.5. MELDNa 28. Has had extensive work up, etiology remains unclear. No encephalopathy or significant ascites.  -Holding diuretic for now due to N/V   Hyponatremia  Likely multifactorial. Hypovolemia from N/V and cirrhosis  Continue to monitor with continued PO intake.    Macrocytic anemia  Chronic thrombocytopenia  In the setting of his  cirrhosis.  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 Ellin Saba   Acquired hypothyroidism  Continue synthroid    Atrial fibrillation, chronic He has been on Coreg for rate control, patient  not on anticoagulation possibly because of persistent thrombocytopenia and anemia  He is having bradycardia and we have had to hold the coreg for a couple of days  Bradycardia -asymptomatic -holding carvedilol for now    Controlled type 2 diabetes mellitus without complication  Diet controlled at this time  DVT prophylaxis: SCDs Code Status: Full  Family Communication: wife bedside 2/12, telephone call 2/14  Disposition: anticipate home soon when home IV antibiotics can be arranged   Consultants:  ID Cardiology for TEE  Surgery for port removal   Procedures:  US thoracentesis 2/12: 2.2L fluid removed  TEE  2/14  Port removal 2/14 Dr. Lovell Sheehan   Antimicrobials:  Vancomycin 2/10>>2/14  Daptomycin 2/14>>  Subjective: He is eager to go home, he was worried about his HR being low but he is asymptomatic from this    Objective: Vitals:   09/23/23 1556 09/23/23 1557 09/23/23 2118 09/24/23 0548  BP:   136/63 (!) 109/48  Pulse: (!) 55 (!) 51 (!) 54 (!) 50  Resp: 16 15 18 18   Temp:   97.6 F (36.4 C) 97.7 F (36.5 C)  TempSrc:   Oral   SpO2: 96% 97% 99% 96%  Weight:      Height:        Intake/Output Summary (Last 24 hours) at 09/24/2023 1042 Last data filed at 09/23/2023 2120 Gross per 24 hour  Intake 1038.91 ml  Output --  Net 1038.91 ml   Filed Weights   09/19/23 1528 09/23/23 1304  Weight: 89 kg 89 kg   Examination:  General exam: Appears calm and comfortable  Respiratory system: no increased WOB. Cardiovascular system: normal S1 & S2 heard. No JVD, murmurs, rubs, gallops or clicks. No pedal edema. Gastrointestinal system: Abdomen is nondistended, soft and nontender. No organomegaly or masses felt. Normal bowel sounds heard. Central nervous system: Alert and oriented. No focal neurological deficits. Extremities: Symmetric 5 x 5 power. Skin: No rashes, lesions or ulcers. Psychiatry: Judgement and insight appear normal. Mood & affect appropriate.   Data  Reviewed: I have personally reviewed following labs and imaging studies  CBC: Recent Labs  Lab 09/19/23 1547 09/20/23 0515 09/21/23 0512 09/22/23 0340 09/23/23 0437 09/24/23 0452  WBC 11.6* 10.8* 5.8 5.6 4.7 5.2  NEUTROABS 10.4*  --  3.6 3.1 2.5 2.9  HGB 11.0* 9.4* 9.5* 9.5* 9.7* 9.5*  HCT 33.6* 28.0* 28.1* 27.7* 28.1* 28.7*  MCV 111.3* 110.7* 111.1* 109.1* 107.3* 110.0*  PLT 60* 58* 53* 56* 56* 60*    Basic Metabolic Panel: Recent Labs  Lab 09/19/23 1547 09/20/23 0515 09/21/23 0512 09/22/23 0340 09/23/23 0437  NA 132* 129* 130* 129* 131*  K 4.4 4.6 4.9 4.2 4.1  CL 102 102 102 102 104  CO2 21* 21* 25 24 24   GLUCOSE 119* 137* 142* 132* 146*  BUN 20 24* 31* 25* 19  CREATININE 1.11 1.19 1.33* 1.20 1.14  CALCIUM 8.6* 7.9* 8.2* 8.0* 7.8*  MG  --  1.7  --  1.9  --   PHOS  --  3.0  --   --   --     CBG: Recent Labs  Lab 09/23/23 1340  GLUCAP 87  Recent Results (from the past 240 hours)  Resp panel by RT-PCR (RSV, Flu A&B, Covid) Anterior Nasal Swab     Status: None   Collection Time: 09/19/23  2:34 PM   Specimen: Anterior Nasal Swab  Result Value Ref Range Status   SARS Coronavirus 2 by RT PCR NEGATIVE NEGATIVE Final    Comment: (NOTE) SARS-CoV-2 target nucleic acids are NOT DETECTED.  The SARS-CoV-2 RNA is generally detectable in upper respiratory specimens during the acute phase of infection. The lowest concentration of SARS-CoV-2 viral copies this assay can detect is 138 copies/mL. A negative result does not preclude SARS-Cov-2 infection and should not be used as the sole basis for treatment or other patient management decisions. A negative result may occur with  improper specimen collection/handling, submission of specimen other than nasopharyngeal swab, presence of viral mutation(s) within the areas targeted by this assay, and inadequate number of viral copies(<138 copies/mL). A negative result must be combined with clinical observations, patient  history, and epidemiological information. The expected result is Negative.  Fact Sheet for Patients:  BloggerCourse.com  Fact Sheet for Healthcare Providers:  SeriousBroker.it  This test is no t yet approved or cleared by the [place] FDA and  has been authorized for detection and/or diagnosis of SARS-CoV-2 by FDA under an Emergency Use Authorization (EUA). This EUA will remain  in effect (meaning this test can be used) for the duration of the COVID-19 declaration under Section 564(b)(1) of the Act, 21 U.S.C.section 360bbb-3(b)(1), unless the authorization is terminated  or revoked sooner.       Influenza A by PCR NEGATIVE NEGATIVE Final   Influenza B by PCR NEGATIVE NEGATIVE Final    Comment: (NOTE) The Xpert Xpress SARS-CoV-2/FLU/RSV plus assay is intended as an aid in the diagnosis of influenza from Nasopharyngeal swab specimens and should not be used as a sole basis for treatment. Nasal washings and aspirates are unacceptable for Xpert Xpress SARS-CoV-2/FLU/RSV testing.  Fact Sheet for Patients: BloggerCourse.com  Fact Sheet for Healthcare Providers: SeriousBroker.it  This test is not yet approved or cleared by the [place] FDA and has been authorized for detection and/or diagnosis of SARS-CoV-2 by FDA under an Emergency Use Authorization (EUA). This EUA will remain in effect (meaning this test can be used) for the duration of the COVID-19 declaration under Section 564(b)(1) of the Act, 21 U.S.C. section 360bbb-3(b)(1), unless the authorization is terminated or revoked.     Resp Syncytial Virus by PCR NEGATIVE NEGATIVE Final    Comment: (NOTE) Fact Sheet for Patients: BloggerCourse.com  Fact Sheet for Healthcare Providers: SeriousBroker.it  This test is not yet approved or cleared by the [place] FDA  and has been authorized for detection and/or diagnosis of SARS-CoV-2 by FDA under an Emergency Use Authorization (EUA). This EUA will remain in effect (meaning this test can be used) for the duration of the COVID-19 declaration under Section 564(b)(1) of the Act, 21 U.S.C. section 360bbb-3(b)(1), unless the authorization is terminated or revoked.  Performed at [facility], [address], [place], [place] [address]   Blood culture (routine x 2)     Status: Abnormal   Collection Time: 09/19/23  4:05 PM   Specimen: BLOOD  Result Value Ref Range Status   Specimen Description   Final    BLOOD RIGHT ANTECUBITAL Performed at [facility], [address], [place], [place] [address]    Special Requests   Final    BOTTLES DRAWN AEROBIC AND ANAEROBIC Blood Culture results may  not be optimal due to an inadequate volume of blood received in culture bottles Performed at [facility], [address] [place], [place] [address]    Culture  Setup Time   Final    GRAM POSITIVE COCCI IN BOTH AEROBIC AND ANAEROBIC BOTTLES Gram Stain Report Called to,Read Back By and Verified With: [facility] WHITE AT 0919 09/20/2023 BY A. SNYDER CRITICAL RESULT CALLED TO, READ BACK BY AND VERIFIED WITH: PHARMD LORI POOLE ON 09/20/23 @ 1332 BY DRT Performed at [facility], [address] [place], [place] [address]    Culture STAPHYLOCOCCUS LUGDUNENSIS (A)  Final   Report Status 09/22/2023 FINAL  Final   Organism ID, Bacteria STAPHYLOCOCCUS LUGDUNENSIS  Final      Susceptibility   Staphylococcus lugdunensis - MIC*    CIPROFLOXACIN <=0.5 SENSITIVE Sensitive     ERYTHROMYCIN >=8 RESISTANT Resistant     GENTAMICIN <=0.5 SENSITIVE Sensitive     OXACILLIN >=4 RESISTANT Resistant     TETRACYCLINE <=1 SENSITIVE Sensitive     VANCOMYCIN 1 SENSITIVE Sensitive     TRIMETH/SULFA <=10 SENSITIVE Sensitive     CLINDAMYCIN INTERMEDIATE Intermediate     RIFAMPIN <=0.5 SENSITIVE Sensitive     Inducible Clindamycin NEGATIVE  Sensitive     * STAPHYLOCOCCUS LUGDUNENSIS  Blood Culture ID Panel (Reflexed)     Status: Abnormal   Collection Time: 09/19/23  4:05 PM  Result Value Ref Range Status   Enterococcus faecalis NOT DETECTED NOT DETECTED Final   Enterococcus Faecium NOT DETECTED NOT DETECTED Final   Listeria monocytogenes NOT DETECTED NOT DETECTED Final   Staphylococcus species DETECTED (A) NOT DETECTED Final    Comment: CRITICAL RESULT CALLED TO, READ BACK BY AND VERIFIED WITH: PHARMD LORI POOLE ON 09/20/23 @ 1332 BY DRT    Staphylococcus aureus (BCID) NOT DETECTED NOT DETECTED Final   Staphylococcus epidermidis NOT DETECTED NOT DETECTED Final   Staphylococcus lugdunensis DETECTED (A) NOT DETECTED Final    Comment: Methicillin (oxacillin) resistant coagulase negative staphylococcus. Possible blood culture contaminant (unless isolated from more than one blood culture draw or clinical case suggests pathogenicity). No antibiotic treatment is indicated for blood  culture contaminants. CRITICAL RESULT CALLED TO, READ BACK BY AND VERIFIED WITH: PHARMD LORI POOLE ON 09/20/23 @ 1332 BY DRT    Streptococcus species NOT DETECTED NOT DETECTED Final   Streptococcus agalactiae NOT DETECTED NOT DETECTED Final   Streptococcus pneumoniae NOT DETECTED NOT DETECTED Final   Streptococcus pyogenes NOT DETECTED NOT DETECTED Final   A.calcoaceticus-baumannii NOT DETECTED NOT DETECTED Final   Bacteroides fragilis NOT DETECTED NOT DETECTED Final   Enterobacterales NOT DETECTED NOT DETECTED Final   Enterobacter cloacae complex NOT DETECTED NOT DETECTED Final   Escherichia coli NOT DETECTED NOT DETECTED Final   Klebsiella aerogenes NOT DETECTED NOT DETECTED Final   Klebsiella oxytoca NOT DETECTED NOT DETECTED Final   Klebsiella pneumoniae NOT DETECTED NOT DETECTED Final   Proteus species NOT DETECTED NOT DETECTED Final   Salmonella species NOT DETECTED NOT DETECTED Final   Serratia marcescens NOT DETECTED NOT DETECTED Final    Haemophilus influenzae NOT DETECTED NOT DETECTED Final   Neisseria meningitidis NOT DETECTED NOT DETECTED Final   Pseudomonas aeruginosa NOT DETECTED NOT DETECTED Final   Stenotrophomonas maltophilia NOT DETECTED NOT DETECTED Final   Candida albicans NOT DETECTED NOT DETECTED Final   Candida auris NOT DETECTED NOT DETECTED Final   Candida glabrata NOT DETECTED NOT DETECTED Final   Candida krusei NOT DETECTED NOT DETECTED Final  Candida parapsilosis NOT DETECTED NOT DETECTED Final   Candida tropicalis NOT DETECTED NOT DETECTED Final   Cryptococcus neoformans/gattii NOT DETECTED NOT DETECTED Final   Methicillin resistance mecA/C DETECTED (A) NOT DETECTED Final    Comment: CRITICAL RESULT CALLED TO, READ BACK BY AND VERIFIED WITH: PHARMD LORI POOLE ON 09/20/23 @ 1332 BY DRT Performed at [facility], [address] [place], [place] [address]   Blood culture (routine x 2)     Status: Abnormal   Collection Time: 09/19/23  4:40 PM   Specimen: BLOOD  Result Value Ref Range Status   Specimen Description   Final    BLOOD LEFT ANTECUBITAL Performed at [facility], [address] [place], [place] [address]    Special Requests   Final    BOTTLES DRAWN AEROBIC AND ANAEROBIC Blood Culture adequate volume Performed at [facility], [address], [place], [place] [address]    Culture  Setup Time   Final    GRAM POSITIVE COCCI IN BOTH AEROBIC AND ANAEROBIC BOTTLES Gram Stain Report Called to,Read Back By and Verified With: MEGAN WHITE, RN AT [phone] 09/20/2023 BY Kandice Moos Performed at [facility], [address] [place], [place] [address]    Culture (A)  Final    STAPHYLOCOCCUS LUGDUNENSIS SUSCEPTIBILITIES PERFORMED ON PREVIOUS CULTURE WITHIN THE LAST 5 DAYS. Performed at [facility], [address] [place], [place] [address]    Report Status 09/22/2023 FINAL  Final  Respiratory (~20 pathogens) panel by PCR     Status: None   Collection Time: 09/20/23  6:53 AM   Specimen:  Nasopharyngeal Swab; Respiratory  Result Value Ref Range Status   Adenovirus NOT DETECTED NOT DETECTED Final   Coronavirus 229E NOT DETECTED NOT DETECTED Final    Comment: (NOTE) The Coronavirus on the Respiratory Panel, DOES NOT test for the novel  Coronavirus (2019 nCoV)    Coronavirus HKU1 NOT DETECTED NOT DETECTED Final   Coronavirus NL63 NOT DETECTED NOT DETECTED Final   Coronavirus OC43 NOT DETECTED NOT DETECTED Final   Metapneumovirus NOT DETECTED NOT DETECTED Final   Rhinovirus / Enterovirus NOT DETECTED NOT DETECTED Final   Influenza A NOT DETECTED NOT DETECTED Final   Influenza B NOT DETECTED NOT DETECTED Final   Parainfluenza Virus 1 NOT DETECTED NOT DETECTED Final   Parainfluenza Virus 2 NOT DETECTED NOT DETECTED Final   Parainfluenza Virus 3 NOT DETECTED NOT DETECTED Final   Parainfluenza Virus 4 NOT DETECTED NOT DETECTED Final   Respiratory Syncytial Virus NOT DETECTED NOT DETECTED Final   Bordetella pertussis NOT DETECTED NOT DETECTED Final   Bordetella Parapertussis NOT DETECTED NOT DETECTED Final   Chlamydophila pneumoniae NOT DETECTED NOT DETECTED Final   Mycoplasma pneumoniae NOT DETECTED NOT DETECTED Final    Comment: Performed at [facility], [address], [place], [place] [address]  Culture, blood (Routine X 2) w Reflex to ID Panel     Status: None (Preliminary result)   Collection Time: 09/20/23  3:45 PM   Specimen: Left Antecubital; Blood  Result Value Ref Range Status   Specimen Description   Final    LEFT ANTECUBITAL BOTTLES DRAWN AEROBIC AND ANAEROBIC   Special Requests Blood Culture adequate volume  Final   Culture   Final    NO GROWTH 4 DAYS Performed at [facility], [address], [place], [place] [address]    Report Status PENDING  Incomplete  Culture, blood (Routine X 2) w Reflex to ID  Panel     Status: None (Preliminary result)   Collection Time: 09/20/23  3:46 PM   Specimen: Right Antecubital; Blood  Result Value Ref Range Status    Specimen Description   Final    RIGHT ANTECUBITAL BOTTLES DRAWN AEROBIC AND ANAEROBIC   Special Requests Blood Culture adequate volume  Final   Culture   Final    NO GROWTH 4 DAYS Performed at [facility], [address] [place], [place] [address]    Report Status PENDING  Incomplete  Gram stain     Status: None   Collection Time: 09/21/23 10:20 AM   Specimen: Pleura  Result Value Ref Range Status   Specimen Description PLEURAL  Final   Special Requests NONE  Final   Gram Stain   Final    WBC PRESENT, PREDOMINANTLY MONONUCLEAR NO ORGANISMS SEEN CYTOSPIN SMEAR Performed at [facility], [address], [place], [place] [address]    Report Status 09/21/2023 FINAL  Final  Culture, body fluid w Gram Stain-bottle     Status: None (Preliminary result)   Collection Time: 09/21/23 10:20 AM   Specimen: Pleura  Result Value Ref Range Status   Specimen Description PLEURAL  Final   Special Requests BOTTLES DRAWN AEROBIC AND ANAEROBIC 10 CC  Final   Culture   Final    NO GROWTH 3 DAYS Performed at [facility], [address], [place], [place] [address]    Report Status PENDING  Incomplete  Cath Tip Culture     Status: None (Preliminary result)   Collection Time: 09/23/23  3:55 PM   Specimen: Catheter Tip  Result Value Ref Range Status   Specimen Description   Final    CATH TIP Performed at [facility], [address], [place], [place] [address]    Special Requests   Final    NONE Performed at [facility], [address] [place], [place] [address]    Culture   Final    NO GROWTH < 12 HOURS Performed at [facility], [address], [place], [place] [address]    Report Status PENDING  Incomplete  Culture, blood (Routine X 2) w Reflex to ID Panel     Status: None (Preliminary result)   Collection Time: 09/23/23 10:08 PM   Specimen: Right Antecubital; Blood  Result Value Ref Range Status   Specimen Description RIGHT ANTECUBITAL  Final   Special Requests   Final    BOTTLES  DRAWN AEROBIC AND ANAEROBIC Blood Culture adequate volume   Culture   Final    NO GROWTH < 12 HOURS Performed at [facility], [address], [place], [place] [address]    Report Status PENDING  Incomplete  Culture, blood (Routine X 2) w Reflex to ID Panel     Status: None (Preliminary result)   Collection Time: 09/23/23 10:09 PM   Specimen: Left Antecubital  Result Value Ref Range Status   Specimen Description LEFT ANTECUBITAL  Final   Special Requests   Final    BOTTLES DRAWN AEROBIC AND ANAEROBIC Blood Culture adequate volume   Culture   Final    NO GROWTH < 12 HOURS Performed at [facility], [address], [place], [place] [address]    Report Status PENDING  Incomplete     Radiology Studies: [place] EKG SITE RITE Result Date: 09/23/2023 If Site Rite image not attached, placement could not be confirmed due to current cardiac rhythm.   Scheduled Meds:  carvedilol  3.125 mg Oral  BID WC   feeding supplement (GLUCERNA SHAKE)  237 mL Oral TID BM   furosemide  40 mg Oral Daily   levothyroxine  50 mcg Oral Q0600   loperamide  4 mg Oral TID AC & HS   potassium chloride SA  10 mEq Oral Daily   tamsulosin  0.4 mg Oral QPC supper   Continuous Infusions:  DAPTOmycin 700 mg (09/23/23 1752)   lactated ringers      LOS: 5 days   Time spent: 42 mins  Talea Manges Laural Benes, MD How to contact the [facility] Attending or Consulting provider 7A - 7P or covering provider during after hours 7P -7A, for this patient?  Check the care team in [facility] and look for a) attending/consulting TR[facility] provider listed and b) the [facility] team listed Log into [email or web address] to find provider on call.  Locate the [facility] provider you are looking for under [facility] and page to a number that you can be directly reached. If you still have difficulty reaching the provider, please page the [facility] (Director on Call) for the Hospitalists listed on amion for assistance.  09/24/2023, 10:42 AM

## 2023-09-24 NOTE — Progress Notes (Signed)
1 Day Post-Op  Subjective: Patient has no incisional pain.  Objective: Vital signs in last 24 hours: Temp:  [97.6 F (36.4 C)-98.5 F (36.9 C)] 97.7 F (36.5 C) (02/15 0548) Pulse Rate:  [46-88] 50 (02/15 0548) Resp:  [13-24] 18 (02/15 0548) BP: (109-142)/(48-70) 109/48 (02/15 0548) SpO2:  [94 %-100 %] 96 % (02/15 0548) Weight:  [89 kg] 89 kg (02/14 1304) Last BM Date : 09/23/23  Intake/Output from previous day: 02/14 0701 - 02/15 0700 In: 1038.9 [P.O.:240; I.V.:200; IV Piggyback:598.9] Out: -  Intake/Output this shift: No intake/output data recorded.  General appearance: alert, cooperative, and no distress Skin: Former Port-A-Cath site healing well with minimal swelling.  No significant hematoma.  Lab Results:  Recent Labs    09/23/23 0437 09/24/23 0452  WBC 4.7 5.2  HGB 9.7* 9.5*  HCT 28.1* 28.7*  PLT 56* 60*   BMET Recent Labs    09/22/23 0340 09/23/23 0437  NA 129* 131*  K 4.2 4.1  CL 102 104  CO2 24 24  GLUCOSE 132* 146*  BUN 25* 19  CREATININE 1.20 1.14  CALCIUM 8.0* 7.8*   PT/INR No results for input(s): "LABPROT", "INR" in the last 72 hours.  Studies/Results: [place] EKG SITE RITE Result Date: 09/23/2023 If Site Rite image not attached, placement could not be confirmed due to current cardiac rhythm.   Anti-infectives: Anti-infectives (From admission, onward)    Start     Dose/Rate Route Frequency Ordered Stop   09/23/23 1600  DAPTOmycin (CUBICIN) IVPB 700 mg/127mL premix        8 mg/kg  89 kg 200 mL/hr over 30 Minutes Intravenous Daily 09/23/23 1444     09/20/23 1000  ceFEPIme (MAXIPIME) 2 g in sodium chloride 0.9 % 100 mL IVPB  Status:  Discontinued        2 g 200 mL/hr over 30 Minutes Intravenous Every 12 hours 09/20/23 0228 09/20/23 0851   09/20/23 1000  vancomycin (VANCOREADY) IVPB 1500 mg/300 mL  Status:  Discontinued        1,500 mg 150 mL/hr over 120 Minutes Intravenous Every 24 hours 09/20/23 0228 09/23/23 1444   09/20/23 1000   meropenem (MERREM) 1 g in sodium chloride 0.9 % 100 mL IVPB  Status:  Discontinued        1 g 200 mL/hr over 30 Minutes Intravenous Every 8 hours 09/20/23 0908 09/21/23 0356   09/19/23 2145  ceFEPIme (MAXIPIME) 2 g in sodium chloride 0.9 % 100 mL IVPB        2 g 200 mL/hr over 30 Minutes Intravenous  Once 09/19/23 2139 09/20/23 0015   09/19/23 2145  metroNIDAZOLE (FLAGYL) IVPB 500 mg        500 mg 100 mL/hr over 60 Minutes Intravenous  Once 09/19/23 2139 09/20/23 0015   09/19/23 2145  vancomycin (VANCOCIN) IVPB 1000 mg/200 mL premix        1,000 mg 200 mL/hr over 60 Minutes Intravenous  Once 09/19/23 2139 09/20/23 0015       Assessment/Plan: s/p Procedure(s): TRANSESOPHAGEAL ECHOCARDIOGRAM (TEE) Impression: Doing well status post Port-A-Cath removal.  Culture of catheter tip pending.  Will sign off.  Please call me if I can be of further assistance.  LOS: 5 days    Franky Macho 09/24/2023

## 2023-09-25 DIAGNOSIS — R7881 Bacteremia: Secondary | ICD-10-CM | POA: Diagnosis not present

## 2023-09-25 DIAGNOSIS — R509 Fever, unspecified: Secondary | ICD-10-CM | POA: Diagnosis not present

## 2023-09-25 DIAGNOSIS — B958 Unspecified staphylococcus as the cause of diseases classified elsewhere: Secondary | ICD-10-CM | POA: Diagnosis not present

## 2023-09-25 LAB — CULTURE, BLOOD (ROUTINE X 2)
Culture: NO GROWTH
Culture: NO GROWTH
Special Requests: ADEQUATE
Special Requests: ADEQUATE

## 2023-09-25 NOTE — Progress Notes (Signed)
       Date: 09/25/2023  Patient name: Jose Lawrence  Medical record number: [id number]  Date of birth: 08/02/1941   Patient is sp port removal  Post port removal cultures NGTD  TEE not yet read  Catheter tip with Staph epi-- would disregard this organism  Dr. Thedore Mins is back tomorrow.   Acey Lav 09/25/2023, 3:22 PM

## 2023-09-25 NOTE — Plan of Care (Signed)

## 2023-09-25 NOTE — Progress Notes (Signed)
Held coreg d/t low pulse rate of 50

## 2023-09-25 NOTE — Progress Notes (Signed)
PROGRESS NOTE   Jose Lawrence  BJY:[id number] DOB: 06-16-1941 DOA: 09/19/2023 PCP: Ignatius Specking, MD   Chief Complaint  Patient presents with   Influenza   Level of care: Med-Surg  Brief Admission History:  83 y.o. 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  He complained of fever of 102.42F yesterday.  Patient was admitted from 06/08/2023 to 06/10/2023 due to sepsis due to bacteremia with isolation of Streptococcus agalactiae from blood culture and patient was treated with IV Rocephin initially and discharged on amoxicillin 1 g 3 times daily for additional 8 days to complete 10 days of therapy.  He also had ESBL UTI with hematuria at that time.   ED Course:  In the emergency department, BP was 171/70, other vital signs are within normal range.  Workup in the ED showed WBC 11.6, hemoglobin 9.0, hematocrit 33.6, MCV 111.3.  BMP was normal except for sodium of 132, bicarb 21, blood glucose 119, albumin 2.5, AST 63, ALT 37, ALP 99, total bilirubin 5.8.  Lactic acid 3.1 > 3.4, urinalysis was normal.  Influenza A, B, SARS-CoV-2, RSV was negative.  CT chest, abdomen and pelvis with contrast showed interval increase in size of a moderate right and small left pleural effusion. Almost complete collapse of the right lower lobe.  Cirrhosis with portal hypertension. No focal liver lesions identified.  Chest x-ray showed developing pleural effusions with adjacent opacity, right greater than left.  Patient was empirically started on IV cefepime, metronidazole and vancomycin.  Zofran was given and IV hydration per sepsis protocol was provided Hospitalist was asked to admit patient for further evaluation and management.    Assessment and Plan:  Staph lugdunensis Bacteremia  -ID consult appreciated, no vegetations seen on TTE, will ask cardiology to consider TEE on 2/13 -TEE completed 2/14 no vegetations found -Continue daptomycin per ID thru 2/24 given negative TEE  -repeat BCs ordered 2/12 per ID no growth to date  -TEE done 2/14, no vegetations found -Port removed by Dr. Lovell Sheehan on 2/14 and blood cultures repeated after port removed -PICC line to be placed 2/15 -TOC working on home health for IV abx -per ID final recommendations continue daptomycin thru 10/03/23. -per TOC patient likely won't be able to discharge home until Mon or Tues due to home health agency ability to come and train patient and family on home IV antibiotics   TEE 09/23/23: FINDINGS No echodensity consistent with vegetation on aortic, mitral, tricuspid and pulmonic valves is noted. No evidence of infective endocarditis.  Lactic acidosis - resolved    Leukocytosis -he responded favorably to supportive measures     Pleural effusion Right - s/p thoracentesis 09/21/23 CT chest showed mod R pleural effusion in the setting of his cirrhosis. Patient reports he has been symptomatic from this for about 2 weeks now. IR consulted on admission.  -US thoracentesis completed 2/12 with 2.2 L fluid removed  -f/u pleural fluid studies - no signs of infection, awaiting final culture and tests   Non alcoholic cirrhosis  Coagulopathy -stable, follow    Mildly elevated AST and tbili 4. INR 2.5. MELDNa 28. Has had extensive work up, etiology remains unclear. No encephalopathy or significant ascites.  -Holding diuretic for now due to N/V   Hyponatremia  Likely multifactorial. Hypovolemia from N/V and cirrhosis  Continue to monitor with continued PO intake.    Macrocytic anemia  Chronic thrombocytopenia  In the setting of his cirrhosis.  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 Ellin Saba   Acquired hypothyroidism  Continue synthroid    Atrial fibrillation, chronic He has been on Coreg for rate control, patient not on anticoagulation possibly because of persistent thrombocytopenia and anemia  He is having bradycardia and we have had to hold the coreg for now  Bradycardia -asymptomatic -holding carvedilol due to persistent bradycardia HR<60   Controlled type 2 diabetes mellitus without complication  Diet controlled at this time  DVT prophylaxis: SCDs Code Status: Full  Family Communication: wife bedside 2/12, telephone call 2/14, bedside 2/16 Disposition: home tomorrow with [facility] for home IV antibiotics    Consultants:  ID Cardiology for TEE  Surgery for port removal   Procedures:  US thoracentesis 2/12: 2.2L fluid removed  TEE  2/14  Port removal 2/14 Dr. Lovell Sheehan   Antimicrobials:  Vancomycin 2/10>>2/14  Daptomycin 2/14>>  Subjective: Agreeable to home IV abx and tolerating PICC line.  He wants to go home as soon as possible.     Objective: Vitals:   09/24/23 1720 09/24/23 2209 09/25/23 0622 09/25/23 0827  BP: 137/71 (!) 125/53 (!) 122/58   Pulse: (!) 50 (!) 55 (!) 40 (!) 50  Resp:  18 16   Temp:  97.7 F (36.5 C) 97.8 F (36.6 C)   TempSrc:  Oral Oral   SpO2: 99% 97% 98%   Weight:      Height:        Intake/Output Summary (Last 24 hours) at 09/25/2023 1253 Last data filed at 09/25/2023 0500 Gross per 24 hour  Intake 340 ml  Output --  Net 340 ml   Filed Weights   09/19/23 1528 09/23/23 1304  Weight: 89 kg 89 kg   Examination:  General exam: Appears calm and comfortable  Respiratory system: no increased WOB. Cardiovascular system: normal S1 & S2 heard. No JVD, murmurs, rubs, gallops or clicks. No pedal edema. Gastrointestinal system: Abdomen is nondistended, soft and nontender. No organomegaly or masses felt. Normal bowel sounds heard. Central nervous system: Alert and  oriented. No focal neurological deficits. Extremities: Symmetric 5 x 5 power. Skin: No rashes, lesions or ulcers. Psychiatry: Judgement and insight appear normal. Mood & affect appropriate.   Data Reviewed: I have personally reviewed following labs and imaging studies  CBC: Recent Labs  Lab 09/19/23 1547 09/20/23 0515 09/21/23 0512 09/22/23 0340 09/23/23 0437 09/24/23 0452  WBC 11.6* 10.8* 5.8 5.6 4.7 5.2  NEUTROABS 10.4*  --  3.6 3.1 2.5 2.9  HGB 11.0* 9.4* 9.5* 9.5* 9.7* 9.5*  HCT 33.6* 28.0* 28.1* 27.7* 28.1* 28.7*  MCV 111.3* 110.7* 111.1* 109.1* 107.3* 110.0*  PLT 60* 58* 53* 56* 56* 60*    Basic Metabolic Panel: Recent Labs  Lab 09/19/23 1547 09/20/23 0515 09/21/23 0512 09/22/23 0340 09/23/23 0437  NA 132* 129* 130* 129* 131*  K 4.4 4.6 4.9 4.2 4.1  CL 102 102 102 102 104  CO2 21* 21* 25 24 24   GLUCOSE 119* 137* 142* 132* 146*  BUN 20 24* 31* 25* 19  CREATININE 1.11 1.19 1.33* 1.20 1.14  CALCIUM 8.6* 7.9* 8.2* 8.0* 7.8*  MG  --  1.7  --  1.9  --  PHOS  --  3.0  --   --   --     CBG: Recent Labs  Lab 09/23/23 1340  GLUCAP 87    Recent Results (from the past 240 hours)  Resp panel by RT-PCR (RSV, Flu A&B, Covid) Anterior Nasal Swab     Status: None   Collection Time: 09/19/23  2:34 PM   Specimen: Anterior Nasal Swab  Result Value Ref Range Status   SARS Coronavirus 2 by RT PCR NEGATIVE NEGATIVE Final    Comment: (NOTE) SARS-CoV-2 target nucleic acids are NOT DETECTED.  The SARS-CoV-2 RNA is generally detectable in upper respiratory specimens during the acute phase of infection. The lowest concentration of SARS-CoV-2 viral copies this assay can detect is 138 copies/mL. A negative result does not preclude SARS-Cov-2 infection and should not be used as the sole basis for treatment or other patient management decisions. A negative result may occur with  improper specimen collection/handling, submission of specimen other than nasopharyngeal swab,  presence of viral mutation(s) within the areas targeted by this assay, and inadequate number of viral copies(<138 copies/mL). A negative result must be combined with clinical observations, patient history, and epidemiological information. The expected result is Negative.  Fact Sheet for Patients:  BloggerCourse.com  Fact Sheet for Healthcare Providers:  SeriousBroker.it  This test is no t yet approved or cleared by the [place] FDA and  has been authorized for detection and/or diagnosis of SARS-CoV-2 by FDA under an Emergency Use Authorization (EUA). This EUA will remain  in effect (meaning this test can be used) for the duration of the COVID-19 declaration under Section 564(b)(1) of the Act, 21 U.S.C.section 360bbb-3(b)(1), unless the authorization is terminated  or revoked sooner.       Influenza A by PCR NEGATIVE NEGATIVE Final   Influenza B by PCR NEGATIVE NEGATIVE Final    Comment: (NOTE) The Xpert Xpress SARS-CoV-2/FLU/RSV plus assay is intended as an aid in the diagnosis of influenza from Nasopharyngeal swab specimens and should not be used as a sole basis for treatment. Nasal washings and aspirates are unacceptable for Xpert Xpress SARS-CoV-2/FLU/RSV testing.  Fact Sheet for Patients: BloggerCourse.com  Fact Sheet for Healthcare Providers: SeriousBroker.it  This test is not yet approved or cleared by the [place] FDA and has been authorized for detection and/or diagnosis of SARS-CoV-2 by FDA under an Emergency Use Authorization (EUA). This EUA will remain in effect (meaning this test can be used) for the duration of the COVID-19 declaration under Section 564(b)(1) of the Act, 21 U.S.C. section 360bbb-3(b)(1), unless the authorization is terminated or revoked.     Resp Syncytial Virus by PCR NEGATIVE NEGATIVE Final    Comment: (NOTE) Fact Sheet for  Patients: BloggerCourse.com  Fact Sheet for Healthcare Providers: SeriousBroker.it  This test is not yet approved or cleared by the [place] FDA and has been authorized for detection and/or diagnosis of SARS-CoV-2 by FDA under an Emergency Use Authorization (EUA). This EUA will remain in effect (meaning this test can be used) for the duration of the COVID-19 declaration under Section 564(b)(1) of the Act, 21 U.S.C. section 360bbb-3(b)(1), unless the authorization is terminated or revoked.  Performed at [facility], [address], [place], [place] [address]   Blood culture (routine x 2)     Status: Abnormal (Preliminary result)   Collection Time: 09/19/23  4:05 PM   Specimen: BLOOD  Result Value Ref Range Status   Specimen Description   Final    BLOOD RIGHT  ANTECUBITAL Performed at [facility], [address], [place], [place] [address]    Special Requests   Final    BOTTLES DRAWN AEROBIC AND ANAEROBIC Blood Culture results may not be optimal due to an inadequate volume of blood received in culture bottles Performed at [facility], [address], [place], [place] [address]    Culture  Setup Time   Final    GRAM POSITIVE COCCI IN BOTH AEROBIC AND ANAEROBIC BOTTLES Gram Stain Report Called to,Read Back By and Verified With: [facility] WHITE AT 0919 09/20/2023 BY A. SNYDER CRITICAL RESULT CALLED TO, READ BACK BY AND VERIFIED WITH: PHARMD LORI POOLE ON 09/20/23 @ 1332 BY DRT    Culture (A)  Final    STAPHYLOCOCCUS LUGDUNENSIS Sent to [facility] for further susceptibility testing. Performed at [facility], [address] [place], [place] [address]    Report Status PENDING  Incomplete   Organism ID, Bacteria STAPHYLOCOCCUS LUGDUNENSIS  Final      Susceptibility   Staphylococcus lugdunensis - MIC*    CIPROFLOXACIN <=0.5 SENSITIVE Sensitive     ERYTHROMYCIN >=8 RESISTANT Resistant     GENTAMICIN <=0.5 SENSITIVE Sensitive      OXACILLIN >=4 RESISTANT Resistant     TETRACYCLINE <=1 SENSITIVE Sensitive     VANCOMYCIN 1 SENSITIVE Sensitive     TRIMETH/SULFA <=10 SENSITIVE Sensitive     CLINDAMYCIN INTERMEDIATE Intermediate     RIFAMPIN <=0.5 SENSITIVE Sensitive     Inducible Clindamycin NEGATIVE Sensitive     * STAPHYLOCOCCUS LUGDUNENSIS  Blood Culture ID Panel (Reflexed)     Status: Abnormal   Collection Time: 09/19/23  4:05 PM  Result Value Ref Range Status   Enterococcus faecalis NOT DETECTED NOT DETECTED Final   Enterococcus Faecium NOT DETECTED NOT DETECTED Final   Listeria monocytogenes NOT DETECTED NOT DETECTED Final   Staphylococcus species DETECTED (A) NOT DETECTED Final    Comment: CRITICAL RESULT CALLED TO, READ BACK BY AND VERIFIED WITH: PHARMD LORI POOLE ON 09/20/23 @ 1332 BY DRT    Staphylococcus aureus (BCID) NOT DETECTED NOT DETECTED Final   Staphylococcus epidermidis NOT DETECTED NOT DETECTED Final   Staphylococcus lugdunensis DETECTED (A) NOT DETECTED Final    Comment: Methicillin (oxacillin) resistant coagulase negative staphylococcus. Possible blood culture contaminant (unless isolated from more than one blood culture draw or clinical case suggests pathogenicity). No antibiotic treatment is indicated for blood  culture contaminants. CRITICAL RESULT CALLED TO, READ BACK BY AND VERIFIED WITH: PHARMD LORI POOLE ON 09/20/23 @ 1332 BY DRT    Streptococcus species NOT DETECTED NOT DETECTED Final   Streptococcus agalactiae NOT DETECTED NOT DETECTED Final   Streptococcus pneumoniae NOT DETECTED NOT DETECTED Final   Streptococcus pyogenes NOT DETECTED NOT DETECTED Final   A.calcoaceticus-baumannii NOT DETECTED NOT DETECTED Final   Bacteroides fragilis NOT DETECTED NOT DETECTED Final   Enterobacterales NOT DETECTED NOT DETECTED Final   Enterobacter cloacae complex NOT DETECTED NOT DETECTED Final   Escherichia coli NOT DETECTED NOT DETECTED Final   Klebsiella aerogenes NOT DETECTED NOT DETECTED Final    Klebsiella oxytoca NOT DETECTED NOT DETECTED Final   Klebsiella pneumoniae NOT DETECTED NOT DETECTED Final   Proteus species NOT DETECTED NOT DETECTED Final   Salmonella species NOT DETECTED NOT DETECTED Final   Serratia marcescens NOT DETECTED NOT DETECTED Final   Haemophilus influenzae NOT DETECTED NOT DETECTED Final   Neisseria meningitidis NOT DETECTED NOT DETECTED Final   Pseudomonas aeruginosa NOT DETECTED NOT DETECTED Final   Stenotrophomonas  maltophilia NOT DETECTED NOT DETECTED Final   Candida albicans NOT DETECTED NOT DETECTED Final   Candida auris NOT DETECTED NOT DETECTED Final   Candida glabrata NOT DETECTED NOT DETECTED Final   Candida krusei NOT DETECTED NOT DETECTED Final   Candida parapsilosis NOT DETECTED NOT DETECTED Final   Candida tropicalis NOT DETECTED NOT DETECTED Final   Cryptococcus neoformans/gattii NOT DETECTED NOT DETECTED Final   Methicillin resistance mecA/C DETECTED (A) NOT DETECTED Final    Comment: CRITICAL RESULT CALLED TO, READ BACK BY AND VERIFIED WITH: PHARMD LORI POOLE ON 09/20/23 @ 1332 BY DRT Performed at [facility], [address], [place], [place] [address]   Blood culture (routine x 2)     Status: Abnormal   Collection Time: 09/19/23  4:40 PM   Specimen: BLOOD  Result Value Ref Range Status   Specimen Description   Final    BLOOD LEFT ANTECUBITAL Performed at [facility], [address], [place], [place] [address]    Special Requests   Final    BOTTLES DRAWN AEROBIC AND ANAEROBIC Blood Culture adequate volume Performed at [facility], [address] [place], [place] [address]    Culture  Setup Time   Final    GRAM POSITIVE COCCI IN BOTH AEROBIC AND ANAEROBIC BOTTLES Gram Stain Report Called to,Read Back By and Verified With: MEGAN WHITE, RN AT [phone] 09/20/2023 BY Kandice Moos Performed at [facility], [address] [place], [place] [address]    Culture (A)  Final    STAPHYLOCOCCUS LUGDUNENSIS SUSCEPTIBILITIES PERFORMED ON  PREVIOUS CULTURE WITHIN THE LAST 5 DAYS. Performed at [facility], [address], [place], [place] [address]    Report Status 09/22/2023 FINAL  Final  Respiratory (~20 pathogens) panel by PCR     Status: None   Collection Time: 09/20/23  6:53 AM   Specimen: Nasopharyngeal Swab; Respiratory  Result Value Ref Range Status   Adenovirus NOT DETECTED NOT DETECTED Final   Coronavirus 229E NOT DETECTED NOT DETECTED Final    Comment: (NOTE) The Coronavirus on the Respiratory Panel, DOES NOT test for the novel  Coronavirus (2019 nCoV)    Coronavirus HKU1 NOT DETECTED NOT DETECTED Final   Coronavirus NL63 NOT DETECTED NOT DETECTED Final   Coronavirus OC43 NOT DETECTED NOT DETECTED Final   Metapneumovirus NOT DETECTED NOT DETECTED Final   Rhinovirus / Enterovirus NOT DETECTED NOT DETECTED Final   Influenza A NOT DETECTED NOT DETECTED Final   Influenza B NOT DETECTED NOT DETECTED Final   Parainfluenza Virus 1 NOT DETECTED NOT DETECTED Final   Parainfluenza Virus 2 NOT DETECTED NOT DETECTED Final   Parainfluenza Virus 3 NOT DETECTED NOT DETECTED Final   Parainfluenza Virus 4 NOT DETECTED NOT DETECTED Final   Respiratory Syncytial Virus NOT DETECTED NOT DETECTED Final   Bordetella pertussis NOT DETECTED NOT DETECTED Final   Bordetella Parapertussis NOT DETECTED NOT DETECTED Final   Chlamydophila pneumoniae NOT DETECTED NOT DETECTED Final   Mycoplasma pneumoniae NOT DETECTED NOT DETECTED Final    Comment: Performed at [facility], [address], [place], [place] [address]  Culture, blood (Routine X 2) w Reflex to ID Panel     Status: None   Collection Time: 09/20/23  3:45 PM   Specimen: Left Antecubital; Blood  Result Value Ref Range Status   Specimen Description   Final    LEFT ANTECUBITAL BOTTLES DRAWN AEROBIC AND ANAEROBIC   Special Requests Blood Culture adequate volume  Final  Culture   Final    NO GROWTH 5 DAYS Performed at [facility], [address]  [place], [place] [address]    Report Status 09/25/2023 FINAL  Final  Culture, blood (Routine X 2) w Reflex to ID Panel     Status: None   Collection Time: 09/20/23  3:46 PM   Specimen: Right Antecubital; Blood  Result Value Ref Range Status   Specimen Description   Final    RIGHT ANTECUBITAL BOTTLES DRAWN AEROBIC AND ANAEROBIC   Special Requests Blood Culture adequate volume  Final   Culture   Final    NO GROWTH 5 DAYS Performed at [facility], [address], [place], [place] [address]    Report Status 09/25/2023 FINAL  Final  Gram stain     Status: None   Collection Time: 09/21/23 10:20 AM   Specimen: Pleura  Result Value Ref Range Status   Specimen Description PLEURAL  Final   Special Requests NONE  Final   Gram Stain   Final    WBC PRESENT, PREDOMINANTLY MONONUCLEAR NO ORGANISMS SEEN CYTOSPIN SMEAR Performed at [facility], [address], [place], [place] [address]    Report Status 09/21/2023 FINAL  Final  Culture, body fluid w Gram Stain-bottle     Status: None (Preliminary result)   Collection Time: 09/21/23 10:20 AM   Specimen: Pleura  Result Value Ref Range Status   Specimen Description PLEURAL  Final   Special Requests BOTTLES DRAWN AEROBIC AND ANAEROBIC 10 CC  Final   Culture   Final    NO GROWTH 4 DAYS Performed at [facility], [address] [place], [place] [address]    Report Status PENDING  Incomplete  Cath Tip Culture     Status: None (Preliminary result)   Collection Time: 09/23/23  3:55 PM   Specimen: Catheter Tip  Result Value Ref Range Status   Specimen Description   Final    CATH TIP Performed at [facility], [address], [place], [place] [address]    Special Requests   Final    NONE Performed at [facility], [address], [place], [place] [address]    Culture   Final    CULTURE REINCUBATED FOR BETTER GROWTH Performed at [facility], [address], [place], [place] [address]    Report Status PENDING  Incomplete  Culture, blood  (Routine X 2) w Reflex to ID Panel     Status: None (Preliminary result)   Collection Time: 09/23/23 10:08 PM   Specimen: Right Antecubital; Blood  Result Value Ref Range Status   Specimen Description RIGHT ANTECUBITAL  Final   Special Requests   Final    BOTTLES DRAWN AEROBIC AND ANAEROBIC Blood Culture adequate volume   Culture   Final    NO GROWTH 2 DAYS Performed at [facility], [address], [place], [place] [address]    Report Status PENDING  Incomplete  Culture, blood (Routine X 2) w Reflex to ID Panel     Status: None (Preliminary result)   Collection Time: 09/23/23 10:09 PM   Specimen: Left Antecubital  Result Value Ref Range Status   Specimen Description LEFT ANTECUBITAL  Final   Special Requests   Final    BOTTLES DRAWN AEROBIC AND ANAEROBIC Blood Culture adequate volume   Culture   Final    NO GROWTH 2 DAYS Performed at [facility], [address] [place], [place] [address]    Report Status PENDING  Incomplete  Radiology Studies: DG CHEST PORT 1 VIEW Result Date: 09/24/2023 CLINICAL DATA:  PICC line placement. EXAM: PORTABLE CHEST 1 VIEW COMPARISON:  09/21/2023 FINDINGS: Right arm PICC line is identified with tip at the superior cavoatrial junction the heart size appears within normal limits. Lung volumes are low. Blunting of the costophrenic angles. Mild diffuse increase interstitial markings noted which may reflect pulmonary vascular congestion or early pulmonary edema. Atelectasis noted in the right base. IMPRESSION: 1. Right arm PICC line tip is at the superior cavoatrial junction. 2. Low lung volumes. 3. Mild diffuse increase interstitial markings may reflect pulmonary vascular congestion or early pulmonary edema. 4. Blunting of the costophrenic angles suggest small residual pleural effusions. Electronically Signed   By: Signa Kell M.D.   On: 09/24/2023 12:13   [place] EKG SITE RITE Result Date: 09/23/2023 If Site Rite image not attached, placement could not be  confirmed due to current cardiac rhythm.   Scheduled Meds:  Chlorhexidine Gluconate Cloth  6 each Topical Daily   feeding supplement (GLUCERNA SHAKE)  237 mL Oral TID BM   furosemide  40 mg Oral Daily   levothyroxine  50 mcg Oral Q0600   loperamide  4 mg Oral TID AC & HS   potassium chloride SA  10 mEq Oral Daily   sodium chloride flush  10-40 mL Intracatheter Q12H   tamsulosin  0.4 mg Oral QPC supper   Continuous Infusions:  DAPTOmycin 700 mg (09/24/23 1457)    LOS: 6 days   Time spent: 55 mins  Kechia Yahnke Laural Benes, MD How to contact the [facility] Attending or Consulting provider 7A - 7P or covering provider during after hours 7P -7A, for this patient?  Check the care team in [facility] and look for a) attending/consulting [facility] provider listed and b) the [facility] team listed Log into [email or web address] to find provider on call.  Locate the [facility] provider you are looking for under [facility] and page to a number that you can be directly reached. If you still have difficulty reaching the provider, please page the [facility] (Director on Call) for the Hospitalists listed on amion for assistance.  09/25/2023, 12:53 PM

## 2023-09-26 ENCOUNTER — Encounter (HOSPITAL_COMMUNITY): Payer: Self-pay | Admitting: Internal Medicine

## 2023-09-26 DIAGNOSIS — E039 Hypothyroidism, unspecified: Secondary | ICD-10-CM | POA: Diagnosis not present

## 2023-09-26 DIAGNOSIS — R509 Fever, unspecified: Secondary | ICD-10-CM | POA: Diagnosis not present

## 2023-09-26 DIAGNOSIS — R7881 Bacteremia: Secondary | ICD-10-CM | POA: Diagnosis not present

## 2023-09-26 DIAGNOSIS — I482 Chronic atrial fibrillation, unspecified: Secondary | ICD-10-CM | POA: Diagnosis not present

## 2023-09-26 LAB — ECHO TEE
AV Mean grad: 5 mm[Hg]
AV Peak grad: 8.1 mm[Hg]
Ao pk vel: 1.42 m/s

## 2023-09-26 LAB — CULTURE, BODY FLUID W GRAM STAIN -BOTTLE: Culture: NO GROWTH

## 2023-09-26 LAB — CREATININE, SERUM
Creatinine, Ser: 0.98 mg/dL (ref 0.61–1.24)
GFR, Estimated: >60 mL/min (ref >60)

## 2023-09-26 MED ORDER — DAPTOMYCIN IV (FOR PTA / DISCHARGE USE ONLY): 700.0000 mg | INTRAVENOUS | 0 refills | Status: DC

## 2023-09-26 MED ORDER — DAPTOMYCIN IV (FOR PTA / DISCHARGE USE ONLY): 700.0000 mg | INTRAVENOUS | 0 refills | Status: AC

## 2023-09-26 NOTE — Progress Notes (Signed)
  ID Brief note    83 year old male with history of cirrhosis, group B strep bacteremia back in 2024 admitted with: #Staph lugdunensis bacteremia 2/2 chest port #Chest port with Hx of colon ca SP resection complicated by recurrence requiring chemo till 2020 - Blood culture from admission 2/10 grew 2/2 sets staph lugdunensis.  Chest x-ray showed developing pleural effusion with adjacent opacity right greater than left.  Patient has no respiratory symptoms - CT chest abdomen pelvis showed pleural effusion, almost complete collapse of the right lower lobe.  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 - Denies any recent procedures, no dental work, no GI procedures recently. - Right-sided thoracentesis today with 2.2 L clear fluid removed.  Cultures NG x 2 days -Suspect bacteremia seeded via port.Port removed on 09/23/23 -TEE no vegetation Plans:  2 weeks of dapto from port removal for line infection. Staph epi from cath tip c/w contamination Follow repeat blood cx after port removal  on 2/14   to ensure clearance. Follow up dapto sens for staph lugdunensis PICC placed on 2/15 Follow up with ID on 2/27   ID will sign off  OPAT ORDERS:  Diagnosis: staph lugdunensis bacteremia 2/2 port  No Known Allergies   Discharge antibiotics to be given via PICC line:  Per pharmacy protocol Daptomycin 700 mg IV Q 24 hours    Duration: 2 weeks End Date: 2/28  [facility] Care Per Protocol with [employer] Use: Home health RN for IV administration and teaching, line care and labs.    Labs weekly while on IV antibiotics: _x_ CBC with differential __ BMP **TWICE WEEKLY ON VANCOMYCIN  _x_ CMP __ CRP __ ESR __ Vancomycin trough TWICE WEEKLY _x_ CK  _x_ Please pull PIC at completion of IV antibiotics __ Please leave PIC in place until doctor has seen patient or been notified  Fax weekly labs to [phone]  Clinic Follow Up Appt: 2/27  @ [facility] with Dr. Elinor Parkinson

## 2023-09-26 NOTE — Discharge Instructions (Signed)
IMPORTANT INFORMATION: PAY CLOSE ATTENTION   PHYSICIAN DISCHARGE INSTRUCTIONS  Follow with Primary care provider  Vyas, Dhruv B, MD  and other consultants as instructed by your Hospitalist Physician  SEEK MEDICAL CARE OR RETURN TO EMERGENCY ROOM IF SYMPTOMS COME BACK, WORSEN OR NEW PROBLEM DEVELOPS   Please note: You were cared for by a hospitalist during your hospital stay. Every effort will be made to forward records to your primary care provider.  You can request that your primary care provider send for your hospital records if they have not received them.  Once you are discharged, your primary care physician will handle any further medical issues. Please note that NO REFILLS for any discharge medications will be authorized once you are discharged, as it is imperative that you return to your primary care physician (or establish a relationship with a primary care physician if you do not have one) for your post hospital discharge needs so that they can reassess your need for medications and monitor your lab values.  Please get a complete blood count and chemistry panel checked by your Primary MD at your next visit, and again as instructed by your Primary MD.  Get Medicines reviewed and adjusted: Please take all your medications with you for your next visit with your Primary MD  Laboratory/radiological data: Please request your Primary MD to go over all hospital tests and procedure/radiological results at the follow up, please ask your primary care provider to get all Hospital records sent to his/her office.  In some cases, they will be blood work, cultures and biopsy results pending at the time of your discharge. Please request that your primary care provider follow up on these results.  If you are diabetic, please bring your blood sugar readings with you to your follow up appointment with primary care.    Please call and make your follow up appointments as soon as possible.    Also Note  the following: If you experience worsening of your admission symptoms, develop shortness of breath, life threatening emergency, suicidal or homicidal thoughts you must seek medical attention immediately by calling 911 or calling your MD immediately  if symptoms less severe.  You must read complete instructions/literature along with all the possible adverse reactions/side effects for all the Medicines you take and that have been prescribed to you. Take any new Medicines after you have completely understood and accpet all the possible adverse reactions/side effects.   Do not drive when taking Pain medications or sleeping medications (Benzodiazepines)  Do not take more than prescribed Pain, Sleep and Anxiety Medications. It is not advisable to combine anxiety,sleep and pain medications without talking with your primary care practitioner  Special Instructions: If you have smoked or chewed Tobacco  in the last 2 yrs please stop smoking, stop any regular Alcohol  and or any Recreational drug use.  Wear Seat belts while driving.  Do not drive if taking any narcotic, mind altering or controlled substances or recreational drugs or alcohol.       

## 2023-09-26 NOTE — TOC Transition Note (Signed)
Transition of Care [facility]) - Discharge Note   Patient Details  Name: Jose Lawrence MRN: [id number] Date of Birth: [birth year]  Transition of Care [facility]) CM/SW Contact:  Leitha Bleak, RN Phone Number: 09/26/2023, 11:30 AM   Clinical Narrative:   Patient discharging home today. TOC confirming OPAT order for Pam with [facility]. ID and pharmacy will complete the orders and note. Pam will arrive 2-2:30 to train family.  Maralyn Sago updated with discharge to update the home health agency in [place].    Final next level of care: Home w Home Health Services Barriers to Discharge: Barriers Resolved   Patient Goals and CMS Choice Patient states their goals for this hospitalization and ongoing recovery are:: return back home CMS Medicare.gov Compare Post Acute Care list provided to:: Patient Represenative (must comment) (Spouse Hattie) Choice offered to / list presented to : Spouse [place] ownership interest in [facility].provided to:: Spouse    Discharge Placement                    Patient and family notified of of transfer: 09/26/23  Discharge Plan and Services Additional resources added to the After Visit Summary for   In-house Referral: Clinical Social Work Discharge Planning Services: CM Consult Post Acute Care Choice: Home Health                    HH Arranged: RN [facility] Agency: [facility] Date [facility] Agency Contacted: 09/24/23   Representative spoke with at [facility] Agency: Maralyn Sago  Social Drivers of Health (SDOH) Interventions SDOH Screenings   Food Insecurity: No Food Insecurity (09/20/2023)  Housing: Low Risk  (09/20/2023)  Transportation Needs: No Transportation Needs (09/20/2023)  Utilities: Not At Risk (09/20/2023)  Alcohol Screen: Low Risk  (09/01/2020)  Depression (PHQ2-9): Low Risk  (09/01/2020)  Financial Resource Strain: Low Risk  (09/01/2020)  Physical Activity: Sufficiently Active (09/01/2020)  Social Connections: Socially Integrated  (09/20/2023)  Stress: No Stress Concern Present (09/01/2020)  Tobacco Use: Medium Risk (09/23/2023)    Readmission Risk Interventions    09/26/2023   11:29 AM 09/20/2023    9:49 AM 06/09/2023    2:44 PM  Readmission Risk Prevention Plan  Transportation Screening  Complete Complete  PCP or Specialist Appt within 5-7 Days Complete    PCP or Specialist Appt within 3-5 Days   Not Complete  Home Care Screening Complete    Medication Review (RN CM) Complete    HRI or Home Care Consult  Complete Complete  Social Work Consult for Recovery Care Planning/Counseling  Complete Complete  Palliative Care Screening  Not Applicable Not Applicable  Medication Review Ocean[employer])  Complete Complete

## 2023-09-26 NOTE — Discharge Summary (Signed)
Physician Discharge Summary  Jose Lawrence YNW:[id number] DOB: 06-16-1941 DOA: 09/19/2023  PCP: Ignatius Specking, MD  Admit date: 09/19/2023 Discharge date: 09/26/2023  Admitted From:  HOME  Disposition: HOME WITH HOME HEALTH   Recommendations for Outpatient Follow-up:  Follow up with PCP in 2 weeks Complete IV antibiotics at home thru PICC line thru 10/07/23 Remove PICC line after completion of IV antibiotics Follow up with [facility] in 2 weeks   Home Health: RN for IV antibiotics   Discharge Condition: STABLE   CODE STATUS: FULL DIET: resume previous home diet    Brief Hospitalization Summary: Please see all hospital notes, images, labs for full details of the hospitalization. Brief Admission History:  83 y.o. 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  He complained of fever of 102.79F yesterday.  Patient was admitted from 06/08/2023 to 06/10/2023 due to sepsis due to bacteremia with isolation of Streptococcus agalactiae from blood culture and patient was treated with IV Rocephin initially and discharged on amoxicillin 1 g 3 times daily for additional 8 days to complete 10 days of therapy.  He also had ESBL UTI with hematuria at that time.   ED Course:  In the emergency department, BP was 171/70, other vital signs are within normal range.  Workup in the ED showed WBC 11.6, hemoglobin 9.0, hematocrit 33.6, MCV 111.3.  BMP was normal except for sodium of 132, bicarb 21, blood glucose 119, albumin 2.5, AST 63, ALT 37, ALP 99, total bilirubin 5.8.  Lactic acid 3.1 > 3.4, urinalysis was normal.  Influenza A, B, SARS-CoV-2, RSV was negative.  CT chest, abdomen and pelvis with contrast showed interval increase in  size of a moderate right and small left pleural effusion. Almost complete collapse of the right lower lobe.  Cirrhosis with portal hypertension. No focal liver lesions identified.  Chest x-ray showed developing pleural effusions with adjacent opacity, right greater than left.  Patient was empirically started on IV cefepime, metronidazole and vancomycin.  Zofran was given and IV hydration per sepsis protocol was provided.  Hospitalist was asked to admit patient for further evaluation and management.   Assessment and Plan:   Staph lugdunensis Bacteremia  -ID consult appreciated, no vegetations seen on TTE, will ask cardiology to consider TEE on 2/13 -TEE completed 2/14 no vegetations found -Continue daptomycin per ID thru 2/28 given negative TEE -- remove PICC line after completion  -repeat BCs ordered 2/12 per ID no growth to date  -TEE done 2/14, no vegetations found -Port removed by Dr. Lovell Sheehan on 2/14 and blood cultures repeated after port removed -PICC line to be placed 2/15 -TOC working on home health for IV abx -per ID final recommendations continue daptomycin thru 10/07/23.   TEE 09/23/23: FINDINGS No echodensity consistent with vegetation on aortic, mitral, tricuspid and pulmonic valves is noted. No evidence of infective endocarditis.   Lactic acidosis - resolved     Leukocytosis -he responded favorably to supportive measures     Pleural effusion Right - s/p thoracentesis 09/21/23 CT chest showed mod R pleural effusion in the setting of his cirrhosis. Patient reports he has been symptomatic from this for about 2 weeks now. IR consulted on admission.  -US thoracentesis completed 2/12 with 2.2 L fluid removed  -  f/u pleural fluid studies - no signs of infection, awaiting final culture and tests, follow up with PCP    Non alcoholic cirrhosis  Coagulopathy -stable, follow    Mildly elevated AST and tbili 4. INR 2.5. MELDNa 28. Has had extensive work up, etiology remains unclear. No  encephalopathy or significant ascites.    Hyponatremia - chronic  Likely multifactorial. Hypovolemia from N/V and cirrhosis  Improved to 131     Macrocytic anemia  Chronic thrombocytopenia  In the setting of his cirrhosis.  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 Ellin Saba   Acquired hypothyroidism  Continue synthroid    Atrial fibrillation, chronic He has been on Coreg for rate control, patient not on anticoagulation possibly because of persistent thrombocytopenia and anemia  He is having bradycardia and we have had to hold the coreg for now, follow up with PCP    Bradycardia -asymptomatic -holding carvedilol due to persistent bradycardia HR<60   Controlled type 2 diabetes mellitus without complication  Diet controlled at this time   Discharge Diagnoses:  Principal Problem:   Acute febrile illness Active Problems:   Acquired hypothyroidism   Atrial fibrillation, chronic (HCC)   Cancer of transverse colon (HCC)   Macrocytic anemia   Thrombocytopenia (HCC)   Lactic acidosis   Vomiting   Pleural effusion   Hypoalbuminemia due to protein-calorie malnutrition (HCC)   Bacteremia due to Staphylococcus   Discharge Instructions: Discharge Instructions     [facility] pharmacist to adjust dose for Vancomycin, Aminoglycosides and other anti-infective therapies as requested by physician.   Complete by: As directed    [facility] to provide Cath Flo 2mg    Complete by: As directed    Administer for PICC line occlusion and as ordered by physician for other access device issues.   Anaphylaxis Kit: Provided to treat any anaphylactic reaction to the medication being provided to the patient if First Dose or when requested by physician   Complete by: As directed    Epinephrine 1mg /ml vial / amp: Administer 0.3mg  (0.77ml) subcutaneously  once for moderate to severe anaphylaxis, nurse to call physician and pharmacy when reaction occurs and call 911 if needed for immediate care   Diphenhydramine 50mg /ml IV vial: Administer 25-50mg  IV/IM PRN for first dose reaction, rash, itching, mild reaction, nurse to call physician and pharmacy when reaction occurs   Sodium Chloride 0.9% NS IV: Administer if needed for hypovolemic blood pressure drop or as ordered by physician after call to physician with anaphylactic reaction   Change dressing on IV access line weekly and PRN   Complete by: As directed    Flush IV access with Sodium Chloride 0.9% and Heparin 10 units/ml or 100 units/ml   Complete by: As directed    Home infusion instructions - [facility]   Complete by: As directed    Instructions: Flush IV access with Sodium Chloride 0.9% and Heparin 10units/ml or 100units/ml   Change dressing on IV access line: Weekly and PRN   Instructions Cath Flo 2mg : Administer for PICC Line occlusion and as ordered by physician for other access device   [facility] pharmacist to adjust dose for: Vancomycin, Aminoglycosides and other anti-infective therapies as requested by physician   Method of administration may be changed at the discretion of home infusion pharmacist based upon assessment of the patient and/or caregiver's ability to  self-administer the medication ordered   Complete by: As directed       Allergies as of 09/26/2023   No Known Allergies      Medication List     STOP taking these medications    carvedilol 3.125 MG tablet Commonly known as: Coreg       TAKE these medications    daptomycin IVPB Commonly known as: CUBICIN Inject 700 mg into the vein daily for 11 days. Indication:  staph lugdunensis bacteremia First Dose: Yes Last Day of Therapy:  10/07/23 Labs - Once weekly:  CBC/D, BMP, and CPK Labs - Once weekly: ESR and CRP Method of administration: IV Push Method of administration may be  changed at the discretion of home infusion pharmacist based upon assessment of the patient and/or caregiver's ability to self-administer the medication ordered.   furosemide 40 MG tablet Commonly known as: LASIX Take 20-40 mg by mouth daily as needed.   lactose free nutrition Liqd Take 237 mLs by mouth daily at 6 (six) AM.   levothyroxine 50 MCG tablet Commonly known as: SYNTHROID Take 50 mcg by mouth daily before breakfast.   loperamide 2 MG capsule Commonly known as: IMODIUM Take 2 capsules (4 mg total) by mouth 4 (four) times daily -  before meals and at bedtime.   potassium chloride 10 MEQ tablet Commonly known as: KLOR-CON Take 1 tablet (10 mEq total) by mouth daily as needed. Take when you take Lasix/Furosemide   tamsulosin 0.4 MG Caps capsule Commonly known as: FLOMAX Take 1 capsule (0.4 mg total) by mouth daily after supper.   Vitamin D (Ergocalciferol) 1.25 MG (50000 UNIT) Caps capsule Commonly known as: DRISDOL Take 1 capsule by mouth once a week               Discharge Care Instructions  (From admission, onward)           Start     Ordered   09/26/23 0000  Change dressing on IV access line weekly and PRN  (Home infusion instructions - [facility] )        09/26/23 0937            Follow-up Information     [facility] Follow up.   Why: [facility] will call you to schedule in home visits Contact information: [address] [place] [address] [phone]         [facility] Follow up.   Why: Home antibiotic will be ordered and delivered [facility] - [phone]        Ignatius Specking, MD. Schedule an appointment as soon as possible for a visit in 2 week(s).   Specialty: Internal Medicine Why: Hospital Follow Up Contact information: [address] [place] [address] [phone]          [facility] [place]. [facility]. Schedule an appointment as soon as  possible for a visit in 2 week(s).   Specialty: Infectious Diseases Why: Hospital Follow Up Contact information: [address] [place], [address] [place] [address] [phone]               No Known Allergies Allergies as of 09/26/2023   No Known Allergies      Medication List     STOP taking these medications    carvedilol 3.125 MG tablet Commonly known as: Coreg       TAKE these medications  daptomycin IVPB Commonly known as: CUBICIN Inject 700 mg into the vein daily for 11 days. Indication:  staph lugdunensis bacteremia First Dose: Yes Last Day of Therapy:  10/07/23 Labs - Once weekly:  CBC/D, BMP, and CPK Labs - Once weekly: ESR and CRP Method of administration: IV Push Method of administration may be changed at the discretion of home infusion pharmacist based upon assessment of the patient and/or caregiver's ability to self-administer the medication ordered.   furosemide 40 MG tablet Commonly known as: LASIX Take 20-40 mg by mouth daily as needed.   lactose free nutrition Liqd Take 237 mLs by mouth daily at 6 (six) AM.   levothyroxine 50 MCG tablet Commonly known as: SYNTHROID Take 50 mcg by mouth daily before breakfast.   loperamide 2 MG capsule Commonly known as: IMODIUM Take 2 capsules (4 mg total) by mouth 4 (four) times daily -  before meals and at bedtime.   potassium chloride 10 MEQ tablet Commonly known as: KLOR-CON Take 1 tablet (10 mEq total) by mouth daily as needed. Take when you take Lasix/Furosemide   tamsulosin 0.4 MG Caps capsule Commonly known as: FLOMAX Take 1 capsule (0.4 mg total) by mouth daily after supper.   Vitamin D (Ergocalciferol) 1.25 MG (50000 UNIT) Caps capsule Commonly known as: DRISDOL Take 1 capsule by mouth once a week               Discharge Care Instructions  (From admission, onward)           Start     Ordered   09/26/23 0000  Change dressing on IV access line weekly and  PRN  (Home infusion instructions - [facility] )        09/26/23 0937            Procedures/Studies: DG CHEST PORT 1 VIEW Result Date: 09/24/2023 CLINICAL DATA:  PICC line placement. EXAM: PORTABLE CHEST 1 VIEW COMPARISON:  09/21/2023 FINDINGS: Right arm PICC line is identified with tip at the superior cavoatrial junction the heart size appears within normal limits. Lung volumes are low. Blunting of the costophrenic angles. Mild diffuse increase interstitial markings noted which may reflect pulmonary vascular congestion or early pulmonary edema. Atelectasis noted in the right base. IMPRESSION: 1. Right arm PICC line tip is at the superior cavoatrial junction. 2. Low lung volumes. 3. Mild diffuse increase interstitial markings may reflect pulmonary vascular congestion or early pulmonary edema. 4. Blunting of the costophrenic angles suggest small residual pleural effusions. Electronically Signed   By: Signa Kell M.D.   On: 09/24/2023 12:13   [place] EKG SITE RITE Result Date: 09/23/2023 If Site Rite image not attached, placement could not be confirmed due to current cardiac rhythm.  ECHOCARDIOGRAM COMPLETE Result Date: 09/21/2023    ECHOCARDIOGRAM REPORT   Patient Name:   Jose Lawrence Date of Exam: 09/21/2023 Medical Rec #:  [id number]         Height:       72.0 in Accession #:    [id number]        Weight:       196.2 lb Date of Birth:  03-12-1941         BSA:          2.113 m Patient Age:    82 years          BP:           120/59 mmHg Patient Gender: M  HR:           41 bpm. [facility] Procedure: 2D Echo, Color Doppler and Cardiac Doppler (Both Spectral and Color            Flow Doppler were utilized during procedure). Indications:    Endocarditis [[id number]]  History:        Patient has no prior history of Echocardiogram examinations and                 Patient has prior history of Echocardiogram examinations, most                 recent 05/30/2023.  Arrythmias:Atrial Fibrillation; Risk                 Factors:Hypertension and Diabetes.  Sonographer:    Webb Laws Referring Phys: [id number] [facility] SINGH IMPRESSIONS  1. Left ventricular ejection fraction, by estimation, is 55 to 60%. The left ventricle has normal function. The left ventricle has no regional wall motion abnormalities. Left ventricular diastolic parameters are indeterminate.  2. Right ventricular systolic function is low normal. The right ventricular size is mildly enlarged. There is moderately elevated pulmonary artery systolic pressure. The estimated right ventricular systolic pressure is 46.1 mmHg.  3. Left atrial size was upper normal.  4. Right atrial size was mildly dilated.  5. Ascitic fluid and left pleural effusion noted.  6. The mitral valve is grossly normal. Mild mitral valve regurgitation.  7. The aortic valve is tricuspid. There is mild calcification of the aortic valve. Aortic valve regurgitation is not visualized. Aortic valve sclerosis is present, with no evidence of aortic valve stenosis.  8. The inferior vena cava is dilated in size with <50% respiratory variability, suggesting right atrial pressure of 15 mmHg. Comparison(s): Prior images reviewed side by side. No obvious valvular vegetations. FINDINGS  Left Ventricle: Left ventricular ejection fraction, by estimation, is 55 to 60%. The left ventricle has normal function. The left ventricle has no regional wall motion abnormalities. Strain imaging was not performed. The left ventricular internal cavity  size was normal in size. There is no left ventricular hypertrophy. Left ventricular diastolic function could not be evaluated due to atrial fibrillation. Left ventricular diastolic parameters are indeterminate. Right Ventricle: The right ventricular size is mildly enlarged. No increase in right ventricular wall thickness. Right ventricular systolic function is low normal. There is moderately elevated pulmonary artery systolic  pressure. The tricuspid regurgitant  velocity is 2.79 m/s, and with an assumed right atrial pressure of 15 mmHg, the estimated right ventricular systolic pressure is 46.1 mmHg. Left Atrium: Left atrial size was upper normal. Right Atrium: Right atrial size was mildly dilated. Pericardium: Ascitic fluid and left pleural effusion noted. There is no evidence of pericardial effusion. Mitral Valve: The mitral valve is grossly normal. Mild mitral valve regurgitation. Tricuspid Valve: The tricuspid valve is grossly normal. Tricuspid valve regurgitation is trivial. Aortic Valve: The aortic valve is tricuspid. There is mild calcification of the aortic valve. There is mild aortic valve annular calcification. Aortic valve regurgitation is not visualized. Aortic valve sclerosis is present, with no evidence of aortic valve stenosis. Pulmonic Valve: The pulmonic valve was grossly normal. Pulmonic valve regurgitation is trivial. Aorta: The aortic root is normal in size and structure. Venous: The inferior vena cava is dilated in size with less than 50% respiratory variability, suggesting right atrial pressure of 15 mmHg. IAS/Shunts: No atrial level shunt detected by color flow Doppler. Additional Comments: 3D imaging  was not performed.  LEFT VENTRICLE PLAX 2D LVIDd:         4.60 cm   Diastology LVIDs:         2.90 cm   LV e' medial:    7.07 cm/s LV PW:         1.00 cm   LV E/e' medial:  14.4 LV IVS:        0.90 cm   LV e' lateral:   10.60 cm/s LVOT diam:     1.80 cm   LV E/e' lateral: 9.6 LV SV:         55 LV SV Index:   26 LVOT Area:     2.54 cm  RIGHT VENTRICLE            IVC RV Basal diam:  4.70 cm    IVC diam: 2.70 cm RV Mid diam:    3.80 cm RV S prime:     8.81 cm/s TAPSE (M-mode): 2.2 cm LEFT ATRIUM             Index        RIGHT ATRIUM           Index LA diam:        4.30 cm 2.03 cm/m   RA Area:     24.30 cm LA Vol (A2C):   52.6 ml 24.89 ml/m  RA Volume:   75.30 ml  35.63 ml/m LA Vol (A4C):   66.3 ml 31.37 ml/m LA  Biplane Vol: 60.3 ml 28.53 ml/m  AORTIC VALVE LVOT Vmax:   83.90 cm/s LVOT Vmean:  60.500 cm/s LVOT VTI:    0.215 m  AORTA Ao Root diam: 3.30 cm MITRAL VALVE                TRICUSPID VALVE MV Area (PHT): 2.77 cm     TR Peak grad:   31.1 mmHg MV Decel Time: 274 msec     TR Vmax:        279.00 cm/s MV E velocity: 102.00 cm/s                             SHUNTS                             Systemic VTI:  0.22 m                             Systemic Diam: 1.80 cm Nona Dell MD Electronically signed by Nona Dell MD Signature Date/Time: 09/21/2023/3:46:39 PM    Final    DG Chest 1 View Result Date: 09/21/2023 CLINICAL DATA:  Right pleural effusion, status post thoracentesis EXAM: CHEST  1 VIEW COMPARISON:  09/19/2023 FINDINGS: Nearly resolved right effusion following thoracentesis. Improved right basilar aeration. Persistent low lung volumes with bibasilar atelectasis/opacities. No pneumothorax. Trachea midline. Left subclavian power port catheter tip proximal SVC as before. Aorta atherosclerotic. IMPRESSION: Nearly resolved right effusion following thoracentesis. No pneumothorax. Electronically Signed   By: Judie Petit.  Shick M.D.   On: 09/21/2023 11:07   US THORACENTESIS ASP PLEURAL SPACE W/IMG GUIDE Result Date: 09/21/2023 INDICATION: Colon cancer, shortness of breath, right pleural effusion EXAM: ULTRASOUND GUIDED RIGHT THORACENTESIS MEDICATIONS: 1% lidocaine local COMPLICATIONS: None immediate. PROCEDURE: An ultrasound guided thoracentesis was thoroughly discussed with the patient and questions answered. The benefits, risks, alternatives and complications  were also discussed. The patient understands and wishes to proceed with the procedure. Written consent was obtained. Ultrasound was performed to localize and mark an adequate pocket of fluid in the right chest. The area was then prepped and draped in the normal sterile fashion. 1% Lidocaine was used for local anesthesia. Under ultrasound guidance a 6 Fr  Safe-T-Centesis catheter was introduced. Thoracentesis was performed. The catheter was removed and a dressing applied. FINDINGS: A total of approximately 2.1 L of clear pleural fluid was removed. Samples were sent to the laboratory as requested by the clinical team. IMPRESSION: Successful ultrasound guided right thoracentesis yielding 2.1 L of pleural fluid. Electronically Signed   By: Judie Petit.  Shick M.D.   On: 09/21/2023 11:04   CT CHEST ABDOMEN PELVIS W CONTRAST Result Date: 09/19/2023 CLINICAL DATA:  Sepsis. c/o cough, congestion, fever and flu-like symptoms since Saturday EXAM: CT CHEST, ABDOMEN, AND PELVIS WITH CONTRAST TECHNIQUE: Multidetector CT imaging of the chest, abdomen and pelvis was performed following the standard protocol during bolus administration of intravenous contrast. RADIATION DOSE REDUCTION: This exam was performed according to the departmental dose-optimization program which includes automated exposure control, adjustment of the mA and/or kV according to patient size and/or use of iterative reconstruction technique. CONTRAST:  OMNIPAQUE IOHEXOL 300 MG/ML  SOLN COMPARISON:  Chest x-ray 09/19/2023, CT abdomen pelvis 04/22/2023, CT abdomen pelvis 04/04/2023, CT abdomen pelvis 10/03/2021 FINDINGS: CT CHEST FINDINGS Cardiovascular: Mildly enlarged right heart size. No significant pericardial effusion. The thoracic aorta is normal in caliber. Mild atherosclerotic plaque of the thoracic aorta. Four-vessel coronary artery calcifications. Aortic valve leaflet calcification. Mediastinum/Nodes: No enlarged mediastinal, hilar, or axillary lymph nodes. Thyroid gland, trachea, and esophagus demonstrate no significant findings. Lungs/Pleura: Bilateral lower lobe passive atelectasis. Almost complete collapse of the right lower lobe. No focal consolidation. No pulmonary nodule. No pulmonary mass. Interval increase in size of a moderate right and small left pleural effusion. No pneumothorax.  Musculoskeletal: No chest wall abnormality.  Bilateral gynecomastia. No suspicious lytic or blastic osseous lesions. No acute displaced fracture. Multilevel degenerative changes of the spine. CT ABDOMEN PELVIS FINDINGS Hepatobiliary: Nodular hepatic contour. No focal liver abnormality. Status post cholecystectomy. No biliary dilatation. Pancreas: Diffusely atrophic. No focal lesion. Otherwise normal pancreatic contour. No surrounding inflammatory changes. No main pancreatic ductal dilatation. Spleen: Normal in size without focal abnormality. Adrenals/Urinary Tract: No adrenal nodule bilaterally. Bilateral kidneys enhance symmetrically. Question mild left urothelial thickening. No hydronephrosis. No hydroureter.  No nephroureterolithiasis. The urinary bladder is unremarkable. On delayed imaging, there is no urothelial wall thickening and there are no filling defects in the opacified portions of the bilateral collecting systems or ureters. 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 Rectal surgical changes with Redemonstration of extensive presacral mesenteric fat stranding and soft tissue density. Similar orientation of several loops of the small bowel within the left anterior abdomen suggestive of possible underlying adhesions. No associated obstruction. Stomach is within normal limits. Majority of the small bowel is decompressed. No evidence of bowel wall thickening or dilatation. No pneumatosis. Appendix appears normal. Vascular/Lymphatic: Recanalized paraumbilical vein. Paraesophageal varices. The portal, splenic, superior mesenteric veins are patent. No abdominal aorta or iliac aneurysm. Mild atherosclerotic plaque of the aorta and its branches. No abdominal, pelvic, or inguinal lymphadenopathy. Reproductive: Prostate is unremarkable. Other: Grossly stable small volume simple free fluid ascites. Associated diffuse mild mesenteric edema. No intraperitoneal free gas. No organized fluid  collection. Musculoskeletal: No abdominal wall hernia or abnormality. No suspicious lytic or blastic osseous lesions.  No acute displaced fracture. Multilevel degenerative changes of the spine. IMPRESSION: 1. Interval increase in size of a moderate right and small left pleural effusion. Almost complete collapse of the right lower lobe. 2. Mild right cardiomegaly. 3. Question mild left urothelial thickening. Correlate with urinalysis for infection. 4. Cirrhosis with portal hypertension. No focal liver lesions identified. Please note that liver protocol enhanced MR and CT are the most sensitive tests for the screening detection of hepatocellular carcinoma in the high risk setting of cirrhosis. 5. Grossly stable small volume simple free fluid ascites. 6. 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 Rectal surgical changes with Redemonstration of extensive presacral mesenteric fat stranding and soft tissue density. Markedly limited evaluation of this region. 7. Aortic Atherosclerosis (ICD10-I70.0) including four-vessel coronary artery and aortic valve leaflet calcifications-correlate for aortic stenosis. Electronically Signed   By: Tish Frederickson M.D.   On: 09/19/2023 19:39   DG Chest 2 View Result Date: 09/19/2023 CLINICAL DATA:  Cough EXAM: CHEST - 2 VIEW COMPARISON:  06/08/2023 FINDINGS: Underinflation. Enlarged cardiopericardial silhouette. Central vascular prominence. Developing bilateral small pleural effusion with adjacent opacity, right-greater-than-left. Recommend follow-up. No pneumothorax. Left upper chest subclavian port. Tip overlying the central SVC. Degenerative changes of the spine. IMPRESSION: Developing pleural effusions with adjacent opacity, right-greater-than-left. Recommend follow-up. Persistent enlarged cardiac silhouette with vascular congestion. Chest port Electronically Signed   By: Karen Kays M.D.   On: 09/19/2023 17:39     Subjective: Pt says he is feeling much better.  He is  not having chills or rigors anymore and he has been ambulating in room and not having shortness of breath.   Discharge Exam: Vitals:   09/25/23 2101 09/26/23 0500  BP: (!) 125/52 (!) 107/55  Pulse: (!) 58 (!) 56  Resp: 16 17  Temp: 97.8 F (36.6 C) 98.1 F (36.7 C)  SpO2: 99% 100%   Vitals:   09/25/23 0827 09/25/23 1321 09/25/23 2101 09/26/23 0500  BP:  134/63 (!) 125/52 (!) 107/55  Pulse: (!) 50 63 (!) 58 (!) 56  Resp:   16 17  Temp:   97.8 F (36.6 C) 98.1 F (36.7 C)  TempSrc:   Oral Oral  SpO2:  100% 99% 100%  Weight:      Height:       General: Pt is alert, awake, not in acute distress Cardiovascular: normal S1/S2 +, no rubs, no gallops Respiratory: CTA bilaterally, no wheezing, no rhonchi Abdominal: Soft, NT, ND, bowel sounds + Extremities: no edema, no cyanosis   The results of significant diagnostics from this hospitalization (including imaging, microbiology, ancillary and laboratory) are listed below for reference.     Microbiology: Recent Results (from the past 240 hours)  Resp panel by RT-PCR (RSV, Flu A&B, Covid) Anterior Nasal Swab     Status: None   Collection Time: 09/19/23  2:34 PM   Specimen: Anterior Nasal Swab  Result Value Ref Range Status   SARS Coronavirus 2 by RT PCR NEGATIVE NEGATIVE Final    Comment: (NOTE) SARS-CoV-2 target nucleic acids are NOT DETECTED.  The SARS-CoV-2 RNA is generally detectable in upper respiratory specimens during the acute phase of infection. The lowest concentration of SARS-CoV-2 viral copies this assay can detect is 138 copies/mL. A negative result does not preclude SARS-Cov-2 infection and should not be used as the sole basis for treatment or other patient management decisions. A negative result may occur with  improper specimen collection/handling, submission of specimen other than nasopharyngeal swab, presence of viral mutation(s)  within the areas targeted by this assay, and inadequate number of  viral copies(<138 copies/mL). A negative result must be combined with clinical observations, patient history, and epidemiological information. The expected result is Negative.  Fact Sheet for Patients:  BloggerCourse.com  Fact Sheet for Healthcare Providers:  SeriousBroker.it  This test is no t yet approved or cleared by the [place] FDA and  has been authorized for detection and/or diagnosis of SARS-CoV-2 by FDA under an Emergency Use Authorization (EUA). This EUA will remain  in effect (meaning this test can be used) for the duration of the COVID-19 declaration under Section 564(b)(1) of the Act, 21 U.S.C.section 360bbb-3(b)(1), unless the authorization is terminated  or revoked sooner.       Influenza A by PCR NEGATIVE NEGATIVE Final   Influenza B by PCR NEGATIVE NEGATIVE Final    Comment: (NOTE) The Xpert Xpress SARS-CoV-2/FLU/RSV plus assay is intended as an aid in the diagnosis of influenza from Nasopharyngeal swab specimens and should not be used as a sole basis for treatment. Nasal washings and aspirates are unacceptable for Xpert Xpress SARS-CoV-2/FLU/RSV testing.  Fact Sheet for Patients: BloggerCourse.com  Fact Sheet for Healthcare Providers: SeriousBroker.it  This test is not yet approved or cleared by the [place] FDA and has been authorized for detection and/or diagnosis of SARS-CoV-2 by FDA under an Emergency Use Authorization (EUA). This EUA will remain in effect (meaning this test can be used) for the duration of the COVID-19 declaration under Section 564(b)(1) of the Act, 21 U.S.C. section 360bbb-3(b)(1), unless the authorization is terminated or revoked.     Resp Syncytial Virus by PCR NEGATIVE NEGATIVE Final    Comment: (NOTE) Fact Sheet for Patients: BloggerCourse.com  Fact Sheet for Healthcare  Providers: SeriousBroker.it  This test is not yet approved or cleared by the [place] FDA and has been authorized for detection and/or diagnosis of SARS-CoV-2 by FDA under an Emergency Use Authorization (EUA). This EUA will remain in effect (meaning this test can be used) for the duration of the COVID-19 declaration under Section 564(b)(1) of the Act, 21 U.S.C. section 360bbb-3(b)(1), unless the authorization is terminated or revoked.  Performed at [facility], [address] [place], [place] [address]   Blood culture (routine x 2)     Status: Abnormal (Preliminary result)   Collection Time: 09/19/23  4:05 PM   Specimen: BLOOD  Result Value Ref Range Status   Specimen Description   Final    BLOOD RIGHT ANTECUBITAL Performed at [facility], [address], [place], [place] [address]    Special Requests   Final    BOTTLES DRAWN AEROBIC AND ANAEROBIC Blood Culture results may not be optimal due to an inadequate volume of blood received in culture bottles Performed at [facility], [address] [place], [place] [address]    Culture  Setup Time   Final    GRAM POSITIVE COCCI IN BOTH AEROBIC AND ANAEROBIC BOTTLES Gram Stain Report Called to,Read Back By and Verified With: [facility] WHITE AT 0919 09/20/2023 BY A. SNYDER CRITICAL RESULT CALLED TO, READ BACK BY AND VERIFIED WITH: PHARMD LORI POOLE ON 09/20/23 @ 1332 BY DRT    Culture (A)  Final    STAPHYLOCOCCUS LUGDUNENSIS Sent to [facility] for further susceptibility testing. Performed at [facility], [address], [place], [place] [address]    Report Status PENDING  Incomplete   Organism ID, Bacteria STAPHYLOCOCCUS LUGDUNENSIS  Final      Susceptibility   Staphylococcus  lugdunensis - MIC*    CIPROFLOXACIN <=0.5 SENSITIVE Sensitive     ERYTHROMYCIN >=8 RESISTANT Resistant     GENTAMICIN <=0.5 SENSITIVE Sensitive     OXACILLIN >=4 RESISTANT Resistant     TETRACYCLINE <=1 SENSITIVE Sensitive      VANCOMYCIN 1 SENSITIVE Sensitive     TRIMETH/SULFA <=10 SENSITIVE Sensitive     CLINDAMYCIN INTERMEDIATE Intermediate     RIFAMPIN <=0.5 SENSITIVE Sensitive     Inducible Clindamycin NEGATIVE Sensitive     * STAPHYLOCOCCUS LUGDUNENSIS  Blood Culture ID Panel (Reflexed)     Status: Abnormal   Collection Time: 09/19/23  4:05 PM  Result Value Ref Range Status   Enterococcus faecalis NOT DETECTED NOT DETECTED Final   Enterococcus Faecium NOT DETECTED NOT DETECTED Final   Listeria monocytogenes NOT DETECTED NOT DETECTED Final   Staphylococcus species DETECTED (A) NOT DETECTED Final    Comment: CRITICAL RESULT CALLED TO, READ BACK BY AND VERIFIED WITH: PHARMD LORI POOLE ON 09/20/23 @ 1332 BY DRT    Staphylococcus aureus (BCID) NOT DETECTED NOT DETECTED Final   Staphylococcus epidermidis NOT DETECTED NOT DETECTED Final   Staphylococcus lugdunensis DETECTED (A) NOT DETECTED Final    Comment: Methicillin (oxacillin) resistant coagulase negative staphylococcus. Possible blood culture contaminant (unless isolated from more than one blood culture draw or clinical case suggests pathogenicity). No antibiotic treatment is indicated for blood  culture contaminants. CRITICAL RESULT CALLED TO, READ BACK BY AND VERIFIED WITH: PHARMD LORI POOLE ON 09/20/23 @ 1332 BY DRT    Streptococcus species NOT DETECTED NOT DETECTED Final   Streptococcus agalactiae NOT DETECTED NOT DETECTED Final   Streptococcus pneumoniae NOT DETECTED NOT DETECTED Final   Streptococcus pyogenes NOT DETECTED NOT DETECTED Final   A.calcoaceticus-baumannii NOT DETECTED NOT DETECTED Final   Bacteroides fragilis NOT DETECTED NOT DETECTED Final   Enterobacterales NOT DETECTED NOT DETECTED Final   Enterobacter cloacae complex NOT DETECTED NOT DETECTED Final   Escherichia coli NOT DETECTED NOT DETECTED Final   Klebsiella aerogenes NOT DETECTED NOT DETECTED Final   Klebsiella oxytoca NOT DETECTED NOT DETECTED Final   Klebsiella pneumoniae  NOT DETECTED NOT DETECTED Final   Proteus species NOT DETECTED NOT DETECTED Final   Salmonella species NOT DETECTED NOT DETECTED Final   Serratia marcescens NOT DETECTED NOT DETECTED Final   Haemophilus influenzae NOT DETECTED NOT DETECTED Final   Neisseria meningitidis NOT DETECTED NOT DETECTED Final   Pseudomonas aeruginosa NOT DETECTED NOT DETECTED Final   Stenotrophomonas maltophilia NOT DETECTED NOT DETECTED Final   Candida albicans NOT DETECTED NOT DETECTED Final   Candida auris NOT DETECTED NOT DETECTED Final   Candida glabrata NOT DETECTED NOT DETECTED Final   Candida krusei NOT DETECTED NOT DETECTED Final   Candida parapsilosis NOT DETECTED NOT DETECTED Final   Candida tropicalis NOT DETECTED NOT DETECTED Final   Cryptococcus neoformans/gattii NOT DETECTED NOT DETECTED Final   Methicillin resistance mecA/C DETECTED (A) NOT DETECTED Final    Comment: CRITICAL RESULT CALLED TO, READ BACK BY AND VERIFIED WITH: PHARMD LORI POOLE ON 09/20/23 @ 1332 BY DRT Performed at [facility], [address], [place], [place] [address]   Blood culture (routine x 2)     Status: Abnormal   Collection Time: 09/19/23  4:40 PM   Specimen: BLOOD  Result Value Ref Range Status   Specimen Description   Final    BLOOD LEFT ANTECUBITAL Performed at [facility], [address] [place], [place] [address]    Special  Requests   Final    BOTTLES DRAWN AEROBIC AND ANAEROBIC Blood Culture adequate volume Performed at [facility], [address], [place], [place] [address]    Culture  Setup Time   Final    GRAM POSITIVE COCCI IN BOTH AEROBIC AND ANAEROBIC BOTTLES Gram Stain Report Called to,Read Back By and Verified With: MEGAN WHITE, RN AT [phone] 09/20/2023 BY Kandice Moos Performed at [facility], [address], [place], [place] [address]    Culture (A)  Final    STAPHYLOCOCCUS LUGDUNENSIS SUSCEPTIBILITIES PERFORMED ON PREVIOUS CULTURE WITHIN THE LAST 5 DAYS. Performed at [facility],  [address], [place], [place] [address]    Report Status 09/22/2023 FINAL  Final  Respiratory (~20 pathogens) panel by PCR     Status: None   Collection Time: 09/20/23  6:53 AM   Specimen: Nasopharyngeal Swab; Respiratory  Result Value Ref Range Status   Adenovirus NOT DETECTED NOT DETECTED Final   Coronavirus 229E NOT DETECTED NOT DETECTED Final    Comment: (NOTE) The Coronavirus on the Respiratory Panel, DOES NOT test for the novel  Coronavirus (2019 nCoV)    Coronavirus HKU1 NOT DETECTED NOT DETECTED Final   Coronavirus NL63 NOT DETECTED NOT DETECTED Final   Coronavirus OC43 NOT DETECTED NOT DETECTED Final   Metapneumovirus NOT DETECTED NOT DETECTED Final   Rhinovirus / Enterovirus NOT DETECTED NOT DETECTED Final   Influenza A NOT DETECTED NOT DETECTED Final   Influenza B NOT DETECTED NOT DETECTED Final   Parainfluenza Virus 1 NOT DETECTED NOT DETECTED Final   Parainfluenza Virus 2 NOT DETECTED NOT DETECTED Final   Parainfluenza Virus 3 NOT DETECTED NOT DETECTED Final   Parainfluenza Virus 4 NOT DETECTED NOT DETECTED Final   Respiratory Syncytial Virus NOT DETECTED NOT DETECTED Final   Bordetella pertussis NOT DETECTED NOT DETECTED Final   Bordetella Parapertussis NOT DETECTED NOT DETECTED Final   Chlamydophila pneumoniae NOT DETECTED NOT DETECTED Final   Mycoplasma pneumoniae NOT DETECTED NOT DETECTED Final    Comment: Performed at [facility], [address] [place], [place] [address]  Culture, blood (Routine X 2) w Reflex to ID Panel     Status: None   Collection Time: 09/20/23  3:45 PM   Specimen: Left Antecubital; Blood  Result Value Ref Range Status   Specimen Description   Final    LEFT ANTECUBITAL BOTTLES DRAWN AEROBIC AND ANAEROBIC   Special Requests Blood Culture adequate volume  Final   Culture   Final    NO GROWTH 5 DAYS Performed at [facility], [address], [place], [place] [address]    Report Status 09/25/2023 FINAL  Final  Culture, blood (Routine  X 2) w Reflex to ID Panel     Status: None   Collection Time: 09/20/23  3:46 PM   Specimen: Right Antecubital; Blood  Result Value Ref Range Status   Specimen Description   Final    RIGHT ANTECUBITAL BOTTLES DRAWN AEROBIC AND ANAEROBIC   Special Requests Blood Culture adequate volume  Final   Culture   Final    NO GROWTH 5 DAYS Performed at [facility], [address], [place], [place] [address]    Report Status 09/25/2023 FINAL  Final  Gram stain     Status: None   Collection Time: 09/21/23 10:20 AM   Specimen: Pleura  Result Value Ref Range Status   Specimen Description PLEURAL  Final   Special Requests NONE  Final   Gram  Stain   Final    WBC PRESENT, PREDOMINANTLY MONONUCLEAR NO ORGANISMS SEEN CYTOSPIN SMEAR Performed at [facility], [address], [place], [place] [address]    Report Status 09/21/2023 FINAL  Final  Culture, body fluid w Gram Stain-bottle     Status: None   Collection Time: 09/21/23 10:20 AM   Specimen: Pleura  Result Value Ref Range Status   Specimen Description PLEURAL  Final   Special Requests BOTTLES DRAWN AEROBIC AND ANAEROBIC 10 CC  Final   Culture   Final    NO GROWTH 5 DAYS Performed at [facility], [address] [place], [place] [address]    Report Status 09/26/2023 FINAL  Final  Cath Tip Culture     Status: Abnormal (Preliminary result)   Collection Time: 09/23/23  3:55 PM   Specimen: Catheter Tip  Result Value Ref Range Status   Specimen Description   Final    CATH TIP Performed at [facility], [address] [place], [place] [address]    Special Requests   Final    NONE Performed at [facility], [address] [place], [place] [address]    Culture (A)  Final    1,000 COLONIES/mL STAPHYLOCOCCUS EPIDERMIDIS SUSCEPTIBILITIES TO FOLLOW Performed at [facility], [address], [place], [place] [address]    Report Status PENDING  Incomplete  Culture, blood (Routine X 2) w Reflex to ID Panel     Status: None (Preliminary  result)   Collection Time: 09/23/23 10:08 PM   Specimen: Right Antecubital; Blood  Result Value Ref Range Status   Specimen Description RIGHT ANTECUBITAL  Final   Special Requests   Final    BOTTLES DRAWN AEROBIC AND ANAEROBIC Blood Culture adequate volume   Culture   Final    NO GROWTH 3 DAYS Performed at [facility], [address], [place], [place] [address]    Report Status PENDING  Incomplete  Culture, blood (Routine X 2) w Reflex to ID Panel     Status: None (Preliminary result)   Collection Time: 09/23/23 10:09 PM   Specimen: Left Antecubital  Result Value Ref Range Status   Specimen Description LEFT ANTECUBITAL  Final   Special Requests   Final    BOTTLES DRAWN AEROBIC AND ANAEROBIC Blood Culture adequate volume   Culture   Final    NO GROWTH 3 DAYS Performed at [facility], [address], [place], [place] [address]    Report Status PENDING  Incomplete     Labs: BNP (last 3 results) No results for input(s): "BNP" in the last 8760 hours. Basic Metabolic Panel: Recent Labs  Lab 09/19/23 1547 09/20/23 0515 09/21/23 0512 09/22/23 0340 09/23/23 0437 09/26/23 0455  NA 132* 129* 130* 129* 131*  --   K 4.4 4.6 4.9 4.2 4.1  --   CL 102 102 102 102 104  --   CO2 21* 21* 25 24 24   --   GLUCOSE 119* 137* 142* 132* 146*  --   BUN 20 24* 31* 25* 19  --   CREATININE 1.11 1.19 1.33* 1.20 1.14 0.98  CALCIUM 8.6* 7.9* 8.2* 8.0* 7.8*  --   MG  --  1.7  --  1.9  --   --   PHOS  --  3.0  --   --   --   --    Liver Function Tests: Recent Labs  Lab 09/19/23 1547 09/20/23 0515 09/21/23 0512  AST 63* 57* 43*  ALT  37 33 26  ALKPHOS 99 83 88  BILITOT 5.8* 4.0* 2.1*  PROT 7.1 6.1* 5.9*  ALBUMIN 2.5* 2.1* 2.0*   No results for input(s): "LIPASE", "AMYLASE" in the last 168 hours. No results for input(s): "AMMONIA" in the last 168 hours. CBC: Recent Labs  Lab 09/19/23 1547 09/20/23 0515 09/21/23 0512 09/22/23 0340 09/23/23 0437 09/24/23 0452  WBC 11.6* 10.8* 5.8 5.6  4.7 5.2  NEUTROABS 10.4*  --  3.6 3.1 2.5 2.9  HGB 11.0* 9.4* 9.5* 9.5* 9.7* 9.5*  HCT 33.6* 28.0* 28.1* 27.7* 28.1* 28.7*  MCV 111.3* 110.7* 111.1* 109.1* 107.3* 110.0*  PLT 60* 58* 53* 56* 56* 60*   Cardiac Enzymes: Recent Labs  Lab 09/24/23 0452  CKTOTAL 49   BNP: Invalid input(s): "POCBNP" CBG: Recent Labs  Lab 09/23/23 1340  GLUCAP 87   D-Dimer No results for input(s): "DDIMER" in the last 72 hours. Hgb A1c No results for input(s): "HGBA1C" in the last 72 hours. Lipid Profile No results for input(s): "CHOL", "HDL", "LDLCALC", "TRIG", "CHOLHDL", "LDLDIRECT" in the last 72 hours. Thyroid function studies No results for input(s): "TSH", "T4TOTAL", "T3FREE", "THYROIDAB" in the last 72 hours.  Invalid input(s): "FREET3" Anemia work up No results for input(s): "VITAMINB12", "FOLATE", "FERRITIN", "TIBC", "IRON", "RETICCTPCT" in the last 72 hours. Urinalysis    Component Value Date/Time   COLORURINE AMBER (A) 09/19/2023 1810   APPEARANCEUR CLEAR 09/19/2023 1810   APPEARANCEUR Clear 08/30/2023 1409   LABSPEC 1.023 09/19/2023 1810   PHURINE 5.0 09/19/2023 1810   GLUCOSEU NEGATIVE 09/19/2023 1810   HGBUR NEGATIVE 09/19/2023 1810   BILIRUBINUR NEGATIVE 09/19/2023 1810   BILIRUBINUR Negative 08/30/2023 1409   KETONESUR NEGATIVE 09/19/2023 1810   PROTEINUR 30 (A) 09/19/2023 1810   NITRITE NEGATIVE 09/19/2023 1810   LEUKOCYTESUR NEGATIVE 09/19/2023 1810   Sepsis Labs Recent Labs  Lab 09/21/23 0512 09/22/23 0340 09/23/23 0437 09/24/23 0452  WBC 5.8 5.6 4.7 5.2   Microbiology Recent Results (from the past 240 hours)  Resp panel by RT-PCR (RSV, Flu A&B, Covid) Anterior Nasal Swab     Status: None   Collection Time: 09/19/23  2:34 PM   Specimen: Anterior Nasal Swab  Result Value Ref Range Status   SARS Coronavirus 2 by RT PCR NEGATIVE NEGATIVE Final    Comment: (NOTE) SARS-CoV-2 target nucleic acids are NOT DETECTED.  The SARS-CoV-2 RNA is generally detectable  in upper respiratory specimens during the acute phase of infection. The lowest concentration of SARS-CoV-2 viral copies this assay can detect is 138 copies/mL. A negative result does not preclude SARS-Cov-2 infection and should not be used as the sole basis for treatment or other patient management decisions. A negative result may occur with  improper specimen collection/handling, submission of specimen other than nasopharyngeal swab, presence of viral mutation(s) within the areas targeted by this assay, and inadequate number of viral copies(<138 copies/mL). A negative result must be combined with clinical observations, patient history, and epidemiological information. The expected result is Negative.  Fact Sheet for Patients:  BloggerCourse.com  Fact Sheet for Healthcare Providers:  SeriousBroker.it  This test is no t yet approved or cleared by the [place] FDA and  has been authorized for detection and/or diagnosis of SARS-CoV-2 by FDA under an Emergency Use Authorization (EUA). This EUA will remain  in effect (meaning this test can be used) for the duration of the COVID-19 declaration under Section 564(b)(1) of the Act, 21 U.S.C.section 360bbb-3(b)(1), unless the authorization is terminated  or revoked sooner.  Influenza A by PCR NEGATIVE NEGATIVE Final   Influenza B by PCR NEGATIVE NEGATIVE Final    Comment: (NOTE) The Xpert Xpress SARS-CoV-2/FLU/RSV plus assay is intended as an aid in the diagnosis of influenza from Nasopharyngeal swab specimens and should not be used as a sole basis for treatment. Nasal washings and aspirates are unacceptable for Xpert Xpress SARS-CoV-2/FLU/RSV testing.  Fact Sheet for Patients: BloggerCourse.com  Fact Sheet for Healthcare Providers: SeriousBroker.it  This test is not yet approved or cleared by the [place] FDA and has  been authorized for detection and/or diagnosis of SARS-CoV-2 by FDA under an Emergency Use Authorization (EUA). This EUA will remain in effect (meaning this test can be used) for the duration of the COVID-19 declaration under Section 564(b)(1) of the Act, 21 U.S.C. section 360bbb-3(b)(1), unless the authorization is terminated or revoked.     Resp Syncytial Virus by PCR NEGATIVE NEGATIVE Final    Comment: (NOTE) Fact Sheet for Patients: BloggerCourse.com  Fact Sheet for Healthcare Providers: SeriousBroker.it  This test is not yet approved or cleared by the [place] FDA and has been authorized for detection and/or diagnosis of SARS-CoV-2 by FDA under an Emergency Use Authorization (EUA). This EUA will remain in effect (meaning this test can be used) for the duration of the COVID-19 declaration under Section 564(b)(1) of the Act, 21 U.S.C. section 360bbb-3(b)(1), unless the authorization is terminated or revoked.  Performed at [facility], [address], [place], [place] [address]   Blood culture (routine x 2)     Status: Abnormal (Preliminary result)   Collection Time: 09/19/23  4:05 PM   Specimen: BLOOD  Result Value Ref Range Status   Specimen Description   Final    BLOOD RIGHT ANTECUBITAL Performed at [facility], [address], [place], [place] [address]    Special Requests   Final    BOTTLES DRAWN AEROBIC AND ANAEROBIC Blood Culture results may not be optimal due to an inadequate volume of blood received in culture bottles Performed at [facility], [address], [place], [place] [address]    Culture  Setup Time   Final    GRAM POSITIVE COCCI IN BOTH AEROBIC AND ANAEROBIC BOTTLES Gram Stain Report Called to,Read Back By and Verified With: [facility] WHITE AT 0919 09/20/2023 BY A. SNYDER CRITICAL RESULT CALLED TO, READ BACK BY AND VERIFIED WITH: PHARMD LORI POOLE ON 09/20/23 @ 1332 BY DRT    Culture (A)  Final     STAPHYLOCOCCUS LUGDUNENSIS Sent to [facility] for further susceptibility testing. Performed at [facility], [address] [place], [place] [address]    Report Status PENDING  Incomplete   Organism ID, Bacteria STAPHYLOCOCCUS LUGDUNENSIS  Final      Susceptibility   Staphylococcus lugdunensis - MIC*    CIPROFLOXACIN <=0.5 SENSITIVE Sensitive     ERYTHROMYCIN >=8 RESISTANT Resistant     GENTAMICIN <=0.5 SENSITIVE Sensitive     OXACILLIN >=4 RESISTANT Resistant     TETRACYCLINE <=1 SENSITIVE Sensitive     VANCOMYCIN 1 SENSITIVE Sensitive     TRIMETH/SULFA <=10 SENSITIVE Sensitive     CLINDAMYCIN INTERMEDIATE Intermediate     RIFAMPIN <=0.5 SENSITIVE Sensitive     Inducible Clindamycin NEGATIVE Sensitive     * STAPHYLOCOCCUS LUGDUNENSIS  Blood Culture ID Panel (Reflexed)     Status: Abnormal   Collection Time: 09/19/23  4:05 PM  Result Value Ref Range Status   Enterococcus faecalis NOT DETECTED NOT DETECTED Final   Enterococcus  Faecium NOT DETECTED NOT DETECTED Final   Listeria monocytogenes NOT DETECTED NOT DETECTED Final   Staphylococcus species DETECTED (A) NOT DETECTED Final    Comment: CRITICAL RESULT CALLED TO, READ BACK BY AND VERIFIED WITH: PHARMD LORI POOLE ON 09/20/23 @ 1332 BY DRT    Staphylococcus aureus (BCID) NOT DETECTED NOT DETECTED Final   Staphylococcus epidermidis NOT DETECTED NOT DETECTED Final   Staphylococcus lugdunensis DETECTED (A) NOT DETECTED Final    Comment: Methicillin (oxacillin) resistant coagulase negative staphylococcus. Possible blood culture contaminant (unless isolated from more than one blood culture draw or clinical case suggests pathogenicity). No antibiotic treatment is indicated for blood  culture contaminants. CRITICAL RESULT CALLED TO, READ BACK BY AND VERIFIED WITH: PHARMD LORI POOLE ON 09/20/23 @ 1332 BY DRT    Streptococcus species NOT DETECTED NOT DETECTED Final   Streptococcus agalactiae NOT DETECTED NOT DETECTED Final    Streptococcus pneumoniae NOT DETECTED NOT DETECTED Final   Streptococcus pyogenes NOT DETECTED NOT DETECTED Final   A.calcoaceticus-baumannii NOT DETECTED NOT DETECTED Final   Bacteroides fragilis NOT DETECTED NOT DETECTED Final   Enterobacterales NOT DETECTED NOT DETECTED Final   Enterobacter cloacae complex NOT DETECTED NOT DETECTED Final   Escherichia coli NOT DETECTED NOT DETECTED Final   Klebsiella aerogenes NOT DETECTED NOT DETECTED Final   Klebsiella oxytoca NOT DETECTED NOT DETECTED Final   Klebsiella pneumoniae NOT DETECTED NOT DETECTED Final   Proteus species NOT DETECTED NOT DETECTED Final   Salmonella species NOT DETECTED NOT DETECTED Final   Serratia marcescens NOT DETECTED NOT DETECTED Final   Haemophilus influenzae NOT DETECTED NOT DETECTED Final   Neisseria meningitidis NOT DETECTED NOT DETECTED Final   Pseudomonas aeruginosa NOT DETECTED NOT DETECTED Final   Stenotrophomonas maltophilia NOT DETECTED NOT DETECTED Final   Candida albicans NOT DETECTED NOT DETECTED Final   Candida auris NOT DETECTED NOT DETECTED Final   Candida glabrata NOT DETECTED NOT DETECTED Final   Candida krusei NOT DETECTED NOT DETECTED Final   Candida parapsilosis NOT DETECTED NOT DETECTED Final   Candida tropicalis NOT DETECTED NOT DETECTED Final   Cryptococcus neoformans/gattii NOT DETECTED NOT DETECTED Final   Methicillin resistance mecA/C DETECTED (A) NOT DETECTED Final    Comment: CRITICAL RESULT CALLED TO, READ BACK BY AND VERIFIED WITH: PHARMD LORI POOLE ON 09/20/23 @ 1332 BY DRT Performed at [facility], [address], [place], [place] [address]   Blood culture (routine x 2)     Status: Abnormal   Collection Time: 09/19/23  4:40 PM   Specimen: BLOOD  Result Value Ref Range Status   Specimen Description   Final    BLOOD LEFT ANTECUBITAL Performed at [facility], [address] [place], [place] [address]    Special Requests   Final    BOTTLES DRAWN AEROBIC AND ANAEROBIC Blood  Culture adequate volume Performed at [facility], [address], [place], [place] [address]    Culture  Setup Time   Final    GRAM POSITIVE COCCI IN BOTH AEROBIC AND ANAEROBIC BOTTLES Gram Stain Report Called to,Read Back By and Verified With: MEGAN WHITE, RN AT [phone] 09/20/2023 BY Kandice Moos Performed at [facility], [address], [place], [place] [address]    Culture (A)  Final    STAPHYLOCOCCUS LUGDUNENSIS SUSCEPTIBILITIES PERFORMED ON PREVIOUS CULTURE WITHIN THE LAST 5 DAYS. Performed at [facility], [address], [place], [place] [address]    Report Status 09/22/2023 FINAL  Final  Respiratory (~20  pathogens) panel by PCR     Status: None   Collection Time: 09/20/23  6:53 AM   Specimen: Nasopharyngeal Swab; Respiratory  Result Value Ref Range Status   Adenovirus NOT DETECTED NOT DETECTED Final   Coronavirus 229E NOT DETECTED NOT DETECTED Final    Comment: (NOTE) The Coronavirus on the Respiratory Panel, DOES NOT test for the novel  Coronavirus (2019 nCoV)    Coronavirus HKU1 NOT DETECTED NOT DETECTED Final   Coronavirus NL63 NOT DETECTED NOT DETECTED Final   Coronavirus OC43 NOT DETECTED NOT DETECTED Final   Metapneumovirus NOT DETECTED NOT DETECTED Final   Rhinovirus / Enterovirus NOT DETECTED NOT DETECTED Final   Influenza A NOT DETECTED NOT DETECTED Final   Influenza B NOT DETECTED NOT DETECTED Final   Parainfluenza Virus 1 NOT DETECTED NOT DETECTED Final   Parainfluenza Virus 2 NOT DETECTED NOT DETECTED Final   Parainfluenza Virus 3 NOT DETECTED NOT DETECTED Final   Parainfluenza Virus 4 NOT DETECTED NOT DETECTED Final   Respiratory Syncytial Virus NOT DETECTED NOT DETECTED Final   Bordetella pertussis NOT DETECTED NOT DETECTED Final   Bordetella Parapertussis NOT DETECTED NOT DETECTED Final   Chlamydophila pneumoniae NOT DETECTED NOT DETECTED Final   Mycoplasma pneumoniae NOT DETECTED NOT DETECTED Final    Comment: Performed at [facility], [address] [place], [place] [address]  Culture, blood (Routine X 2) w Reflex to ID Panel     Status: None   Collection Time: 09/20/23  3:45 PM   Specimen: Left Antecubital; Blood  Result Value Ref Range Status   Specimen Description   Final    LEFT ANTECUBITAL BOTTLES DRAWN AEROBIC AND ANAEROBIC   Special Requests Blood Culture adequate volume  Final   Culture   Final    NO GROWTH 5 DAYS Performed at [facility], [address], [place], [place] [address]    Report Status 09/25/2023 FINAL  Final  Culture, blood (Routine X 2) w Reflex to ID Panel     Status: None   Collection Time: 09/20/23  3:46 PM   Specimen: Right Antecubital; Blood  Result Value Ref Range Status   Specimen Description   Final    RIGHT ANTECUBITAL BOTTLES DRAWN AEROBIC AND ANAEROBIC   Special Requests Blood Culture adequate volume  Final   Culture   Final    NO GROWTH 5 DAYS Performed at [facility], [address] [place], [place] [address]    Report Status 09/25/2023 FINAL  Final  Gram stain     Status: None   Collection Time: 09/21/23 10:20 AM   Specimen: Pleura  Result Value Ref Range Status   Specimen Description PLEURAL  Final   Special Requests NONE  Final   Gram Stain   Final    WBC PRESENT, PREDOMINANTLY MONONUCLEAR NO ORGANISMS SEEN CYTOSPIN SMEAR Performed at [facility], [address] [place], [place] [address]    Report Status 09/21/2023 FINAL  Final  Culture, body fluid w Gram Stain-bottle     Status: None   Collection Time: 09/21/23 10:20 AM   Specimen: Pleura  Result Value Ref Range Status   Specimen Description PLEURAL  Final   Special Requests BOTTLES DRAWN AEROBIC AND ANAEROBIC 10 CC  Final   Culture   Final    NO GROWTH 5 DAYS Performed at [facility], [address], [place], [place] [address]    Report Status 09/26/2023 FINAL  Final  Cath Tip Culture  Status: Abnormal (Preliminary result)   Collection Time: 09/23/23  3:55 PM   Specimen: Catheter Tip  Result Value Ref Range  Status   Specimen Description   Final    CATH TIP Performed at [facility], [address] [place], [place] [address]    Special Requests   Final    NONE Performed at [facility], [address], [place], [place] [address]    Culture (A)  Final    1,000 COLONIES/mL STAPHYLOCOCCUS EPIDERMIDIS SUSCEPTIBILITIES TO FOLLOW Performed at [facility], [address] [place], [place] [address]    Report Status PENDING  Incomplete  Culture, blood (Routine X 2) w Reflex to ID Panel     Status: None (Preliminary result)   Collection Time: 09/23/23 10:08 PM   Specimen: Right Antecubital; Blood  Result Value Ref Range Status   Specimen Description RIGHT ANTECUBITAL  Final   Special Requests   Final    BOTTLES DRAWN AEROBIC AND ANAEROBIC Blood Culture adequate volume   Culture   Final    NO GROWTH 3 DAYS Performed at [facility], [address], [place], [place] [address]    Report Status PENDING  Incomplete  Culture, blood (Routine X 2) w Reflex to ID Panel     Status: None (Preliminary result)   Collection Time: 09/23/23 10:09 PM   Specimen: Left Antecubital  Result Value Ref Range Status   Specimen Description LEFT ANTECUBITAL  Final   Special Requests   Final    BOTTLES DRAWN AEROBIC AND ANAEROBIC Blood Culture adequate volume   Culture   Final    NO GROWTH 3 DAYS Performed at [facility], [address], [place], [place] [address]    Report Status PENDING  Incomplete    Time coordinating discharge: 50 mins   SIGNED:  Standley Dakins, MD  [facility] 09/26/2023, 12:15 PM How to contact the [facility] Attending or Consulting provider 7A - 7P or covering provider during after hours 7P -7A, for this patient?  Check the care team in [facility] and look for a) attending/consulting TRH provider listed and b) the [facility] team listed Log into [email or web address] and use [facility]'s universal password to access. If you do not have the password, please contact the hospital operator. Locate  the [facility] provider you are looking for under [facility] and page to a number that you can be directly reached. If you still have difficulty reaching the provider, please page the [facility] (Director on Call) for the Hospitalists listed on amion for assistance.

## 2023-09-26 NOTE — Progress Notes (Signed)
Mobility Specialist Progress Note:    09/26/23 1505  Mobility  Activity Ambulated with assistance in hallway  Level of Assistance Standby assist, set-up cues, supervision of patient - no hands on  Assistive Device None  Distance Ambulated (ft) 200 ft  Range of Motion/Exercises Active;All extremities  Activity Response Tolerated well  Mobility Referral Yes  Mobility visit 1 Mobility  Mobility Specialist Start Time (ACUTE ONLY) 1505  Mobility Specialist Stop Time (ACUTE ONLY) 1525  Mobility Specialist Time Calculation (min) (ACUTE ONLY) 20 min   Pt received in bed, visitors in room. Agreeable to mobility, required supervision to stand and ambulate with no AD. Tolerated well, asx throughout. Left pt standing EOB, all needs met.  Oniya Mandarino Mobility Specialist Please contact via Special educational needs teacher or  Rehab office at [phone]

## 2023-09-26 NOTE — Progress Notes (Signed)
PHARMACY CONSULT NOTE FOR:  OUTPATIENT  PARENTERAL ANTIBIOTIC THERAPY (OPAT)  Indication: Staph lugdunensis bacteremia Regimen: Daptomycin 700 mg IV Q 24 hours  End date: 10/07/23  IV antibiotic discharge orders are pended. To discharging provider:  please sign these orders via discharge navigator,  Select New Orders & click on the button choice - Manage This Unsigned Work.     Thank you for allowing pharmacy to be a part of this patient's care.  Sharin Mons, PharmD, BCPS, BCIDP Infectious Diseases Clinical Pharmacist Phone: [phone] 09/26/2023, 10:02 AM

## 2023-09-27 DIAGNOSIS — K7460 Unspecified cirrhosis of liver: Secondary | ICD-10-CM | POA: Diagnosis not present

## 2023-09-27 DIAGNOSIS — R001 Bradycardia, unspecified: Secondary | ICD-10-CM | POA: Diagnosis not present

## 2023-09-27 DIAGNOSIS — J90 Pleural effusion, not elsewhere classified: Secondary | ICD-10-CM | POA: Diagnosis not present

## 2023-09-27 DIAGNOSIS — E1140 Type 2 diabetes mellitus with diabetic neuropathy, unspecified: Secondary | ICD-10-CM | POA: Diagnosis not present

## 2023-09-27 DIAGNOSIS — E1122 Type 2 diabetes mellitus with diabetic chronic kidney disease: Secondary | ICD-10-CM | POA: Diagnosis not present

## 2023-09-27 DIAGNOSIS — D630 Anemia in neoplastic disease: Secondary | ICD-10-CM | POA: Diagnosis not present

## 2023-09-27 DIAGNOSIS — E8809 Other disorders of plasma-protein metabolism, not elsewhere classified: Secondary | ICD-10-CM | POA: Diagnosis not present

## 2023-09-27 DIAGNOSIS — E43 Unspecified severe protein-calorie malnutrition: Secondary | ICD-10-CM | POA: Diagnosis not present

## 2023-09-27 DIAGNOSIS — E875 Hyperkalemia: Secondary | ICD-10-CM | POA: Diagnosis not present

## 2023-09-27 DIAGNOSIS — E039 Hypothyroidism, unspecified: Secondary | ICD-10-CM | POA: Diagnosis not present

## 2023-09-27 DIAGNOSIS — D631 Anemia in chronic kidney disease: Secondary | ICD-10-CM | POA: Diagnosis not present

## 2023-09-27 DIAGNOSIS — I8510 Secondary esophageal varices without bleeding: Secondary | ICD-10-CM | POA: Diagnosis not present

## 2023-09-27 DIAGNOSIS — I4821 Permanent atrial fibrillation: Secondary | ICD-10-CM | POA: Diagnosis not present

## 2023-09-27 DIAGNOSIS — D500 Iron deficiency anemia secondary to blood loss (chronic): Secondary | ICD-10-CM | POA: Diagnosis not present

## 2023-09-27 DIAGNOSIS — N179 Acute kidney failure, unspecified: Secondary | ICD-10-CM | POA: Diagnosis not present

## 2023-09-27 DIAGNOSIS — E538 Deficiency of other specified B group vitamins: Secondary | ICD-10-CM | POA: Diagnosis not present

## 2023-09-27 DIAGNOSIS — C184 Malignant neoplasm of transverse colon: Secondary | ICD-10-CM | POA: Diagnosis not present

## 2023-09-27 DIAGNOSIS — E871 Hypo-osmolality and hyponatremia: Secondary | ICD-10-CM | POA: Diagnosis not present

## 2023-09-27 DIAGNOSIS — E1165 Type 2 diabetes mellitus with hyperglycemia: Secondary | ICD-10-CM | POA: Diagnosis not present

## 2023-09-27 DIAGNOSIS — I129 Hypertensive chronic kidney disease with stage 1 through stage 4 chronic kidney disease, or unspecified chronic kidney disease: Secondary | ICD-10-CM | POA: Diagnosis not present

## 2023-09-27 DIAGNOSIS — I4892 Unspecified atrial flutter: Secondary | ICD-10-CM | POA: Diagnosis not present

## 2023-09-27 DIAGNOSIS — Z452 Encounter for adjustment and management of vascular access device: Secondary | ICD-10-CM | POA: Diagnosis not present

## 2023-09-27 DIAGNOSIS — N390 Urinary tract infection, site not specified: Secondary | ICD-10-CM | POA: Diagnosis not present

## 2023-09-27 DIAGNOSIS — N1831 Chronic kidney disease, stage 3a: Secondary | ICD-10-CM | POA: Diagnosis not present

## 2023-09-27 DIAGNOSIS — B955 Unspecified streptococcus as the cause of diseases classified elsewhere: Secondary | ICD-10-CM | POA: Diagnosis not present

## 2023-09-27 LAB — CATH TIP CULTURE: Culture: 1000 — AB

## 2023-09-27 LAB — FLOW CYTOMETRY REQUEST - FLUID (INPATIENT)

## 2023-09-28 LAB — MISC LABCORP TEST (SEND OUT): Labcorp test code: 9985

## 2023-09-28 LAB — CULTURE, BLOOD (ROUTINE X 2)
Culture: NO GROWTH
Culture: NO GROWTH
Special Requests: ADEQUATE
Special Requests: ADEQUATE

## 2023-09-29 ENCOUNTER — Inpatient Hospital Stay: Payer: Self-pay | Admitting: Internal Medicine

## 2023-09-30 DIAGNOSIS — I4821 Permanent atrial fibrillation: Secondary | ICD-10-CM | POA: Diagnosis not present

## 2023-09-30 DIAGNOSIS — B955 Unspecified streptococcus as the cause of diseases classified elsewhere: Secondary | ICD-10-CM | POA: Diagnosis not present

## 2023-09-30 DIAGNOSIS — I129 Hypertensive chronic kidney disease with stage 1 through stage 4 chronic kidney disease, or unspecified chronic kidney disease: Secondary | ICD-10-CM | POA: Diagnosis not present

## 2023-09-30 DIAGNOSIS — Z452 Encounter for adjustment and management of vascular access device: Secondary | ICD-10-CM | POA: Diagnosis not present

## 2023-09-30 DIAGNOSIS — E43 Unspecified severe protein-calorie malnutrition: Secondary | ICD-10-CM | POA: Diagnosis not present

## 2023-09-30 DIAGNOSIS — K7460 Unspecified cirrhosis of liver: Secondary | ICD-10-CM | POA: Diagnosis not present

## 2023-09-30 LAB — MINIMUM INHIBITORY CONC. (1 DRUG): Source: 96388

## 2023-09-30 LAB — MIC RESULT

## 2023-10-02 LAB — CULTURE, BLOOD (ROUTINE X 2)

## 2023-10-03 ENCOUNTER — Ambulatory Visit (INDEPENDENT_AMBULATORY_CARE_PROVIDER_SITE_OTHER): Payer: Medicare Other | Admitting: Gastroenterology

## 2023-10-03 ENCOUNTER — Encounter (INDEPENDENT_AMBULATORY_CARE_PROVIDER_SITE_OTHER): Payer: Self-pay | Admitting: Gastroenterology

## 2023-10-03 ENCOUNTER — Inpatient Hospital Stay: Payer: Medicare Other | Attending: Hematology

## 2023-10-03 VITALS — BP 143/63 | HR 73 | Temp 97.6°F | Ht 72.0 in | Wt 197.0 lb

## 2023-10-03 DIAGNOSIS — E559 Vitamin D deficiency, unspecified: Secondary | ICD-10-CM | POA: Insufficient documentation

## 2023-10-03 DIAGNOSIS — D696 Thrombocytopenia, unspecified: Secondary | ICD-10-CM | POA: Insufficient documentation

## 2023-10-03 DIAGNOSIS — Z85038 Personal history of other malignant neoplasm of large intestine: Secondary | ICD-10-CM | POA: Diagnosis not present

## 2023-10-03 DIAGNOSIS — D509 Iron deficiency anemia, unspecified: Secondary | ICD-10-CM | POA: Insufficient documentation

## 2023-10-03 DIAGNOSIS — C184 Malignant neoplasm of transverse colon: Secondary | ICD-10-CM

## 2023-10-03 DIAGNOSIS — E538 Deficiency of other specified B group vitamins: Secondary | ICD-10-CM | POA: Insufficient documentation

## 2023-10-03 DIAGNOSIS — Z0189 Encounter for other specified special examinations: Secondary | ICD-10-CM | POA: Diagnosis not present

## 2023-10-03 DIAGNOSIS — Z792 Long term (current) use of antibiotics: Secondary | ICD-10-CM | POA: Diagnosis not present

## 2023-10-03 DIAGNOSIS — K7460 Unspecified cirrhosis of liver: Secondary | ICD-10-CM | POA: Diagnosis not present

## 2023-10-03 LAB — FOLATE: Folate: 16.2 ng/mL (ref >5.9)

## 2023-10-03 LAB — CBC WITH DIFFERENTIAL/PLATELET
Abs Immature Granulocytes: 0.03 10*3/uL (ref 0.00–0.07)
Basophils Absolute: 0.1 10*3/uL (ref 0.0–0.1)
Basophils Relative: 2 %
Eosinophils Absolute: 0.2 10*3/uL (ref 0.0–0.5)
Eosinophils Relative: 4 %
HCT: 30.4 % — ABNORMAL LOW (ref 39.0–52.0)
Hemoglobin: 10.2 g/dL — ABNORMAL LOW (ref 13.0–17.0)
Immature Granulocytes: 1 %
Lymphocytes Relative: 25 %
Lymphs Abs: 1.1 10*3/uL (ref 0.7–4.0)
MCH: 36.7 pg — ABNORMAL HIGH (ref 26.0–34.0)
MCHC: 33.6 g/dL (ref 30.0–36.0)
MCV: 109.4 fL — ABNORMAL HIGH (ref 80.0–100.0)
Monocytes Absolute: 0.5 10*3/uL (ref 0.1–1.0)
Monocytes Relative: 12 %
Neutro Abs: 2.4 10*3/uL (ref 1.7–7.7)
Neutrophils Relative %: 56 %
RBC: 2.78 MIL/uL — ABNORMAL LOW (ref 4.22–5.81)
RDW: 14.8 % (ref 11.5–15.5)
Smear Review: DECREASED
WBC: 4.2 10*3/uL (ref 4.0–10.5)
nRBC: 0 % (ref 0.0–0.2)

## 2023-10-03 LAB — IRON AND TIBC
Iron: 131 ug/dL (ref 45–182)
Saturation Ratios: 92 % — ABNORMAL HIGH (ref 17.9–39.5)
TIBC: 143 ug/dL — ABNORMAL LOW (ref 250–450)
UIBC: 12 ug/dL

## 2023-10-03 LAB — COMPREHENSIVE METABOLIC PANEL
ALT: 33 U/L (ref 0–44)
AST: 59 U/L — ABNORMAL HIGH (ref 15–41)
Albumin: 2.1 g/dL — ABNORMAL LOW (ref 3.5–5.0)
Alkaline Phosphatase: 108 U/L (ref 38–126)
Anion gap: 5 (ref 5–15)
BUN: 16 mg/dL (ref 8–23)
CO2: 23 mmol/L (ref 22–32)
Calcium: 8.3 mg/dL — ABNORMAL LOW (ref 8.9–10.3)
Chloride: 105 mmol/L (ref 98–111)
Creatinine, Ser: 1.11 mg/dL (ref 0.61–1.24)
GFR, Estimated: >60 mL/min (ref >60)
Glucose, Bld: 159 mg/dL — ABNORMAL HIGH (ref 70–99)
Potassium: 4.4 mmol/L (ref 3.5–5.1)
Sodium: 133 mmol/L — ABNORMAL LOW (ref 135–145)
Total Bilirubin: 4.1 mg/dL — ABNORMAL HIGH (ref 0.0–1.2)
Total Protein: 6.5 g/dL (ref 6.5–8.1)

## 2023-10-03 LAB — FERRITIN: Ferritin: 193 ng/mL (ref 24–336)

## 2023-10-03 LAB — VITAMIN B12: Vitamin B-12: 643 pg/mL (ref 180–914)

## 2023-10-03 NOTE — Progress Notes (Unsigned)
 Referring Provider: Ignatius Specking, MD Primary Care Physician:  Ignatius Specking, MD Primary GI Physician: Dr. Levon Hedger   Chief Complaint  Patient presents with   Follow-up    Pt arrives for follow up. Pt was in hospital from 09/19/23-09/26/23 due to vomiting, blood steam infection, fever or 102, and shortness of breath. Pt has thoracentesis on 09/21/23 yielding 2.1 L   HPI:   Jose Lawrence is a 83 y.o. male with past medical history of  BPH, colon cancer status post low anterior resection and APR with removal of rest of colon, chemotherapy, as well as permanent ileostomy, hypothyroidism, history of lacunar brain infarction, type 2 diabetes   Patient presenting today for follow up of cirrhosis  Last seen August 2024, at that time patient reports he was unaware he supposed to do TPMT testing.  He denies any swelling to his abdomen.  Taking furosemide 40 mg usually 3 days in a row then will wait a few days and then start taking it again.  Denies episodes of confusion.  Patient recommended to update INR, AFP, liver ultrasound, schedule EGD for EV screening, no treatment for a warranted given liver biopsy results.  EGD performed in October, as outlined above, started on carvedilol 3.125 mg twice daily  Present: Patient with recent hospital admission for staph lugdenesis bacteremia thought secondary to chest port. TEE with no vegetation. Required thoracentesis which he states helped his breathing. Currently being followed by ID with repeat labs on 2/27. He is still on antibiotics via PICC line and notes some peeling to his hands that started shortly after initiation of this. Has not discussed this with ID doctor yet as he has an appt with them on Thursday and finished abx on Friday.   Notably his coreg was held during admission due to bradycardia   Labs this morning with hgb 10.5, plt (unreadable due to clumping), AST 59, t bili 4.1  He is not currently taking his coreg. Notes HR has been in  the 50s at home though he has not been checking his BP at home.  He denies any episodes of confusion. He empties his ostomy bag 3-4 times per day which is baseline for him. Denies blood in stool or black stools. Denies swelling to abdomen, jaundice or pruritus.     Previous MELD 3.0: with labs from this month 26   Cirrhosis related questions: Episodes of confusion/disorientation: no  Taking diuretics? Lasix 20-40mg  daily  Beta blockers? Supposed to be on coreg 3.125mg  BID, as above  Prior history of variceal banding? No  Prior episodes of SBP? No  Last liver imaging: Right upper quadrant ultrasound on 04/19/2023 with cirrhotic morphology of the liver, no focal hepatic lesions, moderate volume ascites Last AFP; 03/2023 3 Alcohol use: none    EGD: 05/10/2023 grade 1 esophageal varices, portal hypertensive gastropathy, normal examined duodenum, no specimens collected Last ileoscopy performed on 10/04/2021, found to have congested mucosa in the distal ileum and ileus..  Biopsies only showed mucosal hyperemia. Last capsule endoscopy was performed 10/04/2021, to have multiple petechia at the start of capsule.  Presence of small amount of blood due to small bowel biopsy, also presence of marked edema and erythema at the stoma.  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 Elastography with increased nodular contour of the liver with a K PA of 7.2 with a ratio of 0.49    Liver biopsy in April 2024 with cirrhotic changes, inflammatory  changes without overt AIH though given IgG and ASMA, there were plans to start on prednisone and AZA (to determine response of LFTs) after TMPT lab though TMPT lab never completed    Past Medical History:  Diagnosis Date   Anemia    Causing interruption in anticoagulation   Atrial fibrillation and flutter (HCC)    BPH (benign prostatic hypertrophy)    Colon cancer (HCC)    Status post LAR 2008   Dysrhythmia     Hypothyroidism    Lacunar infarction (HCC) 11/2012   RT 3rd NERVE PALSY, SB Dr. Lita Mains   PE (pulmonary embolism)    Temporarily on Eliquis 2017   Skin cancer    35 years ago   Type 2 diabetes mellitus (HCC)    Vitamin B 12 deficiency 07/10/2019    Past Surgical History:  Procedure Laterality Date   ABDOMINOPERINEAL PROCTOCOLECTOMY  2009   end colostomy    BIOPSY  08/25/2018   Procedure: BIOPSY;  Surgeon: Malissa Hippo, MD;  [facility];  Service: Endoscopy;;   BIOPSY  10/04/2021   Procedure: BIOPSY;  Surgeon: Malissa Hippo, MD;  [facility];  Service: Endoscopy;;  small, bowel   BLADDER REPAIR  08/29/2018   Procedure: BLADDER REPAIR;  Surgeon: Lucretia Roers, MD;  Location: [facility];  Service: General;;   COLECTOMY  2008   COLONOSCOPY WITH PROPOFOL N/A 08/25/2018   Procedure: COLONOSCOPY WITH PROPOFOL;  Surgeon: Malissa Hippo, MD;  [facility];  Service: Endoscopy;  Laterality: N/A;  2:20   COLOSTOMY     ESOPHAGOGASTRODUODENOSCOPY (EGD) WITH PROPOFOL N/A 05/10/2023   Procedure: ESOPHAGOGASTRODUODENOSCOPY (EGD) WITH PROPOFOL;  Surgeon: Dolores Frame, MD;  [facility];  Service: Gastroenterology;  Laterality: N/A;  11:00AM;ASA 3   EXPLORATORY LAPAROTOMY  2017   ? pneumoperitoneum of unknown etiology/ negative Ex lap   GIVENS CAPSULE STUDY  10/04/2021   Procedure: GIVENS CAPSULE STUDY;  Surgeon: Malissa Hippo, MD;  [facility];  Service: Endoscopy;;   ILEOSCOPY N/A 10/04/2021   Procedure: ILEOSCOPY THROUGH STOMA;  Surgeon: Malissa Hippo, MD;  [facility];  Service: Endoscopy;  Laterality: N/A;   ILEOSTOMY  08/29/2018   Procedure: ILEOSTOMY;  Surgeon: Lucretia Roers, MD;  Location: [facility];  Service: General;;   LAPAROSCOPIC CHOLECYSTECTOMY  2007   LYSIS OF ADHESION  08/29/2018   Procedure: LYSIS OF ADHESION;  Surgeon: Lucretia Roers, MD;  Location: [facility];  Service: General;;    PARTIAL COLECTOMY N/A 08/29/2018   Procedure: PARTIAL COLECTOMY;  Surgeon: Lucretia Roers, MD;  Location: [facility];  Service: General;  Laterality: N/A;   PORT-A-CATH REMOVAL Left 09/23/2023   Procedure: MINOR REMOVAL PORT-A-CATH;  Surgeon: Marjo Bicker, MD;  Location: [facility];  Service: Cardiovascular;  Laterality: Left;   PORTACATH PLACEMENT Left 10/04/2018   Procedure: INSERTION PORT-A-CATH (attached catheter left subclavian);  Surgeon: Lucretia Roers, MD;  Location: [facility];  Service: General;  Laterality: Left;   SBO SURGERY  06/2009   TEE WITHOUT CARDIOVERSION N/A 09/23/2023   Procedure: TRANSESOPHAGEAL ECHOCARDIOGRAM (TEE);  Surgeon: Marjo Bicker, MD;  Location: [facility];  Service: Cardiovascular;  Laterality: N/A;    Current Outpatient Medications  Medication Sig Dispense Refill   daptomycin (CUBICIN) IVPB Inject 700 mg into the vein daily for 11 days. Indication:  staph lugdunensis bacteremia First Dose: Yes Last Day of Therapy:  10/07/23 Labs - Once weekly:  CBC/D, BMP, and CPK Labs - Once weekly: ESR and CRP Method of administration: IV Push Method of administration may be changed at the discretion of home infusion pharmacist based upon assessment of the patient and/or caregiver's ability to self-administer the medication ordered. 11 Units 0   furosemide (LASIX) 40 MG tablet Take 20-40 mg by mouth daily as needed.     lactose free nutrition (BOOST) LIQD Take 237 mLs by mouth daily at 6 (six) AM.     levothyroxine (SYNTHROID, LEVOTHROID) 50 MCG tablet Take 50 mcg by mouth daily before breakfast.     loperamide (IMODIUM) 2 MG capsule Take 2 capsules (4 mg total) by mouth 4 (four) times daily -  before meals and at bedtime. 240 capsule 3   potassium chloride (KLOR-CON) 10 MEQ tablet Take 1 tablet (10 mEq total) by mouth daily as needed. Take when you take Lasix/Furosemide 30 tablet 0   tamsulosin (FLOMAX) 0.4 MG CAPS capsule Take 1 capsule (0.4 mg total) by mouth daily  after supper. 30 capsule 3   Vitamin D, Ergocalciferol, (DRISDOL) 1.25 MG (50000 UNIT) CAPS capsule Take 1 capsule by mouth once a week 16 capsule 0   No current facility-administered medications for this visit.    Allergies as of 10/03/2023   (No Known Allergies)    Family History  Problem Relation Age of Onset   Heart disease Father    Heart failure Father    Heart attack Father    Prostate cancer Paternal Uncle     Social History   Socioeconomic History   Marital status: Married    Spouse name: Not on file   Number of children: Not on file   Years of education: Not on file   Highest education level: Not on file  Occupational History   Occupation: Holiday representative    Comment: [employer]  Tobacco Use   Smoking status: Former    Current packs/day: 0.00    Average packs/day: 1 pack/day for 27.0 years (27.0 ttl pk-yrs)    Types: Cigarettes    Start date: 01/27/1964    Quit date: 01/27/1991    Years since quitting: 32.7   Smokeless tobacco: Former    Types: Chew    Quit date: 08/10/1995  Vaping Use   Vaping status: Never Used  Substance and Sexual Activity   Alcohol use: Never    Alcohol/week: 0.0 standard drinks of alcohol   Drug use: Never   Sexual activity: Not Currently  Other Topics Concern   Not on file  Social History Narrative   Not on file   Social Drivers of Health   Financial Resource Strain: Low Risk  (09/01/2020)   Overall Financial Resource Strain (CARDIA)    Difficulty of Paying Living Expenses: Not hard at all  Food Insecurity: No Food Insecurity (09/20/2023)   Hunger Vital Sign    Worried About Running Out of Food in the Last Year: Never true    Ran Out of Food in the Last Year: Never true  Transportation Needs: No Transportation Needs (09/20/2023)   PRAPARE - Administrator, Civil Service (Medical): No    Lack of Transportation (Non-Medical): No  Physical Activity: Sufficiently Active (09/01/2020)   Exercise Vital Sign    Days of Exercise  per Week: 7 days    Minutes of Exercise per Session: 30 min  Stress: No Stress Concern Present (09/01/2020)   [employer] of Occupational Health - Occupational Stress Questionnaire    Feeling of Stress :  Not at all  Social Connections: Socially Integrated (09/20/2023)   Social Connection and Isolation Panel [NHANES]    Frequency of Communication with Friends and Family: More than three times a week    Frequency of Social Gatherings with Friends and Family: More than three times a week    Attends Religious Services: More than 4 times per year    Active Member of [employer] or Organizations: Yes    Attends Engineer, structural: More than 4 times per year    Marital Status: Married    Review of systems General: negative for malaise, night sweats, fever, chills, weight loss Neck: Negative for lumps, goiter, pain and significant neck swelling Resp: Negative for cough, wheezing, dyspnea at rest CV: Negative for chest pain, leg swelling, palpitations, orthopnea GI: denies melena, hematochezia, nausea, vomiting, diarrhea, constipation, dysphagia, odyonophagia, early satiety or unintentional weight loss.  MSK: Negative for joint pain or swelling, back pain, and muscle pain. Derm: Negative for itching or rash The remainder of the review of systems is noncontributory.  Physical Exam: BP (!) 147/66   Pulse 73   Temp 97.6 F (36.4 C)   Ht 6' (1.829 m)   Wt 197 lb (89.4 kg)   BMI 26.72 kg/m  General:   Alert and oriented. No distress noted. Pleasant and cooperative.  Head:  Normocephalic and atraumatic. Eyes:  Conjuctiva clear without scleral icterus. Mouth:  Oral mucosa pink and moist. Good dentition. No lesions. Heart: Normal rate and rhythm, s1 and s2 heart sounds present.  Lungs: Clear lung sounds in all lobes. Respirations equal and unlabored. Abdomen:  +BS, soft, non-tender and non-distended. No rebound or guarding. No HSM or masses noted. Derm: No palmar erythema or  jaundice Msk:  Symmetrical without gross deformities. Normal posture. Extremities:  Without edema. Neurologic:  Alert and  oriented x4 Psych:  Alert and cooperative. Normal mood and affect.  Invalid input(s): "6 MONTHS"   ASSESSMENT: Jose Lawrence is a 83 y.o. male presenting today for follow up of cirrhosis  Patient with history of biopsy confirmed liver cirrhosis, remains well compensated. He denies any ascites or episodes of confusion. Unfortunately his last MELD was elevated at 26, though labs were from admission with acute illness from bacteremia which likely influenced his score. Recent EGD with grade 1 EVs, patient was started on coreg 3.125mg  BID for bleeding prophylaxis but due to recent hospitalization for bacteremia, Beta blocker was stopped during admission due to bradycardia. HR today is 73. He reports checking HR at home with some readings in the 50s. Ideally would prefer to see more than one reading prior to restarting Beta blocker. I requested patient check his HR and BP at the same time each day x1 week and let me know what the readings are to determine if BB needs to be restarted. He will be due for [facility] screening in March. Will plan to repeat MELD labs and update AFP in April, hopefully MELD will be improved after acute illness has resolved, though unlikely patient would be a transplant candidate give his advanced age and comorbidities.    PLAN:  -HCC screening via [facility] [place] due March - repeat MELD labs and AFP in APril - discuss peeling hands with ID -keep track of HR and BP x7 days, consider restarting coreg thereafter pendign these results  - Reduce salt intake to <2 g per day - Can take Tylenol max of 2 g per day (650 mg q8h) for pain - Avoid NSAIDs for pain -  Avoid eating raw oysters/shellfish - Ensure every night before going to sleep  All questions were answered, patient verbalized understanding and is in agreement with plan as outlined above.    Follow Up: 6  months   Jose Mcquain L. Jeanmarie Hubert, MSN, APRN, AGNP-C Adult-Gerontology Nurse Practitioner [facility]  I have reviewed the note and agree with the APP's assessment as described in this progress note  Katrinka Blazing, MD Gastroenterology and Hepatology [facility]

## 2023-10-03 NOTE — Patient Instructions (Addendum)
-  You will be due for US of the liver in march -We will recheck some of your liver labs in 1-2 months to ensure these are improving after your recent illness -Please keep track of your heart rate and blood pressure once daily for the next 7 days and let me know these readings to determine if we need to restart the Coreg you are on for your esophageal varices. - Reduce salt intake to <2 g per day - Can take Tylenol max of 2 g per day (650 mg q8h) for pain - Avoid NSAIDs for pain - Avoid eating raw oysters/shellfish - Ensure every night before going to sleep  Follow up 6 months   It was a pleasure to see you today. I want to create trusting relationships with patients and provide genuine, compassionate, and quality care. I truly value your feedback! please be on the lookout for a survey regarding your visit with me today. I appreciate your input about our visit and your time in completing this!    Jose Mees L. Jeanmarie Hubert, MSN, APRN, AGNP-C Adult-Gerontology Nurse Practitioner [facility]

## 2023-10-04 ENCOUNTER — Encounter (INDEPENDENT_AMBULATORY_CARE_PROVIDER_SITE_OTHER): Payer: Self-pay

## 2023-10-04 DIAGNOSIS — Z299 Encounter for prophylactic measures, unspecified: Secondary | ICD-10-CM | POA: Diagnosis not present

## 2023-10-04 DIAGNOSIS — I10 Essential (primary) hypertension: Secondary | ICD-10-CM | POA: Diagnosis not present

## 2023-10-04 DIAGNOSIS — R5383 Other fatigue: Secondary | ICD-10-CM | POA: Diagnosis not present

## 2023-10-04 DIAGNOSIS — I700 Atherosclerosis of aorta: Secondary | ICD-10-CM | POA: Diagnosis not present

## 2023-10-04 DIAGNOSIS — Z79899 Other long term (current) drug therapy: Secondary | ICD-10-CM | POA: Diagnosis not present

## 2023-10-04 DIAGNOSIS — E1169 Type 2 diabetes mellitus with other specified complication: Secondary | ICD-10-CM | POA: Diagnosis not present

## 2023-10-04 DIAGNOSIS — I4891 Unspecified atrial fibrillation: Secondary | ICD-10-CM | POA: Diagnosis not present

## 2023-10-04 LAB — CEA: CEA: 3.7 ng/mL (ref 0.0–4.7)

## 2023-10-06 ENCOUNTER — Inpatient Hospital Stay: Payer: Medicare Other | Admitting: Infectious Diseases

## 2023-10-06 ENCOUNTER — Telehealth: Payer: Self-pay

## 2023-10-06 NOTE — Telephone Encounter (Signed)
 Per Dr. Thedore Mins, okay to pull PICC after last dose on 10/07/23. Orders sent to Jeri Modena, RN with [facility] to share with [facility] in [place].   Sandie Ano, RN

## 2023-10-08 DIAGNOSIS — I129 Hypertensive chronic kidney disease with stage 1 through stage 4 chronic kidney disease, or unspecified chronic kidney disease: Secondary | ICD-10-CM | POA: Diagnosis not present

## 2023-10-08 DIAGNOSIS — I4821 Permanent atrial fibrillation: Secondary | ICD-10-CM | POA: Diagnosis not present

## 2023-10-08 DIAGNOSIS — K7460 Unspecified cirrhosis of liver: Secondary | ICD-10-CM | POA: Diagnosis not present

## 2023-10-08 DIAGNOSIS — B955 Unspecified streptococcus as the cause of diseases classified elsewhere: Secondary | ICD-10-CM | POA: Diagnosis not present

## 2023-10-08 DIAGNOSIS — E43 Unspecified severe protein-calorie malnutrition: Secondary | ICD-10-CM | POA: Diagnosis not present

## 2023-10-08 DIAGNOSIS — Z452 Encounter for adjustment and management of vascular access device: Secondary | ICD-10-CM | POA: Diagnosis not present

## 2023-10-09 NOTE — Progress Notes (Signed)
 [facility] [address], [place] [address]    Clinic Day:  10/10/2023  Referring physician: Ignatius Specking, MD  Patient Care Team: Ignatius Specking, MD as PCP - General (Internal Medicine) Wyline Mood Dorothe Pea, MD as PCP - Cardiology (Cardiology)   ASSESSMENT & PLAN:   Assessment: 1.  Stage IIb (T4N0) transverse colon adenocarcinoma, MSI stable: -History of colon cancer in 2008, status post resection, did not receive any adjuvant chemo. -Recurrence in February 2009, underwent resection with colostomy, followed by 6 months of FOLFOX with radiation. -12 cycles of adjuvant chemotherapy with infusional 5-FU/leucovorin from 10/09/2018-03/21/2019. -CTAP on 10/08/2019 did not show any evidence of recurrence. -CTAP on 04/21/2020 shows unchanged postoperative appearance of the low pelvis and presacral soft tissues with no evidence of recurrence or metastatic disease.    Plan: 1.  Stage IIb (T4N0) transverse colon adenocarcinoma, MSI stable: - Denies any change in bowel habits.  Stool consistency in the colostomy bag is unchanged.  No blood reported. - He was admitted to the hospital from 09/19/2023 through 09/26/2023 with staph lugdunensis infection.  Port was discontinued.  He received daptomycin via PICC line until 10/07/2023.  PICC was discontinued on 10/08/2023. - Labs from 10/03/2023 shows normal CEA levels.  LFTs shows mildly elevated AST which is stable.  Bilirubin was elevated at 4.1. - He reported staggering while walking.  I have recommended that he use a cane.  I have checked LFTs today which showed total bilirubin increased to 6.6 and direct bilirubin is 2.6.  AST and ALT are elevated above his baseline.  Ammonia level is 22. - Daptomycin can cause elevated LFTs.  Will discuss with GI service. - For his colon cancer, recommend follow-up in 6 months with repeat labs.   2.  B12 Deficiency: - Is not taking B12 supplements.  B12 level is normal.   3.  Vitamin D deficiency: -  Continue weekly vitamin D and vitamin D 2000 units daily.  Last vitamin D was normal.   4.  Moderate thrombocytopenia: - This is from splenic sequestration from splenomegaly and liver disease.  Overall this is stable.    Orders Placed This Encounter  Procedures   Ammonia    Standing Status:   Future    Number of Occurrences:   1    Expected Date:   10/10/2023    Expiration Date:   10/09/2024   Hepatic function panel    Standing Status:   Future    Number of Occurrences:   1    Expected Date:   10/10/2023    Expiration Date:   10/09/2024   CBC with Differential    Standing Status:   Future    Expected Date:   04/09/2024    Expiration Date:   10/09/2024   Comprehensive metabolic panel    Standing Status:   Future    Expected Date:   04/09/2024    Expiration Date:   10/09/2024   CEA    Standing Status:   Future    Expected Date:   04/09/2024    Expiration Date:   10/09/2024   Iron and TIBC ([facility] ONLY)    Standing Status:   Future    Expected Date:   04/09/2024    Expiration Date:   10/09/2024   Ferritin    Standing Status:   Future    Expected Date:   04/09/2024    Expiration Date:   10/09/2024  I,Katie Daubenspeck,acting as a Neurosurgeon for Doreatha Massed, MD.,have documented all relevant documentation on the behalf of Doreatha Massed, MD,as directed by  Doreatha Massed, MD while in the presence of Doreatha Massed, MD.   I, Doreatha Massed MD, have reviewed the above documentation for accuracy and completeness, and I agree with the above.   Doreatha Massed, MD   3/3/20252:57 PM  CHIEF COMPLAINT:   Diagnosis: transverse colon cancer    Cancer Staging  Cancer of transverse colon [facility]) Staging form: Colon and Rectum, AJCC 8th Edition - Clinical stage from 09/29/2018: Stage IIB (cT4a, cN0, cM0) - Signed by Doreatha Massed, MD on 09/29/2018    Prior Therapy: 1. Colon resection with colostomy in 09/2007. 2. FOLFOX and radiation from 09/2007  through 02/2008 3. 12 cycles of infusional 5-FU and leucovorin from 10/09/2018 through 03/21/2019.  Current Therapy:  Surveillance    HISTORY OF PRESENT ILLNESS:   Oncology History  Cancer of transverse colon (HCC)  09/23/2018 Initial Diagnosis   Cancer of transverse colon (HCC)   09/29/2018 Cancer Staging   Staging form: Colon and Rectum, AJCC 8th Edition - Clinical stage from 09/29/2018: Stage IIB (cT4a, cN0, cM0) - Signed by Doreatha Massed, MD on 09/29/2018   10/09/2018 - 03/23/2019 Chemotherapy   The patient had ondansetron (ZOFRAN) 8 mg in sodium chloride 0.9 % 50 mL IVPB, 8 mg (100 % of original dose 8 mg), Intravenous,  Once, 12 of 12 cycles Dose modification: 8 mg (original dose 8 mg, Cycle 1) Administration: 8 mg (10/09/2018), 8 mg (10/23/2018), 8 mg (11/06/2018), 8 mg (11/20/2018), 8 mg (12/04/2018), 8 mg (12/18/2018), 8 mg (01/02/2019), 8 mg (01/15/2019), 8 mg (02/06/2019), 8 mg (02/21/2019), 8 mg (03/07/2019), 8 mg (03/21/2019) leucovorin 800 mg in dextrose 5 % 250 mL infusion, 820 mg, Intravenous,  Once, 12 of 12 cycles Administration: 800 mg (10/09/2018), 820 mg (10/23/2018), 820 mg (11/06/2018), 820 mg (11/20/2018), 820 mg (12/04/2018), 820 mg (12/18/2018), 820 mg (01/02/2019), 820 mg (01/15/2019), 820 mg (02/06/2019), 820 mg (02/21/2019), 820 mg (03/07/2019), 820 mg (03/21/2019) fluorouracil (ADRUCIL) chemo injection 800 mg, 400 mg/m2 = 800 mg, Intravenous,  Once, 12 of 12 cycles Administration: 800 mg (10/09/2018), 800 mg (10/23/2018), 800 mg (11/06/2018), 800 mg (11/20/2018), 800 mg (12/04/2018), 800 mg (12/18/2018), 800 mg (01/02/2019), 800 mg (01/15/2019), 800 mg (02/06/2019), 800 mg (02/21/2019), 800 mg (03/07/2019), 800 mg (03/21/2019) fluorouracil (ADRUCIL) 4,900 mg in sodium chloride 0.9 % 52 mL chemo infusion, 2,400 mg/m2 = 4,900 mg, Intravenous, 1 Day/Dose, 12 of 12 cycles Administration: 4,900 mg (10/09/2018), 4,900 mg (10/23/2018), 5,000 mg (11/06/2018), 5,000 mg (11/20/2018), 5,000 mg (12/04/2018), 5,000 mg  (12/18/2018), 5,000 mg (01/02/2019), 5,000 mg (01/15/2019), 5,000 mg (02/06/2019), 5,000 mg (02/21/2019), 5,000 mg (03/07/2019), 5,000 mg (03/21/2019)  for chemotherapy treatment.       INTERVAL HISTORY:   Jabre is a 83 y.o. male presenting to clinic today for follow up of transverse colon cancer. He was last seen by me on 04/12/23.  Since his last visit, he has been hospitalized multiple times for sepsis, 04/22/23, 06/08/23, and 09/19/23.  During his most recent hospitalization, he underwent CT C/A/P showing: interval increase in size of a moderate right and small left pleural effusion; almost complete collapse of RLL; cirrhosis with portal hypertension, no focal liver lesions; grossly stab le small volume simple free fluid ascites; redemonstration of extensive presacral mesenteric fat stranding and soft tissue density (markedly limited evaluation of this region).  Of note, he underwent thoracentesis on 09/21/23. Cytology showed no malignant  cells.  Today, he states that he is doing well overall. His appetite level is at 100%. His energy level is at 50%.  PAST MEDICAL HISTORY:   Past Medical History: Past Medical History:  Diagnosis Date   Anemia    Causing interruption in anticoagulation   Atrial fibrillation and flutter (HCC)    BPH (benign prostatic hypertrophy)    Colon cancer (HCC)    Status post LAR 2008   Dysrhythmia    Hypothyroidism    Lacunar infarction (HCC) 11/2012   RT 3rd NERVE PALSY, SB Dr. Lita Mains   PE (pulmonary embolism)    Temporarily on Eliquis 2017   Skin cancer    35 years ago   Type 2 diabetes mellitus (HCC)    Vitamin B 12 deficiency 07/10/2019    Surgical History: Past Surgical History:  Procedure Laterality Date   ABDOMINOPERINEAL PROCTOCOLECTOMY  2009   end colostomy    BIOPSY  08/25/2018   Procedure: BIOPSY;  Surgeon: Malissa Hippo, MD;  [facility];  Service: Endoscopy;;   BIOPSY  10/04/2021   Procedure: BIOPSY;  Surgeon: Malissa Hippo,  MD;  [facility];  Service: Endoscopy;;  small, bowel   BLADDER REPAIR  08/29/2018   Procedure: BLADDER REPAIR;  Surgeon: Lucretia Roers, MD;  Location: [facility];  Service: General;;   COLECTOMY  2008   COLONOSCOPY WITH PROPOFOL N/A 08/25/2018   Procedure: COLONOSCOPY WITH PROPOFOL;  Surgeon: Malissa Hippo, MD;  [facility];  Service: Endoscopy;  Laterality: N/A;  2:20   COLOSTOMY     ESOPHAGOGASTRODUODENOSCOPY (EGD) WITH PROPOFOL N/A 05/10/2023   Procedure: ESOPHAGOGASTRODUODENOSCOPY (EGD) WITH PROPOFOL;  Surgeon: Dolores Frame, MD;  [facility];  Service: Gastroenterology;  Laterality: N/A;  11:00AM;ASA 3   EXPLORATORY LAPAROTOMY  2017   ? pneumoperitoneum of unknown etiology/ negative Ex lap   GIVENS CAPSULE STUDY  10/04/2021   Procedure: GIVENS CAPSULE STUDY;  Surgeon: Malissa Hippo, MD;  [facility];  Service: Endoscopy;;   ILEOSCOPY N/A 10/04/2021   Procedure: ILEOSCOPY THROUGH STOMA;  Surgeon: Malissa Hippo, MD;  [facility];  Service: Endoscopy;  Laterality: N/A;   ILEOSTOMY  08/29/2018   Procedure: ILEOSTOMY;  Surgeon: Lucretia Roers, MD;  [facility]: [facility];  Service: General;;   LAPAROSCOPIC CHOLECYSTECTOMY  2007   LYSIS OF ADHESION  08/29/2018   Procedure: LYSIS OF ADHESION;  Surgeon: Lucretia Roers, MD;  [facility]: [facility];  Service: General;;   PARTIAL COLECTOMY N/A 08/29/2018   Procedure: PARTIAL COLECTOMY;  Surgeon: Lucretia Roers, MD;  [facility];  Service: General;  Laterality: N/A;   PORT-A-CATH REMOVAL Left 09/23/2023   Procedure: MINOR REMOVAL PORT-A-CATH;  Surgeon: Marjo Bicker, MD;  Location: [facility];  Service: Cardiovascular;  Laterality: Left;   PORTACATH PLACEMENT Left 10/04/2018   Procedure: INSERTION PORT-A-CATH (attached catheter left subclavian);  Surgeon: Lucretia Roers, MD;  Location[facility];  Service: General;  Laterality: Left;   SBO SURGERY  06/2009   TEE  WITHOUT CARDIOVERSION N/A 09/23/2023   Procedure: TRANSESOPHAGEAL ECHOCARDIOGRAM (TEE);  Surgeon: Marjo Bicker, MD;  Location: [facility];  Service: Cardiovascular;  Laterality: N/A;    Social History: Social History   Socioeconomic History   Marital status: Married    Spouse name: Not on file   Number of children: Not on file   Years of education: Not on file   Highest education  level: Not on file  Occupational History   Occupation: Holiday representative    Comment: [employer]  Tobacco Use   Smoking status: Former    Current packs/day: 0.00    Average packs/day: 1 pack/day for 27.0 years (27.0 ttl pk-yrs)    Types: Cigarettes    Start date: 01/27/1964    Quit date: 01/27/1991    Years since quitting: 32.7   Smokeless tobacco: Former    Types: Chew    Quit date: 08/10/1995  Vaping Use   Vaping status: Never Used  Substance and Sexual Activity   Alcohol use: Never    Alcohol/week: 0.0 standard drinks of alcohol   Drug use: Never   Sexual activity: Not Currently  Other Topics Concern   Not on file  Social History Narrative   Not on file   Social Drivers of Health   Financial Resource Strain: Low Risk  (09/01/2020)   Overall Financial Resource Strain (CARDIA)    Difficulty of Paying Living Expenses: Not hard at all  Food Insecurity: No Food Insecurity (09/20/2023)   Hunger Vital Sign    Worried About Running Out of Food in the Last Year: Never true    Ran Out of Food in the Last Year: Never true  Transportation Needs: No Transportation Needs (09/20/2023)   PRAPARE - Administrator, Civil Service (Medical): No    Lack of Transportation (Non-Medical): No  Physical Activity: Sufficiently Active (09/01/2020)   Exercise Vital Sign    Days of Exercise per Week: 7 days    Minutes of Exercise per Session: 30 min  Stress: No Stress Concern Present (09/01/2020)   [employer] of Occupational Health - Occupational Stress Questionnaire    Feeling of Stress : Not at all   Social Connections: Socially Integrated (09/20/2023)   Social Connection and Isolation Panel [NHANES]    Frequency of Communication with Friends and Family: More than three times a week    Frequency of Social Gatherings with Friends and Family: More than three times a week    Attends Religious Services: More than 4 times per year    Active Member of [employer] or Organizations: Yes    Attends Engineer, structural: More than 4 times per year    Marital Status: Married  Catering manager Violence: Not At Risk (09/20/2023)   Humiliation, Afraid, Rape, and Kick questionnaire    Fear of Current or Ex-Partner: No    Emotionally Abused: No    Physically Abused: No    Sexually Abused: No    Family History: Family History  Problem Relation Age of Onset   Heart disease Father    Heart failure Father    Heart attack Father    Prostate cancer Paternal Uncle     Current Medications:  Current Outpatient Medications:    DAPTOmycin (CUBICIN) 500 MG injection, Inject into the vein., Disp: , Rfl:    furosemide (LASIX) 40 MG tablet, Take 20-40 mg by mouth daily as needed., Disp: , Rfl:    lactose free nutrition (BOOST) LIQD, Take 237 mLs by mouth daily at 6 (six) AM., Disp: , Rfl:    levothyroxine (SYNTHROID, LEVOTHROID) 50 MCG tablet, Take 50 mcg by mouth daily before breakfast., Disp: , Rfl:    loperamide (IMODIUM) 2 MG capsule, Take 2 capsules (4 mg total) by mouth 4 (four) times daily -  before meals and at bedtime., Disp: 240 capsule, Rfl: 3   potassium chloride (KLOR-CON) 10 MEQ tablet, Take 1 tablet (10  mEq total) by mouth daily as needed. Take when you take Lasix/Furosemide, Disp: 30 tablet, Rfl: 0   tamsulosin (FLOMAX) 0.4 MG CAPS capsule, Take 1 capsule (0.4 mg total) by mouth daily after supper., Disp: 30 capsule, Rfl: 3   Vitamin D, Ergocalciferol, (DRISDOL) 1.25 MG (50000 UNIT) CAPS capsule, Take 1 capsule by mouth once a week, Disp: 16 capsule, Rfl: 0   Allergies: No Known  Allergies  REVIEW OF SYSTEMS:   Review of Systems  Constitutional:  Negative for chills, fatigue and fever.  HENT:   Negative for lump/mass, mouth sores, nosebleeds, sore throat and trouble swallowing.   Eyes:  Negative for eye problems.  Respiratory:  Positive for cough and shortness of breath.   Cardiovascular:  Negative for chest pain, leg swelling and palpitations.  Gastrointestinal:  Negative for abdominal pain, constipation, diarrhea, nausea and vomiting.  Genitourinary:  Negative for bladder incontinence, difficulty urinating, dysuria, frequency, hematuria and nocturia.   Musculoskeletal:  Negative for arthralgias, back pain, flank pain, myalgias and neck pain.  Skin:  Negative for itching and rash.  Neurological:  Positive for dizziness, headaches and numbness.  Hematological:  Does not bruise/bleed easily.  Psychiatric/Behavioral:  Negative for depression, sleep disturbance and suicidal ideas. The patient is not nervous/anxious.   All other systems reviewed and are negative.    VITALS:   Blood pressure (!) 155/68, pulse (!) 55, temperature (!) 97.5 F (36.4 C), temperature source Tympanic, resp. rate 18, height 6' (1.829 m), weight 193 lb (87.5 kg), SpO2 100%.  Wt Readings from Last 3 Encounters:  10/10/23 193 lb (87.5 kg)  10/03/23 197 lb (89.4 kg)  09/23/23 196 lb 3.4 oz (89 kg)    Body mass index is 26.18 kg/m.  Performance status (ECOG): 1 - Symptomatic but completely ambulatory  PHYSICAL EXAM:   Physical Exam Vitals and nursing note reviewed. Exam conducted with a chaperone present.  Constitutional:      Appearance: Normal appearance.  Cardiovascular:     Rate and Rhythm: Normal rate and regular rhythm.     Pulses: Normal pulses.     Heart sounds: Normal heart sounds.  Pulmonary:     Effort: Pulmonary effort is normal.     Breath sounds: Normal breath sounds.  Abdominal:     Palpations: Abdomen is soft. There is no hepatomegaly, splenomegaly or mass.      Tenderness: There is no abdominal tenderness.  Musculoskeletal:     Right lower leg: No edema.     Left lower leg: No edema.  Lymphadenopathy:     Cervical: No cervical adenopathy.     Right cervical: No superficial, deep or posterior cervical adenopathy.    Left cervical: No superficial, deep or posterior cervical adenopathy.     Upper Body:     Right upper body: No supraclavicular or axillary adenopathy.     Left upper body: No supraclavicular or axillary adenopathy.  Neurological:     General: No focal deficit present.     Mental Status: He is alert and oriented to person, place, and time.  Psychiatric:        Mood and Affect: Mood normal.        Behavior: Behavior normal.     LABS:   CBC     Component Value Date/Time   WBC 4.2 10/03/2023 1056   RBC 2.78 (L) 10/03/2023 1056   HGB 10.2 (L) 10/03/2023 1056   HGB 10.9 (L) 06/07/2023 1240   HCT 30.4 (L) 10/03/2023 1056  HCT 31.4 (L) 06/07/2023 1240   PLT DCLMP 10/03/2023 1056   PLT 59 (LL) 06/07/2023 1240   MCV 109.4 (H) 10/03/2023 1056   MCV 104 (H) 06/07/2023 1240   MCH 36.7 (H) 10/03/2023 1056   MCHC 33.6 10/03/2023 1056   RDW 14.8 10/03/2023 1056   RDW 13.1 06/07/2023 1240   LYMPHSABS 1.1 10/03/2023 1056   LYMPHSABS 1.8 09/14/2018 1514   MONOABS 0.5 10/03/2023 1056   EOSABS 0.2 10/03/2023 1056   EOSABS 0.6 (H) 09/14/2018 1514   BASOSABS 0.1 10/03/2023 1056   BASOSABS 0.1 09/14/2018 1514    CMP      Component Value Date/Time   NA 133 (L) 10/03/2023 1056   NA 137 06/07/2023 1240   K 4.4 10/03/2023 1056   CL 105 10/03/2023 1056   CO2 23 10/03/2023 1056   GLUCOSE 159 (H) 10/03/2023 1056   BUN 16 10/03/2023 1056   BUN 12 06/07/2023 1240   CREATININE 1.11 10/03/2023 1056   CALCIUM 8.3 (L) 10/03/2023 1056   PROT 6.4 (L) 10/10/2023 1233   PROT 6.5 06/07/2023 1240   ALBUMIN 1.9 (L) 10/10/2023 1233   ALBUMIN 2.7 (L) 06/07/2023 1240   AST 78 (H) 10/10/2023 1233   ALT 47 (H) 10/10/2023 1233   ALKPHOS 117  10/10/2023 1233   BILITOT 6.6 (H) 10/10/2023 1233   BILITOT 2.8 (H) 06/07/2023 1240   GFRNONAA >60 10/03/2023 1056   GFRAA >60 04/21/2020 0831     Lab Results  Component Value Date   CEA1 3.7 10/03/2023   /  CEA  Date Value Ref Range Status  10/03/2023 3.7 0.0 - 4.7 ng/mL Final    Comment:    (NOTE)                             Nonsmokers          <3.9                             Smokers             <5.6 [employer] Electrochemiluminescence Immunoassay (ECLIA) Values obtained with different assay methods or kits cannot be used interchangeably.  Results cannot be interpreted as absolute evidence of the presence or absence of malignant disease. Performed At: [facility] [address] [place], [place] [id number] Jolene Schimke MD WU:[id number]    No results found for: "PSA1" No results found for: "CAN199" No results found for: "CAN125"  No results found for: "TOTALPROTELP", "ALBUMINELP", "A1GS", "A2GS", "BETS", "BETA2SER", "GAMS", "MSPIKE", "SPEI" Lab Results  Component Value Date   TIBC 143 (L) 10/03/2023   TIBC 204 (L) 09/27/2022   FERRITIN 193 10/03/2023   IRONPCTSAT 92 (H) 10/03/2023   IRONPCTSAT 57 (H) 09/27/2022   Lab Results  Component Value Date   LDH 144 10/08/2019   LDH 142 06/21/2019   LDH 140 01/02/2019     STUDIES:   ECHO TEE Result Date: 10-13-23    TRANSESOPHOGEAL ECHO REPORT   Patient Name:   DEKLIN BIELER Date of Exam: 09/23/2023 Medical Rec #:  [id number]         Height:       72.0 in Accession #:    [id number]        Weight:       196.2 lb Date of Birth:  Jan 15, 1941         BSA:  2.113 m Patient Age:    82 years          BP:           141/58 mmHg Patient Gender: M                 HR:           72 bpm. [facility] Procedure: Transesophageal Echo, Cardiac Doppler and Color Doppler (Both            Spectral and Color Flow Doppler were utilized during procedure). Indications:     Bacteremia  History:          Patient has prior history of Echocardiogram examinations, most                  recent 09/21/2023. Abnormal ECG, Arrythmias:Atrial Fibrillation,                  Signs/Symptoms:Fever and Bacteremia; Risk Factors:Diabetes and                  Hypertension. Cancer.  Sonographer:     Sheralyn Boatman RDCS Referring Phys:  [id number] Ellsworth Lennox Diagnosing Phys: Winfield Rast Mallipeddi PROCEDURE: After discussion of the risks and benefits of a TEE, an informed consent was obtained from the patient. The transesophogeal probe was passed without difficulty through the esophogus of the patient. Imaged were obtained with the patient in a left lateral decubitus position. Local oropharyngeal anesthetic was provided with Cetacaine. Sedation performed by different physician. The patient was monitored while under deep sedation. Anesthestetic sedation was provided intravenously by Anesthesiology: 372mg  of Propofol, 100mg  of Lidocaine. Image quality was good. The patient's vital signs; including heart rate, blood pressure, and oxygen saturation; remained stable throughout the procedure. The patient developed no complications during  the procedure.  IMPRESSIONS  1. Left ventricular ejection fraction, by estimation, is 55 to 60%. The left ventricle has normal function. Left ventricular diastolic function could not be evaluated.  2. Right ventricular systolic function is normal. The right ventricular size is normal.  3. No left atrial/left atrial appendage thrombus was detected. The LAA emptying velocity was 26 cm/s.  4. Moderate pleural effusion in both left and right lateral regions.  5. The mitral valve is normal in structure. Trivial mitral valve regurgitation. No evidence of mitral stenosis.  6. The aortic valve is tricuspid. Aortic valve regurgitation is not visualized. No aortic stenosis is present. Conclusion(s)/Recommendation(s): No evidence of vegetation/infective endocarditis on this transesophageael echocardiogram. FINDINGS   Left Ventricle: Left ventricular ejection fraction, by estimation, is 55 to 60%. The left ventricle has normal function. Strain imaging was not performed. The left ventricular internal cavity size was normal in size. There is no left ventricular hypertrophy. Left ventricular diastolic function could not be evaluated. Right Ventricle: The right ventricular size is normal. No increase in right ventricular wall thickness. Right ventricular systolic function is normal. Left Atrium: Left atrial size was not assessed. No left atrial/left atrial appendage thrombus was detected. The LAA emptying velocity was 26 cm/s. Right Atrium: Right atrial size was not assessed. Pericardium: Trivial pericardial effusion is present. The pericardial effusion is anterior to the right ventricle. Mitral Valve: The mitral valve is normal in structure. Trivial mitral valve regurgitation. No evidence of mitral valve stenosis. Tricuspid Valve: The tricuspid valve is normal in structure. Tricuspid valve regurgitation is trivial. No evidence of tricuspid stenosis. Aortic Valve: The aortic valve is tricuspid. Aortic valve regurgitation is not visualized.  No aortic stenosis is present. Aortic valve mean gradient measures 5.0 mmHg. Aortic valve peak gradient measures 8.1 mmHg. Pulmonic Valve: The pulmonic valve was not well visualized. Pulmonic valve regurgitation is not visualized. Aorta: The aortic root and ascending aorta are structurally normal, with no evidence of dilitation. IAS/Shunts: No atrial level shunt detected by color flow Doppler. Additional Comments: 3D imaging was not performed. There is a moderate pleural effusion in both left and right lateral regions. Mild ascites is present. Spectral Doppler performed. AORTIC VALVE AV Vmax:           142.00 cm/s AV Vmean:          101.000 cm/s AV VTI:            0.299 m AV Peak Grad:      8.1 mmHg AV Mean Grad:      5.0 mmHg LVOT Vmax:         88.60 cm/s LVOT Vmean:        53.100 cm/s LVOT VTI:           0.154 m LVOT/AV VTI ratio: 0.52  SHUNTS Systemic VTI: 0.15 m Vishnu Priya Mallipeddi Electronically signed by Winfield Rast Mallipeddi Signature Date/Time: 09/26/2023/12:44:51 PM    Final    DG CHEST PORT 1 VIEW Result Date: 09/24/2023 CLINICAL DATA:  PICC line placement. EXAM: PORTABLE CHEST 1 VIEW COMPARISON:  09/21/2023 FINDINGS: Right arm PICC line is identified with tip at the superior cavoatrial junction the heart size appears within normal limits. Lung volumes are low. Blunting of the costophrenic angles. Mild diffuse increase interstitial markings noted which may reflect pulmonary vascular congestion or early pulmonary edema. Atelectasis noted in the right base. IMPRESSION: 1. Right arm PICC line tip is at the superior cavoatrial junction. 2. Low lung volumes. 3. Mild diffuse increase interstitial markings may reflect pulmonary vascular congestion or early pulmonary edema. 4. Blunting of the costophrenic angles suggest small residual pleural effusions. Electronically Signed   By: Signa Kell M.D.   On: 09/24/2023 12:13   [place] EKG SITE RITE Result Date: 09/23/2023 If Site Rite image not attached, placement could not be confirmed due to current cardiac rhythm.  ECHOCARDIOGRAM COMPLETE Result Date: 09/21/2023    ECHOCARDIOGRAM REPORT   Patient Name:   Jose Lawrence Date of Exam: 09/21/2023 Medical Rec #:  [id number]         Height:       72.0 in Accession #:    [id number]        Weight:       196.2 lb Date of Birth:  [birth year]         BSA:          2.113 m Patient Age:    82 years          BP:           120/59 mmHg Patient Gender: M                 HR:           41 bpm. [facility] Procedure: 2D Echo, Color Doppler and Cardiac Doppler (Both Spectral and Color            Flow Doppler were utilized during procedure). Indications:    Endocarditis [[id number]]  History:        Patient has no prior history of Echocardiogram examinations and  Patient has prior history of  Echocardiogram examinations, most                 recent 05/30/2023. Arrythmias:Atrial Fibrillation; Risk                 Factors:Hypertension and Diabetes.  Sonographer:    Webb Laws Referring Phys: [id number] [facility] SINGH IMPRESSIONS  1. Left ventricular ejection fraction, by estimation, is 55 to 60%. The left ventricle has normal function. The left ventricle has no regional wall motion abnormalities. Left ventricular diastolic parameters are indeterminate.  2. Right ventricular systolic function is low normal. The right ventricular size is mildly enlarged. There is moderately elevated pulmonary artery systolic pressure. The estimated right ventricular systolic pressure is 46.1 mmHg.  3. Left atrial size was upper normal.  4. Right atrial size was mildly dilated.  5. Ascitic fluid and left pleural effusion noted.  6. The mitral valve is grossly normal. Mild mitral valve regurgitation.  7. The aortic valve is tricuspid. There is mild calcification of the aortic valve. Aortic valve regurgitation is not visualized. Aortic valve sclerosis is present, with no evidence of aortic valve stenosis.  8. The inferior vena cava is dilated in size with <50% respiratory variability, suggesting right atrial pressure of 15 mmHg. Comparison(s): Prior images reviewed side by side. No obvious valvular vegetations. FINDINGS  Left Ventricle: Left ventricular ejection fraction, by estimation, is 55 to 60%. The left ventricle has normal function. The left ventricle has no regional wall motion abnormalities. Strain imaging was not performed. The left ventricular internal cavity  size was normal in size. There is no left ventricular hypertrophy. Left ventricular diastolic function could not be evaluated due to atrial fibrillation. Left ventricular diastolic parameters are indeterminate. Right Ventricle: The right ventricular size is mildly enlarged. No increase in right ventricular wall thickness. Right ventricular systolic function  is low normal. There is moderately elevated pulmonary artery systolic pressure. The tricuspid regurgitant  velocity is 2.79 m/s, and with an assumed right atrial pressure of 15 mmHg, the estimated right ventricular systolic pressure is 46.1 mmHg. Left Atrium: Left atrial size was upper normal. Right Atrium: Right atrial size was mildly dilated. Pericardium: Ascitic fluid and left pleural effusion noted. There is no evidence of pericardial effusion. Mitral Valve: The mitral valve is grossly normal. Mild mitral valve regurgitation. Tricuspid Valve: The tricuspid valve is grossly normal. Tricuspid valve regurgitation is trivial. Aortic Valve: The aortic valve is tricuspid. There is mild calcification of the aortic valve. There is mild aortic valve annular calcification. Aortic valve regurgitation is not visualized. Aortic valve sclerosis is present, with no evidence of aortic valve stenosis. Pulmonic Valve: The pulmonic valve was grossly normal. Pulmonic valve regurgitation is trivial. Aorta: The aortic root is normal in size and structure. Venous: The inferior vena cava is dilated in size with less than 50% respiratory variability, suggesting right atrial pressure of 15 mmHg. IAS/Shunts: No atrial level shunt detected by color flow Doppler. Additional Comments: 3D imaging was not performed.  LEFT VENTRICLE PLAX 2D LVIDd:         4.60 cm   Diastology LVIDs:         2.90 cm   LV e' medial:    7.07 cm/s LV PW:         1.00 cm   LV E/e' medial:  14.4 LV IVS:        0.90 cm   LV e' lateral:   10.60 cm/s LVOT diam:  1.80 cm   LV E/e' lateral: 9.6 LV SV:         55 LV SV Index:   26 LVOT Area:     2.54 cm  RIGHT VENTRICLE            IVC RV Basal diam:  4.70 cm    IVC diam: 2.70 cm RV Mid diam:    3.80 cm RV S prime:     8.81 cm/s TAPSE (M-mode): 2.2 cm LEFT ATRIUM             Index        RIGHT ATRIUM           Index LA diam:        4.30 cm 2.03 cm/m   RA Area:     24.30 cm LA Vol (A2C):   52.6 ml 24.89 ml/m  RA  Volume:   75.30 ml  35.63 ml/m LA Vol (A4C):   66.3 ml 31.37 ml/m LA Biplane Vol: 60.3 ml 28.53 ml/m  AORTIC VALVE LVOT Vmax:   83.90 cm/s LVOT Vmean:  60.500 cm/s LVOT VTI:    0.215 m  AORTA Ao Root diam: 3.30 cm MITRAL VALVE                TRICUSPID VALVE MV Area (PHT): 2.77 cm     TR Peak grad:   31.1 mmHg MV Decel Time: 274 msec     TR Vmax:        279.00 cm/s MV E velocity: 102.00 cm/s                             SHUNTS                             Systemic VTI:  0.22 m                             Systemic Diam: 1.80 cm Nona Dell MD Electronically signed by Nona Dell MD Signature Date/Time: 09/21/2023/3:46:39 PM    Final    DG Chest 1 View Result Date: 09/21/2023 CLINICAL DATA:  Right pleural effusion, status post thoracentesis EXAM: CHEST  1 VIEW COMPARISON:  09/19/2023 FINDINGS: Nearly resolved right effusion following thoracentesis. Improved right basilar aeration. Persistent low lung volumes with bibasilar atelectasis/opacities. No pneumothorax. Trachea midline. Left subclavian power port catheter tip proximal SVC as before. Aorta atherosclerotic. IMPRESSION: Nearly resolved right effusion following thoracentesis. No pneumothorax. Electronically Signed   By: Judie Petit.  Shick M.D.   On: 09/21/2023 11:07   US THORACENTESIS ASP PLEURAL SPACE W/IMG GUIDE Result Date: 09/21/2023 INDICATION: Colon cancer, shortness of breath, right pleural effusion EXAM: ULTRASOUND GUIDED RIGHT THORACENTESIS MEDICATIONS: 1% lidocaine local COMPLICATIONS: None immediate. PROCEDURE: An ultrasound guided thoracentesis was thoroughly discussed with the patient and questions answered. The benefits, risks, alternatives and complications were also discussed. The patient understands and wishes to proceed with the procedure. Written consent was obtained. Ultrasound was performed to localize and mark an adequate pocket of fluid in the right chest. The area was then prepped and draped in the normal sterile fashion. 1% Lidocaine  was used for local anesthesia. Under ultrasound guidance a 6 Fr Safe-T-Centesis catheter was introduced. Thoracentesis was performed. The catheter was removed and a dressing applied. FINDINGS: A total of approximately 2.1 L of clear pleural fluid was removed. Samples  were sent to the laboratory as requested by the clinical team. IMPRESSION: Successful ultrasound guided right thoracentesis yielding 2.1 L of pleural fluid. Electronically Signed   By: Judie Petit.  Shick M.D.   On: 09/21/2023 11:04   CT CHEST ABDOMEN PELVIS W CONTRAST Result Date: 09/19/2023 CLINICAL DATA:  Sepsis. c/o cough, congestion, fever and flu-like symptoms since Saturday EXAM: CT CHEST, ABDOMEN, AND PELVIS WITH CONTRAST TECHNIQUE: Multidetector CT imaging of the chest, abdomen and pelvis was performed following the standard protocol during bolus administration of intravenous contrast. RADIATION DOSE REDUCTION: This exam was performed according to the departmental dose-optimization program which includes automated exposure control, adjustment of the mA and/or kV according to patient size and/or use of iterative reconstruction technique. CONTRAST:  OMNIPAQUE IOHEXOL 300 MG/ML  SOLN COMPARISON:  Chest x-ray 09/19/2023, CT abdomen pelvis 04/22/2023, CT abdomen pelvis 04/04/2023, CT abdomen pelvis 10/03/2021 FINDINGS: CT CHEST FINDINGS Cardiovascular: Mildly enlarged right heart size. No significant pericardial effusion. The thoracic aorta is normal in caliber. Mild atherosclerotic plaque of the thoracic aorta. Four-vessel coronary artery calcifications. Aortic valve leaflet calcification. Mediastinum/Nodes: No enlarged mediastinal, hilar, or axillary lymph nodes. Thyroid gland, trachea, and esophagus demonstrate no significant findings. Lungs/Pleura: Bilateral lower lobe passive atelectasis. Almost complete collapse of the right lower lobe. No focal consolidation. No pulmonary nodule. No pulmonary mass. Interval increase in size of a moderate right  and small left pleural effusion. No pneumothorax. Musculoskeletal: No chest wall abnormality.  Bilateral gynecomastia. No suspicious lytic or blastic osseous lesions. No acute displaced fracture. Multilevel degenerative changes of the spine. CT ABDOMEN PELVIS FINDINGS Hepatobiliary: Nodular hepatic contour. No focal liver abnormality. Status post cholecystectomy. No biliary dilatation. Pancreas: Diffusely atrophic. No focal lesion. Otherwise normal pancreatic contour. No surrounding inflammatory changes. No main pancreatic ductal dilatation. Spleen: Normal in size without focal abnormality. Adrenals/Urinary Tract: No adrenal nodule bilaterally. Bilateral kidneys enhance symmetrically. Question mild left urothelial thickening. No hydronephrosis. No hydroureter.  No nephroureterolithiasis. The urinary bladder is unremarkable. On delayed imaging, there is no urothelial wall thickening and there are no filling defects in the opacified portions of the bilateral collecting systems or ureters. 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 Rectal surgical changes with Redemonstration of extensive presacral mesenteric fat stranding and soft tissue density. Similar orientation of several loops of the small bowel within the left anterior abdomen suggestive of possible underlying adhesions. No associated obstruction. Stomach is within normal limits. Majority of the small bowel is decompressed. No evidence of bowel wall thickening or dilatation. No pneumatosis. Appendix appears normal. Vascular/Lymphatic: Recanalized paraumbilical vein. Paraesophageal varices. The portal, splenic, superior mesenteric veins are patent. No abdominal aorta or iliac aneurysm. Mild atherosclerotic plaque of the aorta and its branches. No abdominal, pelvic, or inguinal lymphadenopathy. Reproductive: Prostate is unremarkable. Other: Grossly stable small volume simple free fluid ascites. Associated diffuse mild mesenteric edema. No  intraperitoneal free gas. No organized fluid collection. Musculoskeletal: No abdominal wall hernia or abnormality. No suspicious lytic or blastic osseous lesions. No acute displaced fracture. Multilevel degenerative changes of the spine. IMPRESSION: 1. Interval increase in size of a moderate right and small left pleural effusion. Almost complete collapse of the right lower lobe. 2. Mild right cardiomegaly. 3. Question mild left urothelial thickening. Correlate with urinalysis for infection. 4. Cirrhosis with portal hypertension. No focal liver lesions identified. Please note that liver protocol enhanced MR and CT are the most sensitive tests for the screening detection of hepatocellular carcinoma in the high risk setting of cirrhosis. 5. Grossly  stable small volume simple free fluid ascites. 6. 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 Rectal surgical changes with Redemonstration of extensive presacral mesenteric fat stranding and soft tissue density. Markedly limited evaluation of this region. 7. Aortic Atherosclerosis (ICD10-I70.0) including four-vessel coronary artery and aortic valve leaflet calcifications-correlate for aortic stenosis. Electronically Signed   By: Tish Frederickson M.D.   On: 09/19/2023 19:39   DG Chest 2 View Result Date: 09/19/2023 CLINICAL DATA:  Cough EXAM: CHEST - 2 VIEW COMPARISON:  06/08/2023 FINDINGS: Underinflation. Enlarged cardiopericardial silhouette. Central vascular prominence. Developing bilateral small pleural effusion with adjacent opacity, right-greater-than-left. Recommend follow-up. No pneumothorax. Left upper chest subclavian port. Tip overlying the central SVC. Degenerative changes of the spine. IMPRESSION: Developing pleural effusions with adjacent opacity, right-greater-than-left. Recommend follow-up. Persistent enlarged cardiac silhouette with vascular congestion. Chest port Electronically Signed   By: Karen Kays M.D.   On: 09/19/2023 17:39

## 2023-10-10 ENCOUNTER — Inpatient Hospital Stay: Payer: Medicare Other | Attending: Hematology | Admitting: Hematology

## 2023-10-10 ENCOUNTER — Inpatient Hospital Stay

## 2023-10-10 VITALS — BP 155/68 | HR 55 | Temp 97.5°F | Resp 18 | Ht 72.0 in | Wt 193.0 lb

## 2023-10-10 DIAGNOSIS — C184 Malignant neoplasm of transverse colon: Secondary | ICD-10-CM | POA: Diagnosis not present

## 2023-10-10 DIAGNOSIS — R17 Unspecified jaundice: Secondary | ICD-10-CM

## 2023-10-10 DIAGNOSIS — D696 Thrombocytopenia, unspecified: Secondary | ICD-10-CM | POA: Insufficient documentation

## 2023-10-10 DIAGNOSIS — Z86711 Personal history of pulmonary embolism: Secondary | ICD-10-CM | POA: Insufficient documentation

## 2023-10-10 DIAGNOSIS — Z85038 Personal history of other malignant neoplasm of large intestine: Secondary | ICD-10-CM | POA: Insufficient documentation

## 2023-10-10 DIAGNOSIS — D508 Other iron deficiency anemias: Secondary | ICD-10-CM | POA: Diagnosis not present

## 2023-10-10 DIAGNOSIS — E538 Deficiency of other specified B group vitamins: Secondary | ICD-10-CM | POA: Diagnosis not present

## 2023-10-10 DIAGNOSIS — Z299 Encounter for prophylactic measures, unspecified: Secondary | ICD-10-CM | POA: Diagnosis not present

## 2023-10-10 DIAGNOSIS — Z933 Colostomy status: Secondary | ICD-10-CM | POA: Insufficient documentation

## 2023-10-10 DIAGNOSIS — K9400 Colostomy complication, unspecified: Secondary | ICD-10-CM | POA: Diagnosis not present

## 2023-10-10 DIAGNOSIS — D509 Iron deficiency anemia, unspecified: Secondary | ICD-10-CM | POA: Diagnosis not present

## 2023-10-10 DIAGNOSIS — K3189 Other diseases of stomach and duodenum: Secondary | ICD-10-CM | POA: Diagnosis not present

## 2023-10-10 DIAGNOSIS — I10 Essential (primary) hypertension: Secondary | ICD-10-CM | POA: Diagnosis not present

## 2023-10-10 DIAGNOSIS — E559 Vitamin D deficiency, unspecified: Secondary | ICD-10-CM | POA: Insufficient documentation

## 2023-10-10 DIAGNOSIS — K7460 Unspecified cirrhosis of liver: Secondary | ICD-10-CM | POA: Diagnosis not present

## 2023-10-10 LAB — HEPATIC FUNCTION PANEL
ALT: 47 U/L — ABNORMAL HIGH (ref 0–44)
AST: 78 U/L — ABNORMAL HIGH (ref 15–41)
Albumin: 1.9 g/dL — ABNORMAL LOW (ref 3.5–5.0)
Alkaline Phosphatase: 117 U/L (ref 38–126)
Bilirubin, Direct: 2.6 mg/dL — ABNORMAL HIGH (ref 0.0–0.2)
Indirect Bilirubin: 4 mg/dL — ABNORMAL HIGH (ref 0.3–0.9)
Total Bilirubin: 6.6 mg/dL — ABNORMAL HIGH (ref 0.0–1.2)
Total Protein: 6.4 g/dL — ABNORMAL LOW (ref 6.5–8.1)

## 2023-10-10 LAB — AMMONIA: Ammonia: 22 umol/L (ref 9–35)

## 2023-10-10 NOTE — Patient Instructions (Addendum)
 [facility] at [facility] Discharge Instructions   You were seen and examined today by Dr. Ellin Saba.  He reviewed the results of your lab work which are normal/stable.   There was no evidence of cancer seen on your recent CT scan.   We will see you back in 6 months. We will repeat lab work and    Return as scheduled.    Thank you for choosing [facility] at [facility] to provide your oncology and hematology care.  To afford each patient quality time with our provider, please arrive at least 15 minutes before your scheduled appointment time.   If you have a lab appointment with the Cancer Center please come in thru the Main Entrance and check in at the main information desk.  You need to re-schedule your appointment should you arrive 10 or more minutes late.  We strive to give you quality time with our providers, and arriving late affects you and other patients whose appointments are after yours.  Also, if you no show three or more times for appointments you may be dismissed from the clinic at the providers discretion.     Again, thank you for choosing [facility].  Our hope is that these requests will decrease the amount of time that you wait before being seen by our physicians.       _____________________________________________________________  Should you have questions after your visit to [facility], please contact our office at [phone] and follow the prompts.  Our office hours are 8:00 a.m. and 4:30 p.m. Monday - Friday.  Please note that voicemails left after 4:00 p.m. may not be returned until the following business day.  We are closed weekends and major holidays.  You do have access to a nurse 24-7, just call the main number to the clinic [phone] and do not press any options, hold on the line and a nurse will answer the phone.    For prescription refill requests, have your pharmacy contact our  office and allow 72 hours.    Due to Covid, you will need to wear a mask upon entering the hospital. If you do not have a mask, a mask will be given to you at the Main Entrance upon arrival. For doctor visits, patients may have 1 support person age 65 or older with them. For treatment visits, patients can not have anyone with them due to social distancing guidelines and our immunocompromised population.

## 2023-10-12 ENCOUNTER — Ambulatory Visit (HOSPITAL_COMMUNITY)
Admission: RE | Admit: 2023-10-12 | Discharge: 2023-10-12 | Disposition: A | Discharge status: Home / Self Care | Payer: Medicare Other | Source: Ambulatory Visit | Attending: Gastroenterology | Admitting: Gastroenterology

## 2023-10-12 DIAGNOSIS — J90 Pleural effusion, not elsewhere classified: Secondary | ICD-10-CM | POA: Diagnosis not present

## 2023-10-12 DIAGNOSIS — K7460 Unspecified cirrhosis of liver: Secondary | ICD-10-CM | POA: Diagnosis not present

## 2023-10-12 DIAGNOSIS — R188 Other ascites: Secondary | ICD-10-CM | POA: Diagnosis not present

## 2023-10-12 DIAGNOSIS — K7689 Other specified diseases of liver: Secondary | ICD-10-CM | POA: Diagnosis not present

## 2023-10-13 ENCOUNTER — Other Ambulatory Visit: Payer: Self-pay

## 2023-10-13 ENCOUNTER — Encounter: Payer: Self-pay | Admitting: Infectious Diseases

## 2023-10-13 ENCOUNTER — Ambulatory Visit: Payer: Medicare Other | Admitting: Infectious Diseases

## 2023-10-13 VITALS — BP 153/78 | HR 68 | Temp 97.4°F | Wt 196.0 lb

## 2023-10-13 DIAGNOSIS — Z79899 Other long term (current) drug therapy: Secondary | ICD-10-CM | POA: Insufficient documentation

## 2023-10-13 DIAGNOSIS — Z1159 Encounter for screening for other viral diseases: Secondary | ICD-10-CM | POA: Insufficient documentation

## 2023-10-13 DIAGNOSIS — B958 Unspecified staphylococcus as the cause of diseases classified elsewhere: Secondary | ICD-10-CM | POA: Diagnosis not present

## 2023-10-13 DIAGNOSIS — Z452 Encounter for adjustment and management of vascular access device: Secondary | ICD-10-CM | POA: Insufficient documentation

## 2023-10-13 DIAGNOSIS — R7881 Bacteremia: Secondary | ICD-10-CM | POA: Diagnosis not present

## 2023-10-13 NOTE — Progress Notes (Addendum)
 Patient Active Problem List   Diagnosis Date Noted   Bacteremia due to Staphylococcus 09/23/2023   Vomiting 09/20/2023   Pleural effusion 09/20/2023   Hypoalbuminemia due to protein-calorie malnutrition (HCC) 09/20/2023   Acute febrile illness 09/19/2023   Bacteremia due to group B Streptococcus/Agalactiae 06/09/2023   UTI due to Klebsiella species 06/09/2023   Fever 06/08/2023   UTI (urinary tract infection) 06/08/2023   Hematuria 06/08/2023   Secondary esophageal varices without bleeding (HCC) 05/10/2023   Streptococcal bacteremia 04/23/2023   Urinary retention 04/22/2023   Cirrhosis of liver (HCC) 04/22/2023   Hypomagnesemia 04/22/2023   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   Cirrhosis of liver without ascites (HCC) 04/13/2023   Elevated LFTs 09/16/2022   Acute GI bleeding 10/03/2021   Elevated AST (SGOT)    Hypotension due to hypovolemia    Acute renal failure 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) 12/24/2020   Thrombocytopenia (HCC) 12/24/2020   Lactic acidosis 12/24/2020   Permanent atrial fibrillation (HCC) 12/24/2020   Uncontrolled type 2 diabetes mellitus with hyperglycemia, without long-term current use of insulin (HCC) 12/24/2020   Elevated lactic acid level    Hyperkalemia    Hyponatremia    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   AKI (acute kidney injury) (HCC) 12/23/2020   Lumbar spondylosis 08/26/2020   Body mass index (BMI) 27.0-27.9, adult 07/02/2020   Elevated blood-pressure reading, without diagnosis of hypertension 07/02/2020   Herniated nucleus pulposus, lumbar 06/30/2020   Spondylitis (HCC) 06/30/2020   Lumbar radiculopathy 06/23/2020   Spondylolisthesis at L5-S1 level 06/23/2020   Vitamin B 12 deficiency 07/10/2019   Macrocytic anemia 09/14/2018   Malaise and fatigue 09/14/2018   Indwelling Foley catheter present 09/14/2018   Intraoperative bladder injury 09/04/2018   Postoperative anemia due to acute blood  loss 09/04/2018   Cancer of transverse colon (HCC)    Iron deficiency anemia due to chronic blood loss 08/25/2018   Acquired hypothyroidism 08/25/2018   HTN (hypertension) 08/25/2018   Controlled type 2 diabetes mellitus without complication (HCC) 08/25/2018   Colostomy in place [facility]) 08/25/2018   BPH (benign prostatic hyperplasia) 08/25/2018   Atrial fibrillation, chronic (HCC) 08/25/2018   Mass of colon 08/21/2018   S/P exploratory laparotomy 11/11/2015   Pneumoperitoneum 10/31/2015   Colon cancer (HCC) 01/12/2007    Patient's Medications  New Prescriptions   No medications on file  Previous Medications   DAPTOMYCIN (CUBICIN) 500 MG INJECTION    Inject into the vein.   FUROSEMIDE (LASIX) 40 MG TABLET    Take 20-40 mg by mouth daily as needed.   LACTOSE FREE NUTRITION (BOOST) LIQD    Take 237 mLs by mouth daily at 6 (six) AM.   LEVOTHYROXINE (SYNTHROID, LEVOTHROID) 50 MCG TABLET    Take 50 mcg by mouth daily before breakfast.   LOPERAMIDE (IMODIUM) 2 MG CAPSULE    Take 2 capsules (4 mg total) by mouth 4 (four) times daily -  before meals and at bedtime.   POTASSIUM CHLORIDE (KLOR-CON) 10 MEQ TABLET    Take 1 tablet (10 mEq total) by mouth daily as needed. Take when you take Lasix/Furosemide   TAMSULOSIN (FLOMAX) 0.4 MG CAPS CAPSULE    Take 1 capsule (0.4 mg total) by mouth daily after supper.   VITAMIN D, ERGOCALCIFEROL, (DRISDOL) 1.25 MG (50000 UNIT) CAPS CAPSULE    Take 1 capsule by mouth once a week  Modified Medications   No medications on file  Discontinued Medications   No  medications on file    Subjective: Discussed the use of AI scribe software for clinical note transcription with the patient, who gave verbal consent to proceed.   83 Y O Male with prior h/o PE, BPH, Hypothyroidism, Type 2 DM, colon ca sp resection/colostomy ( follows Dr Ellin Saba) and liver cirrhosis, prior strep agalactiae bacteremia in 05/2023 s/p tx who is here for HFU in the setting of staph  lugdunensis bacteremia. Work up was remarkable for CT CAP interval increase in size of a moderate right and small left pleural effusion. Almost complete collapse of the RLL. Cirrhosis and portal HTN. TTE and TTE negative for vegetations. Blood cx + for MR staph lugdunensis on 2/10. Blood cx cleared on 2/11, port removed 2/14 ( cx MRSE, contaminant). PICC placed 2/15 and discharged to complete 2 weeks of IV daptomycin through 2/28. Patient also underwent thoracentesis 2/12. Cx NG  3/6 Completed IV abtx, port removed. Denies fevers but reports feeling cold all the time, despite a warm ambient temperature, and a propensity to sleep excessively. He denies any fever, but did experience dry heaves the previous night. He denies any chest pain, cough, or shortness of breath. His appetite has not improved. He reports following Oncology as well as Gastroenterology for his liver cirrhosis. Overall he feels well. He is unsure if he has been tested for hepatitis B and C and willing to be tested today.   Review of Systems: all systems reviewed with pertinent positives and negatives as listed above  Past Medical History:  Diagnosis Date   Anemia    Causing interruption in anticoagulation   Atrial fibrillation and flutter (HCC)    BPH (benign prostatic hypertrophy)    Colon cancer (HCC)    Status post LAR 2008   Dysrhythmia    Hypothyroidism    Lacunar infarction (HCC) 11/2012   RT 3rd NERVE PALSY, SB Dr. Lita Mains   PE (pulmonary embolism)    Temporarily on Eliquis 2017   Skin cancer    35 years ago   Type 2 diabetes mellitus (HCC)    Vitamin B 12 deficiency 07/10/2019   Past Surgical History:  Procedure Laterality Date   ABDOMINOPERINEAL PROCTOCOLECTOMY  2009   end colostomy    BIOPSY  08/25/2018   Procedure: BIOPSY;  Surgeon: Malissa Hippo, MD;  [facility];  Service: Endoscopy;;   BIOPSY  10/04/2021   Procedure: BIOPSY;  Surgeon: Malissa Hippo, MD;  [facility];   Service: Endoscopy;;  small, bowel   BLADDER REPAIR  08/29/2018   Procedure: BLADDER REPAIR;  Surgeon: Lucretia Roers, MD;  [facility]: [facility];  Service: General;;   COLECTOMY  2008   COLONOSCOPY WITH PROPOFOL N/A 08/25/2018   Procedure: COLONOSCOPY WITH PROPOFOL;  Surgeon: Malissa Hippo, MD;  [facility];  Service: Endoscopy;  Laterality: N/A;  2:20   COLOSTOMY     ESOPHAGOGASTRODUODENOSCOPY (EGD) WITH PROPOFOL N/A 05/10/2023   Procedure: ESOPHAGOGASTRODUODENOSCOPY (EGD) WITH PROPOFOL;  Surgeon: Dolores Frame, MD;  [facility];  Service: Gastroenterology;  Laterality: N/A;  11:00AM;ASA 3   EXPLORATORY LAPAROTOMY  2017   ? pneumoperitoneum of unknown etiology/ negative Ex lap   GIVENS CAPSULE STUDY  10/04/2021   Procedure: GIVENS CAPSULE STUDY;  Surgeon: Malissa Hippo, MD;  [facility];  Service: Endoscopy;;   ILEOSCOPY N/A 10/04/2021   Procedure: ILEOSCOPY THROUGH STOMA;  Surgeon: Malissa Hippo, MD;  [facility];  Service: Endoscopy;  Laterality: N/A;   ILEOSTOMY  08/29/2018   Procedure: ILEOSTOMY;  Surgeon: Lucretia Roers, MD;  Location: [facility];  Service: General;;   LAPAROSCOPIC CHOLECYSTECTOMY  2007   LYSIS OF ADHESION  08/29/2018   Procedure: LYSIS OF ADHESION;  Surgeon: Lucretia Roers, MD;  [facility];  Service: General;;   PARTIAL COLECTOMY N/A 08/29/2018   Procedure: PARTIAL COLECTOMY;  Surgeon: Lucretia Roers, MD;  [facility];  Service: General;  Laterality: N/A;   PORT-A-CATH REMOVAL Left 09/23/2023   Procedure: MINOR REMOVAL PORT-A-CATH;  Surgeon: Marjo Bicker, MD;  Location: [facility];  Service: Cardiovascular;  Laterality: Left;   PORTACATH PLACEMENT Left 10/04/2018   Procedure: INSERTION PORT-A-CATH (attached catheter left subclavian);  Surgeon: Lucretia Roers, MD;  [facility];  Service: General;  Laterality: Left;   SBO SURGERY  06/2009   TEE WITHOUT CARDIOVERSION N/A  09/23/2023   Procedure: TRANSESOPHAGEAL ECHOCARDIOGRAM (TEE);  Surgeon: Marjo Bicker, MD;  [facility];  Service: Cardiovascular;  Laterality: N/A;     Social History   Tobacco Use   Smoking status: Former    Current packs/day: 0.00    Average packs/day: 1 pack/day for 27.0 years (27.0 ttl pk-yrs)    Types: Cigarettes    Start date: 01/27/1964    Quit date: 01/27/1991    Years since quitting: 32.7   Smokeless tobacco: Former    Types: Chew    Quit date: 08/10/1995  Vaping Use   Vaping status: Never Used  Substance Use Topics   Alcohol use: Never    Alcohol/week: 0.0 standard drinks of alcohol   Drug use: Never    Family History  Problem Relation Age of Onset   Heart disease Father    Heart failure Father    Heart attack Father    Prostate cancer Paternal Uncle     No Known Allergies  Health Maintenance  Topic Date Due   Pneumonia Vaccine 49+ Years old (1 of 2 - PCV) Never done   FOOT EXAM  Never done   OPHTHALMOLOGY EXAM  Never done   Diabetic kidney evaluation - Urine ACR  Never done   DTaP/Tdap/Td (1 - Tdap) Never done   Zoster Vaccines- Shingrix (1 of 2) Never done   COVID-19 Vaccine (3 - Moderna risk series) 10/24/2019   HEMOGLOBIN A1C  12/07/2023   Medicare Annual Wellness (AWV)  07/21/2024   Diabetic kidney evaluation - eGFR measurement  10/02/2024   INFLUENZA VACCINE  Completed   HPV VACCINES  Aged Out   Colonoscopy  Discontinued   Hepatitis C Screening  Discontinued    Objective: BP (!) 153/78   Pulse 68   Temp (!) 97.4 F (36.3 C) (Oral)   Wt 196 lb (88.9 kg)   SpO2 100%   BMI 26.58 kg/m    Physical Exam Constitutional:      Appearance: Normal appearance.  HENT:     Head: Normocephalic and atraumatic.      Mouth: Mucous membranes are moist. Mild icterus Eyes:    Conjunctiva/sclera: Conjunctivae normal.     Pupils: Pupils are equal, round, and b/l symmetrical    Cardiovascular:     Rate and Rhythm: Normal rate and regular  rhythm.     Heart sounds: s1s2   Pulmonary:     Effort: Pulmonary effort is normal.     Breath sounds: Normal breath sounds.   Abdominal:     General: Non distended     Palpations:  soft. Non tender, colostomy bag  Musculoskeletal:        General: Normal range of motion. ambulatory  Skin:    General: Skin is warm and dry.     Comments:  Neurological:     General: grossly non focal     Mental Status: awake, alert and oriented to person, place, and time.   Psychiatric:        Mood and Affect: Mood normal.   Lab Results Lab Results  Component Value Date   WBC 4.2 10/03/2023   HGB 10.2 (L) 10/03/2023   HCT 30.4 (L) 10/03/2023   MCV 109.4 (H) 10/03/2023   PLT DCLMP 10/03/2023    Lab Results  Component Value Date   CREATININE 1.11 10/03/2023   BUN 16 10/03/2023   NA 133 (L) 10/03/2023   K 4.4 10/03/2023   CL 105 10/03/2023   CO2 23 10/03/2023    Lab Results  Component Value Date   ALT 47 (H) 10/10/2023   AST 78 (H) 10/10/2023   ALKPHOS 117 10/10/2023   BILITOT 6.6 (H) 10/10/2023    No results found for: "CHOL", "HDL", "LDLCALC", "LDLDIRECT", "TRIG", "CHOLHDL" No results found for: "LABRPR", "RPRTITER" No results found for: "HIV1RNAQUANT", "HIV1RNAVL", "CD4TABS"  Microbiology Results for orders placed or performed during the hospital encounter of 09/19/23  Resp panel by RT-PCR (RSV, Flu A&B, Covid) Anterior Nasal Swab     Status: None   Collection Time: 09/19/23  2:34 PM   Specimen: Anterior Nasal Swab  Result Value Ref Range Status   SARS Coronavirus 2 by RT PCR NEGATIVE NEGATIVE Final    Comment: (NOTE) SARS-CoV-2 target nucleic acids are NOT DETECTED.  The SARS-CoV-2 RNA is generally detectable in upper respiratory specimens during the acute phase of infection. The lowest concentration of SARS-CoV-2 viral copies this assay can detect is 138 copies/mL. A negative result does not preclude SARS-Cov-2 infection and should not be used as the sole basis for  treatment or other patient management decisions. A negative result may occur with  improper specimen collection/handling, submission of specimen other than nasopharyngeal swab, presence of viral mutation(s) within the areas targeted by this assay, and inadequate number of viral copies(<138 copies/mL). A negative result must be combined with clinical observations, patient history, and epidemiological information. The expected result is Negative.  Fact Sheet for Patients:  BloggerCourse.com  Fact Sheet for Healthcare Providers:  SeriousBroker.it  This test is no t yet approved or cleared by the [place] FDA and  has been authorized for detection and/or diagnosis of SARS-CoV-2 by FDA under an Emergency Use Authorization (EUA). This EUA will remain  in effect (meaning this test can be used) for the duration of the COVID-19 declaration under Section 564(b)(1) of the Act, 21 U.S.C.section 360bbb-3(b)(1), unless the authorization is terminated  or revoked sooner.       Influenza A by PCR NEGATIVE NEGATIVE Final   Influenza B by PCR NEGATIVE NEGATIVE Final    Comment: (NOTE) The Xpert Xpress SARS-CoV-2/FLU/RSV plus assay is intended as an aid in the diagnosis of influenza from Nasopharyngeal swab specimens and should not be used as a sole basis for treatment. Nasal washings and aspirates are unacceptable for Xpert Xpress SARS-CoV-2/FLU/RSV testing.  Fact Sheet for Patients: BloggerCourse.com  Fact Sheet for Healthcare Providers: SeriousBroker.it  This test is not yet approved or cleared by the [place] FDA and has been authorized for detection and/or diagnosis of SARS-CoV-2 by FDA under an Emergency Use Authorization (EUA). This EUA  will remain in effect (meaning this test can be used) for the duration of the COVID-19 declaration under Section 564(b)(1) of the Act, 21  U.S.C. section 360bbb-3(b)(1), unless the authorization is terminated or revoked.     Resp Syncytial Virus by PCR NEGATIVE NEGATIVE Final    Comment: (NOTE) Fact Sheet for Patients: BloggerCourse.com  Fact Sheet for Healthcare Providers: SeriousBroker.it  This test is not yet approved or cleared by the [place] FDA and has been authorized for detection and/or diagnosis of SARS-CoV-2 by FDA under an Emergency Use Authorization (EUA). This EUA will remain in effect (meaning this test can be used) for the duration of the COVID-19 declaration under Section 564(b)(1) of the Act, 21 U.S.C. section 360bbb-3(b)(1), unless the authorization is terminated or revoked.  Performed at [facility], [address] [place], [place] [address]   Blood culture (routine x 2)     Status: Abnormal   Collection Time: 09/19/23  4:05 PM   Specimen: BLOOD  Result Value Ref Range Status   Specimen Description   Final    BLOOD RIGHT ANTECUBITAL Performed at [facility], [address] [place], [place] [address]    Special Requests   Final    BOTTLES DRAWN AEROBIC AND ANAEROBIC Blood Culture results may not be optimal due to an inadequate volume of blood received in culture bottles Performed at [facility], [address], [place], [place] [address]    Culture  Setup Time   Final    GRAM POSITIVE COCCI IN BOTH AEROBIC AND ANAEROBIC BOTTLES Gram Stain Report Called to,Read Back By and Verified With: [facility] WHITE AT 0919 09/20/2023 BY A. SNYDER CRITICAL RESULT CALLED TO, READ BACK BY AND VERIFIED WITH: PHARMD LORI POOLE ON 09/20/23 @ 1332 BY DRT    Culture (A)  Final    STAPHYLOCOCCUS LUGDUNENSIS Sent to [facility] for further susceptibility testing. SEE SEPARATE REPORT Performed at [facility], [address] [place], [place] [address]    Report Status 10/02/2023 FINAL  Final   Organism ID, Bacteria STAPHYLOCOCCUS LUGDUNENSIS  Final       Susceptibility   Staphylococcus lugdunensis - MIC*    CIPROFLOXACIN <=0.5 SENSITIVE Sensitive     ERYTHROMYCIN >=8 RESISTANT Resistant     GENTAMICIN <=0.5 SENSITIVE Sensitive     OXACILLIN >=4 RESISTANT Resistant     TETRACYCLINE <=1 SENSITIVE Sensitive     VANCOMYCIN 1 SENSITIVE Sensitive     TRIMETH/SULFA <=10 SENSITIVE Sensitive     CLINDAMYCIN INTERMEDIATE Intermediate     RIFAMPIN <=0.5 SENSITIVE Sensitive     Inducible Clindamycin NEGATIVE Sensitive     * STAPHYLOCOCCUS LUGDUNENSIS  Blood Culture ID Panel (Reflexed)     Status: Abnormal   Collection Time: 09/19/23  4:05 PM  Result Value Ref Range Status   Enterococcus faecalis NOT DETECTED NOT DETECTED Final   Enterococcus Faecium NOT DETECTED NOT DETECTED Final   Listeria monocytogenes NOT DETECTED NOT DETECTED Final   Staphylococcus species DETECTED (A) NOT DETECTED Final    Comment: CRITICAL RESULT CALLED TO, READ BACK BY AND VERIFIED WITH: PHARMD LORI POOLE ON 09/20/23 @ 1332 BY DRT    Staphylococcus aureus (BCID) NOT DETECTED NOT DETECTED Final   Staphylococcus epidermidis NOT DETECTED NOT DETECTED Final   Staphylococcus lugdunensis DETECTED (A) NOT DETECTED Final    Comment: Methicillin (oxacillin) resistant coagulase negative staphylococcus. Possible blood culture contaminant (unless isolated from more than one blood culture draw or clinical case suggests pathogenicity). No antibiotic treatment is  indicated for blood  culture contaminants. CRITICAL RESULT CALLED TO, READ BACK BY AND VERIFIED WITH: PHARMD LORI POOLE ON 09/20/23 @ 1332 BY DRT    Streptococcus species NOT DETECTED NOT DETECTED Final   Streptococcus agalactiae NOT DETECTED NOT DETECTED Final   Streptococcus pneumoniae NOT DETECTED NOT DETECTED Final   Streptococcus pyogenes NOT DETECTED NOT DETECTED Final   A.calcoaceticus-baumannii NOT DETECTED NOT DETECTED Final   Bacteroides fragilis NOT DETECTED NOT DETECTED Final   Enterobacterales NOT DETECTED NOT  DETECTED Final   Enterobacter cloacae complex NOT DETECTED NOT DETECTED Final   Escherichia coli NOT DETECTED NOT DETECTED Final   Klebsiella aerogenes NOT DETECTED NOT DETECTED Final   Klebsiella oxytoca NOT DETECTED NOT DETECTED Final   Klebsiella pneumoniae NOT DETECTED NOT DETECTED Final   Proteus species NOT DETECTED NOT DETECTED Final   Salmonella species NOT DETECTED NOT DETECTED Final   Serratia marcescens NOT DETECTED NOT DETECTED Final   Haemophilus influenzae NOT DETECTED NOT DETECTED Final   Neisseria meningitidis NOT DETECTED NOT DETECTED Final   Pseudomonas aeruginosa NOT DETECTED NOT DETECTED Final   Stenotrophomonas maltophilia NOT DETECTED NOT DETECTED Final   Candida albicans NOT DETECTED NOT DETECTED Final   Candida auris NOT DETECTED NOT DETECTED Final   Candida glabrata NOT DETECTED NOT DETECTED Final   Candida krusei NOT DETECTED NOT DETECTED Final   Candida parapsilosis NOT DETECTED NOT DETECTED Final   Candida tropicalis NOT DETECTED NOT DETECTED Final   Cryptococcus neoformans/gattii NOT DETECTED NOT DETECTED Final   Methicillin resistance mecA/C DETECTED (A) NOT DETECTED Final    Comment: CRITICAL RESULT CALLED TO, READ BACK BY AND VERIFIED WITH: PHARMD LORI POOLE ON 09/20/23 @ 1332 BY DRT Performed at [facility], [address] [place], [place] [address]   MIC (1 Drug)-     Status: None   Collection Time: 09/19/23  4:05 PM  Result Value Ref Range Status   Min Inhibitory Conc (1 Drug) Final report  Corrected    Comment: (NOTE) Performed At: [facility] [address] [place], [place] [id number] Jolene Schimke MD IO:[id number] CORRECTED ON 02/21 AT 1635: PREVIOUSLY REPORTED AS Preliminary report    Source [id number] DAPTOMYCIN ON BLOOD CULTURE  Final    Comment: Performed at [facility], [address], [place], [place] [address]  Blood culture (routine x 2)     Status: Abnormal   Collection Time: 09/19/23  4:40 PM   Specimen: BLOOD   Result Value Ref Range Status   Specimen Description   Final    BLOOD LEFT ANTECUBITAL Performed at [facility], [address], [place], [place] [address]    Special Requests   Final    BOTTLES DRAWN AEROBIC AND ANAEROBIC Blood Culture adequate volume Performed at [facility], [address] [place], [place] [address]    Culture  Setup Time   Final    GRAM POSITIVE COCCI IN BOTH AEROBIC AND ANAEROBIC BOTTLES Gram Stain Report Called to,Read Back By and Verified With: MEGAN WHITE, RN AT [id number] 09/20/2023 BY Kandice Moos Performed at [facility], [address] [place], [place] [address]    Culture (A)  Final    STAPHYLOCOCCUS LUGDUNENSIS SUSCEPTIBILITIES PERFORMED ON PREVIOUS CULTURE WITHIN THE LAST 5 DAYS. Performed at [facility], [address], [place], [place] [address]    Report Status 09/22/2023 FINAL  Final  Respiratory (~20 pathogens) panel by PCR     Status: None   Collection Time: 09/20/23  6:53 AM   Specimen: Nasopharyngeal Swab; Respiratory  Result Value Ref Range Status   Adenovirus NOT DETECTED NOT DETECTED Final   Coronavirus 229E NOT DETECTED NOT DETECTED Final    Comment: (NOTE) The Coronavirus on the Respiratory Panel, DOES NOT test for the novel  Coronavirus (2019 nCoV)    Coronavirus HKU1 NOT DETECTED NOT DETECTED Final   Coronavirus NL63 NOT DETECTED NOT DETECTED Final   Coronavirus OC43 NOT DETECTED NOT DETECTED Final   Metapneumovirus NOT DETECTED NOT DETECTED Final   Rhinovirus / Enterovirus NOT DETECTED NOT DETECTED Final   Influenza A NOT DETECTED NOT DETECTED Final   Influenza B NOT DETECTED NOT DETECTED Final   Parainfluenza Virus 1 NOT DETECTED NOT DETECTED Final   Parainfluenza Virus 2 NOT DETECTED NOT DETECTED Final   Parainfluenza Virus 3 NOT DETECTED NOT DETECTED Final   Parainfluenza Virus 4 NOT DETECTED NOT DETECTED Final   Respiratory Syncytial Virus NOT DETECTED NOT DETECTED Final   Bordetella pertussis NOT DETECTED NOT DETECTED  Final   Bordetella Parapertussis NOT DETECTED NOT DETECTED Final   Chlamydophila pneumoniae NOT DETECTED NOT DETECTED Final   Mycoplasma pneumoniae NOT DETECTED NOT DETECTED Final    Comment: Performed at [facility], [address], [place], [place] [address]  Culture, blood (Routine X 2) w Reflex to ID Panel     Status: None   Collection Time: 09/20/23  3:45 PM   Specimen: Left Antecubital; Blood  Result Value Ref Range Status   Specimen Description   Final    LEFT ANTECUBITAL BOTTLES DRAWN AEROBIC AND ANAEROBIC   Special Requests Blood Culture adequate volume  Final   Culture   Final    NO GROWTH 5 DAYS Performed at [facility], [address], [place], [place] [address]    Report Status 09/25/2023 FINAL  Final  Culture, blood (Routine X 2) w Reflex to ID Panel     Status: None   Collection Time: 09/20/23  3:46 PM   Specimen: Right Antecubital; Blood  Result Value Ref Range Status   Specimen Description   Final    RIGHT ANTECUBITAL BOTTLES DRAWN AEROBIC AND ANAEROBIC   Special Requests Blood Culture adequate volume  Final   Culture   Final    NO GROWTH 5 DAYS Performed at [facility], [address] [place], [place] [address]    Report Status 09/25/2023 FINAL  Final  Gram stain     Status: None   Collection Time: 09/21/23 10:20 AM   Specimen: Pleura  Result Value Ref Range Status   Specimen Description PLEURAL  Final   Special Requests NONE  Final   Gram Stain   Final    WBC PRESENT, PREDOMINANTLY MONONUCLEAR NO ORGANISMS SEEN CYTOSPIN SMEAR Performed at [facility], [address] [place], [place] [address]    Report Status 09/21/2023 FINAL  Final  Culture, body fluid w Gram Stain-bottle     Status: None   Collection Time: 09/21/23 10:20 AM   Specimen: Pleura  Result Value Ref Range Status   Specimen Description PLEURAL  Final   Special Requests BOTTLES DRAWN AEROBIC AND ANAEROBIC 10 CC  Final   Culture   Final    NO GROWTH 5 DAYS Performed at [facility], [address] [place], [place] [address]    Report Status 09/26/2023 FINAL  Final  Cath Jose Culture     Status: Abnormal   Collection Time: 09/23/23  3:55 PM   Specimen: Catheter Jose  Result Value Ref Range Status   Specimen Description   Final    CATH Jose Performed at [facility], [address], [place], [place] [address]    Special Requests   Final    NONE Performed at [facility], [address] [place], [place] [address]    Culture 1,000 COLONIES/mL STAPHYLOCOCCUS EPIDERMIDIS (A)  Final   Report Status 09/27/2023 FINAL  Final   Organism ID, Bacteria STAPHYLOCOCCUS EPIDERMIDIS (A)  Final      Susceptibility   Staphylococcus epidermidis - MIC*    CIPROFLOXACIN <=0.5 SENSITIVE Sensitive     ERYTHROMYCIN <=0.25 SENSITIVE Sensitive     GENTAMICIN <=0.5 SENSITIVE Sensitive     OXACILLIN >=4 RESISTANT Resistant     TETRACYCLINE <=1 SENSITIVE Sensitive     VANCOMYCIN 1 SENSITIVE Sensitive     TRIMETH/SULFA <=10 SENSITIVE Sensitive     CLINDAMYCIN <=0.25 SENSITIVE Sensitive     RIFAMPIN <=0.5 SENSITIVE Sensitive     Inducible Clindamycin NEGATIVE Sensitive     * 1,000 COLONIES/mL STAPHYLOCOCCUS EPIDERMIDIS  Culture, blood (Routine X 2) w Reflex to ID Panel     Status: None   Collection Time: 09/23/23 10:08 PM   Specimen: Right Antecubital; Blood  Result Value Ref Range Status   Specimen Description RIGHT ANTECUBITAL  Final   Special Requests   Final    BOTTLES DRAWN AEROBIC AND ANAEROBIC Blood Culture adequate volume   Culture   Final    NO GROWTH 5 DAYS Performed at [facility], [address] [place], [place] [address]    Report Status 09/28/2023 FINAL  Final  Culture, blood (Routine X 2) w Reflex to ID Panel     Status: None   Collection Time: 09/23/23 10:09 PM   Specimen: Left Antecubital  Result Value Ref Range Status   Specimen Description LEFT ANTECUBITAL  Final   Special Requests   Final    BOTTLES DRAWN AEROBIC AND ANAEROBIC Blood Culture adequate volume    Culture   Final    NO GROWTH 5 DAYS Performed at [facility], [address], [place], [place] [address]    Report Status 09/28/2023 FINAL  Final   Imaging ECHO TEE Result Date: 09/26/2023    TRANSESOPHOGEAL ECHO REPORT   Patient Name:   Jose Lawrence Date of Exam: 09/23/2023 Medical Rec #:  [id number]         Height:       72.0 in Accession #:    [id number]        Weight:       196.2 lb Date of Birth:  01-01-1941         BSA:          2.113 m Patient Age:    82 years          BP:           141/58 mmHg Patient Gender: M                 HR:           72 bpm. [facility] Procedure: Transesophageal Echo, Cardiac Doppler and Color Doppler (Both            Spectral and Color Flow Doppler were utilized during procedure). Indications:     Bacteremia  History:         Patient has prior history of Echocardiogram examinations, most  recent 09/21/2023. Abnormal ECG, Arrythmias:Atrial Fibrillation,                  Signs/Symptoms:Fever and Bacteremia; Risk Factors:Diabetes and                  Hypertension. Cancer.  Sonographer:     Sheralyn Boatman RDCS Referring Phys:  [id number] [facility] Lennox Diagnosing Phys: Winfield Rast Mallipeddi PROCEDURE: After discussion of the risks and benefits of a TEE, an informed consent was obtained from the patient. The transesophogeal probe was passed without difficulty through the esophogus of the patient. Imaged were obtained with the patient in a left lateral decubitus position. Local oropharyngeal anesthetic was provided with Cetacaine. Sedation performed by different physician. The patient was monitored while under deep sedation. Anesthestetic sedation was provided intravenously by Anesthesiology: 372mg  of Propofol, 100mg  of Lidocaine. Image quality was good. The patient's vital signs; including heart rate, blood pressure, and oxygen saturation; remained stable throughout the procedure. The patient developed no complications during  the procedure.   IMPRESSIONS  1. Left ventricular ejection fraction, by estimation, is 55 to 60%. The left ventricle has normal function. Left ventricular diastolic function could not be evaluated.  2. Right ventricular systolic function is normal. The right ventricular size is normal.  3. No left atrial/left atrial appendage thrombus was detected. The LAA emptying velocity was 26 cm/s.  4. Moderate pleural effusion in both left and right lateral regions.  5. The mitral valve is normal in structure. Trivial mitral valve regurgitation. No evidence of mitral stenosis.  6. The aortic valve is tricuspid. Aortic valve regurgitation is not visualized. No aortic stenosis is present. Conclusion(s)/Recommendation(s): No evidence of vegetation/infective endocarditis on this transesophageael echocardiogram. FINDINGS  Left Ventricle: Left ventricular ejection fraction, by estimation, is 55 to 60%. The left ventricle has normal function. Strain imaging was not performed. The left ventricular internal cavity size was normal in size. There is no left ventricular hypertrophy. Left ventricular diastolic function could not be evaluated. Right Ventricle: The right ventricular size is normal. No increase in right ventricular wall thickness. Right ventricular systolic function is normal. Left Atrium: Left atrial size was not assessed. No left atrial/left atrial appendage thrombus was detected. The LAA emptying velocity was 26 cm/s. Right Atrium: Right atrial size was not assessed. Pericardium: Trivial pericardial effusion is present. The pericardial effusion is anterior to the right ventricle. Mitral Valve: The mitral valve is normal in structure. Trivial mitral valve regurgitation. No evidence of mitral valve stenosis. Tricuspid Valve: The tricuspid valve is normal in structure. Tricuspid valve regurgitation is trivial. No evidence of tricuspid stenosis. Aortic Valve: The aortic valve is tricuspid. Aortic valve regurgitation is not visualized. No  aortic stenosis is present. Aortic valve mean gradient measures 5.0 mmHg. Aortic valve peak gradient measures 8.1 mmHg. Pulmonic Valve: The pulmonic valve was not well visualized. Pulmonic valve regurgitation is not visualized. Aorta: The aortic root and ascending aorta are structurally normal, with no evidence of dilitation. IAS/Shunts: No atrial level shunt detected by color flow Doppler. Additional Comments: 3D imaging was not performed. There is a moderate pleural effusion in both left and right lateral regions. Mild ascites is present. Spectral Doppler performed. AORTIC VALVE AV Vmax:           142.00 cm/s AV Vmean:          101.000 cm/s AV VTI:            0.299 m AV Peak Grad:  8.1 mmHg AV Mean Grad:      5.0 mmHg LVOT Vmax:         88.60 cm/s LVOT Vmean:        53.100 cm/s LVOT VTI:          0.154 m LVOT/AV VTI ratio: 0.52  SHUNTS Systemic VTI: 0.15 m Vishnu Priya Mallipeddi Electronically signed by Winfield Rast Mallipeddi Signature Date/Time: 09/26/2023/12:44:51 PM    Final    DG CHEST PORT 1 VIEW Result Date: 09/24/2023 CLINICAL DATA:  PICC line placement. EXAM: PORTABLE CHEST 1 VIEW COMPARISON:  09/21/2023 FINDINGS: Right arm PICC line is identified with Jose at the superior cavoatrial junction the heart size appears within normal limits. Lung volumes are low. Blunting of the costophrenic angles. Mild diffuse increase interstitial markings noted which may reflect pulmonary vascular congestion or early pulmonary edema. Atelectasis noted in the right base. IMPRESSION: 1. Right arm PICC line Jose is at the superior cavoatrial junction. 2. Low lung volumes. 3. Mild diffuse increase interstitial markings may reflect pulmonary vascular congestion or early pulmonary edema. 4. Blunting of the costophrenic angles suggest small residual pleural effusions. Electronically Signed   By: Signa Kell M.D.   On: 09/24/2023 12:13   [place] EKG SITE RITE Result Date: 09/23/2023 If Site Rite image not attached, placement  could not be confirmed due to current cardiac rhythm.  ECHOCARDIOGRAM COMPLETE Result Date: 09/21/2023    ECHOCARDIOGRAM REPORT   Patient Name:   Jose Lawrence Date of Exam: 09/21/2023 Medical Rec #:  [id number]         Height:       72.0 in Accession #:    [id number]        Weight:       196.2 lb Date of Birth:  August 13, 1940         BSA:          2.113 m Patient Age:    82 years          BP:           120/59 mmHg Patient Gender: M                 HR:           41 bpm. [facility] Procedure: 2D Echo, Color Doppler and Cardiac Doppler (Both Spectral and Color            Flow Doppler were utilized during procedure). Indications:    Endocarditis [[id number]]  History:        Patient has no prior history of Echocardiogram examinations and                 Patient has prior history of Echocardiogram examinations, most                 recent 05/30/2023. Arrythmias:Atrial Fibrillation; Risk                 Factors:Hypertension and Diabetes.  Sonographer:    Webb Laws Referring Phys: [id number] [facility] SINGH IMPRESSIONS  1. Left ventricular ejection fraction, by estimation, is 55 to 60%. The left ventricle has normal function. The left ventricle has no regional wall motion abnormalities. Left ventricular diastolic parameters are indeterminate.  2. Right ventricular systolic function is low normal. The right ventricular size is mildly enlarged. There is moderately elevated pulmonary artery systolic pressure. The estimated right ventricular systolic pressure is 46.1 mmHg.  3. Left atrial size was upper normal.  4.  Right atrial size was mildly dilated.  5. Ascitic fluid and left pleural effusion noted.  6. The mitral valve is grossly normal. Mild mitral valve regurgitation.  7. The aortic valve is tricuspid. There is mild calcification of the aortic valve. Aortic valve regurgitation is not visualized. Aortic valve sclerosis is present, with no evidence of aortic valve stenosis.  8. The inferior vena cava is  dilated in size with <50% respiratory variability, suggesting right atrial pressure of 15 mmHg. Comparison(s): Prior images reviewed side by side. No obvious valvular vegetations. FINDINGS  Left Ventricle: Left ventricular ejection fraction, by estimation, is 55 to 60%. The left ventricle has normal function. The left ventricle has no regional wall motion abnormalities. Strain imaging was not performed. The left ventricular internal cavity  size was normal in size. There is no left ventricular hypertrophy. Left ventricular diastolic function could not be evaluated due to atrial fibrillation. Left ventricular diastolic parameters are indeterminate. Right Ventricle: The right ventricular size is mildly enlarged. No increase in right ventricular wall thickness. Right ventricular systolic function is low normal. There is moderately elevated pulmonary artery systolic pressure. The tricuspid regurgitant  velocity is 2.79 m/s, and with an assumed right atrial pressure of 15 mmHg, the estimated right ventricular systolic pressure is 46.1 mmHg. Left Atrium: Left atrial size was upper normal. Right Atrium: Right atrial size was mildly dilated. Pericardium: Ascitic fluid and left pleural effusion noted. There is no evidence of pericardial effusion. Mitral Valve: The mitral valve is grossly normal. Mild mitral valve regurgitation. Tricuspid Valve: The tricuspid valve is grossly normal. Tricuspid valve regurgitation is trivial. Aortic Valve: The aortic valve is tricuspid. There is mild calcification of the aortic valve. There is mild aortic valve annular calcification. Aortic valve regurgitation is not visualized. Aortic valve sclerosis is present, with no evidence of aortic valve stenosis. Pulmonic Valve: The pulmonic valve was grossly normal. Pulmonic valve regurgitation is trivial. Aorta: The aortic root is normal in size and structure. Venous: The inferior vena cava is dilated in size with less than 50% respiratory  variability, suggesting right atrial pressure of 15 mmHg. IAS/Shunts: No atrial level shunt detected by color flow Doppler. Additional Comments: 3D imaging was not performed.  LEFT VENTRICLE PLAX 2D LVIDd:         4.60 cm   Diastology LVIDs:         2.90 cm   LV e' medial:    7.07 cm/s LV PW:         1.00 cm   LV E/e' medial:  14.4 LV IVS:        0.90 cm   LV e' lateral:   10.60 cm/s LVOT diam:     1.80 cm   LV E/e' lateral: 9.6 LV SV:         55 LV SV Index:   26 LVOT Area:     2.54 cm  RIGHT VENTRICLE            IVC RV Basal diam:  4.70 cm    IVC diam: 2.70 cm RV Mid diam:    3.80 cm RV S prime:     8.81 cm/s TAPSE (M-mode): 2.2 cm LEFT ATRIUM             Index        RIGHT ATRIUM           Index LA diam:        4.30 cm 2.03 cm/m   RA Area:  24.30 cm LA Vol (A2C):   52.6 ml 24.89 ml/m  RA Volume:   75.30 ml  35.63 ml/m LA Vol (A4C):   66.3 ml 31.37 ml/m LA Biplane Vol: 60.3 ml 28.53 ml/m  AORTIC VALVE LVOT Vmax:   83.90 cm/s LVOT Vmean:  60.500 cm/s LVOT VTI:    0.215 m  AORTA Ao Root diam: 3.30 cm MITRAL VALVE                TRICUSPID VALVE MV Area (PHT): 2.77 cm     TR Peak grad:   31.1 mmHg MV Decel Time: 274 msec     TR Vmax:        279.00 cm/s MV E velocity: 102.00 cm/s                             SHUNTS                             Systemic VTI:  0.22 m                             Systemic Diam: 1.80 cm Nona Dell MD Electronically signed by Nona Dell MD Signature Date/Time: 09/21/2023/3:46:39 PM    Final    DG Chest 1 View Result Date: 09/21/2023 CLINICAL DATA:  Right pleural effusion, status post thoracentesis EXAM: CHEST  1 VIEW COMPARISON:  09/19/2023 FINDINGS: Nearly resolved right effusion following thoracentesis. Improved right basilar aeration. Persistent low lung volumes with bibasilar atelectasis/opacities. No pneumothorax. Trachea midline. Left subclavian power port catheter Jose proximal SVC as before. Aorta atherosclerotic. IMPRESSION: Nearly resolved right effusion  following thoracentesis. No pneumothorax. Electronically Signed   By: Judie Petit.  Shick M.D.   On: 09/21/2023 11:07   US THORACENTESIS ASP PLEURAL SPACE W/IMG GUIDE Result Date: 09/21/2023 INDICATION: Colon cancer, shortness of breath, right pleural effusion EXAM: ULTRASOUND GUIDED RIGHT THORACENTESIS MEDICATIONS: 1% lidocaine local COMPLICATIONS: None immediate. PROCEDURE: An ultrasound guided thoracentesis was thoroughly discussed with the patient and questions answered. The benefits, risks, alternatives and complications were also discussed. The patient understands and wishes to proceed with the procedure. Written consent was obtained. Ultrasound was performed to localize and mark an adequate pocket of fluid in the right chest. The area was then prepped and draped in the normal sterile fashion. 1% Lidocaine was used for local anesthesia. Under ultrasound guidance a 6 Fr Safe-T-Centesis catheter was introduced. Thoracentesis was performed. The catheter was removed and a dressing applied. FINDINGS: A total of approximately 2.1 L of clear pleural fluid was removed. Samples were sent to the laboratory as requested by the clinical team. IMPRESSION: Successful ultrasound guided right thoracentesis yielding 2.1 L of pleural fluid. Electronically Signed   By: Judie Petit.  Shick M.D.   On: 09/21/2023 11:04   CT CHEST ABDOMEN PELVIS W CONTRAST Result Date: 09/19/2023 CLINICAL DATA:  Sepsis. c/o cough, congestion, fever and flu-like symptoms since Saturday EXAM: CT CHEST, ABDOMEN, AND PELVIS WITH CONTRAST TECHNIQUE: Multidetector CT imaging of the chest, abdomen and pelvis was performed following the standard protocol during bolus administration of intravenous contrast. RADIATION DOSE REDUCTION: This exam was performed according to the departmental dose-optimization program which includes automated exposure control, adjustment of the mA and/or kV according to patient size and/or use of iterative reconstruction technique. CONTRAST:   OMNIPAQUE IOHEXOL 300 MG/ML  SOLN COMPARISON:  Chest x-ray  09/19/2023, CT abdomen pelvis 04/22/2023, CT abdomen pelvis 04/04/2023, CT abdomen pelvis 10/03/2021 FINDINGS: CT CHEST FINDINGS Cardiovascular: Mildly enlarged right heart size. No significant pericardial effusion. The thoracic aorta is normal in caliber. Mild atherosclerotic plaque of the thoracic aorta. Four-vessel coronary artery calcifications. Aortic valve leaflet calcification. Mediastinum/Nodes: No enlarged mediastinal, hilar, or axillary lymph nodes. Thyroid gland, trachea, and esophagus demonstrate no significant findings. Lungs/Pleura: Bilateral lower lobe passive atelectasis. Almost complete collapse of the right lower lobe. No focal consolidation. No pulmonary nodule. No pulmonary mass. Interval increase in size of a moderate right and small left pleural effusion. No pneumothorax. Musculoskeletal: No chest wall abnormality.  Bilateral gynecomastia. No suspicious lytic or blastic osseous lesions. No acute displaced fracture. Multilevel degenerative changes of the spine. CT ABDOMEN PELVIS FINDINGS Hepatobiliary: Nodular hepatic contour. No focal liver abnormality. Status post cholecystectomy. No biliary dilatation. Pancreas: Diffusely atrophic. No focal lesion. Otherwise normal pancreatic contour. No surrounding inflammatory changes. No main pancreatic ductal dilatation. Spleen: Normal in size without focal abnormality. Adrenals/Urinary Tract: No adrenal nodule bilaterally. Bilateral kidneys enhance symmetrically. Question mild left urothelial thickening. No hydronephrosis. No hydroureter.  No nephroureterolithiasis. The urinary bladder is unremarkable. On delayed imaging, there is no urothelial wall thickening and there are no filling defects in the opacified portions of the bilateral collecting systems or ureters. 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 Rectal surgical changes with Redemonstration of extensive  presacral mesenteric fat stranding and soft tissue density. Similar orientation of several loops of the small bowel within the left anterior abdomen suggestive of possible underlying adhesions. No associated obstruction. Stomach is within normal limits. Majority of the small bowel is decompressed. No evidence of bowel wall thickening or dilatation. No pneumatosis. Appendix appears normal. Vascular/Lymphatic: Recanalized paraumbilical vein. Paraesophageal varices. The portal, splenic, superior mesenteric veins are patent. No abdominal aorta or iliac aneurysm. Mild atherosclerotic plaque of the aorta and its branches. No abdominal, pelvic, or inguinal lymphadenopathy. Reproductive: Prostate is unremarkable. Other: Grossly stable small volume simple free fluid ascites. Associated diffuse mild mesenteric edema. No intraperitoneal free gas. No organized fluid collection. Musculoskeletal: No abdominal wall hernia or abnormality. No suspicious lytic or blastic osseous lesions. No acute displaced fracture. Multilevel degenerative changes of the spine. IMPRESSION: 1. Interval increase in size of a moderate right and small left pleural effusion. Almost complete collapse of the right lower lobe. 2. Mild right cardiomegaly. 3. Question mild left urothelial thickening. Correlate with urinalysis for infection. 4. Cirrhosis with portal hypertension. No focal liver lesions identified. Please note that liver protocol enhanced MR and CT are the most sensitive tests for the screening detection of hepatocellular carcinoma in the high risk setting of cirrhosis. 5. Grossly stable small volume simple free fluid ascites. 6. 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 Rectal surgical changes with Redemonstration of extensive presacral mesenteric fat stranding and soft tissue density. Markedly limited evaluation of this region. 7. Aortic Atherosclerosis (ICD10-I70.0) including four-vessel coronary artery and aortic valve leaflet  calcifications-correlate for aortic stenosis. Electronically Signed   By: Tish Frederickson M.D.   On: 09/19/2023 19:39   DG Chest 2 View Result Date: 09/19/2023 CLINICAL DATA:  Cough EXAM: CHEST - 2 VIEW COMPARISON:  06/08/2023 FINDINGS: Underinflation. Enlarged cardiopericardial silhouette. Central vascular prominence. Developing bilateral small pleural effusion with adjacent opacity, right-greater-than-left. Recommend follow-up. No pneumothorax. Left upper chest subclavian port. Jose overlying the central SVC. Degenerative changes of the spine. IMPRESSION: Developing pleural effusions with adjacent opacity, right-greater-than-left. Recommend follow-up. Persistent enlarged cardiac silhouette with  vascular congestion. Chest port Electronically Signed   By: Karen Kays M.D.   On: 09/19/2023 17:39   Assessment/plan 83 Yo Male with multiple comorbidities as above with  # Staph lugdunensis bacteremia - s/p port removal and abtx ( daptomycin, sensitive) as above - Fu as needed   # PICC  - removed   # Medication Monitoring  2/25 CBC and CMP reviewed, CR 1.41. Fu with PCP  # Liver cirrhosis, portal HTN - Follow Gastroenterology, last seen 2/24 - HBV and HCV serology today  # Transverse colon adenocarcinoma - Follows Oncology, last seen 3/3  I have personally spent 46 minutes involved in face-to-face and non-face-to-face activities for this patient on the day of the visit. Professional time spent includes the following activities: Preparing to see the patient (review of tests), Obtaining and/or reviewing separately obtained history (admission/discharge record), Performing a medically appropriate examination and/or evaluation , Ordering medications/tests/procedures, referring and communicating with other health care professionals, Documenting clinical information in the EMR, Independently interpreting results (not separately reported), Communicating results to the patient/family/caregiver, Counseling  and educating the patient/family/caregiver and Care coordination (not separately reported).   Of note, portions of this note may have been created with voice recognition software. While this note has been edited for accuracy, occasional wrong-word or 'sound-a-like' substitutions may have occurred due to the inherent limitations of voice recognition software.   Victoriano Lain, MD [facility] 10/13/2023, 8:48 AM

## 2023-10-14 ENCOUNTER — Other Ambulatory Visit (INDEPENDENT_AMBULATORY_CARE_PROVIDER_SITE_OTHER): Payer: Self-pay | Admitting: *Deleted

## 2023-10-14 ENCOUNTER — Encounter (INDEPENDENT_AMBULATORY_CARE_PROVIDER_SITE_OTHER): Payer: Self-pay | Admitting: *Deleted

## 2023-10-14 DIAGNOSIS — K7460 Unspecified cirrhosis of liver: Secondary | ICD-10-CM | POA: Diagnosis not present

## 2023-10-14 DIAGNOSIS — E43 Unspecified severe protein-calorie malnutrition: Secondary | ICD-10-CM | POA: Diagnosis not present

## 2023-10-14 DIAGNOSIS — Z452 Encounter for adjustment and management of vascular access device: Secondary | ICD-10-CM | POA: Diagnosis not present

## 2023-10-14 DIAGNOSIS — I129 Hypertensive chronic kidney disease with stage 1 through stage 4 chronic kidney disease, or unspecified chronic kidney disease: Secondary | ICD-10-CM | POA: Diagnosis not present

## 2023-10-14 DIAGNOSIS — I4821 Permanent atrial fibrillation: Secondary | ICD-10-CM | POA: Diagnosis not present

## 2023-10-14 DIAGNOSIS — B955 Unspecified streptococcus as the cause of diseases classified elsewhere: Secondary | ICD-10-CM | POA: Diagnosis not present

## 2023-10-14 LAB — HEPATITIS B SURFACE ANTIGEN: Hepatitis B Surface Ag: NONREACTIVE

## 2023-10-14 LAB — HEPATITIS C ANTIBODY: Hepatitis C Ab: NONREACTIVE

## 2023-10-14 LAB — HEPATITIS B CORE ANTIBODY, TOTAL: Hep B Core Total Ab: NONREACTIVE

## 2023-10-14 LAB — HEPATITIS B SURFACE ANTIBODY,QUALITATIVE: Hep B S Ab: NONREACTIVE

## 2023-10-17 ENCOUNTER — Telehealth (INDEPENDENT_AMBULATORY_CARE_PROVIDER_SITE_OTHER): Payer: Self-pay | Admitting: *Deleted

## 2023-10-17 ENCOUNTER — Observation Stay (HOSPITAL_COMMUNITY)
Admission: EM | Admit: 2023-10-17 | Discharge: 2023-10-18 | Disposition: A | Discharge status: Home / Self Care | Attending: Internal Medicine | Admitting: Internal Medicine

## 2023-10-17 ENCOUNTER — Other Ambulatory Visit: Payer: Self-pay

## 2023-10-17 ENCOUNTER — Emergency Department (HOSPITAL_COMMUNITY)

## 2023-10-17 ENCOUNTER — Encounter (HOSPITAL_COMMUNITY): Payer: Self-pay

## 2023-10-17 DIAGNOSIS — J90 Pleural effusion, not elsewhere classified: Secondary | ICD-10-CM | POA: Diagnosis present

## 2023-10-17 DIAGNOSIS — R17 Unspecified jaundice: Secondary | ICD-10-CM | POA: Diagnosis not present

## 2023-10-17 DIAGNOSIS — D696 Thrombocytopenia, unspecified: Secondary | ICD-10-CM | POA: Insufficient documentation

## 2023-10-17 DIAGNOSIS — E1165 Type 2 diabetes mellitus with hyperglycemia: Secondary | ICD-10-CM | POA: Insufficient documentation

## 2023-10-17 DIAGNOSIS — R7401 Elevation of levels of liver transaminase levels: Secondary | ICD-10-CM | POA: Diagnosis not present

## 2023-10-17 DIAGNOSIS — Z85828 Personal history of other malignant neoplasm of skin: Secondary | ICD-10-CM | POA: Insufficient documentation

## 2023-10-17 DIAGNOSIS — R0602 Shortness of breath: Secondary | ICD-10-CM | POA: Insufficient documentation

## 2023-10-17 DIAGNOSIS — Z932 Ileostomy status: Secondary | ICD-10-CM | POA: Insufficient documentation

## 2023-10-17 DIAGNOSIS — Z8673 Personal history of transient ischemic attack (TIA), and cerebral infarction without residual deficits: Secondary | ICD-10-CM | POA: Diagnosis not present

## 2023-10-17 DIAGNOSIS — E46 Unspecified protein-calorie malnutrition: Secondary | ICD-10-CM | POA: Diagnosis not present

## 2023-10-17 DIAGNOSIS — I4891 Unspecified atrial fibrillation: Secondary | ICD-10-CM | POA: Insufficient documentation

## 2023-10-17 DIAGNOSIS — Z85038 Personal history of other malignant neoplasm of large intestine: Secondary | ICD-10-CM | POA: Insufficient documentation

## 2023-10-17 DIAGNOSIS — E039 Hypothyroidism, unspecified: Secondary | ICD-10-CM | POA: Diagnosis not present

## 2023-10-17 DIAGNOSIS — E8809 Other disorders of plasma-protein metabolism, not elsewhere classified: Secondary | ICD-10-CM | POA: Diagnosis present

## 2023-10-17 DIAGNOSIS — Z9049 Acquired absence of other specified parts of digestive tract: Secondary | ICD-10-CM | POA: Insufficient documentation

## 2023-10-17 DIAGNOSIS — K766 Portal hypertension: Secondary | ICD-10-CM | POA: Diagnosis not present

## 2023-10-17 DIAGNOSIS — E43 Unspecified severe protein-calorie malnutrition: Secondary | ICD-10-CM | POA: Diagnosis not present

## 2023-10-17 DIAGNOSIS — K7460 Unspecified cirrhosis of liver: Secondary | ICD-10-CM | POA: Diagnosis not present

## 2023-10-17 DIAGNOSIS — Z79899 Other long term (current) drug therapy: Secondary | ICD-10-CM | POA: Insufficient documentation

## 2023-10-17 DIAGNOSIS — Z87891 Personal history of nicotine dependence: Secondary | ICD-10-CM | POA: Insufficient documentation

## 2023-10-17 DIAGNOSIS — D539 Nutritional anemia, unspecified: Secondary | ICD-10-CM | POA: Diagnosis not present

## 2023-10-17 DIAGNOSIS — Z86711 Personal history of pulmonary embolism: Secondary | ICD-10-CM | POA: Diagnosis not present

## 2023-10-17 DIAGNOSIS — D693 Immune thrombocytopenic purpura: Secondary | ICD-10-CM | POA: Insufficient documentation

## 2023-10-17 HISTORY — DX: Unspecified cirrhosis of liver: K74.60

## 2023-10-17 LAB — CBC WITH DIFFERENTIAL/PLATELET
Abs Immature Granulocytes: 0.05 10*3/uL (ref 0.00–0.07)
Basophils Absolute: 0.1 10*3/uL (ref 0.0–0.1)
Basophils Relative: 1 %
Eosinophils Absolute: 0.1 10*3/uL (ref 0.0–0.5)
Eosinophils Relative: 1 %
HCT: 28.4 % — ABNORMAL LOW (ref 39.0–52.0)
Hemoglobin: 9.9 g/dL — ABNORMAL LOW (ref 13.0–17.0)
Immature Granulocytes: 1 %
Lymphocytes Relative: 8 %
Lymphs Abs: 0.5 10*3/uL — ABNORMAL LOW (ref 0.7–4.0)
MCH: 37.6 pg — ABNORMAL HIGH (ref 26.0–34.0)
MCHC: 34.9 g/dL (ref 30.0–36.0)
MCV: 108 fL — ABNORMAL HIGH (ref 80.0–100.0)
Monocytes Absolute: 0.6 10*3/uL (ref 0.1–1.0)
Monocytes Relative: 10 %
Neutro Abs: 5 10*3/uL (ref 1.7–7.7)
Neutrophils Relative %: 79 %
Platelets: 99 10*3/uL — ABNORMAL LOW (ref 150–400)
RBC: 2.63 MIL/uL — ABNORMAL LOW (ref 4.22–5.81)
RDW: 16.6 % — ABNORMAL HIGH (ref 11.5–15.5)
WBC: 6.4 10*3/uL (ref 4.0–10.5)
nRBC: 0 % (ref 0.0–0.2)

## 2023-10-17 LAB — URINALYSIS, ROUTINE W REFLEX MICROSCOPIC
Glucose, UA: 50 mg/dL — AB
Hgb urine dipstick: NEGATIVE
Ketones, ur: NEGATIVE mg/dL
Leukocytes,Ua: NEGATIVE
Nitrite: NEGATIVE
Protein, ur: NEGATIVE mg/dL
Specific Gravity, Urine: 1.015 (ref 1.005–1.030)
pH: 5 (ref 5.0–8.0)

## 2023-10-17 LAB — COMPREHENSIVE METABOLIC PANEL
ALT: 71 U/L — ABNORMAL HIGH (ref 0–44)
AST: 109 U/L — ABNORMAL HIGH (ref 15–41)
Albumin: 1.9 g/dL — ABNORMAL LOW (ref 3.5–5.0)
Alkaline Phosphatase: 121 U/L (ref 38–126)
Anion gap: 7 (ref 5–15)
BUN: 14 mg/dL (ref 8–23)
CO2: 24 mmol/L (ref 22–32)
Calcium: 8.2 mg/dL — ABNORMAL LOW (ref 8.9–10.3)
Chloride: 100 mmol/L (ref 98–111)
Creatinine, Ser: 1.04 mg/dL (ref 0.61–1.24)
GFR, Estimated: >60 mL/min (ref >60)
Glucose, Bld: 238 mg/dL — ABNORMAL HIGH (ref 70–99)
Potassium: 4.1 mmol/L (ref 3.5–5.1)
Sodium: 131 mmol/L — ABNORMAL LOW (ref 135–145)
Total Bilirubin: 11.8 mg/dL — ABNORMAL HIGH (ref 0.0–1.2)
Total Protein: 6.6 g/dL (ref 6.5–8.1)

## 2023-10-17 LAB — PROTIME-INR
INR: 2.6 — ABNORMAL HIGH (ref 0.8–1.2)
Prothrombin Time: 28.3 s — ABNORMAL HIGH (ref 11.4–15.2)

## 2023-10-17 LAB — LIPASE, BLOOD: Lipase: 29 U/L (ref 11–51)

## 2023-10-17 LAB — BRAIN NATRIURETIC PEPTIDE: B Natriuretic Peptide: 163 pg/mL — ABNORMAL HIGH (ref 0.0–100.0)

## 2023-10-17 LAB — AMMONIA: Ammonia: 17 umol/L (ref 9–35)

## 2023-10-17 MED ORDER — INSULIN ASPART 100 UNIT/ML IJ SOLN
0.0000 [IU] | INTRAMUSCULAR | Status: DC
  Administered 2023-10-18: 3 [IU] via SUBCUTANEOUS
  Filled 2023-10-17: qty 1

## 2023-10-17 MED ORDER — ONDANSETRON HCL 4 MG PO TABS: 4.0000 mg | ORAL_TABLET | Freq: Four times a day (QID) | ORAL | Status: DC | PRN

## 2023-10-17 MED ORDER — FUROSEMIDE 10 MG/ML IJ SOLN
40.0000 mg | Freq: Once | INTRAMUSCULAR | Status: AC
  Administered 2023-10-17: 40 mg via INTRAVENOUS
  Filled 2023-10-17: qty 4

## 2023-10-17 MED ORDER — ONDANSETRON HCL 4 MG/2ML IJ SOLN: 4.0000 mg | Freq: Four times a day (QID) | INTRAMUSCULAR | Status: DC | PRN

## 2023-10-17 MED ORDER — IOHEXOL 300 MG/ML  SOLN
100.0000 mL | Freq: Once | INTRAMUSCULAR | Status: AC | PRN
  Administered 2023-10-17: 100 mL via INTRAVENOUS

## 2023-10-17 MED ORDER — GLUCERNA SHAKE PO LIQD
237.0000 mL | Freq: Three times a day (TID) | ORAL | Status: DC
  Filled 2023-10-17 (×5): qty 237

## 2023-10-17 NOTE — H&P (Signed)
 History and Physical    Patient: Jose Lawrence GNF:[id number] DOB: 1941/05/23 DOA: 10/17/2023 DOS: the patient was seen and examined on 10/17/2023 PCP: Ignatius Specking, MD  Patient coming from: Home  Chief Complaint:  Chief Complaint  Patient presents with   Jaundice   HPI: Jose Lawrence is a 83 y.o. male with medical history significant of pulmonary embolism, type 2 diabetes mellitus, BPH, hypothyroidism, colon ca sp resection/colostomy ( follows Dr Ellin Saba) and liver cirrhosis, prior strep agalactiae bacteremia in 05/2023 s/p treatment who presents to the emergency department after being advised to go to the ER by his PCP.  He states that he developed jaundice last week, but denies any chest pain, abdominal pain, shortness of breath, nausea, vomiting or fever.  He was admitted from 2/10 to 2/17 due to staph lugdunensis bacteremia, TTE done showed no vegetations, TEE done on 2/14 also showed no vegetations.  He was treated with daptomycin till 2/28  ED Course:  In the emergency department, patient was bradycardic with HR of 57 bpm, BP was 148/77, other vital signs are within normal range.  Workup in the ED showed macrocytic anemia, thrombocytopenia, BNP 163, ammonia 17, PTT 28.3, INR 2.6, urinalysis was normal. Ultrasound abdomen of right upper quadrant showed cirrhotic liver without a discrete liver lesion, ascites, right pleural effusion. CT abdomen and pelvis with contrast showed no acute abnormality in the abdominal pelvis.  Persistent bilateral pleural effusions, right side larger than left. Chest x-ray showed mild diffuse interstitial prominence, bilateral small pleural effusions. IV Lasix 40 mg x 1 was given Gastroenterologist (Dr. Marletta Lor) was consulted and recommended MRCP with plan to follow-up on patient in the morning.  Review of Systems: Review of systems as noted in the HPI. All other systems reviewed and are negative.   Past Medical History:  Diagnosis Date    Anemia    Causing interruption in anticoagulation   Atrial fibrillation and flutter (HCC)    BPH (benign prostatic hypertrophy)    Cirrhosis (HCC)    Colon cancer (HCC)    Status post LAR 2008   Dysrhythmia    Hypothyroidism    Lacunar infarction (HCC) 11/2012   RT 3rd NERVE PALSY, SB Dr. Lita Mains   PE (pulmonary embolism)    Temporarily on Eliquis 2017   Skin cancer    35 years ago   Type 2 diabetes mellitus (HCC)    Vitamin B 12 deficiency 07/10/2019   Past Surgical History:  Procedure Laterality Date   ABDOMINOPERINEAL PROCTOCOLECTOMY  2009   end colostomy    BIOPSY  08/25/2018   Procedure: BIOPSY;  Surgeon: Malissa Hippo, MD;  [facility];  Service: Endoscopy;;   BIOPSY  10/04/2021   Procedure: BIOPSY;  Surgeon: Malissa Hippo, MD;  [facility];  Service: Endoscopy;;  small, bowel   BLADDER REPAIR  08/29/2018   Procedure: BLADDER REPAIR;  Surgeon: Lucretia Roers, MD;  Location: [facility];  Service: General;;   COLECTOMY  2008   COLONOSCOPY WITH PROPOFOL N/A 08/25/2018   Procedure: COLONOSCOPY WITH PROPOFOL;  Surgeon: Malissa Hippo, MD;  [facility];  Service: Endoscopy;  Laterality: N/A;  2:20   COLOSTOMY     ESOPHAGOGASTRODUODENOSCOPY (EGD) WITH PROPOFOL N/A 05/10/2023   Procedure: ESOPHAGOGASTRODUODENOSCOPY (EGD) WITH PROPOFOL;  Surgeon: Dolores Frame, MD;  [facility]: [facility];  Service: Gastroenterology;  Laterality: N/A;  11:00AM;ASA 3   EXPLORATORY LAPAROTOMY  2017   ? pneumoperitoneum of unknown  etiology/ negative Ex lap   GIVENS CAPSULE STUDY  10/04/2021   Procedure: GIVENS CAPSULE STUDY;  Surgeon: Malissa Hippo, MD;  [facility];  Service: Endoscopy;;   ILEOSCOPY N/A 10/04/2021   Procedure: ILEOSCOPY THROUGH STOMA;  Surgeon: Malissa Hippo, MD;  [facility];  Service: Endoscopy;  Laterality: N/A;   ILEOSTOMY  08/29/2018   Procedure: ILEOSTOMY;  Surgeon: Lucretia Roers, MD;   Location: [facility];  Service: General;;   LAPAROSCOPIC CHOLECYSTECTOMY  2007   LYSIS OF ADHESION  08/29/2018   Procedure: LYSIS OF ADHESION;  Surgeon: Lucretia Roers, MD;  Location: [facility];  Service: General;;   PARTIAL COLECTOMY N/A 08/29/2018   Procedure: PARTIAL COLECTOMY;  Surgeon: Lucretia Roers, MD;  Location: [facility];  Service: General;  Laterality: N/A;   PORT-A-CATH REMOVAL Left 09/23/2023   Procedure: MINOR REMOVAL PORT-A-CATH;  Surgeon: Marjo Bicker, MD;  Location: [facility];  Service: Cardiovascular;  Laterality: Left;   PORTACATH PLACEMENT Left 10/04/2018   Procedure: INSERTION PORT-A-CATH (attached catheter left subclavian);  Surgeon: Lucretia Roers, MD;  Location: [facility];  Service: General;  Laterality: Left;   SBO SURGERY  06/2009   TEE WITHOUT CARDIOVERSION N/A 09/23/2023   Procedure: TRANSESOPHAGEAL ECHOCARDIOGRAM (TEE);  Surgeon: Marjo Bicker, MD;  Location: [facility];  Service: Cardiovascular;  Laterality: N/A;    Social History:  reports that he quit smoking about 32 years ago. His smoking use included cigarettes. He started smoking about 59 years ago. He has a 27 pack-year smoking history. He quit smokeless tobacco use about 28 years ago.  His smokeless tobacco use included chew. He reports that he does not drink alcohol and does not use drugs.   No Known Allergies  Family History  Problem Relation Age of Onset   Heart disease Father    Heart failure Father    Heart attack Father    Prostate cancer Paternal Uncle     ***  Prior to Admission medications   Medication Sig Start Date End Date Taking? Authorizing Provider  acidophilus (RISAQUAD) CAPS capsule Take 1 capsule by mouth daily.   Yes [provider]  furosemide (LASIX) 40 MG tablet Take 20-40 mg by mouth daily as needed.   Yes [provider]  lactose free nutrition (BOOST) LIQD Take 237 mLs by mouth daily at 6 (six) AM.   Yes [provider]  levothyroxine  (SYNTHROID, LEVOTHROID) 50 MCG tablet Take 50 mcg by mouth daily before breakfast.   Yes [provider]  loperamide (IMODIUM) 2 MG capsule Take 2 capsules (4 mg total) by mouth 4 (four) times daily -  before meals and at bedtime. 11/11/18  Yes Lucretia Roers, MD  potassium chloride (KLOR-CON) 10 MEQ tablet Take 1 tablet (10 mEq total) by mouth daily as needed. Take when you take Lasix/Furosemide 06/10/23  Yes Emokpae, Courage, MD  tamsulosin (FLOMAX) 0.4 MG CAPS capsule Take 1 capsule (0.4 mg total) by mouth daily after supper. 06/10/23  Yes Emokpae, Courage, MD  Vitamin D, Ergocalciferol, (DRISDOL) 1.25 MG (50000 UNIT) CAPS capsule Take 1 capsule by mouth once a week 09/05/23  Yes Doreatha Massed, MD  DAPTOmycin (CUBICIN) 500 MG injection Inject into the vein. Patient not taking: Reported on 10/17/2023 10/06/23   [provider]    Physical Exam: BP 136/67   Pulse (!) 52   Temp 98 F (36.7 C) (Oral)   Resp 18   Ht 6' (1.829 m)  Wt 88.9 kg   SpO2 98%   BMI 26.58 kg/m   General: 83 y.o. year-old male well developed well nourished in no acute distress.  Alert and oriented x3. HEENT: NCAT, EOMI, noted scleral icterus. Neck: Supple, trachea medial Cardiovascular: Regular rate and rhythm with no rubs or gallops.  No thyromegaly or JVD noted.  No lower extremity edema. 2/4 pulses in all 4 extremities. Respiratory: Clear to auscultation with no wheezes or rales. Good inspiratory effort. Abdomen: Soft, nontender nondistended with normal bowel sounds x4 quadrants. Muskuloskeletal: No cyanosis, clubbing or edema noted bilaterally Neuro: CN II-XII intact, strength 5/5 x 4, sensation, reflexes intact Skin: Jaundiced.  No ulcerative lesions noted or rashes Psychiatry: Mood is appropriate for condition and setting          Labs on Admission:  Basic Metabolic Panel: Recent Labs  Lab 10/17/23 1415  NA 131*  K 4.1  CL 100  CO2 24  GLUCOSE 238*  BUN 14  CREATININE  1.04  CALCIUM 8.2*   Liver Function Tests: Recent Labs  Lab 10/17/23 1415  AST 109*  ALT 71*  ALKPHOS 121  BILITOT 11.8*  PROT 6.6  ALBUMIN 1.9*   Recent Labs  Lab 10/17/23 1415  LIPASE 29   Recent Labs  Lab 10/17/23 1958  AMMONIA 17   CBC: Recent Labs  Lab 10/17/23 1415  WBC 6.4  NEUTROABS 5.0  HGB 9.9*  HCT 28.4*  MCV 108.0*  PLT 99*   Cardiac Enzymes: No results for input(s): "CKTOTAL", "CKMB", "CKMBINDEX", "TROPONINI" in the last 168 hours.  BNP (last 3 results) Recent Labs    10/17/23 1418  BNP 163.0*    ProBNP (last 3 results) No results for input(s): "PROBNP" in the last 8760 hours.  CBG: No results for input(s): "GLUCAP" in the last 168 hours.  Radiological Exams on Admission: CT ABDOMEN PELVIS W CONTRAST Result Date: 10/17/2023 CLINICAL DATA:  Liver failure.  Jaundice. EXAM: CT ABDOMEN AND PELVIS WITH CONTRAST TECHNIQUE: Multidetector CT imaging of the abdomen and pelvis was performed using the standard protocol following bolus administration of intravenous contrast. RADIATION DOSE REDUCTION: This exam was performed according to the departmental dose-optimization program which includes automated exposure control, adjustment of the mA and/or kV according to patient size and/or use of iterative reconstruction technique. CONTRAST:  OMNIPAQUE IOHEXOL 300 MG/ML  SOLN COMPARISON:  09/19/2023 FINDINGS: Lower chest: Bilateral pleural effusions. Right pleural effusion is larger than left. Hepatobiliary: Liver is small to mildly nodular contour. Findings are suggestive for cirrhosis. Gallbladder is surgically absent. Main portal veins are patent. No biliary dilatation. Pancreas: Unremarkable. No pancreatic ductal dilatation or surrounding inflammatory changes. Spleen: Spleen is mildly prominent for size but stable. No focal abnormality. Adrenals/Urinary Tract: Normal adrenal glands. Small right renal cysts. No hydronephrosis. Normal appearance of the urinary  bladder. Stomach/Bowel: Possible small hiatal hernia. Small esophageal varices. No acute abnormality involving the stomach. Evidence for colectomy and right abdominal ileostomy. No evidence for bowel dilatation or obstruction. Diffuse mesenteric edema is again noted. Vascular/Lymphatic: Atherosclerotic calcifications involving the abdominal aorta and iliac arteries without aneurysm. Portal venous system is patent. Evidence for small esophageal varices. IVC and renal veins are patent. No gross abnormality to the iliac veins. No significant lymph node enlargement in the abdomen or pelvis. Reproductive: Prostate is unremarkable. Other: Chronic low-density material in the presacral region that is not significantly changed. This low-density soft tissue roughly measures 4.8 x 2.2 cm image 67/2 and findings are most compatible with postoperative  changes. Diffuse mesenteric edema. Again noted is abdominal ascites particularly around the liver. Subcutaneous edema. Musculoskeletal: No acute bone abnormality. IMPRESSION: 1. No acute abnormality in the abdomen or pelvis. 2. Persistent bilateral pleural effusions, right side larger than left. 3. Stable appearance of the liver without biliary dilatation. 4. Liver is small with evidence of cirrhosis. Evidence for portal hypertension demonstrated by ascites, mild splenomegaly and esophageal varices. 5. Persistent mesenteric edema and subcutaneous edema. 6. Status post colectomy with end ileostomy. Chronic postoperative changes in the presacral space. Electronically Signed   By: Richarda Overlie M.D.   On: 10/17/2023 19:21   DG Chest 2 View Result Date: 10/17/2023 CLINICAL DATA:  prior thora, now w jaundice.  Former smoker. EXAM: CHEST - 2 VIEW COMPARISON:  09/24/2023. FINDINGS: Mild diffuse interstitial prominence. It is uncertain whether this represents artifactual "crowding" of the interstitium related to suboptimal inspiration versus true infiltrate. Bilateral lung fields are  otherwise clear. No acute consolidation or lung collapse. There is blunting of bilateral posterior costophrenic angles, suggesting bilateral small pleural effusions. Stable cardio-mediastinal silhouette. No acute osseous abnormalities. The soft tissues are within normal limits. IMPRESSION: *Mild diffuse interstitial prominence, as discussed above. *Bilateral small pleural effusions. Electronically Signed   By: Jules Schick M.D.   On: 10/17/2023 16:59    EKG: I independently viewed the EKG done and my findings are as followed: EKG was not done in the ED  Assessment/Plan Present on Admission:  Jaundice  Bilateral pleural effusion  Hypoalbuminemia due to protein-calorie malnutrition (HCC)  Transaminitis  Macrocytic anemia  Type 2 diabetes mellitus with hyperglycemia (HCC)  Acquired hypothyroidism  Principal Problem:   Jaundice Active Problems:   Acquired hypothyroidism   Macrocytic anemia   Type 2 diabetes mellitus with hyperglycemia (HCC)   Transaminitis   Bilateral pleural effusion   Hypoalbuminemia due to protein-calorie malnutrition (HCC)   Chronic idiopathic thrombocytopenia (HCC)   Jaundice Patient presents with hyperbilirubinemia -total bilirubin 11.8 Patient has history of liver cirrhosis CT abdomen pelvis showed no acute abnormality in the abdomen/pelvis MRCP will be done in the morning Gastroenterologist (Dr. Marletta Lor) was consulted and will follow-up on patient in the morning per EDP  Bilateral pleural effusion CT abdomen and pelvis showed bilateral pleural effusion, right side larger than left. IV Lasix 40 mg x 1 was given And right-sided thoracentesis in February 2025 IR will be consulted for possible right-sided thoracentesis  Chronic thrombocytopenia Liver cirrhosis, portal hypertension Platelets 99, This is possibly due to patient's history of liver cirrhosis Patient follows with gastroenterology  Hypoalbuminemia possibly secondary to severe calorie  malnutrition Albumin 1.9.  Protein supplement will be provided  Transaminitis AST 109, ALT 71, this is also due to patient's history of liver cirrhosis Continue to monitor liver enzymes  Macrocytic anemia MCV 108.0, hemoglobin 9.9 Vitamin B12 and folate levels will be checked  Type 2 diabetes mellitus with hyperglycemia Hemoglobin A1c on 06/08/2023 was 6.2 No home antidiabetic medication was noted in med rec Continue ISS and hypoglycemia protocol  Acquired hypothyroidism Continue Synthroid  DVT prophylaxis: SCDs  Code Status: Full code  Family Communication: ***   Consults: Gastroenterology  Severity of Illness: The appropriate patient status for this patient is INPATIENT. Inpatient status is judged to be reasonable and necessary in order to provide the required intensity of service to ensure the patient's safety. The patient's presenting symptoms, physical exam findings, and initial radiographic and laboratory data in the context of their chronic comorbidities is felt to place them at high  risk for further clinical deterioration. Furthermore, it is not anticipated that the patient will be medically stable for discharge from the hospital within 2 midnights of admission.   * I certify that at the point of admission it is my clinical judgment that the patient will require inpatient hospital care spanning beyond 2 midnights from the point of admission due to high intensity of service, high risk for further deterioration and high frequency of surveillance required.*  Author: Frankey Shown, DO 10/17/2023 10:55 PM  For on call review [email or web address].

## 2023-10-17 NOTE — ED Provider Notes (Signed)
 Care transferred to me.  CT does not show an obvious cause of the jaundice.  Discussed with Dr. Marletta Lor, he recommends MRCP in the morning (not available at this time).  Patient otherwise will be given Lasix for the fluid seen in his lungs and his peripheral edema though he is specifically not having any shortness of breath or discomfort right now.  He is not altered and has no abdominal pain.  Discussed with Dr. Thomes Dinning for admission.   Pricilla Loveless, MD 10/18/23 Marlyne Beards

## 2023-10-17 NOTE — ED Notes (Signed)
 Patient transported to X-ray

## 2023-10-17 NOTE — ED Triage Notes (Signed)
 Pt arrived via POV from home after being advised to seek evaluation in the ER by his PCP office for jaundice since Friday and fatigue.

## 2023-10-17 NOTE — Telephone Encounter (Signed)
 Discussed with patient to head to ED for urgent evaluation. Patient states he is not having any other symptoms and that he will go to [facility] ED. He asked if he could wait a couple of hours to go and I let him know he should head over as soon as possible for urgent evaluation. He verbalized understanding.

## 2023-10-17 NOTE — ED Provider Notes (Signed)
 [place] EMERGENCY DEPARTMENT AT [facility] Provider Note   CSN: [id number] Arrival date & time: 10/17/23  1318     History {Add pertinent medical, surgical, social history, OB history to HPI:1} Chief Complaint  Patient presents with   Jaundice    EWART CARRERA is a 83 y.o. male.  HPI Patient presents with his wife who assists with the history.  He developed jaundice last week, spoke with his physician on Friday and after again discussing his condition today was sent here for evaluation. Patient notes a history of prior thoracentesis, prior malignancy, but states that he is otherwise been doing generally well.  He has no pain, no dyspnea, no substantially new lower extremity edema, though there is some presently.  No fever, chills, confusion, discoordination.    Home Medications Prior to Admission medications   Medication Sig Start Date End Date Taking? Authorizing Provider  DAPTOmycin (CUBICIN) 500 MG injection Inject into the vein. Patient not taking: Reported on 10/13/2023 10/06/23   [provider]  furosemide (LASIX) 40 MG tablet Take 20-40 mg by mouth daily as needed.    [provider]  lactose free nutrition (BOOST) LIQD Take 237 mLs by mouth daily at 6 (six) AM.    [provider]  levothyroxine (SYNTHROID, LEVOTHROID) 50 MCG tablet Take 50 mcg by mouth daily before breakfast.    [provider]  loperamide (IMODIUM) 2 MG capsule Take 2 capsules (4 mg total) by mouth 4 (four) times daily -  before meals and at bedtime. 11/11/18   Lucretia Roers, MD  potassium chloride (KLOR-CON) 10 MEQ tablet Take 1 tablet (10 mEq total) by mouth daily as needed. Take when you take Lasix/Furosemide 06/10/23   Shon Hale, MD  tamsulosin (FLOMAX) 0.4 MG CAPS capsule Take 1 capsule (0.4 mg total) by mouth daily after supper. 06/10/23   Shon Hale, MD  Vitamin D, Ergocalciferol, (DRISDOL) 1.25 MG (50000 UNIT) CAPS capsule Take 1  capsule by mouth once a week 09/05/23   Doreatha Massed, MD      Allergies    Patient has no known allergies.    Review of Systems   Review of Systems  Physical Exam Updated Vital Signs BP (!) 148/77   Pulse (!) 57   Temp 98 F (36.7 C) (Oral)   Resp 18   Ht 6' (1.829 m)   Wt 88.9 kg   SpO2 99%   BMI 26.58 kg/m  Physical Exam Vitals and nursing note reviewed.  Constitutional:      General: He is not in acute distress.    Appearance: He is well-developed.  HENT:     Head: Normocephalic and atraumatic.  Eyes:     General: Scleral icterus present.     Conjunctiva/sclera: Conjunctivae normal.  Cardiovascular:     Rate and Rhythm: Normal rate and regular rhythm.  Pulmonary:     Effort: Pulmonary effort is normal. No respiratory distress.     Breath sounds: No stridor.  Abdominal:     General: There is no distension.  Skin:    General: Skin is warm and dry.     Coloration: Skin is jaundiced.  Neurological:     Mental Status: He is alert and oriented to person, place, and time.     ED Results / Procedures / Treatments   Labs (all labs ordered are listed, but only abnormal results are displayed) Labs Reviewed  COMPREHENSIVE METABOLIC PANEL  CBC WITH DIFFERENTIAL/PLATELET  LIPASE, BLOOD  URINALYSIS, ROUTINE W REFLEX MICROSCOPIC  BRAIN NATRIURETIC PEPTIDE    EKG None  Radiology No results found.  Procedures Procedures  {Document cardiac monitor, telemetry assessment procedure when appropriate:1}  Medications Ordered in ED Medications - No data to display  ED Course/ Medical Decision Making/ A&P   {   Click here for ABCD2, HEART and other calculatorsREFRESH Note before signing :1}                              Medical Decision Making Adult male presents with new painless jaundice.  Broad differential including malignancy, hepatosteatosis, medication effects, biliary deficit, renal dysfunction.  Cardiac 99 sinus normal Pulse ox 100% room air  normal   Amount and/or Complexity of Data Reviewed Independent Historian: spouse External Data Reviewed: notes. Labs: ordered. Decision-making details documented in ED Course.  Risk Prescription drug management. Decision regarding hospitalization.   ***  {Document critical care time when appropriate:1} {Document review of labs and clinical decision tools ie heart score, Chads2Vasc2 etc:1}  {Document your independent review of radiology images, and any outside records:1} {Document your discussion with family members, caretakers, and with consultants:1} {Document social determinants of health affecting pt's care:1} {Document your decision making why or why not admission, treatments were needed:1} Final Clinical Impression(s) / ED Diagnoses Final diagnoses:  None    Rx / DC Orders ED Discharge Orders     None

## 2023-10-17 NOTE — Telephone Encounter (Signed)
 Patient left vm that his skin and eyes are yellow. He left this message on Saturday. I called him and he said it started Saturday and you could really see the jaundice when out in the sun. Not having any pain. States jaundice is still about the same today as it was on Saturday. He said he had been trying to call to give his heart rate that you wanted him to keep track of and has been unable to get through on the phone. He is asking for you to call him. I let him know you may not be able to call and would have me call back but he did ask again for you to call .   [phone]  Heart rate readings.  25th - 62 26th -55 28th -64 1st - 64 2nd - 64 3rd - 64 5th -76 6th - 84

## 2023-10-18 ENCOUNTER — Telehealth: Payer: Self-pay | Admitting: Gastroenterology

## 2023-10-18 ENCOUNTER — Encounter (INDEPENDENT_AMBULATORY_CARE_PROVIDER_SITE_OTHER): Payer: Self-pay

## 2023-10-18 ENCOUNTER — Inpatient Hospital Stay (HOSPITAL_COMMUNITY)

## 2023-10-18 DIAGNOSIS — R188 Other ascites: Secondary | ICD-10-CM

## 2023-10-18 DIAGNOSIS — I8500 Esophageal varices without bleeding: Secondary | ICD-10-CM

## 2023-10-18 DIAGNOSIS — D689 Coagulation defect, unspecified: Secondary | ICD-10-CM | POA: Diagnosis not present

## 2023-10-18 DIAGNOSIS — D696 Thrombocytopenia, unspecified: Secondary | ICD-10-CM | POA: Diagnosis not present

## 2023-10-18 DIAGNOSIS — Z87891 Personal history of nicotine dependence: Secondary | ICD-10-CM

## 2023-10-18 DIAGNOSIS — K7460 Unspecified cirrhosis of liver: Secondary | ICD-10-CM

## 2023-10-18 DIAGNOSIS — K766 Portal hypertension: Secondary | ICD-10-CM | POA: Diagnosis not present

## 2023-10-18 DIAGNOSIS — Z85038 Personal history of other malignant neoplasm of large intestine: Secondary | ICD-10-CM | POA: Diagnosis not present

## 2023-10-18 DIAGNOSIS — J90 Pleural effusion, not elsewhere classified: Secondary | ICD-10-CM

## 2023-10-18 DIAGNOSIS — R17 Unspecified jaundice: Secondary | ICD-10-CM | POA: Diagnosis not present

## 2023-10-18 LAB — COMPREHENSIVE METABOLIC PANEL
ALT: 62 U/L — ABNORMAL HIGH (ref 0–44)
AST: 95 U/L — ABNORMAL HIGH (ref 15–41)
Albumin: 1.7 g/dL — ABNORMAL LOW (ref 3.5–5.0)
Alkaline Phosphatase: 121 U/L (ref 38–126)
Anion gap: 4 — ABNORMAL LOW (ref 5–15)
BUN: 13 mg/dL (ref 8–23)
CO2: 26 mmol/L (ref 22–32)
Calcium: 7.9 mg/dL — ABNORMAL LOW (ref 8.9–10.3)
Chloride: 103 mmol/L (ref 98–111)
Creatinine, Ser: 0.85 mg/dL (ref 0.61–1.24)
GFR, Estimated: >60 mL/min (ref >60)
Glucose, Bld: 114 mg/dL — ABNORMAL HIGH (ref 70–99)
Potassium: 3.5 mmol/L (ref 3.5–5.1)
Sodium: 133 mmol/L — ABNORMAL LOW (ref 135–145)
Total Bilirubin: 10.4 mg/dL — ABNORMAL HIGH (ref 0.0–1.2)
Total Protein: 5.9 g/dL — ABNORMAL LOW (ref 6.5–8.1)

## 2023-10-18 LAB — CBC
HCT: 26.2 % — ABNORMAL LOW (ref 39.0–52.0)
Hemoglobin: 9.1 g/dL — ABNORMAL LOW (ref 13.0–17.0)
MCH: 37.1 pg — ABNORMAL HIGH (ref 26.0–34.0)
MCHC: 34.7 g/dL (ref 30.0–36.0)
MCV: 106.9 fL — ABNORMAL HIGH (ref 80.0–100.0)
Platelets: 87 10*3/uL — ABNORMAL LOW (ref 150–400)
RBC: 2.45 MIL/uL — ABNORMAL LOW (ref 4.22–5.81)
RDW: 16.5 % — ABNORMAL HIGH (ref 11.5–15.5)
WBC: 5.6 10*3/uL (ref 4.0–10.5)
nRBC: 0 % (ref 0.0–0.2)

## 2023-10-18 LAB — PHOSPHORUS: Phosphorus: 2.8 mg/dL (ref 2.5–4.6)

## 2023-10-18 LAB — FOLATE: Folate: 14.1 ng/mL (ref >5.9)

## 2023-10-18 LAB — MAGNESIUM: Magnesium: 1.8 mg/dL (ref 1.7–2.4)

## 2023-10-18 LAB — CBG MONITORING, ED
Glucose-Capillary: 164 mg/dL — ABNORMAL HIGH (ref 70–99)
Glucose-Capillary: 55 mg/dL — ABNORMAL LOW (ref 70–99)
Glucose-Capillary: 96 mg/dL (ref 70–99)

## 2023-10-18 LAB — VITAMIN B12: Vitamin B-12: 1037 pg/mL — ABNORMAL HIGH (ref 180–914)

## 2023-10-18 LAB — GLUCOSE, CAPILLARY: Glucose-Capillary: 87 mg/dL (ref 70–99)

## 2023-10-18 LAB — BILIRUBIN, DIRECT: Bilirubin, Direct: 4.7 mg/dL — ABNORMAL HIGH (ref 0.0–0.2)

## 2023-10-18 MED ORDER — TAMSULOSIN HCL 0.4 MG PO CAPS: 0.4000 mg | ORAL_CAPSULE | Freq: Every day | ORAL | Status: DC

## 2023-10-18 MED ORDER — FUROSEMIDE 20 MG PO TABS: 20.0000 mg | ORAL_TABLET | Freq: Every day | ORAL | 11 refills | Status: DC

## 2023-10-18 MED ORDER — SPIRONOLACTONE 50 MG PO TABS: 50.0000 mg | ORAL_TABLET | Freq: Every day | ORAL | 11 refills | Status: DC

## 2023-10-18 MED ORDER — LOPERAMIDE HCL 2 MG PO CAPS
4.0000 mg | ORAL_CAPSULE | Freq: Three times a day (TID) | ORAL | Status: DC
Start: 2023-10-18 — End: 2023-10-18

## 2023-10-18 MED ORDER — GADOBUTROL 1 MMOL/ML IV SOLN
7.0000 mL | Freq: Once | INTRAVENOUS | Status: AC | PRN
  Administered 2023-10-18: 7 mL via INTRAVENOUS

## 2023-10-18 MED ORDER — ORAL CARE MOUTH RINSE: 15.0000 mL | OROMUCOSAL | Status: DC | PRN

## 2023-10-18 MED ORDER — LEVOTHYROXINE SODIUM 100 MCG PO TABS
50.0000 ug | ORAL_TABLET | Freq: Every day | ORAL | Status: DC
  Administered 2023-10-18: 50 ug via ORAL
  Filled 2023-10-18: qty 1

## 2023-10-18 MED ORDER — RISAQUAD PO CAPS
1.0000 | ORAL_CAPSULE | Freq: Every day | ORAL | Status: DC
  Administered 2023-10-18: 1 via ORAL
  Filled 2023-10-18: qty 1

## 2023-10-18 NOTE — Discharge Summary (Addendum)
 Physician Discharge Summary  [name]:[id number] DOB: June 09, 1941 DOA: 10/17/2023  PCP: Ignatius Specking, MD  Admit date: 10/17/2023 Discharge date: 10/18/2023  Admitted From: Home Disposition: Home  Recommendations for Outpatient Follow-up:  Follow up with PCP in 1-2 weeks CMP/CBC in GI follow-up  Home Health: N/A Equipment/Devices: N/A  Discharge Condition: Fair CODE STATUS: Full code Diet recommendation: Low-salt diet  Discharge summary: The patient is a 83 year old male with history of type 2 diabetes currently not on treatment, BPH, hypothyroidism, colon cancer s/p resection and colostomy, chronic liver cirrhosis and recent strep agalactiae bacteremia and completed daptomycin therapy who was sent to ER for by his GI office with abnormally elevated bilirubin.  Patient with some fatigue and jaundiced skin for at least 2 weeks.  Previous baseline bilirubin about 3.  Bilirubin was more than 10 on presentation. CT scan with bilateral pleural effusions.  Chest x-ray with diffuse interstitial prominence.  GI was consulted from the ER.  Patient underwent MRCP, no evidence of biliary obstruction.  Continue supportive care.  Currently fairly stable.  Seen by gastroenterology.  They recommended discharge and outpatient follow-up.  Patient otherwise remains largely stable.  He does have some evidence of bilateral pleural effusions but is on room air and without respiratory symptoms. Stable to discharge home.  Discharge Diagnoses:  Principal Problem:   Jaundice Active Problems:   Acquired hypothyroidism   Macrocytic anemia   Type 2 diabetes mellitus with hyperglycemia (HCC)   Transaminitis   Bilateral pleural effusion   Hypoalbuminemia due to protein-calorie malnutrition (HCC)   Chronic idiopathic thrombocytopenia [facility])    Discharge Instructions  Discharge Instructions     Diet - low sodium heart healthy   Complete by: As directed    Increase activity slowly   Complete  by: As directed       Allergies as of 10/18/2023   No Known Allergies      Medication List     TAKE these medications    acidophilus Caps capsule Take 1 capsule by mouth daily.   furosemide 20 MG tablet Commonly known as: Lasix Take 1 tablet (20 mg total) by mouth daily. What changed:  medication strength how much to take when to take this reasons to take this   lactose free nutrition Liqd Take 237 mLs by mouth daily at 6 (six) AM.   levothyroxine 50 MCG tablet Commonly known as: SYNTHROID Take 50 mcg by mouth daily before breakfast.   loperamide 2 MG capsule Commonly known as: IMODIUM Take 2 capsules (4 mg total) by mouth 4 (four) times daily -  before meals and at bedtime.   potassium chloride 10 MEQ tablet Commonly known as: KLOR-CON Take 1 tablet (10 mEq total) by mouth daily as needed. Take when you take Lasix/Furosemide   spironolactone 50 MG tablet Commonly known as: Aldactone Take 1 tablet (50 mg total) by mouth daily.   tamsulosin 0.4 MG Caps capsule Commonly known as: FLOMAX Take 1 capsule (0.4 mg total) by mouth daily after supper.   Vitamin D (Ergocalciferol) 1.25 MG (50000 UNIT) Caps capsule Commonly known as: DRISDOL Take 1 capsule by mouth once a week        No Known Allergies  Consultations: Gastroenterology   Procedures/Studies: MR ABDOMEN MRCP W WO CONTAST Result Date: 10/18/2023 CLINICAL DATA:  Jaundice.  History of colon cancer. EXAM: MRI ABDOMEN WITHOUT AND WITH CONTRAST (INCLUDING MRCP) TECHNIQUE: Multiplanar multisequence MR imaging of the abdomen was performed both before and after the administration  of intravenous contrast. Heavily T2-weighted images of the biliary and pancreatic ducts were obtained, and three-dimensional MRCP images were rendered by post processing. CONTRAST:  7mL GADAVIST GADOBUTROL 1 MMOL/ML IV SOLN COMPARISON:  CT AP from 10/17/2023 FINDINGS: Lower chest: Moderate right and small left pleural effusions.  Hepatobiliary: The liver is small with a diffusely nodular contour compatible with cirrhosis. No enhancing liver lesions identified following contrast administration. The gallbladder is surgically absent. Although the MRCP images are nondiagnostic due to motion degradation, there is no intrahepatic or extrahepatic bile duct dilatation. Common bile duct is within normal limits measuring 5 mm. Pancreas:  No signs of main duct dilatation or mass. Spleen:  Normal in size no focal abnormality. Adrenals/Urinary Tract: Normal adrenal glands. There are 2 subcentimeter T2 hyperintense cyst arising off the lateral cortex of the right kidney measuring up to 6 mm. These are compatible with benign Bosniak class 2 cyst. No follow-up imaging recommended. Stomach/Bowel: Stomach appears normal. No pathologic dilatation of the large or small bowel loops. Right abdominal ileostomy. Previous colectomy. Vascular/Lymphatic: Normal caliber of the abdominal aorta. Small esophageal and perisplenic varices identified.No adenopathy identified. Other: Moderate volume of abdominal ascites. No discrete fluid collections identified. Musculoskeletal: No suspicious bone lesions identified. IMPRESSION: 1. Cirrhosis of the liver. 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 2. Moderate right and small left pleural effusions. 3. Previous colectomy with right abdominal ileostomy. Electronically Signed   By: Signa Kell M.D.   On: 10/18/2023 11:35   MR 3D Recon At Scanner Result Date: 10/18/2023 CLINICAL DATA:  Jaundice.  History of colon cancer. EXAM: MRI ABDOMEN WITHOUT AND WITH CONTRAST (INCLUDING MRCP) TECHNIQUE: Multiplanar multisequence MR imaging of the abdomen was performed both before and after the administration of intravenous contrast. Heavily T2-weighted images of the biliary and pancreatic ducts were obtained, and three-dimensional MRCP images were rendered by post processing. CONTRAST:  7mL GADAVIST  GADOBUTROL 1 MMOL/ML IV SOLN COMPARISON:  CT AP from 10/17/2023 FINDINGS: Lower chest: Moderate right and small left pleural effusions. Hepatobiliary: The liver is small with a diffusely nodular contour compatible with cirrhosis. No enhancing liver lesions identified following contrast administration. The gallbladder is surgically absent. Although the MRCP images are nondiagnostic due to motion degradation, there is no intrahepatic or extrahepatic bile duct dilatation. Common bile duct is within normal limits measuring 5 mm. Pancreas:  No signs of main duct dilatation or mass. Spleen:  Normal in size no focal abnormality. Adrenals/Urinary Tract: Normal adrenal glands. There are 2 subcentimeter T2 hyperintense cyst arising off the lateral cortex of the right kidney measuring up to 6 mm. These are compatible with benign Bosniak class 2 cyst. No follow-up imaging recommended. Stomach/Bowel: Stomach appears normal. No pathologic dilatation of the large or small bowel loops. Right abdominal ileostomy. Previous colectomy. Vascular/Lymphatic: Normal caliber of the abdominal aorta. Small esophageal and perisplenic varices identified.No adenopathy identified. Other: Moderate volume of abdominal ascites. No discrete fluid collections identified. Musculoskeletal: No suspicious bone lesions identified. IMPRESSION: 1. Cirrhosis of the liver. 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 2. Moderate right and small left pleural effusions. 3. Previous colectomy with right abdominal ileostomy. Electronically Signed   By: Signa Kell M.D.   On: 10/18/2023 11:35   US Abdomen Limited RUQ (LIVER/GB) Result Date: 10/17/2023 CLINICAL DATA:  Cirrhosis and HCC screening. EXAM: ULTRASOUND ABDOMEN LIMITED RIGHT UPPER QUADRANT COMPARISON:  CT chest abdomen pelvis 09/19/2023 FINDINGS: Gallbladder: Surgically removed Common bile duct: Diameter: 0.6 cm Liver:  Liver has a nodular contour and compatible with  cirrhosis. No discrete liver lesion. Perihepatic ascites. Hepatopetal flow in the main portal vein. Other: Right pleural effusion. IMPRESSION: 1. Cirrhotic liver without a discrete liver lesion. 2. Ascites. 3. Right pleural effusion. Electronically Signed   By: Richarda Overlie M.D.   On: 10/17/2023 19:24   CT ABDOMEN PELVIS W CONTRAST Result Date: 10/17/2023 CLINICAL DATA:  Liver failure.  Jaundice. EXAM: CT ABDOMEN AND PELVIS WITH CONTRAST TECHNIQUE: Multidetector CT imaging of the abdomen and pelvis was performed using the standard protocol following bolus administration of intravenous contrast. RADIATION DOSE REDUCTION: This exam was performed according to the departmental dose-optimization program which includes automated exposure control, adjustment of the mA and/or kV according to patient size and/or use of iterative reconstruction technique. CONTRAST:  OMNIPAQUE IOHEXOL 300 MG/ML  SOLN COMPARISON:  09/19/2023 FINDINGS: Lower chest: Bilateral pleural effusions. Right pleural effusion is larger than left. Hepatobiliary: Liver is small to mildly nodular contour. Findings are suggestive for cirrhosis. Gallbladder is surgically absent. Main portal veins are patent. No biliary dilatation. Pancreas: Unremarkable. No pancreatic ductal dilatation or surrounding inflammatory changes. Spleen: Spleen is mildly prominent for size but stable. No focal abnormality. Adrenals/Urinary Tract: Normal adrenal glands. Small right renal cysts. No hydronephrosis. Normal appearance of the urinary bladder. Stomach/Bowel: Possible small hiatal hernia. Small esophageal varices. No acute abnormality involving the stomach. Evidence for colectomy and right abdominal ileostomy. No evidence for bowel dilatation or obstruction. Diffuse mesenteric edema is again noted. Vascular/Lymphatic: Atherosclerotic calcifications involving the abdominal aorta and iliac arteries without aneurysm. Portal venous system is patent. Evidence for small  esophageal varices. IVC and renal veins are patent. No gross abnormality to the iliac veins. No significant lymph node enlargement in the abdomen or pelvis. Reproductive: Prostate is unremarkable. Other: Chronic low-density material in the presacral region that is not significantly changed. This low-density soft tissue roughly measures 4.8 x 2.2 cm image 67/2 and findings are most compatible with postoperative changes. Diffuse mesenteric edema. Again noted is abdominal ascites particularly around the liver. Subcutaneous edema. Musculoskeletal: No acute bone abnormality. IMPRESSION: 1. No acute abnormality in the abdomen or pelvis. 2. Persistent bilateral pleural effusions, right side larger than left. 3. Stable appearance of the liver without biliary dilatation. 4. Liver is small with evidence of cirrhosis. Evidence for portal hypertension demonstrated by ascites, mild splenomegaly and esophageal varices. 5. Persistent mesenteric edema and subcutaneous edema. 6. Status post colectomy with end ileostomy. Chronic postoperative changes in the presacral space. Electronically Signed   By: Richarda Overlie M.D.   On: 10/17/2023 19:21   DG Chest 2 View Result Date: 10/17/2023 CLINICAL DATA:  prior thora, now w jaundice.  Former smoker. EXAM: CHEST - 2 VIEW COMPARISON:  09/24/2023. FINDINGS: Mild diffuse interstitial prominence. It is uncertain whether this represents artifactual "crowding" of the interstitium related to suboptimal inspiration versus true infiltrate. Bilateral lung fields are otherwise clear. No acute consolidation or lung collapse. There is blunting of bilateral posterior costophrenic angles, suggesting bilateral small pleural effusions. Stable cardio-mediastinal silhouette. No acute osseous abnormalities. The soft tissues are within normal limits. IMPRESSION: *Mild diffuse interstitial prominence, as discussed above. *Bilateral small pleural effusions. Electronically Signed   By: Jules Schick M.D.   On:  10/17/2023 16:59   ECHO TEE Result Date: 09/26/2023    TRANSESOPHOGEAL ECHO REPORT   Patient Name:   JAQUANE BOUGHNER Date of Exam: 09/23/2023 Medical Rec #:  [id number]  Height:       72.0 in Accession #:    [id number]        Weight:       196.2 lb Date of Birth:  11/06/1940         BSA:          2.113 m Patient Age:    82 years          BP:           141/58 mmHg Patient Gender: M                 HR:           72 bpm. [facility] Procedure: Transesophageal Echo, Cardiac Doppler and Color Doppler (Both            Spectral and Color Flow Doppler were utilized during procedure). Indications:     Bacteremia  History:         Patient has prior history of Echocardiogram examinations, most                  recent 09/21/2023. Abnormal ECG, Arrythmias:Atrial Fibrillation,                  Signs/Symptoms:Fever and Bacteremia; Risk Factors:Diabetes and                  Hypertension. Cancer.  Sonographer:     Sheralyn Boatman RDCS Referring Phys:  [id number] [facility] Lennox Diagnosing Phys: Winfield Rast Mallipeddi PROCEDURE: After discussion of the risks and benefits of a TEE, an informed consent was obtained from the patient. The transesophogeal probe was passed without difficulty through the esophogus of the patient. Imaged were obtained with the patient in a left lateral decubitus position. Local oropharyngeal anesthetic was provided with Cetacaine. Sedation performed by different physician. The patient was monitored while under deep sedation. Anesthestetic sedation was provided intravenously by Anesthesiology: 372mg  of Propofol, 100mg  of Lidocaine. Image quality was good. The patient's vital signs; including heart rate, blood pressure, and oxygen saturation; remained stable throughout the procedure. The patient developed no complications during  the procedure.  IMPRESSIONS  1. Left ventricular ejection fraction, by estimation, is 55 to 60%. The left ventricle has normal function. Left ventricular diastolic  function could not be evaluated.  2. Right ventricular systolic function is normal. The right ventricular size is normal.  3. No left atrial/left atrial appendage thrombus was detected. The LAA emptying velocity was 26 cm/s.  4. Moderate pleural effusion in both left and right lateral regions.  5. The mitral valve is normal in structure. Trivial mitral valve regurgitation. No evidence of mitral stenosis.  6. The aortic valve is tricuspid. Aortic valve regurgitation is not visualized. No aortic stenosis is present. Conclusion(s)/Recommendation(s): No evidence of vegetation/infective endocarditis on this transesophageael echocardiogram. FINDINGS  Left Ventricle: Left ventricular ejection fraction, by estimation, is 55 to 60%. The left ventricle has normal function. Strain imaging was not performed. The left ventricular internal cavity size was normal in size. There is no left ventricular hypertrophy. Left ventricular diastolic function could not be evaluated. Right Ventricle: The right ventricular size is normal. No increase in right ventricular wall thickness. Right ventricular systolic function is normal. Left Atrium: Left atrial size was not assessed. No left atrial/left atrial appendage thrombus was detected. The LAA emptying velocity was 26 cm/s. Right Atrium: Right atrial size was not assessed. Pericardium: Trivial pericardial effusion is present. The pericardial effusion is anterior to the  right ventricle. Mitral Valve: The mitral valve is normal in structure. Trivial mitral valve regurgitation. No evidence of mitral valve stenosis. Tricuspid Valve: The tricuspid valve is normal in structure. Tricuspid valve regurgitation is trivial. No evidence of tricuspid stenosis. Aortic Valve: The aortic valve is tricuspid. Aortic valve regurgitation is not visualized. No aortic stenosis is present. Aortic valve mean gradient measures 5.0 mmHg. Aortic valve peak gradient measures 8.1 mmHg. Pulmonic Valve: The pulmonic  valve was not well visualized. Pulmonic valve regurgitation is not visualized. Aorta: The aortic root and ascending aorta are structurally normal, with no evidence of dilitation. IAS/Shunts: No atrial level shunt detected by color flow Doppler. Additional Comments: 3D imaging was not performed. There is a moderate pleural effusion in both left and right lateral regions. Mild ascites is present. Spectral Doppler performed. AORTIC VALVE AV Vmax:           142.00 cm/s AV Vmean:          101.000 cm/s AV VTI:            0.299 m AV Peak Grad:      8.1 mmHg AV Mean Grad:      5.0 mmHg LVOT Vmax:         88.60 cm/s LVOT Vmean:        53.100 cm/s LVOT VTI:          0.154 m LVOT/AV VTI ratio: 0.52  SHUNTS Systemic VTI: 0.15 m Vishnu Priya Mallipeddi Electronically signed by Winfield Rast Mallipeddi Signature Date/Time: 09/26/2023/12:44:51 PM    Final    DG CHEST PORT 1 VIEW Result Date: 09/24/2023 CLINICAL DATA:  PICC line placement. EXAM: PORTABLE CHEST 1 VIEW COMPARISON:  09/21/2023 FINDINGS: Right arm PICC line is identified with tip at the superior cavoatrial junction the heart size appears within normal limits. Lung volumes are low. Blunting of the costophrenic angles. Mild diffuse increase interstitial markings noted which may reflect pulmonary vascular congestion or early pulmonary edema. Atelectasis noted in the right base. IMPRESSION: 1. Right arm PICC line tip is at the superior cavoatrial junction. 2. Low lung volumes. 3. Mild diffuse increase interstitial markings may reflect pulmonary vascular congestion or early pulmonary edema. 4. Blunting of the costophrenic angles suggest small residual pleural effusions. Electronically Signed   By: Signa Kell M.D.   On: 09/24/2023 12:13   [place] EKG SITE RITE Result Date: 09/23/2023 If Site Rite image not attached, placement could not be confirmed due to current cardiac rhythm.  ECHOCARDIOGRAM COMPLETE Result Date: 09/21/2023    ECHOCARDIOGRAM REPORT   Patient Name:    Jose Lawrence Date of Exam: 09/21/2023 Medical Rec #:  [id number]         Height:       72.0 in Accession #:    [id number]        Weight:       196.2 lb Date of Birth:  1940/10/02         BSA:          2.113 m Patient Age:    82 years          BP:           120/59 mmHg Patient Gender: M                 HR:           41 bpm. [facility] Procedure: 2D Echo, Color Doppler and Cardiac Doppler (Both Spectral and Color  Flow Doppler were utilized during procedure). Indications:    Endocarditis [[id number]]  History:        Patient has no prior history of Echocardiogram examinations and                 Patient has prior history of Echocardiogram examinations, most                 recent 05/30/2023. Arrythmias:Atrial Fibrillation; Risk                 Factors:Hypertension and Diabetes.  Sonographer:    Webb Laws Referring Phys: [id number] [facility] SINGH IMPRESSIONS  1. Left ventricular ejection fraction, by estimation, is 55 to 60%. The left ventricle has normal function. The left ventricle has no regional wall motion abnormalities. Left ventricular diastolic parameters are indeterminate.  2. Right ventricular systolic function is low normal. The right ventricular size is mildly enlarged. There is moderately elevated pulmonary artery systolic pressure. The estimated right ventricular systolic pressure is 46.1 mmHg.  3. Left atrial size was upper normal.  4. Right atrial size was mildly dilated.  5. Ascitic fluid and left pleural effusion noted.  6. The mitral valve is grossly normal. Mild mitral valve regurgitation.  7. The aortic valve is tricuspid. There is mild calcification of the aortic valve. Aortic valve regurgitation is not visualized. Aortic valve sclerosis is present, with no evidence of aortic valve stenosis.  8. The inferior vena cava is dilated in size with <50% respiratory variability, suggesting right atrial pressure of 15 mmHg. Comparison(s): Prior images reviewed side by side. No  obvious valvular vegetations. FINDINGS  Left Ventricle: Left ventricular ejection fraction, by estimation, is 55 to 60%. The left ventricle has normal function. The left ventricle has no regional wall motion abnormalities. Strain imaging was not performed. The left ventricular internal cavity  size was normal in size. There is no left ventricular hypertrophy. Left ventricular diastolic function could not be evaluated due to atrial fibrillation. Left ventricular diastolic parameters are indeterminate. Right Ventricle: The right ventricular size is mildly enlarged. No increase in right ventricular wall thickness. Right ventricular systolic function is low normal. There is moderately elevated pulmonary artery systolic pressure. The tricuspid regurgitant  velocity is 2.79 m/s, and with an assumed right atrial pressure of 15 mmHg, the estimated right ventricular systolic pressure is 46.1 mmHg. Left Atrium: Left atrial size was upper normal. Right Atrium: Right atrial size was mildly dilated. Pericardium: Ascitic fluid and left pleural effusion noted. There is no evidence of pericardial effusion. Mitral Valve: The mitral valve is grossly normal. Mild mitral valve regurgitation. Tricuspid Valve: The tricuspid valve is grossly normal. Tricuspid valve regurgitation is trivial. Aortic Valve: The aortic valve is tricuspid. There is mild calcification of the aortic valve. There is mild aortic valve annular calcification. Aortic valve regurgitation is not visualized. Aortic valve sclerosis is present, with no evidence of aortic valve stenosis. Pulmonic Valve: The pulmonic valve was grossly normal. Pulmonic valve regurgitation is trivial. Aorta: The aortic root is normal in size and structure. Venous: The inferior vena cava is dilated in size with less than 50% respiratory variability, suggesting right atrial pressure of 15 mmHg. IAS/Shunts: No atrial level shunt detected by color flow Doppler. Additional Comments: 3D imaging was  not performed.  LEFT VENTRICLE PLAX 2D LVIDd:         4.60 cm   Diastology LVIDs:         2.90 cm   LV e' medial:  7.07 cm/s LV PW:         1.00 cm   LV E/e' medial:  14.4 LV IVS:        0.90 cm   LV e' lateral:   10.60 cm/s LVOT diam:     1.80 cm   LV E/e' lateral: 9.6 LV SV:         55 LV SV Index:   26 LVOT Area:     2.54 cm  RIGHT VENTRICLE            IVC RV Basal diam:  4.70 cm    IVC diam: 2.70 cm RV Mid diam:    3.80 cm RV S prime:     8.81 cm/s TAPSE (M-mode): 2.2 cm LEFT ATRIUM             Index        RIGHT ATRIUM           Index LA diam:        4.30 cm 2.03 cm/m   RA Area:     24.30 cm LA Vol (A2C):   52.6 ml 24.89 ml/m  RA Volume:   75.30 ml  35.63 ml/m LA Vol (A4C):   66.3 ml 31.37 ml/m LA Biplane Vol: 60.3 ml 28.53 ml/m  AORTIC VALVE LVOT Vmax:   83.90 cm/s LVOT Vmean:  60.500 cm/s LVOT VTI:    0.215 m  AORTA Ao Root diam: 3.30 cm MITRAL VALVE                TRICUSPID VALVE MV Area (PHT): 2.77 cm     TR Peak grad:   31.1 mmHg MV Decel Time: 274 msec     TR Vmax:        279.00 cm/s MV E velocity: 102.00 cm/s                             SHUNTS                             Systemic VTI:  0.22 m                             Systemic Diam: 1.80 cm Nona Dell MD Electronically signed by Nona Dell MD Signature Date/Time: 09/21/2023/3:46:39 PM    Final    DG Chest 1 View Result Date: 09/21/2023 CLINICAL DATA:  Right pleural effusion, status post thoracentesis EXAM: CHEST  1 VIEW COMPARISON:  09/19/2023 FINDINGS: Nearly resolved right effusion following thoracentesis. Improved right basilar aeration. Persistent low lung volumes with bibasilar atelectasis/opacities. No pneumothorax. Trachea midline. Left subclavian power port catheter tip proximal SVC as before. Aorta atherosclerotic. IMPRESSION: Nearly resolved right effusion following thoracentesis. No pneumothorax. Electronically Signed   By: Judie Petit.  Shick M.D.   On: 09/21/2023 11:07   US THORACENTESIS ASP PLEURAL SPACE W/IMG GUIDE Result  Date: 09/21/2023 INDICATION: Colon cancer, shortness of breath, right pleural effusion EXAM: ULTRASOUND GUIDED RIGHT THORACENTESIS MEDICATIONS: 1% lidocaine local COMPLICATIONS: None immediate. PROCEDURE: An ultrasound guided thoracentesis was thoroughly discussed with the patient and questions answered. The benefits, risks, alternatives and complications were also discussed. The patient understands and wishes to proceed with the procedure. Written consent was obtained. Ultrasound was performed to localize and mark an adequate pocket of fluid in the right chest. The area was then prepped and draped in  the normal sterile fashion. 1% Lidocaine was used for local anesthesia. Under ultrasound guidance a 6 Fr Safe-T-Centesis catheter was introduced. Thoracentesis was performed. The catheter was removed and a dressing applied. FINDINGS: A total of approximately 2.1 L of clear pleural fluid was removed. Samples were sent to the laboratory as requested by the clinical team. IMPRESSION: Successful ultrasound guided right thoracentesis yielding 2.1 L of pleural fluid. Electronically Signed   By: Judie Petit.  Shick M.D.   On: 09/21/2023 11:04   CT CHEST ABDOMEN PELVIS W CONTRAST Result Date: 09/19/2023 CLINICAL DATA:  Sepsis. c/o cough, congestion, fever and flu-like symptoms since Saturday EXAM: CT CHEST, ABDOMEN, AND PELVIS WITH CONTRAST TECHNIQUE: Multidetector CT imaging of the chest, abdomen and pelvis was performed following the standard protocol during bolus administration of intravenous contrast. RADIATION DOSE REDUCTION: This exam was performed according to the departmental dose-optimization program which includes automated exposure control, adjustment of the mA and/or kV according to patient size and/or use of iterative reconstruction technique. CONTRAST:  OMNIPAQUE IOHEXOL 300 MG/ML  SOLN COMPARISON:  Chest x-ray 09/19/2023, CT abdomen pelvis 04/22/2023, CT abdomen pelvis 04/04/2023, CT abdomen pelvis 10/03/2021  FINDINGS: CT CHEST FINDINGS Cardiovascular: Mildly enlarged right heart size. No significant pericardial effusion. The thoracic aorta is normal in caliber. Mild atherosclerotic plaque of the thoracic aorta. Four-vessel coronary artery calcifications. Aortic valve leaflet calcification. Mediastinum/Nodes: No enlarged mediastinal, hilar, or axillary lymph nodes. Thyroid gland, trachea, and esophagus demonstrate no significant findings. Lungs/Pleura: Bilateral lower lobe passive atelectasis. Almost complete collapse of the right lower lobe. No focal consolidation. No pulmonary nodule. No pulmonary mass. Interval increase in size of a moderate right and small left pleural effusion. No pneumothorax. Musculoskeletal: No chest wall abnormality.  Bilateral gynecomastia. No suspicious lytic or blastic osseous lesions. No acute displaced fracture. Multilevel degenerative changes of the spine. CT ABDOMEN PELVIS FINDINGS Hepatobiliary: Nodular hepatic contour. No focal liver abnormality. Status post cholecystectomy. No biliary dilatation. Pancreas: Diffusely atrophic. No focal lesion. Otherwise normal pancreatic contour. No surrounding inflammatory changes. No main pancreatic ductal dilatation. Spleen: Normal in size without focal abnormality. Adrenals/Urinary Tract: No adrenal nodule bilaterally. Bilateral kidneys enhance symmetrically. Question mild left urothelial thickening. No hydronephrosis. No hydroureter.  No nephroureterolithiasis. The urinary bladder is unremarkable. On delayed imaging, there is no urothelial wall thickening and there are no filling defects in the opacified portions of the bilateral collecting systems or ureters. 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 Rectal surgical changes with Redemonstration of extensive presacral mesenteric fat stranding and soft tissue density. Similar orientation of several loops of the small bowel within the left anterior abdomen suggestive of  possible underlying adhesions. No associated obstruction. Stomach is within normal limits. Majority of the small bowel is decompressed. No evidence of bowel wall thickening or dilatation. No pneumatosis. Appendix appears normal. Vascular/Lymphatic: Recanalized paraumbilical vein. Paraesophageal varices. The portal, splenic, superior mesenteric veins are patent. No abdominal aorta or iliac aneurysm. Mild atherosclerotic plaque of the aorta and its branches. No abdominal, pelvic, or inguinal lymphadenopathy. Reproductive: Prostate is unremarkable. Other: Grossly stable small volume simple free fluid ascites. Associated diffuse mild mesenteric edema. No intraperitoneal free gas. No organized fluid collection. Musculoskeletal: No abdominal wall hernia or abnormality. No suspicious lytic or blastic osseous lesions. No acute displaced fracture. Multilevel degenerative changes of the spine. IMPRESSION: 1. Interval increase in size of a moderate right and small left pleural effusion. Almost complete collapse of the right lower lobe. 2. Mild right cardiomegaly. 3. Question mild left urothelial  thickening. Correlate with urinalysis for infection. 4. Cirrhosis with portal hypertension. No focal liver lesions identified. Please note that liver protocol enhanced MR and CT are the most sensitive tests for the screening detection of hepatocellular carcinoma in the high risk setting of cirrhosis. 5. Grossly stable small volume simple free fluid ascites. 6. 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 Rectal surgical changes with Redemonstration of extensive presacral mesenteric fat stranding and soft tissue density. Markedly limited evaluation of this region. 7. Aortic Atherosclerosis (ICD10-I70.0) including four-vessel coronary artery and aortic valve leaflet calcifications-correlate for aortic stenosis. Electronically Signed   By: Tish Frederickson M.D.   On: 09/19/2023 19:39   DG Chest 2 View Result Date:  09/19/2023 CLINICAL DATA:  Cough EXAM: CHEST - 2 VIEW COMPARISON:  06/08/2023 FINDINGS: Underinflation. Enlarged cardiopericardial silhouette. Central vascular prominence. Developing bilateral small pleural effusion with adjacent opacity, right-greater-than-left. Recommend follow-up. No pneumothorax. Left upper chest subclavian port. Tip overlying the central SVC. Degenerative changes of the spine. IMPRESSION: Developing pleural effusions with adjacent opacity, right-greater-than-left. Recommend follow-up. Persistent enlarged cardiac silhouette with vascular congestion. Chest port Electronically Signed   By: Karen Kays M.D.   On: 09/19/2023 17:39   (Echo, Carotid, EGD, Colonoscopy, ERCP)    Subjective: Seen in the morning rounds.  Without any complaints.   Discharge Exam: Vitals:   10/18/23 1133 10/18/23 1605  BP: 126/77 133/74  Pulse: (!) 57 73  Resp: 15 16  Temp: 97.6 F (36.4 C) 97.6 F (36.4 C)  SpO2: 99% 100%   Vitals:   10/18/23 1015 10/18/23 1030 10/18/23 1133 10/18/23 1605  BP:   126/77 133/74  Pulse: (!) 44 (!) 52 (!) 57 73  Resp:   15 16  Temp:   97.6 F (36.4 C) 97.6 F (36.4 C)  TempSrc:   Oral Oral  SpO2: 96% 97% 99% 100%  Weight:      Height:        General exam: Appears calm and comfortable at rest. Deeply icteric. Respiratory system: Clear to auscultation. Respiratory effort normal. Cardiovascular system: S1 & S2 heard, RRR.  Gastrointestinal system: Abdomen is nondistended, soft and nontender. No organomegaly or masses felt. Normal bowel sounds heard. Colostomy with loose brown stool. Central nervous system: Alert and oriented. No focal neurological deficits.  No tremors or asterixis. Extremities: Symmetric 5 x 5 power. Skin: No rashes, lesions or ulcers Psychiatry: Judgement and insight appear normal. Mood & affect appropriate.     The results of significant diagnostics from this hospitalization (including imaging, microbiology, ancillary and  laboratory) are listed below for reference.     Microbiology: No results found for this or any previous visit (from the past 240 hours).   Labs: BNP (last 3 results) Recent Labs    10/17/23 1418  BNP 163.0*   Basic Metabolic Panel: Recent Labs  Lab 10/17/23 1415 10/18/23 0350  NA 131* 133*  K 4.1 3.5  CL 100 103  CO2 24 26  GLUCOSE 238* 114*  BUN 14 13  CREATININE 1.04 0.85  CALCIUM 8.2* 7.9*  MG  --  1.8  PHOS  --  2.8   Liver Function Tests: Recent Labs  Lab 10/17/23 1415 10/18/23 0350  AST 109* 95*  ALT 71* 62*  ALKPHOS 121 121  BILITOT 11.8* 10.4*  PROT 6.6 5.9*  ALBUMIN 1.9* 1.7*   Recent Labs  Lab 10/17/23 1415  LIPASE 29   Recent Labs  Lab 10/17/23 1958  AMMONIA 17   CBC: Recent Labs  Lab 10/17/23 1415 10/18/23 0350  WBC 6.4 5.6  NEUTROABS 5.0  --   HGB 9.9* 9.1*  HCT 28.4* 26.2*  MCV 108.0* 106.9*  PLT 99* 87*   Cardiac Enzymes: No results for input(s): "CKTOTAL", "CKMB", "CKMBINDEX", "TROPONINI" in the last 168 hours. BNP: Invalid input(s): "POCBNP" CBG: Recent Labs  Lab 10/18/23 0000 10/18/23 0350 10/18/23 0754 10/18/23 1131  GLUCAP 164* 96 55* 87   D-Dimer No results for input(s): "DDIMER" in the last 72 hours. Hgb A1c No results for input(s): "HGBA1C" in the last 72 hours. Lipid Profile No results for input(s): "CHOL", "HDL", "LDLCALC", "TRIG", "CHOLHDL", "LDLDIRECT" in the last 72 hours. Thyroid function studies No results for input(s): "TSH", "T4TOTAL", "T3FREE", "THYROIDAB" in the last 72 hours.  Invalid input(s): "FREET3" Anemia work up Recent Labs    10/18/23 0350  VITAMINB12 1,037*  FOLATE 14.1   Urinalysis    Component Value Date/Time   COLORURINE AMBER (A) 10/17/2023 1455   APPEARANCEUR CLEAR 10/17/2023 1455   APPEARANCEUR Clear 08/30/2023 1409   LABSPEC 1.015 10/17/2023 1455   PHURINE 5.0 10/17/2023 1455   GLUCOSEU 50 (A) 10/17/2023 1455   HGBUR NEGATIVE 10/17/2023 1455   BILIRUBINUR SMALL (A)  10/17/2023 1455   BILIRUBINUR Negative 08/30/2023 1409   KETONESUR NEGATIVE 10/17/2023 1455   PROTEINUR NEGATIVE 10/17/2023 1455   NITRITE NEGATIVE 10/17/2023 1455   LEUKOCYTESUR NEGATIVE 10/17/2023 1455   Sepsis Labs Recent Labs  Lab 10/17/23 1415 10/18/23 0350  WBC 6.4 5.6   Microbiology No results found for this or any previous visit (from the past 240 hours).   Time coordinating discharge: 35 minutes  SIGNED:   Dorcas Carrow, MD  [facility] 10/18/2023, 4:11 PM

## 2023-10-18 NOTE — TOC Initial Note (Signed)
 Transition of Care [facility]) - Initial/Assessment Note    Patient Details  Name: Jose Lawrence MRN: [id number] Date of Birth: May 17, 1941  [facility]) CM/SW Contact:    Beather Arbour Phone Number: 10/18/2023, 11:45 AM  Clinical Narrative:                 Patient is at risk for readmission. Patient was admitted for Jaundice. CSW spoke with patient spouse Hattie. Hattie shared that it is her and patient in the home and that patient is mostly independent , but she helps out, if he needs help. Spouse shared that she does the driving for pt. They have a walker at home which patient rarely uses and that they did have HH come out for an RN since his PICC line came out. TOC will continue to follow.   Expected Discharge Plan: Home/Self Care Barriers to Discharge: Continued Medical Work up   Patient Goals and CMS Choice Patient states their goals for this hospitalization and ongoing recovery are:: return back home CMS Medicare.gov Compare Post Acute Care list provided to:: Patient Represenative (must comment) (Spouse-Hattie) Choice offered to / list presented to : Spouse      Expected Discharge Plan and Services In-house Referral: Clinical Social Work   Post Acute Care Choice: Home Health, Durable Medical Equipment Living arrangements for the past 2 months: Single Family Home                 DME Arranged: N/A DME Agency: NA                  Prior Living Arrangements/Services Living arrangements for the past 2 months: Single Family Home Lives with:: Spouse Patient language and need for interpreter reviewed:: Yes Do you feel safe going back to the place where you live?: Yes      Need for Family Participation in Patient Care: Yes (Comment) Care giver support system in place?: Yes (comment) Current home services: DME Criminal Activity/Legal Involvement Pertinent to Current Situation/Hospitalization: No - Comment as needed  Activities of Daily Living   ADL  Screening (condition at time of admission) Independently performs ADLs?: Yes (appropriate for developmental age) Is the patient deaf or have difficulty hearing?: Yes (wears hearing aids) Does the patient have difficulty seeing, even when wearing glasses/contacts?: Yes (has reading glassess) Does the patient have difficulty concentrating, remembering, or making decisions?: No  Permission Sought/Granted Permission sought to share information with : Facility [employer] Service fast streamer Information with NAME: Hattie     Permission granted to share info w Relationship: Spouse     Emotional Assessment Appearance:: Appears stated age   Affect (typically observed): Appropriate Orientation: : Oriented to Self Alcohol / Substance Use: Not Applicable Psych Involvement: No (comment)  Admission diagnosis:  Hyperbilirubinemia [E80.6] Jaundice [R17] Patient Active Problem List   Diagnosis Date Noted   Jaundice 10/17/2023   Chronic idiopathic thrombocytopenia (HCC) 10/17/2023   Need for hepatitis B screening test 10/13/2023   Need for hepatitis C screening test 10/13/2023   Medication management 10/13/2023   Bacteremia due to Staphylococcus 09/23/2023   Vomiting 09/20/2023   Bilateral pleural effusion 09/20/2023   Hypoalbuminemia due to protein-calorie malnutrition (HCC) 09/20/2023   Acute febrile illness 09/19/2023   Bacteremia due to group B Streptococcus/Agalactiae 06/09/2023   UTI due to Klebsiella species 06/09/2023   Fever 06/08/2023   UTI (urinary tract infection) 06/08/2023   Hematuria 06/08/2023   Secondary esophageal varices without bleeding (HCC)  05/10/2023   Streptococcal bacteremia 04/23/2023   Urinary retention 04/22/2023   Cirrhosis of liver (HCC) 04/22/2023   Hypomagnesemia 04/22/2023   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   Cirrhosis of liver without ascites (HCC) 04/13/2023   Elevated LFTs 09/16/2022   Acute GI bleeding 10/03/2021   Transaminitis     Hypotension due to hypovolemia    Acute renal failure 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) 12/24/2020   Thrombocytopenia (HCC) 12/24/2020   Lactic acidosis 12/24/2020   Permanent atrial fibrillation (HCC) 12/24/2020   Type 2 diabetes mellitus with hyperglycemia (HCC) 12/24/2020   Elevated lactic acid level    Hyperkalemia    Hyponatremia    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   AKI (acute kidney injury) (HCC) 12/23/2020   Lumbar spondylosis 08/26/2020   Body mass index (BMI) 27.0-27.9, adult 07/02/2020   Elevated blood-pressure reading, without diagnosis of hypertension 07/02/2020   Herniated nucleus pulposus, lumbar 06/30/2020   Spondylitis (HCC) 06/30/2020   Lumbar radiculopathy 06/23/2020   Spondylolisthesis at L5-S1 level 06/23/2020   Vitamin B 12 deficiency 07/10/2019   Macrocytic anemia 09/14/2018   Malaise and fatigue 09/14/2018   Indwelling Foley catheter present 09/14/2018   Intraoperative bladder injury 09/04/2018   Postoperative anemia due to acute blood loss 09/04/2018   Cancer of transverse colon (HCC)    Iron deficiency anemia due to chronic blood loss 08/25/2018   Acquired hypothyroidism 08/25/2018   HTN (hypertension) 08/25/2018   Controlled type 2 diabetes mellitus without complication (HCC) 08/25/2018   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 [place] [address] Phone: [phone] Fax: [phone]     Social Drivers of Health (SDOH) Social History: SDOH Screenings   Food Insecurity: No Food Insecurity (10/18/2023)  Housing: Low Risk  (10/18/2023)  Transportation  Needs: No Transportation Needs (10/18/2023)  Utilities: Not At Risk (10/18/2023)  Alcohol Screen: Low Risk  (09/01/2020)  Depression (PHQ2-9): Low Risk  (09/01/2020)  Financial Resource Strain: Low Risk  (09/01/2020)  Physical Activity: Sufficiently Active (09/01/2020)  Social Connections: Socially Integrated (10/18/2023)  Stress: No Stress Concern Present (09/01/2020)  Tobacco Use: Medium Risk (10/17/2023)   SDOH Interventions:     Readmission Risk Interventions    10/18/2023   11:41 AM 09/26/2023   11:29 AM 09/20/2023    9:49 AM  Readmission Risk Prevention Plan  Transportation Screening Complete  Complete  PCP or Specialist Appt within 5-7 Days  Complete   Home Care Screening  Complete   Medication Review (RN CM)  Complete   HRI or Home Care Consult Complete  Complete  Social Work Consult for Recovery Care Planning/Counseling Complete  Complete  Palliative Care Screening Not Applicable  Not Applicable  Medication Review Oceanographer) Complete  Complete

## 2023-10-18 NOTE — Plan of Care (Signed)
  Problem: Coping: Goal: Ability to adjust to condition or change in health will improve Outcome: Progressing   Problem: Fluid Volume: Goal: Ability to maintain a balanced intake and output will improve Outcome: Progressing   Problem: Education: Goal: Knowledge of General Education information will improve Description: Including pain rating scale, medication(s)/side effects and non-pharmacologic comfort measures Outcome: Progressing   Problem: Nutritional: Goal: Maintenance of adequate nutrition will improve Outcome: Not Progressing

## 2023-10-18 NOTE — Consult Note (Addendum)
 @LOGO @   Referring Provider: [facility]  Primary Care Physician:  Ignatius Specking, MD Primary Gastroenterologist:  Dr. Levon Hedger  Date of Admission: 10/17/23 Date of Consultation: 10/18/23  Reason for Consultation: Hyperbilirubinemia/jaundice  HPI:  Jose Lawrence is a 83 y.o. year old male with history of anemia, atrial fibrillation, stroke, PE, T2DM, hypothyroidism, colon cancer s/p status post low anterior resection and APR with removal of rest of colon and with permanent ileostomy, cirrhosis, fairly recent bacteremia with staph  lugdunensis s/p Iv antibiotics with daptomycin through 10/07/23,  who presented to the ER with chief complaint of jaundice.   ED course: Mildly hypertensive with BP 148/77, but otherwise vital signs within normal limits.  Labs remarkable for sodium 131, glucose 238, albumin 1.9, AST 109, ALT 71, total bilirubin 11.8, hemoglobin 9.9 (stable), platelets 99 (stable).  INR 2.6.  BNP mildly elevated at 163.  CT A/P with contrast with small, mildly nodular liver, no biliary dilation, unremarkable pancreas, small esophageal varices, diffuse mesenteric edema, abdominal ascites particularly around the liver, subcutaneous edema.  Chest x-ray with mild diffuse interstitial prominence, bilateral small pleural effusions.  Case was discussed with Dr. Marletta Lor who recommended MRCP in the morning.  He also received a dose of IV Lasix.     Today:  Repeat labs this morning with bilirubin down to 10.4, direct bilirubin 4.7.  AST 95, ALT 62.  Patient reports he noticed jaundice starting about 1 week ago.  States that his bilirubin is always high, but he has never noticed his skin and eyes being this yellow.  Aside from feeling fatigue since he has been hospitalized twice with bacteremia (October 2024 in February 2025), he has been feeling well.  Reports having a good appetite.  States he did lose some weight while he was sick with bacteremia, but has been putting some  weight back on.  He drinks protein shakes.  No abdominal pain, nausea, vomiting.  He does have intermittent swelling in his lower extremities for which she takes Lasix as needed, typically 3 days at a time.  States he also has scrotal swelling.  No mental status changes/confusion.  Output from his ostomy has been normal.  At baseline, he empties his bag anywhere from 4-6 times a day.  He is to take Imodium, 8 pills a day, but often forgets to take it midday as he gets busy.  No BRBPR, melena.    He stays cold, but has not had a fever.  Denies shortness of breath, chest pain, dysuria.   Denies alcohol, illicit drug use.  Takes Tylenol over-the-counter occasionally, but nothing routine.  No herbal supplements or herbal teas.  No other recent medication changes aside from completing his IV antibiotic on 2/28.   MELD 3.0 was 31 on 10/17/23.   Wt Readings from Last 11 Encounters:  10/17/23 88.9 kg  10/13/23 88.9 kg  10/10/23 87.5 kg  10/03/23 89.4 kg  09/23/23 89 kg  06/08/23 89.4 kg  05/10/23 89.5 kg  04/22/23 89.5 kg  04/21/23 90.4 kg  04/12/23 90.4 kg  04/07/23 91.9 kg     Prior cirrhosis work-up:  gG 1628, Elevated ASMA IgG of 26  ANA, AMA wnl.  Iron saturation elevated.  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  Etiology not apparent histologically.  No histologic evidence of autoimmune hepatitis.   Past Medical History:  Diagnosis  Date   Anemia    Causing interruption in anticoagulation   Atrial fibrillation and flutter (HCC)    BPH (benign prostatic hypertrophy)    Cirrhosis (HCC)    Colon cancer (HCC)    Status post LAR 2008   Dysrhythmia    Hypothyroidism    Lacunar infarction (HCC) 11/2012   RT 3rd NERVE PALSY, SB Dr. Lita Mains   PE (pulmonary embolism)    Temporarily on Eliquis 2017   Skin cancer    35 years ago   Type 2 diabetes mellitus  (HCC)    Vitamin B 12 deficiency 07/10/2019    Past Surgical History:  Procedure Laterality Date   ABDOMINOPERINEAL PROCTOCOLECTOMY  2009   end colostomy    BIOPSY  08/25/2018   Procedure: BIOPSY;  Surgeon: Malissa Hippo, MD;  [facility];  Service: Endoscopy;;   BIOPSY  10/04/2021   Procedure: BIOPSY;  Surgeon: Malissa Hippo, MD;  [facility];  Service: Endoscopy;;  small, bowel   BLADDER REPAIR  08/29/2018   Procedure: BLADDER REPAIR;  Surgeon: Lucretia Roers, MD;  Location: [facility];  Service: General;;   COLECTOMY  2008   COLONOSCOPY WITH PROPOFOL N/A 08/25/2018   Procedure: COLONOSCOPY WITH PROPOFOL;  Surgeon: Malissa Hippo, MD;  [facility];  Service: Endoscopy;  Laterality: N/A;  2:20   COLOSTOMY     ESOPHAGOGASTRODUODENOSCOPY (EGD) WITH PROPOFOL N/A 05/10/2023   Procedure: ESOPHAGOGASTRODUODENOSCOPY (EGD) WITH PROPOFOL;  Surgeon: Dolores Frame, MD;  [facility];  Service: Gastroenterology;  Laterality: N/A;  11:00AM;ASA 3   EXPLORATORY LAPAROTOMY  2017   ? pneumoperitoneum of unknown etiology/ negative Ex lap   GIVENS CAPSULE STUDY  10/04/2021   Procedure: GIVENS CAPSULE STUDY;  Surgeon: Malissa Hippo, MD;  [facility];  Service: Endoscopy;;   ILEOSCOPY N/A 10/04/2021   Procedure: ILEOSCOPY THROUGH STOMA;  Surgeon: Malissa Hippo, MD;  [facility];  Service: Endoscopy;  Laterality: N/A;   ILEOSTOMY  08/29/2018   Procedure: ILEOSTOMY;  Surgeon: Lucretia Roers, MD;  Location: [facility];  Service: General;;   LAPAROSCOPIC CHOLECYSTECTOMY  2007   LYSIS OF ADHESION  08/29/2018   Procedure: LYSIS OF ADHESION;  Surgeon: Lucretia Roers, MD;  [facility]: [facility];  Service: General;;   PARTIAL COLECTOMY N/A 08/29/2018   Procedure: PARTIAL COLECTOMY;  Surgeon: Lucretia Roers, MD;  Location: [facility];  Service: General;  Laterality: N/A;   PORT-A-CATH REMOVAL Left 09/23/2023   Procedure: MINOR  REMOVAL PORT-A-CATH;  Surgeon: Marjo Bicker, MD;  Location: [facility];  Service: Cardiovascular;  Laterality: Left;   PORTACATH PLACEMENT Left 10/04/2018   Procedure: INSERTION PORT-A-CATH (attached catheter left subclavian);  Surgeon: Lucretia Roers, MD;  Location[facility];  Service: General;  Laterality: Left;   SBO SURGERY  06/2009   TEE WITHOUT CARDIOVERSION N/A 09/23/2023   Procedure: TRANSESOPHAGEAL ECHOCARDIOGRAM (TEE);  Surgeon: Marjo Bicker, MD;  Location: [facility];  Service: Cardiovascular;  Laterality: N/A;    Prior to Admission medications   Medication Sig Start Date End Date Taking? Authorizing Provider  acidophilus (RISAQUAD) CAPS capsule Take 1 capsule by mouth daily.   Yes [provider]  furosemide (LASIX) 40 MG tablet Take 20-40 mg by mouth daily as needed.   Yes [provider]  lactose free nutrition (BOOST) LIQD Take 237 mLs by mouth daily at 6 (six) AM.   Yes [provider]  levothyroxine (SYNTHROID, LEVOTHROID) 50 MCG tablet Take 50 mcg by mouth daily before breakfast.   Yes [provider]  loperamide (IMODIUM) 2 MG capsule Take 2 capsules (4 mg total) by mouth 4 (four) times daily -  before meals and at bedtime. 11/11/18  Yes Lucretia Roers, MD  potassium chloride (KLOR-CON) 10 MEQ tablet Take 1 tablet (10 mEq total) by mouth daily as needed. Take when you take Lasix/Furosemide 06/10/23  Yes Emokpae, Courage, MD  tamsulosin (FLOMAX) 0.4 MG CAPS capsule Take 1 capsule (0.4 mg total) by mouth daily after supper. 06/10/23  Yes Emokpae, Courage, MD  Vitamin D, Ergocalciferol, (DRISDOL) 1.25 MG (50000 UNIT) CAPS capsule Take 1 capsule by mouth once a week 09/05/23  Yes Doreatha Massed, MD  DAPTOmycin (CUBICIN) 500 MG injection Inject into the vein. Patient not taking: Reported on 10/17/2023 10/06/23   [provider]    Current Facility-Administered Medications  Medication Dose Route Frequency Provider Last Rate  Last Admin   feeding supplement (GLUCERNA SHAKE) (GLUCERNA SHAKE) liquid 237 mL  237 mL Oral TID BM Adefeso, Oladapo, DO       ondansetron (ZOFRAN) tablet 4 mg  4 mg Oral Q6H PRN Adefeso, Oladapo, DO       Or   ondansetron (ZOFRAN) injection 4 mg  4 mg Intravenous Q6H PRN Adefeso, Oladapo, DO       Current Outpatient Medications  Medication Sig Dispense Refill   acidophilus (RISAQUAD) CAPS capsule Take 1 capsule by mouth daily.     furosemide (LASIX) 40 MG tablet Take 20-40 mg by mouth daily as needed.     lactose free nutrition (BOOST) LIQD Take 237 mLs by mouth daily at 6 (six) AM.     levothyroxine (SYNTHROID, LEVOTHROID) 50 MCG tablet Take 50 mcg by mouth daily before breakfast.     loperamide (IMODIUM) 2 MG capsule Take 2 capsules (4 mg total) by mouth 4 (four) times daily -  before meals and at bedtime. 240 capsule 3   potassium chloride (KLOR-CON) 10 MEQ tablet Take 1 tablet (10 mEq total) by mouth daily as needed. Take when you take Lasix/Furosemide 30 tablet 0   tamsulosin (FLOMAX) 0.4 MG CAPS capsule Take 1 capsule (0.4 mg total) by mouth daily after supper. 30 capsule 3   Vitamin D, Ergocalciferol, (DRISDOL) 1.25 MG (50000 UNIT) CAPS capsule Take 1 capsule by mouth once a week 16 capsule 0   DAPTOmycin (CUBICIN) 500 MG injection Inject into the vein. (Patient not taking: Reported on 10/17/2023)      Allergies as of 10/17/2023   (No Known Allergies)    Family History  Problem Relation Age of Onset   Heart disease Father    Heart failure Father    Heart attack Father    Prostate cancer Paternal Uncle     Social History   Socioeconomic History   Marital status: Married    Spouse name: Not on file   Number of children: Not on file   Years of education: Not on file   Highest education level: Not on file  Occupational History   Occupation: Holiday representative    Comment: [employer]  Tobacco Use   Smoking status: Former    Current packs/day: 0.00    Average packs/day: 1 pack/day  for 27.0 years (27.0 ttl pk-yrs)    Types: Cigarettes    Start date: 01/27/1964    Quit date: 01/27/1991    Years since quitting: 32.7   Smokeless tobacco: Former  Types: Dorna Bloom    Quit date: 08/10/1995  Vaping Use   Vaping status: Never Used  Substance and Sexual Activity   Alcohol use: Never    Alcohol/week: 0.0 standard drinks of alcohol   Drug use: Never   Sexual activity: Not Currently  Other Topics Concern   Not on file  Social History Narrative   Not on file   Social Drivers of Health   Financial Resource Strain: Low Risk  (09/01/2020)   Overall Financial Resource Strain (CARDIA)    Difficulty of Paying Living Expenses: Not hard at all  Food Insecurity: No Food Insecurity (09/20/2023)   Hunger Vital Sign    Worried About Running Out of Food in the Last Year: Never true    Ran Out of Food in the Last Year: Never true  Transportation Needs: No Transportation Needs (09/20/2023)   PRAPARE - Administrator, Civil Service (Medical): No    Lack of Transportation (Non-Medical): No  Physical Activity: Sufficiently Active (09/01/2020)   Exercise Vital Sign    Days of Exercise per Week: 7 days    Minutes of Exercise per Session: 30 min  Stress: No Stress Concern Present (09/01/2020)   [employer] of Occupational Health - Occupational Stress Questionnaire    Feeling of Stress : Not at all  Social Connections: Socially Integrated (09/20/2023)   Social Connection and Isolation Panel [NHANES]    Frequency of Communication with Friends and Family: More than three times a week    Frequency of Social Gatherings with Friends and Family: More than three times a week    Attends Religious Services: More than 4 times per year    Active Member of [employer] or Organizations: Yes    Attends Engineer, structural: More than 4 times per year    Marital Status: Married  Catering manager Violence: Not At Risk (09/20/2023)   Humiliation, Afraid, Rape, and Kick questionnaire     Fear of Current or Ex-Partner: No    Emotionally Abused: No    Physically Abused: No    Sexually Abused: No    Review of Systems: Gen: Denies fever, chills, cold or flulike symptoms. CV: Denies chest pain, heart palpitations. Resp: Denies shortness of breath, cough.  GI: See HPI GU : Denies urinary burning, urinary frequency, urinary incontinence.  MS: Denies joint pain. Derm: Denies rash. Psych: Denies depression, anxiety. Heme: See HPI  Physical Exam: Vital signs in last 24 hours: Temp:  [98 F (36.7 C)-98.4 F (36.9 C)] 98.4 F (36.9 C) (03/11 0354) Pulse Rate:  [46-58] 46 (03/11 0700) Resp:  [16-18] 16 (03/11 0530) BP: (118-148)/(36-77) 141/63 (03/11 0700) SpO2:  [91 %-99 %] 97 % (03/11 0700) Weight:  [88.9 kg] 88.9 kg (03/10 1344)   General:   Alert,  Well-developed, well-nourished, pleasant and cooperative in NAD, jaundiced Head:  Normocephalic and atraumatic. Eyes:  + icterus.   Conjunctiva pink. Ears:  Decreased auditory acuity. Nose:  No deformity, discharge,  or lesions. Lungs:  Clear throughout to auscultation.   No wheezes, crackles, or rhonchi. No acute distress. Heart:  Regular rate and rhythm; no murmurs, clicks, rubs,  or gallops. Abdomen:  Soft, and nondistended.  Mild TTP in left mid/lower abdomen.  Ostomy in right lower quadrant with yellow mushy/liquid output.  No masses, hepatosplenomegaly or hernias noted. Normal bowel sounds, without guarding, and without rebound.   Rectal:  Deferred  Msk:  Symmetrical without gross deformities. Normal posture. Pulses:  Normal pulses noted. Extremities:  With 2+ pitting edema bilaterally up to his knees.  Neurologic:  Alert and  oriented x4;  grossly normal neurologically. Skin:  Intact without significant lesions or rashes. Psych:  Normal mood and affect.  Intake/Output from previous day: No intake/output data recorded. Intake/Output this shift: No intake/output data recorded.  Lab Results: Recent Labs     10/17/23 1415 10/18/23 0350  WBC 6.4 5.6  HGB 9.9* 9.1*  HCT 28.4* 26.2*  PLT 99* 87*   BMET Recent Labs    10/17/23 1415 10/18/23 0350  NA 131* 133*  K 4.1 3.5  CL 100 103  CO2 24 26  GLUCOSE 238* 114*  BUN 14 13  CREATININE 1.04 0.85  CALCIUM 8.2* 7.9*   LFT Recent Labs    10/17/23 1415 10/18/23 0350  PROT 6.6 5.9*  ALBUMIN 1.9* 1.7*  AST 109* 95*  ALT 71* 62*  ALKPHOS 121 121  BILITOT 11.8* 10.4*  BILIDIR  --  4.7*   PT/INR Recent Labs    10/17/23 1958  LABPROT 28.3*  INR 2.6*    Studies/Results: CT ABDOMEN PELVIS W CONTRAST Result Date: 10/17/2023 CLINICAL DATA:  Liver failure.  Jaundice. EXAM: CT ABDOMEN AND PELVIS WITH CONTRAST TECHNIQUE: Multidetector CT imaging of the abdomen and pelvis was performed using the standard protocol following bolus administration of intravenous contrast. RADIATION DOSE REDUCTION: This exam was performed according to the departmental dose-optimization program which includes automated exposure control, adjustment of the mA and/or kV according to patient size and/or use of iterative reconstruction technique. CONTRAST:  OMNIPAQUE IOHEXOL 300 MG/ML  SOLN COMPARISON:  09/19/2023 FINDINGS: Lower chest: Bilateral pleural effusions. Right pleural effusion is larger than left. Hepatobiliary: Liver is small to mildly nodular contour. Findings are suggestive for cirrhosis. Gallbladder is surgically absent. Main portal veins are patent. No biliary dilatation. Pancreas: Unremarkable. No pancreatic ductal dilatation or surrounding inflammatory changes. Spleen: Spleen is mildly prominent for size but stable. No focal abnormality. Adrenals/Urinary Tract: Normal adrenal glands. Small right renal cysts. No hydronephrosis. Normal appearance of the urinary bladder. Stomach/Bowel: Possible small hiatal hernia. Small esophageal varices. No acute abnormality involving the stomach. Evidence for colectomy and right abdominal ileostomy. No evidence for  bowel dilatation or obstruction. Diffuse mesenteric edema is again noted. Vascular/Lymphatic: Atherosclerotic calcifications involving the abdominal aorta and iliac arteries without aneurysm. Portal venous system is patent. Evidence for small esophageal varices. IVC and renal veins are patent. No gross abnormality to the iliac veins. No significant lymph node enlargement in the abdomen or pelvis. Reproductive: Prostate is unremarkable. Other: Chronic low-density material in the presacral region that is not significantly changed. This low-density soft tissue roughly measures 4.8 x 2.2 cm image 67/2 and findings are most compatible with postoperative changes. Diffuse mesenteric edema. Again noted is abdominal ascites particularly around the liver. Subcutaneous edema. Musculoskeletal: No acute bone abnormality. IMPRESSION: 1. No acute abnormality in the abdomen or pelvis. 2. Persistent bilateral pleural effusions, right side larger than left. 3. Stable appearance of the liver without biliary dilatation. 4. Liver is small with evidence of cirrhosis. Evidence for portal hypertension demonstrated by ascites, mild splenomegaly and esophageal varices. 5. Persistent mesenteric edema and subcutaneous edema. 6. Status post colectomy with end ileostomy. Chronic postoperative changes in the presacral space. Electronically Signed   By: Richarda Overlie M.D.   On: 10/17/2023 19:21   DG Chest 2 View Result Date: 10/17/2023 CLINICAL DATA:  prior thora, now w jaundice.  Former smoker. EXAM: CHEST - 2 VIEW COMPARISON:  09/24/2023.  FINDINGS: Mild diffuse interstitial prominence. It is uncertain whether this represents artifactual "crowding" of the interstitium related to suboptimal inspiration versus true infiltrate. Bilateral lung fields are otherwise clear. No acute consolidation or lung collapse. There is blunting of bilateral posterior costophrenic angles, suggesting bilateral small pleural effusions. Stable cardio-mediastinal  silhouette. No acute osseous abnormalities. The soft tissues are within normal limits. IMPRESSION: *Mild diffuse interstitial prominence, as discussed above. *Bilateral small pleural effusions. Electronically Signed   By: Jules Schick M.D.   On: 10/17/2023 16:59    Impression:  83 y.o. year old male with history of anemia, atrial fibrillation, stroke, PE, T2DM, hypothyroidism, colon cancer s/p status post low anterior resection and APR with removal of rest of colon and with permanent ileostomy, cirrhosis, fairly recent bacteremia with staph  lugdunensis s/p Iv antibiotics with daptomycin through 10/07/23,  who presented to the ER with chief complaint of painless jaundice, found to have bilirubin of 11.8.  Hyperbilirubinemia/painless jaundice: Bilirubin chronically elevated in the setting of cirrhosis.  Now with acute on chronic elevation to 11.8 without any other significant GI symptoms aside from chronic fatigue over the last few months. LFTs elevated, but not significantly changed from baseline. This morning bilirubin down to 10.4 with direct bilirubin 4.7.  Ct yesterday without biliary abnormality. MRCP has been ordered for further evaluation. 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     Cirrhosis: Etiology not entirely clear.  Laboratory evaluation concerning for AIH with weakly positive ASMA and slightly elevated IgG, but liver biopsy in March 2024 with no histologic evidence of AIH, possibly DILI/chemotherapy effect. Chronically with intermittent LE edema for which he takes lasix PRN; however, he likely needs to be started on a daily diuretic regimen as patient currently has 2+ pitting edema in the bilateral lower extremities and also reports scrotal swelling being a chronic issue for him.  Imaging this admission also shows some mild  ascites though he has no abdominal distention.   Cirrhosis is also complicated by coagulopathy with thrombocytopenia, elevated INR at 2.6 this admission. '  He also has has grade 1 esophageal varices, not currently on BB, previously discontinued during acute illness due to bradycardia. HR remains in the 50 so far this admission.   He has no history of HE.   MELD 3.0 up to 31 yesterday in the setting of hyperbilirubinemia, elevated INR, hypoalbuminemia.   Plan: Follow-up on MRI/MRCP Start Lasix 20 mg daily and spironolactone 50 mg daily. 2g/day sodium restriction.  Will need close monitoring of creatinine and electrolytes. Further recommendations following MRI     LOS: 1 day    10/18/2023, 8:07 AM   Ermalinda Memos, PA-C [facility]  Addendum:  No obstructive or other acute biliary abnormality on MRCP. Spoke with Dr. 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 As patient is feeling well, ok to discharge home with close follow-up. Will need repeat CMP in 1 week to recheck LFTs and Cr/electrolytes as we have also recommended starting Lasix 20 mg daily and spironolactone 50 mg daily.   Ermalinda Memos, PA-C [facility] 10/18/2023

## 2023-10-18 NOTE — ED Notes (Signed)
 Hospitalist at bedside

## 2023-10-18 NOTE — Telephone Encounter (Signed)
 Patient getting discharged from the hospital today. Needs hospital follow-up in 1-2 weeks. Would be fine to schedule next Wednesday or Thursday if patient can make it.   He will also need repeat CMP in 1 week. Would like this to be completed prior to his OV. Please arrange and make patient aware of where/when to have labs completed.

## 2023-10-18 NOTE — Progress Notes (Signed)
 PROGRESS NOTE    Jose Lawrence  ZOX:[id number] DOB: 16-Mar-1941 DOA: 10/17/2023 PCP: Ignatius Specking, MD    Brief Narrative:  The patient is a 83 year old male with history of type 2 diabetes currently not on treatment, BPH, hypothyroidism, colon cancer s/p resection and colostomy, chronic liver cirrhosis and recent strep agalactiae bacteremia and completed daptomycin therapy who was sent to ER for by his GI office with abnormally elevated bilirubin.  Patient with some fatigue and jaundiced skin for at least 2 weeks.  Previous baseline bilirubin about 3.  Bilirubin was more than 10 on presentation. CT scan with bilateral pleural effusions.  Chest x-ray with diffuse interstitial prominence.  GI was consulted from the ER.  Subjective: Patient seen and examined.  He was in the emergency room.  Wife at the bedside.  Hard of hearing.  Patient denies any complaints.  Noted to have blood sugars of 55, received sliding scale insulin last night.  He has been n.p.o. since then.  Came back from MRCP. Assessment & Plan:   Acute on chronic hyperbilirubinemia.  Patient with chronic liver disease and known cirrhosis.  Baseline bilirubin about 3.  Presented with total bilirubin of 11.8. CT scan abdomen pelvis with evidence of cirrhosis, no acute findings. MRCP done today, no evidence of biliary obstruction. Continue supportive care.  Currently fairly stable. GI to make further plans.  Bilateral pleural effusion: As seen on the CT scan.  Patient is on room air.  Does not have any shortness of breath.  Will not suggest thoracentesis today.  Chronic thrombocytopenia, hypoalbuminemia: Secondary to liver disease.  Type 2 diabetes: Hypoglycemic today.  Discontinue all insulin production.  Hypothyroidism: Continue Synthroid.   DVT prophylaxis: SCDs Start: 10/17/23 2251   Code Status: Full code Family Communication: Wife at the bedside Disposition Plan: Status is: Inpatient Remains inpatient appropriate  because: Inpatient procedures and investigations needed.     Consultants:  Gastroenterology  Procedures:  MRCP  Antimicrobials:  None     Objective: Vitals:   10/18/23 1000 10/18/23 1015 10/18/23 1030 10/18/23 1133  BP:    126/77  Pulse: (!) 58 (!) 44 (!) 52 (!) 57  Resp:    15  Temp:    97.6 F (36.4 C)  TempSrc:    Oral  SpO2: 99% 96% 97% 99%  Weight:      Height:       No intake or output data in the 24 hours ending 10/18/23 1146 Filed Weights   10/17/23 1344  Weight: 88.9 kg    Examination:  General exam: Appears calm and comfortable at rest. Deeply icteric. Respiratory system: Clear to auscultation. Respiratory effort normal. Cardiovascular system: S1 & S2 heard, RRR.  Gastrointestinal system: Abdomen is nondistended, soft and nontender. No organomegaly or masses felt. Normal bowel sounds heard. Colostomy with loose brown stool. Central nervous system: Alert and oriented. No focal neurological deficits.  No tremors or asterixis. Extremities: Symmetric 5 x 5 power. Skin: No rashes, lesions or ulcers Psychiatry: Judgement and insight appear normal. Mood & affect appropriate.     Data Reviewed: I have personally reviewed following labs and imaging studies  CBC: Recent Labs  Lab 10/17/23 1415 10/18/23 0350  WBC 6.4 5.6  NEUTROABS 5.0  --   HGB 9.9* 9.1*  HCT 28.4* 26.2*  MCV 108.0* 106.9*  PLT 99* 87*   Basic Metabolic Panel: Recent Labs  Lab 10/17/23 1415 10/18/23 0350  NA 131* 133*  K 4.1 3.5  CL 100 103  CO2 24 26  GLUCOSE 238* 114*  BUN 14 13  CREATININE 1.04 0.85  CALCIUM 8.2* 7.9*  MG  --  1.8  PHOS  --  2.8   GFR: Estimated Creatinine Clearance: 76.1 mL/min (by C-G formula based on SCr of 0.85 mg/dL). Liver Function Tests: Recent Labs  Lab 10/17/23 1415 10/18/23 0350  AST 109* 95*  ALT 71* 62*  ALKPHOS 121 121  BILITOT 11.8* 10.4*  PROT 6.6 5.9*  ALBUMIN 1.9* 1.7*   Recent Labs  Lab 10/17/23 1415  LIPASE 29    Recent Labs  Lab 10/17/23 1958  AMMONIA 17   Coagulation Profile: Recent Labs  Lab 10/17/23 1958  INR 2.6*   Cardiac Enzymes: No results for input(s): "CKTOTAL", "CKMB", "CKMBINDEX", "TROPONINI" in the last 168 hours. BNP (last 3 results) No results for input(s): "PROBNP" in the last 8760 hours. HbA1C: No results for input(s): "HGBA1C" in the last 72 hours. CBG: Recent Labs  Lab 10/18/23 0000 10/18/23 0350 10/18/23 0754 10/18/23 1131  GLUCAP 164* 96 55* 87   Lipid Profile: No results for input(s): "CHOL", "HDL", "LDLCALC", "TRIG", "CHOLHDL", "LDLDIRECT" in the last 72 hours. Thyroid Function Tests: No results for input(s): "TSH", "T4TOTAL", "FREET4", "T3FREE", "THYROIDAB" in the last 72 hours. Anemia Panel: Recent Labs    10/18/23 0350  VITAMINB12 1,037*  FOLATE 14.1   Sepsis Labs: No results for input(s): "PROCALCITON", "LATICACIDVEN" in the last 168 hours.  No results found for this or any previous visit (from the past 240 hours).       Radiology Studies: MR ABDOMEN MRCP W WO CONTAST Result Date: 10/18/2023 CLINICAL DATA:  Jaundice.  History of colon cancer. EXAM: MRI ABDOMEN WITHOUT AND WITH CONTRAST (INCLUDING MRCP) TECHNIQUE: Multiplanar multisequence MR imaging of the abdomen was performed both before and after the administration of intravenous contrast. Heavily T2-weighted images of the biliary and pancreatic ducts were obtained, and three-dimensional MRCP images were rendered by post processing. CONTRAST:  7mL GADAVIST GADOBUTROL 1 MMOL/ML IV SOLN COMPARISON:  CT AP from 10/17/2023 FINDINGS: Lower chest: Moderate right and small left pleural effusions. Hepatobiliary: The liver is small with a diffusely nodular contour compatible with cirrhosis. No enhancing liver lesions identified following contrast administration. The gallbladder is surgically absent. Although the MRCP images are nondiagnostic due to motion degradation, there is no intrahepatic or  extrahepatic bile duct dilatation. Common bile duct is within normal limits measuring 5 mm. Pancreas:  No signs of main duct dilatation or mass. Spleen:  Normal in size no focal abnormality. Adrenals/Urinary Tract: Normal adrenal glands. There are 2 subcentimeter T2 hyperintense cyst arising off the lateral cortex of the right kidney measuring up to 6 mm. These are compatible with benign Bosniak class 2 cyst. No follow-up imaging recommended. Stomach/Bowel: Stomach appears normal. No pathologic dilatation of the large or small bowel loops. Right abdominal ileostomy. Previous colectomy. Vascular/Lymphatic: Normal caliber of the abdominal aorta. Small esophageal and perisplenic varices identified.No adenopathy identified. Other: Moderate volume of abdominal ascites. No discrete fluid collections identified. Musculoskeletal: No suspicious bone lesions identified. IMPRESSION: 1. Cirrhosis of the liver. 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 2. Moderate right and small left pleural effusions. 3. Previous colectomy with right abdominal ileostomy. Electronically Signed   By: Signa Kell M.D.   On: 10/18/2023 11:35   MR 3D Recon At Scanner Result Date: 10/18/2023 CLINICAL DATA:  Jaundice.  History of colon cancer. EXAM: MRI ABDOMEN WITHOUT AND WITH CONTRAST (INCLUDING MRCP) TECHNIQUE: Multiplanar multisequence  MR imaging of the abdomen was performed both before and after the administration of intravenous contrast. Heavily T2-weighted images of the biliary and pancreatic ducts were obtained, and three-dimensional MRCP images were rendered by post processing. CONTRAST:  7mL GADAVIST GADOBUTROL 1 MMOL/ML IV SOLN COMPARISON:  CT AP from 10/17/2023 FINDINGS: Lower chest: Moderate right and small left pleural effusions. Hepatobiliary: The liver is small with a diffusely nodular contour compatible with cirrhosis. No enhancing liver lesions identified following contrast administration.  The gallbladder is surgically absent. Although the MRCP images are nondiagnostic due to motion degradation, there is no intrahepatic or extrahepatic bile duct dilatation. Common bile duct is within normal limits measuring 5 mm. Pancreas:  No signs of main duct dilatation or mass. Spleen:  Normal in size no focal abnormality. Adrenals/Urinary Tract: Normal adrenal glands. There are 2 subcentimeter T2 hyperintense cyst arising off the lateral cortex of the right kidney measuring up to 6 mm. These are compatible with benign Bosniak class 2 cyst. No follow-up imaging recommended. Stomach/Bowel: Stomach appears normal. No pathologic dilatation of the large or small bowel loops. Right abdominal ileostomy. Previous colectomy. Vascular/Lymphatic: Normal caliber of the abdominal aorta. Small esophageal and perisplenic varices identified.No adenopathy identified. Other: Moderate volume of abdominal ascites. No discrete fluid collections identified. Musculoskeletal: No suspicious bone lesions identified. IMPRESSION: 1. Cirrhosis of the liver. 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 2. Moderate right and small left pleural effusions. 3. Previous colectomy with right abdominal ileostomy. Electronically Signed   By: Signa Kell M.D.   On: 10/18/2023 11:35   CT ABDOMEN PELVIS W CONTRAST Result Date: 10/17/2023 CLINICAL DATA:  Liver failure.  Jaundice. EXAM: CT ABDOMEN AND PELVIS WITH CONTRAST TECHNIQUE: Multidetector CT imaging of the abdomen and pelvis was performed using the standard protocol following bolus administration of intravenous contrast. RADIATION DOSE REDUCTION: This exam was performed according to the departmental dose-optimization program which includes automated exposure control, adjustment of the mA and/or kV according to patient size and/or use of iterative reconstruction technique. CONTRAST:  OMNIPAQUE IOHEXOL 300 MG/ML  SOLN COMPARISON:  09/19/2023 FINDINGS:  Lower chest: Bilateral pleural effusions. Right pleural effusion is larger than left. Hepatobiliary: Liver is small to mildly nodular contour. Findings are suggestive for cirrhosis. Gallbladder is surgically absent. Main portal veins are patent. No biliary dilatation. Pancreas: Unremarkable. No pancreatic ductal dilatation or surrounding inflammatory changes. Spleen: Spleen is mildly prominent for size but stable. No focal abnormality. Adrenals/Urinary Tract: Normal adrenal glands. Small right renal cysts. No hydronephrosis. Normal appearance of the urinary bladder. Stomach/Bowel: Possible small hiatal hernia. Small esophageal varices. No acute abnormality involving the stomach. Evidence for colectomy and right abdominal ileostomy. No evidence for bowel dilatation or obstruction. Diffuse mesenteric edema is again noted. Vascular/Lymphatic: Atherosclerotic calcifications involving the abdominal aorta and iliac arteries without aneurysm. Portal venous system is patent. Evidence for small esophageal varices. IVC and renal veins are patent. No gross abnormality to the iliac veins. No significant lymph node enlargement in the abdomen or pelvis. Reproductive: Prostate is unremarkable. Other: Chronic low-density material in the presacral region that is not significantly changed. This low-density soft tissue roughly measures 4.8 x 2.2 cm image 67/2 and findings are most compatible with postoperative changes. Diffuse mesenteric edema. Again noted is abdominal ascites particularly around the liver. Subcutaneous edema. Musculoskeletal: No acute bone abnormality. IMPRESSION: 1. No acute abnormality in the abdomen or pelvis. 2. Persistent bilateral pleural effusions, right side larger than left. 3. Stable appearance of the liver  without biliary dilatation. 4. Liver is small with evidence of cirrhosis. Evidence for portal hypertension demonstrated by ascites, mild splenomegaly and esophageal varices. 5. Persistent mesenteric  edema and subcutaneous edema. 6. Status post colectomy with end ileostomy. Chronic postoperative changes in the presacral space. Electronically Signed   By: Richarda Overlie M.D.   On: 10/17/2023 19:21   DG Chest 2 View Result Date: 10/17/2023 CLINICAL DATA:  prior thora, now w jaundice.  Former smoker. EXAM: CHEST - 2 VIEW COMPARISON:  09/24/2023. FINDINGS: Mild diffuse interstitial prominence. It is uncertain whether this represents artifactual "crowding" of the interstitium related to suboptimal inspiration versus true infiltrate. Bilateral lung fields are otherwise clear. No acute consolidation or lung collapse. There is blunting of bilateral posterior costophrenic angles, suggesting bilateral small pleural effusions. Stable cardio-mediastinal silhouette. No acute osseous abnormalities. The soft tissues are within normal limits. IMPRESSION: *Mild diffuse interstitial prominence, as discussed above. *Bilateral small pleural effusions. Electronically Signed   By: Jules Schick M.D.   On: 10/17/2023 16:59        Scheduled Meds:  feeding supplement (GLUCERNA SHAKE)  237 mL Oral TID BM   Continuous Infusions:   LOS: 1 day    Time spent: 52 minutes    Dorcas Carrow, MD [facility]

## 2023-10-19 ENCOUNTER — Other Ambulatory Visit: Payer: Self-pay | Admitting: *Deleted

## 2023-10-19 DIAGNOSIS — K7460 Unspecified cirrhosis of liver: Secondary | ICD-10-CM

## 2023-10-19 DIAGNOSIS — R7989 Other specified abnormal findings of blood chemistry: Secondary | ICD-10-CM

## 2023-10-19 NOTE — Addendum Note (Signed)
 Addended by: Faythe Dingwall on: 10/19/2023 10:59 AM   Modules accepted: Orders

## 2023-10-19 NOTE — Telephone Encounter (Signed)
 Spoke to pt, informed him of recommendations. Pt voiced understanding. Informed pt to have labs done next week before OV. Labs entered into Epic.

## 2023-10-24 ENCOUNTER — Telehealth: Payer: Self-pay

## 2023-10-24 DIAGNOSIS — K7460 Unspecified cirrhosis of liver: Secondary | ICD-10-CM | POA: Diagnosis not present

## 2023-10-24 NOTE — Telephone Encounter (Signed)
-----   Message from Victoriano Lain sent at 10/24/2023  8:22 AM EDT ----- Please let patient know lab work is negative for any acute abnormality.   HBV and HCV serology is negative He is non immune to Hep B and can get vaccinated through any pharmacy or PCP.

## 2023-10-26 ENCOUNTER — Ambulatory Visit: Admitting: Gastroenterology

## 2023-10-26 LAB — PROTIME-INR
INR: 2 — ABNORMAL HIGH
Prothrombin Time: 20.4 s — ABNORMAL HIGH (ref 9.0–11.5)

## 2023-10-26 LAB — AFP TUMOR MARKER: AFP-Tumor Marker: 1.7 ng/mL (ref <6.1)

## 2023-10-27 DIAGNOSIS — I10 Essential (primary) hypertension: Secondary | ICD-10-CM | POA: Diagnosis not present

## 2023-10-27 DIAGNOSIS — Z09 Encounter for follow-up examination after completed treatment for conditions other than malignant neoplasm: Secondary | ICD-10-CM | POA: Diagnosis not present

## 2023-10-27 DIAGNOSIS — R17 Unspecified jaundice: Secondary | ICD-10-CM | POA: Diagnosis not present

## 2023-10-27 DIAGNOSIS — Z299 Encounter for prophylactic measures, unspecified: Secondary | ICD-10-CM | POA: Diagnosis not present

## 2023-11-01 NOTE — Progress Notes (Unsigned)
 Referring Provider: Ignatius Specking, MD Primary Care Physician:  Ignatius Specking, MD Primary GI Physician: Dr. Levon Hedger  No chief complaint on file.   HPI:   Jose Lawrence is a 83 y.o. male with history of anemia, atrial fibrillation, stroke, PE, T2DM, hypothyroidism, colon cancer s/p status post low anterior resection and APR with removal of rest of colon and with permanent ileostomy, cirrhosis, bacteremia with staph lugdunensis s/p Iv antibiotics with daptomycin through 10/07/23, presenting today for hospital follow-up of painless jaundice.   Patient was briefly admitted overnight 3/10-3/11 after presenting with painless jaundice, found to have bilirubin of 11.8. No significant symptoms aside from chronci fatigue over the last few months. CT and MRCP withotu obstructive or other acute biliary abnormality. 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 Recommended discharge with close OP follow-up, CMP in 1 week. He was also started on Lasix 20 mg daily and spironolactone 50 mg daily for LE edema, reported scrotal swelling, and mild ascites noted on imaging.    CMP was not updated.  INR 2.0 (improved) and AFP wnl on 3/17.   Today:       Past Medical History:  Diagnosis Date   Anemia    Causing interruption in anticoagulation   Atrial fibrillation and flutter (HCC)    BPH (benign prostatic hypertrophy)    Cirrhosis (HCC)    Colon cancer (HCC)    Status post LAR 2008   Dysrhythmia    Hypothyroidism    Lacunar infarction (HCC) 11/2012   RT 3rd NERVE PALSY, SB Dr. Lita Mains   PE (pulmonary embolism)    Temporarily on Eliquis 2017   Skin cancer    35 years ago   Type 2 diabetes mellitus (HCC)    Vitamin B 12 deficiency 07/10/2019    Past Surgical History:  Procedure Laterality Date   ABDOMINOPERINEAL PROCTOCOLECTOMY  2009   end colostomy    BIOPSY  08/25/2018   Procedure: BIOPSY;   Surgeon: Malissa Hippo, MD;  [facility];  Service: Endoscopy;;   BIOPSY  10/04/2021   Procedure: BIOPSY;  Surgeon: Malissa Hippo, MD;  [facility];  Service: Endoscopy;;  small, bowel   BLADDER REPAIR  08/29/2018   Procedure: BLADDER REPAIR;  Surgeon: Lucretia Roers, MD;  Location: [facility];  Service: General;;   COLECTOMY  2008   COLONOSCOPY WITH PROPOFOL N/A 08/25/2018   Procedure: COLONOSCOPY WITH PROPOFOL;  Surgeon: Malissa Hippo, MD;  [facility]: [facility];  Service: Endoscopy;  Laterality: N/A;  2:20   COLOSTOMY     ESOPHAGOGASTRODUODENOSCOPY (EGD) WITH PROPOFOL N/A 05/10/2023   Procedure: ESOPHAGOGASTRODUODENOSCOPY (EGD) WITH PROPOFOL;  Surgeon: Dolores Frame, MD;  [facility]: [facility];  Service: Gastroenterology;  Laterality: N/A;  11:00AM;ASA 3   EXPLORATORY LAPAROTOMY  2017   ? pneumoperitoneum of unknown etiology/ negative Ex lap   GIVENS CAPSULE STUDY  10/04/2021   Procedure: GIVENS CAPSULE STUDY;  Surgeon: Malissa Hippo, MD;  [facility]: [facility];  Service: Endoscopy;;   ILEOSCOPY N/A 10/04/2021   Procedure: ILEOSCOPY THROUGH STOMA;  Surgeon: Malissa Hippo, MD;  [facility]: [facility];  Service: Endoscopy;  Laterality: N/A;   ILEOSTOMY  08/29/2018   Procedure: ILEOSTOMY;  Surgeon: Lucretia Roers, MD;  Location: [facility];  Service: General;;   LAPAROSCOPIC CHOLECYSTECTOMY  2007   LYSIS OF ADHESION  08/29/2018   Procedure: LYSIS OF ADHESION;  Surgeon: Lucretia Roers, MD;  Location: [facility];  Service: General;;   PARTIAL COLECTOMY N/A 08/29/2018   Procedure: PARTIAL COLECTOMY;  Surgeon: Lucretia Roers, MD;  Location: [facility];  Service: General;  Laterality: N/A;   PORT-A-CATH REMOVAL Left 09/23/2023   Procedure: MINOR REMOVAL PORT-A-CATH;  Surgeon: Marjo Bicker, MD;  Location: [facility];  Service: Cardiovascular;  Laterality: Left;   PORTACATH PLACEMENT Left 10/04/2018   Procedure: INSERTION PORT-A-CATH (attached  catheter left subclavian);  Surgeon: Lucretia Roers, MD;  Location: [facility];  Service: General;  Laterality: Left;   SBO SURGERY  06/2009   TEE WITHOUT CARDIOVERSION N/A 09/23/2023   Procedure: TRANSESOPHAGEAL ECHOCARDIOGRAM (TEE);  Surgeon: Marjo Bicker, MD;  Location: [facility];  Service: Cardiovascular;  Laterality: N/A;    Current Outpatient Medications  Medication Sig Dispense Refill   acidophilus (RISAQUAD) CAPS capsule Take 1 capsule by mouth daily.     furosemide (LASIX) 20 MG tablet Take 1 tablet (20 mg total) by mouth daily. 30 tablet 11   lactose free nutrition (BOOST) LIQD Take 237 mLs by mouth daily at 6 (six) AM.     levothyroxine (SYNTHROID, LEVOTHROID) 50 MCG tablet Take 50 mcg by mouth daily before breakfast.     loperamide (IMODIUM) 2 MG capsule Take 2 capsules (4 mg total) by mouth 4 (four) times daily -  before meals and at bedtime. 240 capsule 3   potassium chloride (KLOR-CON) 10 MEQ tablet Take 1 tablet (10 mEq total) by mouth daily as needed. Take when you take Lasix/Furosemide 30 tablet 0   spironolactone (ALDACTONE) 50 MG tablet Take 1 tablet (50 mg total) by mouth daily. 30 tablet 11   tamsulosin (FLOMAX) 0.4 MG CAPS capsule Take 1 capsule (0.4 mg total) by mouth daily after supper. 30 capsule 3   Vitamin D, Ergocalciferol, (DRISDOL) 1.25 MG (50000 UNIT) CAPS capsule Take 1 capsule by mouth once a week 16 capsule 0   No current facility-administered medications for this visit.    Allergies as of 11/03/2023   (No Known Allergies)    Family History  Problem Relation Age of Onset   Heart disease Father    Heart failure Father    Heart attack Father    Prostate cancer Paternal Uncle     Social History   Socioeconomic History   Marital status: Married    Spouse name: Not on file   Number of children: Not on file   Years of education: Not on file   Highest education level: Not on file  Occupational History   Occupation: Holiday representative    Comment:  [employer]  Tobacco Use   Smoking status: Former    Current packs/day: 0.00    Average packs/day: 1 pack/day for 27.0 years (27.0 ttl pk-yrs)    Types: Cigarettes    Start date: 01/27/1964    Quit date: 01/27/1991    Years since quitting: 32.7   Smokeless tobacco: Former    Types: Chew    Quit date: 08/10/1995  Vaping Use   Vaping status: Never Used  Substance and Sexual Activity   Alcohol use: Never    Alcohol/week: 0.0 standard drinks of alcohol   Drug use: Never   Sexual activity: Not Currently  Other Topics Concern   Not on file  Social History Narrative   Not on file   Social Drivers of Health   Financial Resource Strain: Low Risk  (09/01/2020)   Overall Financial Resource Strain (CARDIA)  Difficulty of Paying Living Expenses: Not hard at all  Food Insecurity: No Food Insecurity (10/18/2023)   Hunger Vital Sign    Worried About Running Out of Food in the Last Year: Never true    Ran Out of Food in the Last Year: Never true  Transportation Needs: No Transportation Needs (10/18/2023)   PRAPARE - Administrator, Civil Service (Medical): No    Lack of Transportation (Non-Medical): No  Physical Activity: Sufficiently Active (09/01/2020)   Exercise Vital Sign    Days of Exercise per Week: 7 days    Minutes of Exercise per Session: 30 min  Stress: No Stress Concern Present (09/01/2020)   [employer] of Occupational Health - Occupational Stress Questionnaire    Feeling of Stress : Not at all  Social Connections: Socially Integrated (10/18/2023)   Social Connection and Isolation Panel [NHANES]    Frequency of Communication with Friends and Family: Never    Frequency of Social Gatherings with Friends and Family: More than three times a week    Attends Religious Services: More than 4 times per year    Active Member of [employer] or Organizations: Yes    Attends Engineer, structural: More than 4 times per year    Marital Status: Married    Review of  Systems: Gen: Denies fever, chills, anorexia. Denies fatigue, weakness, weight loss.  CV: Denies chest pain, palpitations, syncope, peripheral edema, and claudication. Resp: Denies dyspnea at rest, cough, wheezing, coughing up blood, and pleurisy. GI: Denies vomiting blood, jaundice, and fecal incontinence.   Denies dysphagia or odynophagia. Derm: Denies rash, itching, dry skin Psych: Denies depression, anxiety, memory loss, confusion. No homicidal or suicidal ideation.  Heme: Denies bruising, bleeding, and enlarged lymph nodes.  Physical Exam: There were no vitals taken for this visit. General:   Alert and oriented. No distress noted. Pleasant and cooperative.  Head:  Normocephalic and atraumatic. Eyes:  Conjuctiva clear without scleral icterus. Heart:  S1, S2 present without murmurs appreciated. Lungs:  Clear to auscultation bilaterally. No wheezes, rales, or rhonchi. No distress.  Abdomen:  +BS, soft, non-tender and non-distended. No rebound or guarding. No HSM or masses noted. Msk:  Symmetrical without gross deformities. Normal posture. Extremities:  Without edema. Neurologic:  Alert and  oriented x4 Psych:  Normal mood and affect.    Assessment:     Plan:  ***   Ermalinda Memos, PA-C [facility] 11/03/2023

## 2023-11-03 ENCOUNTER — Inpatient Hospital Stay (HOSPITAL_COMMUNITY)
Admission: EM | Admit: 2023-11-03 | Discharge: 2023-11-08 | DRG: 441 | Disposition: A | Discharge status: Hospice | Attending: Family Medicine | Admitting: Family Medicine

## 2023-11-03 ENCOUNTER — Emergency Department (HOSPITAL_COMMUNITY)

## 2023-11-03 ENCOUNTER — Other Ambulatory Visit: Payer: Self-pay

## 2023-11-03 ENCOUNTER — Ambulatory Visit: Admitting: Gastroenterology

## 2023-11-03 ENCOUNTER — Inpatient Hospital Stay (HOSPITAL_COMMUNITY)

## 2023-11-03 ENCOUNTER — Encounter: Payer: Self-pay | Admitting: Gastroenterology

## 2023-11-03 ENCOUNTER — Encounter (HOSPITAL_COMMUNITY): Payer: Self-pay | Admitting: *Deleted

## 2023-11-03 VITALS — BP 138/77 | HR 94 | Temp 97.9°F | Ht 70.0 in | Wt 175.2 lb

## 2023-11-03 DIAGNOSIS — D696 Thrombocytopenia, unspecified: Secondary | ICD-10-CM | POA: Diagnosis not present

## 2023-11-03 DIAGNOSIS — N400 Enlarged prostate without lower urinary tract symptoms: Secondary | ICD-10-CM | POA: Diagnosis present

## 2023-11-03 DIAGNOSIS — E8809 Other disorders of plasma-protein metabolism, not elsewhere classified: Secondary | ICD-10-CM | POA: Diagnosis present

## 2023-11-03 DIAGNOSIS — K7200 Acute and subacute hepatic failure without coma: Secondary | ICD-10-CM | POA: Diagnosis not present

## 2023-11-03 DIAGNOSIS — R600 Localized edema: Secondary | ICD-10-CM | POA: Diagnosis not present

## 2023-11-03 DIAGNOSIS — K767 Hepatorenal syndrome: Secondary | ICD-10-CM | POA: Diagnosis not present

## 2023-11-03 DIAGNOSIS — Z85828 Personal history of other malignant neoplasm of skin: Secondary | ICD-10-CM

## 2023-11-03 DIAGNOSIS — K754 Autoimmune hepatitis: Secondary | ICD-10-CM | POA: Diagnosis present

## 2023-11-03 DIAGNOSIS — E875 Hyperkalemia: Secondary | ICD-10-CM | POA: Diagnosis not present

## 2023-11-03 DIAGNOSIS — Z8673 Personal history of transient ischemic attack (TIA), and cerebral infarction without residual deficits: Secondary | ICD-10-CM

## 2023-11-03 DIAGNOSIS — Z933 Colostomy status: Secondary | ICD-10-CM

## 2023-11-03 DIAGNOSIS — D649 Anemia, unspecified: Secondary | ICD-10-CM | POA: Diagnosis present

## 2023-11-03 DIAGNOSIS — H4901 Third [oculomotor] nerve palsy, right eye: Secondary | ICD-10-CM | POA: Diagnosis not present

## 2023-11-03 DIAGNOSIS — D689 Coagulation defect, unspecified: Secondary | ICD-10-CM

## 2023-11-03 DIAGNOSIS — I4892 Unspecified atrial flutter: Secondary | ICD-10-CM | POA: Diagnosis not present

## 2023-11-03 DIAGNOSIS — N1831 Chronic kidney disease, stage 3a: Secondary | ICD-10-CM | POA: Diagnosis not present

## 2023-11-03 DIAGNOSIS — E860 Dehydration: Secondary | ICD-10-CM | POA: Diagnosis not present

## 2023-11-03 DIAGNOSIS — K7210 Chronic hepatic failure without coma: Secondary | ICD-10-CM | POA: Diagnosis present

## 2023-11-03 DIAGNOSIS — I8500 Esophageal varices without bleeding: Secondary | ICD-10-CM

## 2023-11-03 DIAGNOSIS — K7290 Hepatic failure, unspecified without coma: Secondary | ICD-10-CM | POA: Diagnosis present

## 2023-11-03 DIAGNOSIS — N179 Acute kidney failure, unspecified: Secondary | ICD-10-CM | POA: Diagnosis present

## 2023-11-03 DIAGNOSIS — Z86711 Personal history of pulmonary embolism: Secondary | ICD-10-CM

## 2023-11-03 DIAGNOSIS — Z7401 Bed confinement status: Secondary | ICD-10-CM | POA: Diagnosis not present

## 2023-11-03 DIAGNOSIS — K7460 Unspecified cirrhosis of liver: Secondary | ICD-10-CM | POA: Diagnosis not present

## 2023-11-03 DIAGNOSIS — E1122 Type 2 diabetes mellitus with diabetic chronic kidney disease: Secondary | ICD-10-CM | POA: Diagnosis present

## 2023-11-03 DIAGNOSIS — I8510 Secondary esophageal varices without bleeding: Secondary | ICD-10-CM | POA: Diagnosis present

## 2023-11-03 DIAGNOSIS — D693 Immune thrombocytopenic purpura: Secondary | ICD-10-CM | POA: Diagnosis present

## 2023-11-03 DIAGNOSIS — R188 Other ascites: Secondary | ICD-10-CM | POA: Diagnosis not present

## 2023-11-03 DIAGNOSIS — Z79890 Hormone replacement therapy (postmenopausal): Secondary | ICD-10-CM

## 2023-11-03 DIAGNOSIS — B179 Acute viral hepatitis, unspecified: Secondary | ICD-10-CM | POA: Diagnosis not present

## 2023-11-03 DIAGNOSIS — R17 Unspecified jaundice: Secondary | ICD-10-CM

## 2023-11-03 DIAGNOSIS — R7989 Other specified abnormal findings of blood chemistry: Secondary | ICD-10-CM | POA: Diagnosis present

## 2023-11-03 DIAGNOSIS — R7401 Elevation of levels of liver transaminase levels: Secondary | ICD-10-CM | POA: Diagnosis present

## 2023-11-03 DIAGNOSIS — Z79899 Other long term (current) drug therapy: Secondary | ICD-10-CM

## 2023-11-03 DIAGNOSIS — Z66 Do not resuscitate: Secondary | ICD-10-CM | POA: Diagnosis present

## 2023-11-03 DIAGNOSIS — Z7189 Other specified counseling: Secondary | ICD-10-CM | POA: Diagnosis not present

## 2023-11-03 DIAGNOSIS — I959 Hypotension, unspecified: Secondary | ICD-10-CM | POA: Diagnosis present

## 2023-11-03 DIAGNOSIS — Z932 Ileostomy status: Secondary | ICD-10-CM

## 2023-11-03 DIAGNOSIS — E119 Type 2 diabetes mellitus without complications: Secondary | ICD-10-CM | POA: Diagnosis not present

## 2023-11-03 DIAGNOSIS — Z1152 Encounter for screening for COVID-19: Secondary | ICD-10-CM

## 2023-11-03 DIAGNOSIS — I2609 Other pulmonary embolism with acute cor pulmonale: Secondary | ICD-10-CM | POA: Diagnosis not present

## 2023-11-03 DIAGNOSIS — K7689 Other specified diseases of liver: Secondary | ICD-10-CM | POA: Diagnosis not present

## 2023-11-03 DIAGNOSIS — J90 Pleural effusion, not elsewhere classified: Secondary | ICD-10-CM | POA: Diagnosis not present

## 2023-11-03 DIAGNOSIS — J9811 Atelectasis: Secondary | ICD-10-CM | POA: Diagnosis not present

## 2023-11-03 DIAGNOSIS — K719 Toxic liver disease, unspecified: Secondary | ICD-10-CM | POA: Diagnosis present

## 2023-11-03 DIAGNOSIS — Z8249 Family history of ischemic heart disease and other diseases of the circulatory system: Secondary | ICD-10-CM

## 2023-11-03 DIAGNOSIS — Z85038 Personal history of other malignant neoplasm of large intestine: Secondary | ICD-10-CM | POA: Diagnosis not present

## 2023-11-03 DIAGNOSIS — Z515 Encounter for palliative care: Secondary | ICD-10-CM

## 2023-11-03 DIAGNOSIS — E039 Hypothyroidism, unspecified: Secondary | ICD-10-CM | POA: Diagnosis not present

## 2023-11-03 DIAGNOSIS — E538 Deficiency of other specified B group vitamins: Secondary | ICD-10-CM | POA: Diagnosis not present

## 2023-11-03 DIAGNOSIS — K766 Portal hypertension: Secondary | ICD-10-CM | POA: Diagnosis not present

## 2023-11-03 DIAGNOSIS — R1011 Right upper quadrant pain: Secondary | ICD-10-CM | POA: Diagnosis not present

## 2023-11-03 DIAGNOSIS — R5383 Other fatigue: Secondary | ICD-10-CM | POA: Diagnosis not present

## 2023-11-03 DIAGNOSIS — E871 Hypo-osmolality and hyponatremia: Secondary | ICD-10-CM | POA: Diagnosis not present

## 2023-11-03 DIAGNOSIS — M549 Dorsalgia, unspecified: Secondary | ICD-10-CM | POA: Diagnosis present

## 2023-11-03 DIAGNOSIS — Z8042 Family history of malignant neoplasm of prostate: Secondary | ICD-10-CM

## 2023-11-03 DIAGNOSIS — I4891 Unspecified atrial fibrillation: Secondary | ICD-10-CM | POA: Diagnosis not present

## 2023-11-03 DIAGNOSIS — Z87891 Personal history of nicotine dependence: Secondary | ICD-10-CM

## 2023-11-03 DIAGNOSIS — R54 Age-related physical debility: Secondary | ICD-10-CM | POA: Diagnosis present

## 2023-11-03 DIAGNOSIS — Z7984 Long term (current) use of oral hypoglycemic drugs: Secondary | ICD-10-CM

## 2023-11-03 LAB — HEPATIC FUNCTION PANEL
ALT: 90 U/L — ABNORMAL HIGH (ref 0–44)
AST: 126 U/L — ABNORMAL HIGH (ref 15–41)
Albumin: 1.6 g/dL — ABNORMAL LOW (ref 3.5–5.0)
Alkaline Phosphatase: 130 U/L — ABNORMAL HIGH (ref 38–126)
Bilirubin, Direct: 15.3 mg/dL — ABNORMAL HIGH (ref 0.0–0.2)
Indirect Bilirubin: 12 mg/dL — ABNORMAL HIGH (ref 0.3–0.9)
Total Bilirubin: 27.3 mg/dL (ref 0.0–1.2)
Total Protein: 6.5 g/dL (ref 6.5–8.1)

## 2023-11-03 LAB — PROTIME-INR
INR: 2.8 — ABNORMAL HIGH (ref 0.8–1.2)
Prothrombin Time: 30 s — ABNORMAL HIGH (ref 11.4–15.2)

## 2023-11-03 LAB — URINALYSIS, ROUTINE W REFLEX MICROSCOPIC
Glucose, UA: 50 mg/dL — AB
Ketones, ur: NEGATIVE mg/dL
Leukocytes,Ua: NEGATIVE
Nitrite: NEGATIVE
Protein, ur: NEGATIVE mg/dL
Specific Gravity, Urine: 1.017 (ref 1.005–1.030)
pH: 5 (ref 5.0–8.0)

## 2023-11-03 LAB — CBC WITH DIFFERENTIAL/PLATELET
Abs Immature Granulocytes: 0.3 10*3/uL — ABNORMAL HIGH (ref 0.00–0.07)
Basophils Absolute: 0.1 10*3/uL (ref 0.0–0.1)
Basophils Relative: 0 %
Eosinophils Absolute: 0 10*3/uL (ref 0.0–0.5)
Eosinophils Relative: 0 %
HCT: 33 % — ABNORMAL LOW (ref 39.0–52.0)
Hemoglobin: 11.6 g/dL — ABNORMAL LOW (ref 13.0–17.0)
Immature Granulocytes: 2 %
Lymphocytes Relative: 3 %
Lymphs Abs: 0.4 10*3/uL — ABNORMAL LOW (ref 0.7–4.0)
MCH: 37.9 pg — ABNORMAL HIGH (ref 26.0–34.0)
MCHC: 35.2 g/dL (ref 30.0–36.0)
MCV: 107.8 fL — ABNORMAL HIGH (ref 80.0–100.0)
Monocytes Absolute: 1.2 10*3/uL — ABNORMAL HIGH (ref 0.1–1.0)
Monocytes Relative: 8 %
Neutro Abs: 13 10*3/uL — ABNORMAL HIGH (ref 1.7–7.7)
Neutrophils Relative %: 87 %
Platelets: 79 10*3/uL — ABNORMAL LOW (ref 150–400)
RBC: 3.06 MIL/uL — ABNORMAL LOW (ref 4.22–5.81)
RDW: 16.4 % — ABNORMAL HIGH (ref 11.5–15.5)
Smear Review: DECREASED
WBC: 15 10*3/uL — ABNORMAL HIGH (ref 4.0–10.5)
nRBC: 0 % (ref 0.0–0.2)

## 2023-11-03 LAB — COMPREHENSIVE METABOLIC PANEL WITH GFR
ALT: 96 U/L — ABNORMAL HIGH (ref 0–44)
AST: 137 U/L — ABNORMAL HIGH (ref 15–41)
Albumin: 1.8 g/dL — ABNORMAL LOW (ref 3.5–5.0)
Alkaline Phosphatase: 141 U/L — ABNORMAL HIGH (ref 38–126)
Anion gap: 8 (ref 5–15)
BUN: 35 mg/dL — ABNORMAL HIGH (ref 8–23)
CO2: 20 mmol/L — ABNORMAL LOW (ref 22–32)
Calcium: 8.8 mg/dL — ABNORMAL LOW (ref 8.9–10.3)
Chloride: 94 mmol/L — ABNORMAL LOW (ref 98–111)
Creatinine, Ser: 1.89 mg/dL — ABNORMAL HIGH (ref 0.61–1.24)
GFR, Estimated: 35 mL/min — ABNORMAL LOW (ref >60)
Glucose, Bld: 155 mg/dL — ABNORMAL HIGH (ref 70–99)
Potassium: 5.2 mmol/L — ABNORMAL HIGH (ref 3.5–5.1)
Sodium: 122 mmol/L — ABNORMAL LOW (ref 135–145)
Total Bilirubin: 29.3 mg/dL (ref 0.0–1.2)
Total Protein: 7.2 g/dL (ref 6.5–8.1)

## 2023-11-03 LAB — AMMONIA: Ammonia: <10 umol/L (ref 9–35)

## 2023-11-03 LAB — BRAIN NATRIURETIC PEPTIDE: B Natriuretic Peptide: 187 pg/mL — ABNORMAL HIGH (ref 0.0–100.0)

## 2023-11-03 LAB — IRON AND TIBC
Iron: 61 ug/dL (ref 45–182)
Saturation Ratios: 61 % — ABNORMAL HIGH (ref 17.9–39.5)
TIBC: 101 ug/dL — ABNORMAL LOW (ref 250–450)
UIBC: 40 ug/dL

## 2023-11-03 LAB — HEPATITIS PANEL, ACUTE
HCV Ab: NONREACTIVE
Hep A IgM: NONREACTIVE
Hep B C IgM: NONREACTIVE
Hepatitis B Surface Ag: NONREACTIVE

## 2023-11-03 LAB — LIPASE, BLOOD: Lipase: 32 U/L (ref 11–51)

## 2023-11-03 LAB — SODIUM, URINE, RANDOM: Sodium, Ur: 16 mmol/L

## 2023-11-03 LAB — FERRITIN: Ferritin: 938 ng/mL — ABNORMAL HIGH (ref 24–336)

## 2023-11-03 MED ORDER — HYDRALAZINE HCL 20 MG/ML IJ SOLN: 5.0000 mg | INTRAMUSCULAR | Status: DC | PRN

## 2023-11-03 MED ORDER — SODIUM CHLORIDE 0.9 % IV SOLN: INTRAVENOUS | Status: DC

## 2023-11-03 MED ORDER — PHYTONADIONE 5 MG PO TABS
5.0000 mg | ORAL_TABLET | Freq: Once | ORAL | Status: AC
Start: 2023-11-03 — End: 2023-11-03
  Administered 2023-11-03: 5 mg via ORAL
  Filled 2023-11-03: qty 1

## 2023-11-03 MED ORDER — ALBUMIN HUMAN 25 % IV SOLN
25.0000 g | Freq: Four times a day (QID) | INTRAVENOUS | Status: DC
  Administered 2023-11-03 – 2023-11-05 (×9): 25 g via INTRAVENOUS
  Administered 2023-11-06: 12.5 g via INTRAVENOUS
  Administered 2023-11-06 (×2): 25 g via INTRAVENOUS
  Administered 2023-11-06: 12.5 g via INTRAVENOUS
  Administered 2023-11-07 – 2023-11-08 (×6): 25 g via INTRAVENOUS
  Filled 2023-11-03 (×19): qty 100

## 2023-11-03 MED ORDER — OCTREOTIDE ACETATE 100 MCG/ML IJ SOLN
100.0000 ug | Freq: Three times a day (TID) | INTRAMUSCULAR | Status: DC
  Administered 2023-11-03 – 2023-11-04 (×3): 100 ug via SUBCUTANEOUS
  Filled 2023-11-03 (×2): qty 2
  Filled 2023-11-03 (×4): qty 1
  Filled 2023-11-03: qty 2

## 2023-11-03 MED ORDER — SODIUM CHLORIDE 0.9 % IV BOLUS
1000.0000 mL | Freq: Once | INTRAVENOUS | Status: AC
  Administered 2023-11-03: 1000 mL via INTRAVENOUS

## 2023-11-03 MED ORDER — OCTREOTIDE ACETATE 100 MCG/ML IJ SOLN
INTRAMUSCULAR | Status: AC
  Filled 2023-11-03: qty 1

## 2023-11-03 MED ORDER — MIDODRINE HCL 5 MG PO TABS
2.5000 mg | ORAL_TABLET | Freq: Three times a day (TID) | ORAL | Status: DC
  Administered 2023-11-03 – 2023-11-04 (×3): 2.5 mg via ORAL
  Filled 2023-11-03 (×3): qty 1

## 2023-11-03 MED ORDER — SODIUM ZIRCONIUM CYCLOSILICATE 5 G PO PACK
5.0000 g | PACK | Freq: Once | ORAL | Status: AC
Start: 2023-11-03 — End: 2023-11-03
  Administered 2023-11-03: 5 g via ORAL
  Filled 2023-11-03: qty 1

## 2023-11-03 MED ORDER — ALBUMIN HUMAN 25 % IV SOLN
80.0000 g | Freq: Once | INTRAVENOUS | Status: DC
  Filled 2023-11-03: qty 350

## 2023-11-03 MED ORDER — RISAQUAD PO CAPS
1.0000 | ORAL_CAPSULE | Freq: Every day | ORAL | Status: DC
  Administered 2023-11-04 – 2023-11-08 (×5): 1 via ORAL
  Filled 2023-11-03 (×5): qty 1

## 2023-11-03 MED ORDER — TAMSULOSIN HCL 0.4 MG PO CAPS
0.4000 mg | ORAL_CAPSULE | Freq: Every day | ORAL | Status: DC
  Administered 2023-11-03 – 2023-11-07 (×5): 0.4 mg via ORAL
  Filled 2023-11-03 (×5): qty 1

## 2023-11-03 MED ORDER — LEVOTHYROXINE SODIUM 50 MCG PO TABS
50.0000 ug | ORAL_TABLET | Freq: Every day | ORAL | Status: DC
  Administered 2023-11-04 – 2023-11-08 (×5): 50 ug via ORAL
  Filled 2023-11-03 (×5): qty 1

## 2023-11-03 NOTE — H&P (Signed)
 [facility] H&P   Patient Demographics:    Jose Lawrence, is a 83 y.o. male  MRN: [id number]   DOB - [birth year]  Admit Date - 11/03/2023  Outpatient Primary MD for the patient is Ignatius Specking, MD  Referring MD/NP/PA: Dr Rhae Hammock  Outpatient Specialists: GI Dr. Levon Hedger  Patient coming from: GI office  Chief Complaint  Patient presents with   Weakness      HPI:    Jose Lawrence  is a 83 y.o. male, Jose Lawrence is a 83 y.o. male with medical history significant of pulmonary embolism, type 2 diabetes mellitus, BPH, hypothyroidism, colon ca sp resection/colostomy ( follows Dr Ellin Saba) and liver cirrhosis, prior strep agalactiae bacteremia in 05/2023, finished IV daptomycin 10/07/2023, recent hospitalization 3/10 until 3/11, for painless jaundice, CT and MRCP without obstructive or other acute biliary abnormality.. -Patient presents to ED today he was sent from GI office given secondary to complaints for generalized weakness, poor appetite, cannot eat, lethargic sleeping all day and skin has been more jaundiced.  Reports he has been holding on his walks because of his weakness, reports he is feeling tired all the time, has no appetite, did once last week, denies any chest pain, coffee-ground emesis, no blood with stools. -In ED his workup significant for sodium 122, potassium 5.2, chloride 94, glucose 155, creatinine is up to 1.89 from 0.85, albumin low at 1.8, alk phos elevated at 141, total bilirubin significantly elevated at 29.3, was 10.4 on 3/11, white blood cell count elevated at 15, hemoglobin at 11.6, from baseline 9.1 3/11, platelet count low at 79K, INR is pending, ultrasound is pending, [facility] hospitalist consulted to admit.   Review of systems:      A full 10 point Review of Systems was done, except as stated above, all other Review of Systems were negative.   With Past  History of the following :    Past Medical History:  Diagnosis Date   Anemia    Causing interruption in anticoagulation   Atrial fibrillation and flutter (HCC)    BPH (benign prostatic hypertrophy)    Cirrhosis (HCC)    Colon cancer (HCC)    Status post LAR 2008   Dysrhythmia    Hypothyroidism    Lacunar infarction (HCC) 11/2012   RT 3rd NERVE PALSY, SB Dr. Lita Mains   PE (pulmonary embolism)    Temporarily on Eliquis 2017   Skin cancer    35 years ago   Type 2 diabetes mellitus (HCC)    Vitamin B 12 deficiency 07/10/2019      Past Surgical History:  Procedure Laterality Date   ABDOMINOPERINEAL PROCTOCOLECTOMY  2009   end colostomy    BIOPSY  08/25/2018   Procedure: BIOPSY;  Surgeon: Malissa Hippo, MD;  [facility];  Service: Endoscopy;;   BIOPSY  10/04/2021   Procedure: BIOPSY;  Surgeon: Malissa Hippo,  MD;  [facility];  Service: Endoscopy;;  small, bowel   BLADDER REPAIR  08/29/2018   Procedure: BLADDER REPAIR;  Surgeon: Lucretia Roers, MD;  Location: [facility];  Service: General;;   COLECTOMY  2008   COLONOSCOPY WITH PROPOFOL N/A 08/25/2018   Procedure: COLONOSCOPY WITH PROPOFOL;  Surgeon: Malissa Hippo, MD;  [facility];  Service: Endoscopy;  Laterality: N/A;  2:20   COLOSTOMY     ESOPHAGOGASTRODUODENOSCOPY (EGD) WITH PROPOFOL N/A 05/10/2023   Procedure: ESOPHAGOGASTRODUODENOSCOPY (EGD) WITH PROPOFOL;  Surgeon: Dolores Frame, MD;  [facility];  Service: Gastroenterology;  Laterality: N/A;  11:00AM;ASA 3   EXPLORATORY LAPAROTOMY  2017   ? pneumoperitoneum of unknown etiology/ negative Ex lap   GIVENS CAPSULE STUDY  10/04/2021   Procedure: GIVENS CAPSULE STUDY;  Surgeon: Malissa Hippo, MD;  [facility];  Service: Endoscopy;;   ILEOSCOPY N/A 10/04/2021   Procedure: ILEOSCOPY THROUGH STOMA;  Surgeon: Malissa Hippo, MD;  [facility];  Service: Endoscopy;  Laterality: N/A;   ILEOSTOMY   08/29/2018   Procedure: ILEOSTOMY;  Surgeon: Lucretia Roers, MD;  [facility];  Service: General;;   LAPAROSCOPIC CHOLECYSTECTOMY  2007   LYSIS OF ADHESION  08/29/2018   Procedure: LYSIS OF ADHESION;  Surgeon: Lucretia Roers, MD;  [facility];  Service: General;;   PARTIAL COLECTOMY N/A 08/29/2018   Procedure: PARTIAL COLECTOMY;  Surgeon: Lucretia Roers, MD;  [facility];  Service: General;  Laterality: N/A;   PORT-A-CATH REMOVAL Left 09/23/2023   Procedure: MINOR REMOVAL PORT-A-CATH;  Surgeon: Marjo Bicker, MD;  Location[facility];  Service: Cardiovascular;  Laterality: Left;   PORTACATH PLACEMENT Left 10/04/2018   Procedure: INSERTION PORT-A-CATH (attached catheter left subclavian);  Surgeon: Lucretia Roers, MD;  [facility];  Service: General;  Laterality: Left;   SBO SURGERY  06/2009   TEE WITHOUT CARDIOVERSION N/A 09/23/2023   Procedure: TRANSESOPHAGEAL ECHOCARDIOGRAM (TEE);  Surgeon: Marjo Bicker, MD;  Location[facility];  Service: Cardiovascular;  Laterality: N/A;      Social History:     Social History   Tobacco Use   Smoking status: Former    Current packs/day: 0.00    Average packs/day: 1 pack/day for 27.0 years (27.0 ttl pk-yrs)    Types: Cigarettes    Start date: 01/27/1964    Quit date: 01/27/1991    Years since quitting: 32.7   Smokeless tobacco: Former    Types: Chew    Quit date: 08/10/1995  Substance Use Topics   Alcohol use: Not Currently        Family History :     Family History  Problem Relation Age of Onset   Heart disease Father    Heart failure Father    Heart attack Father    Prostate cancer Paternal Uncle       Home Medications:   Prior to Admission medications   Medication Sig Start Date End Date Taking? Authorizing Provider  acidophilus (RISAQUAD) CAPS capsule Take 1 capsule by mouth daily.    [provider]  carvedilol (COREG) 3.125 MG tablet Take 3.125 mg by mouth 2 (two) times  daily. 11/01/23   [provider]  furosemide (LASIX) 20 MG tablet Take 1 tablet (20 mg total) by mouth daily. 10/18/23 10/17/24  Dorcas Carrow, MD  glipiZIDE (GLUCOTROL) 5 MG tablet Take 10 mg by mouth every morning. 10/29/23   [provider]  lactose free nutrition (BOOST) LIQD Take 237 mLs by mouth daily at 6 (six) AM.    [provider]  levothyroxine (SYNTHROID, LEVOTHROID) 50 MCG tablet Take 50 mcg by mouth daily before breakfast.    [provider]  loperamide (IMODIUM) 2 MG capsule Take 2 capsules (4 mg total) by mouth 4 (four) times daily -  before meals and at bedtime. 11/11/18   Lucretia Roers, MD  potassium chloride (KLOR-CON) 10 MEQ tablet Take 1 tablet (10 mEq total) by mouth daily as needed. Take when you take Lasix/Furosemide 06/10/23   Shon Hale, MD  spironolactone (ALDACTONE) 50 MG tablet Take 1 tablet (50 mg total) by mouth daily. 10/18/23 10/17/24  Dorcas Carrow, MD  tamsulosin (FLOMAX) 0.4 MG CAPS capsule Take 1 capsule (0.4 mg total) by mouth daily after supper. 06/10/23   Shon Hale, MD  Vitamin D, Ergocalciferol, (DRISDOL) 1.25 MG (50000 UNIT) CAPS capsule Take 1 capsule by mouth once a week 09/05/23   Doreatha Massed, MD     Allergies:    No Known Allergies   Physical Exam:   Vitals  Blood pressure (!) 127/54, pulse 74, temperature 97.9 F (36.6 C), temperature source Temporal, resp. rate 15, height 5\' 10"  (1.778 m), weight 79.5 kg, SpO2 100%.   1. General Pale, chronically appearing male, laying in bed in no apparent distress, he is extremely jaundiced  2. Normal affect and insight, Not Suicidal or Homicidal, Awake Alert, Oriented X 3.  3. No F.N deficits, ALL C.Nerves Intact, Strength 5/5 all 4 extremities, Sensation intact all 4 extremities, Plantars down going.  4. Ears and Eyes appear Normal, icteric sclera, PERRLA.  Dry oral Mucosa.  5. Supple Neck, No JVD, No cervical lymphadenopathy appriciated, No  Carotid Bruits.  6. Symmetrical Chest wall movement, Good air movement bilaterally, CTAB.  7. RRR, No Gallops, Rubs or Murmurs, No Parasternal Heave.  8. Positive Bowel Sounds, Abdomen Soft, ostomy present, no tenderness, No organomegaly appriciated,No rebound -guarding or rigidity.  9.  No Cyanosis,slow l Skin Turgor, No Skin Rash or Bruise.  10. Good muscle tone,  joints appear normal , no effusions, Normal ROM.     Data Review:    CBC Recent Labs  Lab 11/03/23 1302  WBC 15.0*  HGB 11.6*  HCT 33.0*  PLT 79*  MCV 107.8*  MCH 37.9*  MCHC 35.2  RDW 16.4*  LYMPHSABS 0.4*  MONOABS 1.2*  EOSABS 0.0  BASOSABS 0.1   ------------------------------------------------------------------------------------------------------------------  Chemistries  Recent Labs  Lab 11/03/23 1302  NA 122*  K 5.2*  CL 94*  CO2 20*  GLUCOSE 155*  BUN 35*  CREATININE 1.89*  CALCIUM 8.8*  AST 137*  ALT 96*  ALKPHOS 141*  BILITOT 29.3*   ------------------------------------------------------------------------------------------------------------------ estimated creatinine clearance is 32.2 mL/min (A) (by C-G formula based on SCr of 1.89 mg/dL (H)). ------------------------------------------------------------------------------------------------------------------ No results for input(s): "TSH", "T4TOTAL", "T3FREE", "THYROIDAB" in the last 72 hours.  Invalid input(s): "FREET3"  Coagulation profile No results for input(s): "INR", "PROTIME" in the last 168 hours. ------------------------------------------------------------------------------------------------------------------- No results for input(s): "DDIMER" in the last 72 hours. -------------------------------------------------------------------------------------------------------------------  Cardiac Enzymes No results for input(s): "CKMB", "TROPONINI", "MYOGLOBIN" in the last 168 hours.  Invalid input(s):  "CK" ------------------------------------------------------------------------------------------------------------------    Component Value Date/Time   BNP 163.0 (H) 10/17/2023 1418     ---------------------------------------------------------------------------------------------------------------  Urinalysis    Component Value Date/Time   COLORURINE AMBER (A) 10/17/2023 1455   APPEARANCEUR CLEAR 10/17/2023 1455   APPEARANCEUR Clear 08/30/2023  1409   LABSPEC 1.015 10/17/2023 1455   PHURINE 5.0 10/17/2023 1455   GLUCOSEU 50 (A) 10/17/2023 1455   HGBUR NEGATIVE 10/17/2023 1455   BILIRUBINUR SMALL (A) 10/17/2023 1455   BILIRUBINUR Negative 08/30/2023 1409   KETONESUR NEGATIVE 10/17/2023 1455   PROTEINUR NEGATIVE 10/17/2023 1455   NITRITE NEGATIVE 10/17/2023 1455   LEUKOCYTESUR NEGATIVE 10/17/2023 1455    ----------------------------------------------------------------------------------------------------------------   Imaging Results:    DG Chest Portable 1 View Result Date: 11/03/2023 CLINICAL DATA:  Fatigue EXAM: PORTABLE CHEST 1 VIEW COMPARISON:  10/17/2023 FINDINGS: Stable cardiomediastinal contours. Decreased bilateral pleural effusions with small residual effusion on the right. Mild right basilar atelectasis. No pneumothorax. IMPRESSION: Decreased bilateral pleural effusions with small residual effusion on the right. Electronically Signed   By: Duanne Guess D.O.   On: 11/03/2023 14:17      Assessment & Plan:    Principal Problem:   Liver failure (HCC) Active Problems:   Cirrhosis of liver (HCC)   Acquired hypothyroidism   BPH (benign prostatic hyperplasia)   Acute renal failure 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)   Elevated LFTs   Autoimmune hepatitis (HCC)   Jaundice   Chronic idiopathic thrombocytopenia (HCC)   Decompensated liver failure Transaminitis Coagulopathy Hypoalbuminemia Hyperbilirubinemia/jaundice -GI salted by ED, will await  recommendations. -Ammonia within normal limit, no indication for lactulose -He is to be volume depleted, will hold on Aldactone and Lasix -On Coreg for now till HRS is ruled out -Give 1 dose of vitamin K given coagulopathy -Upper quadrant ultrasound is pending -Avoid hepatotoxic medications -Out infectious process contributing to worsening liver failure, will check UA, blood cultures.  AKI and CKD stage III unspecified Concern for hepatorenal syndrome -Patient appears to be clinically dehydration, continue with the fluid challenge -UA is pending -Hold Aldactone and Lasix. -Hold Coreg till HRS is ruled out. -I will start empirically on albumin, octreotide and Duradrin -If no improvement of renal function in a.m. will consult renal  Hyponatremia -Most likely due to volume depletion and dehydration -Lasix and Aldactone -Check urine sodium -Continue with IV normal saline  Hyperkalemia -will give Lokelma, hold Aldactone  Thrombocytopenia -Lower than baseline, SCD for DVT prophylaxis  Diabetes mellitus, type II -Hold oral agent and keep an insulin sliding scale  Hypothyroidism -Continue Synthroid    DVT Prophylaxis SCDs   AM Labs Ordered, also please review Full Orders  Family Communication: Admission, patients condition and plan of care including tests being ordered have been discussed with the patient and wife at bedside* who indicate understanding and agree with the plan and Code Status.  Code Status full code  Likely DC to home  Consults called: GI by ED  Admission status: Inpatient  Time spent in minutes : 75 minutes   Huey Bienenstock M.D on 11/03/2023 at 4:09 PM   [facility] - Office  [phone]

## 2023-11-03 NOTE — TOC Initial Note (Signed)
 Transition of Care [facility]) - Initial/Assessment Note    Patient Details  Name: Jose Lawrence MRN: [id number] Date of Birth: 08-12-1940  Transition of Care [facility]) CM/SW Contact:    Barron Alvine, RN Phone Number: 11/03/2023, 8:46 PM  Clinical Narrative:                 Transition of Care Department [facility]) has reviewed patient and no other TOC needs have been identified at this time. We will continue to monitor patient advancement through interdisciplinary progression rounds. If new patient needs arise, please place a TOC consult.   Expected Discharge Plan: Home/Self Care Barriers to Discharge: Continued Medical Work up   Patient Goals and CMS Choice Patient states their goals for this hospitalization and ongoing recovery are:: To get home.          Expected Discharge Plan and Services In-house Referral: Clinical Social Work Discharge Planning Services: CM Consult   Living arrangements for the past 2 months: Single Family Home                                      Prior Living Arrangements/Services Living arrangements for the past 2 months: Single Family Home Lives with:: Spouse Patient language and need for interpreter reviewed:: Yes Do you feel safe going back to the place where you live?: Yes      Need for Family Participation in Patient Care: Yes (Comment) Care giver support system in place?: Yes (comment)   Criminal Activity/Legal Involvement Pertinent to Current Situation/Hospitalization: No - Comment as needed  Activities of Daily Living   ADL Screening (condition at time of admission) Independently performs ADLs?: Yes (appropriate for developmental age) Is the patient deaf or have difficulty hearing?: No Does the patient have difficulty seeing, even when wearing glasses/contacts?: No Does the patient have difficulty concentrating, remembering, or making decisions?: No  Permission Sought/Granted                  Emotional Assessment    Attitude/Demeanor/Rapport: Engaged Affect (typically observed): Appropriate Orientation: : Oriented to Self, Oriented to Place, Oriented to  Time, Oriented to Situation Alcohol / Substance Use: Not Applicable Psych Involvement: No (comment)  Admission diagnosis:  Liver failure (HCC) [K72.90] AKI (acute kidney injury) (HCC) [N17.9] Acute liver failure without hepatic coma [K72.00] Patient Active Problem List   Diagnosis Date Noted   Liver failure (HCC) 11/03/2023   Jaundice 10/17/2023   Chronic idiopathic thrombocytopenia (HCC) 10/17/2023   Need for hepatitis B screening test 10/13/2023   Need for hepatitis C screening test 10/13/2023   Medication management 10/13/2023   Bacteremia due to Staphylococcus 09/23/2023   Vomiting 09/20/2023   Bilateral pleural effusion 09/20/2023   Hypoalbuminemia due to protein-calorie malnutrition (HCC) 09/20/2023   Acute febrile illness 09/19/2023   Bacteremia due to group B Streptococcus/Agalactiae 06/09/2023   UTI due to Klebsiella species 06/09/2023   Fever 06/08/2023   UTI (urinary tract infection) 06/08/2023   Hematuria 06/08/2023   Secondary esophageal varices without bleeding (HCC) 05/10/2023   Streptococcal bacteremia 04/23/2023   Urinary retention 04/22/2023   Cirrhosis of liver (HCC) 04/22/2023   Hypomagnesemia 04/22/2023   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   Cirrhosis of liver without ascites (HCC) 04/13/2023   Elevated LFTs 09/16/2022   Acute GI bleeding 10/03/2021   Transaminitis    Hypotension due to hypovolemia    Acute renal failure superimposed on stage  3a chronic kidney disease (HCC) 12/24/2020   Thrombocytopenia (HCC) 12/24/2020   Lactic acidosis 12/24/2020   Permanent atrial fibrillation (HCC) 12/24/2020   Type 2 diabetes mellitus with hyperglycemia (HCC) 12/24/2020   Elevated lactic acid level    Hyperkalemia    Hyponatremia    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   AKI (acute kidney injury)  (HCC) 12/23/2020   Lumbar spondylosis 08/26/2020   Body mass index (BMI) 27.0-27.9, adult 07/02/2020   Elevated blood-pressure reading, without diagnosis of hypertension 07/02/2020   Herniated nucleus pulposus, lumbar 06/30/2020   Spondylitis (HCC) 06/30/2020   Lumbar radiculopathy 06/23/2020   Spondylolisthesis at L5-S1 level 06/23/2020   Vitamin B 12 deficiency 07/10/2019   Macrocytic anemia 09/14/2018   Malaise and fatigue 09/14/2018   Indwelling Foley catheter present 09/14/2018   Intraoperative bladder injury 09/04/2018   Postoperative anemia due to acute blood loss 09/04/2018   Cancer of transverse colon (HCC)    Iron deficiency anemia due to chronic blood loss 08/25/2018   Acquired hypothyroidism 08/25/2018   HTN (hypertension) 08/25/2018   Controlled type 2 diabetes mellitus without complication (HCC) 08/25/2018   Colostomy in place [facility]) 08/25/2018   BPH (benign prostatic hyperplasia) 08/25/2018   Atrial fibrillation, chronic (HCC) 08/25/2018   Mass of colon 08/21/2018   S/P exploratory laparotomy 11/11/2015   Pneumoperitoneum 10/31/2015   Colon cancer (HCC) 01/12/2007   PCP:  Ignatius Specking, MD Pharmacy:   [id number] - [place], [place] - [address] [place] [address] Phone: [phone] Fax: [phone]     Social Drivers of Health (SDOH) Social History: SDOH Screenings   Food Insecurity: No Food Insecurity (11/03/2023)  Housing: Low Risk  (11/03/2023)  Transportation Needs: No Transportation Needs (11/03/2023)  Utilities: Not At Risk (11/03/2023)  Alcohol Screen: Low Risk  (09/01/2020)  Depression (PHQ2-9): Low Risk  (09/01/2020)  Financial Resource Strain: Low Risk  (09/01/2020)  Physical Activity: Sufficiently Active (09/01/2020)  Social Connections: Socially Integrated (11/03/2023)  Stress: No Stress Concern Present (09/01/2020)  Tobacco Use: Medium Risk (11/03/2023)   SDOH Interventions:     Readmission Risk  Interventions    11/03/2023    8:45 PM 10/18/2023   11:41 AM 09/26/2023   11:29 AM  Readmission Risk Prevention Plan  Transportation Screening Complete Complete   PCP or Specialist Appt within 5-7 Days   Complete  Home Care Screening   Complete  Medication Review (RN CM)   Complete  HRI or Home Care Consult  Complete   Social Work Consult for Recovery Care Planning/Counseling  Complete   Palliative Care Screening  Not Applicable   Medication Review [employer]) Complete Complete   PCP or Specialist appointment within 3-5 days of discharge Complete    HRI or Home Care Consult Complete    SW Recovery Care/Counseling Consult Complete    Palliative Care Screening Not Applicable    Skilled Nursing Facility Not Applicable

## 2023-11-03 NOTE — Patient Instructions (Signed)
 Please proceed to [facility].   Ermalinda Memos, PA-C [facility]

## 2023-11-03 NOTE — ED Provider Notes (Signed)
 [place] [facility] DEPARTMENT AT [facility] Provider Note   CSN: [id number] Arrival date & time: 11/03/23  1235     History  Chief Complaint  Patient presents with   Weakness    Jose Lawrence is a 83 y.o. male.  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  The patient states that over the past few weeks he has had decreased appetite and worsening generalized weakness.  The patient has been afebrile.  He did have 1 episode of nonbloody, nonbilious emesis this morning.  He went to see his gastroenterologist today and was sent to the ER for further evaluation.  He denies any fevers, chills, or cough.   Weakness      Home Medications Prior to Admission medications   Medication Sig Start Date End Date Taking? Authorizing Provider  acidophilus (RISAQUAD) CAPS capsule Take 1 capsule by mouth daily.   Yes [provider]  carvedilol (COREG) 3.125 MG tablet Take 3.125 mg by mouth 2 (two) times daily. 11/01/23  Yes [provider]  furosemide (LASIX) 20 MG tablet Take 1 tablet (20 mg total) by mouth daily. 10/18/23 10/17/24 Yes Dorcas Carrow, MD  lactose free nutrition (BOOST) LIQD Take 237 mLs by mouth daily at 6 (six) AM.   Yes [provider]  levothyroxine (SYNTHROID, LEVOTHROID) 50 MCG tablet Take 50 mcg by mouth daily before breakfast.   Yes [provider]  loperamide (IMODIUM) 2 MG capsule Take 2 capsules (4 mg total) by mouth 4 (four) times daily -  before meals and at bedtime. 11/11/18  Yes Lucretia Roers, MD  spironolactone (ALDACTONE) 50 MG tablet Take 1 tablet (50 mg total) by mouth daily. 10/18/23 10/17/24 Yes Dorcas Carrow, MD  tamsulosin (FLOMAX) 0.4 MG CAPS capsule Take 1 capsule (0.4 mg total) by mouth daily after supper. 06/10/23  Yes Emokpae, Courage, MD  Vitamin D, Ergocalciferol,  (DRISDOL) 1.25 MG (50000 UNIT) CAPS capsule Take 1 capsule by mouth once a week 09/05/23  Yes Doreatha Massed, MD      Allergies    Patient has no known allergies.    Review of Systems   Review of Systems  Neurological:  Positive for weakness.  All other systems reviewed and are negative.   Physical Exam Updated Vital Signs BP (!) 111/55 (BP Location: Right Arm)   Pulse 82   Temp 98.6 F (37 C) (Oral)   Resp 16   Ht 6' (1.829 m)   Wt 77.8 kg   SpO2 93%   BMI 23.26 kg/m  Physical Exam Vitals and nursing note reviewed.   Gen: NAD Eyes: PERRL, EOMI, + scleral icterus HEENT: no oropharyngeal swelling Neck: trachea midline Resp: clear to auscultation bilaterally Card: RRR, no murmurs, rubs, or gallops Abd: nontender, nondistended Extremities: no calf tenderness, no edema Vascular: 2+ radial pulses bilaterally, 2+ DP pulses bilaterally Skin: + jaundice    ED Results / Procedures / Treatments   Labs (all labs ordered are listed, but only abnormal results are displayed) Labs Reviewed  CBC WITH DIFFERENTIAL/PLATELET - Abnormal; Notable for the following components:      Result Value   WBC 15.0 (*)    RBC 3.06 (*)    Hemoglobin 11.6 (*)    HCT 33.0 (*)    MCV 107.8 (*)    MCH 37.9 (*)    RDW 16.4 (*)    Platelets 79 (*)  Neutro Abs 13.0 (*)    Lymphs Abs 0.4 (*)    Monocytes Absolute 1.2 (*)    Abs Immature Granulocytes 0.30 (*)    All other components within normal limits  COMPREHENSIVE METABOLIC PANEL WITH GFR - Abnormal; Notable for the following components:   Sodium 122 (*)    Potassium 5.2 (*)    Chloride 94 (*)    CO2 20 (*)    Glucose, Bld 155 (*)    BUN 35 (*)    Creatinine, Ser 1.89 (*)    Calcium 8.8 (*)    Albumin 1.8 (*)    AST 137 (*)    ALT 96 (*)    Alkaline Phosphatase 141 (*)    Total Bilirubin 29.3 (*)    GFR, Estimated 35 (*)    All other components within normal limits  URINALYSIS, ROUTINE W REFLEX MICROSCOPIC - Abnormal;  Notable for the following components:   Color, Urine AMBER (*)    APPearance HAZY (*)    Glucose, UA 50 (*)    Hgb urine dipstick SMALL (*)    Bilirubin Urine MODERATE (*)    Bacteria, UA RARE (*)    All other components within normal limits  FERRITIN - Abnormal; Notable for the following components:   Ferritin 938 (*)    All other components within normal limits  IRON AND TIBC - Abnormal; Notable for the following components:   TIBC 101 (*)    Saturation Ratios 61 (*)    All other components within normal limits  PROTIME-INR - Abnormal; Notable for the following components:   Prothrombin Time 30.0 (*)    INR 2.8 (*)    All other components within normal limits  HEPATIC FUNCTION PANEL - Abnormal; Notable for the following components:   Albumin 1.6 (*)    AST 126 (*)    ALT 90 (*)    Alkaline Phosphatase 130 (*)    Total Bilirubin 27.3 (*)    Bilirubin, Direct 15.3 (*)    Indirect Bilirubin 12.0 (*)    All other components within normal limits  BRAIN NATRIURETIC PEPTIDE - Abnormal; Notable for the following components:   B Natriuretic Peptide 187.0 (*)    All other components within normal limits  COMPREHENSIVE METABOLIC PANEL WITH GFR - Abnormal; Notable for the following components:   Sodium 126 (*)    Potassium 5.2 (*)    BUN 36 (*)    Creatinine, Ser 1.83 (*)    Calcium 8.5 (*)    Total Protein 5.7 (*)    Albumin 2.2 (*)    AST 104 (*)    ALT 73 (*)    Total Bilirubin 26.7 (*)    GFR, Estimated 37 (*)    Anion gap 3 (*)    All other components within normal limits  CBC - Abnormal; Notable for the following components:   RBC 2.28 (*)    Hemoglobin 8.6 (*)    HCT 24.5 (*)    MCV 107.5 (*)    MCH 37.7 (*)    RDW 16.1 (*)    Platelets 53 (*)    All other components within normal limits  PROTIME-INR - Abnormal; Notable for the following components:   Prothrombin Time 36.1 (*)    INR 3.6 (*)    All other components within normal limits  BODY FLUID CELL COUNT  WITH DIFFERENTIAL - Abnormal; Notable for the following components:   Color, Fluid AMBER (*)    Appearance, Fluid HAZY (*)  Neutrophil Count, Fluid 49 (*)    Monocyte-Macrophage-Serous Fluid 24 (*)    All other components within normal limits  COMPREHENSIVE METABOLIC PANEL WITH GFR - Abnormal; Notable for the following components:   Sodium 126 (*)    CO2 21 (*)    Glucose, Bld 120 (*)    BUN 32 (*)    Creatinine, Ser 1.53 (*)    Calcium 8.6 (*)    Total Protein 5.7 (*)    Albumin 2.5 (*)    AST 104 (*)    ALT 73 (*)    Total Bilirubin 29.4 (*)    GFR, Estimated 46 (*)    All other components within normal limits  CBC - Abnormal; Notable for the following components:   RBC 2.31 (*)    Hemoglobin 8.8 (*)    HCT 24.9 (*)    MCV 107.8 (*)    MCH 38.1 (*)    RDW 15.9 (*)    Platelets 52 (*)    All other components within normal limits  PROTIME-INR - Abnormal; Notable for the following components:   Prothrombin Time 32.5 (*)    INR 3.1 (*)    All other components within normal limits  COMPREHENSIVE METABOLIC PANEL WITH GFR - Abnormal; Notable for the following components:   Sodium 129 (*)    CO2 20 (*)    Glucose, Bld 132 (*)    BUN 34 (*)    Creatinine, Ser 1.50 (*)    Total Protein 5.7 (*)    Albumin 2.7 (*)    AST 92 (*)    ALT 70 (*)    Total Bilirubin 32.1 (*)    GFR, Estimated 47 (*)    All other components within normal limits  CBC - Abnormal; Notable for the following components:   WBC 11.8 (*)    RBC 2.31 (*)    Hemoglobin 8.8 (*)    HCT 25.0 (*)    MCV 108.2 (*)    MCH 38.1 (*)    RDW 15.9 (*)    Platelets 47 (*)    All other components within normal limits  PROTIME-INR - Abnormal; Notable for the following components:   Prothrombin Time 38.4 (*)    INR 3.9 (*)    All other components within normal limits  GLUCOSE, CAPILLARY - Abnormal; Notable for the following components:   Glucose-Capillary 149 (*)    All other components within normal limits   COMPREHENSIVE METABOLIC PANEL WITH GFR - Abnormal; Notable for the following components:   Sodium 130 (*)    CO2 19 (*)    Glucose, Bld 126 (*)    BUN 43 (*)    Creatinine, Ser 1.95 (*)    Total Protein 5.9 (*)    Albumin 3.2 (*)    AST 71 (*)    ALT 59 (*)    Total Bilirubin 32.3 (*)    GFR, Estimated 34 (*)    All other components within normal limits  CBC - Abnormal; Notable for the following components:   WBC 11.7 (*)    RBC 2.17 (*)    Hemoglobin 8.1 (*)    HCT 23.9 (*)    MCV 110.1 (*)    MCH 37.3 (*)    RDW 16.2 (*)    Platelets 47 (*)    All other components within normal limits  PROTIME-INR - Abnormal; Notable for the following components:   Prothrombin Time 40.6 (*)    INR 4.2 (*)  All other components within normal limits  GLUCOSE, CAPILLARY - Abnormal; Notable for the following components:   Glucose-Capillary 162 (*)    All other components within normal limits  GLUCOSE, CAPILLARY - Abnormal; Notable for the following components:   Glucose-Capillary 110 (*)    All other components within normal limits  GLUCOSE, CAPILLARY - Abnormal; Notable for the following components:   Glucose-Capillary 117 (*)    All other components within normal limits  CULTURE, BLOOD (ROUTINE X 2)  CULTURE, BLOOD (ROUTINE X 2)  GRAM STAIN  CULTURE, BODY FLUID W GRAM STAIN -BOTTLE  RESP PANEL BY RT-PCR (RSV, FLU A&B, COVID)  RVPGX2  STOOL CULTURE  LIPASE, BLOOD  AMMONIA  HEPATITIS PANEL, ACUTE  SODIUM, URINE, RANDOM  AFP TUMOR MARKER  IGG  PROTEIN, PLEURAL OR PERITONEAL FLUID  HEMOCHROMATOSIS DNA-PCR(C282Y,H63D)  HSV DNA BY PCR (REFERENCE LAB)  EBV AB TO VIRAL CAPSID AG PNL, IGG+IGM  CMV IGM  CYTOLOGY - NON PAP    EKG None  Radiology No results found.  Procedures Procedures    Medications Ordered in ED Medications  levothyroxine (SYNTHROID) tablet 50 mcg (50 mcg Oral Given 11/07/23 0509)  acidophilus (RISAQUAD) capsule 1 capsule (0 capsules Oral Duplicate  11/07/23 0901)  tamsulosin (FLOMAX) capsule 0.4 mg (0.4 mg Oral Given 11/06/23 1731)  albumin human 25 % solution 25 g (25 g Intravenous New Bag/Given 11/07/23 1331)  0.9 %  sodium chloride infusion ( Intravenous Infusion Verify 11/06/23 1804)  octreotide (SANDOSTATIN) injection 100 mcg (100 mcg Intravenous Given 11/07/23 0937)  midodrine (PROAMATINE) tablet 5 mg (5 mg Oral Given 11/07/23 1252)  oxyCODONE (Oxy IR/ROXICODONE) immediate release tablet 5 mg (5 mg Oral Given 11/05/23 0912)  sodium chloride tablet 1 g (1 g Oral Given 11/07/23 0753)  oxyCODONE (OXYCONTIN) 12 hr tablet 10 mg (0 mg Oral Duplicate 11/07/23 0902)  HYDROmorphone (DILAUDID) injection 0.5 mg (has no administration in time range)  acetylcysteine (ACETADOTE) 30.5 mg/mL load via infusion 11,670 mg (11,670 mg Intravenous Bolus from Bag 11/05/23 1453)    Followed by  acetylcysteine (ACETADOTE) 18,000 mg in dextrose 5 % 590 mL (30.5085 mg/mL) infusion (0 mg/kg/hr  77.8 kg Intravenous Stopped 11/06/23 0525)    Followed by  acetylcysteine (ACETADOTE) 18,000 mg in dextrose 5 % 590 mL (30.5085 mg/mL) infusion (6.25 mg/kg/hr  77.8 kg Intravenous New Bag/Given 11/07/23 1036)  ondansetron (ZOFRAN-ODT) disintegrating tablet 4 mg (has no administration in time range)  prochlorperazine (COMPAZINE) injection 10 mg (10 mg Intravenous Given 11/05/23 1800)  traZODone (DESYREL) tablet 50 mg (has no administration in time range)  sodium zirconium cyclosilicate (LOKELMA) packet 10 g (0 g Oral Duplicate 11/07/23 0902)  phytonadione (VITAMIN K) 10 mg in dextrose 5 % 50 mL IVPB (has no administration in time range)  sodium chloride 0.9 % bolus 1,000 mL (0 mLs Intravenous Stopped 11/03/23 1721)  sodium zirconium cyclosilicate (LOKELMA) packet 5 g (5 g Oral Given 11/03/23 1629)  phytonadione (VITAMIN K) tablet 5 mg (5 mg Oral Given 11/03/23 1630)  phytonadione (VITAMIN K) 10 mg in dextrose 5 % 50 mL IVPB (10 mg Intravenous New Bag/Given 11/04/23 1226)  phytonadione  (VITAMIN K) 10 mg in dextrose 5 % 50 mL IVPB (10 mg Intravenous New Bag/Given 11/05/23 1014)  phytonadione (VITAMIN K) 10 mg in dextrose 5 % 50 mL IVPB (10 mg Intravenous New Bag/Given 11/06/23 1046)    ED Course/ Medical Decision Making/ A&P  Medical Decision Making 83 year old male with past medical history of Joubert syndrome and cirrhosis presenting to the emergency department today with worsening generalized weakness and jaundice.  I will further evaluate patient here with labs as well as a chest x-ray.  He did have some pulmonary edema on his last visit here.  Also obtain an ammonia level.  Will discuss case with gastroenterology.  He will likely require admission.  The patient's bilirubin is significantly elevated.  I did call and discussed this with gastroenterology.  They did recommend further inpatient workup and labs are ordered.  The patient is admitted to the hospital service.  Amount and/or Complexity of Data Reviewed Labs: ordered. Radiology: ordered.  Risk Decision regarding hospitalization.           Final Clinical Impression(s) / ED Diagnoses Final diagnoses:  AKI (acute kidney injury) (HCC)  Acute liver failure without hepatic coma    Rx / DC Orders ED Discharge Orders     None         Durwin Glaze, MD 11/07/23 1610

## 2023-11-03 NOTE — ED Triage Notes (Signed)
 Pt arrived via POV from his GI Office for further evaluation of weakness, jaundice, decreased oral intake X 3 days. Pt ambulatory in Triage, A+O X 4, and denies N/V.

## 2023-11-04 ENCOUNTER — Encounter (HOSPITAL_COMMUNITY): Payer: Self-pay | Admitting: Internal Medicine

## 2023-11-04 ENCOUNTER — Inpatient Hospital Stay (HOSPITAL_COMMUNITY)

## 2023-11-04 DIAGNOSIS — K7290 Hepatic failure, unspecified without coma: Secondary | ICD-10-CM | POA: Diagnosis not present

## 2023-11-04 LAB — COMPREHENSIVE METABOLIC PANEL WITH GFR
ALT: 73 U/L — ABNORMAL HIGH (ref 0–44)
AST: 104 U/L — ABNORMAL HIGH (ref 15–41)
Albumin: 2.2 g/dL — ABNORMAL LOW (ref 3.5–5.0)
Alkaline Phosphatase: 101 U/L (ref 38–126)
Anion gap: 3 — ABNORMAL LOW (ref 5–15)
BUN: 36 mg/dL — ABNORMAL HIGH (ref 8–23)
CO2: 22 mmol/L (ref 22–32)
Calcium: 8.5 mg/dL — ABNORMAL LOW (ref 8.9–10.3)
Chloride: 101 mmol/L (ref 98–111)
Creatinine, Ser: 1.83 mg/dL — ABNORMAL HIGH (ref 0.61–1.24)
GFR, Estimated: 37 mL/min — ABNORMAL LOW (ref >60)
Glucose, Bld: 94 mg/dL (ref 70–99)
Potassium: 5.2 mmol/L — ABNORMAL HIGH (ref 3.5–5.1)
Sodium: 126 mmol/L — ABNORMAL LOW (ref 135–145)
Total Bilirubin: 26.7 mg/dL (ref 0.0–1.2)
Total Protein: 5.7 g/dL — ABNORMAL LOW (ref 6.5–8.1)

## 2023-11-04 LAB — BODY FLUID CELL COUNT WITH DIFFERENTIAL
Eos, Fluid: 1 %
Lymphs, Fluid: 26 %
Monocyte-Macrophage-Serous Fluid: 24 % — ABNORMAL LOW (ref 50–90)
Neutrophil Count, Fluid: 49 % — ABNORMAL HIGH (ref 0–25)
Total Nucleated Cell Count, Fluid: 96 uL (ref 0–1000)

## 2023-11-04 LAB — CBC
HCT: 24.5 % — ABNORMAL LOW (ref 39.0–52.0)
Hemoglobin: 8.6 g/dL — ABNORMAL LOW (ref 13.0–17.0)
MCH: 37.7 pg — ABNORMAL HIGH (ref 26.0–34.0)
MCHC: 35.1 g/dL (ref 30.0–36.0)
MCV: 107.5 fL — ABNORMAL HIGH (ref 80.0–100.0)
Platelets: 53 10*3/uL — ABNORMAL LOW (ref 150–400)
RBC: 2.28 MIL/uL — ABNORMAL LOW (ref 4.22–5.81)
RDW: 16.1 % — ABNORMAL HIGH (ref 11.5–15.5)
WBC: 8.1 10*3/uL (ref 4.0–10.5)
nRBC: 0 % (ref 0.0–0.2)

## 2023-11-04 LAB — GRAM STAIN

## 2023-11-04 LAB — PROTIME-INR
INR: 3.6 — ABNORMAL HIGH (ref 0.8–1.2)
Prothrombin Time: 36.1 s — ABNORMAL HIGH (ref 11.4–15.2)

## 2023-11-04 LAB — PROTEIN, PLEURAL OR PERITONEAL FLUID: Total protein, fluid: <3 g/dL

## 2023-11-04 MED ORDER — ALBUMIN HUMAN 25 % IV SOLN: 50.0000 g | Freq: Once | INTRAVENOUS | Status: DC

## 2023-11-04 MED ORDER — VITAMIN K1 10 MG/ML IJ SOLN
10.0000 mg | Freq: Once | INTRAVENOUS | Status: AC
  Administered 2023-11-04: 10 mg via INTRAVENOUS
  Filled 2023-11-04: qty 1

## 2023-11-04 MED ORDER — OCTREOTIDE ACETATE 100 MCG/ML IJ SOLN
100.0000 ug | Freq: Three times a day (TID) | INTRAMUSCULAR | Status: DC
  Administered 2023-11-04 – 2023-11-08 (×12): 100 ug via INTRAVENOUS
  Filled 2023-11-04 (×15): qty 1

## 2023-11-04 MED ORDER — OXYCODONE HCL 5 MG PO TABS
5.0000 mg | ORAL_TABLET | ORAL | Status: DC | PRN
  Administered 2023-11-04 – 2023-11-05 (×4): 5 mg via ORAL
  Filled 2023-11-04 (×4): qty 1

## 2023-11-04 MED ORDER — LIDOCAINE HCL (PF) 2 % IJ SOLN
INTRAMUSCULAR | Status: AC
  Filled 2023-11-04: qty 10

## 2023-11-04 MED ORDER — MIDODRINE HCL 5 MG PO TABS
5.0000 mg | ORAL_TABLET | Freq: Three times a day (TID) | ORAL | Status: DC
  Administered 2023-11-04 – 2023-11-08 (×12): 5 mg via ORAL
  Filled 2023-11-04 (×12): qty 1

## 2023-11-04 NOTE — Progress Notes (Signed)
 Mobility Specialist Progress Note:    11/04/23 0940  Mobility  Activity Ambulated with assistance in hallway  Level of Assistance Standby assist, set-up cues, supervision of patient - no hands on  Assistive Device Front wheel walker  Distance Ambulated (ft) 120 ft  Range of Motion/Exercises Active;All extremities  Activity Response Tolerated well  Mobility Referral Yes  Mobility visit 1 Mobility  Mobility Specialist Start Time (ACUTE ONLY) 0940  Mobility Specialist Stop Time (ACUTE ONLY) 1000  Mobility Specialist Time Calculation (min) (ACUTE ONLY) 20 min   Pt received in bed, visitor in room. Agreeable to mobility, required SBA to stand and ambulate with RW. Tolerated well, asx throughout. Left pt sitting EOB, RN in room. All needs meet.  Hassani Sliney Mobility Specialist Please contact via Special educational needs teacher or  Rehab office at [phone]

## 2023-11-04 NOTE — Progress Notes (Signed)
 PROGRESS NOTE    Patient: Jose Lawrence                            PCP: Ignatius Specking, MD                    DOB: 1941/06/22            DOA: 11/03/2023 JXB:[id number]             DOS: 11/04/2023, 11:09 AM   LOS: 1 day   Date of Service: The patient was seen and examined on 11/04/2023  Subjective:   The patient was seen and examined this morning. Hemodynamically stable. Very jaundiced, icteric eyes, awake alert, pleasant.   Brief Narrative:   Jose Lawrence  is a 83 y.o. male, with medical history significant of pulmonary embolism, DM II, BPH, hypothyroidism, colon Ca s/p resection/colostomy ( follows Dr Ellin Saba) and liver cirrhosis, prior strep agalactiae bacteremia in 05/2023, finished IV daptomycin 10/07/2023, recent hospitalization 3/10 until 3/11, for painless jaundice, CT and MRCP without obstructive or other acute biliary abnormality.. -Patient presents to ED today he was sent from GI office given secondary to complaints for generalized weakness, poor appetite, cannot eat, lethargic sleeping all day and skin has been more jaundiced.  Reports he has been holding on his walks because of his weakness, reports he is feeling tired all the time, has no appetite, did once last week, denies any chest pain, coffee-ground emesis, no blood with stools. -In ED his workup significant for sodium 122, potassium 5.2, chloride 94, glucose 155, creatinine is up to 1.89 from 0.85, albumin low at 1.8, alk phos elevated at 141, total bilirubin significantly elevated at 29.3, was 10.4 on 3/11, white blood cell count elevated at 15, hemoglobin at 11.6, from baseline 9.1 3/11, platelet count low at 79K, INR is pending, ultrasound is pending, [facility]ist consulted to admit.     Assessment & Plan:   Principal Problem:   Liver failure (HCC) Active Problems:   Cirrhosis of liver (HCC)   Acquired hypothyroidism   BPH (benign prostatic hyperplasia)   Acute renal failure superimposed on stage 3a  chronic kidney disease (HCC)   Elevated LFTs   Autoimmune hepatitis (HCC)   Jaundice   Chronic idiopathic thrombocytopenia (HCC)       Decompensated liver failure/ Transaminitis /  Coagulopathy Hypoalbuminemia / Hyperbilirubinemia/ Jaundice  -he is  awake alert, following command, but complaining of any seizure type activity or pleurisy -GI consulted, pending evaluation recommendations -Ammonia within normal limit, no indication for lactulose -Patient still seems to be volume depleted, agree with holding Aldactone, and Lasix  -Trend vitals, continue home medication of Coreg, dose of vitamin K was given for coagulopathy,  -Upper quadrant ultrasound is pending -Avoid hepatotoxic medications - No Signs of overt infection, afebrile, WBC improved from 15.0-8.1 this morning -UA negative for leukocyte esterase, rare bacteria, WBC 0-5, -Follow-up with blood cultures     Latest Ref Rng & Units 11/04/2023    4:26 AM 11/03/2023    4:14 PM 11/03/2023    1:02 PM  Hepatic Function  Total Protein 6.5 - 8.1 g/dL 5.7  6.5  7.2   Albumin 3.5 - 5.0 g/dL 2.2  1.6  1.8   AST 15 - 41 U/L 104  126  137   ALT 0 - 44 U/L 73  90  96   Alk Phosphatase 38 - 126 U/L 101  130  141   Total Bilirubin 0.0 - 1.2 mg/dL 45.4  09.8  11.9   Bilirubin, Direct 0.0 - 0.2 mg/dL  14.7     -For jaundice-hyper bilirubinemia -consider IV fluid unsure if patient can tolerate -Hepatitis panel nonreactive -INR 2.0    AKI and CKD stage III unspecified Concern for hepatorenal syndrome -Patient appears to be clinically dehydration, continue with the fluid challenge -UA with signs of section UA gravity 1.017 -Hold Aldactone and Lasix.  And Coreg   -Will continue with empirically on Albumin, Octreotide and Duradrin -Monitoring renal function, BUN/creatinine remained stable GFR 35, 37-stable   Hyponatremia -Sodium 122>>> proved to 126, -Most likely due to volume depletion and dehydration -Lasix and Aldactone -on  hold - Urine sodium - at 60 -Continue with IV normal saline   Hyperkalemia -was given Lokelma, hold Aldactone -Will give another dose of Lokelma as potassium still at 5.2   Thrombocytopenia -Lower than baseline, SCD for DVT prophylaxis   Diabetes mellitus, type II -Hold oral agent and keep an insulin sliding scale   Hypothyroidism -Continue Synthroid    Ethics-patient is full code, given the above findings and comorbidities, patient prognosis poor.  Palliative care will be consulted     ----------------------------------------------------------------------------------------------------------------------------------------------- Nutritional status:  The patient's BMI is: Body mass index is 23.26 kg/m. I agree with the assessment and plan as outlined  ------------------------------------------------------------------------------------------------------------------------------------------------  DVT prophylaxis:  SCDs Start: 11/03/23 1603   Code Status:   Code Status: Full Code  Family Communication: No family member present at bedside- -Advance care planning has been discussed.   Admission status:   Status is: Inpatient Remains inpatient appropriate because: Patient declining, needing intervention, for acute on chronic liver failure   Disposition: From  - home             Planning for discharge in 1-2 days: to   Procedures:   No admission procedures for hospital encounter.   Antimicrobials:  Anti-infectives (From admission, onward)    None        Medication:   acidophilus  1 capsule Oral Daily   levothyroxine  50 mcg Oral QAC breakfast   midodrine  2.5 mg Oral TID WC   octreotide  100 mcg Subcutaneous Q8H   tamsulosin  0.4 mg Oral QPC supper    hydrALAZINE   Objective:   Vitals:   11/03/23 2201 11/04/23 0200 11/04/23 0559 11/04/23 0850  BP: (!) 104/39 (!) 101/49 (!) 111/53 115/62  Pulse: 65 74 (!) 58   Resp: 14     Temp: 97.8 F (36.6 C)  98.4 F (36.9 C) (!) 97.5 F (36.4 C)   TempSrc: Oral Oral Oral   SpO2: 99% 98% 97%   Weight:      Height:        Intake/Output Summary (Last 24 hours) at 11/04/2023 1109 Last data filed at 11/04/2023 1006 Gross per 24 hour  Intake 2022.39 ml  Output --  Net 2022.39 ml   Filed Weights   11/03/23 1245 11/03/23 1751  Weight: 79.5 kg 77.8 kg     Physical examination:   Constitution:  Alert, cooperative, no distress,  Appears calm and comfortable  Psychiatric:   Normal and stable mood and affect, cognition intact,   HEENT:     Icteric eyes normocephalic, PERRL, otherwise with in Normal limits  Chest:         Chest symmetric Cardio vascular:  S1/S2, RRR, No murmure, No Rubs or Gallops  pulmonary: Clear to auscultation  bilaterally, respirations unlabored, negative wheezes / crackles Abdomen: Colostomy present, functional  soft, non-tender, non-distended, bowel sounds,no masses, no organomegaly Muscular skeletal: Limited exam - in bed, able to move all 4 extremities,   Neuro: CNII-XII intact. , normal motor and sensation, reflexes intact  Extremities: No pitting edema lower extremities, +2 pulses  Skin: Diffuse jaundice, dry, warm to touch, negative for any Rashes, No open wounds Wounds: per nursing documentation   ------------------------------------------------------------------------------------------------------------------------------------------    LABs:     Latest Ref Rng & Units 11/04/2023    4:26 AM 11/03/2023    1:02 PM 10/18/2023    3:50 AM  CBC  WBC 4.0 - 10.5 K/uL 8.1  15.0  5.6   Hemoglobin 13.0 - 17.0 g/dL 8.6  16.1  9.1   Hematocrit 39.0 - 52.0 % 24.5  33.0  26.2   Platelets 150 - 400 K/uL 53  79  87       Latest Ref Rng & Units 11/04/2023    4:26 AM 11/03/2023    4:14 PM 11/03/2023    1:02 PM  CMP  Glucose 70 - 99 mg/dL 94   096   BUN 8 - 23 mg/dL 36   35   Creatinine 0.45 - 1.24 mg/dL 4.09   8.11   Sodium 914 - 145 mmol/L 126   122   Potassium 3.5 -  5.1 mmol/L 5.2   5.2   Chloride 98 - 111 mmol/L 101   94   CO2 22 - 32 mmol/L 22   20   Calcium 8.9 - 10.3 mg/dL 8.5   8.8   Total Protein 6.5 - 8.1 g/dL 5.7  6.5  7.2   Total Bilirubin 0.0 - 1.2 mg/dL 78.2  95.6  21.3   Alkaline Phos 38 - 126 U/L 101  130  141   AST 15 - 41 U/L 104  126  137   ALT 0 - 44 U/L 73  90  96        Micro Results Recent Results (from the past 240 hours)  Blood culture (routine x 2)     Status: None (Preliminary result)   Collection Time: 11/03/23  4:14 PM   Specimen: BLOOD  Result Value Ref Range Status   Specimen Description BLOOD RIGHT ANTECUBITAL  Final   Special Requests   Final    BOTTLES DRAWN AEROBIC AND ANAEROBIC Blood Culture adequate volume   Culture   Final    NO GROWTH < 24 HOURS Performed at [facility], [address] [place], [place] [address]    Report Status PENDING  Incomplete  Blood culture (routine x 2)     Status: None (Preliminary result)   Collection Time: 11/03/23  4:14 PM   Specimen: BLOOD  Result Value Ref Range Status   Specimen Description BLOOD BOTTLES DRAWN AEROBIC ONLY  Final   Special Requests   Final    Blood Culture results may not be optimal due to an inadequate volume of blood received in culture bottles   Culture   Final    NO GROWTH < 24 HOURS Performed at [facility], [address] [place], [place] [address]    Report Status PENDING  Incomplete    Radiology Reports US ABDOMEN LIMITED WITH LIVER DOPPLER Result Date: 11/03/2023 CLINICAL DATA:  Right upper quadrant pain EXAM: DUPLEX ULTRASOUND OF LIVER TECHNIQUE: Color and duplex Doppler ultrasound was performed to evaluate the hepatic in-flow and out-flow vessels. COMPARISON:  MRI from 10/18/2023 FINDINGS: Liver:  Increase in echogenicity with mild nodularity and significant Peri hepatic ascites. No focal lesion, mass or intrahepatic biliary ductal dilatation. Main Portal Vein size: 1 cm cm Portal Vein Velocities: All portal vein branches are noted to be  hepatopetal inflow. Main Prox:  22 cm/sec Main Mid: 17 cm/sec Main Dist:  8.7 cm/sec Right: 23.4 cm/sec Left: 19.2 cm/sec Hepatic Vein Velocities: All hepatic vein branches are noted to be hepatofugal in flow. Right:  33.7 cm/sec Middle:  35.4 cm/sec Left:  31.7 cm/sec IVC: Present and patent with normal respiratory phasicity. Hepatic Artery Velocity:  126.5 cm/sec Splenic Vein Velocity:  27.3 cm/sec Spleen: 11.6 cm x 4.5 cm x 11.0 cm with a total volume of 363 cm^3 (411 cm^3 is upper limit normal) Portal Vein Occlusion/Thrombus: No Splenic Vein Occlusion/Thrombus: No Ascites: Present Varices: Absent IMPRESSION: Moderate ascites is noted. Cirrhotic changes of liver are seen. No portal vein occlusion is seen. No significant hip paddle few both flow in the portal venous branches is noted. Electronically Signed   By: Alcide Clever M.D.   On: 11/03/2023 19:36   DG Chest Portable 1 View Result Date: 11/03/2023 CLINICAL DATA:  Fatigue EXAM: PORTABLE CHEST 1 VIEW COMPARISON:  10/17/2023 FINDINGS: Stable cardiomediastinal contours. Decreased bilateral pleural effusions with small residual effusion on the right. Mild right basilar atelectasis. No pneumothorax. IMPRESSION: Decreased bilateral pleural effusions with small residual effusion on the right. Electronically Signed   By: Duanne Guess D.O.   On: 11/03/2023 14:17    SIGNED: Kendell Bane, MD, FHM. FAAFP. [facility] - [facility] Time spent - 55 min.  In seeing, evaluating and examining the patient. Reviewing medical records, labs, drawn plan of care. [facility],  Pager (please use [email or web address] to page/ text) Please use Epic Secure Chat for non-urgent communication (7AM-7PM)  If 7PM-7AM, please contact night-coverage [email or web address], 11/04/2023, 11:09 AM

## 2023-11-04 NOTE — Progress Notes (Signed)
 Patient tolerated right sided Paracentesis procedure well today and 1 Liters of yellow/amber colored fluid removed and sent to lab for processing. Patient verbalized understanding of post procedure instructions and transported back to inpatient bed assignment at this time with no acute distress noted.

## 2023-11-04 NOTE — Hospital Course (Addendum)
 Jose Lawrence  is a 83 y.o. male, with medical history significant of pulmonary embolism, DM II, BPH, hypothyroidism, colon Ca s/p resection/colostomy ( follows Dr Ellin Saba) and liver cirrhosis, prior strep agalactiae bacteremia in 05/2023, finished IV daptomycin 10/07/2023, recent hospitalization 3/10 until 3/11, for painless jaundice, CT and MRCP without obstructive or other acute biliary abnormality.. -Patient presents to ED today he was sent from GI office given secondary to complaints for generalized weakness, poor appetite, cannot eat, lethargic sleeping all day and skin has been more jaundiced.  Reports he has been holding on his walks because of his weakness, reports he is feeling tired all the time, has no appetite, did once last week, denies any chest pain, coffee-ground emesis, no blood with stools. -In ED his workup significant for sodium 122, potassium 5.2, chloride 94, glucose 155, creatinine is up to 1.89 from 0.85, albumin low at 1.8, alk phos elevated at 141, total bilirubin significantly elevated at 29.3, was 10.4 on 3/11, white blood cell count elevated at 15, hemoglobin at 11.6, from baseline 9.1 3/11, platelet count low at 79K, INR is pending, ultrasound is pending, [facility] hospitalist consulted to admit.     Assessment & Plan:   Principal Problem:   Liver failure (HCC) Active Problems:   Cirrhosis of liver (HCC)   Acquired hypothyroidism   BPH (benign prostatic hyperplasia)   Acute renal failure 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)   Elevated LFTs   Autoimmune hepatitis (HCC)   Jaundice   Chronic idiopathic thrombocytopenia (HCC)       Decompensated liver failure/ Transaminitis /  Coagulopathy Hypoalbuminemia / Hyperbilirubinemia/ Jaundice  -Monitoring LFTs, no significant improvement-bilirubin still elevated -GI following closely -Continue octreotide, -Ammonia within normal limit, no indication for lactulose - Still seems to be volume depleted,  agree with holding Aldactone, and Lasix  -Trend vitals, holding home medication of Coreg,  - -Avoid hepatotoxic medications -Due to elevated INR, another dose of vitamin K 10 mg given for coagulopathy   Ascites: -Upper quadrant ultrasound -positive abdominal fluid-ascites s/p paracentesis yielding 1 L -Following with body fluid cytology and culture -Withholding antibiotics for now  Transaminitis -Due to decompensated liver failure - No Signs of overt infection, afebrile, WBC improved from 15.0-8.1 this morning -UA negative for leukocyte esterase, rare bacteria, WBC 0-5, -Follow-up with blood cultures     Latest Ref Rng & Units 11/05/2023    4:30 AM 11/04/2023    4:26 AM 11/03/2023    4:14 PM  Hepatic Function  Total Protein 6.5 - 8.1 g/dL 5.7  5.7  6.5   Albumin 3.5 - 5.0 g/dL 2.5  2.2  1.6   AST 15 - 41 U/L 104  104  126   ALT 0 - 44 U/L 73  73  90   Alk Phosphatase 38 - 126 U/L 95  101  130   Total Bilirubin 0.0 - 1.2 mg/dL 40.3  47.4  25.9   Bilirubin, Direct 0.0 - 0.2 mg/dL   56.3    -For jaundice-hyper bilirubinemia -consider IV fluid unsure if patient can tolerate -Hepatitis panel nonreactive -INR 2.8, 3.6, 3.1    AKI and CKD stage III unspecified Concern for hepatorenal syndrome -POA-dehydrated, continue gentle IV fluid hydration with albumin -Concern for hepatorenal syndrome -With IV fluid and ascites concerning for third spacing -UA with signs of section UA gravity 1.017  -Hold Aldactone and Lasix.  And Coreg   -Will continue with empirically on Albumin, Octreotide and Duradrin -Monitoring renal function,  BUN/creatinine remained stable GFR 35, 37-stable   Hyponatremia -Sodium 122>>> proved to 126, 126 -Most likely due to volume depletion and dehydration -Lasix and Aldactone -on hold - Urine sodium - at 60 -Continue with IV normal saline -Adding sodium supplements   Hypotensive  -Due to dehydration, acute liver failure -Added midodrine -Monitoring BP  closely   hyperkalemia -was given Lokelma, hold Aldactone -Will give another dose of Lokelma as potassium still at 5.2>> 5.0   Thrombocytopenia -Lower than baseline, SCD for DVT prophylaxis -Platelets 53, 52,   Diabetes mellitus, type II -Hold oral agent and keep an insulin sliding scale   Hypothyroidism -Continue Synthroid    Back pain/abdominal pain  -As needed p.o., IV analgesics added  ethics-patient is full code, given the above findings and comorbidities, patient prognosis poor.  Palliative care will be consulted

## 2023-11-04 NOTE — Plan of Care (Signed)

## 2023-11-04 NOTE — Procedures (Signed)
 PROCEDURE SUMMARY:  Successful ultrasound guided paracentesis from the right lower quadrant.  Yielded 1 L of bright yellow fluid.  No immediate complications.  The patient tolerated the procedure well.   Specimen sent for labs.  EBL < 2 mL  If the patient eventually requires >/=2 paracenteses in a 30 day period, screening evaluation by the [facility] will be assessed.   Alwyn Ren, AGACNP-BC 11/04/2023, 2:51 PM

## 2023-11-04 NOTE — Progress Notes (Addendum)
 Gastroenterology Progress Note   Referring Provider: No ref. provider found Primary Care Physician:  Ignatius Specking, MD Primary Gastroenterologist:  Dr. Levon Hedger  Patient ID: Jose Lawrence; [id number]; Feb 11, 1941    Subjective   Feeling well today.  Has any abdominal pain, nausea, vomiting had increased output out of his ostomy.  States consistency is more back to baseline, usually gravy consistency.  Has had a little bit more gas from this.  Denies any melena or BRBPR.  Denies mental status changes or pruritus.  Energy level improved this morning.  Wife at bedside with patient.   Objective   Vital signs in last 24 hours Temp:  [97.5 F (36.4 C)-98.4 F (36.9 C)] 97.5 F (36.4 C) (03/28 0559) Pulse Rate:  [58-94] 58 (03/28 0559) Resp:  [14-21] 14 (03/27 2201) BP: (101-138)/(39-77) 115/62 (03/28 0850) SpO2:  [97 %-100 %] 97 % (03/28 0559) Weight:  [77.8 kg-79.5 kg] 77.8 kg (03/27 1751)    Physical Exam General:   Alert and oriented, pleasant.  Jaundice Head:  Normocephalic and atraumatic. Eyes: Sclera icterus.  Sclera clear. Conjuctiva pink.  Mouth:  Without lesions, mucosa pink and moist.  Neck:  Supple, without thyromegaly or masses.  Heart:  S1, S2 present, no murmurs noted.  Lungs: Clear to auscultation bilaterally, without wheezing, rales, or rhonchi.  Abdomen:  Bowel sounds present, soft, nontender. Mild distention. Ostomy present to RUQ,  No HSM or hernias noted. No rebound or guarding. No masses appreciated  Extremities:  1+ bilateral edema.  Neurologic:  Alert and  oriented x4;  grossly normal neurologically. No asterixis.  Psych:  Alert and cooperative. Normal mood and affect.  Intake/Output from previous day: 03/27 0701 - 03/28 0700 In: 1782.4 [P.O.:480; I.V.:34.8; IV Piggyback:1267.6] Out: -  Intake/Output this shift: Total I/O In: 240 [P.O.:240] Out: -   Lab Results  Recent Labs    11/03/23 1302 11/04/23 0426  WBC 15.0* 8.1  HGB 11.6* 8.6*   HCT 33.0* 24.5*  PLT 79* 53*   BMET Recent Labs    11/03/23 1302 11/04/23 0426  NA 122* 126*  K 5.2* 5.2*  CL 94* 101  CO2 20* 22  GLUCOSE 155* 94  BUN 35* 36*  CREATININE 1.89* 1.83*  CALCIUM 8.8* 8.5*   LFT Recent Labs    11/03/23 1302 11/03/23 1614 11/04/23 0426  PROT 7.2 6.5 5.7*  ALBUMIN 1.8* 1.6* 2.2*  AST 137* 126* 104*  ALT 96* 90* 73*  ALKPHOS 141* 130* 101  BILITOT 29.3* 27.3* 26.7*  BILIDIR  --  15.3*  --   IBILI  --  12.0*  --    PT/INR Recent Labs    11/03/23 1614 11/04/23 0426  LABPROT 30.0* 36.1*  INR 2.8* 3.6*   Hepatitis Panel Recent Labs    11/03/23 1614  HEPBSAG NON REACTIVE  HCVAB NON REACTIVE  HEPAIGM NON REACTIVE  HEPBIGM NON REACTIVE    Studies/Results US ABDOMEN LIMITED WITH LIVER DOPPLER Result Date: 11/03/2023 CLINICAL DATA:  Right upper quadrant pain EXAM: DUPLEX ULTRASOUND OF LIVER TECHNIQUE: Color and duplex Doppler ultrasound was performed to evaluate the hepatic in-flow and out-flow vessels. COMPARISON:  MRI from 10/18/2023 FINDINGS: Liver: Increase in echogenicity with mild nodularity and significant Peri hepatic ascites. No focal lesion, mass or intrahepatic biliary ductal dilatation. Main Portal Vein size: 1 cm cm Portal Vein Velocities: All portal vein branches are noted to be hepatopetal inflow. Main Prox:  22 cm/sec Main Mid: 17 cm/sec Main Dist:  8.7 cm/sec  Right: 23.4 cm/sec Left: 19.2 cm/sec Hepatic Vein Velocities: All hepatic vein branches are noted to be hepatofugal in flow. Right:  33.7 cm/sec Middle:  35.4 cm/sec Left:  31.7 cm/sec IVC: Present and patent with normal respiratory phasicity. Hepatic Artery Velocity:  126.5 cm/sec Splenic Vein Velocity:  27.3 cm/sec Spleen: 11.6 cm x 4.5 cm x 11.0 cm with a total volume of 363 cm^3 (411 cm^3 is upper limit normal) Portal Vein Occlusion/Thrombus: No Splenic Vein Occlusion/Thrombus: No Ascites: Present Varices: Absent IMPRESSION: Moderate ascites is noted. Cirrhotic changes  of liver are seen. No portal vein occlusion is seen. No significant hip paddle few both flow in the portal venous branches is noted. Electronically Signed   By: Alcide Clever M.D.   On: 11/03/2023 19:36   DG Chest Portable 1 View Result Date: 11/03/2023 CLINICAL DATA:  Fatigue EXAM: PORTABLE CHEST 1 VIEW COMPARISON:  10/17/2023 FINDINGS: Stable cardiomediastinal contours. Decreased bilateral pleural effusions with small residual effusion on the right. Mild right basilar atelectasis. No pneumothorax. IMPRESSION: Decreased bilateral pleural effusions with small residual effusion on the right. Electronically Signed   By: Duanne Guess D.O.   On: 11/03/2023 14:17   MR ABDOMEN MRCP W WO CONTAST Result Date: 10/18/2023 CLINICAL DATA:  Jaundice.  History of colon cancer. EXAM: MRI ABDOMEN WITHOUT AND WITH CONTRAST (INCLUDING MRCP) TECHNIQUE: Multiplanar multisequence MR imaging of the abdomen was performed both before and after the administration of intravenous contrast. Heavily T2-weighted images of the biliary and pancreatic ducts were obtained, and three-dimensional MRCP images were rendered by post processing. CONTRAST:  7mL GADAVIST GADOBUTROL 1 MMOL/ML IV SOLN COMPARISON:  CT AP from 10/17/2023 FINDINGS: Lower chest: Moderate right and small left pleural effusions. Hepatobiliary: The liver is small with a diffusely nodular contour compatible with cirrhosis. No enhancing liver lesions identified following contrast administration. The gallbladder is surgically absent. Although the MRCP images are nondiagnostic due to motion degradation, there is no intrahepatic or extrahepatic bile duct dilatation. Common bile duct is within normal limits measuring 5 mm. Pancreas:  No signs of main duct dilatation or mass. Spleen:  Normal in size no focal abnormality. Adrenals/Urinary Tract: Normal adrenal glands. There are 2 subcentimeter T2 hyperintense cyst arising off the lateral cortex of the right kidney measuring up to 6  mm. These are compatible with benign Bosniak class 2 cyst. No follow-up imaging recommended. Stomach/Bowel: Stomach appears normal. No pathologic dilatation of the large or small bowel loops. Right abdominal ileostomy. Previous colectomy. Vascular/Lymphatic: Normal caliber of the abdominal aorta. Small esophageal and perisplenic varices identified.No adenopathy identified. Other: Moderate volume of abdominal ascites. No discrete fluid collections identified. Musculoskeletal: No suspicious bone lesions identified. IMPRESSION: 1. Cirrhosis of the liver. 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 2. Moderate right and small left pleural effusions. 3. Previous colectomy with right abdominal ileostomy. Electronically Signed   By: Signa Kell M.D.   On: 10/18/2023 11:35   MR 3D Recon At Scanner Result Date: 10/18/2023 CLINICAL DATA:  Jaundice.  History of colon cancer. EXAM: MRI ABDOMEN WITHOUT AND WITH CONTRAST (INCLUDING MRCP) TECHNIQUE: Multiplanar multisequence MR imaging of the abdomen was performed both before and after the administration of intravenous contrast. Heavily T2-weighted images of the biliary and pancreatic ducts were obtained, and three-dimensional MRCP images were rendered by post processing. CONTRAST:  7mL GADAVIST GADOBUTROL 1 MMOL/ML IV SOLN COMPARISON:  CT AP from 10/17/2023 FINDINGS: Lower chest: Moderate right and small left pleural effusions. Hepatobiliary: The liver is small  with a diffusely nodular contour compatible with cirrhosis. No enhancing liver lesions identified following contrast administration. The gallbladder is surgically absent. Although the MRCP images are nondiagnostic due to motion degradation, there is no intrahepatic or extrahepatic bile duct dilatation. Common bile duct is within normal limits measuring 5 mm. Pancreas:  No signs of main duct dilatation or mass. Spleen:  Normal in size no focal abnormality. Adrenals/Urinary Tract:  Normal adrenal glands. There are 2 subcentimeter T2 hyperintense cyst arising off the lateral cortex of the right kidney measuring up to 6 mm. These are compatible with benign Bosniak class 2 cyst. No follow-up imaging recommended. Stomach/Bowel: Stomach appears normal. No pathologic dilatation of the large or small bowel loops. Right abdominal ileostomy. Previous colectomy. Vascular/Lymphatic: Normal caliber of the abdominal aorta. Small esophageal and perisplenic varices identified.No adenopathy identified. Other: Moderate volume of abdominal ascites. No discrete fluid collections identified. Musculoskeletal: No suspicious bone lesions identified. IMPRESSION: 1. Cirrhosis of the liver. 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 2. Moderate right and small left pleural effusions. 3. Previous colectomy with right abdominal ileostomy. Electronically Signed   By: Signa Kell M.D.   On: 10/18/2023 11:35   US Abdomen Limited RUQ (LIVER/GB) Result Date: 10/17/2023 CLINICAL DATA:  Cirrhosis and HCC screening. EXAM: ULTRASOUND ABDOMEN LIMITED RIGHT UPPER QUADRANT COMPARISON:  CT chest abdomen pelvis 09/19/2023 FINDINGS: Gallbladder: Surgically removed Common bile duct: Diameter: 0.6 cm Liver: Liver has a nodular contour and compatible with cirrhosis. No discrete liver lesion. Perihepatic ascites. Hepatopetal flow in the main portal vein. Other: Right pleural effusion. IMPRESSION: 1. Cirrhotic liver without a discrete liver lesion. 2. Ascites. 3. Right pleural effusion. Electronically Signed   By: Richarda Overlie M.D.   On: 10/17/2023 19:24   CT ABDOMEN PELVIS W CONTRAST Result Date: 10/17/2023 CLINICAL DATA:  Liver failure.  Jaundice. EXAM: CT ABDOMEN AND PELVIS WITH CONTRAST TECHNIQUE: Multidetector CT imaging of the abdomen and pelvis was performed using the standard protocol following bolus administration of intravenous contrast. RADIATION DOSE REDUCTION: This exam was performed  according to the departmental dose-optimization program which includes automated exposure control, adjustment of the mA and/or kV according to patient size and/or use of iterative reconstruction technique. CONTRAST:  OMNIPAQUE IOHEXOL 300 MG/ML  SOLN COMPARISON:  09/19/2023 FINDINGS: Lower chest: Bilateral pleural effusions. Right pleural effusion is larger than left. Hepatobiliary: Liver is small to mildly nodular contour. Findings are suggestive for cirrhosis. Gallbladder is surgically absent. Main portal veins are patent. No biliary dilatation. Pancreas: Unremarkable. No pancreatic ductal dilatation or surrounding inflammatory changes. Spleen: Spleen is mildly prominent for size but stable. No focal abnormality. Adrenals/Urinary Tract: Normal adrenal glands. Small right renal cysts. No hydronephrosis. Normal appearance of the urinary bladder. Stomach/Bowel: Possible small hiatal hernia. Small esophageal varices. No acute abnormality involving the stomach. Evidence for colectomy and right abdominal ileostomy. No evidence for bowel dilatation or obstruction. Diffuse mesenteric edema is again noted. Vascular/Lymphatic: Atherosclerotic calcifications involving the abdominal aorta and iliac arteries without aneurysm. Portal venous system is patent. Evidence for small esophageal varices. IVC and renal veins are patent. No gross abnormality to the iliac veins. No significant lymph node enlargement in the abdomen or pelvis. Reproductive: Prostate is unremarkable. Other: Chronic low-density material in the presacral region that is not significantly changed. This low-density soft tissue roughly measures 4.8 x 2.2 cm image 67/2 and findings are most compatible with postoperative changes. Diffuse mesenteric edema. Again noted is abdominal ascites particularly around the liver. Subcutaneous edema.  Musculoskeletal: No acute bone abnormality. IMPRESSION: 1. No acute abnormality in the abdomen or pelvis. 2. Persistent  bilateral pleural effusions, right side larger than left. 3. Stable appearance of the liver without biliary dilatation. 4. Liver is small with evidence of cirrhosis. Evidence for portal hypertension demonstrated by ascites, mild splenomegaly and esophageal varices. 5. Persistent mesenteric edema and subcutaneous edema. 6. Status post colectomy with end ileostomy. Chronic postoperative changes in the presacral space. Electronically Signed   By: Richarda Overlie M.D.   On: 10/17/2023 19:21   DG Chest 2 View Result Date: 10/17/2023 CLINICAL DATA:  prior thora, now w jaundice.  Former smoker. EXAM: CHEST - 2 VIEW COMPARISON:  09/24/2023. FINDINGS: Mild diffuse interstitial prominence. It is uncertain whether this represents artifactual "crowding" of the interstitium related to suboptimal inspiration versus true infiltrate. Bilateral lung fields are otherwise clear. No acute consolidation or lung collapse. There is blunting of bilateral posterior costophrenic angles, suggesting bilateral small pleural effusions. Stable cardio-mediastinal silhouette. No acute osseous abnormalities. The soft tissues are within normal limits. IMPRESSION: *Mild diffuse interstitial prominence, as discussed above. *Bilateral small pleural effusions. Electronically Signed   By: Jules Schick M.D.   On: 10/17/2023 16:59    Assessment  83 y.o. male with a history of anemia, A-fib, stroke, PE, type 2 diabetes, hypothyroidism, colon cancer s/p low anterior resection APR with removal of breast to colon and permanent ileostomy, cirrhosis, bacteremia with staph lugdunensis s/p IV antibiotics with daptomycin through 10/07/2023 who presented yesterday at the recommendation of our office for follow-up of painless jaundice along with worsening fatigue.  Painless jaundice/hyperbilirubinemia: -Most recent hospitalization for painless jaundice thought to be secondary to Daptomycin in the setting of prior bacteremia.  -Ferritin elevated (938) however  could be reactive. Iron sat elevated currently (67%) and 1 month ago (92%) - iron deposits noted on liver biopsy - Now with significant increase in bilirubin. Was 11.8 two weeks ago and as low as 4.1 in February. October 2024 - 2.8.     Latest Ref Rng & Units 11/04/2023    4:26 AM 11/03/2023    4:14 PM 11/03/2023    1:02 PM  Hepatic Function  Total Protein 6.5 - 8.1 g/dL 5.7  6.5  7.2   Albumin 3.5 - 5.0 g/dL 2.2  1.6  1.8   AST 15 - 41 U/L 104  126  137   ALT 0 - 44 U/L 73  90  96   Alk Phosphatase 38 - 126 U/L 101  130  141   Total Bilirubin 0.0 - 1.2 mg/dL 16.1  09.6  04.5   Bilirubin, Direct 0.0 - 0.2 mg/dL  40.9    -Currently concerned about acute liver failure given bilirubin and AKI although unclear if ischemia secondary to dehydration playing a role currently. Bilirubin does have a slow lag time of rise and fall compared to aminotransferases.  - Sepsis workup thus far negative. Blood cultures NGTD x2 (24h), UA unremarkable, acute hepatitis panel neg.  -assessing for hemachromatosis - labs ordered today. Reassess IgG.  - MRI/MRCP 3/11 without evidence of intra or extrahepatic biliary ductal dilation.  -US liver doppler with significant perihepatic ascites but no evidence of PVT or hepatic vein thrombosis.  -Given concern for infection secondary to leukocytosis, will assess for SBP given ascites and provide additional albumin today given mild hypotension. Hyperbilirubinemia can have a high predictor level for SBP.  -Prognosis guarded.   Cirrhosis: - etiology unclear, prior biopsy with no evidence of autoimmune hepatitis  despite weakly positive ASMA and slightly elevated IgG.  -MELD 3.0: 41 currently (driven by bilirubin and hyponatremia); previously 31 on 10/17/23 and 26 in February.  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 Not currently on BB as d/c'd after acute illness and bradycardia. (Normal HR in clinic yesterday).  -AFP normal 2 weeks ago.  - Vitamin K IV today given elevated INR  despite dose yesterday. - No HE present.   LE edema, ascites: - after prior hospitalization he has been maintained on lasix 20 and aldactone 50 mg daily, currently on hold given hypotension and AKI.  -significant improvement in LE edema since prior discharge -abdominal distention is present - will perform paracentesis today for some therapeutic purpose  Anemia: - Hgb 9.1 two weeks ago >> 11.6>>8.6 -multifactorial. Suspect elevated hgb to 11.6 yesterday secondary to hemoconcentration given dehydration.  -EGD 05/2023 with grade 1 varices and PHG. Colonoscopy 09/2021 with congested erythematous mucosa at ileostomy stom and in the distal ileum.  Plan / Recommendations  Hemochromatosis DNA Diagnostic paracentesis today Finish albumin 25% for total of 100g Hold aldactone and lasix for now given AKI Continue to hold BB Trend HFP, recheck INR tomorrow.  If no significant improvement in Cr in next 24 hours then consider nephrology consult for HRS Transfuse platelets if < 10 or overt bleeding Vitamin K IV today given elevated INR despite dose yesterday.  Agree with palliative consult - will need to see Monday if still inpatient.  Per discussion with Dr. Marletta Lor, will give midodrine 5mg  TID.    Addendum: Paracentesis labs reviewed.  Gram stain negative.  PMNs <250.  No evidence of SBP.  Await culture.   LOS: 1 day    11/04/2023, 10:27 AM   Brooke Bonito, MSN, FNP-BC, AGACNP-BC [facility]

## 2023-11-04 NOTE — Progress Notes (Signed)
 Date and time results received: 11/04/23 0615  Test: bilirubin Critical Value: 26.7  Name of Provider Notified: Opyd

## 2023-11-05 DIAGNOSIS — K7290 Hepatic failure, unspecified without coma: Secondary | ICD-10-CM | POA: Diagnosis not present

## 2023-11-05 DIAGNOSIS — D689 Coagulation defect, unspecified: Secondary | ICD-10-CM | POA: Diagnosis not present

## 2023-11-05 DIAGNOSIS — B179 Acute viral hepatitis, unspecified: Secondary | ICD-10-CM

## 2023-11-05 DIAGNOSIS — I8500 Esophageal varices without bleeding: Secondary | ICD-10-CM

## 2023-11-05 DIAGNOSIS — R188 Other ascites: Secondary | ICD-10-CM

## 2023-11-05 DIAGNOSIS — K766 Portal hypertension: Secondary | ICD-10-CM | POA: Diagnosis not present

## 2023-11-05 LAB — COMPREHENSIVE METABOLIC PANEL WITH GFR
ALT: 73 U/L — ABNORMAL HIGH (ref 0–44)
AST: 104 U/L — ABNORMAL HIGH (ref 15–41)
Albumin: 2.5 g/dL — ABNORMAL LOW (ref 3.5–5.0)
Alkaline Phosphatase: 95 U/L (ref 38–126)
Anion gap: 5 (ref 5–15)
BUN: 32 mg/dL — ABNORMAL HIGH (ref 8–23)
CO2: 21 mmol/L — ABNORMAL LOW (ref 22–32)
Calcium: 8.6 mg/dL — ABNORMAL LOW (ref 8.9–10.3)
Chloride: 100 mmol/L (ref 98–111)
Creatinine, Ser: 1.53 mg/dL — ABNORMAL HIGH (ref 0.61–1.24)
GFR, Estimated: 46 mL/min — ABNORMAL LOW (ref >60)
Glucose, Bld: 120 mg/dL — ABNORMAL HIGH (ref 70–99)
Potassium: 5 mmol/L (ref 3.5–5.1)
Sodium: 126 mmol/L — ABNORMAL LOW (ref 135–145)
Total Bilirubin: 29.4 mg/dL (ref 0.0–1.2)
Total Protein: 5.7 g/dL — ABNORMAL LOW (ref 6.5–8.1)

## 2023-11-05 LAB — CBC
HCT: 24.9 % — ABNORMAL LOW (ref 39.0–52.0)
Hemoglobin: 8.8 g/dL — ABNORMAL LOW (ref 13.0–17.0)
MCH: 38.1 pg — ABNORMAL HIGH (ref 26.0–34.0)
MCHC: 35.3 g/dL (ref 30.0–36.0)
MCV: 107.8 fL — ABNORMAL HIGH (ref 80.0–100.0)
Platelets: 52 10*3/uL — ABNORMAL LOW (ref 150–400)
RBC: 2.31 MIL/uL — ABNORMAL LOW (ref 4.22–5.81)
RDW: 15.9 % — ABNORMAL HIGH (ref 11.5–15.5)
WBC: 9.3 10*3/uL (ref 4.0–10.5)
nRBC: 0 % (ref 0.0–0.2)

## 2023-11-05 LAB — RESP PANEL BY RT-PCR (RSV, FLU A&B, COVID)  RVPGX2
Influenza A by PCR: NEGATIVE
Influenza B by PCR: NEGATIVE
Resp Syncytial Virus by PCR: NEGATIVE
SARS Coronavirus 2 by RT PCR: NEGATIVE

## 2023-11-05 LAB — PROTIME-INR
INR: 3.1 — ABNORMAL HIGH (ref 0.8–1.2)
Prothrombin Time: 32.5 s — ABNORMAL HIGH (ref 11.4–15.2)

## 2023-11-05 LAB — IGG: IgG (Immunoglobin G), Serum: 1601 mg/dL (ref 603–1613)

## 2023-11-05 LAB — AFP TUMOR MARKER: AFP, Serum, Tumor Marker: <1.8 ng/mL (ref 0.0–8.4)

## 2023-11-05 MED ORDER — OXYCODONE HCL ER 10 MG PO T12A
10.0000 mg | EXTENDED_RELEASE_TABLET | Freq: Two times a day (BID) | ORAL | Status: DC
  Administered 2023-11-05 – 2023-11-08 (×6): 10 mg via ORAL
  Filled 2023-11-05 (×7): qty 1

## 2023-11-05 MED ORDER — ACETYLCYSTEINE LOAD VIA INFUSION
150.0000 mg/kg | Freq: Once | INTRAVENOUS | Status: AC
Start: 2023-11-05 — End: 2023-11-05
  Administered 2023-11-05: 11670 mg via INTRAVENOUS
  Filled 2023-11-05: qty 383

## 2023-11-05 MED ORDER — ONDANSETRON 4 MG PO TBDP: 4.0000 mg | ORAL_TABLET | Freq: Three times a day (TID) | ORAL | Status: DC | PRN

## 2023-11-05 MED ORDER — HYDROMORPHONE HCL 1 MG/ML IJ SOLN: 0.5000 mg | INTRAMUSCULAR | Status: DC | PRN

## 2023-11-05 MED ORDER — TRAZODONE HCL 50 MG PO TABS: 50.0000 mg | ORAL_TABLET | Freq: Every evening | ORAL | Status: DC | PRN

## 2023-11-05 MED ORDER — VITAMIN K1 10 MG/ML IJ SOLN
10.0000 mg | Freq: Once | INTRAVENOUS | Status: AC
  Administered 2023-11-05: 10 mg via INTRAVENOUS
  Filled 2023-11-05: qty 1

## 2023-11-05 MED ORDER — DEXTROSE 5 % IV SOLN
12.5000 mg/kg/h | INTRAVENOUS | Status: AC
  Administered 2023-11-05: 12.5 mg/kg/h via INTRAVENOUS
  Filled 2023-11-05: qty 90

## 2023-11-05 MED ORDER — HYDROMORPHONE HCL 1 MG/ML IJ SOLN: 1.0000 mg | INTRAMUSCULAR | Status: DC | PRN

## 2023-11-05 MED ORDER — DEXTROSE 5 % IV SOLN
6.2500 mg/kg/h | INTRAVENOUS | Status: DC
  Administered 2023-11-06 – 2023-11-07 (×5): 6.25 mg/kg/h via INTRAVENOUS
  Filled 2023-11-05 (×3): qty 90

## 2023-11-05 MED ORDER — PROCHLORPERAZINE EDISYLATE 10 MG/2ML IJ SOLN
10.0000 mg | Freq: Four times a day (QID) | INTRAMUSCULAR | Status: DC | PRN
  Administered 2023-11-05: 10 mg via INTRAVENOUS
  Filled 2023-11-05: qty 2

## 2023-11-05 MED ORDER — SODIUM CHLORIDE 1 G PO TABS
1.0000 g | ORAL_TABLET | Freq: Two times a day (BID) | ORAL | Status: DC
  Administered 2023-11-05 – 2023-11-08 (×7): 1 g via ORAL
  Filled 2023-11-05 (×7): qty 1

## 2023-11-05 NOTE — Progress Notes (Addendum)
 Subjective: Patient reports some abdominal pain since his paracentesis yesterday.  States his appetite is slowly improving.  No nausea or vomiting.  Objective: Vital signs in last 24 hours: Temp:  [97.6 F (36.4 C)-98.2 F (36.8 C)] 98.2 F (36.8 C) (03/29 0434) Pulse Rate:  [60-72] 62 (03/29 0434) Resp:  [16-18] 18 (03/28 1430) BP: (118-145)/(47-60) 118/47 (03/29 0434) SpO2:  [96 %-99 %] 98 % (03/29 0434) Last BM Date : 11/05/23 General:   Alert and oriented, pleasant Head:  Normocephalic and atraumatic. Eyes:  Scleral icterus Abdomen:  Bowel sounds present, soft, non-tender, non-distended. No HSM or hernias noted. No rebound or guarding. No masses appreciated  Msk:  Symmetrical without gross deformities. Normal posture. Extremities:  Without clubbing or edema. Neurologic:  Alert and  oriented x4;  grossly normal neurologically. Skin:  Warm and dry, intact without significant lesions. Diffuse jaundice Cervical Nodes:  No significant cervical adenopathy. Psych:  Alert and cooperative. Normal mood and affect.  Intake/Output from previous day: 03/28 0701 - 03/29 0700 In: 720 [P.O.:720] Out: -  Intake/Output this shift: Total I/O In: 240 [P.O.:240] Out: -   Lab Results: Recent Labs    11/03/23 1302 11/04/23 0426 11/05/23 0430  WBC 15.0* 8.1 9.3  HGB 11.6* 8.6* 8.8*  HCT 33.0* 24.5* 24.9*  PLT 79* 53* 52*   BMET Recent Labs    11/03/23 1302 11/04/23 0426 11/05/23 0430  NA 122* 126* 126*  K 5.2* 5.2* 5.0  CL 94* 101 100  CO2 20* 22 21*  GLUCOSE 155* 94 120*  BUN 35* 36* 32*  CREATININE 1.89* 1.83* 1.53*  CALCIUM 8.8* 8.5* 8.6*   LFT Recent Labs    11/03/23 1614 11/04/23 0426 11/05/23 0430  PROT 6.5 5.7* 5.7*  ALBUMIN 1.6* 2.2* 2.5*  AST 126* 104* 104*  ALT 90* 73* 73*  ALKPHOS 130* 101 95  BILITOT 27.3* 26.7* 29.4*  BILIDIR 15.3*  --   --   IBILI 12.0*  --   --    PT/INR Recent Labs    11/04/23 0426 11/05/23 0430  LABPROT 36.1* 32.5*  INR  3.6* 3.1*   Hepatitis Panel Recent Labs    11/03/23 1614  HEPBSAG NON REACTIVE  HCVAB NON REACTIVE  HEPAIGM NON REACTIVE  HEPBIGM NON REACTIVE     Studies/Results: US Paracentesis Result Date: 11/04/2023 INDICATION: Patient with a history of cirrhosis admitted with hyperbilirubinemia and ascites. Interventional radiology asked to perform a diagnostic and therapeutic paracentesis with a 1 L max. EXAM: ULTRASOUND GUIDED PARACENTESIS MEDICATIONS: 1% lidocaine 10 mL COMPLICATIONS: None immediate. PROCEDURE: Informed written consent was obtained from the patient after a discussion of the risks, benefits and alternatives to treatment. A timeout was performed prior to the initiation of the procedure. Initial ultrasound scanning demonstrates a large amount of ascites within the right lower abdominal quadrant. The right lower abdomen was prepped and draped in the usual sterile fashion. 1% lidocaine was used for local anesthesia. Following this, a 19 gauge, 7-cm, Yueh catheter was introduced. An ultrasound image was saved for documentation purposes. The paracentesis was performed. The catheter was removed and a dressing was applied. The patient tolerated the procedure well without immediate post procedural complication. FINDINGS: A total of approximately 1 L of bright yellow fluid was removed. Samples were sent to the laboratory as requested by the clinical team. IMPRESSION: Successful ultrasound-guided paracentesis yielding 1 liters of peritoneal fluid. PLAN: If the patient eventually requires >/=2 paracenteses in a 30 day period, candidacy for formal evaluation  by the [facility] Interventional Radiology Portal Hypertension Clinic will be assessed. Electronically Signed   By: Marliss Coots M.D.   On: 11/04/2023 14:56   US ABDOMEN LIMITED WITH LIVER DOPPLER Result Date: 11/03/2023 CLINICAL DATA:  Right upper quadrant pain EXAM: DUPLEX ULTRASOUND OF LIVER TECHNIQUE: Color and duplex Doppler ultrasound was  performed to evaluate the hepatic in-flow and out-flow vessels. COMPARISON:  MRI from 10/18/2023 FINDINGS: Liver: Increase in echogenicity with mild nodularity and significant Peri hepatic ascites. No focal lesion, mass or intrahepatic biliary ductal dilatation. Main Portal Vein size: 1 cm cm Portal Vein Velocities: All portal vein branches are noted to be hepatopetal inflow. Main Prox:  22 cm/sec Main Mid: 17 cm/sec Main Dist:  8.7 cm/sec Right: 23.4 cm/sec Left: 19.2 cm/sec Hepatic Vein Velocities: All hepatic vein branches are noted to be hepatofugal in flow. Right:  33.7 cm/sec Middle:  35.4 cm/sec Left:  31.7 cm/sec IVC: Present and patent with normal respiratory phasicity. Hepatic Artery Velocity:  126.5 cm/sec Splenic Vein Velocity:  27.3 cm/sec Spleen: 11.6 cm x 4.5 cm x 11.0 cm with a total volume of 363 cm^3 (411 cm^3 is upper limit normal) Portal Vein Occlusion/Thrombus: No Splenic Vein Occlusion/Thrombus: No Ascites: Present Varices: Absent IMPRESSION: Moderate ascites is noted. Cirrhotic changes of liver are seen. No portal vein occlusion is seen. No significant hip paddle few both flow in the portal venous branches is noted. Electronically Signed   By: Alcide Clever M.D.   On: 11/03/2023 19:36   DG Chest Portable 1 View Result Date: 11/03/2023 CLINICAL DATA:  Fatigue EXAM: PORTABLE CHEST 1 VIEW COMPARISON:  10/17/2023 FINDINGS: Stable cardiomediastinal contours. Decreased bilateral pleural effusions with small residual effusion on the right. Mild right basilar atelectasis. No pneumothorax. IMPRESSION: Decreased bilateral pleural effusions with small residual effusion on the right. Electronically Signed   By: Duanne Guess D.O.   On: 11/03/2023 14:17    Assessment/Plan:  1.  Acute hepatitis in the setting of decompensated cirrhosis-etiology likely from recent sepsis bacteremia  Fortunately, patient has not progressed to acute liver failure, no signs of encephalopathy.  Paracentesis  11/04/2023, fluid studies negative for SBP, Gram stain without organisms, culture pending.  Other infectious workup unremarkable thus far.  [place] 11/03/23 with ascites and cirrhotic changes, portal vein patent.  MRI/MRCP 10/18/23 cirrhosis, portal hypertension.  No evidence of biliary obstruction. S/P CCY  Liver bx 11/02/22 chronic hepatitis, portal inflammation and cirrhosis, no evidence of autoimmune hepatitis.  Denies any herbal supplements including green tea extract, ashwagandha, turmeric.  Seldom uses acetaminophen.  Possible drug-induced liver injury from IV daptomycin which he completed on previous hospital admission.   Will check HSV, CMV.  EBV pending. Patient does note congestion with sputum production in the a.m. x 1 to 2 weeks.  Believes this was due to allergies.  Will swab for respiratory viral infections.  Start IV NAC for non acetaminophen induced DILI.  Continue to monitor daily LFTs and coags.  2.  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 Prior biopsy with no evidence of autoimmune hepatitis despite weakly positive ASMA and slightly elevated IgG.   MELD 3.0: 41 currently (driven by bilirubin and hyponatremia); previously 31 on 10/17/23 and 26 in February.   Grade 1 varices present on prior EGD. Not currently on BB as d/c'd after acute illness and bradycardia.    AFP normal 2 weeks ago.   Vitamin K IV today given elevated INR despite dose yesterday.  No HE present.  3.  Coagulopathy-likely due to to above.  Slightly improved today at 3.1.  Fortunately no encephalopathy.  Continue to monitor.  Prognosis remains guarded.  Hennie Duos. Marletta Lor, D.O. Gastroenterology and Hepatology [facility]   LOS: 2 days    11/05/2023, 1:04 PM

## 2023-11-05 NOTE — Progress Notes (Signed)
 PROGRESS NOTE    Patient: Jose Lawrence                            PCP: Ignatius Specking, MD                    DOB: 1941/07/14            DOA: 11/03/2023 [id number]             DOS: 11/05/2023, 9:26 AM   LOS: 2 days   Date of Service: The patient was seen and examined on 11/05/2023  Subjective:   The patient was seen and examined this morning sitting excessive pain and discomfort overnight due to abdominal pain and back pain in bed. Difficulty sleeping No other issues overnight hemodynamically stable -Worsening bilirubin level with jaundice  Brief Narrative:   Jose Lawrence  is a 83 y.o. male, with medical history significant of pulmonary embolism, DM II, BPH, hypothyroidism, colon Ca s/p resection/colostomy ( follows Dr Ellin Saba) and liver cirrhosis, prior strep agalactiae bacteremia in 05/2023, finished IV daptomycin 10/07/2023, recent hospitalization 3/10 until 3/11, for painless jaundice, CT and MRCP without obstructive or other acute biliary abnormality.. -Patient presents to ED today he was sent from GI office given secondary to complaints for generalized weakness, poor appetite, cannot eat, lethargic sleeping all day and skin has been more jaundiced.  Reports he has been holding on his walks because of his weakness, reports he is feeling tired all the time, has no appetite, did once last week, denies any chest pain, coffee-ground emesis, no blood with stools. -In ED his workup significant for sodium 122, potassium 5.2, chloride 94, glucose 155, creatinine is up to 1.89 from 0.85, albumin low at 1.8, alk phos elevated at 141, total bilirubin significantly elevated at 29.3, was 10.4 on 3/11, white blood cell count elevated at 15, hemoglobin at 11.6, from baseline 9.1 3/11, platelet count low at 79K, INR is pending, ultrasound is pending, [facility] hospitalist consulted to admit.     Assessment & Plan:   Principal Problem:   Liver failure (HCC) Active Problems:   Cirrhosis of  liver (HCC)   Acquired hypothyroidism   BPH (benign prostatic hyperplasia)   Acute renal failure 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)   Elevated LFTs   Autoimmune hepatitis (HCC)   Jaundice   Chronic idiopathic thrombocytopenia (HCC)       Decompensated liver failure/ Transaminitis /  Coagulopathy Hypoalbuminemia / Hyperbilirubinemia/ Jaundice  -Monitoring LFTs, no significant improvement-bilirubin still elevated -GI following closely -Continue octreotide, -Ammonia within normal limit, no indication for lactulose - Still seems to be volume depleted, agree with holding Aldactone, and Lasix  -Trend vitals, holding home medication of Coreg,  - -Avoid hepatotoxic medications -Due to elevated INR, another dose of vitamin K 10 mg given for coagulopathy   Ascites: -Upper quadrant ultrasound -positive abdominal fluid-ascites s/p paracentesis yielding 1 L -Following with body fluid cytology and culture -Withholding antibiotics for now  Transaminitis -Due to decompensated liver failure - No Signs of overt infection, afebrile, WBC improved from 15.0-8.1 this morning -UA negative for leukocyte esterase, rare bacteria, WBC 0-5, -Follow-up with blood cultures     Latest Ref Rng & Units 11/05/2023    4:30 AM 11/04/2023    4:26 AM 11/03/2023    4:14 PM  Hepatic Function  Total Protein 6.5 - 8.1 g/dL 5.7  5.7  6.5   Albumin  3.5 - 5.0 g/dL 2.5  2.2  1.6   AST 15 - 41 U/L 104  104  126   ALT 0 - 44 U/L 73  73  90   Alk Phosphatase 38 - 126 U/L 95  101  130   Total Bilirubin 0.0 - 1.2 mg/dL 16.1  09.6  04.5   Bilirubin, Direct 0.0 - 0.2 mg/dL   40.9    -For jaundice-hyper bilirubinemia -consider IV fluid unsure if patient can tolerate -Hepatitis panel nonreactive -INR 2.8, 3.6, 3.1    AKI and CKD stage III unspecified Concern for hepatorenal syndrome -POA-dehydrated, continue gentle IV fluid hydration with albumin -Concern for hepatorenal syndrome -With IV fluid  and ascites concerning for third spacing -UA with signs of section UA gravity 1.017  -Hold Aldactone and Lasix.  And Coreg   -Will continue with empirically on Albumin, Octreotide and Duradrin -Monitoring renal function, BUN/creatinine remained stable GFR 35, 37-stable   Hyponatremia -Sodium 122>>> proved to 126, 126 -Most likely due to volume depletion and dehydration -Lasix and Aldactone -on hold - Urine sodium - at 60 -Continue with IV normal saline -Adding sodium supplements   Hypotensive  -Due to dehydration, acute liver failure -Added midodrine -Monitoring BP closely   hyperkalemia -was given Lokelma, hold Aldactone -Will give another dose of Lokelma as potassium still at 5.2>> 5.0   Thrombocytopenia -Lower than baseline, SCD for DVT prophylaxis -Platelets 53, 52,   Diabetes mellitus, type II -Hold oral agent and keep an insulin sliding scale   Hypothyroidism -Continue Synthroid    Back pain/abdominal pain  -As needed p.o., IV analgesics added  ethics-patient is full code, given the above findings and comorbidities, patient prognosis poor.  Palliative care will be consulted     ----------------------------------------------------------------------------------------------------------------------------------------------- Nutritional status:  The patient's BMI is: Body mass index is 23.26 kg/m. I agree with the assessment and plan as outlined  ------------------------------------------------------------------------------------------------------------------------------------------------  DVT prophylaxis:  SCDs Start: 11/03/23 1603   Code Status:   Code Status: Full Code  Family Communication: Wife at bedside updated -Advance care planning has been discussed.   Admission status:   Status is: Inpatient Remains inpatient appropriate because: Patient declining, needing intervention, for acute on chronic liver failure   Disposition: From  - home              Planning for discharge in 1-2 days: to   Procedures:   No admission procedures for hospital encounter.   Antimicrobials:  Anti-infectives (From admission, onward)    None        Medication:   acidophilus  1 capsule Oral Daily   levothyroxine  50 mcg Oral QAC breakfast   midodrine  5 mg Oral TID WC   octreotide  100 mcg Intravenous Q8H   sodium chloride  1 g Oral BID WC   tamsulosin  0.4 mg Oral QPC supper    HYDROmorphone (DILAUDID) injection, oxyCODONE   Objective:   Vitals:   11/04/23 1430 11/04/23 1443 11/04/23 2100 11/05/23 0434  BP:  (!) 145/59 (!) 140/54 (!) 118/47  Pulse: 72 61 61 62  Resp: 18     Temp:  97.6 F (36.4 C) 98 F (36.7 C) 98.2 F (36.8 C)  TempSrc:  Oral Oral Oral  SpO2:  99% 96% 98%  Weight:      Height:        Intake/Output Summary (Last 24 hours) at 11/05/2023 0926 Last data filed at 11/04/2023 1844 Gross per 24 hour  Intake 720  ml  Output --  Net 720 ml   Filed Weights   11/03/23 1245 11/03/23 1751  Weight: 79.5 kg 77.8 kg     Physical examination:     General:  AAO x 3,  cooperative, no distress;   HEENT:  Icteric -normocephalic, PERRL, otherwise with in Normal limits   Neuro:  CNII-XII intact. , normal motor and sensation, reflexes intact   Lungs:   Clear to auscultation BL, Respirations unlabored,  No wheezes / crackles  Cardio:    S1/S2, RRR, No murmure, No Rubs or Gallops   Abdomen:  Soft, non-tender, bowel sounds active all four quadrants, no guarding or peritoneal signs.  Muscular  skeletal:  Limited exam -global generalized weaknesses - in bed, able to move all 4 extremities,   2+ pulses,  symmetric, +1 pitting edema  Skin:  No change in skin discoloration, jaundice dry, warm to touch, negative for any Rashes,  Wounds: Please see nursing documentation          ------------------------------------------------------------------------------------------------------------------------------------------     LABs:     Latest Ref Rng & Units 11/05/2023    4:30 AM 11/04/2023    4:26 AM 11/03/2023    1:02 PM  CBC  WBC 4.0 - 10.5 K/uL 9.3  8.1  15.0   Hemoglobin 13.0 - 17.0 g/dL 8.8  8.6  13.0   Hematocrit 39.0 - 52.0 % 24.9  24.5  33.0   Platelets 150 - 400 K/uL 52  53  79       Latest Ref Rng & Units 11/05/2023    4:30 AM 11/04/2023    4:26 AM 11/03/2023    4:14 PM  CMP  Glucose 70 - 99 mg/dL 865  94    BUN 8 - 23 mg/dL 32  36    Creatinine 7.84 - 1.24 mg/dL 6.96  2.95    Sodium 284 - 145 mmol/L 126  126    Potassium 3.5 - 5.1 mmol/L 5.0  5.2    Chloride 98 - 111 mmol/L 100  101    CO2 22 - 32 mmol/L 21  22    Calcium 8.9 - 10.3 mg/dL 8.6  8.5    Total Protein 6.5 - 8.1 g/dL 5.7  5.7  6.5   Total Bilirubin 0.0 - 1.2 mg/dL 13.2  44.0  10.2   Alkaline Phos 38 - 126 U/L 95  101  130   AST 15 - 41 U/L 104  104  126   ALT 0 - 44 U/L 73  73  90        Micro Results Recent Results (from the past 240 hours)  Blood culture (routine x 2)     Status: None (Preliminary result)   Collection Time: 11/03/23  4:14 PM   Specimen: BLOOD  Result Value Ref Range Status   Specimen Description BLOOD RIGHT ANTECUBITAL  Final   Special Requests   Final    BOTTLES DRAWN AEROBIC AND ANAEROBIC Blood Culture adequate volume   Culture   Final    NO GROWTH 2 DAYS Performed at [facility], [address] [place], [place] [address]    Report Status PENDING  Incomplete  Blood culture (routine x 2)     Status: None (Preliminary result)   Collection Time: 11/03/23  4:14 PM   Specimen: BLOOD  Result Value Ref Range Status   Specimen Description BLOOD BOTTLES DRAWN AEROBIC ONLY  Final   Special Requests   Final    Blood Culture  results may not be optimal due to an inadequate volume of blood received in culture bottles   Culture   Final    NO GROWTH 2 DAYS Performed at [facility], [address], [place], [place] [address]    Report Status PENDING  Incomplete  Gram stain     Status: None    Collection Time: 11/04/23  2:20 PM   Specimen: Ascitic  Result Value Ref Range Status   Specimen Description ASCITIC  Final   Special Requests NONE  Final   Gram Stain   Final    PLEURAL NO ORGANISMS SEEN WBC PRESENT,BOTH PMN AND MONONUCLEAR CYTOSPIN SMEAR Performed at [facility], [address], [place], [place] [address]    Report Status 11/04/2023 FINAL  Final  Culture, body fluid w Gram Stain-bottle     Status: None (Preliminary result)   Collection Time: 11/04/23  2:20 PM   Specimen: Ascitic  Result Value Ref Range Status   Specimen Description ASCITIC  Final   Special Requests BOTTLES DRAWN AEROBIC AND ANAEROBIC 10CC  Final   Culture   Final    NO GROWTH < 24 HOURS Performed at [facility], [address], [place], [place] [address]    Report Status PENDING  Incomplete    Radiology Reports US Paracentesis Result Date: 11/04/2023 INDICATION: Patient with a history of cirrhosis admitted with hyperbilirubinemia and ascites. Interventional radiology asked to perform a diagnostic and therapeutic paracentesis with a 1 L max. EXAM: ULTRASOUND GUIDED PARACENTESIS MEDICATIONS: 1% lidocaine 10 mL COMPLICATIONS: None immediate. PROCEDURE: Informed written consent was obtained from the patient after a discussion of the risks, benefits and alternatives to treatment. A timeout was performed prior to the initiation of the procedure. Initial ultrasound scanning demonstrates a large amount of ascites within the right lower abdominal quadrant. The right lower abdomen was prepped and draped in the usual sterile fashion. 1% lidocaine was used for local anesthesia. Following this, a 19 gauge, 7-cm, Yueh catheter was introduced. An ultrasound image was saved for documentation purposes. The paracentesis was performed. The catheter was removed and a dressing was applied. The patient tolerated the procedure well without immediate post procedural complication. FINDINGS: A total of approximately 1 L of  bright yellow fluid was removed. Samples were sent to the laboratory as requested by the clinical team. IMPRESSION: Successful ultrasound-guided paracentesis yielding 1 liters of peritoneal fluid. PLAN: If the patient eventually requires >/=2 paracenteses in a 30 day period, candidacy for formal evaluation by the [facility] Interventional Radiology Portal Hypertension Clinic will be assessed. Electronically Signed   By: Marliss Coots M.D.   On: 11/04/2023 14:56    SIGNED: Kendell Bane, MD, FHM. FAAFP. [facility] - [facility]ist Time spent - 55 min.  In seeing, evaluating and examining the patient. Reviewing medical records, labs, drawn plan of care. [facility],  Pager (please use [email or web address] to page/ text) Please use Epic Secure Chat for non-urgent communication (7AM-7PM)  If 7PM-7AM, please contact night-coverage [email or web address], 11/05/2023, 9:26 AM

## 2023-11-05 NOTE — Progress Notes (Signed)
   11/05/23 8295  Provider Notification  Provider Name/Title Dr. Antionette Char  Date Provider Notified 11/05/23  Time Provider Notified [phone]  Notification Reason Critical Result  Test performed and critical result total bili 29.4  Date Critical Result Received 11/05/23  Time Critical Result Received 0623  Provider response No new orders

## 2023-11-06 DIAGNOSIS — R188 Other ascites: Secondary | ICD-10-CM | POA: Diagnosis not present

## 2023-11-06 DIAGNOSIS — K7460 Unspecified cirrhosis of liver: Secondary | ICD-10-CM

## 2023-11-06 DIAGNOSIS — D689 Coagulation defect, unspecified: Secondary | ICD-10-CM | POA: Diagnosis not present

## 2023-11-06 DIAGNOSIS — I8500 Esophageal varices without bleeding: Secondary | ICD-10-CM | POA: Diagnosis not present

## 2023-11-06 DIAGNOSIS — K766 Portal hypertension: Secondary | ICD-10-CM | POA: Diagnosis not present

## 2023-11-06 DIAGNOSIS — K7290 Hepatic failure, unspecified without coma: Secondary | ICD-10-CM | POA: Diagnosis not present

## 2023-11-06 LAB — CBC
HCT: 25 % — ABNORMAL LOW (ref 39.0–52.0)
Hemoglobin: 8.8 g/dL — ABNORMAL LOW (ref 13.0–17.0)
MCH: 38.1 pg — ABNORMAL HIGH (ref 26.0–34.0)
MCHC: 35.2 g/dL (ref 30.0–36.0)
MCV: 108.2 fL — ABNORMAL HIGH (ref 80.0–100.0)
Platelets: 47 10*3/uL — ABNORMAL LOW (ref 150–400)
RBC: 2.31 MIL/uL — ABNORMAL LOW (ref 4.22–5.81)
RDW: 15.9 % — ABNORMAL HIGH (ref 11.5–15.5)
WBC: 11.8 10*3/uL — ABNORMAL HIGH (ref 4.0–10.5)
nRBC: 0 % (ref 0.0–0.2)

## 2023-11-06 LAB — COMPREHENSIVE METABOLIC PANEL WITH GFR
ALT: 70 U/L — ABNORMAL HIGH (ref 0–44)
AST: 92 U/L — ABNORMAL HIGH (ref 15–41)
Albumin: 2.7 g/dL — ABNORMAL LOW (ref 3.5–5.0)
Alkaline Phosphatase: 85 U/L (ref 38–126)
Anion gap: 11 (ref 5–15)
BUN: 34 mg/dL — ABNORMAL HIGH (ref 8–23)
CO2: 20 mmol/L — ABNORMAL LOW (ref 22–32)
Calcium: 9 mg/dL (ref 8.9–10.3)
Chloride: 98 mmol/L (ref 98–111)
Creatinine, Ser: 1.5 mg/dL — ABNORMAL HIGH (ref 0.61–1.24)
GFR, Estimated: 47 mL/min — ABNORMAL LOW (ref >60)
Glucose, Bld: 132 mg/dL — ABNORMAL HIGH (ref 70–99)
Potassium: 5.1 mmol/L (ref 3.5–5.1)
Sodium: 129 mmol/L — ABNORMAL LOW (ref 135–145)
Total Bilirubin: 32.1 mg/dL (ref 0.0–1.2)
Total Protein: 5.7 g/dL — ABNORMAL LOW (ref 6.5–8.1)

## 2023-11-06 LAB — PROTIME-INR
INR: 3.9 — ABNORMAL HIGH (ref 0.8–1.2)
Prothrombin Time: 38.4 s — ABNORMAL HIGH (ref 11.4–15.2)

## 2023-11-06 LAB — GLUCOSE, CAPILLARY
Glucose-Capillary: 149 mg/dL — ABNORMAL HIGH (ref 70–99)
Glucose-Capillary: 162 mg/dL — ABNORMAL HIGH (ref 70–99)

## 2023-11-06 MED ORDER — DEXTROSE 5 % IV SOLN
10.0000 mg | Freq: Once | INTRAVENOUS | Status: AC
  Administered 2023-11-06: 10 mg via INTRAVENOUS
  Filled 2023-11-06: qty 1

## 2023-11-06 MED ORDER — SODIUM ZIRCONIUM CYCLOSILICATE 10 G PO PACK
10.0000 g | PACK | Freq: Two times a day (BID) | ORAL | Status: DC
  Administered 2023-11-06 – 2023-11-08 (×5): 10 g via ORAL
  Filled 2023-11-06 (×5): qty 1

## 2023-11-06 NOTE — Plan of Care (Signed)
  Problem: Health Behavior/Discharge Planning: Goal: Ability to manage health-related needs will improve Outcome: Progressing   Problem: Clinical Measurements: Goal: Ability to maintain clinical measurements within normal limits will improve Outcome: Progressing Goal: Will remain free from infection Outcome: Progressing Goal: Diagnostic test results will improve Outcome: Progressing   Problem: Activity: Goal: Risk for activity intolerance will decrease Outcome: Progressing   Problem: Pain Managment: Goal: General experience of comfort will improve and/or be controlled Outcome: Progressing   Problem: Safety: Goal: Ability to remain free from injury will improve Outcome: Progressing   Problem: Skin Integrity: Goal: Risk for impaired skin integrity will decrease Outcome: Progressing

## 2023-11-06 NOTE — Progress Notes (Addendum)
 Subjective: Patient without complaints for me today.  States he ate a good breakfast.  Denies any confusion.  No abdominal pain.  Objective: Vital signs in last 24 hours: Temp:  [97.4 F (36.3 C)-98.1 F (36.7 C)] 97.9 F (36.6 C) (03/30 0438) Pulse Rate:  [60-80] 60 (03/30 0438) Resp:  [14-17] 14 (03/30 0438) BP: (115-133)/(59-62) 133/60 (03/30 0438) SpO2:  [93 %-98 %] 95 % (03/30 0438) Last BM Date : 11/06/23 General:   Alert and oriented, pleasant Head:  Normocephalic and atraumatic. Eyes:  Scleral icterus  Abdomen:  Bowel sounds present, soft, non-tender, non-distended. No HSM or hernias noted. No rebound or guarding. No masses appreciated  Msk:  Symmetrical without gross deformities. Normal posture. Extremities:  Without clubbing or edema. Neurologic:  Alert and  oriented x4;  grossly normal neurologically. Skin:  Warm and dry, intact without significant lesions. Diffuse jaundice Cervical Nodes:  No significant cervical adenopathy. Psych:  Alert and cooperative. Normal mood and affect.  Intake/Output from previous day: 03/29 0701 - 03/30 0700 In: 1509.6 [P.O.:240; I.V.:787.2; IV Piggyback:482.4] Out: -  Intake/Output this shift: No intake/output data recorded.  Lab Results: Recent Labs    11/04/23 0426 11/05/23 0430 11/06/23 0443  WBC 8.1 9.3 11.8*  HGB 8.6* 8.8* 8.8*  HCT 24.5* 24.9* 25.0*  PLT 53* 52* 47*   BMET Recent Labs    11/04/23 0426 11/05/23 0430 11/06/23 0443  NA 126* 126* 129*  K 5.2* 5.0 5.1  CL 101 100 98  CO2 22 21* 20*  GLUCOSE 94 120* 132*  BUN 36* 32* 34*  CREATININE 1.83* 1.53* 1.50*  CALCIUM 8.5* 8.6* 9.0   LFT Recent Labs    11/03/23 1614 11/04/23 0426 11/05/23 0430 11/06/23 0443  PROT 6.5 5.7* 5.7* 5.7*  ALBUMIN 1.6* 2.2* 2.5* 2.7*  AST 126* 104* 104* 92*  ALT 90* 73* 73* 70*  ALKPHOS 130* 101 95 85  BILITOT 27.3* 26.7* 29.4* 32.1*  BILIDIR 15.3*  --   --   --   IBILI 12.0*  --   --   --    PT/INR Recent Labs     11/05/23 0430 11/06/23 0813  LABPROT 32.5* 38.4*  INR 3.1* 3.9*   Hepatitis Panel Recent Labs    11/03/23 1614  HEPBSAG NON REACTIVE  HCVAB NON REACTIVE  HEPAIGM NON REACTIVE  HEPBIGM NON REACTIVE     Studies/Results: US Paracentesis Result Date: 11/04/2023 INDICATION: Patient with a history of cirrhosis admitted with hyperbilirubinemia and ascites. Interventional radiology asked to perform a diagnostic and therapeutic paracentesis with a 1 L max. EXAM: ULTRASOUND GUIDED PARACENTESIS MEDICATIONS: 1% lidocaine 10 mL COMPLICATIONS: None immediate. PROCEDURE: Informed written consent was obtained from the patient after a discussion of the risks, benefits and alternatives to treatment. A timeout was performed prior to the initiation of the procedure. Initial ultrasound scanning demonstrates a large amount of ascites within the right lower abdominal quadrant. The right lower abdomen was prepped and draped in the usual sterile fashion. 1% lidocaine was used for local anesthesia. Following this, a 19 gauge, 7-cm, Yueh catheter was introduced. An ultrasound image was saved for documentation purposes. The paracentesis was performed. The catheter was removed and a dressing was applied. The patient tolerated the procedure well without immediate post procedural complication. FINDINGS: A total of approximately 1 L of bright yellow fluid was removed. Samples were sent to the laboratory as requested by the clinical team. IMPRESSION: Successful ultrasound-guided paracentesis yielding 1 liters of peritoneal fluid. PLAN: If the  patient eventually requires >/=2 paracenteses in a 30 day period, candidacy for formal evaluation by the [facility] Interventional Radiology Portal Hypertension Clinic will be assessed. Electronically Signed   By: Marliss Coots M.D.   On: 11/04/2023 14:56    Assessment/Plan:  1.  Acute on chronic liver failure-etiology likely from recent sepsis bacteremia.  Worsening INR 3.9  today.  Paracentesis 11/04/2023, fluid studies negative for SBP, Gram stain without organisms, culture NG so far.  Other infectious workup unremarkable.  [place] 11/03/23 with ascites and cirrhotic changes, portal vein patent.  MRI/MRCP 10/18/23 cirrhosis, portal hypertension.  No evidence of biliary obstruction. S/P [facility]  Liver bx 11/02/22 chronic hepatitis, portal inflammation and cirrhosis, no evidence of autoimmune hepatitis.  Denies any herbal supplements including green tea extract, ashwagandha, turmeric.  Seldom uses acetaminophen.  Less likely drug-induced liver injury from IV daptomycin which he completed on previous hospital admission.  Currently on IV N-acetylcysteine x 3 days.  Respiratory swab unremarkable.  HSV, EBV, CMV pending.  Continue to monitor daily LFTs and coags.  Spoke with hepatology from [facility], agree this is likely acute on chronic liver failure from recent sepsis.  Would not recommend steroids given recent infection.  Agreed with current management, recommended end-of-life discussions.  Had a long conversation with patient and his wife due to concern for worsening coagulopathy, high probability this will progress to fulminant liver failure and death.  Palliative consult has been made.  2.  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 Prior biopsy with no evidence of autoimmune hepatitis despite weakly positive ASMA and slightly elevated IgG.   MELD 3.0: 39    Grade 1 varices present on prior EGD. Not currently on BB as d/c'd after acute illness and bradycardia.    AFP normal 2 weeks ago.   Vitamin K IV today given elevated INR despite dose yesterday.  No HE present.   3.  Coagulopathy-due to acute on chronic liver failure.  Worsening today at 3.9.  Will give another dose of IV vitamin K.  Prognosis remains guarded.  Jose Lawrence. Marletta Lor, D.O. Gastroenterology and Hepatology [facility]   LOS: 3 days    11/06/2023, 11:51 AM

## 2023-11-06 NOTE — Progress Notes (Signed)
 This nurse was called into the room and patient and wife had questions about palliative care. This nurse educated patient and wife about the palliative process, and explained that palliative will be in tomorrow to come discuss it further. Notified Dr. Marletta Lor and Dr. Laural Benes about conversation. Dr. Marletta Lor then asked what was patients code status. Wife and patient then had questions about what a full code was vs a DNR. After education, patient explained that he wishes to be a DNR and stated "I don't want to prolong my life, when its my time to go I will be ready." Wife at bedside and was understanding of patients wishes. No further questions at this time. Notified Dr. Marletta Lor and Laural Benes. Pt now changed to a DNR status.

## 2023-11-06 NOTE — Progress Notes (Signed)
 PROGRESS NOTE   Jose Lawrence  WUJ:[id number] DOB: May 15, 1941 DOA: 11/03/2023 PCP: Ignatius Specking, MD   Chief Complaint  Patient presents with   Weakness   Level of care: Telemetry  Brief Admission History:  83 y.o. male with medical history significant of pulmonary embolism, DM II, BPH, hypothyroidism, colon Ca s/p resection/colostomy (follows Dr Ellin Saba) and liver cirrhosis, prior strep agalactiae bacteremia in 05/2023, finished IV daptomycin 10/07/2023, recent hospitalization 3/10 until 3/11 for painless jaundice, CT and MRCP without obstructive or other acute biliary abnormality presented to ED sent from GI office given secondary to complaints for generalized weakness, poor appetite, not eating or drinking, lethargic with sleeping all day and skin has been more jaundiced.  Reports he has been holding on his walks because of his weakness, reports he is feeling tired all the time, has no appetite, did once last week, denies any chest pain, coffee-ground emesis, no blood with stools. -In ED his workup significant for sodium 122, potassium 5.2, chloride 94, glucose 155, creatinine is up to 1.89 from 0.85, albumin low at 1.8, alk phos elevated at 141, total bilirubin significantly elevated at 29.3, was 10.4 on 3/11, white blood cell count elevated at 15, hemoglobin at 11.6, from baseline 9.1 3/11, platelet count low at 79K, INR is pending, ultrasound is pending, [facility] hospitalist consulted to admit.   Hospital Course by listed problems     Decompensated liver failure/ Transaminitis /  Coagulopathy Hypoalbuminemia / Hyperbilirubinemia/ Jaundice  -Monitoring LFTs, no significant improvement-bilirubin still elevated -GI following closely -Continue octreotide, IV acetylcysteine per GI team -Ammonia within normal limit, no indication for lactulose -was volume depleted temporarily holding Aldactone, and Lasix -Trend vitals, holding home medication of Coreg,  -Avoid hepatotoxic  medications -Due to elevated INR, another dose of vitamin K 10 mg given for coagulopathy  Ascites: -Upper quadrant ultrasound -positive abdominal fluid-ascites s/p paracentesis yielding 1 L -Following with body fluid cytology and culture -Withholding antibiotics for now  Transaminitis -Due to decompensated liver failure -No Signs of overt infection, afebrile, WBC improved from 15.0-8.1 this morning -UA negative for leukocyte esterase, rare bacteria, WBC 0-5 -Follow-up with blood cultures: NGTD     Latest Ref Rng & Units 11/06/2023    4:43 AM 11/05/2023    4:30 AM 11/04/2023    4:26 AM  Hepatic Function  Total Protein 6.5 - 8.1 g/dL 5.7  5.7  5.7   Albumin 3.5 - 5.0 g/dL 2.7  2.5  2.2   AST 15 - 41 U/L 92  104  104   ALT 0 - 44 U/L 70  73  73   Alk Phosphatase 38 - 126 U/L 85  95  101   Total Bilirubin 0.0 - 1.2 mg/dL 95.6  21.3  08.6    -For jaundice-hyperbilirubinemia -reducing rate of IV fluid -Hepatitis panel nonreactive -INR 2.8, 3.6, 3.1  AKI and CKD stage III unspecified Concern for hepatorenal syndrome -POA-dehydrated, continue gentle IV fluid hydration with albumin -Concern for hepatorenal syndrome -With IV fluid and ascites concerning for third spacing -UA with signs of section UA gravity 1.017  -Hold Aldactone and Lasix.  And Coreg  -Will continue with empirically on Albumin, Octreotide and Duradrin -Monitoring renal function, BUN/creatinine remained stable GFR 35, 37-stable   Hyponatremia -Sodium 122>>129 -Most likely due to volume depletion and dehydration -Lasix and Aldactone -on hold -Urine sodium - at 60 -Continue with IVF -sodium supplements   Hypotensive - improved   -Due to dehydration, acute liver failure -Added  midodrine -Monitoring BP closely  hyperkalemia -was given Lokelma, hold Aldactone -lokelma BID ordered -- follow labs    Thrombocytopenia -Lower than baseline, SCD for DVT prophylaxis -Platelets 53, 52,   Diabetes mellitus, type  II -currently diet controlled, follow CBGs   Hypothyroidism -Continue Synthroid  Back pain/abdominal pain  -As needed p.o., IV analgesics added  ethics-patient is full code, given the above findings and comorbidities, patient prognosis poor.  Palliative care will be consulted    DVT prophylaxis: INR>3 Code Status: Full  Family Communication: wife at bedside  Disposition: TBD    Consultants:  GI Palliative medicine  Procedures:   Antimicrobials:    Subjective: Pt says he does not feel bad, no severe pruritus symptoms.   Objective: Vitals:   11/05/23 1430 11/05/23 2009 11/05/23 2305 11/06/23 0438  BP: (!) 131/59 133/62 115/60 133/60  Pulse: 63 80 69 60  Resp: 17 14 16 14   Temp: 98.1 F (36.7 C) 97.7 F (36.5 C) (!) 97.4 F (36.3 C) 97.9 F (36.6 C)  TempSrc: Oral Oral Oral Oral  SpO2: 98% 93% 93% 95%  Weight:      Height:        Intake/Output Summary (Last 24 hours) at 11/06/2023 1137 Last data filed at 11/05/2023 1700 Gross per 24 hour  Intake 1269.61 ml  Output --  Net 1269.61 ml   Filed Weights   11/03/23 1245 11/03/23 1751  Weight: 79.5 kg 77.8 kg   Examination:  General exam: Appears calm and comfortable. NAD.  Respiratory system: Clear to auscultation. Respiratory effort normal. Cardiovascular system: normal S1 & S2 heard. No JVD, murmurs, rubs, gallops or clicks. No pedal edema. Gastrointestinal system: Abdomen is mildly distended, soft and generally tender. No masses felt. Normal bowel sounds heard. Central nervous system: Alert and oriented. No focal neurological deficits. Extremities: Symmetric 5 x 5 power. Skin: severely jaundiced.  Psychiatry: Judgement and insight appear normal. Mood & affect appropriate.   Data Reviewed: I have personally reviewed following labs and imaging studies  CBC: Recent Labs  Lab 11/03/23 1302 11/04/23 0426 11/05/23 0430 11/06/23 0443  WBC 15.0* 8.1 9.3 11.8*  NEUTROABS 13.0*  --   --   --   HGB 11.6*  8.6* 8.8* 8.8*  HCT 33.0* 24.5* 24.9* 25.0*  MCV 107.8* 107.5* 107.8* 108.2*  PLT 79* 53* 52* 47*    Basic Metabolic Panel: Recent Labs  Lab 11/03/23 1302 11/04/23 0426 11/05/23 0430 11/06/23 0443  NA 122* 126* 126* 129*  K 5.2* 5.2* 5.0 5.1  CL 94* 101 100 98  CO2 20* 22 21* 20*  GLUCOSE 155* 94 120* 132*  BUN 35* 36* 32* 34*  CREATININE 1.89* 1.83* 1.53* 1.50*  CALCIUM 8.8* 8.5* 8.6* 9.0    CBG: No results for input(s): "GLUCAP" in the last 168 hours.  Recent Results (from the past 240 hours)  Blood culture (routine x 2)     Status: None (Preliminary result)   Collection Time: 11/03/23  4:14 PM   Specimen: BLOOD  Result Value Ref Range Status   Specimen Description BLOOD RIGHT ANTECUBITAL  Final   Special Requests   Final    BOTTLES DRAWN AEROBIC AND ANAEROBIC Blood Culture adequate volume   Culture   Final    NO GROWTH 2 DAYS Performed at [facility], [address] [place], [place] [address]    Report Status PENDING  Incomplete  Blood culture (routine x 2)     Status: None (Preliminary result)   Collection  Time: 11/03/23  4:14 PM   Specimen: BLOOD  Result Value Ref Range Status   Specimen Description BLOOD BOTTLES DRAWN AEROBIC ONLY  Final   Special Requests   Final    Blood Culture results may not be optimal due to an inadequate volume of blood received in culture bottles   Culture   Final    NO GROWTH 2 DAYS Performed at [facility], [address] [place], [place] [address]    Report Status PENDING  Incomplete  Gram stain     Status: None   Collection Time: 11/04/23  2:20 PM   Specimen: Ascitic  Result Value Ref Range Status   Specimen Description ASCITIC  Final   Special Requests NONE  Final   Gram Stain   Final    PLEURAL NO ORGANISMS SEEN WBC PRESENT,BOTH PMN AND MONONUCLEAR CYTOSPIN SMEAR Performed at [facility], [address], [place], [place] [address]    Report Status 11/04/2023 FINAL  Final  Culture, body fluid w Gram Stain-bottle      Status: None (Preliminary result)   Collection Time: 11/04/23  2:20 PM   Specimen: Ascitic  Result Value Ref Range Status   Specimen Description ASCITIC  Final   Special Requests BOTTLES DRAWN AEROBIC AND ANAEROBIC 10CC  Final   Culture   Final    NO GROWTH < 24 HOURS Performed at [facility], [address] [place], [place] [address]    Report Status PENDING  Incomplete  Resp panel by RT-PCR (RSV, Flu A&B, Covid) Anterior Nasal Swab     Status: None   Collection Time: 11/05/23  1:09 PM   Specimen: Anterior Nasal Swab  Result Value Ref Range Status   SARS Coronavirus 2 by RT PCR NEGATIVE NEGATIVE Final    Comment: (NOTE) SARS-CoV-2 target nucleic acids are NOT DETECTED.  The SARS-CoV-2 RNA is generally detectable in upper respiratory specimens during the acute phase of infection. The lowest concentration of SARS-CoV-2 viral copies this assay can detect is 138 copies/mL. A negative result does not preclude SARS-Cov-2 infection and should not be used as the sole basis for treatment or other patient management decisions. A negative result may occur with  improper specimen collection/handling, submission of specimen other than nasopharyngeal swab, presence of viral mutation(s) within the areas targeted by this assay, and inadequate number of viral copies(<138 copies/mL). A negative result must be combined with clinical observations, patient history, and epidemiological information. The expected result is Negative.  Fact Sheet for Patients:  BloggerCourse.com  Fact Sheet for Healthcare Providers:  SeriousBroker.it  This test is no t yet approved or cleared by the [place] FDA and  has been authorized for detection and/or diagnosis of SARS-CoV-2 by FDA under an Emergency Use Authorization (EUA). This EUA will remain  in effect (meaning this test can be used) for the duration of the COVID-19 declaration under Section 564(b)(1)  of the Act, 21 U.S.C.section 360bbb-3(b)(1), unless the authorization is terminated  or revoked sooner.       Influenza A by PCR NEGATIVE NEGATIVE Final   Influenza B by PCR NEGATIVE NEGATIVE Final    Comment: (NOTE) The Xpert Xpress SARS-CoV-2/FLU/RSV plus assay is intended as an aid in the diagnosis of influenza from Nasopharyngeal swab specimens and should not be used as a sole basis for treatment. Nasal washings and aspirates are unacceptable for Xpert Xpress SARS-CoV-2/FLU/RSV testing.  Fact Sheet for Patients: BloggerCourse.com  Fact Sheet for Healthcare Providers: SeriousBroker.it  This test is not yet  approved or cleared by the [place] and has been authorized for detection and/or diagnosis of SARS-CoV-2 by FDA under an Emergency Use Authorization (EUA). This EUA will remain in effect (meaning this test can be used) for the duration of the COVID-19 declaration under Section 564(b)(1) of the Act, 21 U.S.C. section 360bbb-3(b)(1), unless the authorization is terminated or revoked.     Resp Syncytial Virus by PCR NEGATIVE NEGATIVE Final    Comment: (NOTE) Fact Sheet for Patients: BloggerCourse.com  Fact Sheet for Healthcare Providers: SeriousBroker.it  This test is not yet approved or cleared by the [place] FDA and has been authorized for detection and/or diagnosis of SARS-CoV-2 by FDA under an Emergency Use Authorization (EUA). This EUA will remain in effect (meaning this test can be used) for the duration of the COVID-19 declaration under Section 564(b)(1) of the Act, 21 U.S.C. section 360bbb-3(b)(1), unless the authorization is terminated or revoked.  Performed at [facility], [address] [place], [place] [address]      Radiology Studies: US Paracentesis Result Date: 11/04/2023 INDICATION: Patient with a history of cirrhosis admitted with  hyperbilirubinemia and ascites. Interventional radiology asked to perform a diagnostic and therapeutic paracentesis with a 1 L max. EXAM: ULTRASOUND GUIDED PARACENTESIS MEDICATIONS: 1% lidocaine 10 mL COMPLICATIONS: None immediate. PROCEDURE: Informed written consent was obtained from the patient after a discussion of the risks, benefits and alternatives to treatment. A timeout was performed prior to the initiation of the procedure. Initial ultrasound scanning demonstrates a large amount of ascites within the right lower abdominal quadrant. The right lower abdomen was prepped and draped in the usual sterile fashion. 1% lidocaine was used for local anesthesia. Following this, a 19 gauge, 7-cm, Yueh catheter was introduced. An ultrasound image was saved for documentation purposes. The paracentesis was performed. The catheter was removed and a dressing was applied. The patient tolerated the procedure well without immediate post procedural complication. FINDINGS: A total of approximately 1 L of bright yellow fluid was removed. Samples were sent to the laboratory as requested by the clinical team. IMPRESSION: Successful ultrasound-guided paracentesis yielding 1 liters of peritoneal fluid. PLAN: If the patient eventually requires >/=2 paracenteses in a 30 day period, candidacy for formal evaluation by the [facility] Inter[facility] Portal Hypertension Clinic will be assessed. Electronically Signed   By: Marliss Coots M.D.   On: 11/04/2023 14:56    Scheduled Meds:  acidophilus  1 capsule Oral Daily   levothyroxine  50 mcg Oral QAC breakfast   midodrine  5 mg Oral TID WC   octreotide  100 mcg Intravenous Q8H   oxyCODONE  10 mg Oral Q12H   sodium chloride  1 g Oral BID WC   tamsulosin  0.4 mg Oral QPC supper   Continuous Infusions:  sodium chloride 100 mL/hr at 11/06/23 1006   acetylcysteine 6.25 mg/kg/hr (11/06/23 1006)   albumin human 12.5 g (11/06/23 0842)   phytonadione (VITAMIN K) 10 mg in  dextrose 5 % 50 mL IVPB 10 mg (11/06/23 1046)     LOS: 3 days   Time spent: 56 mins  Leanza Shepperson Laural Benes, MD How to contact the [facility] Attending or Consulting provider 7A - 7P or covering provider during after hours 7P -7A, for this patient?  Check the care team in [facility] and look for a) attending/consulting [facility] provider listed and b) the [facility] team listed Log into [email or web address] to find provider on call.  Locate the [facility] provider you are looking for under [facility]  and page to a number that you can be directly reached. If you still have difficulty reaching the provider, please page the [facility] (Director on Call) for the Hospitalists listed on amion for assistance.  11/06/2023, 11:37 AM

## 2023-11-06 NOTE — Plan of Care (Signed)
  Problem: Health Behavior/Discharge Planning: Goal: Ability to manage health-related needs will improve Outcome: Progressing   Problem: Clinical Measurements: Goal: Ability to maintain clinical measurements within normal limits will improve Outcome: Progressing Goal: Will remain free from infection Outcome: Progressing Goal: Diagnostic test results will improve Outcome: Progressing   Problem: Activity: Goal: Risk for activity intolerance will decrease Outcome: Progressing   Problem: Nutrition: Goal: Adequate nutrition will be maintained Outcome: Progressing   Problem: Pain Managment: Goal: General experience of comfort will improve and/or be controlled Outcome: Progressing   Problem: Safety: Goal: Ability to remain free from injury will improve Outcome: Progressing   Problem: Skin Integrity: Goal: Risk for impaired skin integrity will decrease Outcome: Progressing

## 2023-11-07 ENCOUNTER — Encounter (HOSPITAL_COMMUNITY): Payer: Self-pay | Admitting: Internal Medicine

## 2023-11-07 DIAGNOSIS — Z7189 Other specified counseling: Secondary | ICD-10-CM | POA: Diagnosis not present

## 2023-11-07 DIAGNOSIS — K7200 Acute and subacute hepatic failure without coma: Secondary | ICD-10-CM | POA: Diagnosis not present

## 2023-11-07 DIAGNOSIS — I8500 Esophageal varices without bleeding: Secondary | ICD-10-CM | POA: Diagnosis not present

## 2023-11-07 DIAGNOSIS — K7290 Hepatic failure, unspecified without coma: Secondary | ICD-10-CM | POA: Diagnosis not present

## 2023-11-07 DIAGNOSIS — K766 Portal hypertension: Secondary | ICD-10-CM | POA: Diagnosis not present

## 2023-11-07 DIAGNOSIS — R7989 Other specified abnormal findings of blood chemistry: Secondary | ICD-10-CM | POA: Diagnosis not present

## 2023-11-07 DIAGNOSIS — K7460 Unspecified cirrhosis of liver: Secondary | ICD-10-CM | POA: Diagnosis not present

## 2023-11-07 DIAGNOSIS — D689 Coagulation defect, unspecified: Secondary | ICD-10-CM | POA: Diagnosis not present

## 2023-11-07 DIAGNOSIS — Z515 Encounter for palliative care: Secondary | ICD-10-CM | POA: Diagnosis not present

## 2023-11-07 LAB — CBC
HCT: 23.9 % — ABNORMAL LOW (ref 39.0–52.0)
Hemoglobin: 8.1 g/dL — ABNORMAL LOW (ref 13.0–17.0)
MCH: 37.3 pg — ABNORMAL HIGH (ref 26.0–34.0)
MCHC: 33.9 g/dL (ref 30.0–36.0)
MCV: 110.1 fL — ABNORMAL HIGH (ref 80.0–100.0)
Platelets: 47 10*3/uL — ABNORMAL LOW (ref 150–400)
RBC: 2.17 MIL/uL — ABNORMAL LOW (ref 4.22–5.81)
RDW: 16.2 % — ABNORMAL HIGH (ref 11.5–15.5)
WBC: 11.7 10*3/uL — ABNORMAL HIGH (ref 4.0–10.5)
nRBC: 0 % (ref 0.0–0.2)

## 2023-11-07 LAB — GLUCOSE, CAPILLARY
Glucose-Capillary: 110 mg/dL — ABNORMAL HIGH (ref 70–99)
Glucose-Capillary: 117 mg/dL — ABNORMAL HIGH (ref 70–99)
Glucose-Capillary: 121 mg/dL — ABNORMAL HIGH (ref 70–99)

## 2023-11-07 LAB — PROTIME-INR
INR: 4.2 (ref 0.8–1.2)
Prothrombin Time: 40.6 s — ABNORMAL HIGH (ref 11.4–15.2)

## 2023-11-07 MED ORDER — DEXTROSE 5 % IV SOLN
10.0000 mg | Freq: Once | INTRAVENOUS | Status: AC
  Administered 2023-11-07: 10 mg via INTRAVENOUS
  Filled 2023-11-07: qty 1

## 2023-11-07 NOTE — Plan of Care (Signed)
  Problem: Nutrition: Goal: Adequate nutrition will be maintained Outcome: Progressing   Problem: Nutrition: Goal: Adequate nutrition will be maintained Outcome: Progressing   

## 2023-11-07 NOTE — Consult Note (Signed)
 Consultation Note Date: 11/07/2023   Patient Name: Jose Lawrence  DOB: May 23, 1941  MRN: [id number]  Age / Sex: 83 y.o., male  PCP: Jose Specking, MD Referring Physician: Cleora Fleet, MD  Reason for Consultation: Establishing goals of care  HPI/Patient Profile: 83 y.o. male  with past medical history of f pulmonary embolism, type 2 diabetes mellitus, BPH, hypothyroidism, colon ca sp resection/colostomy ( follows Dr Jose Lawrence) and liver cirrhosis, prior strep agalactiae bacteremia in 05/2023, finished IV daptomycin 10/07/2023  admitted on 11/03/2023 with decompensated liver failure.   Clinical Assessment and Goals of Care: Face-to-face conference with bedside nursing staff related to patient condition, needs, goals of care.  I have reviewed medical records including EPIC notes, labs and imaging, received report from RN, assessed the patient.  Jose Lawrence is sitting up quietly in bed.  He appears acutely/chronically ill and quite frail.  He greets me, making and somewhat keeping eye contact.  He is alert and oriented x 2, able to make his basic needs known.  His wife of 60 years, Jose Lawrence along with daughters Jose Lawrence and Jose Lawrence, and son Jose Lawrence are at bedside.   We meet at the bedside to discuss diagnosis prognosis, GOC, EOL wishes, disposition and options.  I introduced Palliative Medicine as specialized medical care for people living with serious illness. It focuses on providing relief from the symptoms and stress of a serious illness. The goal is to improve quality of life for both the patient and the family.  We discussed a brief life review of the patient.  Mr. and Mrs. Jose Lawrence had been married 60 years.  They have 3 children.  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    We then focused on their current illness.  Overall, [name] and his family are quite knowledgeable about his acute health  concerns and the treatment plan.  We talk in detail about GI visit and recommendations hospitalist visit and recommendations.  We talked about what we can and cannot change.  We talked about the benefits of hospice care.  The natural disease trajectory and expectations at EOL were discussed.  Family asks about prognosis.  We talked about indicators such as functional status, eating and drinking less interacting less.  I shared that current MELD score is 42 which indicates 6.9% estimated 90-day survival.    I attempted to elicit values and goals of care important to the patient.  The difference between aggressive medical intervention and comfort care was considered in light of the patient's goals of care.  We talked about preferred place of death.  We talked about rehospitalization.  [name] states that at this point, he does not think he would return to the hospital.  Advanced directives, concepts specific to code status, artifical feeding and hydration, and rehospitalization were considered and discussed.  DNR verified.  Hospice and Palliative Care services outpatient were explained and offered.  We talked about the benefits of "treat the treatable" hospice care.  We talked about the benefits of support in the  home.  Discussed the importance of continued conversation with family and the medical providers regarding overall plan of care and treatment options, ensuring decisions are within the context of the patient's values and GOCs.  Questions and concerns were addressed.  The family was encouraged to call with questions or concerns.  PMT will continue to support holistically.  Conference with attending, bedside nursing staff, transition of care team related to patient condition, needs, goals of care, disposition.    HCPOA    NEXT OF KIN -wife of 60 years, Jose Lawrence    SUMMARY OF RECOMMENDATIONS   Home with the benefit of [facility] hospice No equipment needed at this  time Considering do not rehospitalize Anticipate transitioning to full comfort care within the next few weeks    Code Status/Advance Care Planning: DNR  Symptom Management:  Per hospitalist, no additional needs at this time. No morphine for kidney failure  Palliative Prophylaxis:  Frequent Pain Assessment and Oral Care  Additional Recommendations (Limitations, Scope, Preferences): Home with the benefit of hospice, anticipate comfort care within the next 2 weeks  Psycho-social/Spiritual:  Desire for further Chaplaincy support:no Additional Recommendations: Caregiving  Support/Resources, Education on Hospice, and Grief/Bereavement Support  Prognosis:  < 6 weeks anticipated based on MELD score 42 and decreasing functional status  Discharge Planning: Home with Hospice      Primary Diagnoses: Present on Admission:  Liver failure (HCC)  Acquired hypothyroidism  Acute renal failure 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)  Autoimmune hepatitis (HCC)  BPH (benign prostatic hyperplasia)  Chronic idiopathic thrombocytopenia (HCC)  Decompensated cirrhosis (HCC)  Elevated LFTs  Jaundice   I have reviewed the medical record, interviewed the patient and family, and examined the patient. The following aspects are pertinent.  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 Lita Mains   PE (pulmonary embolism)    Temporarily on Eliquis 2017   Skin cancer    35 years ago   Type 2 diabetes mellitus (HCC)    Vitamin B 12 deficiency 07/10/2019   Social History   Socioeconomic History   Marital status: Married    Spouse name: Not on file   Number of children: Not on file   Years of  education: Not on file   Highest education level: Not on file  Occupational History   Occupation: Holiday representative    Comment: [employer]  Tobacco Use   Smoking status: Former    Current packs/day: 0.00    Average packs/day: 1 pack/day for 27.0 years (27.0 ttl pk-yrs)    Types: Cigarettes    Start date: 01/27/1964    Quit date: 01/27/1991    Years since quitting: 32.8   Smokeless tobacco: Former    Types: Chew    Quit date: 08/10/1995  Vaping Use   Vaping status: Never Used  Substance and Sexual Activity   Alcohol use: Not Currently   Drug use: Never   Sexual activity: Not Currently  Other Topics Concern   Not on file  Social History Narrative   Not on file   Social Drivers of Health   Financial Resource Strain: Low Risk  (09/01/2020)  Overall Financial Resource Strain (CARDIA)    Difficulty of Paying Living Expenses: Not hard at all  Food Insecurity: No Food Insecurity (11/03/2023)   Hunger Vital Sign    Worried About Running Out of Food in the Last Year: Never true    Ran Out of Food in the Last Year: Never true  Transportation Needs: No Transportation Needs (11/03/2023)   PRAPARE - Administrator, Civil Service (Medical): No    Lack of Transportation (Non-Medical): No  Physical Activity: Sufficiently Active (09/01/2020)   Exercise Vital Sign    Days of Exercise per Week: 7 days    Minutes of Exercise per Session: 30 min  Stress: No Stress Concern Present (09/01/2020)   [employer] of Occupational Health - Occupational Stress Questionnaire    Feeling of Stress : Not at all  Social Connections: Socially Integrated (11/03/2023)   Social Connection and Isolation Panel [NHANES]    Frequency of Communication with Friends and Family: More than three times a week    Frequency of Social Gatherings with Friends and Family: More than three times a week    Attends Religious Services: More than 4 times per year    Active Member of [employer] or Organizations: Yes    Attends  Engineer, structural: More than 4 times per year    Marital Status: Married   Family History  Problem Relation Age of Onset   Heart disease Father    Heart failure Father    Heart attack Father    Prostate cancer Paternal Uncle    Scheduled Meds:  acidophilus  1 capsule Oral Daily   levothyroxine  50 mcg Oral QAC breakfast   midodrine  5 mg Oral TID WC   octreotide  100 mcg Intravenous Q8H   oxyCODONE  10 mg Oral Q12H   sodium chloride  1 g Oral BID WC   sodium zirconium cyclosilicate  10 g Oral BID   tamsulosin  0.4 mg Oral QPC supper   Continuous Infusions:  sodium chloride 50 mL/hr at 11/06/23 1804   acetylcysteine 6.25 mg/kg/hr (11/06/23 1804)   albumin human 25 g (11/07/23 0905)   PRN Meds:.HYDROmorphone (DILAUDID) injection, ondansetron, oxyCODONE, prochlorperazine, traZODone Medications Prior to Admission:  Prior to Admission medications   Medication Sig Start Date End Date Taking? Authorizing Provider  acidophilus (RISAQUAD) CAPS capsule Take 1 capsule by mouth daily.   Yes [provider]  carvedilol (COREG) 3.125 MG tablet Take 3.125 mg by mouth 2 (two) times daily. 11/01/23  Yes [provider]  furosemide (LASIX) 20 MG tablet Take 1 tablet (20 mg total) by mouth daily. 10/18/23 10/17/24 Yes Dorcas Carrow, MD  lactose free nutrition (BOOST) LIQD Take 237 mLs by mouth daily at 6 (six) AM.   Yes [provider]  levothyroxine (SYNTHROID, LEVOTHROID) 50 MCG tablet Take 50 mcg by mouth daily before breakfast.   Yes [provider]  loperamide (IMODIUM) 2 MG capsule Take 2 capsules (4 mg total) by mouth 4 (four) times daily -  before meals and at bedtime. 11/11/18  Yes Lucretia Roers, MD  spironolactone (ALDACTONE) 50 MG tablet Take 1 tablet (50 mg total) by mouth daily. 10/18/23 10/17/24 Yes Dorcas Carrow, MD  tamsulosin (FLOMAX) 0.4 MG CAPS capsule Take 1 capsule (0.4 mg total) by mouth daily after supper. 06/10/23  Yes Shon Hale, MD  Vitamin D, Ergocalciferol, (DRISDOL) 1.25 MG (50000 UNIT) CAPS capsule Take 1 capsule by mouth once a week 09/05/23  Yes Doreatha Massed, MD   No Known Allergies Review of Systems  Unable to perform ROS: Age    Physical Exam Vitals and nursing note reviewed.  Constitutional:      General: He is not in acute distress.    Appearance: He is ill-appearing.  Cardiovascular:     Rate and Rhythm: Normal rate.  Pulmonary:     Effort: Pulmonary effort is normal. No respiratory distress.  Skin:    Coloration: Skin is jaundiced.  Neurological:     Mental Status: He is alert and oriented to person, place, and time.  Psychiatric:     Comments: Calm and cooperative     Vital Signs: BP 130/61 (BP Location: Left Arm)   Pulse 89   Temp 98.3 F (36.8 C) (Oral)   Resp 16   Ht 6' (1.829 m)   Wt 77.8 kg   SpO2 94%   BMI 23.26 kg/m  Pain Scale: 0-10   Pain Score: 0-No pain   SpO2: SpO2: 94 % O2 Device:SpO2: 94 % O2 Flow Rate: .   IO: Intake/output summary:  Intake/Output Summary (Last 24 hours) at 11/07/2023 5621 Last data filed at 11/07/2023 3086 Gross per 24 hour  Intake 2580.93 ml  Output --  Net 2580.93 ml    LBM: Last BM Date : 11/06/23 Baseline Weight: Weight: 79.5 kg Most recent weight: Weight: 77.8 kg     Palliative Assessment/Data:     Time In: 1030   Time Out: 1145 Time Total: 75 minutes  Greater than 50%  of this time was spent counseling and coordinating care related to the above assessment and plan.  Signed by: Katheran Awe, NP   Please contact Palliative Medicine Team phone at [phone] for questions and concerns.  For individual provider: See Loretha Stapler

## 2023-11-07 NOTE — Progress Notes (Signed)
 CRITICAL VALUE STICKER  CRITICAL VALUE: INR 4.2  RECEIVER (on-site recipient of call): Marvell Fuller, RN   DATE & TIME NOTIFIED: 11/07/23 at [address] (representative from lab): Donney Dice  MD NOTIFIED: Dr. Standley Dakins  TIME OF NOTIFICATION: 11/07/23 at 1318  RESPONSE:  pending

## 2023-11-07 NOTE — Progress Notes (Signed)
 PROGRESS NOTE   Jose Lawrence  XLK:[id number] DOB: November 14, 1940 DOA: 11/03/2023 PCP: Ignatius Specking, MD   Chief Complaint  Patient presents with   Weakness   Level of care: Telemetry  Brief Admission History:  83 y.o. male with medical history significant of pulmonary embolism, DM II, BPH, hypothyroidism, colon Ca s/p resection/colostomy (follows Dr Ellin Saba) and liver cirrhosis, prior strep agalactiae bacteremia in 05/2023, finished IV daptomycin 10/07/2023, recent hospitalization 3/10 until 3/11 for painless jaundice, CT and MRCP without obstructive or other acute biliary abnormality presented to ED sent from GI office given secondary to complaints for generalized weakness, poor appetite, not eating or drinking, lethargic with sleeping all day and skin has been more jaundiced.  Reports he has been holding on his walks because of his weakness, reports he is feeling tired all the time, has no appetite, did once last week, denies any chest pain, coffee-ground emesis, no blood with stools. -In ED his workup significant for sodium 122, potassium 5.2, chloride 94, glucose 155, creatinine is up to 1.89 from 0.85, albumin low at 1.8, alk phos elevated at 141, total bilirubin significantly elevated at 29.3, was 10.4 on 3/11, white blood cell count elevated at 15, hemoglobin at 11.6, from baseline 9.1 3/11, platelet count low at 79K, INR is pending, ultrasound is pending, [facility] hospitalist consulted to admit.  Hospital Course by listed problems     Decompensated liver failure/ Transaminitis /  Coagulopathy Hypoalbuminemia / Hyperbilirubinemia/ Jaundice  -Monitoring LFTs, no significant improvement-bilirubin still elevated -GI following closely -pt has been treated with octreotide and IV acetylcysteine per GI team -Ammonia within normal limit, no indication for lactulose -was volume depleted temporarily holding Aldactone, and Lasix -Trend vitals, holding home medication of Coreg  -Avoid  hepatotoxic medications -Due to elevated INR vitamin K 10 mg given for coagulopathy per GI team -discussed with Dr. Marletta Lor, he spoke with hepatologist at tertiary care center, there is nothing further to offer and palliative medicine consultation was recommended.  Awaiting for palliative medicine consult.  -pt decided on DNR after speaking with GI and RN on 3/30   Ascites: -Upper quadrant ultrasound -positive abdominal fluid-ascites s/p paracentesis yielding 1 L -Following with body fluid cytology and culture -Withholding antibiotics for now  Transaminitis -Due to decompensated liver failure -No Signs of overt infection, afebrile, WBC improved from 15.0-8.1 this morning -UA negative for leukocyte esterase, rare bacteria, WBC 0-5 -Follow-up with blood cultures: NGTD     Latest Ref Rng & Units 11/06/2023    4:43 AM 11/05/2023    4:30 AM 11/04/2023    4:26 AM  Hepatic Function  Total Protein 6.5 - 8.1 g/dL 5.7  5.7  5.7   Albumin 3.5 - 5.0 g/dL 2.7  2.5  2.2   AST 15 - 41 U/L 92  104  104   ALT 0 - 44 U/L 70  73  73   Alk Phosphatase 38 - 126 U/L 85  95  101   Total Bilirubin 0.0 - 1.2 mg/dL 36.6  44.0  34.7    -For jaundice-hyperbilirubinemia -reducing rate of IV fluid -Hepatitis panel nonreactive -INR 2.8, 3.6, 3.1  AKI and CKD stage III unspecified Concern for hepatorenal syndrome -POA-dehydrated, continue gentle IV fluid hydration with albumin -Concern for hepatorenal syndrome -With IV fluid and ascites concerning for third spacing -UA with signs of section UA gravity 1.017 -Hold Aldactone and Lasix.  And Coreg -creatinine bumping today and that worries me that he is developing HRS  Hyponatremia -Sodium 122>>129 -Most likely due to volume depletion and dehydration -Lasix and Aldactone -on hold -Urine sodium - at 60 -Continue with IVF -sodium supplements   Hypotensive - improved   -Due to dehydration, acute liver failure -Added midodrine -Monitoring BP  closely  hyperkalemia -was given Lokelma, hold Aldactone -lokelma BID ordered -- follow labs    Thrombocytopenia -Lower than baseline, SCD for DVT prophylaxis -Platelets 53, 52,   Diabetes mellitus, type II -currently diet controlled, follow CBGs   Hypothyroidism -Continue Synthroid  Back pain/abdominal pain  -As needed p.o., IV analgesics added  ethics-patient is full code, given the above findings and comorbidities, patient prognosis poor.  Palliative care will be consulted   DVT prophylaxis: INR>3 Code Status: Full  Family Communication: wife at bedside  Disposition: TBD    Consultants:  GI Palliative medicine  Procedures:   Antimicrobials:    Subjective: Pt wanting to go home.  No other specific complaints.   Objective: Vitals:   11/06/23 0438 11/06/23 1349 11/07/23 0213 11/07/23 0459  BP: 133/60 120/68 134/66 130/61  Pulse: 60 82 86 89  Resp: 14 16 20 16   Temp: 97.9 F (36.6 C) 98.6 F (37 C) 98.4 F (36.9 C) 98.3 F (36.8 C)  TempSrc: Oral Oral Oral Oral  SpO2: 95% 97% 94% 94%  Weight:      Height:        Intake/Output Summary (Last 24 hours) at 11/07/2023 0454 Last data filed at 11/07/2023 0427 Gross per 24 hour  Intake 2580.93 ml  Output --  Net 2580.93 ml   Filed Weights   11/03/23 1245 11/03/23 1751  Weight: 79.5 kg 77.8 kg   Examination:  General exam: appears severely jaundiced, he is mentating normally; does not appear to be in distress. Hard of hearing.  Respiratory system: Clear to auscultation. Respiratory effort normal. Cardiovascular system: normal S1 & S2 heard. No JVD, murmurs, rubs, gallops or clicks. No pedal edema. Gastrointestinal system: Abdomen is mildly distended, soft and generally tender. No masses felt. Normal bowel sounds heard. Central nervous system: Alert and oriented. No focal neurological deficits. Extremities: Symmetric 5 x 5 power. Skin: severely jaundiced.  Psychiatry: Judgement and insight appear normal.  Mood & affect appropriate.   Data Reviewed: I have personally reviewed following labs and imaging studies  CBC: Recent Labs  Lab 11/03/23 1302 11/04/23 0426 11/05/23 0430 11/06/23 0443 11/07/23 0439  WBC 15.0* 8.1 9.3 11.8* 11.7*  NEUTROABS 13.0*  --   --   --   --   HGB 11.6* 8.6* 8.8* 8.8* 8.1*  HCT 33.0* 24.5* 24.9* 25.0* 23.9*  MCV 107.8* 107.5* 107.8* 108.2* 110.1*  PLT 79* 53* 52* 47* 47*    Basic Metabolic Panel: Recent Labs  Lab 11/03/23 1302 11/04/23 0426 11/05/23 0430 11/06/23 0443 11/07/23 0439  NA 122* 126* 126* 129* 130*  K 5.2* 5.2* 5.0 5.1 4.9  CL 94* 101 100 98 100  CO2 20* 22 21* 20* 19*  GLUCOSE 155* 94 120* 132* 126*  BUN 35* 36* 32* 34* 43*  CREATININE 1.89* 1.83* 1.53* 1.50* 1.95*  CALCIUM 8.8* 8.5* 8.6* 9.0 9.5    CBG: Recent Labs  Lab 11/06/23 1608 11/06/23 2019 11/07/23 0730  GLUCAP 149* 162* 110*    Recent Results (from the past 240 hours)  Blood culture (routine x 2)     Status: None (Preliminary result)   Collection Time: 11/03/23  4:14 PM   Specimen: BLOOD  Result Value Ref Range Status  Specimen Description BLOOD RIGHT ANTECUBITAL  Final   Special Requests   Final    BOTTLES DRAWN AEROBIC AND ANAEROBIC Blood Culture adequate volume   Culture   Final    NO GROWTH 4 DAYS Performed at [facility], [address], [place], [place] [address]    Report Status PENDING  Incomplete  Blood culture (routine x 2)     Status: None (Preliminary result)   Collection Time: 11/03/23  4:14 PM   Specimen: BLOOD  Result Value Ref Range Status   Specimen Description BLOOD BOTTLES DRAWN AEROBIC ONLY  Final   Special Requests   Final    Blood Culture results may not be optimal due to an inadequate volume of blood received in culture bottles   Culture   Final    NO GROWTH 4 DAYS Performed at [facility], [address] [place], [place] [address]    Report Status PENDING  Incomplete  Gram stain     Status: None   Collection Time: 11/04/23   2:20 PM   Specimen: Ascitic  Result Value Ref Range Status   Specimen Description ASCITIC  Final   Special Requests NONE  Final   Gram Stain   Final    PLEURAL NO ORGANISMS SEEN WBC PRESENT,BOTH PMN AND MONONUCLEAR CYTOSPIN SMEAR Performed at [facility], [address], [place], [place] [address]    Report Status 11/04/2023 FINAL  Final  Culture, body fluid w Gram Stain-bottle     Status: None (Preliminary result)   Collection Time: 11/04/23  2:20 PM   Specimen: Ascitic  Result Value Ref Range Status   Specimen Description ASCITIC  Final   Special Requests BOTTLES DRAWN AEROBIC AND ANAEROBIC 10CC  Final   Culture   Final    NO GROWTH 3 DAYS Performed at [facility], [address], [place], [place] [address]    Report Status PENDING  Incomplete  Resp panel by RT-PCR (RSV, Flu A&B, Covid) Anterior Nasal Swab     Status: None   Collection Time: 11/05/23  1:09 PM   Specimen: Anterior Nasal Swab  Result Value Ref Range Status   SARS Coronavirus 2 by RT PCR NEGATIVE NEGATIVE Final    Comment: (NOTE) SARS-CoV-2 target nucleic acids are NOT DETECTED.  The SARS-CoV-2 RNA is generally detectable in upper respiratory specimens during the acute phase of infection. The lowest concentration of SARS-CoV-2 viral copies this assay can detect is 138 copies/mL. A negative result does not preclude SARS-Cov-2 infection and should not be used as the sole basis for treatment or other patient management decisions. A negative result may occur with  improper specimen collection/handling, submission of specimen other than nasopharyngeal swab, presence of viral mutation(s) within the areas targeted by this assay, and inadequate number of viral copies(<138 copies/mL). A negative result must be combined with clinical observations, patient history, and epidemiological information. The expected result is Negative.  Fact Sheet for Patients:  BloggerCourse.com  Fact Sheet for  Healthcare Providers:  SeriousBroker.it  This test is no t yet approved or cleared by the [place] FDA and  has been authorized for detection and/or diagnosis of SARS-CoV-2 by FDA under an Emergency Use Authorization (EUA). This EUA will remain  in effect (meaning this test can be used) for the duration of the COVID-19 declaration under Section 564(b)(1) of the Act, 21 U.S.C.section 360bbb-3(b)(1), unless the authorization is terminated  or revoked sooner.       Influenza A by PCR NEGATIVE NEGATIVE Final  Influenza B by PCR NEGATIVE NEGATIVE Final    Comment: (NOTE) The Xpert Xpress SARS-CoV-2/FLU/RSV plus assay is intended as an aid in the diagnosis of influenza from Nasopharyngeal swab specimens and should not be used as a sole basis for treatment. Nasal washings and aspirates are unacceptable for Xpert Xpress SARS-CoV-2/FLU/RSV testing.  Fact Sheet for Patients: BloggerCourse.com  Fact Sheet for Healthcare Providers: SeriousBroker.it  This test is not yet approved or cleared by the [place] FDA and has been authorized for detection and/or diagnosis of SARS-CoV-2 by FDA under an Emergency Use Authorization (EUA). This EUA will remain in effect (meaning this test can be used) for the duration of the COVID-19 declaration under Section 564(b)(1) of the Act, 21 U.S.C. section 360bbb-3(b)(1), unless the authorization is terminated or revoked.     Resp Syncytial Virus by PCR NEGATIVE NEGATIVE Final    Comment: (NOTE) Fact Sheet for Patients: BloggerCourse.com  Fact Sheet for Healthcare Providers: SeriousBroker.it  This test is not yet approved or cleared by the [place] FDA and has been authorized for detection and/or diagnosis of SARS-CoV-2 by FDA under an Emergency Use Authorization (EUA). This EUA will remain in effect (meaning this  test can be used) for the duration of the COVID-19 declaration under Section 564(b)(1) of the Act, 21 U.S.C. section 360bbb-3(b)(1), unless the authorization is terminated or revoked.  Performed at [facility], [address] [place], [place] [address]      Radiology Studies: No results found.   Scheduled Meds:  acidophilus  1 capsule Oral Daily   levothyroxine  50 mcg Oral QAC breakfast   midodrine  5 mg Oral TID WC   octreotide  100 mcg Intravenous Q8H   oxyCODONE  10 mg Oral Q12H   sodium chloride  1 g Oral BID WC   sodium zirconium cyclosilicate  10 g Oral BID   tamsulosin  0.4 mg Oral QPC supper   Continuous Infusions:  sodium chloride 50 mL/hr at 11/06/23 1804   acetylcysteine 6.25 mg/kg/hr (11/06/23 1804)   albumin human 25 g (11/07/23 0229)     LOS: 4 days   Time spent: 50 mins  Sharel Behne Laural Benes, MD How to contact the [facility] Attending or Consulting provider 7A - 7P or covering provider during after hours 7P -7A, for this patient?  Check the care team in [facility] and look for a) attending/consulting TRH provider listed and b) the [facility] team listed Log into [email or web address] to find provider on call.  Locate the [facility] provider you are looking for under [facility] and page to a number that you can be directly reached. If you still have difficulty reaching the provider, please page the [facility] (Director on Call) for the Hospitalists listed on amion for assistance.  11/07/2023, 9:03 AM

## 2023-11-07 NOTE — Consult Note (Signed)
 [facility] Liaison Note  11/07/2023  [name] March 25, 1941 [id number]  Location: RN Hospital Liaison screened the patient remotely at [facility].  Insurance: Medicare   Jose Lawrence is a 83 y.o. male who is a Primary Care Patient of Vyas, Angelina Pih, MD [facility]. The patient was screened for 30 day readmission hospitalization with noted extreme risk score for unplanned readmission risk with 4 IP in 6 months.  The patient was assessed for potential Care Management service needs for post hospital transition for care coordination. Review of patient's electronic medical record reveals patient was admitted for Liver Failure. Pt will discharge home with hospice with [facility]. Facility will continue to address pt's ongoing needs.    [facility] Care Management/Population Health does not replace or interfere with any arrangements made by the Inpatient Transition of Care team.   For questions contact:   Elliot Cousin, RN, BSN Hospital Liaison [place]   [facility], Population Health Office Hours MTWF  8:00 am-6:00 pm Direct Dial: [phone] mobile [email or web address]

## 2023-11-07 NOTE — TOC Progression Note (Signed)
 Transition of Care [facility]) - Progression Note    Patient Details  Name: Jose Lawrence MRN: [id number] Date of Birth: Mar 30, 1941  Transition of [facility]) CM/SW Contact  Elliot Gault, LCSW Phone Number: 11/07/2023, 2:19 PM  Clinical Narrative:     TOC following. Palliative APNP met with pt and family today to assist with goals of care discussion. Received update from APNP that pt/family requesting referral to [facility] for hospice care at home upon dc.   Referral made and hospice team will follow up with pt's daughter as requested. Anticipating dc home tomorrow.  TOC will follow.  Expected Discharge Plan: Home w Hospice Care Barriers to Discharge: Continued Medical Work up  Expected Discharge Plan and Services In-house Referral: Clinical Social Work Discharge Planning Services: CM Consult Post Acute Care Choice: Hospice Living arrangements for the past 2 months: Single Family Home                                       Social Determinants of Health (SDOH) Interventions SDOH Screenings   Food Insecurity: No Food Insecurity (11/03/2023)  Housing: Low Risk  (11/03/2023)  Transportation Needs: No Transportation Needs (11/03/2023)  Utilities: Not At Risk (11/03/2023)  Alcohol Screen: Low Risk  (09/01/2020)  Depression (PHQ2-9): Low Risk  (09/01/2020)  Financial Resource Strain: Low Risk  (09/01/2020)  Physical Activity: Sufficiently Active (09/01/2020)  Social Connections: Socially Integrated (11/03/2023)  Stress: No Stress Concern Present (09/01/2020)  Tobacco Use: Medium Risk (11/07/2023)    Readmission Risk Interventions    11/03/2023    8:45 PM 10/18/2023   11:41 AM 09/26/2023   11:29 AM  Readmission Risk Prevention Plan  Transportation Screening Complete Complete   PCP or Specialist Appt within 5-7 Days   Complete  Home Care Screening   Complete  Medication Review (RN CM)   Complete  HRI or Home Care Consult  Complete   Social Work Consult for  Recovery Care Planning/Counseling  Complete   Palliative Care Screening  Not Applicable   Medication Review Oceanographer) Complete Complete   PCP or Specialist appointment within 3-5 days of discharge Complete    HRI or Home Care Consult Complete    SW Recovery Care/Counseling Consult Complete    Palliative Care Screening Not Applicable    Skilled Nursing Facility Not Applicable

## 2023-11-07 NOTE — Plan of Care (Signed)
  Problem: Pain Managment: Goal: General experience of comfort will improve and/or be controlled Outcome: Adequate for Discharge

## 2023-11-07 NOTE — Progress Notes (Cosign Needed Addendum)
 Gastroenterology Progress Note     Patient ID: Jose Lawrence; [id number]; 1940-10-28    Subjective   Family at bedside (wife and daughter). Awaiting 2 additional children to come to be with patient and wife today during palliative care discussions. No N/V. Appetite waxes and wanes. No overt GI bleeding. No mental status changes or confusion. He notes abdominal pain with palpation. Bloating when eating.    Objective   Vital signs in last 24 hours Temp:  [98.3 F (36.8 C)-98.6 F (37 C)] 98.3 F (36.8 C) (03/31 0459) Pulse Rate:  [82-89] 89 (03/31 0459) Resp:  [16-20] 16 (03/31 0459) BP: (120-134)/(61-68) 130/61 (03/31 0459) SpO2:  [94 %-97 %] 94 % (03/31 0459) Last BM Date : 11/06/23  Physical Exam General:   Alert and oriented, frail, jaundiced, chronically ill-appearing Head:  Normocephalic and atraumatic. Eyes:  +scleral icterus Abdomen:  Bowel sounds present, distended but soft, TTP diffusely, ileostomy in place with brown liquid stool, stoma pink Extremities:  With 1+ lower extremity edema. Neurologic:  Alert and  oriented x4;  negative asterixis    Intake/Output from previous day: 03/30 0701 - 03/31 0700 In: 2580.9 [P.O.:480; I.V.:1506; IV Piggyback:594.9] Out: -  Intake/Output this shift: Total I/O In: 240 [P.O.:240] Out: -   Lab Results  Recent Labs    11/05/23 0430 11/06/23 0443 11/07/23 0439  WBC 9.3 11.8* 11.7*  HGB 8.8* 8.8* 8.1*  HCT 24.9* 25.0* 23.9*  PLT 52* 47* 47*   BMET Recent Labs    11/05/23 0430 11/06/23 0443 11/07/23 0439  NA 126* 129* 130*  K 5.0 5.1 4.9  CL 100 98 100  CO2 21* 20* 19*  GLUCOSE 120* 132* 126*  BUN 32* 34* 43*  CREATININE 1.53* 1.50* 1.95*  CALCIUM 8.6* 9.0 9.5   LFT Recent Labs    11/05/23 0430 11/06/23 0443 11/07/23 0439  PROT 5.7* 5.7* 5.9*  ALBUMIN 2.5* 2.7* 3.2*  AST 104* 92* 71*  ALT 73* 70* 59*  ALKPHOS 95 85 78  BILITOT 29.4* 32.1* 32.3*   PT/INR Recent Labs    11/05/23 0430  11/06/23 0813  LABPROT 32.5* 38.4*  INR 3.1* 3.9*     Studies/Results US Paracentesis Result Date: 11/04/2023 INDICATION: Patient with a history of cirrhosis admitted with hyperbilirubinemia and ascites. Interventional radiology asked to perform a diagnostic and therapeutic paracentesis with a 1 L max. EXAM: ULTRASOUND GUIDED PARACENTESIS MEDICATIONS: 1% lidocaine 10 mL COMPLICATIONS: None immediate. PROCEDURE: Informed written consent was obtained from the patient after a discussion of the risks, benefits and alternatives to treatment. A timeout was performed prior to the initiation of the procedure. Initial ultrasound scanning demonstrates a large amount of ascites within the right lower abdominal quadrant. The right lower abdomen was prepped and draped in the usual sterile fashion. 1% lidocaine was used for local anesthesia. Following this, a 19 gauge, 7-cm, Yueh catheter was introduced. An ultrasound image was saved for documentation purposes. The paracentesis was performed. The catheter was removed and a dressing was applied. The patient tolerated the procedure well without immediate post procedural complication. FINDINGS: A total of approximately 1 L of bright yellow fluid was removed. Samples were sent to the laboratory as requested by the clinical team. IMPRESSION: Successful ultrasound-guided paracentesis yielding 1 liters of peritoneal fluid. PLAN: If the patient eventually requires >/=2 paracenteses in a 30 day period, candidacy for formal evaluation by the [facility] will be assessed. Electronically Signed   By: Marliss Coots  M.D.   On: 11/04/2023 14:56   US ABDOMEN LIMITED WITH LIVER DOPPLER Result Date: 11/03/2023 CLINICAL DATA:  Right upper quadrant pain EXAM: DUPLEX ULTRASOUND OF LIVER TECHNIQUE: Color and duplex Doppler ultrasound was performed to evaluate the hepatic in-flow and out-flow vessels. COMPARISON:  MRI from 10/18/2023  FINDINGS: Liver: Increase in echogenicity with mild nodularity and significant Peri hepatic ascites. No focal lesion, mass or intrahepatic biliary ductal dilatation. Main Portal Vein size: 1 cm cm Portal Vein Velocities: All portal vein branches are noted to be hepatopetal inflow. Main Prox:  22 cm/sec Main Mid: 17 cm/sec Main Dist:  8.7 cm/sec Right: 23.4 cm/sec Left: 19.2 cm/sec Hepatic Vein Velocities: All hepatic vein branches are noted to be hepatofugal in flow. Right:  33.7 cm/sec Middle:  35.4 cm/sec Left:  31.7 cm/sec IVC: Present and patent with normal respiratory phasicity. Hepatic Artery Velocity:  126.5 cm/sec Splenic Vein Velocity:  27.3 cm/sec Spleen: 11.6 cm x 4.5 cm x 11.0 cm with a total volume of 363 cm^3 (411 cm^3 is upper limit normal) Portal Vein Occlusion/Thrombus: No Splenic Vein Occlusion/Thrombus: No Ascites: Present Varices: Absent IMPRESSION: Moderate ascites is noted. Cirrhotic changes of liver are seen. No portal vein occlusion is seen. No significant hip paddle few both flow in the portal venous branches is noted. Electronically Signed   By: Alcide Clever M.D.   On: 11/03/2023 19:36   DG Chest Portable 1 View Result Date: 11/03/2023 CLINICAL DATA:  Fatigue EXAM: PORTABLE CHEST 1 VIEW COMPARISON:  10/17/2023 FINDINGS: Stable cardiomediastinal contours. Decreased bilateral pleural effusions with small residual effusion on the right. Mild right basilar atelectasis. No pneumothorax. IMPRESSION: Decreased bilateral pleural effusions with small residual effusion on the right. Electronically Signed   By: Duanne Guess D.O.   On: 11/03/2023 14:17   MR ABDOMEN MRCP W WO CONTAST Result Date: 10/18/2023 CLINICAL DATA:  Jaundice.  History of colon cancer. EXAM: MRI ABDOMEN WITHOUT AND WITH CONTRAST (INCLUDING MRCP) TECHNIQUE: Multiplanar multisequence MR imaging of the abdomen was performed both before and after the administration of intravenous contrast. Heavily T2-weighted images of the  biliary and pancreatic ducts were obtained, and three-dimensional MRCP images were rendered by post processing. CONTRAST:  7mL GADAVIST GADOBUTROL 1 MMOL/ML IV SOLN COMPARISON:  CT AP from 10/17/2023 FINDINGS: Lower chest: Moderate right and small left pleural effusions. Hepatobiliary: The liver is small with a diffusely nodular contour compatible with cirrhosis. No enhancing liver lesions identified following contrast administration. The gallbladder is surgically absent. Although the MRCP images are nondiagnostic due to motion degradation, there is no intrahepatic or extrahepatic bile duct dilatation. Common bile duct is within normal limits measuring 5 mm. Pancreas:  No signs of main duct dilatation or mass. Spleen:  Normal in size no focal abnormality. Adrenals/Urinary Tract: Normal adrenal glands. There are 2 subcentimeter T2 hyperintense cyst arising off the lateral cortex of the right kidney measuring up to 6 mm. These are compatible with benign Bosniak class 2 cyst. No follow-up imaging recommended. Stomach/Bowel: Stomach appears normal. No pathologic dilatation of the large or small bowel loops. Right abdominal ileostomy. Previous colectomy. Vascular/Lymphatic: Normal caliber of the abdominal aorta. Small esophageal and perisplenic varices identified.No adenopathy identified. Other: Moderate volume of abdominal ascites. No discrete fluid collections identified. Musculoskeletal: No suspicious bone lesions identified. IMPRESSION: 1. Cirrhosis of the liver. 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 2. Moderate right and small left pleural effusions. 3. Previous colectomy with right abdominal ileostomy. Electronically Signed   By:  Signa Kell M.D.   On: 10/18/2023 11:35   MR 3D Recon At Scanner Result Date: 10/18/2023 CLINICAL DATA:  Jaundice.  History of colon cancer. EXAM: MRI ABDOMEN WITHOUT AND WITH CONTRAST (INCLUDING MRCP) TECHNIQUE: Multiplanar multisequence MR  imaging of the abdomen was performed both before and after the administration of intravenous contrast. Heavily T2-weighted images of the biliary and pancreatic ducts were obtained, and three-dimensional MRCP images were rendered by post processing. CONTRAST:  7mL GADAVIST GADOBUTROL 1 MMOL/ML IV SOLN COMPARISON:  CT AP from 10/17/2023 FINDINGS: Lower chest: Moderate right and small left pleural effusions. Hepatobiliary: The liver is small with a diffusely nodular contour compatible with cirrhosis. No enhancing liver lesions identified following contrast administration. The gallbladder is surgically absent. Although the MRCP images are nondiagnostic due to motion degradation, there is no intrahepatic or extrahepatic bile duct dilatation. Common bile duct is within normal limits measuring 5 mm. Pancreas:  No signs of main duct dilatation or mass. Spleen:  Normal in size no focal abnormality. Adrenals/Urinary Tract: Normal adrenal glands. There are 2 subcentimeter T2 hyperintense cyst arising off the lateral cortex of the right kidney measuring up to 6 mm. These are compatible with benign Bosniak class 2 cyst. No follow-up imaging recommended. Stomach/Bowel: Stomach appears normal. No pathologic dilatation of the large or small bowel loops. Right abdominal ileostomy. Previous colectomy. Vascular/Lymphatic: Normal caliber of the abdominal aorta. Small esophageal and perisplenic varices identified.No adenopathy identified. Other: Moderate volume of abdominal ascites. No discrete fluid collections identified. Musculoskeletal: No suspicious bone lesions identified. IMPRESSION: 1. Cirrhosis of the liver. 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 2. Moderate right and small left pleural effusions. 3. Previous colectomy with right abdominal ileostomy. Electronically Signed   By: Signa Kell M.D.   On: 10/18/2023 11:35   US Abdomen Limited RUQ (LIVER/GB) Result Date:  10/17/2023 CLINICAL DATA:  Cirrhosis and HCC screening. EXAM: ULTRASOUND ABDOMEN LIMITED RIGHT UPPER QUADRANT COMPARISON:  CT chest abdomen pelvis 09/19/2023 FINDINGS: Gallbladder: Surgically removed Common bile duct: Diameter: 0.6 cm Liver: Liver has a nodular contour and compatible with cirrhosis. No discrete liver lesion. Perihepatic ascites. Hepatopetal flow in the main portal vein. Other: Right pleural effusion. IMPRESSION: 1. Cirrhotic liver without a discrete liver lesion. 2. Ascites. 3. Right pleural effusion. Electronically Signed   By: Richarda Overlie M.D.   On: 10/17/2023 19:24   CT ABDOMEN PELVIS W CONTRAST Result Date: 10/17/2023 CLINICAL DATA:  Liver failure.  Jaundice. EXAM: CT ABDOMEN AND PELVIS WITH CONTRAST TECHNIQUE: Multidetector CT imaging of the abdomen and pelvis was performed using the standard protocol following bolus administration of intravenous contrast. RADIATION DOSE REDUCTION: This exam was performed according to the departmental dose-optimization program which includes automated exposure control, adjustment of the mA and/or kV according to patient size and/or use of iterative reconstruction technique. CONTRAST:  OMNIPAQUE IOHEXOL 300 MG/ML  SOLN COMPARISON:  09/19/2023 FINDINGS: Lower chest: Bilateral pleural effusions. Right pleural effusion is larger than left. Hepatobiliary: Liver is small to mildly nodular contour. Findings are suggestive for cirrhosis. Gallbladder is surgically absent. Main portal veins are patent. No biliary dilatation. Pancreas: Unremarkable. No pancreatic ductal dilatation or surrounding inflammatory changes. Spleen: Spleen is mildly prominent for size but stable. No focal abnormality. Adrenals/Urinary Tract: Normal adrenal glands. Small right renal cysts. No hydronephrosis. Normal appearance of the urinary bladder. Stomach/Bowel: Possible small hiatal hernia. Small esophageal varices. No acute abnormality involving the stomach. Evidence for colectomy and  right abdominal ileostomy. No evidence  for bowel dilatation or obstruction. Diffuse mesenteric edema is again noted. Vascular/Lymphatic: Atherosclerotic calcifications involving the abdominal aorta and iliac arteries without aneurysm. Portal venous system is patent. Evidence for small esophageal varices. IVC and renal veins are patent. No gross abnormality to the iliac veins. No significant lymph node enlargement in the abdomen or pelvis. Reproductive: Prostate is unremarkable. Other: Chronic low-density material in the presacral region that is not significantly changed. This low-density soft tissue roughly measures 4.8 x 2.2 cm image 67/2 and findings are most compatible with postoperative changes. Diffuse mesenteric edema. Again noted is abdominal ascites particularly around the liver. Subcutaneous edema. Musculoskeletal: No acute bone abnormality. IMPRESSION: 1. No acute abnormality in the abdomen or pelvis. 2. Persistent bilateral pleural effusions, right side larger than left. 3. Stable appearance of the liver without biliary dilatation. 4. Liver is small with evidence of cirrhosis. Evidence for portal hypertension demonstrated by ascites, mild splenomegaly and esophageal varices. 5. Persistent mesenteric edema and subcutaneous edema. 6. Status post colectomy with end ileostomy. Chronic postoperative changes in the presacral space. Electronically Signed   By: Richarda Overlie M.D.   On: 10/17/2023 19:21   DG Chest 2 View Result Date: 10/17/2023 CLINICAL DATA:  prior thora, now w jaundice.  Former smoker. EXAM: CHEST - 2 VIEW COMPARISON:  09/24/2023. FINDINGS: Mild diffuse interstitial prominence. It is uncertain whether this represents artifactual "crowding" of the interstitium related to suboptimal inspiration versus true infiltrate. Bilateral lung fields are otherwise clear. No acute consolidation or lung collapse. There is blunting of bilateral posterior costophrenic angles, suggesting bilateral small pleural  effusions. Stable cardio-mediastinal silhouette. No acute osseous abnormalities. The soft tissues are within normal limits. IMPRESSION: *Mild diffuse interstitial prominence, as discussed above. *Bilateral small pleural effusions. Electronically Signed   By: Jules Schick M.D.   On: 10/17/2023 16:59    Assessment  83 y.o. male very pleasant male with a history of anemia, atrial fibrillation, stroke, PE, T2DM, hypothyroidism, colon cancer s/p status post low anterior resection and APR with removal of rest of colon and with permanent ileostomy, cirrhosis, bacteremia in Oct 2024 s/p IV daptomycin completion Oct 07, 2023, now admitted with acute hepatitis and concern for impending liver failure in setting of decompensated cirrhosis.   Decompensated cirrhosis complicated by varices, ascites: etiology of cirrhosis unclear, prior biopsy March 2024 without AIH despite serologies with weakly positive ASMA and slight elevation of IgG. Portal vein present on [place], MRCP without obstruction or concerning lesions, AFP tumor marker normal, acute hepatitis panel all negative, iron sats and ferritin elevated although suspect this is r/t reactive process but hemochromatosos DNA pending, multiple other viral serologies pending (EBV, HSV, CMV). Suspect recent sepsis precipitating decompensation. Less likely DILI. Appreciate palliative consultation. MELD score pending INR results. He remains without signs/symptoms of encephalopathy.   Coagulopathy: INR remains in process this morning. Vit J 10 mg IV received past 3 days. Unlikely additional Vit K will be helpful in light of current status.   AKI on CKD: worsening creatinine. 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 Family would like Nephrology consultation. This can also help assist with goals of care as clinical picture unfolds.   Appreciate palliative consultation pending. Prognosis is guarded. Will consult Nephrology at family request.         Plan / Recommendations  Follow  pending INR Continue daily MELD labs Follow-up on pending additional serologies Appreciate palliative consultation pending 3 days total  (through 4/1) of N-acetylcysteine Likely will give another dose of Vit K: INR pending. The  lab has had to re-run this.  Nephrology consult due to concern for HRS: I have reached out to Dr. Arrie Aran, who will see patient tomorrow, 4/1.     LOS: 4 days    11/07/2023, 9:43 AM  Gelene Mink, PhD, ANP-BC [facility]    ADDENDUM: INR 4.2. This was ran twice per lab to verify. Giving additional VIt K 10 mg IVX1. MELD 3.0 is 42. Appreciate palliative care consultation with this nice gentleman.  Gelene Mink, PhD, ANP-BC [facility]

## 2023-11-08 DIAGNOSIS — E039 Hypothyroidism, unspecified: Secondary | ICD-10-CM | POA: Diagnosis not present

## 2023-11-08 DIAGNOSIS — K7290 Hepatic failure, unspecified without coma: Secondary | ICD-10-CM | POA: Diagnosis not present

## 2023-11-08 DIAGNOSIS — Z515 Encounter for palliative care: Secondary | ICD-10-CM | POA: Diagnosis not present

## 2023-11-08 DIAGNOSIS — K7460 Unspecified cirrhosis of liver: Secondary | ICD-10-CM | POA: Diagnosis not present

## 2023-11-08 LAB — CBC
HCT: 22 % — ABNORMAL LOW (ref 39.0–52.0)
Hemoglobin: 7.7 g/dL — ABNORMAL LOW (ref 13.0–17.0)
MCH: 38.5 pg — ABNORMAL HIGH (ref 26.0–34.0)
MCHC: 35 g/dL (ref 30.0–36.0)
MCV: 110 fL — ABNORMAL HIGH (ref 80.0–100.0)
Platelets: 48 10*3/uL — ABNORMAL LOW (ref 150–400)
RBC: 2 MIL/uL — ABNORMAL LOW (ref 4.22–5.81)
RDW: 16.4 % — ABNORMAL HIGH (ref 11.5–15.5)
WBC: 12.5 10*3/uL — ABNORMAL HIGH (ref 4.0–10.5)
nRBC: 0 % (ref 0.0–0.2)

## 2023-11-08 LAB — COMPREHENSIVE METABOLIC PANEL WITH GFR
ALT: 59 U/L — ABNORMAL HIGH (ref 0–44)
ALT: 53 U/L — ABNORMAL HIGH (ref 0–44)
AST: 71 U/L — ABNORMAL HIGH (ref 15–41)
AST: 64 U/L — ABNORMAL HIGH (ref 15–41)
Albumin: 3.2 g/dL — ABNORMAL LOW (ref 3.5–5.0)
Albumin: 3.4 g/dL — ABNORMAL LOW (ref 3.5–5.0)
Alkaline Phosphatase: 78 U/L (ref 38–126)
Alkaline Phosphatase: 62 U/L (ref 38–126)
Anion gap: 11 (ref 5–15)
Anion gap: 17 — ABNORMAL HIGH (ref 5–15)
BUN: 43 mg/dL — ABNORMAL HIGH (ref 8–23)
BUN: 54 mg/dL — ABNORMAL HIGH (ref 8–23)
CO2: 19 mmol/L — ABNORMAL LOW (ref 22–32)
CO2: 17 mmol/L — ABNORMAL LOW (ref 22–32)
Calcium: 9.5 mg/dL (ref 8.9–10.3)
Calcium: 9.5 mg/dL (ref 8.9–10.3)
Chloride: 100 mmol/L (ref 98–111)
Chloride: 97 mmol/L — ABNORMAL LOW (ref 98–111)
Creatinine, Ser: 1.95 mg/dL — ABNORMAL HIGH (ref 0.61–1.24)
Creatinine, Ser: 2.36 mg/dL — ABNORMAL HIGH (ref 0.61–1.24)
GFR, Estimated: 34 mL/min — ABNORMAL LOW (ref >60)
GFR, Estimated: 27 mL/min — ABNORMAL LOW (ref >60)
Glucose, Bld: 126 mg/dL — ABNORMAL HIGH (ref 70–99)
Glucose, Bld: 116 mg/dL — ABNORMAL HIGH (ref 70–99)
Potassium: 4.9 mmol/L (ref 3.5–5.1)
Potassium: 5 mmol/L (ref 3.5–5.1)
Sodium: 130 mmol/L — ABNORMAL LOW (ref 135–145)
Sodium: 131 mmol/L — ABNORMAL LOW (ref 135–145)
Total Bilirubin: 32.3 mg/dL (ref 0.0–1.2)
Total Bilirubin: 34.8 mg/dL (ref 0.0–1.2)
Total Protein: 5.9 g/dL — ABNORMAL LOW (ref 6.5–8.1)
Total Protein: 5.9 g/dL — ABNORMAL LOW (ref 6.5–8.1)

## 2023-11-08 LAB — CULTURE, BLOOD (ROUTINE X 2)
Culture: NO GROWTH
Culture: NO GROWTH
Special Requests: ADEQUATE

## 2023-11-08 LAB — PROTIME-INR
INR: 4.7 (ref 0.8–1.2)
Prothrombin Time: 44.4 s — ABNORMAL HIGH (ref 11.4–15.2)

## 2023-11-08 LAB — EBV AB TO VIRAL CAPSID AG PNL, IGG+IGM
EBV VCA IgG: >600 U/mL — ABNORMAL HIGH (ref 0.0–17.9)
EBV VCA IgM: 79.9 U/mL — ABNORMAL HIGH (ref 0.0–35.9)

## 2023-11-08 LAB — CMV IGM: CMV IgM: <30 [AU]/ml (ref 0.0–29.9)

## 2023-11-08 LAB — CYTOLOGY - NON PAP

## 2023-11-08 LAB — GLUCOSE, CAPILLARY: Glucose-Capillary: 103 mg/dL — ABNORMAL HIGH (ref 70–99)

## 2023-11-08 MED ORDER — ONDANSETRON 4 MG PO TBDP: 4.0000 mg | ORAL_TABLET | Freq: Three times a day (TID) | ORAL | 1 refills | Status: DC | PRN

## 2023-11-08 MED ORDER — OXYCODONE HCL ER 10 MG PO T12A: 10.0000 mg | EXTENDED_RELEASE_TABLET | Freq: Two times a day (BID) | ORAL | 0 refills | Status: DC

## 2023-11-08 MED ORDER — MIDODRINE HCL 5 MG PO TABS: 5.0000 mg | ORAL_TABLET | Freq: Three times a day (TID) | ORAL | 1 refills | Status: DC

## 2023-11-08 MED ORDER — OXYCODONE HCL 5 MG PO TABS: 5.0000 mg | ORAL_TABLET | Freq: Four times a day (QID) | ORAL | 0 refills | Status: DC | PRN

## 2023-11-08 MED ORDER — SODIUM ZIRCONIUM CYCLOSILICATE 10 G PO PACK: 10.0000 g | PACK | Freq: Two times a day (BID) | ORAL | 0 refills | Status: DC

## 2023-11-08 MED ORDER — VITAMIN K1 10 MG/ML IJ SOLN
10.0000 mg | Freq: Once | INTRAVENOUS | Status: AC
  Administered 2023-11-08: 10 mg via INTRAVENOUS
  Filled 2023-11-08: qty 1

## 2023-11-08 NOTE — Plan of Care (Signed)
  Problem: Health Behavior/Discharge Planning: Goal: Ability to manage health-related needs will improve Outcome: Progressing   Problem: Clinical Measurements: Goal: Ability to maintain clinical measurements within normal limits will improve Outcome: Progressing Goal: Will remain free from infection Outcome: Progressing Goal: Diagnostic test results will improve Outcome: Progressing   Problem: Activity: Goal: Risk for activity intolerance will decrease Outcome: Progressing   Problem: Nutrition: Goal: Adequate nutrition will be maintained Outcome: Progressing   Problem: Pain Managment: Goal: General experience of comfort will improve and/or be controlled Outcome: Adequate for Discharge   Problem: Safety: Goal: Ability to remain free from injury will improve Outcome: Progressing

## 2023-11-08 NOTE — TOC Transition Note (Signed)
 Transition of Care [facility]) - Discharge Note   Patient Details  Name: Jose Lawrence MRN: [id number] Date of Birth: 1940-12-28  [facility] CM/SW Contact:  Leitha Bleak, RN Phone Number: 11/08/2023, 10:31 AM   Clinical Narrative:   Patient discharging home with [facility]. CM updated Nathanial Rancher and faxed DC summary to [phone].    Final next level of care: Home w Hospice Care Barriers to Discharge: Barriers Resolved   Patient Goals and CMS Choice Patient states their goals for this hospitalization and ongoing recovery are:: To get home.     Discharge Placement           Patient and family notified of of transfer: 11/08/23  Discharge Plan and Services Additional resources added to the After Visit Summary for   In-house Referral: Clinical Social Work Discharge Planning Services: CM Consult Post Acute Care Choice: Hospice                Social Drivers of Health (SDOH) Interventions SDOH Screenings   Food Insecurity: No Food Insecurity (11/03/2023)  Housing: Low Risk  (11/03/2023)  Transportation Needs: No Transportation Needs (11/03/2023)  Utilities: Not At Risk (11/03/2023)  Alcohol Screen: Low Risk  (09/01/2020)  Depression (PHQ2-9): Low Risk  (09/01/2020)  Financial Resource Strain: Low Risk  (09/01/2020)  Physical Activity: Sufficiently Active (09/01/2020)  Social Connections: Socially Integrated (11/03/2023)  Stress: No Stress Concern Present (09/01/2020)  Tobacco Use: Medium Risk (11/07/2023)     Readmission Risk Interventions    11/03/2023    8:45 PM 10/18/2023   11:41 AM 09/26/2023   11:29 AM  Readmission Risk Prevention Plan  Transportation Screening Complete Complete   PCP or Specialist Appt within 5-7 Days   Complete  Home Care Screening   Complete  Medication Review (RN CM)   Complete  HRI or Home Care Consult  Complete   Social Work Consult for Recovery Care Planning/Counseling  Complete   Palliative Care Screening   Not Applicable   Medication Review Oceanographer) Complete Complete   PCP or Specialist appointment within 3-5 days of discharge Complete    HRI or Home Care Consult Complete    SW Recovery Care/Counseling Consult Complete    Palliative Care Screening Not Applicable    Skilled Nursing Facility Not Applicable

## 2023-11-08 NOTE — Discharge Summary (Signed)
 Physician Discharge Summary  Jose Lawrence:[id number] DOB: 1940-11-03 DOA: 11/03/2023  PCP: Ignatius Specking, MD GI: [facility]   Admit date: 11/03/2023 Discharge date: 11/08/2023   Disposition:  Home with Hospice care  Recommendations for Outpatient Follow-up:  SYMPTOM MANAGEMENT PER HOSPICE PROTOCOL  CALL HOSPICE NURSE FIRST BEFORE CALLING 911 OR RETURNING TO ER  Discharge Condition: HOSPICE / terminal illness   CODE STATUS: DNR DIET: comfort as tolerated    Brief Hospitalization Summary: Please see all hospital notes, images, labs for full details of the hospitalization. Admission provider HPI: 83 y.o. male with medical history significant of pulmonary embolism, DM II, BPH, hypothyroidism, colon Ca s/p resection/colostomy (follows Dr Ellin Saba) and liver cirrhosis, prior strep agalactiae bacteremia in 05/2023, finished IV daptomycin 10/07/2023, recent hospitalization 3/10 until 3/11 for painless jaundice, CT and MRCP without obstructive or other acute biliary abnormality presented to ED sent from GI office given secondary to complaints for generalized weakness, poor appetite, not eating or drinking, lethargic with sleeping all day and skin has been more jaundiced.  Reports he has been holding on his walks because of his weakness, reports he is feeling tired all the time, has no appetite, did once last week, denies any chest pain, coffee-ground emesis, no blood with stools. -In ED his workup significant for sodium 122, potassium 5.2, chloride 94, glucose 155, creatinine is up to 1.89 from 0.85, albumin low at 1.8, alk phos elevated at 141, total bilirubin significantly elevated at 29.3, was 10.4 on 3/11, white blood cell count elevated at 15, hemoglobin at 11.6, from baseline 9.1 3/11, platelet count low at 79K, INR is pending, ultrasound is pending, [facility] hospitalist consulted to admit.   Hospital Course by listed problems     Decompensated liver failure/ Transaminitis /   Coagulopathy Hypoalbuminemia / Hyperbilirubinemia/ Jaundice   -Monitoring LFTs, no significant improvement-bilirubin still elevated -GI following closely -pt has been treated with octreotide and IV acetylcysteine per GI team -Ammonia within normal limit, no indication for lactulose -was volume depleted temporarily holding Aldactone, and Lasix -Trend vitals, holding home medication of Coreg  -Avoid hepatotoxic medications -Due to elevated INR vitamin K 10 mg given for coagulopathy per GI team -discussed with Dr. Marletta Lor, he spoke with hepatologist at tertiary care center, there is nothing further to offer and palliative medicine consultation was recommended. Palliative medicine consulted.  -pt decided on DNR after speaking with GI and RN on 3/30 and decided to discharge home with hospice care on 11/08/23 - pt is terminally ill and final wishes are to return home     Ascites: -Upper quadrant ultrasound -positive abdominal fluid-ascites s/p paracentesis yielding 1 L   Transaminitis -Due to decompensated liver failure -No Signs of overt infection, afebrile -UA negative for leukocyte esterase, rare bacteria, WBC 0-5 -Follow-up with blood cultures: NGTD       Latest Ref Rng & Units 11/06/2023    4:43 AM 11/05/2023    4:30 AM 11/04/2023    4:26 AM  Hepatic Function  Total Protein 6.5 - 8.1 g/dL 5.7  5.7  5.7   Albumin 3.5 - 5.0 g/dL 2.7  2.5  2.2   AST 15 - 41 U/L 92  104  104   ALT 0 - 44 U/L 70  73  73   Alk Phosphatase 38 - 126 U/L 85  95  101   Total Bilirubin 0.0 - 1.2 mg/dL 08.6  57.8  46.9     -For jaundice-hyperbilirubinemia -reducing rate of IV fluid -  Hepatitis panel nonreactive -INR 2.8, 3.6, 3.1, 4.2   AKI and CKD stage III unspecified Concern for hepatorenal syndrome -POA-dehydrated, continue gentle IV fluid hydration with albumin -Concern for hepatorenal syndrome -With IV fluid and ascites concerning for third spacing -UA with signs of section UA gravity 1.017 -Hold  Aldactone and Lasix.  And Coreg -creatinine bumping today and that worries me that he is developing HRS -pt is terminally ill and discharging home with hospice   Hyponatremia -Sodium 122>>129 -Most likely due to volume depletion and dehydration -Lasix and Aldactone -on hold -Urine sodium - at 60 -Continue with IVF -sodium supplements   Hypotensive - improved   -Due to dehydration, acute liver failure -Added midodrine for BP support    hyperkalemia -was given Lokelma, hold Aldactone -lokelma BID ordered -- followed labs    Thrombocytopenia -Lower than baseline, SCD for DVT prophylaxis -Platelets 53, 52,   Diabetes mellitus, type II - diet controlled  Hypothyroidism -Continue Synthroid   Back pain/abdominal pain  -pain management ordered    Ethics-pt is now DNR and comfort care, discharging home with hospice   Discharge Diagnoses:  Principal Problem:   Liver failure (HCC) Active Problems:   Acquired hypothyroidism   BPH (benign prostatic hyperplasia)   Acute renal failure 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)   Elevated LFTs   Autoimmune hepatitis (HCC)   Decompensated cirrhosis (HCC)   Jaundice   Chronic idiopathic thrombocytopenia (HCC)   Acute hepatitis   Coagulopathy (HCC)   Portal hypertension with esophageal varices (HCC)   Other ascites   Hospice care patient   Discharge Instructions:  Allergies as of 11/08/2023   No Known Allergies      Medication List     STOP taking these medications    carvedilol 3.125 MG tablet Commonly known as: COREG   furosemide 20 MG tablet Commonly known as: Lasix   lactose free nutrition Liqd   loperamide 2 MG capsule Commonly known as: IMODIUM   spironolactone 50 MG tablet Commonly known as: Aldactone   Vitamin D (Ergocalciferol) 1.25 MG (50000 UNIT) Caps capsule Commonly known as: DRISDOL       TAKE these medications    acidophilus Caps capsule Take 1 capsule by mouth daily.    levothyroxine 50 MCG tablet Commonly known as: SYNTHROID Take 50 mcg by mouth daily before breakfast.   midodrine 5 MG tablet Commonly known as: PROAMATINE Take 1 tablet (5 mg total) by mouth 3 (three) times daily with meals.   ondansetron 4 MG disintegrating tablet Commonly known as: ZOFRAN-ODT Take 1 tablet (4 mg total) by mouth every 8 (eight) hours as needed for nausea or vomiting.   oxyCODONE 10 mg 12 hr tablet Commonly known as: OXYCONTIN Take 1 tablet (10 mg total) by mouth every 12 (twelve) hours.   oxyCODONE 5 MG immediate release tablet Commonly known as: Oxy IR/ROXICODONE Take 1 tablet (5 mg total) by mouth every 6 (six) hours as needed for breakthrough pain.   sodium zirconium cyclosilicate 10 g Pack packet Commonly known as: LOKELMA Take 10 g by mouth 2 (two) times daily.   tamsulosin 0.4 MG Caps capsule Commonly known as: FLOMAX Take 1 capsule (0.4 mg total) by mouth daily after supper.        No Known Allergies Allergies as of 11/08/2023   No Known Allergies      Medication List     STOP taking these medications    carvedilol 3.125 MG tablet Commonly known as:  COREG   furosemide 20 MG tablet Commonly known as: Lasix   lactose free nutrition Liqd   loperamide 2 MG capsule Commonly known as: IMODIUM   spironolactone 50 MG tablet Commonly known as: Aldactone   Vitamin D (Ergocalciferol) 1.25 MG (50000 UNIT) Caps capsule Commonly known as: DRISDOL       TAKE these medications    acidophilus Caps capsule Take 1 capsule by mouth daily.   levothyroxine 50 MCG tablet Commonly known as: SYNTHROID Take 50 mcg by mouth daily before breakfast.   midodrine 5 MG tablet Commonly known as: PROAMATINE Take 1 tablet (5 mg total) by mouth 3 (three) times daily with meals.   ondansetron 4 MG disintegrating tablet Commonly known as: ZOFRAN-ODT Take 1 tablet (4 mg total) by mouth every 8 (eight) hours as needed for nausea or vomiting.    oxyCODONE 10 mg 12 hr tablet Commonly known as: OXYCONTIN Take 1 tablet (10 mg total) by mouth every 12 (twelve) hours.   oxyCODONE 5 MG immediate release tablet Commonly known as: Oxy IR/ROXICODONE Take 1 tablet (5 mg total) by mouth every 6 (six) hours as needed for breakthrough pain.   sodium zirconium cyclosilicate 10 g Pack packet Commonly known as: LOKELMA Take 10 g by mouth 2 (two) times daily.   tamsulosin 0.4 MG Caps capsule Commonly known as: FLOMAX Take 1 capsule (0.4 mg total) by mouth daily after supper.        Procedures/Studies: US Paracentesis Result Date: 11/04/2023 INDICATION: Patient with a history of cirrhosis admitted with hyperbilirubinemia and ascites. Interventional radiology asked to perform a diagnostic and therapeutic paracentesis with a 1 L max. EXAM: ULTRASOUND GUIDED PARACENTESIS MEDICATIONS: 1% lidocaine 10 mL COMPLICATIONS: None immediate. PROCEDURE: Informed written consent was obtained from the patient after a discussion of the risks, benefits and alternatives to treatment. A timeout was performed prior to the initiation of the procedure. Initial ultrasound scanning demonstrates a large amount of ascites within the right lower abdominal quadrant. The right lower abdomen was prepped and draped in the usual sterile fashion. 1% lidocaine was used for local anesthesia. Following this, a 19 gauge, 7-cm, Yueh catheter was introduced. An ultrasound image was saved for documentation purposes. The paracentesis was performed. The catheter was removed and a dressing was applied. The patient tolerated the procedure well without immediate post procedural complication. FINDINGS: A total of approximately 1 L of bright yellow fluid was removed. Samples were sent to the laboratory as requested by the clinical team. IMPRESSION: Successful ultrasound-guided paracentesis yielding 1 liters of peritoneal fluid. PLAN: If the patient eventually requires >/=2 paracenteses in a 30 day  period, candidacy for formal evaluation by the [facility] Inter[facility] will be assessed. Electronically Signed   By: Marliss Coots M.D.   On: 11/04/2023 14:56   US ABDOMEN LIMITED WITH LIVER DOPPLER Result Date: 11/03/2023 CLINICAL DATA:  Right upper quadrant pain EXAM: DUPLEX ULTRASOUND OF LIVER TECHNIQUE: Color and duplex Doppler ultrasound was performed to evaluate the hepatic in-flow and out-flow vessels. COMPARISON:  MRI from 10/18/2023 FINDINGS: Liver: Increase in echogenicity with mild nodularity and significant Peri hepatic ascites. No focal lesion, mass or intrahepatic biliary ductal dilatation. Main Portal Vein size: 1 cm cm Portal Vein Velocities: All portal vein branches are noted to be hepatopetal inflow. Main Prox:  22 cm/sec Main Mid: 17 cm/sec Main Dist:  8.7 cm/sec Right: 23.4 cm/sec Left: 19.2 cm/sec Hepatic Vein Velocities: All hepatic vein branches are noted to be hepatofugal in  flow. Right:  33.7 cm/sec Middle:  35.4 cm/sec Left:  31.7 cm/sec IVC: Present and patent with normal respiratory phasicity. Hepatic Artery Velocity:  126.5 cm/sec Splenic Vein Velocity:  27.3 cm/sec Spleen: 11.6 cm x 4.5 cm x 11.0 cm with a total volume of 363 cm^3 (411 cm^3 is upper limit normal) Portal Vein Occlusion/Thrombus: No Splenic Vein Occlusion/Thrombus: No Ascites: Present Varices: Absent IMPRESSION: Moderate ascites is noted. Cirrhotic changes of liver are seen. No portal vein occlusion is seen. No significant hip paddle few both flow in the portal venous branches is noted. Electronically Signed   By: Alcide Clever M.D.   On: 11/03/2023 19:36   DG Chest Portable 1 View Result Date: 11/03/2023 CLINICAL DATA:  Fatigue EXAM: PORTABLE CHEST 1 VIEW COMPARISON:  10/17/2023 FINDINGS: Stable cardiomediastinal contours. Decreased bilateral pleural effusions with small residual effusion on the right. Mild right basilar atelectasis. No pneumothorax. IMPRESSION: Decreased  bilateral pleural effusions with small residual effusion on the right. Electronically Signed   By: Duanne Guess D.O.   On: 11/03/2023 14:17   MR ABDOMEN MRCP W WO CONTAST Result Date: 10/18/2023 CLINICAL DATA:  Jaundice.  History of colon cancer. EXAM: MRI ABDOMEN WITHOUT AND WITH CONTRAST (INCLUDING MRCP) TECHNIQUE: Multiplanar multisequence MR imaging of the abdomen was performed both before and after the administration of intravenous contrast. Heavily T2-weighted images of the biliary and pancreatic ducts were obtained, and three-dimensional MRCP images were rendered by post processing. CONTRAST:  7mL GADAVIST GADOBUTROL 1 MMOL/ML IV SOLN COMPARISON:  CT AP from 10/17/2023 FINDINGS: Lower chest: Moderate right and small left pleural effusions. Hepatobiliary: The liver is small with a diffusely nodular contour compatible with cirrhosis. No enhancing liver lesions identified following contrast administration. The gallbladder is surgically absent. Although the MRCP images are nondiagnostic due to motion degradation, there is no intrahepatic or extrahepatic bile duct dilatation. Common bile duct is within normal limits measuring 5 mm. Pancreas:  No signs of main duct dilatation or mass. Spleen:  Normal in size no focal abnormality. Adrenals/Urinary Tract: Normal adrenal glands. There are 2 subcentimeter T2 hyperintense cyst arising off the lateral cortex of the right kidney measuring up to 6 mm. These are compatible with benign Bosniak class 2 cyst. No follow-up imaging recommended. Stomach/Bowel: Stomach appears normal. No pathologic dilatation of the large or small bowel loops. Right abdominal ileostomy. Previous colectomy. Vascular/Lymphatic: Normal caliber of the abdominal aorta. Small esophageal and perisplenic varices identified.No adenopathy identified. Other: Moderate volume of abdominal ascites. No discrete fluid collections identified. Musculoskeletal: No suspicious bone lesions identified.  IMPRESSION: 1. Cirrhosis of the liver. 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 2. Moderate right and small left pleural effusions. 3. Previous colectomy with right abdominal ileostomy. Electronically Signed   By: Signa Kell M.D.   On: 10/18/2023 11:35   MR 3D Recon At Scanner Result Date: 10/18/2023 CLINICAL DATA:  Jaundice.  History of colon cancer. EXAM: MRI ABDOMEN WITHOUT AND WITH CONTRAST (INCLUDING MRCP) TECHNIQUE: Multiplanar multisequence MR imaging of the abdomen was performed both before and after the administration of intravenous contrast. Heavily T2-weighted images of the biliary and pancreatic ducts were obtained, and three-dimensional MRCP images were rendered by post processing. CONTRAST:  7mL GADAVIST GADOBUTROL 1 MMOL/ML IV SOLN COMPARISON:  CT AP from 10/17/2023 FINDINGS: Lower chest: Moderate right and small left pleural effusions. Hepatobiliary: The liver is small with a diffusely nodular contour compatible with cirrhosis. No enhancing liver lesions identified following contrast administration. The gallbladder is  surgically absent. Although the MRCP images are nondiagnostic due to motion degradation, there is no intrahepatic or extrahepatic bile duct dilatation. Common bile duct is within normal limits measuring 5 mm. Pancreas:  No signs of main duct dilatation or mass. Spleen:  Normal in size no focal abnormality. Adrenals/Urinary Tract: Normal adrenal glands. There are 2 subcentimeter T2 hyperintense cyst arising off the lateral cortex of the right kidney measuring up to 6 mm. These are compatible with benign Bosniak class 2 cyst. No follow-up imaging recommended. Stomach/Bowel: Stomach appears normal. No pathologic dilatation of the large or small bowel loops. Right abdominal ileostomy. Previous colectomy. Vascular/Lymphatic: Normal caliber of the abdominal aorta. Small esophageal and perisplenic varices identified.No adenopathy identified. Other:  Moderate volume of abdominal ascites. No discrete fluid collections identified. Musculoskeletal: No suspicious bone lesions identified. IMPRESSION: 1. Cirrhosis of the liver. 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 2. Moderate right and small left pleural effusions. 3. Previous colectomy with right abdominal ileostomy. Electronically Signed   By: Signa Kell M.D.   On: 10/18/2023 11:35   US Abdomen Limited RUQ (LIVER/GB) Result Date: 10/17/2023 CLINICAL DATA:  Cirrhosis and HCC screening. EXAM: ULTRASOUND ABDOMEN LIMITED RIGHT UPPER QUADRANT COMPARISON:  CT chest abdomen pelvis 09/19/2023 FINDINGS: Gallbladder: Surgically removed Common bile duct: Diameter: 0.6 cm Liver: Liver has a nodular contour and compatible with cirrhosis. No discrete liver lesion. Perihepatic ascites. Hepatopetal flow in the main portal vein. Other: Right pleural effusion. IMPRESSION: 1. Cirrhotic liver without a discrete liver lesion. 2. Ascites. 3. Right pleural effusion. Electronically Signed   By: Richarda Overlie M.D.   On: 10/17/2023 19:24   CT ABDOMEN PELVIS W CONTRAST Result Date: 10/17/2023 CLINICAL DATA:  Liver failure.  Jaundice. EXAM: CT ABDOMEN AND PELVIS WITH CONTRAST TECHNIQUE: Multidetector CT imaging of the abdomen and pelvis was performed using the standard protocol following bolus administration of intravenous contrast. RADIATION DOSE REDUCTION: This exam was performed according to the departmental dose-optimization program which includes automated exposure control, adjustment of the mA and/or kV according to patient size and/or use of iterative reconstruction technique. CONTRAST:  OMNIPAQUE IOHEXOL 300 MG/ML  SOLN COMPARISON:  09/19/2023 FINDINGS: Lower chest: Bilateral pleural effusions. Right pleural effusion is larger than left. Hepatobiliary: Liver is small to mildly nodular contour. Findings are suggestive for cirrhosis. Gallbladder is surgically absent. Main portal  veins are patent. No biliary dilatation. Pancreas: Unremarkable. No pancreatic ductal dilatation or surrounding inflammatory changes. Spleen: Spleen is mildly prominent for size but stable. No focal abnormality. Adrenals/Urinary Tract: Normal adrenal glands. Small right renal cysts. No hydronephrosis. Normal appearance of the urinary bladder. Stomach/Bowel: Possible small hiatal hernia. Small esophageal varices. No acute abnormality involving the stomach. Evidence for colectomy and right abdominal ileostomy. No evidence for bowel dilatation or obstruction. Diffuse mesenteric edema is again noted. Vascular/Lymphatic: Atherosclerotic calcifications involving the abdominal aorta and iliac arteries without aneurysm. Portal venous system is patent. Evidence for small esophageal varices. IVC and renal veins are patent. No gross abnormality to the iliac veins. No significant lymph node enlargement in the abdomen or pelvis. Reproductive: Prostate is unremarkable. Other: Chronic low-density material in the presacral region that is not significantly changed. This low-density soft tissue roughly measures 4.8 x 2.2 cm image 67/2 and findings are most compatible with postoperative changes. Diffuse mesenteric edema. Again noted is abdominal ascites particularly around the liver. Subcutaneous edema. Musculoskeletal: No acute bone abnormality. IMPRESSION: 1. No acute abnormality in the abdomen or pelvis. 2. Persistent bilateral pleural  effusions, right side larger than left. 3. Stable appearance of the liver without biliary dilatation. 4. Liver is small with evidence of cirrhosis. Evidence for portal hypertension demonstrated by ascites, mild splenomegaly and esophageal varices. 5. Persistent mesenteric edema and subcutaneous edema. 6. Status post colectomy with end ileostomy. Chronic postoperative changes in the presacral space. Electronically Signed   By: Richarda Overlie M.D.   On: 10/17/2023 19:21   DG Chest 2 View Result Date:  10/17/2023 CLINICAL DATA:  prior thora, now w jaundice.  Former smoker. EXAM: CHEST - 2 VIEW COMPARISON:  09/24/2023. FINDINGS: Mild diffuse interstitial prominence. It is uncertain whether this represents artifactual "crowding" of the interstitium related to suboptimal inspiration versus true infiltrate. Bilateral lung fields are otherwise clear. No acute consolidation or lung collapse. There is blunting of bilateral posterior costophrenic angles, suggesting bilateral small pleural effusions. Stable cardio-mediastinal silhouette. No acute osseous abnormalities. The soft tissues are within normal limits. IMPRESSION: *Mild diffuse interstitial prominence, as discussed above. *Bilateral small pleural effusions. Electronically Signed   By: Jules Schick M.D.   On: 10/17/2023 16:59     Subjective: Pt says he is ready to go home as soon as we can get him discharged.  He has no pain or discomfort or severe itching at this time.    Discharge Exam: Vitals:   11/07/23 2036 11/08/23 0452  BP: (!) 130/49 (!) 146/71  Pulse: 74 92  Resp: 16 20  Temp: 98.6 F (37 C) 98 F (36.7 C)  SpO2: 94% 100%   Vitals:   11/07/23 0459 11/07/23 1430 11/07/23 2036 11/08/23 0452  BP: 130/61 (!) 111/55 (!) 130/49 (!) 146/71  Pulse: 89 82 74 92  Resp: 16 16 16 20   Temp: 98.3 F (36.8 C) 98.6 F (37 C) 98.6 F (37 C) 98 F (36.7 C)  TempSrc: Oral Oral Oral Oral  SpO2: 94% 93% 94% 100%  Weight:      Height:       General: Pt is alert, awake, not in acute distress, he is severely jaundiced; hard of hearing. Cardiovascular: normal S1/S2 +, no rubs, no gallops Respiratory: CTA bilaterally, no wheezing, no rhonchi Abdominal: Soft, NT, ND, bowel sounds + Extremities: 1+ edema, no cyanosis   The results of significant diagnostics from this hospitalization (including imaging, microbiology, ancillary and laboratory) are listed below for reference.     Microbiology: Recent Results (from the past 240 hours)  Blood  culture (routine x 2)     Status: None   Collection Time: 11/03/23  4:14 PM   Specimen: BLOOD  Result Value Ref Range Status   Specimen Description BLOOD RIGHT ANTECUBITAL  Final   Special Requests   Final    BOTTLES DRAWN AEROBIC AND ANAEROBIC Blood Culture adequate volume   Culture   Final    NO GROWTH 5 DAYS Performed at [facility], [address], [place], [place] [address]    Report Status 11/08/2023 FINAL  Final  Blood culture (routine x 2)     Status: None   Collection Time: 11/03/23  4:14 PM   Specimen: BLOOD  Result Value Ref Range Status   Specimen Description BLOOD BOTTLES DRAWN AEROBIC ONLY  Final   Special Requests   Final    Blood Culture results may not be optimal due to an inadequate volume of blood received in culture bottles   Culture   Final    NO GROWTH 5 DAYS Performed at [facility], [address] [place], [place] [address]  Report Status 11/08/2023 FINAL  Final  Gram stain     Status: None   Collection Time: 11/04/23  2:20 PM   Specimen: Ascitic  Result Value Ref Range Status   Specimen Description ASCITIC  Final   Special Requests NONE  Final   Gram Stain   Final    PLEURAL NO ORGANISMS SEEN WBC PRESENT,BOTH PMN AND MONONUCLEAR CYTOSPIN SMEAR Performed at [facility], [address] [place], [place] [address]    Report Status 11/04/2023 FINAL  Final  Culture, body fluid w Gram Stain-bottle     Status: None (Preliminary result)   Collection Time: 11/04/23  2:20 PM   Specimen: Ascitic  Result Value Ref Range Status   Specimen Description ASCITIC  Final   Special Requests BOTTLES DRAWN AEROBIC AND ANAEROBIC 10CC  Final   Culture   Final    NO GROWTH 4 DAYS Performed at [facility], [address], [place], [place] [address]    Report Status PENDING  Incomplete  Resp panel by RT-PCR (RSV, Flu A&B, Covid) Anterior Nasal Swab     Status: None   Collection Time: 11/05/23  1:09 PM   Specimen: Anterior Nasal Swab  Result Value Ref Range Status    SARS Coronavirus 2 by RT PCR NEGATIVE NEGATIVE Final    Comment: (NOTE) SARS-CoV-2 target nucleic acids are NOT DETECTED.  The SARS-CoV-2 RNA is generally detectable in upper respiratory specimens during the acute phase of infection. The lowest concentration of SARS-CoV-2 viral copies this assay can detect is 138 copies/mL. A negative result does not preclude SARS-Cov-2 infection and should not be used as the sole basis for treatment or other patient management decisions. A negative result may occur with  improper specimen collection/handling, submission of specimen other than nasopharyngeal swab, presence of viral mutation(s) within the areas targeted by this assay, and inadequate number of viral copies(<138 copies/mL). A negative result must be combined with clinical observations, patient history, and epidemiological information. The expected result is Negative.  Fact Sheet for Patients:  BloggerCourse.com  Fact Sheet for Healthcare Providers:  SeriousBroker.it  This test is no t yet approved or cleared by the [place] FDA and  has been authorized for detection and/or diagnosis of SARS-CoV-2 by FDA under an Emergency Use Authorization (EUA). This EUA will remain  in effect (meaning this test can be used) for the duration of the COVID-19 declaration under Section 564(b)(1) of the Act, 21 U.S.C.section 360bbb-3(b)(1), unless the authorization is terminated  or revoked sooner.       Influenza A by PCR NEGATIVE NEGATIVE Final   Influenza B by PCR NEGATIVE NEGATIVE Final    Comment: (NOTE) The Xpert Xpress SARS-CoV-2/FLU/RSV plus assay is intended as an aid in the diagnosis of influenza from Nasopharyngeal swab specimens and should not be used as a sole basis for treatment. Nasal washings and aspirates are unacceptable for Xpert Xpress SARS-CoV-2/FLU/RSV testing.  Fact Sheet for  Patients: BloggerCourse.com  Fact Sheet for Healthcare Providers: SeriousBroker.it  This test is not yet approved or cleared by the [place] FDA and has been authorized for detection and/or diagnosis of SARS-CoV-2 by FDA under an Emergency Use Authorization (EUA). This EUA will remain in effect (meaning this test can be used) for the duration of the COVID-19 declaration under Section 564(b)(1) of the Act, 21 U.S.C. section 360bbb-3(b)(1), unless the authorization is terminated or revoked.     Resp Syncytial Virus by PCR NEGATIVE NEGATIVE Final    Comment: (NOTE) Fact Sheet  for Patients: BloggerCourse.com  Fact Sheet for Healthcare Providers: SeriousBroker.it  This test is not yet approved or cleared by the [place] FDA and has been authorized for detection and/or diagnosis of SARS-CoV-2 by FDA under an Emergency Use Authorization (EUA). This EUA will remain in effect (meaning this test can be used) for the duration of the COVID-19 declaration under Section 564(b)(1) of the Act, 21 U.S.C. section 360bbb-3(b)(1), unless the authorization is terminated or revoked.  Performed at [facility], [address] [place], [place] [address]      Labs: BNP (last 3 results) Recent Labs    10/17/23 1418 11/03/23 1302  BNP 163.0* 187.0*   Basic Metabolic Panel: Recent Labs  Lab 11/04/23 0426 11/05/23 0430 11/06/23 0443 11/07/23 0439 11/08/23 0450  NA 126* 126* 129* 130* 131*  K 5.2* 5.0 5.1 4.9 5.0  CL 101 100 98 100 97*  CO2 22 21* 20* 19* 17*  GLUCOSE 94 120* 132* 126* 116*  BUN 36* 32* 34* 43* 54*  CREATININE 1.83* 1.53* 1.50* 1.95* 2.36*  CALCIUM 8.5* 8.6* 9.0 9.5 9.5   Liver Function Tests: Recent Labs  Lab 11/04/23 0426 11/05/23 0430 11/06/23 0443 11/07/23 0439 11/08/23 0450  AST 104* 104* 92* 71* 64*  ALT 73* 73* 70* 59* 53*  ALKPHOS 101 95 85 78 62   BILITOT 26.7* 29.4* 32.1* 32.3* 34.8*  PROT 5.7* 5.7* 5.7* 5.9* 5.9*  ALBUMIN 2.2* 2.5* 2.7* 3.2* 3.4*   Recent Labs  Lab 11/03/23 1302  LIPASE 32   Recent Labs  Lab 11/03/23 1325  AMMONIA <10   CBC: Recent Labs  Lab 11/03/23 1302 11/04/23 0426 11/05/23 0430 11/06/23 0443 11/07/23 0439 11/08/23 0450  WBC 15.0* 8.1 9.3 11.8* 11.7* 12.5*  NEUTROABS 13.0*  --   --   --   --   --   HGB 11.6* 8.6* 8.8* 8.8* 8.1* 7.7*  HCT 33.0* 24.5* 24.9* 25.0* 23.9* 22.0*  MCV 107.8* 107.5* 107.8* 108.2* 110.1* 110.0*  PLT 79* 53* 52* 47* 47* 48*   Cardiac Enzymes: No results for input(s): "CKTOTAL", "CKMB", "CKMBINDEX", "TROPONINI" in the last 168 hours. BNP: Invalid input(s): "POCBNP" CBG: Recent Labs  Lab 11/06/23 2019 11/07/23 0730 11/07/23 1059 11/07/23 1629 11/08/23 0713  GLUCAP 162* 110* 117* 121* 103*   D-Dimer No results for input(s): "DDIMER" in the last 72 hours. Hgb A1c No results for input(s): "HGBA1C" in the last 72 hours. Lipid Profile No results for input(s): "CHOL", "HDL", "LDLCALC", "TRIG", "CHOLHDL", "LDLDIRECT" in the last 72 hours. Thyroid function studies No results for input(s): "TSH", "T4TOTAL", "T3FREE", "THYROIDAB" in the last 72 hours.  Invalid input(s): "FREET3" Anemia work up No results for input(s): "VITAMINB12", "FOLATE", "FERRITIN", "TIBC", "IRON", "RETICCTPCT" in the last 72 hours. Urinalysis    Component Value Date/Time   COLORURINE AMBER (A) 11/03/2023 1436   APPEARANCEUR HAZY (A) 11/03/2023 1436   APPEARANCEUR Clear 08/30/2023 1409   LABSPEC 1.017 11/03/2023 1436   PHURINE 5.0 11/03/2023 1436   GLUCOSEU 50 (A) 11/03/2023 1436   HGBUR SMALL (A) 11/03/2023 1436   BILIRUBINUR MODERATE (A) 11/03/2023 1436   BILIRUBINUR Negative 08/30/2023 1409   KETONESUR NEGATIVE 11/03/2023 1436   PROTEINUR NEGATIVE 11/03/2023 1436   NITRITE NEGATIVE 11/03/2023 1436   LEUKOCYTESUR NEGATIVE 11/03/2023 1436   Sepsis Labs Recent Labs  Lab  11/05/23 0430 11/06/23 0443 11/07/23 0439 11/08/23 0450  WBC 9.3 11.8* 11.7* 12.5*   Microbiology Recent Results (from the past 240 hours)  Blood culture (routine x  2)     Status: None   Collection Time: 11/03/23  4:14 PM   Specimen: BLOOD  Result Value Ref Range Status   Specimen Description BLOOD RIGHT ANTECUBITAL  Final   Special Requests   Final    BOTTLES DRAWN AEROBIC AND ANAEROBIC Blood Culture adequate volume   Culture   Final    NO GROWTH 5 DAYS Performed at [facility], [address], [place], [place] [address]    Report Status 11/08/2023 FINAL  Final  Blood culture (routine x 2)     Status: None   Collection Time: 11/03/23  4:14 PM   Specimen: BLOOD  Result Value Ref Range Status   Specimen Description BLOOD BOTTLES DRAWN AEROBIC ONLY  Final   Special Requests   Final    Blood Culture results may not be optimal due to an inadequate volume of blood received in culture bottles   Culture   Final    NO GROWTH 5 DAYS Performed at [facility], [address], [place], [place] [address]    Report Status 11/08/2023 FINAL  Final  Gram stain     Status: None   Collection Time: 11/04/23  2:20 PM   Specimen: Ascitic  Result Value Ref Range Status   Specimen Description ASCITIC  Final   Special Requests NONE  Final   Gram Stain   Final    PLEURAL NO ORGANISMS SEEN WBC PRESENT,BOTH PMN AND MONONUCLEAR CYTOSPIN SMEAR Performed at [facility], [address] [place], [place] [address]    Report Status 11/04/2023 FINAL  Final  Culture, body fluid w Gram Stain-bottle     Status: None (Preliminary result)   Collection Time: 11/04/23  2:20 PM   Specimen: Ascitic  Result Value Ref Range Status   Specimen Description ASCITIC  Final   Special Requests BOTTLES DRAWN AEROBIC AND ANAEROBIC 10CC  Final   Culture   Final    NO GROWTH 4 DAYS Performed at [facility], [address] [place], [place] [address]    Report Status PENDING  Incomplete  Resp panel by RT-PCR (RSV,  Flu A&B, Covid) Anterior Nasal Swab     Status: None   Collection Time: 11/05/23  1:09 PM   Specimen: Anterior Nasal Swab  Result Value Ref Range Status   SARS Coronavirus 2 by RT PCR NEGATIVE NEGATIVE Final    Comment: (NOTE) SARS-CoV-2 target nucleic acids are NOT DETECTED.  The SARS-CoV-2 RNA is generally detectable in upper respiratory specimens during the acute phase of infection. The lowest concentration of SARS-CoV-2 viral copies this assay can detect is 138 copies/mL. A negative result does not preclude SARS-Cov-2 infection and should not be used as the sole basis for treatment or other patient management decisions. A negative result may occur with  improper specimen collection/handling, submission of specimen other than nasopharyngeal swab, presence of viral mutation(s) within the areas targeted by this assay, and inadequate number of viral copies(<138 copies/mL). A negative result must be combined with clinical observations, patient history, and epidemiological information. The expected result is Negative.  Fact Sheet for Patients:  BloggerCourse.com  Fact Sheet for Healthcare Providers:  SeriousBroker.it  This test is no t yet approved or cleared by the [place] FDA and  has been authorized for detection and/or diagnosis of SARS-CoV-2 by FDA under an Emergency Use Authorization (EUA). This EUA will remain  in effect (meaning this test can be used) for the duration of the COVID-19 declaration under Section 564(b)(1) of the Act,  21 U.S.C.section 360bbb-3(b)(1), unless the authorization is terminated  or revoked sooner.       Influenza A by PCR NEGATIVE NEGATIVE Final   Influenza B by PCR NEGATIVE NEGATIVE Final    Comment: (NOTE) The Xpert Xpress SARS-CoV-2/FLU/RSV plus assay is intended as an aid in the diagnosis of influenza from Nasopharyngeal swab specimens and should not be used as a sole basis for  treatment. Nasal washings and aspirates are unacceptable for Xpert Xpress SARS-CoV-2/FLU/RSV testing.  Fact Sheet for Patients: BloggerCourse.com  Fact Sheet for Healthcare Providers: SeriousBroker.it  This test is not yet approved or cleared by the [place] FDA and has been authorized for detection and/or diagnosis of SARS-CoV-2 by FDA under an Emergency Use Authorization (EUA). This EUA will remain in effect (meaning this test can be used) for the duration of the COVID-19 declaration under Section 564(b)(1) of the Act, 21 U.S.C. section 360bbb-3(b)(1), unless the authorization is terminated or revoked.     Resp Syncytial Virus by PCR NEGATIVE NEGATIVE Final    Comment: (NOTE) Fact Sheet for Patients: BloggerCourse.com  Fact Sheet for Healthcare Providers: SeriousBroker.it  This test is not yet approved or cleared by the [place] FDA and has been authorized for detection and/or diagnosis of SARS-CoV-2 by FDA under an Emergency Use Authorization (EUA). This EUA will remain in effect (meaning this test can be used) for the duration of the COVID-19 declaration under Section 564(b)(1) of the Act, 21 U.S.C. section 360bbb-3(b)(1), unless the authorization is terminated or revoked.  Performed at [facility], [address] [place], [place] [address]    Time coordinating discharge: 42 mins  SIGNED:  Standley Dakins, MD  [facility] 11/08/2023, 10:16 AM How to contact the [facility] Attending or Consulting provider 7A - 7P or covering provider during after hours 7P -7A, for this patient?  Check the care team in [facility] and look for a) attending/consulting [facility] provider listed and b) the [facility] team listed Log into [email or web address] and use [facility] universal password to access. If you do not have the password, please contact the hospital operator. Locate the [facility] provider you  are looking for under [facility] and page to a number that you can be directly reached. If you still have difficulty reaching the provider, please page the [facility] (Director on Call) for the Hospitalists listed on amion for assistance.

## 2023-11-08 NOTE — Care Management Important Message (Signed)
 Important Message  Patient Details  Name: Jose Lawrence MRN: [id number] Date of Birth: October 22, 1940   Important Message Given:  No (discharging to hospice)     Corey Harold 11/08/2023, 10:52 AM

## 2023-11-09 LAB — CULTURE, BODY FLUID W GRAM STAIN -BOTTLE: Culture: NO GROWTH

## 2023-11-09 LAB — HEMOCHROMATOSIS DNA-PCR(C282Y,H63D)

## 2023-12-06 ENCOUNTER — Ambulatory Visit (INDEPENDENT_AMBULATORY_CARE_PROVIDER_SITE_OTHER): Admitting: Gastroenterology

## 2023-12-08 DEATH — deceased

## 2024-02-17 ENCOUNTER — Other Ambulatory Visit (INDEPENDENT_AMBULATORY_CARE_PROVIDER_SITE_OTHER): Payer: Self-pay

## 2024-02-17 DIAGNOSIS — K754 Autoimmune hepatitis: Secondary | ICD-10-CM

## 2024-02-17 DIAGNOSIS — K7460 Unspecified cirrhosis of liver: Secondary | ICD-10-CM

## 2024-02-17 DIAGNOSIS — K766 Portal hypertension: Secondary | ICD-10-CM

## 2024-02-17 DIAGNOSIS — K7200 Acute and subacute hepatic failure without coma: Secondary | ICD-10-CM

## 2024-02-17 DIAGNOSIS — B179 Acute viral hepatitis, unspecified: Secondary | ICD-10-CM

## 2024-03-20 ENCOUNTER — Other Ambulatory Visit: Payer: Self-pay | Admitting: *Deleted

## 2024-04-16 ENCOUNTER — Inpatient Hospital Stay: Payer: PRIVATE HEALTH INSURANCE

## 2024-04-23 ENCOUNTER — Inpatient Hospital Stay: Payer: PRIVATE HEALTH INSURANCE | Admitting: Hematology

## 2024-04-24 ENCOUNTER — Inpatient Hospital Stay: Payer: PRIVATE HEALTH INSURANCE | Admitting: Oncology

## 2024-09-04 ENCOUNTER — Ambulatory Visit: Payer: Medicare Other | Admitting: Urology
